# Patient Record
Sex: Female | Born: 1980 | Race: White | Hispanic: No | Marital: Single | Smoking: Never smoker
Health system: Southern US, Community
[De-identification: ages and names within clinical notes are randomized; demographics above are authoritative.]

## PROBLEM LIST (undated history)

## (undated) ENCOUNTER — Ambulatory Visit: Source: Home / Self Care

## (undated) DIAGNOSIS — E559 Vitamin D deficiency, unspecified: Secondary | ICD-10-CM

## (undated) DIAGNOSIS — R569 Unspecified convulsions: Secondary | ICD-10-CM

## (undated) DIAGNOSIS — E785 Hyperlipidemia, unspecified: Secondary | ICD-10-CM

## (undated) DIAGNOSIS — R29898 Other symptoms and signs involving the musculoskeletal system: Secondary | ICD-10-CM

## (undated) DIAGNOSIS — I10 Essential (primary) hypertension: Secondary | ICD-10-CM

## (undated) DIAGNOSIS — R443 Hallucinations, unspecified: Secondary | ICD-10-CM

## (undated) DIAGNOSIS — F209 Schizophrenia, unspecified: Secondary | ICD-10-CM

## (undated) DIAGNOSIS — K219 Gastro-esophageal reflux disease without esophagitis: Secondary | ICD-10-CM

## (undated) DIAGNOSIS — R454 Irritability and anger: Secondary | ICD-10-CM

## (undated) DIAGNOSIS — F70 Mild intellectual disabilities: Secondary | ICD-10-CM

## (undated) DIAGNOSIS — E119 Type 2 diabetes mellitus without complications: Secondary | ICD-10-CM

## (undated) HISTORY — DX: Gastro-esophageal reflux disease without esophagitis: K21.9

## (undated) HISTORY — PX: NO PAST SURGERIES: SHX2092

## (undated) HISTORY — DX: Hyperlipidemia, unspecified: E78.5

## (undated) HISTORY — DX: Vitamin D deficiency, unspecified: E55.9

---
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
## 2001-05-22 ENCOUNTER — Emergency Department (HOSPITAL_COMMUNITY): Admission: EM | Admit: 2001-05-22 | Discharge: 2001-05-22 | Payer: Self-pay | Admitting: Emergency Medicine

## 2001-08-22 ENCOUNTER — Emergency Department (HOSPITAL_COMMUNITY): Admission: EM | Admit: 2001-08-22 | Discharge: 2001-08-22 | Payer: Self-pay | Admitting: Emergency Medicine

## 2001-08-22 ENCOUNTER — Encounter: Payer: Self-pay | Admitting: Emergency Medicine

## 2001-11-30 ENCOUNTER — Emergency Department (HOSPITAL_COMMUNITY): Admission: EM | Admit: 2001-11-30 | Discharge: 2001-11-30 | Payer: Self-pay

## 2001-12-19 ENCOUNTER — Emergency Department (HOSPITAL_COMMUNITY): Admission: EM | Admit: 2001-12-19 | Discharge: 2001-12-20 | Payer: Self-pay | Admitting: Emergency Medicine

## 2001-12-23 ENCOUNTER — Emergency Department (HOSPITAL_COMMUNITY): Admission: EM | Admit: 2001-12-23 | Discharge: 2001-12-23 | Payer: Self-pay | Admitting: Emergency Medicine

## 2002-08-07 ENCOUNTER — Emergency Department (HOSPITAL_COMMUNITY): Admission: EM | Admit: 2002-08-07 | Discharge: 2002-08-07 | Payer: Self-pay | Admitting: Emergency Medicine

## 2002-09-05 ENCOUNTER — Emergency Department (HOSPITAL_COMMUNITY): Admission: EM | Admit: 2002-09-05 | Discharge: 2002-09-05 | Payer: Self-pay | Admitting: Emergency Medicine

## 2003-10-09 ENCOUNTER — Other Ambulatory Visit: Admission: RE | Admit: 2003-10-09 | Discharge: 2003-10-09 | Payer: Self-pay | Admitting: Family Medicine

## 2003-12-13 ENCOUNTER — Encounter: Admission: RE | Admit: 2003-12-13 | Discharge: 2004-03-12 | Payer: Self-pay | Admitting: Family Medicine

## 2004-05-15 ENCOUNTER — Ambulatory Visit: Payer: Self-pay | Admitting: Family Medicine

## 2004-08-12 ENCOUNTER — Ambulatory Visit: Payer: Self-pay | Admitting: Family Medicine

## 2004-08-13 ENCOUNTER — Ambulatory Visit: Payer: Self-pay | Admitting: Family Medicine

## 2004-08-25 ENCOUNTER — Ambulatory Visit: Payer: Self-pay | Admitting: Family Medicine

## 2004-08-29 ENCOUNTER — Ambulatory Visit: Payer: Self-pay | Admitting: Family Medicine

## 2004-09-10 ENCOUNTER — Ambulatory Visit: Payer: Self-pay | Admitting: Family Medicine

## 2004-11-04 ENCOUNTER — Ambulatory Visit: Payer: Self-pay | Admitting: Family Medicine

## 2004-11-17 ENCOUNTER — Ambulatory Visit: Payer: Self-pay | Admitting: Family Medicine

## 2005-01-19 ENCOUNTER — Ambulatory Visit: Payer: Self-pay | Admitting: Family Medicine

## 2005-03-19 ENCOUNTER — Ambulatory Visit: Payer: Self-pay | Admitting: Family Medicine

## 2005-04-23 ENCOUNTER — Ambulatory Visit: Payer: Self-pay | Admitting: Family Medicine

## 2005-04-24 ENCOUNTER — Ambulatory Visit: Payer: Self-pay | Admitting: Family Medicine

## 2005-07-20 ENCOUNTER — Ambulatory Visit: Payer: Self-pay | Admitting: Family Medicine

## 2005-07-23 ENCOUNTER — Ambulatory Visit: Payer: Self-pay | Admitting: Family Medicine

## 2005-10-12 ENCOUNTER — Ambulatory Visit: Payer: Self-pay | Admitting: Family Medicine

## 2005-11-04 ENCOUNTER — Ambulatory Visit: Payer: Self-pay | Admitting: Family Medicine

## 2005-11-19 ENCOUNTER — Ambulatory Visit: Payer: Self-pay | Admitting: Family Medicine

## 2005-12-28 ENCOUNTER — Emergency Department (HOSPITAL_COMMUNITY): Admission: EM | Admit: 2005-12-28 | Discharge: 2005-12-28 | Payer: Self-pay | Admitting: Emergency Medicine

## 2006-01-04 ENCOUNTER — Ambulatory Visit: Payer: Self-pay | Admitting: Family Medicine

## 2006-02-23 ENCOUNTER — Ambulatory Visit: Payer: Self-pay | Admitting: Family Medicine

## 2006-03-29 ENCOUNTER — Ambulatory Visit: Payer: Self-pay | Admitting: Family Medicine

## 2006-06-21 ENCOUNTER — Ambulatory Visit: Payer: Self-pay | Admitting: Family Medicine

## 2006-08-12 ENCOUNTER — Ambulatory Visit: Payer: Self-pay | Admitting: Family Medicine

## 2006-09-13 ENCOUNTER — Ambulatory Visit: Payer: Self-pay | Admitting: Family Medicine

## 2006-11-08 ENCOUNTER — Ambulatory Visit: Payer: Self-pay | Admitting: Family Medicine

## 2006-11-16 ENCOUNTER — Ambulatory Visit: Payer: Self-pay | Admitting: Family Medicine

## 2006-12-06 ENCOUNTER — Ambulatory Visit: Payer: Self-pay | Admitting: Family Medicine

## 2006-12-27 ENCOUNTER — Other Ambulatory Visit: Admission: RE | Admit: 2006-12-27 | Discharge: 2006-12-27 | Payer: Self-pay | Admitting: Family Medicine

## 2006-12-27 ENCOUNTER — Ambulatory Visit: Payer: Self-pay | Admitting: Family Medicine

## 2006-12-27 ENCOUNTER — Encounter (INDEPENDENT_AMBULATORY_CARE_PROVIDER_SITE_OTHER): Payer: Self-pay | Admitting: Family Medicine

## 2007-01-08 DIAGNOSIS — I10 Essential (primary) hypertension: Secondary | ICD-10-CM | POA: Insufficient documentation

## 2007-01-08 DIAGNOSIS — E785 Hyperlipidemia, unspecified: Secondary | ICD-10-CM

## 2007-01-08 DIAGNOSIS — G40909 Epilepsy, unspecified, not intractable, without status epilepticus: Secondary | ICD-10-CM | POA: Insufficient documentation

## 2007-01-08 DIAGNOSIS — R569 Unspecified convulsions: Secondary | ICD-10-CM | POA: Insufficient documentation

## 2007-02-28 ENCOUNTER — Ambulatory Visit: Payer: Self-pay | Admitting: Family Medicine

## 2007-05-23 ENCOUNTER — Ambulatory Visit: Payer: Self-pay | Admitting: Family Medicine

## 2007-07-07 ENCOUNTER — Encounter (INDEPENDENT_AMBULATORY_CARE_PROVIDER_SITE_OTHER): Payer: Self-pay | Admitting: Family Medicine

## 2007-08-16 ENCOUNTER — Ambulatory Visit: Payer: Self-pay | Admitting: Family Medicine

## 2007-08-23 ENCOUNTER — Encounter (INDEPENDENT_AMBULATORY_CARE_PROVIDER_SITE_OTHER): Payer: Self-pay | Admitting: Family Medicine

## 2007-09-22 ENCOUNTER — Encounter (INDEPENDENT_AMBULATORY_CARE_PROVIDER_SITE_OTHER): Payer: Self-pay | Admitting: Family Medicine

## 2007-11-04 ENCOUNTER — Emergency Department (HOSPITAL_COMMUNITY): Admission: EM | Admit: 2007-11-04 | Discharge: 2007-11-04 | Payer: Self-pay | Admitting: Family Medicine

## 2007-11-09 ENCOUNTER — Ambulatory Visit: Payer: Self-pay | Admitting: Family Medicine

## 2007-11-24 ENCOUNTER — Encounter (INDEPENDENT_AMBULATORY_CARE_PROVIDER_SITE_OTHER): Payer: Self-pay | Admitting: Nurse Practitioner

## 2007-12-12 ENCOUNTER — Ambulatory Visit: Payer: Self-pay | Admitting: Family Medicine

## 2007-12-12 LAB — CONVERTED CEMR LAB
AST: 17 units/L (ref 0–37)
Alkaline Phosphatase: 60 units/L (ref 39–117)
Basophils Absolute: 0 10*3/uL (ref 0.0–0.1)
Basophils Relative: 0 % (ref 0–1)
CO2: 19 meq/L (ref 19–32)
Cholesterol: 185 mg/dL (ref 0–200)
Eosinophils Absolute: 0.1 10*3/uL (ref 0.0–0.7)
Glucose, Urine, Semiquant: 250
HCT: 38.1 % (ref 36.0–46.0)
Hemoglobin: 12.2 g/dL (ref 12.0–15.0)
MCV: 83.9 fL (ref 78.0–100.0)
Microalb, Ur: 0.32 mg/dL (ref 0.00–1.89)
Monocytes Absolute: 0.5 10*3/uL (ref 0.1–1.0)
Neutro Abs: 6 10*3/uL (ref 1.7–7.7)
Neutrophils Relative %: 62 % (ref 43–77)
Platelets: 285 10*3/uL (ref 150–400)
Protein, U semiquant: NEGATIVE
RDW: 14.2 % (ref 11.5–15.5)
TSH: 1.906 microintl units/mL (ref 0.350–5.50)
Total Bilirubin: 0.4 mg/dL (ref 0.3–1.2)
Triglycerides: 144 mg/dL (ref <150)
WBC: 9.6 10*3/uL (ref 4.0–10.5)
pH: 6.5

## 2008-01-05 ENCOUNTER — Telehealth (INDEPENDENT_AMBULATORY_CARE_PROVIDER_SITE_OTHER): Payer: Self-pay | Admitting: Family Medicine

## 2008-02-02 ENCOUNTER — Ambulatory Visit: Payer: Self-pay | Admitting: Family Medicine

## 2008-04-24 ENCOUNTER — Telehealth (INDEPENDENT_AMBULATORY_CARE_PROVIDER_SITE_OTHER): Payer: Self-pay | Admitting: Family Medicine

## 2008-04-27 ENCOUNTER — Ambulatory Visit: Payer: Self-pay | Admitting: Family Medicine

## 2008-05-07 ENCOUNTER — Telehealth (INDEPENDENT_AMBULATORY_CARE_PROVIDER_SITE_OTHER): Payer: Self-pay | Admitting: Family Medicine

## 2008-05-08 ENCOUNTER — Encounter (INDEPENDENT_AMBULATORY_CARE_PROVIDER_SITE_OTHER): Payer: Self-pay | Admitting: Family Medicine

## 2008-06-04 ENCOUNTER — Emergency Department (HOSPITAL_COMMUNITY): Admission: EM | Admit: 2008-06-04 | Discharge: 2008-06-04 | Payer: Self-pay | Admitting: Family Medicine

## 2008-07-19 ENCOUNTER — Ambulatory Visit: Payer: Self-pay | Admitting: Family Medicine

## 2008-07-26 ENCOUNTER — Telehealth (INDEPENDENT_AMBULATORY_CARE_PROVIDER_SITE_OTHER): Payer: Self-pay | Admitting: *Deleted

## 2008-08-09 ENCOUNTER — Encounter (INDEPENDENT_AMBULATORY_CARE_PROVIDER_SITE_OTHER): Payer: Self-pay | Admitting: *Deleted

## 2008-08-28 ENCOUNTER — Telehealth (INDEPENDENT_AMBULATORY_CARE_PROVIDER_SITE_OTHER): Payer: Self-pay | Admitting: Family Medicine

## 2008-10-10 ENCOUNTER — Ambulatory Visit: Payer: Self-pay | Admitting: Family Medicine

## 2008-10-25 ENCOUNTER — Emergency Department (HOSPITAL_COMMUNITY): Admission: EM | Admit: 2008-10-25 | Discharge: 2008-10-25 | Payer: Self-pay | Admitting: Emergency Medicine

## 2008-11-11 ENCOUNTER — Emergency Department (HOSPITAL_BASED_OUTPATIENT_CLINIC_OR_DEPARTMENT_OTHER): Admission: EM | Admit: 2008-11-11 | Discharge: 2008-11-11 | Payer: Self-pay | Admitting: Emergency Medicine

## 2008-11-11 ENCOUNTER — Encounter (INDEPENDENT_AMBULATORY_CARE_PROVIDER_SITE_OTHER): Payer: Self-pay | Admitting: Internal Medicine

## 2008-11-15 ENCOUNTER — Encounter (INDEPENDENT_AMBULATORY_CARE_PROVIDER_SITE_OTHER): Payer: Self-pay | Admitting: Family Medicine

## 2008-12-17 ENCOUNTER — Ambulatory Visit: Payer: Self-pay | Admitting: Family Medicine

## 2008-12-17 DIAGNOSIS — B353 Tinea pedis: Secondary | ICD-10-CM

## 2008-12-17 LAB — CONVERTED CEMR LAB: Blood Glucose, Fingerstick: 207

## 2009-01-01 ENCOUNTER — Ambulatory Visit: Payer: Self-pay | Admitting: Family Medicine

## 2009-01-01 LAB — CONVERTED CEMR LAB
Bilirubin Urine: NEGATIVE
Blood in Urine, dipstick: NEGATIVE
Nitrite: NEGATIVE
Protein, U semiquant: NEGATIVE
Specific Gravity, Urine: 1.015
Urobilinogen, UA: 0.2
pH: 5

## 2009-01-07 ENCOUNTER — Telehealth (INDEPENDENT_AMBULATORY_CARE_PROVIDER_SITE_OTHER): Payer: Self-pay | Admitting: Family Medicine

## 2009-01-21 ENCOUNTER — Ambulatory Visit: Payer: Self-pay | Admitting: Family Medicine

## 2009-01-22 ENCOUNTER — Encounter (INDEPENDENT_AMBULATORY_CARE_PROVIDER_SITE_OTHER): Payer: Self-pay | Admitting: Family Medicine

## 2009-01-25 ENCOUNTER — Telehealth (INDEPENDENT_AMBULATORY_CARE_PROVIDER_SITE_OTHER): Payer: Self-pay | Admitting: Family Medicine

## 2009-01-29 ENCOUNTER — Encounter (INDEPENDENT_AMBULATORY_CARE_PROVIDER_SITE_OTHER): Payer: Self-pay | Admitting: Family Medicine

## 2009-02-04 ENCOUNTER — Encounter (INDEPENDENT_AMBULATORY_CARE_PROVIDER_SITE_OTHER): Payer: Self-pay | Admitting: Family Medicine

## 2009-02-20 ENCOUNTER — Telehealth (INDEPENDENT_AMBULATORY_CARE_PROVIDER_SITE_OTHER): Payer: Self-pay | Admitting: Internal Medicine

## 2009-03-25 ENCOUNTER — Ambulatory Visit: Payer: Self-pay | Admitting: Internal Medicine

## 2009-05-18 ENCOUNTER — Emergency Department (HOSPITAL_COMMUNITY): Admission: EM | Admit: 2009-05-18 | Discharge: 2009-05-19 | Payer: Self-pay | Admitting: Emergency Medicine

## 2009-05-19 ENCOUNTER — Emergency Department (HOSPITAL_COMMUNITY): Admission: EM | Admit: 2009-05-19 | Discharge: 2009-05-19 | Payer: Self-pay | Admitting: Emergency Medicine

## 2009-05-30 ENCOUNTER — Emergency Department (HOSPITAL_COMMUNITY): Admission: EM | Admit: 2009-05-30 | Discharge: 2009-05-30 | Payer: Self-pay | Admitting: Family Medicine

## 2009-06-03 ENCOUNTER — Other Ambulatory Visit: Payer: Self-pay

## 2009-06-04 ENCOUNTER — Other Ambulatory Visit: Payer: Self-pay | Admitting: Emergency Medicine

## 2009-06-05 ENCOUNTER — Inpatient Hospital Stay (HOSPITAL_COMMUNITY): Admission: AD | Admit: 2009-06-05 | Discharge: 2009-06-10 | Payer: Self-pay | Admitting: Psychiatry

## 2009-06-05 ENCOUNTER — Ambulatory Visit: Payer: Self-pay | Admitting: Psychiatry

## 2009-06-05 ENCOUNTER — Other Ambulatory Visit: Payer: Self-pay | Admitting: Emergency Medicine

## 2009-06-07 ENCOUNTER — Encounter (INDEPENDENT_AMBULATORY_CARE_PROVIDER_SITE_OTHER): Payer: Self-pay | Admitting: Internal Medicine

## 2009-06-17 ENCOUNTER — Ambulatory Visit: Payer: Self-pay | Admitting: Nurse Practitioner

## 2009-06-19 ENCOUNTER — Emergency Department (HOSPITAL_COMMUNITY): Admission: EM | Admit: 2009-06-19 | Discharge: 2009-06-20 | Payer: Self-pay | Admitting: Emergency Medicine

## 2009-06-27 ENCOUNTER — Telehealth: Payer: Self-pay | Admitting: Physician Assistant

## 2009-06-27 ENCOUNTER — Emergency Department (HOSPITAL_COMMUNITY): Admission: EM | Admit: 2009-06-27 | Discharge: 2009-06-27 | Payer: Self-pay | Admitting: Emergency Medicine

## 2009-06-29 ENCOUNTER — Inpatient Hospital Stay (HOSPITAL_COMMUNITY): Admission: EM | Admit: 2009-06-29 | Discharge: 2009-07-28 | Payer: Self-pay | Admitting: Emergency Medicine

## 2009-07-26 ENCOUNTER — Encounter: Payer: Self-pay | Admitting: Physician Assistant

## 2009-07-27 ENCOUNTER — Encounter: Payer: Self-pay | Admitting: Physician Assistant

## 2009-07-30 ENCOUNTER — Encounter: Payer: Self-pay | Admitting: Physician Assistant

## 2009-08-03 ENCOUNTER — Encounter: Payer: Self-pay | Admitting: Physician Assistant

## 2009-08-29 ENCOUNTER — Emergency Department (HOSPITAL_COMMUNITY): Admission: EM | Admit: 2009-08-29 | Discharge: 2009-08-29 | Payer: Self-pay | Admitting: Family Medicine

## 2009-08-30 ENCOUNTER — Emergency Department (HOSPITAL_COMMUNITY): Admission: EM | Admit: 2009-08-30 | Discharge: 2009-08-31 | Payer: Self-pay | Admitting: Emergency Medicine

## 2009-09-01 ENCOUNTER — Inpatient Hospital Stay (HOSPITAL_COMMUNITY): Admission: EM | Admit: 2009-09-01 | Discharge: 2009-09-04 | Payer: Self-pay | Admitting: Emergency Medicine

## 2009-09-04 ENCOUNTER — Other Ambulatory Visit: Payer: Self-pay

## 2009-09-04 ENCOUNTER — Encounter: Payer: Self-pay | Admitting: Physician Assistant

## 2009-09-05 ENCOUNTER — Other Ambulatory Visit: Payer: Self-pay | Admitting: Emergency Medicine

## 2009-09-05 ENCOUNTER — Other Ambulatory Visit: Payer: Self-pay

## 2009-09-05 ENCOUNTER — Encounter: Payer: Self-pay | Admitting: Physician Assistant

## 2009-09-05 ENCOUNTER — Other Ambulatory Visit (HOSPITAL_COMMUNITY): Payer: Self-pay | Admitting: Emergency Medicine

## 2009-09-06 ENCOUNTER — Inpatient Hospital Stay (HOSPITAL_COMMUNITY): Admission: EM | Admit: 2009-09-06 | Discharge: 2009-09-12 | Payer: Self-pay | Admitting: Internal Medicine

## 2009-09-06 ENCOUNTER — Other Ambulatory Visit (HOSPITAL_COMMUNITY): Payer: Self-pay | Admitting: Emergency Medicine

## 2009-09-12 ENCOUNTER — Encounter: Payer: Self-pay | Admitting: Physician Assistant

## 2009-09-13 ENCOUNTER — Telehealth: Payer: Self-pay | Admitting: Physician Assistant

## 2009-09-15 ENCOUNTER — Emergency Department (HOSPITAL_COMMUNITY): Admission: EM | Admit: 2009-09-15 | Discharge: 2009-09-15 | Payer: Self-pay | Admitting: Emergency Medicine

## 2009-09-16 ENCOUNTER — Emergency Department (HOSPITAL_COMMUNITY): Admission: EM | Admit: 2009-09-16 | Discharge: 2009-09-17 | Payer: Self-pay | Admitting: Emergency Medicine

## 2009-09-22 ENCOUNTER — Encounter: Payer: Self-pay | Admitting: Physician Assistant

## 2009-09-26 ENCOUNTER — Ambulatory Visit: Payer: Self-pay | Admitting: Physician Assistant

## 2009-09-26 DIAGNOSIS — K3189 Other diseases of stomach and duodenum: Secondary | ICD-10-CM

## 2009-09-26 DIAGNOSIS — K5900 Constipation, unspecified: Secondary | ICD-10-CM | POA: Insufficient documentation

## 2009-09-26 DIAGNOSIS — R1013 Epigastric pain: Secondary | ICD-10-CM

## 2009-09-26 LAB — CONVERTED CEMR LAB: Blood Glucose, Fingerstick: 196

## 2009-09-27 ENCOUNTER — Telehealth: Payer: Self-pay | Admitting: Physician Assistant

## 2009-09-27 ENCOUNTER — Encounter: Payer: Self-pay | Admitting: Physician Assistant

## 2009-09-27 LAB — CONVERTED CEMR LAB
ALT: 14 units/L (ref 0–35)
Alkaline Phosphatase: 52 units/L (ref 39–117)
CO2: 22 meq/L (ref 19–32)
Chloride: 105 meq/L (ref 96–112)
Creatinine, Ser: 0.62 mg/dL (ref 0.40–1.20)
Eosinophils Relative: 1 % (ref 0–5)
Glucose, Bld: 176 mg/dL — ABNORMAL HIGH (ref 70–99)
HCT: 39.4 % (ref 36.0–46.0)
Hemoglobin: 12.4 g/dL (ref 12.0–15.0)
Lymphocytes Relative: 37 % (ref 12–46)
MCHC: 31.5 g/dL (ref 30.0–36.0)
Platelets: 190 10*3/uL (ref 150–400)
Potassium: 4.3 meq/L (ref 3.5–5.3)
RBC: 4.31 M/uL (ref 3.87–5.11)
Valproic Acid Lvl: 57.9 ug/mL (ref 50.0–100.0)
WBC: 11.1 10*3/uL — ABNORMAL HIGH (ref 4.0–10.5)

## 2009-10-02 ENCOUNTER — Telehealth: Payer: Self-pay | Admitting: Physician Assistant

## 2009-10-28 ENCOUNTER — Ambulatory Visit: Payer: Self-pay | Admitting: Physician Assistant

## 2009-10-28 DIAGNOSIS — L293 Anogenital pruritus, unspecified: Secondary | ICD-10-CM

## 2009-10-28 DIAGNOSIS — B373 Candidiasis of vulva and vagina: Secondary | ICD-10-CM

## 2009-10-28 LAB — CONVERTED CEMR LAB
KOH Prep: NEGATIVE
Whiff Test: NEGATIVE

## 2009-10-29 ENCOUNTER — Encounter: Payer: Self-pay | Admitting: Physician Assistant

## 2009-10-29 ENCOUNTER — Telehealth: Payer: Self-pay | Admitting: Physician Assistant

## 2009-11-01 ENCOUNTER — Encounter: Payer: Self-pay | Admitting: Physician Assistant

## 2009-11-13 ENCOUNTER — Ambulatory Visit: Payer: Self-pay | Admitting: Physician Assistant

## 2009-11-14 ENCOUNTER — Encounter: Payer: Self-pay | Admitting: Physician Assistant

## 2009-11-14 LAB — CONVERTED CEMR LAB
Cholesterol: 222 mg/dL — ABNORMAL HIGH (ref 0–200)
Triglycerides: 169 mg/dL — ABNORMAL HIGH (ref <150)

## 2009-11-15 ENCOUNTER — Encounter: Payer: Self-pay | Admitting: Physician Assistant

## 2009-11-18 ENCOUNTER — Telehealth: Payer: Self-pay | Admitting: Physician Assistant

## 2009-11-20 ENCOUNTER — Telehealth: Payer: Self-pay | Admitting: Physician Assistant

## 2009-11-22 ENCOUNTER — Encounter (INDEPENDENT_AMBULATORY_CARE_PROVIDER_SITE_OTHER): Payer: Self-pay | Admitting: *Deleted

## 2009-12-05 ENCOUNTER — Ambulatory Visit: Payer: Self-pay | Admitting: Physician Assistant

## 2009-12-05 LAB — CONVERTED CEMR LAB: Beta hcg, urine, semiquantitative: NEGATIVE

## 2009-12-19 ENCOUNTER — Telehealth: Payer: Self-pay | Admitting: Physician Assistant

## 2009-12-19 ENCOUNTER — Ambulatory Visit: Payer: Self-pay | Admitting: Physician Assistant

## 2009-12-19 DIAGNOSIS — J309 Allergic rhinitis, unspecified: Secondary | ICD-10-CM | POA: Insufficient documentation

## 2009-12-19 LAB — CONVERTED CEMR LAB: Whiff Test: NEGATIVE

## 2009-12-20 ENCOUNTER — Encounter: Payer: Self-pay | Admitting: Physician Assistant

## 2010-01-20 ENCOUNTER — Other Ambulatory Visit: Admission: RE | Admit: 2010-01-20 | Discharge: 2010-01-20 | Payer: Self-pay | Admitting: Internal Medicine

## 2010-01-20 ENCOUNTER — Ambulatory Visit: Payer: Self-pay | Admitting: Physician Assistant

## 2010-01-20 DIAGNOSIS — N760 Acute vaginitis: Secondary | ICD-10-CM | POA: Insufficient documentation

## 2010-01-20 LAB — CONVERTED CEMR LAB
Blood Glucose, Fingerstick: 125
Whiff Test: NEGATIVE

## 2010-01-21 LAB — CONVERTED CEMR LAB
Chlamydia, DNA Probe: NEGATIVE
GC Probe Amp, Genital: NEGATIVE
Valproic Acid Lvl: 96.2 ug/mL (ref 50.0–100.0)

## 2010-01-24 ENCOUNTER — Encounter: Payer: Self-pay | Admitting: Physician Assistant

## 2010-02-26 ENCOUNTER — Ambulatory Visit: Payer: Self-pay | Admitting: Physician Assistant

## 2010-02-28 ENCOUNTER — Encounter: Payer: Self-pay | Admitting: Physician Assistant

## 2010-03-02 ENCOUNTER — Telehealth: Payer: Self-pay | Admitting: Physician Assistant

## 2010-03-09 IMAGING — CR DG ABDOMEN 1V
1 series · 1 of 1 positions shown · non-contrast
Comparison: Abdominal radiograph performed 06/30/2009

CLINICAL DATA: Abdominal pain and vomiting; low grade fever.

ABDOMEN - 1 VIEW

[view not recorded]
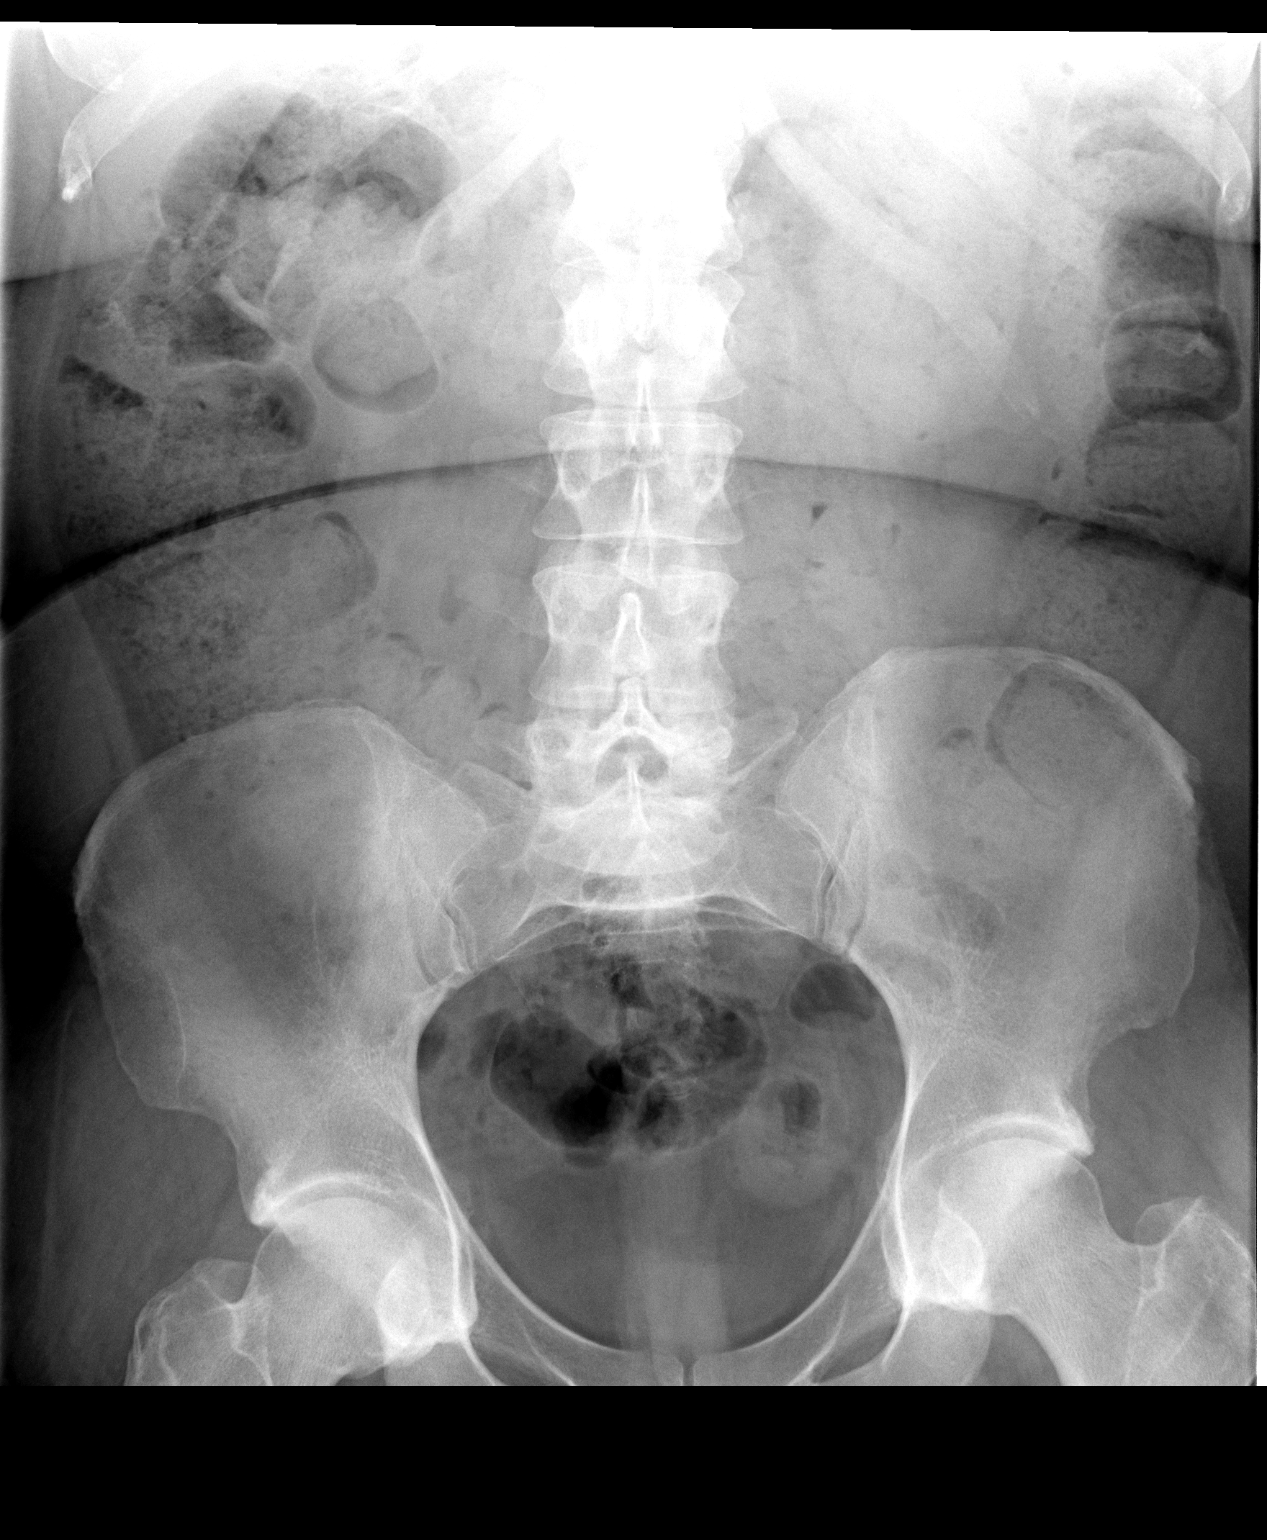

[1 of 1 positions shown; findings below may reference images not displayed]

FINDINGS: A large amount of stool is noted throughout the colon,
compatible with significant constipation.  No significant small
bowel dilatation or fecalization is identified to suggest small
bowel obstruction.  No free intra-abdominal air is seen, although
evaluation is limited on a single supine view.

The visualized osseous structures are within normal limits; the
sacroiliac joints are unremarkable in appearance.
IMPRESSION: Large amount of stool noted throughout the colon, compatible with
significant constipation.  No definite evidence for small bowel
obstruction.

## 2010-03-10 ENCOUNTER — Other Ambulatory Visit: Payer: Self-pay | Admitting: Emergency Medicine

## 2010-03-10 ENCOUNTER — Other Ambulatory Visit: Payer: Self-pay

## 2010-03-11 ENCOUNTER — Emergency Department (HOSPITAL_COMMUNITY): Admission: EM | Admit: 2010-03-11 | Discharge: 2010-03-11 | Payer: Self-pay | Admitting: Emergency Medicine

## 2010-03-11 IMAGING — CT CT PELVIS W/ CM
2 of 4 series · 14 of 32 positions shown, 19 images · IV contrast (100 ML OMNI 300)
Comparison: None.

CT ABDOMEN

CLINICAL DATA: Abdominal pain, constipation and vomiting.

CT ABDOMEN AND PELVIS WITH CONTRAST
TECHNIQUE: Multidetector CT imaging of the abdomen and pelvis was
performed using the standard protocol following bolus
administration of intravenous contrast.
Contrast: 100 ml Hmnipaque-8DD IV.  The patient refused to drink
oral contrast.

[Series 2: routine abdomen · axial · 0.76mm/px · z∈[-450,-85]mm · 7 of 99 slices shown, 12 images]
[im 13/99  soft-tissue]
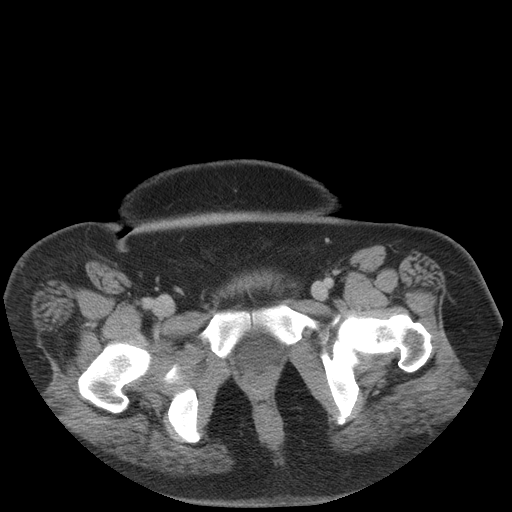
[im 13/99  bone]
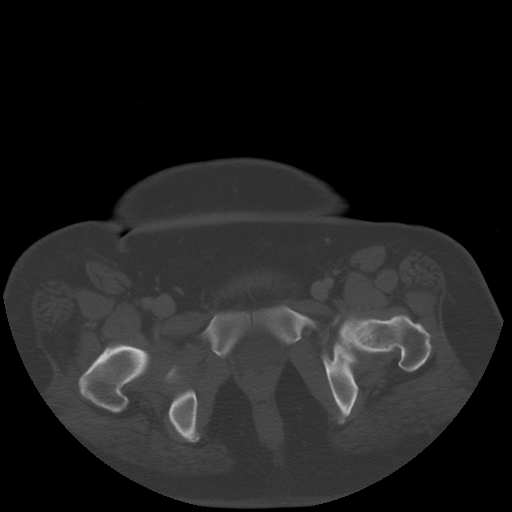
[im 25/99  soft-tissue]
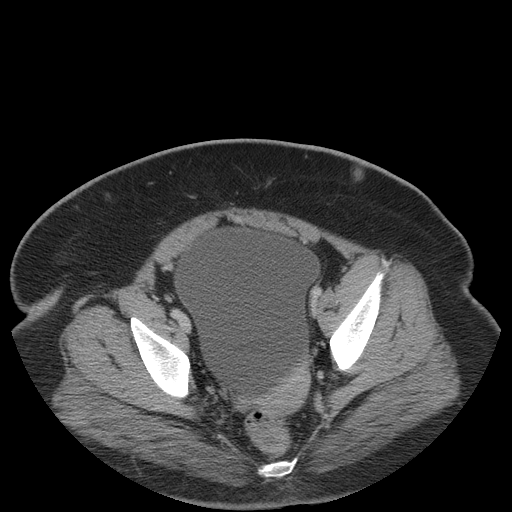
[im 37/99  soft-tissue]
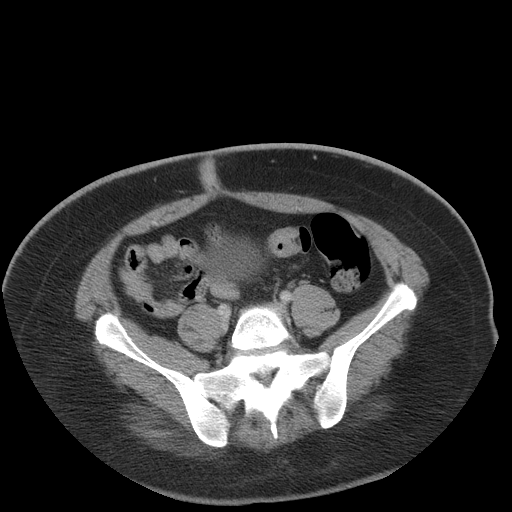
[im 50/99  soft-tissue]
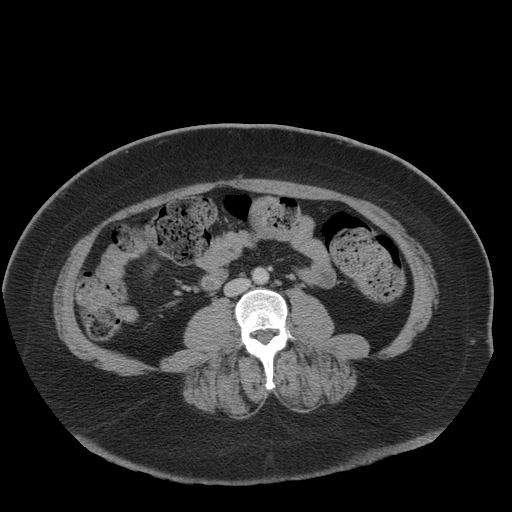
[im 50/99  lung]
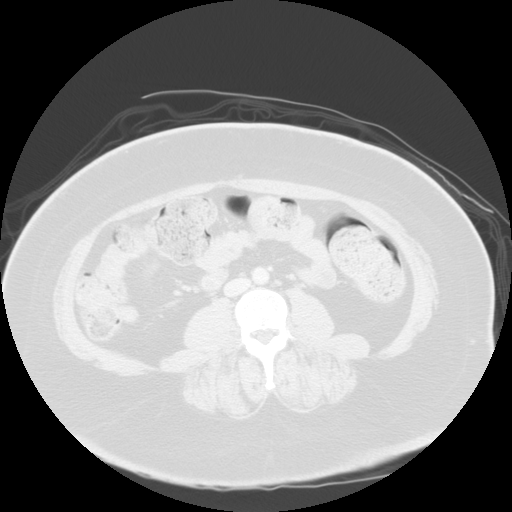
[im 62/99  soft-tissue]
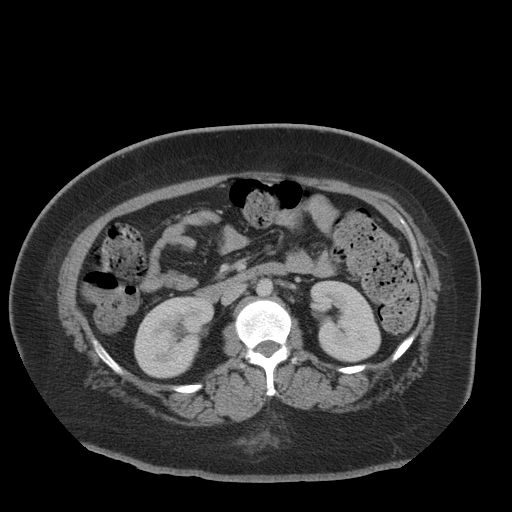
[im 62/99  lung]
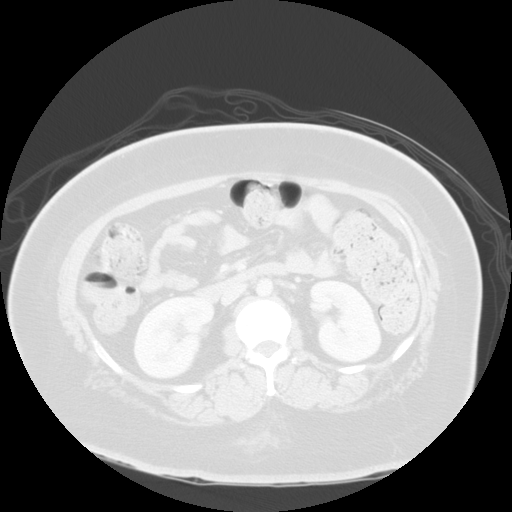
[im 74/99  soft-tissue]
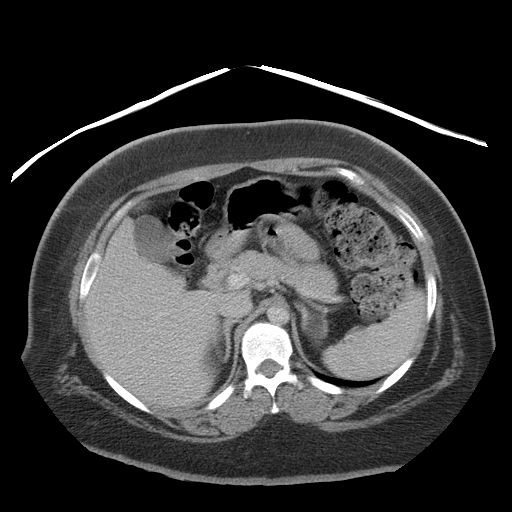
[im 74/99  lung]
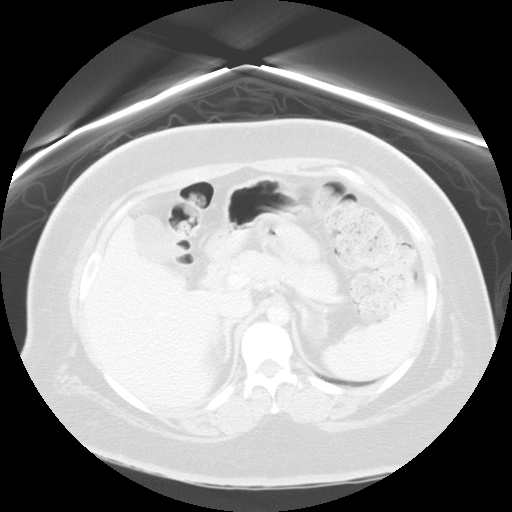
[im 86/99  soft-tissue]
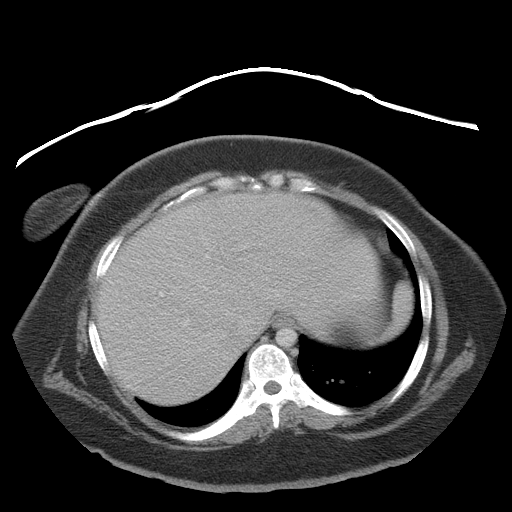
[im 86/99  lung]
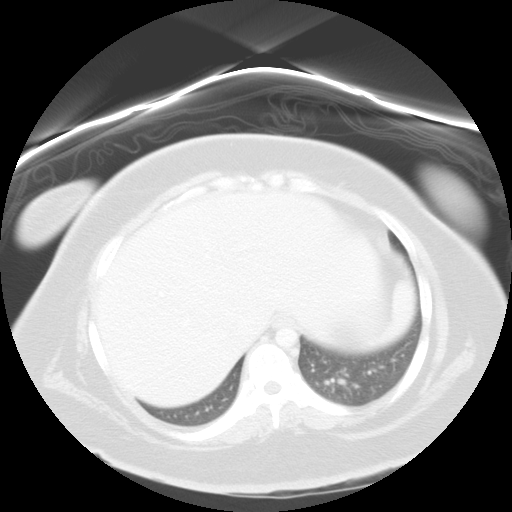

[Series 400: sag · sagittal · 0.98mm/px · 7 of 124 slices shown]
[im 13/124  soft-tissue]
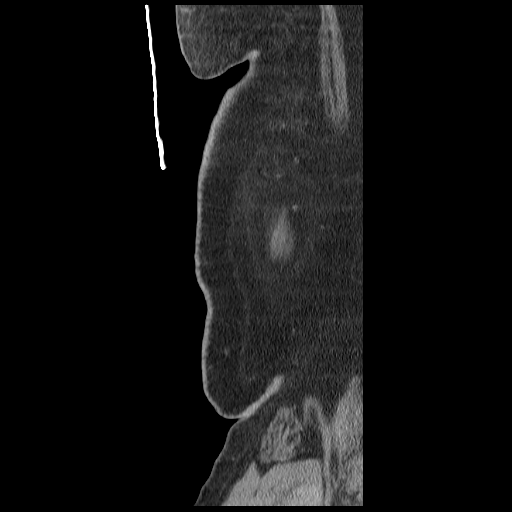
[im 25/124  soft-tissue]
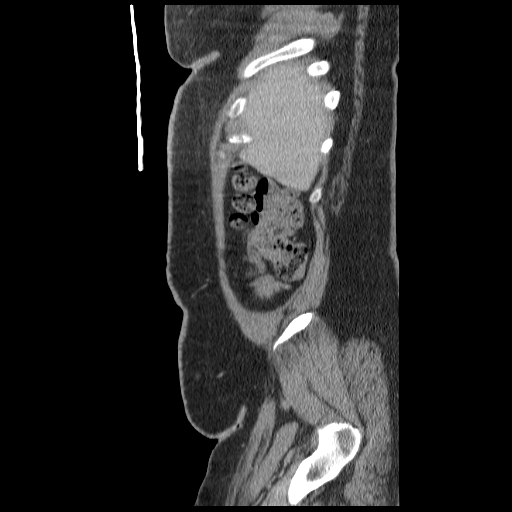
[im 37/124  soft-tissue]
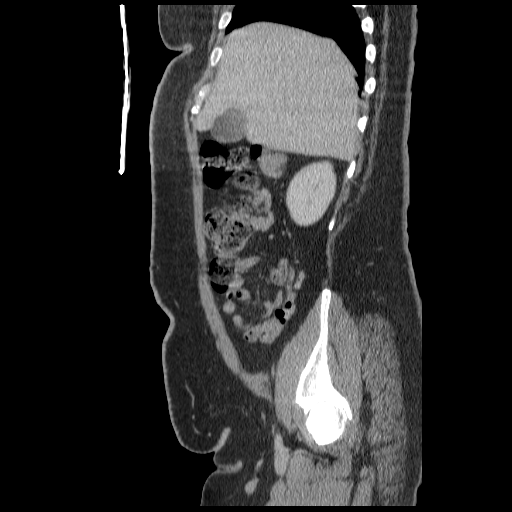
[im 50/124  soft-tissue]
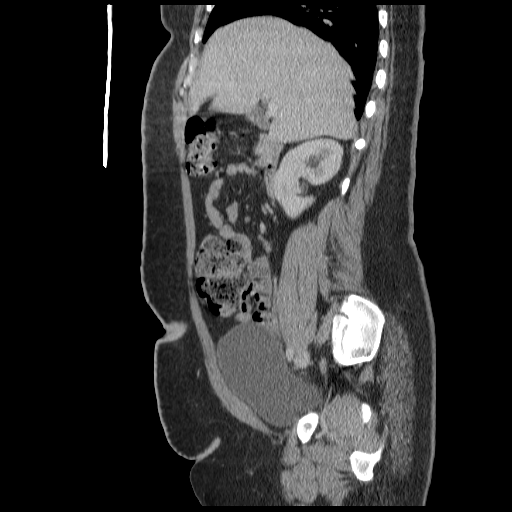
[im 74/124  soft-tissue]
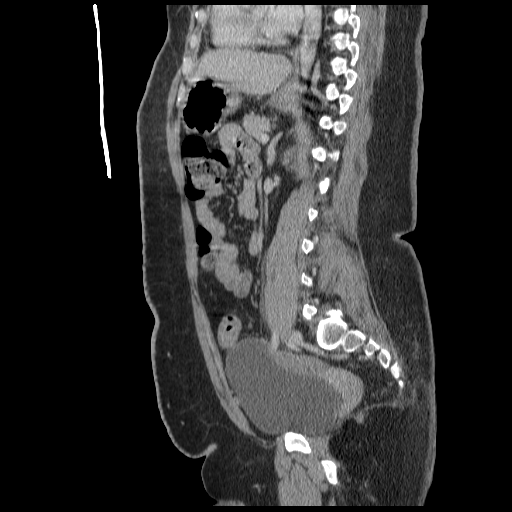
[im 87/124  soft-tissue]
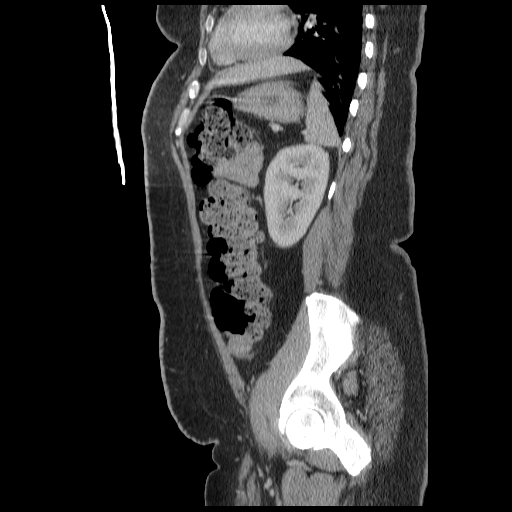
[im 99/124  soft-tissue]
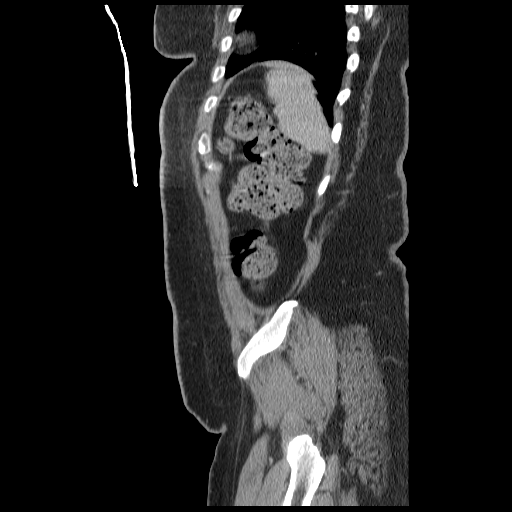

[14 of 32 positions shown; findings below may reference images not displayed]

FINDINGS: The liver, gallbladder, pancreas, spleen, adrenal glands
and kidneys are unremarkable.

Moderate fecal material is present throughout the colon without
evidence of bowel obstruction.  Small bowel is of normal caliber.
No inflammatory process, free fluid or ascites is present.  No
evidence of enlarged lymph nodes, hernia or abnormal
calcifications.  Bony structures are unremarkable.
IMPRESSION: No acute findings.  Moderate fecal material throughout the colon.

CT PELVIS
FINDINGS: The bladder is very distended with urine.  No evidence
of free fluid or inflammation.  No hernias are identified.  No
abnormal calcifications.  Pelvic bowel loops are unremarkable.
IMPRESSION: Significant distention of urinary bladder.

## 2010-03-15 ENCOUNTER — Other Ambulatory Visit: Payer: Self-pay | Admitting: Emergency Medicine

## 2010-03-16 ENCOUNTER — Other Ambulatory Visit: Payer: Self-pay | Admitting: Emergency Medicine

## 2010-03-16 ENCOUNTER — Inpatient Hospital Stay (HOSPITAL_COMMUNITY): Admission: RE | Admit: 2010-03-16 | Discharge: 2010-03-19 | Payer: Self-pay | Admitting: Psychiatry

## 2010-03-16 ENCOUNTER — Ambulatory Visit: Payer: Self-pay | Admitting: Psychiatry

## 2010-03-31 ENCOUNTER — Emergency Department (HOSPITAL_COMMUNITY): Admission: EM | Admit: 2010-03-31 | Discharge: 2010-03-31 | Payer: Self-pay | Admitting: Emergency Medicine

## 2010-04-01 ENCOUNTER — Inpatient Hospital Stay (HOSPITAL_COMMUNITY): Admission: RE | Admit: 2010-04-01 | Discharge: 2010-04-02 | Payer: Self-pay | Admitting: Psychiatry

## 2010-04-15 ENCOUNTER — Telehealth: Payer: Self-pay | Admitting: Physician Assistant

## 2010-04-23 ENCOUNTER — Encounter (INDEPENDENT_AMBULATORY_CARE_PROVIDER_SITE_OTHER): Payer: Self-pay | Admitting: Nurse Practitioner

## 2010-04-28 ENCOUNTER — Ambulatory Visit: Payer: Self-pay | Admitting: Physician Assistant

## 2010-04-28 DIAGNOSIS — L84 Corns and callosities: Secondary | ICD-10-CM

## 2010-04-28 LAB — CONVERTED CEMR LAB
ALT: 12 units/L (ref 0–35)
AST: 16 units/L (ref 0–37)
Albumin: 4 g/dL (ref 3.5–5.2)
Alkaline Phosphatase: 42 units/L (ref 39–117)
BUN: 10 mg/dL (ref 6–23)
Chloride: 102 meq/L (ref 96–112)
Creatinine, Ser: 0.54 mg/dL (ref 0.40–1.20)
Glucose, Bld: 112 mg/dL — ABNORMAL HIGH (ref 70–99)
Potassium: 4.7 meq/L (ref 3.5–5.3)
Sodium: 137 meq/L (ref 135–145)
Total Bilirubin: 0.4 mg/dL (ref 0.3–1.2)

## 2010-05-01 ENCOUNTER — Encounter: Payer: Self-pay | Admitting: Physician Assistant

## 2010-05-09 ENCOUNTER — Emergency Department (HOSPITAL_COMMUNITY): Admission: EM | Admit: 2010-05-09 | Discharge: 2010-05-11 | Payer: Self-pay | Admitting: Emergency Medicine

## 2010-05-22 ENCOUNTER — Telehealth: Payer: Self-pay | Admitting: Physician Assistant

## 2010-05-22 ENCOUNTER — Encounter: Payer: Self-pay | Admitting: Physician Assistant

## 2010-05-22 ENCOUNTER — Ambulatory Visit: Payer: Self-pay | Admitting: Nurse Practitioner

## 2010-05-27 ENCOUNTER — Encounter: Payer: Self-pay | Admitting: Physician Assistant

## 2010-05-27 ENCOUNTER — Ambulatory Visit: Payer: Self-pay | Admitting: Internal Medicine

## 2010-05-31 ENCOUNTER — Encounter: Payer: Self-pay | Admitting: Physician Assistant

## 2010-07-29 ENCOUNTER — Ambulatory Visit: Payer: Self-pay | Admitting: Internal Medicine

## 2010-07-29 ENCOUNTER — Encounter: Payer: Self-pay | Admitting: Physician Assistant

## 2010-07-29 DIAGNOSIS — R300 Dysuria: Secondary | ICD-10-CM

## 2010-07-29 LAB — CONVERTED CEMR LAB
Blood Glucose, Fingerstick: 210
Hgb A1c MFr Bld: 8.5 %

## 2010-08-05 ENCOUNTER — Ambulatory Visit: Payer: Self-pay | Admitting: Internal Medicine

## 2010-08-07 ENCOUNTER — Encounter (INDEPENDENT_AMBULATORY_CARE_PROVIDER_SITE_OTHER): Payer: Self-pay | Admitting: Internal Medicine

## 2010-08-12 ENCOUNTER — Ambulatory Visit: Payer: Self-pay | Admitting: Internal Medicine

## 2010-08-13 ENCOUNTER — Ambulatory Visit: Payer: Self-pay | Admitting: Nurse Practitioner

## 2010-08-14 ENCOUNTER — Encounter (INDEPENDENT_AMBULATORY_CARE_PROVIDER_SITE_OTHER): Payer: Self-pay | Admitting: Internal Medicine

## 2010-08-19 ENCOUNTER — Telehealth (INDEPENDENT_AMBULATORY_CARE_PROVIDER_SITE_OTHER): Payer: Self-pay | Admitting: Internal Medicine

## 2010-09-30 ENCOUNTER — Telehealth (INDEPENDENT_AMBULATORY_CARE_PROVIDER_SITE_OTHER): Payer: Self-pay | Admitting: Internal Medicine

## 2010-10-02 ENCOUNTER — Encounter: Payer: Self-pay | Admitting: Physician Assistant

## 2010-10-07 NOTE — Progress Notes (Signed)
Summary: Diabetic Clinic Referral  Phone Note Outgoing Call   Summary of Call: Please refer to diabetic clinic at [facility] for re-education on diet. Initial call taken by: Tereso Newcomer PA-C,  December 19, 2009 2:59 PM

## 2010-10-07 NOTE — Letter (Signed)
Summary: NUTRITIONIST/SUSIE  NUTRITIONIST/SUSIE   Imported By: Arta Bruce 06/25/2010 14:31:55  _____________________________________________________________________  External Attachment:    Type:   Image     Comment:   External Document

## 2010-10-07 NOTE — Progress Notes (Signed)
Summary: FAX LABS TO [facility]  Phone Note Other Incoming   Reason for Call: Discuss lab or test results Summary of Call: Alexandria Harvey pt [name] CARE GIVER STOPPED BY TO SEE IF WE WILL FAX OVER HER LABS TO [facility] AT [phone] AT [facility] Initial call taken by: Leodis Rains,  September 27, 2009 12:53 PM  Follow-up for Phone Call        done Follow-up by: Armenia Shannon,  September 27, 2009 3:21 PM

## 2010-10-07 NOTE — Assessment & Plan Note (Signed)
Summary: 1 MONTH FU FOR CPP///KT   Vital Signs:  Patient profile:   30 year old female Menstrual status:  depo Height:      62 inches Weight:      188 pounds BMI:     34.51 Temp:     98.0 degrees F oral Pulse rate:   90 / minute Pulse rhythm:   regular Resp:     18 per minute BP sitting:   103 / 67  (left arm) Cuff size:   regular  Vitals Entered By: Armenia Shannon ([name] 20, 2010 9:18 AM) CC: cpp.. pt says the allergies meds don't work..., Hypertension Management Is Patient Diabetic? Yes Pain Assessment Patient in pain? no      CBG Result 125  Does patient need assistance? Functional Status Self care Ambulation Normal   Primary Care Provider:  Tereso Newcomer PA-C  CC:  cpp.. pt says the allergies meds don't work... and Hypertension Management.  History of Present Illness: Here with caretaker:  Meliton Rattan (this is her usual caretaker; prior caretaker was temporary)  Here for CPP. Never had an abnormal pap. She states she is not supposed to get paps. . . did find a note from Dr. Barbaraann Barthel in 2009 that states she is to only get paps every 5 years.  Next was supposed to be due in 2013.  She is not sexually active.  She gets Depo for other reasons. . . not birth control. Adopted . . . no FHx known. Does not take calcium. No cycles with Depo but does have cramps at times. No abnormal bleeding. Does note vaginal odor . . . no itching or burning.  Concerned about allergy meds not working.  States she continues to have a lot of rhinorrhea.  DM: Sugars ranging 110-180.    Hypertension History:      Positive major cardiovascular risk factors include diabetes, hyperlipidemia, and hypertension.  Negative major cardiovascular risk factors include female age less than 9 years old and non-tobacco-user status.    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 Take 1 Tab By Mouth At Bedtime For Cholesterol (Pharmacy Note That Crestor Changed To Simvastatin) 2)  Lisinopril 10 Mg Tabs (Lisinopril) .Marland Kitchen.. 1 Tablet By Mouth Daily 3)  Depo-Provera 150 Mg/ml Susp (Medroxyprogesterone Acetate) .... Im Q 3 Months 4)  One Touch Ultra Glucometer .... Check Sugars Twice A Day and Record Icd9 250.0 5)  One Touch Ultra Test Strips .... Check and Record Blood Sugar Two Times A Day and As Needed Low Sugar Symptoms 6)  Levemir 100 Unit/ml Soln (Insulin Detemir) .... Inject 22 Units of This Long-Acting Insulin Each Evening. 7)  Carvedilol 3.125 Mg Tabs (Carvedilol) .... Take 1 Tablet By Mouth Every 12 Hours(Per Psych Hosp) 8)  Depakote 500 Mg Tbec (  Divalproex Sodium) .... Take 1 Tablet By Mouth Two Times A Day 9)  Zolpidem Tartrate 5 Mg Tabs (Zolpidem Tartrate) .... Take 1 Tab By Mouth At Bedtime As Needed For Sleep 10)  Miralax  Powd (Polyethylene Glycol 3350) .Marland Kitchen.. 1 Capful Daily As Needed For Constipation 11)  Pepcid 20 Mg Tabs (Famotidine) .... Take 1 Tablet By Mouth Two Times A Day As Needed For Indigestion 12)  Prenatal Vitamins 0.8 Mg Tabs (Prenatal Multivit-Min-Fe-Fa) .... Take 1 Tablet By Mouth Once A Day 13)  Miconazole 3 200 Mg Supp (Miconazole Nitrate) .Marland Kitchen.. 1 Suppository Per Vagina For 3 Days 14)  Cetirizine Hcl 10 Mg  Tabs (Cetirizine Hcl) .... Take 1 Tablet By Mouth Once A Day For Allergies (Pharmacy: D/c Loratadine) 15)  Flonase 50 Mcg/act Susp (Fluticasone Propionate) .... 2 Sprays Each Nostril Once Daily For Allergies  Allergies (verified): 1)  ! Haldol  Past History:  Past Medical History: Last updated: 09/22/2009 Diabetes mellitus, type II Hyperlipidemia Hypertension Seizure disorder Extensive Psychiatric diagnosis    a.  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  Pt. transferred to [facility]    c.  Readmx to [facility] for psychosis with psych consult obtained (09/06/09-09/12/09) Mental Retardation Schizophrenia  Family History: adopted  Social History: Lives in supervised home with guardian - Meliton Rattan [phone]). Never Smoked Alcohol use-no Drug use-no  Review of Systems  The patient denies fever, chest pain, syncope, dyspnea on exertion, melena, hematochezia, hematuria, and breast masses.    Physical Exam  General:  alert, well-developed, and well-nourished.   Head:  normocephalic and atraumatic.   Eyes:  pupils equal, pupils round, and pupils reactive to light.   Ears:  R ear normal and L ear normal.   Nose:  mucosal erythema.   Mouth:  pharynx pink and moist.   Neck:  supple.   Breasts:  skin/areolae normal, no masses, no abnormal thickening, no nipple discharge, no tenderness, and no adenopathy.   Lungs:  normal breath sounds.   Heart:  normal rate and regular rhythm.   Abdomen:  soft, non-tender, normal bowel sounds, and no hepatomegaly.   Rectal:  no external abnormalities.   Genitalia:  normal introitus, no external lesions, no vaginal discharge, mucosa pink and moist, and no vaginal or cervical lesions.   exam difficult most narrow speculum used and provided most comfort fundus and adnexae difficult to palpate with body  habitus Msk:  normal ROM.   Extremities:  no edema no lesions on feet bilat Neurologic:  alert & oriented X3, cranial nerves II-XII intact, and strength normal in all extremities.   Skin:  turgor normal.   Psych:  normally interactive and good eye contact.    Diabetes Management Exam:    Foot Exam (with socks and/or shoes not present):       Sensory-Monofilament:          Left foot: normal          Right foot: normal   Impression & Recommendations:  Problem # 1:  EXAMINATION, ROUTINE MEDICAL (ICD-V70.0) discussed with patient . . . she is ok to get Pap today next pap due in 2016 wants to get HIV test not sexually active  Orders: [facility] [phone]) T-Pap Smear, Thin Prep [phone]) T- GC Chlamydia (82956) T-Urinalysis (21308-65784) T-HIV Antibody  (Reflex) (69629-52841)  Problem # 2:  ALLERGIC  RHINITIS (ICD-477.9) not using flonase daily will get her to use daily and take a "holiday" every 4 weeks and use saline nose spray  Her updated medication list for this problem includes:    Cetirizine Hcl 10 Mg Tabs (Cetirizine hcl) .Marland Kitchen... Take 1 tablet by mouth once a day for allergies (pharmacy: d/c loratadine)    Flonase 50 Mcg/act Susp (Fluticasone propionate) .Marland Kitchen... 2 sprays each nostril once daily for allergies  Problem # 3:  SEIZURE DISORDER (ICD-780.39) ck depakote levels  Her updated medication list for this problem includes:    Depakote 500 Mg Tbec (Divalproex sodium) .Marland Kitchen... Take 1 tablet by mouth two times a day  Orders: T-Valproic Acid (Depakene) ([address])  Problem # 4:  HYPERTENSION (ICD-401.9) controlled  Her updated medication list for this problem includes:    Lisinopril 10 Mg Tabs (Lisinopril) .Marland Kitchen... 1 tablet by mouth daily    Carvedilol 3.125 Mg Tabs (Carvedilol) .Marland Kitchen... Take 1 tablet by mouth every 12 hours(per psych hosp)  Orders: T-Urinalysis (09811-91478)  Problem # 5:  DIABETES MELLITUS, TYPE II (ICD-250.00) readings at home look good check A1C at  next visit sees Dr. Mitzi Davenport for eyes foot check done today  Her updated medication list for this problem includes:    Lisinopril 10 Mg Tabs (Lisinopril) .Marland Kitchen... 1 tablet by mouth daily    Levemir 100 Unit/ml Soln (Insulin detemir) ..... Inject 22 units of this long-acting insulin each evening.  Problem # 6:  BACTERIAL VAGINITIS (ICD-616.10) notes some odor will treat with flagyl  Her updated medication list for this problem includes:    Miconazole 3 200 Mg Supp (Miconazole nitrate) .Marland Kitchen... 1 suppository per vagina for 3 days    Flagyl 500 Mg Tabs (Metronidazole) .Marland Kitchen... Take 1 tablet by mouth two times a day  Complete Medication List: 1)  Simvastatin 80 Mg Tabs (Simvastatin) .... Take 1 tab by mouth at bedtime for cholesterol (pharmacy note that crestor changed to simvastatin) 2)  Lisinopril 10 Mg Tabs (Lisinopril) .Marland Kitchen.. 1 tablet by mouth daily 3)  Depo-provera 150 Mg/ml Susp (Medroxyprogesterone acetate) .... Im q 3 months 4)  One Touch Ultra Glucometer  .... Check sugars twice a day and record icd9 250.0 5)  One Touch Ultra Test Strips  .... Check and record blood sugar two times a day and as needed low sugar symptoms 6)  Levemir 100 Unit/ml Soln (Insulin detemir) .... Inject 22 units of this long-acting insulin each evening. 7)  Carvedilol 3.125 Mg Tabs (Carvedilol) .... Take 1 tablet by mouth every 12 hours(per psych hosp) 8)  Depakote 500 Mg Tbec (Divalproex sodium) .... Take 1 tablet by mouth two times a day 9)  Zolpidem Tartrate 5 Mg Tabs (Zolpidem tartrate) .... Take 1 tab by mouth at bedtime as needed for sleep 10)  Miralax Powd (Polyethylene glycol 3350) .Marland Kitchen.. 1 capful daily as needed for constipation 11)  Pepcid 20 Mg Tabs (Famotidine) .... Take 1 tablet by mouth two times a day as needed for indigestion 12)  Prenatal Vitamins 0.8 Mg Tabs (Prenatal multivit-min-fe-fa) .... Take 1 tablet by mouth once a day 13)  Miconazole 3 200 Mg Supp (Miconazole nitrate) .Marland Kitchen.. 1 suppository per  vagina for 3 days 14)  Cetirizine Hcl 10 Mg Tabs (Cetirizine hcl) .... Take 1 tablet by mouth once a day for allergies (pharmacy: d/c loratadine) 15)  Flonase 50 Mcg/act Susp (Fluticasone propionate) .... 2 sprays each nostril once daily for allergies 16)  Flagyl 500 Mg Tabs (Metronidazole) .... Take 1 tablet by  mouth two times a day  Hypertension Assessment/Plan:      The patient's hypertensive risk group is category C: Target organ damage and/or diabetes.  Her calculated 10 year risk of coronary heart disease is 2 %.  Today's blood pressure is 103/67.  Her blood pressure goal is < 130/80.   Patient Instructions: 1)  Please schedule a follow-up appointment in 3 months with [facility]iere[facility] for Diabetes. 2)  Return in one month fasting for labs:  Lipids and LFTs (Dx 272.4) . . . Do not eat or drink anything after midnight except water. 3)  Use flonase nasal spray every day.  Take a week off from using flonase after you use if for 3 weeks straight.  Then, start using again for 3 weeks, etc. 4)  You can also get saline nose spray and use it as needed.  Do not use is at the same time as the flonase. 5)  Use the Flagyl until it is gone.  It is to treat the odor you smell at times. Prescriptions: FLAGYL 500 MG TABS (METRONIDAZOLE) Take 1 tablet by mouth two times a day  #14 x 0   Entered and Authorized by:   Tereso Newcomer PA-C   Signed by:   Tereso Newcomer PA-C on 01/20/2010   Method used:   Print then Give to Patient   RxID:   [id number]   Last LDL:                                                 161 (11/13/2009 10:10:00 PM)        Diabetic Foot Exam Last Podiatry Exam Date: 01/20/2010  Foot Inspection Is there a history of a foot ulcer?              No Is there a foot ulcer now?              No Can the patient see the bottom of their feet?          Yes Are the shoes appropriate in style and fit?          Yes Is there swelling or an abnormal foot shape?          No Are the toenails long?                 No Are the toenails thick?                No Are the toenails ingrown?              No Is there heavy callous build-up?              No Is there a claw toe deformity?                          No Is there elevated skin temperature?            No Is there limited ankle dorsiflexion?            No Is there foot or ankle muscle weakness?            No Do you have pain in calf while walking?           No         10-g (5.07) Semmes-Weinstein  Monofilament Test Performed by: Armenia Shannon          Right Foot          Left Foot Visual Inspection               Test Control      normal         normal Site 1         normal         normal Site 2         normal         normal Site 3         normal         normal Site 4         normal         normal Site 5         normal         normal Site 6         normal         normal Site 7         normal         normal Site 8         normal         normal Site 9         normal         normal Site 10         normal         normal  Impression      normal         normal  Laboratory Results   Blood Tests     CBG Random:: 125mg /dL    [employer] [place] Source: vaginal WBC/hpf: 1-5 Bacteria/hpf: rare Clue cells/hpf: few  Negative whiff Yeast/hpf: none Wet Mount KOH: Negative Trichomonas/hpf: none

## 2010-10-07 NOTE — Letter (Signed)
Summary: Discharge Summary  Discharge Summary   Imported By: Arta Bruce 11/21/2009 15:43:28  _____________________________________________________________________  External Attachment:    Type:   Image     Comment:   External Document

## 2010-10-07 NOTE — Letter (Signed)
Summary: *HSN Results Follow up  [facility]  [address] [place], [place] [address]   Phone: [phone]  Fax: [phone]      05/01/2010   Vlasta Coppa [address] [place], [place]  [address]   Dear  [name],                            ____S.Drinkard,FNP   ____D. Gore,FNP       ____B. McPherson,MD   ____V. Rankins,MD    ____E. Mulberry,MD    ____N. Daphine Deutscher, FNP  ____D. Reche Dixon, MD    ____K. Philipp Deputy, MD    _x___S. Alben Spittle, PA-C     This letter is to inform you that your recent test(s):  _______Pap Smear    ___x____Lab Test     _______X-ray    ___x____ is within acceptable limits  _______ requires a medication change  _______ requires a follow-up lab visit  _______ requires a follow-up visit with your Josuha Fontanez   Comments:       _________________________________________________________ If you have any questions, please contact our office                     Sincerely,  Tereso Newcomer PA-C [facility]

## 2010-10-07 NOTE — Miscellaneous (Signed)
Summary: Insulin syringes  Clinical Lists Changes  Medications: Changed medication from SURE COMFORT PEN NEEDLES 31G X 8 MM MISC (INSULIN PEN NEEDLE) use with Levemir flexpen to INSULIN SYRINGE/NEEDLE 28G X 1/2" 1 ML MISC (INSULIN SYRINGE-NEEDLE U-100) Use with levemir insulin - Signed Rx of INSULIN SYRINGE/NEEDLE 28G X 1/2" 1 ML MISC (INSULIN SYRINGE-NEEDLE U-100) Use with levemir insulin;  #100 x 5;  Signed;  Entered by: Lehman Prom FNP;  Authorized by: Lehman Prom FNP;  Method used: Electronically to [facility] [address], [place], [place], [place]  [address], Ph: [phone], Fax: [phone]    Prescriptions: INSULIN SYRINGE/NEEDLE 28G X 1/2" 1 ML MISC (INSULIN SYRINGE-NEEDLE U-100) Use with levemir insulin  #100 x 5   Entered and Authorized by:   Lehman Prom FNP   Signed by:   Lehman Prom FNP on 04/23/2010   Method used:   Electronically to        [facility]* (retail)       [address]       [place], [place]  [address]       Ph: [phone]       Fax: [phone]   RxID:   [id number]

## 2010-10-07 NOTE — Letter (Signed)
Summary: HEALTHCARE APPT SUMMARY  HEALTHCARE APPT SUMMARY   Imported By: Arta Bruce 02/18/2010 15:15:44  _____________________________________________________________________  External Attachment:    Type:   Image     Comment:   External Document

## 2010-10-07 NOTE — Letter (Signed)
Summary: *HSN Results Follow up  [facility]  [address] [place], [place] [address]   Phone: [phone]  Fax: [phone]      11/22/2009   Iasha Stangler [address] [place], [place]  [address]   Dear  [name],                            ____S.Drinkard,FNP   ____D. Gore,FNP       ____B. McPherson,MD   ____V. Rankins,MD    ____E. Mulberry,MD    ____N. Daphine Deutscher, FNP  ____D. Reche Dixon, MD    ____K. Philipp Deputy, MD    ____Other     This letter is to inform you that your recent test(s):  _______Pap Smear    _______Lab Test     _______X-ray    _______ is within acceptable limits  _______ requires a medication change  _______ requires a follow-up lab visit  _______ requires a follow-up visit with your provider   Comments:  We have been trying to reach you regarding your sugar level.   Please give [place] a call at your earliest convenience.       _________________________________________________________ If you have any questions, please contact our office                     Sincerely,  Armenia Shannon [facility]

## 2010-10-07 NOTE — Letter (Signed)
Summary: [facility]ARE APPOINT SUMMARY  [facility]ALTH[facility] APPOINT SUMMARY   Imported By: Arta Bruce 11/14/2009 10:16:57  _____________________________________________________________________  External Attachment:    Type:   Image     Comment:   External Document

## 2010-10-07 NOTE — Letter (Signed)
Summary: [facility]/////Appointment Summary  [facility]/////Appointment Summary   Imported By: Arta Bruce 08/14/2010 16:10:49  _____________________________________________________________________  External Attachment:    Type:   Image     Comment:   External Document

## 2010-10-07 NOTE — Letter (Signed)
Summary: Discharge Summary  Discharge Summary   Imported By: Arta Bruce 11/21/2009 15:45:52  _____________________________________________________________________  External Attachment:    Type:   Image     Comment:   External Document

## 2010-10-07 NOTE — Assessment & Plan Note (Signed)
Summary: FU IN 3 MONTHS WIT [facility] FOR DM, A1C//GK   Vital Signs:  Patient profile:   30 year old female Menstrual status:  depo Height:      62 inches Weight:      194 pounds BMI:     35.61 Temp:     97.8 degrees F oral Pulse rate:   84 / minute Pulse rhythm:   regular Resp:     18 per minute BP sitting:   124 / 86  (right arm) Cuff size:   regular  Vitals Entered By: CMA Student CC: 3 month follow-up with for diabetes, patient has had meter for over four years inquiring about a new one., Hypertension Management Is Patient Diabetic? Yes Pain Assessment Patient in pain? no      CBG Result 112  Does patient need assistance? Functional Status Self care   Primary Care Provider:  Tereso Newcomer PA-C  CC:  3 month follow-up with for diabetes, patient has had meter for over four years inquiring about a new one., and Hypertension Management.  History of Present Illness: Here with guardian Hotel manager).  New guardian since last visit. Eye Dr:  Dr. Mitzi Davenport. Sugars at home run better in the morning (120-150s) and higher in the evening (190-240s - highest 302). + polydipsia + polyuria no dysuria Admits to poor diet.  Eats hamburgers, fries, etc a lot. Never saw dietician.  Hypertension History:      She denies headache, chest pain, dyspnea with exertion, and syncope.  She notes no problems with any antihypertensive medication side effects.        Positive major cardiovascular risk factors include diabetes, hyperlipidemia, and hypertension.  Negative major cardiovascular risk factors include female age less than 61 years old and non-tobacco-user status.     Current Medications (verified): 1)  Simvastatin 80 Mg Tabs (Simvastatin) .... Take 1 Tab By Mouth At Bedtime For Cholesterol (Pharmacy Note That Crestor Changed To Simvastatin) 2)  Lisinopril 10 Mg Tabs (Lisinopril) .Marland Kitchen.. 1 Tablet By Mouth Daily 3)  Depo-Provera 150 Mg/ml Susp (Medroxyprogesterone Acetate) .... Im Q 3  Months 4)  One Touch Ultra Glucometer .... Check Sugars Twice A Day and Record Icd9 250.0 5)  One Touch Ultra Test Strips .... Check and Record Blood Sugar Two Times A Day and As Needed Low Sugar Symptoms 6)  Levemir 100 Unit/ml Soln (Insulin Detemir) .... Inject 22 Units of This Long-Acting Insulin Each Evening. 7)  Carvedilol 3.125 Mg Tabs (Carvedilol) .... Take 1 Tablet By Mouth Every 12 Hours(Per Psych Hosp) 8)  Depakote 500 Mg Tbec (Divalproex Sodium) .... Take 1 Tablet By Mouth Two Times A Day 9)  Zolpidem Tartrate 5 Mg Tabs (Zolpidem Tartrate) .... Take 1 Tab By Mouth At Bedtime As Needed For Sleep 10)  Miralax  Powd (Polyethylene Glycol 3350) .Marland Kitchen.. 1 Capful Daily As Needed For Constipation 11)  Pepcid 20 Mg Tabs (Famotidine) .... Take 1 Tablet By Mouth Two Times A Day 12)  Prenatal Vitamins 0.8 Mg Tabs (Prenatal Multivit-Min-Fe-Fa) .... Take 1 Tablet By Mouth Once A Day 13)  Miconazole 3 200 Mg Supp (Miconazole Nitrate) .Marland Kitchen.. 1 Suppository Per Vagina For 3 Days 14)  Cetirizine Hcl 10 Mg Tabs (Cetirizine Hcl) .... Take 1 Tablet By Mouth Once A Day For Allergies (Pharmacy: D/c Loratadine) 15)  Flonase 50 Mcg/act Susp (Fluticasone Propionate) .... 2 Sprays Each Nostril Once Daily For Allergies 16)  Insulin Syringe/needle 28g X 1/2" 1 Ml Misc (Insulin Syringe-Needle U-100) .... Use With Levemir  Insulin 17)  Geodon 20 Mg Caps (Ziprasidone Hcl) .... Take 2 Capsule By Mouth At Bedtime Daily. 18)  Benztropine Mesylate 1 Mg Tabs (Benztropine Mesylate) .Marland Kitchen.. 1 Tablet By Mouth Twice Daily  Social History: Lives in supervised home with guardian - Dorrene German Never Smoked Alcohol use-no Drug use-no  Physical Exam  General:  alert, well-developed, and well-nourished.   Head:  normocephalic and atraumatic.   Neck:  supple.   Lungs:  normal breath sounds.   Heart:  normal rate and regular rhythm.   Extremities:  no edema  Neurologic:  alert & oriented X3 and cranial nerves II-XII intact.    Psych:  normally interactive.    Diabetes Management Exam:    Foot Exam (with socks and/or shoes not present):       Sensory-Monofilament:          Left foot: normal          Right foot: normal       Inspection:          Left foot: normal          Right foot: abnormal             Comments: small callus at tip of great toe       Nails:          Left foot: normal          Right foot: normal    Eye Exam:       Eye Exam done elsewhere          Done by: Dr. Mitzi Davenport   Impression & Recommendations:  Problem # 1:  CALLUS, FOOT (ICD-700) advised Georgetta Haber and guardian to watch this proper foot wear discussed if changes noted, she should f/u sooner  Problem # 2:  DIABETES MELLITUS, TYPE II (ICD-250.00) A1C 7.7 now increase Levemir to 30 units at bedtime refer to Susie Piper   Her updated medication list for this problem includes:    Lisinopril 10 Mg Tabs (Lisinopril) .Marland Kitchen... 1 tablet by mouth daily    Levemir 100 Unit/ml Soln (Insulin detemir) ..... Inject 30 units at bedtime (note dose increase)  Orders: Capillary Blood Glucose/CBG ([address]) Hemoglobin A1C ([address]) T-Comprehensive Metabolic Panel ([address]11-91478)  Problem # 3:  HYPERLIPIDEMIA (ICD-272.4)  on high dose simvastatin < 1 year with new info from FDA, will d/c simvastatin and change to pravastatin check FLP and LFTs in 3 mos  Her updated medication list for this problem includes:    Pravastatin Sodium 80 Mg Tabs (Pravastatin sodium) .Marland Kitchen... Take 1 tab by mouth at bedtime for cholesterol (d/c simvastatin)  Orders: T-Comprehensive Metabolic Panel ([address]562-13086)  Problem # 4:  HYPERTENSION (ICD-401.9)  controlled  Her updated medication list for this problem includes:    Lisinopril 10 Mg Tabs (Lisinopril) .Marland Kitchen... 1 tablet by mouth daily    Carvedilol 3.125 Mg Tabs (Carvedilol) .Marland Kitchen... Take 1 tablet by mouth every 12 hours(per psych hosp)  Orders: T-Comprehensive Metabolic Panel ([address]846-96295)  Complete  Medication List: 1)  Pravastatin Sodium 80 Mg Tabs (Pravastatin sodium) .... Take 1 tab by mouth at bedtime for cholesterol (d/c simvastatin) 2)  Lisinopril 10 Mg Tabs (Lisinopril) .Marland Kitchen.. 1 tablet by mouth daily 3)  Depo-provera 150 Mg/ml Susp (Medroxyprogesterone acetate) .... Im q 3 months 4)  One Touch Ultra Glucometer  .... Check sugars twice a day and record icd9 250.0 5)  One Touch Ultra Test Strips  .... Check and record blood sugar two times a day and  as needed low sugar symptoms 6)  Levemir 100 Unit/ml Soln (Insulin detemir) .... Inject 30 units at bedtime (note dose increase) 7)  Carvedilol 3.125 Mg Tabs (Carvedilol) .... Take 1 tablet by mouth every 12 hours(per psych hosp) 8)  Depakote 500 Mg Tbec (Divalproex sodium) .... Take 1 tablet by mouth two times a day 9)  Zolpidem Tartrate 5 Mg Tabs (Zolpidem tartrate) .... Take 1 tab by mouth at bedtime as needed for sleep 10)  Miralax Powd (Polyethylene glycol 3350) .Marland Kitchen.. 1 capful daily as needed for constipation 11)  Pepcid 20 Mg Tabs (Famotidine) .... Take 1 tablet by mouth two times a day 12)  Prenatal Vitamins 0.8 Mg Tabs (Prenatal multivit-min-fe-fa) .... Take 1 tablet by mouth once a day 13)  Miconazole 3 200 Mg Supp (Miconazole nitrate) .Marland Kitchen.. 1 suppository per vagina for 3 days 14)  Cetirizine Hcl 10 Mg Tabs (Cetirizine hcl) .... Take 1 tablet by mouth once a day for allergies (pharmacy: d/c loratadine) 15)  Flonase 50 Mcg/act Susp (Fluticasone propionate) .... 2 sprays each nostril once daily for allergies 16)  Insulin Syringe/needle 28g X 1/2" 1 Ml Misc (Insulin syringe-needle u-100) .... Use with levemir insulin 17)  Geodon 20 Mg Caps (Ziprasidone hcl) .... Take 2 capsule by mouth at bedtime daily. 18)  Benztropine Mesylate 1 Mg Tabs (Benztropine mesylate) .Marland Kitchen.. 1 tablet by mouth twice daily  Hypertension Assessment/Plan:      The patient's hypertensive risk group is category C: Target organ damage and/or diabetes.  Her calculated 10  year risk of coronary heart disease is 3 %.  Today's blood pressure is 124/86.  Her blood pressure goal is < 130/80.  Patient Instructions: 1)  Schedule appt with Susie Piper for help with diet. 2)  Finish current bottle of Simvastatin.  Then, stop this medication. 3)  After you finish the Simvastatin, start Pravastatin 80 mg at bedtime for cholesterol. 4)  Schedule FLP and LFTs 3 months after changing to Pravastatin (Dx 272.4). 5)  Stop taking Levemir 22 units.  Increase Levemir to 30 units at bedtime. 6)  Keep checking sugars three times a day before each meal. 7)  Call if you have any sugars that are low (less than 60) and you feel bad or sugars over 300. 8)  Keep diary of your diet for 1-2 weeks before seeing the dietician.  Bring that list in with you to your appt. 9)  Please schedule a follow-up appointment in 3 months with Scott for diabetes.  10)  Keep an eye on your callus on your right toe.  Wear closed shoes with soft white socks.  Call if you think the callus is getting larger or changing. Prescriptions: LEVEMIR 100 UNIT/ML SOLN (INSULIN DETEMIR) Inject 30 units at bedtime (note dose increase)  #1 mo. supply x 11   Entered and Authorized by:   Tereso Newcomer PA-C   Signed by:   Tereso Newcomer PA-C on 04/28/2010   Method used:   Printed then faxed to ...       [facility]* (retail)       [address]       [place], [place]  [address]       Ph: [phone]       Fax: [phone]   RxID:   [id number] PRAVASTATIN SODIUM 80 MG TABS (PRAVASTATIN SODIUM) Take 1 tab by mouth at bedtime for cholesterol (d/c simvastatin)  #30 x 5  Entered and Authorized by:   Tereso Newcomer PA-C   Signed by:   Tereso Newcomer PA-C on 04/28/2010   Method used:   Printed then faxed to ...       [facility]* (retail)       [address]       [place], [place]  [address]       Ph: [phone]       Fax: [phone]   RxID:    [id number]   Laboratory Results   Blood Tests     HGBA1C: 7.7%   (Normal Range: Non-Diabetic - 3-6%   Control Diabetic - 6-8%) CBG Random:: 112mg /dL      Last LDL:                                                 161 (11/13/2009 10:10:00 PM)        Diabetic Foot Exam Last Podiatry Exam Date: 01/20/2010    10-g (5.07) Semmes-Weinstein Monofilament Test Performed by: CMA Student          Right Foot          Left Foot Visual Inspection               Test Control      normal         normal Site 1         normal         normal Site 2         normal         normal Site 3         normal         normal Site 4         normal         normal Site 5         normal         normal Site 6         normal         normal Site 7         normal         normal Site 8         normal         normal Site 9         normal         normal Site 10         normal         normal  Impression      normal         normal

## 2010-10-07 NOTE — Progress Notes (Signed)
Summary: Office Visit//HEALTHCARE APPT SUMMARY  Office Visit//HEALTHCARE APPT SUMMARY   Imported By: Arta Bruce 02/07/2010 12:17:42  _____________________________________________________________________  External Attachment:    Type:   Image     Comment:   External Document

## 2010-10-07 NOTE — Progress Notes (Signed)
Summary: Needs to schedule appt  Phone Note From Other Clinic   Reason for Call: Schedule Patient Appt Summary of Call: [facility] contacted [place] because they could not reach Alexandria Harvey to schedule her appt.  Please call and have her contact them to schedule an appt.  Initial call taken by: Brynda Rim,  March 02, 2010 3:44 PM  Follow-up for Phone Call        Spoke with pt. and her caregiver and advised of need to contact center -- states will contact them to make appt.  Dutch Quint RN  March 05, 2010 3:10 PM

## 2010-10-07 NOTE — Miscellaneous (Signed)
  Clinical Lists Changes  Observations: Added new observation of PAST MED HX: Diabetes mellitus, type II Hyperlipidemia Hypertension Seizure disorder Extensive Psychiatric diagnosis    a.  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  Pt. transferred to [facility]    c.  Readmx to [facility] for psychosis with psych consult obtained (09/06/09-09/12/09) Mental Retardation Schizophrenia  (09/22/2009 11:10)       Past History:  Past Medical History: Diabetes mellitus, type II Hyperlipidemia Hypertension Seizure disorder Extensive Psychiatric diagnosis    a.  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  Pt. transferred to [facility]    c.  Readmx to [facility] for psychosis with psych consult obtained (09/06/09-09/12/09) Mental Retardation Schizophrenia

## 2010-10-07 NOTE — Progress Notes (Signed)
Summary: [name] SUGAR READINGS  Phone Note Other Incoming   Caller: [name]- [name] CAREGIVER Summary of Call: [name] PT. MS [name] SAYS THAT [name] SUGAR READDINGS ARE 03/30 @ NIGHT 250, 03/31 @NIGHT  114, 04/01 @MORN . IT WAS 143, @NIGHT  145, 04/02 NIGHT 232, 04/03 MORN. 138, @ NIGHT IT 156, 04/04 MORN. IT WAS 128, NIGHT 160, 04/05 @MORN  IT WAS 129, NIGHT 128, 04/06 MORN IT WAS 125, @NIGHT  308, 04/07 MORN. 143, NIGHT 151, 04/08 MORN, 125, NIGHT 185, 04/09 MORN. 137, NIGHT 116, 04/10 MORN. 132, NIGHT 199, 04/11 MORN. 128, NIGHT 216, 04/12 MORN. 154, NIGHT 119, 04/13 MORN 122, NIGHT 203 Initial call taken by: Leodis Rains,  December 19, 2009 9:46 AM  Follow-up for Phone Call        She can come in today if they would like to be seen. I can check her A1C (she is 3 mos post last check) and adjust her insulin if needed. Follow-up by: Tereso Newcomer PA-C,  December 19, 2009 10:25 AM

## 2010-10-07 NOTE — Letter (Signed)
Summary: HEALTHCARE APPT SUMMARY  HEALTHCARE APPT SUMMARY   Imported By: Arta Bruce 05/22/2010 10:19:56  _____________________________________________________________________  External Attachment:    Type:   Image     Comment:   External Document

## 2010-10-07 NOTE — Progress Notes (Signed)
  Phone Note Call from Patient   Summary of Call: spoke with care taker and she let me know that ever time pt takes prenatal vitamins her blood sugar goes up Initial call taken by: [place] Shannon,  November 18, 2009 4:40 PM  Follow-up for Phone Call        how high does it go? does it have sugar in it?  should say on label. is she taking it with meals?  if so, sugar likely going up due to meal, not vitamin. Follow-up by: Tereso Newcomer PA-C,  November 18, 2009 5:41 PM  Additional Follow-up for Phone Call Additional follow up Details #1::        Left message on answering machine for pt to call back.Marland KitchenMarland KitchenArmenia Shannon  November 20, 2009 11:59 AM   Left message on answering machine for pt to call back.Marland KitchenMarland KitchenMarland KitchenArmenia Shannon  November 20, 2009 4:30 PM   Left message on answering machine for pt to call back.Marland KitchenMarland KitchenMarland KitchenMarland Kitchen will mail letter... [place] Shannon  November 22, 2009 12:32 PM

## 2010-10-07 NOTE — Letter (Signed)
Summary: CALL A NURSE  CALL A NURSE   Imported By: Arta Bruce 07/23/2010 15:01:09  _____________________________________________________________________  External Attachment:    Type:   Image     Comment:   External Document

## 2010-10-07 NOTE — Letter (Signed)
Summary: Discharge Summary  Discharge Summary   Imported By: Arta Bruce 09/10/2009 14:13:43  _____________________________________________________________________  External Attachment:    Type:   Image     Comment:   External Document

## 2010-10-07 NOTE — Letter (Signed)
Summary: *HSN Results Follow up  [facility]  [address] [place], [place] [address]   Phone: [phone]  Fax: [phone]      01/24/2010   Alexandria Harvey [address] [place], [place]  [address]   Dear  [name],                            ____S.Drinkard,FNP   ____D. Gore,FNP       ____B. McPherson,MD   ____V. Rankins,MD    ____E. Mulberry,MD    ____N. Daphine Deutscher, FNP  ____D. Reche Dixon, MD    ____K. Philipp Deputy, MD    __x__S. Alben Spittle, PA-C     This letter is to inform you that your recent test(s):  ___x____Pap Smear    ___x____Lab Test     _______X-ray    ___x____ is within acceptable limits  _______ requires a medication change  _______ requires a follow-up lab visit  _______ requires a follow-up visit with your provider   Comments:       _________________________________________________________ If you have any questions, please contact our office                     Sincerely,  Tereso Newcomer PA-C [facility]

## 2010-10-07 NOTE — Assessment & Plan Note (Signed)
Summary: HOSPITAL D/C & MEDICATION RECONCILIATION//RJP   Vital Signs:  Patient profile:   30 year old female Menstrual status:  depo Height:      62 inches Weight:      188 pounds BMI:     34.51 Temp:     97.1 degrees F oral Pulse rate:   89 / minute Pulse rhythm:   regular Resp:     18 per minute BP sitting:   126 / 79  (left arm) Cuff size:   regular  Vitals Entered By: Armenia Shannon (September 26, 2009 2:09 PM) CC: hospital f/u...., Hypertension Management Is Patient Diabetic? Yes Pain Assessment Patient in pain? no      CBG Result 196  Does patient need assistance? Functional Status Self care Ambulation Normal   Primary Care Provider:  Tereso Newcomer PA-C  CC:  hospital f/u.... and Hypertension Management.  History of Present Illness: 30 year old female with hypertension, hyperlipidemia and diabetes who has been in and out of the hospital since September for psychosis.  She carries a history of schizophrenia.  In November, she was supposed to be discharged to [facility] in [place].  She's had her medications changed for her schizophrenia multiple times.  She apparently had significant side effects to Zyprexa.  She is now seeing a psychiatrist in [facility] named Dr. Yancey Flemings.  She has had some problems with constipation.  She apparently had some rectal bleeding associated with a hard bowel movement recently.  She was seen in the emergency room and told that she had a rectal tear.  Her stools are softer now.  She's feeling better.  She has been discharged to a group home and her caretaker is now Andi Hence who is here with her today.  I spent a great amount of time going through her care book making sure her medications are correct and the system.  She is no longer on metformin and glipizide for her diabetes.  She is now only taking Levemir insulin.  Her sugars at home are averaging somewhere around 150. She had been on omeprazole.  She has not had this medication for a  few weeks and is doing well with just occasional dyspepsia.  She also had a prescription for MiraLax.  That prescription has run out.   Hypertension History:      She denies chest pain, dyspnea with exertion, and syncope.        Positive major cardiovascular risk factors include diabetes, hyperlipidemia, and hypertension.  Negative major cardiovascular risk factors include female age less than 67 years old and non-tobacco-user status.     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 1 Tablet By Mouth Daily **needs Office Visit** 2)  Lisinopril 10 Mg Tabs (Lisinopril) .Marland Kitchen.. 1 Tablet By Mouth Daily **pt Needs Office Visit** 3)  Glucophage 1000 Mg Tabs (Metformin Hcl) .... Take One Tablet By Mouth With Breakfast and Supper and 1/2 Tablet At Lunch. 4)  Loratadine 10 Mg Tabs (Loratadine) .... One Every Day 5)  Depo-Provera 150 Mg/ml Susp (Medroxyprogesterone Acetate) .... Im Q 3 Months 6)  One Touch Ultra Glucometer .... Check Sugars Twice A Day and Record Icd9 250.0 7)  One Touch Ultra Test Strips .... Check and Record Blood Sugar Two Times A Day and As Needed Low Sugar Symptoms 8)  Terbinafine Hcl 1 % Crea (Terbinafine Hcl) .... Apply To Feet and Toes Two Times A Day Until Athletes Foot Rash Is Gone 9)  Levemir 100 Unit/ml Soln (Insulin Detemir) .... Inject 25 Units of This Long-Acting Insulin  Each Evening. 10)  Glipizide 5 Mg Xr24h-Tab (Glipizide) .... Take 1 Tablet By Mouth Every Morning(Per Psych Hosp) 11)  Carvedilol 3.125 Mg Tabs (Carvedilol) .... Take 1 Tablet By Mouth Every 12 Hours(Per Psych Hosp) 12)  Zyprexa 15 Mg Tabs (Olanzapine) .... Take 2 Tablets By Mouth At Bedtime(Per Psychiatrist) 13)  Hydroxyzine Hcl 25 Mg Tabs (Hydroxyzine Hcl) .... Take 1 Tablet By Mouth Every 8 Hours (Psychiatrist) 14)  Depakote 250 Mg Tbec (Divalproex Sodium) .... Take 3 Tablets By Mouth Two Times A Day(Psychiatrist) 15)  Benztropine Mesylate 1 Mg Tabs (Benztropine Mesylate) .... Take One Tablet By Mouth Two Times A Day(Psychiatrist)  Allergies (verified): 1)  ! Haldol  Past History:  Past Medical History: Last updated: 09/22/2009 Diabetes mellitus, type II Hyperlipidemia Hypertension Seizure disorder Extensive Psychiatric diagnosis    a.  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  Pt. transferred to [facility]    c.  Readmx to [facility] for psychosis with psych consult obtained (09/06/09-09/12/09) Mental Retardation Schizophrenia  Social History: Lives in supervised home with guardian - Andi Hence Christian(#[phone]). Never Smoked Alcohol use-no Drug use-no  Physical Exam  General:  alert, well-developed, and well-nourished.   Head:  normocephalic and atraumatic.   Neck:  supple.   Lungs:  normal breath sounds, no crackles, and no wheezes.   Heart:  normal rate and regular rhythm.   Abdomen:  soft and non-tender.   Neurologic:  alert & oriented X3 and cranial nerves II-XII intact.   Psych:  flat affect.     Impression & Recommendations:  Problem # 1:  UNSPECIFIED SCHIZOPHRENIA CHRONIC CONDITION (ICD-295.92)  f/u with psych  Orders: T-Comprehensive Metabolic Panel ([id number]) T-CBC w/Diff ([id number]) T-Valproic Acid (Depakene)  (30865-78469) T-TSH (62952-84132)  Problem # 2:  HYPERTENSION (ICD-401.9)  controlled  Her updated medication list for this problem includes:    Lisinopril 10 Mg Tabs (Lisinopril) .Marland Kitchen... 1 tablet by mouth daily **pt needs office visit**    Carvedilol 3.125 Mg Tabs (Carvedilol) .Marland Kitchen... Take 1 tablet by mouth every 12 hours(per psych hosp)  Orders: T-Comprehensive Metabolic Panel ([id number]) T-CBC w/Diff (66440-34742)  Problem # 3:  DIABETES MELLITUS, TYPE II (ICD-250.00)  now on Levemir only check A1C  The following medications were removed from the medication list:    Glucophage 1000 Mg Tabs (Metformin hcl) .Marland Kitchen... Take one tablet by mouth with breakfast and supper and 1/2 tablet at lunch.    Glipizide 5 Mg Xr24h-tab (Glipizide) .Marland Kitchen... Take 1 tablet by mouth every morning(per psych hosp) Her updated medication list for this problem includes:    Lisinopril 10 Mg Tabs (Lisinopril) .Marland Kitchen... 1 tablet by mouth daily **pt needs office visit**    Levemir 100 Unit/ml Soln (Insulin detemir) ..... Inject 22 units of this long-acting insulin  each evening.  Orders: T-Comprehensive Metabolic Panel [id number]) T-CBC w/Diff [id number]) T- Hemoglobin A1C (29562-13086) T-TSH (57846-96295)  Problem # 4:  HYPERLIPIDEMIA (ICD-272.4)  not fasting get labs  Her updated medication list for this problem includes:    Zocor 40 Mg Tabs (Simvastatin) .Marland Kitchen... 1 tablet by mouth daily **needs office visit**  Orders: T-Comprehensive Metabolic Panel ([id number]) T-CBC w/Diff ([id number])  Problem # 5:  CONSTIPATION (ICD-564.00) refill miralax  Her updated medication list for this problem includes:    Miralax Powd (Polyethylene glycol 3350) .Marland Kitchen... 1 capful daily as needed for constipation  Problem # 6:  DYSPEPSIA (ICD-536.8) was on omeprazole only having occ symptoms now on no med will give pepcid as needed  Complete Medication List: 1)  Zocor 40 Mg Tabs (Simvastatin) .Marland Kitchen.. 1 tablet by mouth daily  **needs office visit** 2)  Lisinopril 10 Mg Tabs (Lisinopril) .Marland Kitchen.. 1 tablet by mouth daily **pt needs office visit** 3)  Loratadine 10 Mg Tabs (Loratadine) .... One every day 4)  Depo-provera 150 Mg/ml Susp (Medroxyprogesterone acetate) .... Im q 3 months 5)  One Touch Ultra Glucometer  .... Check sugars twice a day and record icd9 250.0 6)  One Touch Ultra Test Strips  .... Check and record blood sugar two times a day and as needed low sugar symptoms 7)  Levemir 100 Unit/ml Soln (Insulin detemir) .... Inject 22 units of this long-acting insulin each evening. 8)  Carvedilol 3.125 Mg Tabs (Carvedilol) .... Take 1 tablet by mouth every 12 hours(per psych hosp) 9)  Depakote 500 Mg Tbec (Divalproex sodium) .... Take 1 tablet by mouth two times a day 10)  Benztropine Mesylate 2 Mg Tabs (Benztropine mesylate) .... Take 1 tablet by mouth two times a day 11)  Zolpidem Tartrate 5 Mg Tabs (Zolpidem tartrate) .... Take 1 tab by mouth at bedtime as needed for sleep 12)  Miralax Powd (Polyethylene glycol 3350) .Marland Kitchen.. 1 capful daily as needed for constipation 13)  Pepcid 20 Mg Tabs (Famotidine) .... Take 1 tablet by mouth two times a day as needed for indigestion  Hypertension Assessment/Plan:      The patient's hypertensive risk group is category C: Target organ damage and/or diabetes.  Her calculated 10 year risk of coronary heart disease is 2 %.  Today's blood pressure is 126/79.  Her blood pressure goal is < 130/80.  Patient Instructions: 1)  Please schedule a follow-up appointment in 1 month with Erwin Nishiyama for diabetes.  Prescriptions: PEPCID 20 MG TABS (FAMOTIDINE) Take 1 tablet by mouth two times a day as needed for indigestion  #30 x 3   Entered and Authorized by:   Tereso Newcomer PA-C   Signed by:   Tereso Newcomer PA-C on 09/26/2009   Method used:   Electronically to        [facility]* (retail)       [address]       [place], [place]  [address]       Ph: [phone]        Fax: [phone]   RxID:   [id number] MIRALAX  POWD (POLYETHYLENE GLYCOL 3350) 1 capful daily as needed for constipation  #1 bottle x 3   Entered and Authorized by:   Tereso Newcomer PA-C   Signed by:   Tereso Newcomer PA-C on 09/26/2009   Method used:   Electronically to        [facility]* (retail)  [address]       [place], [place]  [address]       Ph: [phone]       Fax: [phone]   RxID:   [id number]   Laboratory Results   Blood Tests   Date/Time Received: September 26, 2009 3:03 PM   HGBA1C: 6.0%   (Normal Range: Non-Diabetic - 3-6%   Control Diabetic - 6-8%) CBG Random:: 196mg /dL

## 2010-10-07 NOTE — Progress Notes (Signed)
Summary: APPT SCHEDULED  Phone Note From Other Clinic   Summary of Call:  MICHELLE, [facility] , RESIIDENT PROVIDER, CALLED IN BECAUSE tHE PT WAS DISCHARGE FROM  [facility] ON 12/20 AND GOT  (LENTUS 30 UNITS AND HUMALAG INSULIN) BASIC ON HER SCALE) PRESCRIPTION AND THEN FROM [facility] THE PT WAS DISCHARGE YESTERDAY (09/12/2009) THE PT WAS USING LEVEMIR 22 UNITS AND THEN THEY ALSO PRESCRIBE HER 8 UNITS THREE TIMES AT DAY WITH MEALS.  THE PT DO NOT KNOW WHAT MEDICATIONS TO FOLLOW THE ORDER FROM [facility] OR THE ORDER FROM THE [facility].  YOU CAN REACH MS. Marcelino Duster [phone] IN CASE YOU HAVE ANY QUESTIONS. 2ND MESSAGE .  THE PT WAS DROPPED OUT FROM THE PSYCHIATRIC BECAUSE SHE NEEDS TO DO A BLOOD TEST (AMMONIA AND DEPAKOTE TEST) ASAP.   Initial call taken by: Manon Hilding,  September 13, 2009 3:28 PM  Follow-up for Phone Call        SPOKE WITH CARETAKER MICHELLE AND SCHEDULED [place] AN APPT. DID ADVISE CARETAKER TO FOLLOW THE LASTEST DISCHARGED PAPERS. Follow-up by: Mikey College CMA,  September 17, 2009 2:21 PM

## 2010-10-07 NOTE — Letter (Signed)
Summary: CARE MANAGEMENT DEPT.  CARE MANAGEMENT DEPT.   Imported By: Arta Bruce 11/07/2009 15:59:11  _____________________________________________________________________  External Attachment:    Type:   Image     Comment:   External Document

## 2010-10-07 NOTE — Medication Information (Signed)
Summary: RX Folder/[facility]  RX Folder/[facility]   Imported By: Arta Bruce 01/23/2010 14:48:32  _____________________________________________________________________  External Attachment:    Type:   Image     Comment:   External Document

## 2010-10-07 NOTE — Letter (Signed)
Summary: [facility]  [facility]   Imported By: Arta Bruce 03/04/2010 14:41:44  _____________________________________________________________________  External Attachment:    Type:   Image     Comment:   External Document

## 2010-10-07 NOTE — Letter (Signed)
Summary: [facility] APPOINTMENT SUMMARY  [facility] APPOINTMENT SUMMARY   Imported ByArta Bruce 07/29/2010 14:58:32  _____________________________________________________________________  External Attachment:    Type:   Image     Comment:   External Document

## 2010-10-07 NOTE — Assessment & Plan Note (Signed)
Summary: FOLLOW UP IN 1 MONTH WITH SCOTT FOR DM//GK   Vital Signs:  Patient profile:   30 year old female Menstrual status:  depo Height:      62 inches Weight:      188 pounds BMI:     34.51 Temp:     97.8 degrees F oral Pulse rate:   89 / minute Pulse rhythm:   regular Resp:     18 per minute BP sitting:   101 / 68  (left arm) Cuff size:   regular  Vitals Entered By: Armenia Shannon (October 28, 2009 2:49 PM) CC: f/u.... pt needs refills on meds and rx for ear wax remaoval... pt says she has been miralax and ... caregiver would like to know a good vitamin for pt especially good for her hair...., Hypertension Management Is Patient Diabetic? Yes Pain Assessment Patient in pain? no      CBG Result 158  Does patient need assistance? Functional Status Self care Ambulation Normal   Primary Care Provider:  Tereso Newcomer PA-C  CC:  f/u.... pt needs refills on meds and rx for ear wax remaoval... pt says she has been miralax and ... caregiver would like to know a good vitamin for pt especially good for her hair.... and Hypertension Management.  History of Present Illness: Here for f/u. Labs last time looked ok. No check on chol yet. A1C looked good as well. Sugars look good at home.  Most below 200.   Sees eye dr. next week. Taking Levemir as prescribed.  Seeing psych.  Worried about hair thinning with depakote.  Used to take prenatal vitamins and wants to restart.  Thinks she has a lot of wax in ears.  No pain.  Says she just started having vaginal itching last night.  Not sexually active.  No discharge noted.   Hypertension History:      She denies chest pain, dyspnea with exertion, and syncope.        Positive major cardiovascular risk factors include diabetes, hyperlipidemia, and hypertension.  Negative major cardiovascular risk factors include female age less than 109 years old and non-tobacco-user status.    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 1 Tablet By Mouth Daily At Bedtime 2)  Lisinopril 10 Mg Tabs (Lisinopril) .Marland Kitchen.. 1 Tablet By Mouth Daily 3)  Loratadine 10 Mg Tabs (Loratadine) .... One Every Day 4)  Depo-Provera 150 Mg/ml Susp (Medroxyprogesterone Acetate) .... Im Q 3 Months 5)  One Touch Ultra Glucometer .... Check Sugars Twice A Day and Record Icd9 250.0 6)  One Touch Ultra Test Strips .... Check and Record Blood Sugar Two Times A Day and As Needed Low Sugar Symptoms 7)  Levemir 100 Unit/ml Soln (Insulin Detemir) .... Inject 22 Units of This Long-Acting Insulin Each Evening. 8)  Carvedilol 3.125 Mg Tabs (Carvedilol) .... Take 1 Tablet  By Mouth Every 12 Hours(Per Psych Hosp) 9)  Depakote 500 Mg Tbec (Divalproex Sodium) .... Take 1 Tablet By Mouth Two Times A Day 10)  Benztropine Mesylate 2 Mg Tabs (Benztropine Mesylate) .... Take 1 Tablet By Mouth Two Times A Day 11)  Zolpidem Tartrate 5 Mg Tabs (Zolpidem Tartrate) .... Take 1 Tab By Mouth At Bedtime As Needed For Sleep 12)  Miralax  Powd (Polyethylene Glycol 3350) .Marland Kitchen.. 1 Capful Daily As Needed For Constipation 13)  Pepcid 20 Mg Tabs (Famotidine) .... Take 1 Tablet By Mouth Two Times A Day As Needed For Indigestion  Allergies (verified): 1)  ! Haldol  Past History:  Past Medical History: Last updated:  09/22/2009 Diabetes mellitus, type II Hyperlipidemia Hypertension Seizure disorder Extensive Psychiatric diagnosis    a.  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  Pt. transferred to [facility]    c.  Readmx to [facility] for psychosis with psych consult obtained (09/06/09-09/12/09) Mental Retardation Schizophrenia  Physical Exam  General:  alert, well-developed, and well-nourished.   Head:  normocephalic and atraumatic.   Ears:  R ear normal and L ear normal.   Neck:  supple.   Lungs:  normal breath sounds, no crackles, and no wheezes.   Heart:  normal rate and regular rhythm.   Neurologic:  alert & oriented X3 and cranial nerves II-XII intact.   Psych:  normally interactive.     Impression & Recommendations:  Problem # 1:  DIABETES MELLITUS, TYPE II (ICD-250.00) sees eye dr next mo had A1C of 6 last time needs microalbumin  Her updated medication list for this problem includes:    Lisinopril 10 Mg Tabs (Lisinopril) .Marland Kitchen... 1 tablet by mouth daily    Levemir 100 Unit/ml Soln (Insulin detemir) ..... Inject 22 units of this long-acting insulin each evening.  Problem # 2:  HYPERLIPIDEMIA (ICD-272.4) needs lipids checked return fasting  Her updated medication list for this problem includes:    Zocor 40 Mg Tabs (Simvastatin) .Marland Kitchen... 1 tablet by mouth daily at bedtime  Problem # 3:  HYPERTENSION (ICD-401.9) controlled  Her updated medication list for this problem includes:    Lisinopril 10 Mg Tabs (Lisinopril) .Marland Kitchen... 1 tablet by mouth daily    Carvedilol 3.125 Mg Tabs (Carvedilol) .Marland Kitchen... Take 1 tablet by mouth every 12 hours(per psych hosp)  Problem # 4:  CONTRACEPTIVE MANAGEMENT (ICD-V25.09) needs to reschedule depo last had in December. will set up for last week of March will need pregnancy test   Problem # 5:  UNSPECIFIED SCHIZOPHRENIA CHRONIC CONDITION  (ICD-295.92) concerned about hair loss from the depakote  wants to take vitamins used to take prenatal vitamins  Problem # 6:  VAGINITIS, CANDIDAL (ICD-112.1) give miconazole PV  Complete Medication List: 1)  Zocor 40 Mg Tabs (Simvastatin) .Marland Kitchen.. 1 tablet by mouth daily at bedtime 2)  Lisinopril 10 Mg Tabs (Lisinopril) .Marland Kitchen.. 1 tablet by mouth daily 3)  Loratadine 10 Mg Tabs (Loratadine) .... One every day 4)  Depo-provera 150 Mg/ml Susp (Medroxyprogesterone acetate) .... Im q 3 months 5)  One Touch Ultra Glucometer  .... Check sugars twice a day and record icd9 250.0 6)  One Touch Ultra Test Strips  .... Check and record blood sugar two times a day and as needed low sugar symptoms 7)  Levemir 100 Unit/ml Soln (Insulin detemir) .... Inject 22 units of  this long-acting insulin each evening. 8)  Carvedilol 3.125 Mg Tabs (Carvedilol) .... Take 1 tablet by mouth every 12 hours(per psych hosp) 9)  Depakote 500 Mg Tbec (Divalproex sodium) .... Take 1 tablet by mouth two times a day 10)  Benztropine Mesylate 2 Mg Tabs (Benztropine mesylate) .... Take 1 tablet by mouth two times a day 11)  Zolpidem Tartrate 5 Mg Tabs (Zolpidem tartrate) .... Take 1 tab by mouth at bedtime as needed for sleep 12)  Miralax Powd (Polyethylene glycol 3350) .Marland Kitchen.. 1 capful daily as needed for constipation 13)  Pepcid 20 Mg Tabs (Famotidine) .... Take 1 tablet by mouth two times a day as needed for indigestion 14)  Prenatal Vitamins 0.8 Mg Tabs (Prenatal multivit-min-fe-fa) .... Take 1 tablet by mouth once a day 15)  Miconazole 3 200 Mg Supp (Miconazole nitrate) .Marland Kitchen.. 1 suppository per vagina for 3 days  Other Orders: T-Urine Microalbumin w/creat. ratio [id number]) KOH/ WET Mount [id number])  Hypertension Assessment/Plan:      The patient's hypertensive risk group is category C: Target organ damage and/or diabetes.  Her calculated 10 year risk of coronary heart disease is 1 %.  Today's blood pressure is 101/68.  Her  blood pressure goal is < 130/80.   Patient Instructions: 1)  Return fasting in the next 2 weeks for Lipids (272.4). 2)  Schedule Depo shot for last week in March.  She will need a pregnancy test that day. I have refilled her medicine with [facility]. 3)  Please schedule a follow-up appointment in 3 months with Scott for diabetes and blood pressure.  Prescriptions: MICONAZOLE 3 200 MG SUPP (MICONAZOLE NITRATE) 1 suppository per vagina for 3 days  #3 day supply x 0   Entered and Authorized by:   Tereso Newcomer PA-C   Signed by:   Tereso Newcomer PA-C on 10/28/2009   Method used:   Print then Give to Patient   RxID:   [id number] ZOLPIDEM TARTRATE 5 MG TABS (ZOLPIDEM TARTRATE) Take 1 tab by mouth at bedtime as needed for sleep  #30 x 0   Entered and Authorized by:   Tereso Newcomer PA-C   Signed by:   Tereso Newcomer PA-C on 10/28/2009   Method used:   Printed then faxed to ...       [facility]* (retail)       [address]       [place], [place]  [address]       Ph: [phone]       Fax: [phone]   RxID:   [id number] DEPO-PROVERA 150 MG/ML SUSP (MEDROXYPROGESTERONE ACETATE) im q 3 months  #1 x 3   Entered and Authorized by:   Tereso Newcomer PA-C   Signed by:   Tereso Newcomer PA-C on 10/28/2009   Method used:   Electronically to        [facility]* (retail)       [address]       [place], [place]  [address]       Ph: [phone]       Fax: [phone]   RxID:   [id number] PRENATAL VITAMINS 0.8 MG TABS (PRENATAL MULTIVIT-MIN-FE-FA) Take 1 tablet by mouth once a day  #30 x 11   Entered and Authorized by:   Tereso Newcomer PA-C   Signed by:   Tereso Newcomer PA-C on 10/28/2009   Method  used:   Electronically to        [facility]* (retail)       [address]       [place], [place]  [address]       Ph: [phone]       Fax: [phone]   RxID:   [id number]   Laboratory  Results   Blood Tests     CBG Random:: 158mg /dL    Wet Mount Source: vaginal WBC/hpf: 1-5 Bacteria/hpf: rare Clue cells/hpf: none  Negative whiff Yeast/hpf: few Wet Mount KOH: Negative Trichomonas/hpf: none

## 2010-10-07 NOTE — Progress Notes (Signed)
  Phone Note From Pharmacy   Summary of Call: [facility] to inform us that the psych has d/c her bentropine med Initial call taken by: Armenia Shannon,  October 29, 2009 2:36 PM

## 2010-10-07 NOTE — Letter (Signed)
Summary: HEALTHCARE  APPOINTMENT SUMMARY  HEALTHCARE  APPOINTMENT SUMMARY   Imported By: Arta Bruce 11/04/2009 15:09:51  _____________________________________________________________________  External Attachment:    Type:   Image     Comment:   External Document

## 2010-10-07 NOTE — Letter (Signed)
Summary: CARE MANAGEMENT DEPART.  CARE MANAGEMENT DEPART.   Imported By: Arta Bruce 10/24/2009 12:07:53  _____________________________________________________________________  External Attachment:    Type:   Image     Comment:   External Document

## 2010-10-07 NOTE — Letter (Signed)
Summary: *HSN Results Follow up  [facility]  [address] [place], [place] [address]   Phone: [phone]  Fax: [phone]      09/27/2009   Alexandria Harvey [address] [place], [place]  [address]   Dear  [name],                            ____S.Drinkard,FNP   ____D. Gore,FNP       ____B. McPherson,MD   ____V. Rankins,MD    ____E. Mulberry,MD    ____N. Daphine Deutscher, FNP  ____D. Reche Dixon, MD    ____K. Philipp Deputy, MD    _x___S. Alben Spittle, PA-C     This letter is to inform you that your recent test(s):  _______Pap Smear    ___x____Lab Test     _______X-ray    ___x____ is within acceptable limits  _______ requires a medication change  _______ requires a follow-up lab visit  _______ requires a follow-up visit with your provider   Comments:       _________________________________________________________ If you have any questions, please contact our office                     Sincerely,  Tereso Newcomer PA-C [facility]

## 2010-10-07 NOTE — Assessment & Plan Note (Signed)
Summary: [address] PT.3 MONTH FU ON DIABETES//KT   Vital Signs:  Patient profile:   30 year old female Menstrual status:  depo Weight:      188.19 pounds Temp:     97.2 degrees F oral Pulse rate:   88 / minute Pulse rhythm:   regular Resp:     16 per minute BP sitting:   122 / 70  (left arm) Cuff size:   regular  Vitals Entered By: Hale Drone CMA (July 29, 2010 10:28 AM) CC: Pt. is here for a 3 month f/u on DM. Complaining of a burning sensation when she urinates. This has been going on for about 3 weeks. It goes and comes. Pt. states her urine had a dark color.  Is Patient Diabetic? Yes Did you bring your meter with you today? No Pain Assessment Patient in pain? no      CBG Result 210 CBG Device ID B Non Fasting  Does patient need assistance? Ambulation Normal   Primary Care Provider:  Tereso Newcomer PA-C  CC:  Pt. is here for a 3 month f/u on DM. Complaining of a burning sensation when she urinates. This has been going on for about 3 weeks. It goes and comes. Pt. states her urine had a dark color. .  History of Present Illness: 1.  DM:  A1C has been gradually elevating.  Pt. with a new caregiver in past 2 months, [name] Staley.  Ms. Maryclare Bean feels she needs to work on eating less and making better choices.  Pt. feels she has made changes.  Has been physically active and has lost a bit of weight.  Does not have a short actine insulin--only Levemir.  Previously controlled on Levemir alone.  2.  Burning on urination intermittently for about 3 weeks.  Sounds like much of this is external.  No itching or discharge.  Pt. unable to obtain urine --missed the cup.  May have some urinary frequency.  Urine at times quite dark.  Pt. and caretaker state she is good at drinking water.  Current Medications (verified): 1)  Pravastatin Sodium 80 Mg Tabs (Pravastatin Sodium) .... Take 1 Tab By Mouth At Bedtime For Cholesterol (D/c Simvastatin) 2)  Lisinopril 10 Mg Tabs (Lisinopril) .Marland Kitchen.. 1  Tablet By Mouth Daily 3)  Depo-Provera 150 Mg/ml Susp (Medroxyprogesterone Acetate) .... Im Q 3 Months 4)  One Touch Ultra Glucometer .... Check Sugars Twice A Day and Record Icd9 250.0 5)  One Touch Ultra Test Strips .... Check and Record Blood Sugar Two Times A Day and As Needed Low Sugar Symptoms 6)  Levemir 100 Unit/ml Soln (Insulin Detemir) .... Inject 30 Units At Bedtime (Note Dose Increase) 7)  Carvedilol 3.125 Mg Tabs (Carvedilol) .... Take 1 Tablet By Mouth Every 12 Hours(Per Psych Hosp) 8)  Depakote 500 Mg Tbec (Divalproex Sodium) .... Take 1 Tablet By Mouth Two Times A Day 9)  Zolpidem Tartrate 5 Mg Tabs (Zolpidem Tartrate) .... Take 1 Tab By Mouth At Bedtime As Needed For Sleep 10)  Miralax  Powd (Polyethylene Glycol 3350) .Marland Kitchen.. 1 Capful Daily As Needed For Constipation 11)  Pepcid 20 Mg Tabs (Famotidine) .... Take 1 Tablet By Mouth Two Times A Day 12)  Prenatal Vitamins 0.8 Mg Tabs (Prenatal Multivit-Min-Fe-Fa) .... Take 1 Tablet By Mouth Once A Day 13)  Miconazole 3 200 Mg Supp (Miconazole Nitrate) .Marland Kitchen.. 1 Suppository Per Vagina For 3 Days 14)  Cetirizine Hcl 10 Mg Tabs (Cetirizine Hcl) .... Take 1 Tablet By Mouth  Once A Day For Allergies (Pharmacy: D/c Loratadine) 15)  Flonase 50 Mcg/act Susp (Fluticasone Propionate) .... 2 Sprays Each Nostril Once Daily For Allergies 16)  Insulin Syringe/needle 28g X 1/2" 1 Ml Misc (Insulin Syringe-Needle U-100) .... Use With Levemir Insulin 17)  Geodon 20 Mg Caps (Ziprasidone Hcl) .... Take 2 Capsule By Mouth At Bedtime Daily. 18)  Benztropine Mesylate 1 Mg Tabs (Benztropine Mesylate) .Marland Kitchen.. 1 Tablet By Mouth Twice Daily 19)  Onetouch Ultra System W/device Kit (Blood Glucose Monitoring Suppl) .... Check Sugars As Directed  Allergies (verified): 1)  ! Haldol  Physical Exam  General:  NAD Genitalia:  Moderate inflammation of vulva with white discharge, no odor.     Impression & Recommendations:  Problem # 1:  DIABETES MELLITUS, TYPE II  (ICD-250.00) Not controlled. Add short acting Novolog before meals Needs work on diet--send to [employer] with caretaker. Flu vaccine today  Her updated medication list for this problem includes:    Lisinopril 10 Mg Tabs (Lisinopril) .Marland Kitchen... 1 tablet by mouth daily    Levemir 100 Unit/ml Soln (Insulin detemir) ..... Inject 30 units at bedtime (note dose increase)    Novolog Flexpen 100 Unit/ml Soln (Insulin aspart) .Marland KitchenMarland KitchenMarland KitchenMarland Kitchen 5 units subcutaneously 15 minutes before meals three times a day  Orders: Capillary Blood Glucose/CBG (82948) Hgb A1C ([address]UE)  Problem # 2:  DYSURIA (ICD-788.1) Suspect this is just vaginal yeast infection, but caretaker to bring back urine sample for UA (and culture if abnormal) next week. Pt. unable to get a good urine sample here today. Needs to get sugars better controlled as well.  Problem # 3:  VAGINITIS, CANDIDAL (ICD-112.1)  Her updated medication list for this problem includes:    Fluconazole 150 Mg Tabs (Fluconazole) .Marland Kitchen... 1 tab by mouth daily for 3 days  Complete Medication List: 1)  Pravastatin Sodium 80 Mg Tabs (Pravastatin sodium) .... Take 1 tab by mouth at bedtime for cholesterol (d/c simvastatin) 2)  Lisinopril 10 Mg Tabs (Lisinopril) .Marland Kitchen.. 1 tablet by mouth daily 3)  Depo-provera 150 Mg/ml Susp (Medroxyprogesterone acetate) .... Im q 3 months 4)  One Touch Ultra Glucometer  .... Check sugars twice a day and record icd9 250.0 5)  One Touch Ultra Test Strips  .... Check and record blood sugar two times a day and as needed low sugar symptoms 6)  Levemir 100 Unit/ml Soln (Insulin detemir) .... Inject 30 units at bedtime (note dose increase) 7)  Carvedilol 3.125 Mg Tabs (Carvedilol) .... Take 1 tablet by mouth every 12 hours(per psych hosp) 8)  Depakote 500 Mg Tbec (Divalproex sodium) .... Take 1 tablet by mouth two times a day 9)  Zolpidem Tartrate 5 Mg Tabs (Zolpidem tartrate) .... Take 1 tab by mouth at bedtime as needed for sleep 10)  Miralax Powd  (Polyethylene glycol 3350) .Marland Kitchen.. 1 capful daily as needed for constipation 11)  Pepcid 20 Mg Tabs (Famotidine) .... Take 1 tablet by mouth two times a day 12)  Prenatal Vitamins 0.8 Mg Tabs (Prenatal multivit-min-fe-fa) .... Take 1 tablet by mouth once a day 13)  Miconazole 3 200 Mg Supp (Miconazole nitrate) .Marland Kitchen.. 1 suppository per vagina for 3 days 14)  Cetirizine Hcl 10 Mg Tabs (Cetirizine hcl) .... Take 1 tablet by mouth once a day for allergies (pharmacy: d/c loratadine) 15)  Flonase 50 Mcg/act Susp (Fluticasone propionate) .... 2 sprays each nostril once daily for allergies 16)  Insulin Syringe/needle 28g X 1/2" 1 Ml Misc (Insulin syringe-needle u-100) .... Use with levemir insulin  17)  Geodon 20 Mg Caps (Ziprasidone hcl) .... Take 2 capsule by mouth at bedtime daily. 18)  Benztropine Mesylate 1 Mg Tabs (Benztropine mesylate) .Marland Kitchen.. 1 tablet by mouth twice daily 19)  Onetouch Ultra System W/device Kit (Blood glucose monitoring suppl) .... Check sugars as directed 20)  Novolog Flexpen 100 Unit/ml Soln (Insulin aspart) .... 5 units subcutaneously 15 minutes before meals three times a day 21)  Fluconazole 150 Mg Tabs (Fluconazole) .Marland Kitchen.. 1 tab by mouth daily for 3 days  Other Orders: Influenza Vaccine MCR (21308) UA Dipstick w/o Micro (automated)  (81003)  Patient Instructions: 1)  Follow up with Dr. Delrae Alfred or other provider in 4 months  2)  Referral to [facility] for help with diet 3)  Call if sugars dropping too low or continuing to rise Prescriptions: FLUCONAZOLE 150 MG TABS (FLUCONAZOLE) 1 tab by mouth daily for 3 days  #3 x 0   Entered and Authorized by:   Julieanne Manson MD   Signed by:   Julieanne Manson MD on 07/29/2010   Method used:   Electronically to        [facility]* (retail)       [address]       [place], [place]  [address]       Ph: [phone]       Fax: [phone]   RxID:   [id number] NOVOLOG FLEXPEN 100 UNIT/ML SOLN  (INSULIN ASPART) 5 units subcutaneously 15 minutes before meals three times a day  #1 month x 11   Entered and Authorized by:   Julieanne Manson MD   Signed by:   Julieanne Manson MD on 07/29/2010   Method used:   Electronically to        [facility]* (retail)       [address]       [place], [place]  [address]       Ph: [phone]       Fax: [phone]   RxID:   [id number]    Orders Added: 1)  Capillary Blood Glucose/CBG [82948] 2)  Hgb A1C [83036QW] 3)  Influenza Vaccine MCR [00025] 4)  UA Dipstick w/o Micro (automated)  [81003]   Immunizations Administered:  Influenza Vaccine # 1:    Vaccine Type: Fluvax MCR    Site: left deltoid    Mfr: GlaxoSmithKline    Dose: 0.5 ml    Route: IM    Given by: Hale Drone CMA    Exp. Date: 03/07/2011    Lot #: [id number]    VIS given: 04/01/10 version given July 29, 2010.  Flu Vaccine Consent Questions:    Do you have a history of severe allergic reactions to this vaccine? no    Any prior history of allergic reactions to egg and/or gelatin? no    Do you have a sensitivity to the preservative Thimersol? no    Do you have a past history of Guillan-Barre Syndrome? no    Do you currently have an acute febrile illness? no    Have you ever had a severe reaction to latex? no    Vaccine information given and explained to patient? yes    Are you currently pregnant? no   Immunizations Administered:  Influenza Vaccine # 1:    Vaccine Type: Fluvax MCR    Site: left deltoid    Mfr: GlaxoSmithKline  Dose: 0.5 ml    Route: IM    Given by: Hale Drone CMA    Exp. Date: 03/07/2011    Lot #: [id number]    VIS given: 04/01/10 version given July 29, 2010.   Laboratory Results   Blood Tests   Date/Time Received: July 29, 2010 10:57 AM   HGBA1C: 8.5%   (Normal Range: Non-Diabetic - 3-6%   Control Diabetic - 6-8%) CBG Random:: 210mg /dL     Appended Document: Needs  U/A    Clinical Lists Changes  Orders: Added new Service order of UA Dipstick w/o Micro (automated)  (81003) - Signed Observations: Added new observation of PH URINE: 6.5  (08/05/2010 10:31) Added new observation of SPEC GR URIN: <1.005  (08/05/2010 10:31) Added new observation of UA COLOR: lt. yellow  (08/05/2010 10:31) Added new observation of WBC DIPSTK U: negative  (08/05/2010 10:31) Added new observation of NITRITE URN: negative  (08/05/2010 10:31) Added new observation of UROBILINOGEN: 0.2  (08/05/2010 10:31) Added new observation of PROTEIN, URN: negative  (08/05/2010 10:31) Added new observation of BLOOD UR DIP: negative  (08/05/2010 10:31) Added new observation of KETONES URN: negative  (08/05/2010 10:31) Added new observation of BILIRUBIN UR: negative  (08/05/2010 10:31) Added new observation of GLUCOSE, URN: negative  (08/05/2010 10:31) Added new observation of COMMENTS: Blood in toilet and when she wipes. Unable to get U/A. Currently on Depo could be starting her period. Gave care giver a urine hat to collect and advised to bring in ASAP.  (08/05/2010 10:31)      Laboratory Results   Urine Tests  Date/Time Received: August 06, 2010 9:25 AM    Routine Urinalysis   Color: lt. yellow Glucose: negative   (Normal Range: Negative) Bilirubin: negative   (Normal Range: Negative) Ketone: negative   (Normal Range: Negative) Spec. Gravity: <1.005   (Normal Range: 1.003-1.035) Blood: negative   (Normal Range: Negative) pH: 6.5   (Normal Range: 5.0-8.0) Protein: negative   (Normal Range: Negative) Urobilinogen: 0.2   (Normal Range: 0-1) Nitrite: negative   (Normal Range: Negative) Leukocyte Esterace: negative   (Normal Range: Negative)    Comments: Blood in toilet and when she wipes. Unable to get U/A. Currently on Depo could be starting her period. Gave care giver a urine hat to collect and advised to bring in ASAP.

## 2010-10-07 NOTE — Progress Notes (Signed)
Phone Note Outgoing Call   Summary of Call: Received correspondence from [facility] in [place] [phone]) about a medicine named Saphris.  They want to know if it should be d/c or changed to an alternate drug that her insurance will pay for.  This is a medicine prescribed by her psychiatrist and I would suggest that Alexandria Harvey call the patient's psychiatrist to get this question answered.  Also, see if her psychiatrist can take over refilling her psych meds ( benztropine, zolpidem and the Saprhis . . . if that is to be continued) Initial call taken by: Brynda Rim,  October 02, 2009 8:21 AM  Follow-up for Phone Call        spoke with pharmacy and they are aware Follow-up by: Armenia Shannon,  October 07, 2009 9:58 AM    New/Updated Medications: ZOCOR 40 MG TABS (SIMVASTATIN) 1 tablet by mouth daily at bedtime LISINOPRIL 10 MG TABS (LISINOPRIL) 1 tablet by mouth daily Prescriptions: ZOLPIDEM TARTRATE 5 MG TABS (ZOLPIDEM TARTRATE) Take 1 tab by mouth at bedtime as needed for sleep  #30 x 0   Entered and Authorized by:   Tereso Newcomer PA-C   Signed by:   Tereso Newcomer PA-C on 10/02/2009   Method used:   Printed then faxed to ...       [facility]* (retail)       [address]       [place], [place]  [address]       Ph: [phone]       Fax: [phone]   RxID:   [id number] LISINOPRIL 10 MG TABS (LISINOPRIL) 1 tablet by mouth daily  #30 x 5   Entered and Authorized by:   Tereso Newcomer PA-C   Signed by:   Tereso Newcomer PA-C on 10/02/2009   Method used:   Electronically to        [facility]* (retail)       [address]       [place], [place]  [address]       Ph: [phone]       Fax: [phone]   RxID:   [id number] ZOCOR 40 MG TABS (SIMVASTATIN) 1 tablet by mouth daily at bedtime  #30 x 5   Entered and Authorized by:   Tereso Newcomer PA-C   Signed by:   Tereso Newcomer PA-C on 10/02/2009   Method used:    Electronically to        [facility]* (retail)       [address]       [place], [place]  [address]       Ph: [phone]       Fax: [phone]   RxID:   [id number] BENZTROPINE MESYLATE 2 MG TABS (BENZTROPINE MESYLATE) Take 1 tablet by mouth two times a day  #60 x 5   Entered and Authorized by:   Tereso Newcomer PA-C   Signed by:   Tereso Newcomer PA-C on 10/02/2009   Method used:   Electronically to        [facility]* (retail)       [address]       [place], [place]  [address]       Ph: [phone]  Fax: [phone]   RxID:   [id number] DEPAKOTE 500 MG TBEC (DIVALPROEX SODIUM) Take 1 tablet by mouth two times a day  #60 x 5   Entered and Authorized by:   Tereso Newcomer PA-C   Signed by:   Tereso Newcomer PA-C on 10/02/2009   Method used:   Electronically to        [facility]* (retail)       [address]       [place], [place]  [address]       Ph: [phone]       Fax: [phone]   RxID:   [id number] CARVEDILOL 3.125 MG TABS (CARVEDILOL) Take 1 tablet by mouth every 12 hours(per Psych hosp)  #60 x 5   Entered and Authorized by:   Tereso Newcomer PA-C   Signed by:   Tereso Newcomer PA-C on 10/02/2009   Method used:   Electronically to        [facility]* (retail)       [address]       [place], [place]  [address]       Ph: [phone]       Fax: [phone]   RxID:   [id number] LEVEMIR 100 UNIT/ML SOLN (INSULIN DETEMIR) Inject 22 units of this long-acting insulin each evening.  #1 mo. supply x 11   Entered and Authorized by:   Tereso Newcomer PA-C   Signed by:   Tereso Newcomer PA-C on 10/02/2009   Method used:   Electronically to        [facility]* (retail)       [address]       [place], [place]  [address]       Ph: [phone]       Fax: [phone]   RxID:   [id number] LORATADINE 10 MG  TABS (LORATADINE) one every day  #30 x 5   Entered and Authorized by:   Tereso Newcomer PA-C   Signed by:   Tereso Newcomer PA-C on 10/02/2009   Method used:   Electronically to        [facility]* (retail)       [address]       [place], [place]  [address]       Ph: [phone]       Fax: [phone]   RxID:   [id number]

## 2010-10-07 NOTE — Letter (Signed)
Summary: *HSN Results Follow up  [facility]  [address] [place], [place] [address]   Phone: [phone]  Fax: [phone]      10/29/2009   Alexandria Harvey [address] [place], [place]  [address]   Dear  [name],                            ____S.Drinkard,FNP   ____D. Gore,FNP       ____B. McPherson,MD   ____V. Rankins,MD    ____E. Mulberry,MD    ____N. Daphine Deutscher, FNP  ____D. Reche Dixon, MD    ____K. Philipp Deputy, MD    __x__S. Alben Spittle, PA-C     This letter is to inform you that your recent test(s):  _______Pap Smear    _______Lab Test     _______X-ray    _______ is within acceptable limits  _______ requires a medication change  _______ requires a follow-up lab visit  _______ requires a follow-up visit with your provider   Comments: Urine test was negative for protein in your urine.       _________________________________________________________ If you have any questions, please contact our office                     Sincerely,  Tereso Newcomer PA-C [facility]

## 2010-10-07 NOTE — Progress Notes (Signed)
  Phone Note Call from Patient      New/Updated Medications: ONETOUCH ULTRA SYSTEM W/DEVICE KIT (BLOOD GLUCOSE MONITORING SUPPL) check sugars as directed Prescriptions: ONETOUCH ULTRA SYSTEM W/DEVICE KIT (BLOOD GLUCOSE MONITORING SUPPL) check sugars as directed  #1 x 0   Entered by:   Armenia Shannon   Authorized by:   Tereso Newcomer PA-C   Signed by:   Armenia Shannon on 05/22/2010   Method used:   Print then Give to Patient   RxID:   [id number]

## 2010-10-07 NOTE — Progress Notes (Signed)
Summary: REFILLS AND MEDICAL MESSAGE  Phone Note Call from Patient Call back at [phone]   Summary of Call: CONCESSA tURNER, WHO IS THE CARE PROVIDER HELP, CALLED ON BEHALF THE PT ABOVE BECAUSE THE PT NEEDS MORE REFILL FROM INSULIN.  THE PROVIDER CAN FAX THE REQUEST THE INSULIN REFILLS TO [facility] BECAUSE THE MEDICAID AND MEDICARE CAN PAY FOR IT.  ALSO SHE WANTED TO SHARE WITH THE PROVIDER THAT THE BLOOD SUGAR LEVEL DURING THE MORNING RANGE IN BETWEEN 120 OR 130 BUT AT NIGHT RANGE ABOVE 200 LEVEL. [name] PA-C  Initial call taken by: Manon Hilding,  April 15, 2010 11:42 AM  Follow-up for Phone Call        Sent to S. [name].  Dutch Quint RN  April 15, 2010 12:07 PM   Additional Follow-up for Phone Call Additional follow up Details #1::        wil check A1C at f/u in 2 weeks if necessary, will adjust dose  Additional Follow-up by: Brynda Rim,  April 15, 2010 8:40 PM    Additional Follow-up for Phone Call Additional follow up Details #2::    Caregiver has an order for a sliding scale, but she's getting 22 units of Levemir at night.  Last night, her sugar went up to 254 and this morning it was 127.    Is she just needing the sliding scale at night?  Wants to know if her insulin needs adjusting.  Advised of provider's response re f/u A1C at next visit.  She wanted to know if anything needed to be changed until then.  Also, needs refills/Rx for NEEDLES, not solution.   Dutch Quint RN  April 16, 2010 9:01 AM   Let me f/u on it at appt in 2 weeks. Keep checking. Call if any reading 400 or higher I don't have SSI listed in her meds. We can look at her A1C then and decide what to do. Tereso Newcomer PA-C  April 16, 2010 2:00 PM   Additional Follow-up for Phone Call Additional follow up Details #3:: Details for Additional Follow-up Action Taken: Advised of provider's response and recommendations, new Rx for needles.  Verbalized agreement - no further questions.   Dutch Quint RN  April 18, 2010 3:53 PM   New/Updated Medications: SURE COMFORT PEN NEEDLES 31G X 8 MM MISC (INSULIN PEN NEEDLE) use with Levemir flexpen Prescriptions: SURE COMFORT PEN NEEDLES 31G X 8 MM MISC (INSULIN PEN NEEDLE) use with Levemir flexpen  #1 mo supply x 11   Entered and Authorized by:   Tereso Newcomer PA-C   Signed by:   Tereso Newcomer PA-C on 04/18/2010   Method used:   Electronically to        [facility]* (retail)       [address]       [place], [place]  [address]       Ph: [phone]       Fax: [phone]   RxID:   [id number] LEVEMIR 100 UNIT/ML SOLN (INSULIN DETEMIR) Inject 22 units of this long-acting insulin each evening.  #1 mo. supply x 11   Entered and Authorized by:   Julieanne Manson MD   Signed by:   Julieanne Manson MD on 04/15/2010   Method used:   Electronically to        [facility]* (retail)       [address]  [place], [place]  [address]       Ph: [phone]       Fax: [phone]   RxID:   [id number] LEVEMIR 100 UNIT/ML SOLN (INSULIN DETEMIR) Inject 22 units of this long-acting insulin each evening.  #1 mo. supply x 11   Entered by:   Julieanne Manson MD   Authorized by:   Tereso Newcomer PA-C   Signed by:   Julieanne Manson MD on 04/15/2010   Method used:   Electronically to        [facility]* (retail)       [address]       [place], [place]  [address]       Ph: [phone]       Fax: [phone]   RxID:   [id number]  Did not have authorization correct first attempt

## 2010-10-07 NOTE — Progress Notes (Signed)
  Phone Note Outgoing Call   Summary of Call: Insurance will not cover Crestor. Change simvastatin from 40 to 80 mg at bedtime. Check FLP and LFTs in 3 mos. Initial call taken by: Brynda Rim,  November 20, 2009 4:31 PM  Follow-up for Phone Call        done Follow-up by: Armenia Shannon,  November 20, 2009 4:43 PM    New/Updated Medications: SIMVASTATIN 80 MG TABS (SIMVASTATIN) Take 1 tab by mouth at bedtime for cholesterol (pharmacy note that Crestor changed to simvastatin) Prescriptions: SIMVASTATIN 80 MG TABS (SIMVASTATIN) Take 1 tab by mouth at bedtime for cholesterol (pharmacy note that Crestor changed to simvastatin)  #30 x 5   Entered and Authorized by:   Tereso Newcomer PA-C   Signed by:   Tereso Newcomer PA-C on 11/20/2009   Method used:   Printed then faxed to ...       [facility]* (retail)       [address]       [place], [place]  [address]       Ph: [phone]       Fax: [phone]   RxID:   [id number]

## 2010-10-07 NOTE — Letter (Signed)
Summary: [facility] PERMISSION FORM  [facility] PERMISSION FORM   Imported By: Arta Bruce 12/23/2009 11:15:27  _____________________________________________________________________  External Attachment:    Type:   Image     Comment:   External Document

## 2010-10-07 NOTE — Letter (Signed)
Summary: APPT SUMMARY  APPT SUMMARY   Imported By: Arta Bruce 01/20/2010 11:57:27  _____________________________________________________________________  External Attachment:    Type:   Image     Comment:   External Document

## 2010-10-07 NOTE — Letter (Signed)
Summary: HEALTHCARE APPT SUMMARY  HEALTHCARE APPT SUMMARY   Imported By: Arta Bruce 04/29/2010 09:46:05  _____________________________________________________________________  External Attachment:    Type:   Image     Comment:   External Document

## 2010-10-07 NOTE — Letter (Signed)
Summary: HOSPITAL PROGRESS NOTE  HOSPITAL PROGRESS NOTE   Imported By: Arta Bruce 10/01/2009 14:40:58  _____________________________________________________________________  External Attachment:    Type:   Image     Comment:   External Document

## 2010-10-07 NOTE — Letter (Signed)
Summary: BLOOD GLUCOSE RECORDS  BLOOD GLUCOSE RECORDS   Imported By: Arta Bruce 02/07/2010 12:36:27  _____________________________________________________________________  External Attachment:    Type:   Image     Comment:   External Document

## 2010-10-07 NOTE — Letter (Signed)
Summary: CASE MANAGEMENT DEPT  CASE MANAGEMENT DEPT   Imported ByArta Bruce 09/25/2009 14:54:57  _____________________________________________________________________  External Attachment:    Type:   Image     Comment:   External Document

## 2010-10-07 NOTE — Letter (Signed)
Summary: HEALTHCARE APPOINTMENT [facility]MY  HEALTHCARE APPOINTMENT [facility]   Imported By: Arta Bruce 02/26/2010 14:42:20  _____________________________________________________________________  External Attachment:    Type:   Image     Comment:   External Document

## 2010-10-07 NOTE — Medication Information (Signed)
Summary: physicians order sheet//[facility]  physicians order sheet//[facility]   Imported By: Arta Bruce 11/01/2009 10:37:20  _____________________________________________________________________  External Attachment:    Type:   Image     Comment:   External Document

## 2010-10-07 NOTE — Assessment & Plan Note (Signed)
Summary: OV FOR DM CHECK/////KT   Vital Signs:  Patient profile:   30 year old female Menstrual status:  depo Weight:      190.50 pounds Temp:     97.8 degrees F Pulse rate:   82 / minute Pulse rhythm:   regular Resp:     16 per minute BP sitting:   112 / 75  (left arm) Cuff size:   regular  Vitals Entered By: Chauncy Passy, [place].Oto  CC: Pt. is here for a DM f/u . Has some allergy concners. Pt. has been itching around her vagina recently. Last Friday, she woke up w/a sinus headache and earache. , Hypertension Management Is Patient Diabetic? Yes Did you bring your meter with you today? No Pain Assessment Patient in pain? yes     Location: left ear Intensity: 6 Type: sharp Onset of pain  Intermittent CBG Result 161  Does patient need assistance? Functional Status Self care Ambulation Normal   Primary Care Provider:  Tereso Newcomer PA-C  CC:  Pt. is here for a DM f/u . Has some allergy concners. Pt. has been itching around her vagina recently. Last Friday, she woke up w/a sinus headache and earache. , and Hypertension Management.  History of Present Illness: Here for DM f/u.  Caregiver has some concerns over her sugars going up. Her A1C is 7 today.  It was 6 in [name].  She admits to dietary noncompliance.  She snacks more than she should and she gets samples when she is at the store.  She say she cannot control herself.  Notes increasing problems with allergies.  Has rhinorrhea and nasal congestion.  Notes some otalgia and sinus pressure.  No cough.  No fevers.  No sore throat.  No wheezing.  Also notes vaginal itching.  Treated for yeast infection last visit.  Notes some improvement but itching continues.  No discharge noted.  No burning or redness noted.  She denies being sexually active.    Hypertension History:      Positive major cardiovascular risk factors include diabetes, hyperlipidemia, and hypertension.  Negative major cardiovascular risk factors include female age  less than 81 years old and non-tobacco-user status.    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 Take 1 Tab By Mouth At Bedtime For Cholesterol (Pharmacy Note That Crestor Changed To Simvastatin) 2)  Lisinopril 10 Mg Tabs (Lisinopril) .Marland Kitchen.. 1 Tablet By Mouth Daily 3)  Loratadine 10 Mg Tabs (Loratadine) .... One Every Day 4)  Depo-Provera 150 Mg/ml Susp (Medroxyprogesterone Acetate) .... Im Q 3 Months 5)  One Touch Ultra Glucometer .... Check Sugars Twice A Day and Record Icd9 250.0 6)  One Touch Ultra Test Strips .... Check and Record Blood Sugar Two Times A Day and As Needed Low Sugar Symptoms 7)  Levemir 100 Unit/ml Soln (Insulin Detemir) .... Inject 22 Units  of This Long-Acting Insulin Each Evening. 8)  Carvedilol 3.125 Mg Tabs (Carvedilol) .... Take 1 Tablet By Mouth Every 12 Hours(Per Psych Hosp) 9)  Depakote 500 Mg Tbec (Divalproex Sodium) .... Take 1 Tablet By Mouth Two Times A Day 10)  Zolpidem Tartrate 5 Mg Tabs (Zolpidem Tartrate) .... Take 1 Tab By Mouth At Bedtime As Needed For Sleep 11)  Miralax  Powd (Polyethylene Glycol 3350) .Marland Kitchen.. 1 Capful Daily As Needed For Constipation 12)  Pepcid 20 Mg Tabs (Famotidine) .... Take 1 Tablet By Mouth Two Times A Day As Needed For Indigestion 13)  Prenatal  Vitamins 0.8 Mg Tabs (Prenatal Multivit-Min-Fe-Fa) .... Take 1 Tablet By Mouth Once A Day 14)  Miconazole 3 200 Mg Supp (Miconazole Nitrate) .Marland Kitchen.. 1 Suppository Per Vagina For 3 Days  Allergies (verified): 1)  ! Haldol  Past History:  Past Medical History: Last updated: 09/22/2009 Diabetes mellitus, type II Hyperlipidemia Hypertension Seizure disorder Extensive Psychiatric diagnosis    a.  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  Pt. transferred to [facility]    c.  Readmx to [facility] for psychosis with psych consult obtained (09/06/09-09/12/09) Mental Retardation Schizophrenia  Physical Exam  General:  alert, well-developed, and well-nourished.   Head:  normocephalic and atraumatic.   Eyes:  pupils equal, pupils round, and pupils reactive to light.   Ears:  R ear normal and L ear normal.   Nose:  no external deformity and mucosal erythema.   Mouth:  pharynx pink and moist.   Neck:  supple.   Lungs:  normal breath sounds, no crackles, and no wheezes.   Heart:  normal rate and regular rhythm.   Abdomen:  soft and non-tender.   Neurologic:  alert & oriented X3 and cranial nerves II-XII intact.   Psych:  normally interactive.     Impression & Recommendations:  Problem # 1:  DIABETES MELLITUS, TYPE II (ICD-250.00) A1C now 7 she has had several readings in the 300 range will refer to dietician increase Levemir to 25 units  Her updated medication list for this problem includes:    Lisinopril 10 Mg Tabs (Lisinopril) .Marland Kitchen... 1 tablet by mouth daily    Levemir 100 Unit/ml Soln (Insulin detemir) ..... Inject 22 units of this long-acting insulin each evening.  Orders: Capillary Blood Glucose/CBG (82948) Hgb A1C (04540JW) Diabetic Clinic Referral (Diabetic)  Problem # 2:  UNSPECIFIED SCHIZOPHRENIA CHRONIC CONDITION (ICD-295.92) caregiver notes mood swings    asked patient to f/u with psychiatry  Problem # 3:  ALLERGIC RHINITIS (ICD-477.9) d/c loratadine start zyrtec and flonase  The following medications were removed from the medication list:    Loratadine 10 Mg Tabs (Loratadine) ..... One every day Her updated medication list for this problem includes:    Cetirizine Hcl 10 Mg Tabs (Cetirizine hcl) .Marland Kitchen... Take 1 tablet by mouth once a day for allergies (pharmacy: d/c loratadine)    Flonase 50 Mcg/act Susp (Fluticasone propionate) .Marland Kitchen... 1-2 sprays each nostril once daily for allergies  Problem # 4:  PRURITUS, VAGINAL (ICD-698.1) wet prep neg discussed hygiene  Complete Medication List: 1)  Simvastatin 80 Mg Tabs (Simvastatin) .... Take 1 tab by mouth at bedtime for cholesterol (pharmacy note that crestor changed to simvastatin) 2)  Lisinopril 10 Mg Tabs (Lisinopril) .Marland Kitchen.. 1 tablet by mouth daily 3)  Depo-provera 150 Mg/ml Susp (Medroxyprogesterone acetate) .... Im  q 3 months 4)  One Touch Ultra Glucometer  .... Check sugars twice a day and record icd9 250.0 5)  One Touch Ultra Test Strips  .... Check and record blood sugar two times a day and as needed low sugar symptoms 6)  Levemir 100 Unit/ml Soln (Insulin detemir) .... Inject 22 units of this long-acting insulin each evening. 7)  Carvedilol 3.125 Mg Tabs (Carvedilol) .... Take 1 tablet by mouth every 12 hours(per psych hosp) 8)  Depakote 500 Mg Tbec (Divalproex sodium) .... Take 1 tablet by mouth two times a day 9)  Zolpidem Tartrate 5 Mg Tabs (Zolpidem tartrate) .... Take 1 tab by mouth at bedtime as needed for sleep 10)  Miralax Powd (Polyethylene glycol 3350) .Marland Kitchen.. 1 capful daily as needed for constipation 11)  Pepcid 20 Mg Tabs (Famotidine) .... Take 1 tablet by mouth two times a day as needed for indigestion 12)  Prenatal Vitamins 0.8 Mg Tabs (Prenatal multivit-min-fe-fa) .... Take 1 tablet by mouth once a day 13)  Miconazole 3 200 Mg Supp (Miconazole nitrate) .Marland Kitchen.. 1 suppository per  vagina for 3 days 14)  Cetirizine Hcl 10 Mg Tabs (Cetirizine hcl) .... Take 1 tablet by mouth once a day for allergies (pharmacy: d/c loratadine) 15)  Flonase 50 Mcg/act Susp (Fluticasone propionate) .Marland Kitchen.. 1-2 sprays each nostril once daily for allergies  Hypertension Assessment/Plan:      The patient's hypertensive risk group is category C: Target organ damage and/or diabetes.  Her calculated 10 year risk of coronary heart disease is 2 %.  Today's blood pressure is 112/75.  Her blood pressure goal is < 130/80.   Patient Instructions: 1)  Please schedule a follow-up appointment in 1 month with Lurene Robley for CPP. 2)  Increase Levemir to 25 units at bedtime. 3)  Stop the Loratadine (Claritin) 4)  Start Zyrtec (Cetirizine) and take once daily for allergies. 5)  Use the flonase as follows: 6)  Use foe 3 weeks, then take a week off, then for 3 weeks again, etc.  Use saline nose spray (can get over the counter) as needed.  Do not use the saline nose spray at the same time as the Flonase. Prescriptions: FLONASE 50 MCG/ACT SUSP (FLUTICASONE PROPIONATE) 1-2 sprays each nostril once daily for allergies  #1 x 2   Entered and Authorized by:   Tereso Newcomer PA-C   Signed by:   Tereso Newcomer PA-C on 12/19/2009   Method used:   Electronically to        [facility]* (retail)       [address]       [place], [place]  [address]       Ph: [phone]       Fax: [phone]   RxID:   [id number] CETIRIZINE HCL 10 MG TABS (CETIRIZINE HCL) Take 1 tablet by mouth once a day for allergies (pharmacy: d/c loratadine)  #30 x 5   Entered and Authorized by:   Tereso Newcomer PA-C   Signed by:   Tereso Newcomer PA-C on 12/19/2009   Method used:   Electronically to        [facility]* (retail)       [address]       [place], [place]  [address]       Ph: [phone]       Fax: [phone]   RxID:  [id number]   Laboratory Results   Blood  Tests     HGBA1C: 7.0%   (Normal Range: Non-Diabetic - 3-6%   Control Diabetic - 6-8%) CBG Random:: 161mg /dL    Wet Mount Source: vaginal WBC/hpf: 1-5 Bacteria/hpf: rare Clue cells/hpf: none  Negative whiff Yeast/hpf: none Wet Mount KOH: Negative Trichomonas/hpf: none

## 2010-10-07 NOTE — Miscellaneous (Signed)
  Clinical Lists Changes  Medications: Changed medication from NOVOLOG FLEXPEN 100 UNIT/ML SOLN (INSULIN ASPART) 5 units subcutaneously 15 minutes before meals three times a day to HUMALOG KWIKPEN 100 UNIT/ML SOLN (INSULIN LISPRO (HUMAN)) 5 units subcutaneously 15 minutes before each meal three times a day - Signed Rx of HUMALOG KWIKPEN 100 UNIT/ML SOLN (INSULIN LISPRO (HUMAN)) 5 units subcutaneously 15 minutes before each meal three times a day;  #1 month x 11;  Signed;  Entered by: Julieanne Manson MD;  Authorized by: Julieanne Manson MD;  Method used: Electronically to [facility]*, [address] [place], [place], [place]  [address], Ph: [phone], Fax: [phone]  Pt. unable to get Novolog--not covered by her Novolog  Prescriptions: HUMALOG KWIKPEN 100 UNIT/ML SOLN (INSULIN LISPRO (HUMAN)) 5 units subcutaneously 15 minutes before each meal three times a day  #1 month x 11   Entered and Authorized by:   Julieanne Manson MD   Signed by:   Julieanne Manson MD on 08/12/2010   Method used:   Electronically to        [facility]* (retail)       [address]       [place], [place]  [address]       Ph: [phone]       Fax: [phone]   RxID:   [id number]

## 2010-10-09 ENCOUNTER — Telehealth (INDEPENDENT_AMBULATORY_CARE_PROVIDER_SITE_OTHER): Payer: Self-pay | Admitting: Internal Medicine

## 2010-10-09 NOTE — Progress Notes (Signed)
Summary: REFILL ON BP MEDS-Carvedilol and Lisinopril and Depo-Provera  Phone Note Call from Patient Call back at [phone]   Caller: [name]-CARE GIVER Reason for Call: Refill Medication Summary of Call: [name] CALLED AND SAYS THAT SHE HAS BEEN TRYING TO GET Alexandria Harvey 2 BP MEDS FILLED FOR 2 WEEKS AT [facility] AND SHE IS COMPLETELY OUT. Initial call taken by: Leodis Rains,  September 30, 2010 9:10 AM  Follow-up for Phone Call        Unable to get through on caregiver's phone.  Left message on pt.'s listed voicemail for pt./caregiver to return call.  Refills completed.  Dutch Quint RN  September 30, 2010 9:59 AM   Additional Follow-up for Phone Call Additional follow up Details #1::        DAPHN CALLED BACK WASN'T SURE IF YOU HAD THE RIGHT NUMBER [phone]//PLEASE GIVE HER A CALL BACK Additional Follow-up by: Arta Bruce,  September 30, 2010 2:41 PM    Additional Follow-up for Phone Call Additional follow up Details #2::    Unable to contact [facility] - number she provided only has fast busy signal.  Dutch Quint RN  September 30, 2010 5:22 PM  Advised Bard Herbert of refills.  Also needs Depo refilled.  Dutch Quint RN  October 01, 2010 9:40 AM  Okay to refill Depo as well--she needs to make an appt. for CPP May/June  Julieanne Manson MD  October 01, 2010 1:45 PM   Depo refilled - Left message on voicemail for pt. to return call.  Dutch Quint RN  October 02, 2010 11:11 AM  [name] advised of refills and need to call for May or June CPP.  Will make appt. at pt.'s next appt. in February.  Made aware that pt. will not be able to get refills for Depo past May/June.  Verbalized understanding and agreement.  Dutch Quint RN  October 02, 2010 11:25 AM   Prescriptions: DEPO-PROVERA 150 MG/ML SUSP (MEDROXYPROGESTERONE ACETATE) im q 3 months  #1 x 1   Entered by:   Dutch Quint RN   Authorized by:   Julieanne Manson MD   Signed by:   Dutch Quint RN on 10/02/2010   Method used:    Electronically to        [facility]* (retail)       [address]       [place], [place]  [address]       Ph: [phone]       Fax: [phone]   RxID:   [id number] CARVEDILOL 3.125 MG TABS (CARVEDILOL) Take 1 tablet by mouth every 12 hours(per Psych hosp)  #60 x 3   Entered by:   Dutch Quint RN   Authorized by:   Julieanne Manson MD   Signed by:   Dutch Quint RN on 09/30/2010   Method used:   Electronically to        [facility]* (retail)       [address]       [place], [place]  [address]       Ph: [phone]       Fax: [phone]   RxID:   [id number] LISINOPRIL 10 MG TABS (LISINOPRIL) 1 tablet by mouth daily  #30 x 3   Entered by:   Dutch Quint RN   Authorized by:   Julieanne Manson MD   Signed by:   Dutch Quint  RN on 09/30/2010   Method used:   Electronically to        [facility]* (retail)       [address]       [place], [place]  [address]       Ph: [phone]       Fax: [phone]   RxID:   [id number]

## 2010-10-09 NOTE — Letter (Signed)
Summary: NUTRITION SUMMARY//[facility]andria Harvey  NUTRITION SUMMARY//Alexandria Harvey   Imported By: Arta Bruce 09/09/2010 16:23:57  _____________________________________________________________________  External Attachment:    Type:   Image     Comment:   External Document

## 2010-10-09 NOTE — Letter (Signed)
Summary: NUTRITION SUMMARY//[facility]  NUTRITION SUMMARY//[facility]   Imported By: Arta Bruce 09/09/2010 16:22:38  _____________________________________________________________________  External Attachment:    Type:   Image     Comment:   External Document

## 2010-10-09 NOTE — Progress Notes (Signed)
Summary: Needs Rx for needles/pentips  Phone Note From Pharmacy   Summary of Call: Received request from [facility] that pt. needs Rx for needles or pentips to go with Humalog.   Initial call taken by: Dutch Quint RN,  August 19, 2010 9:26 AM    New/Updated Medications: * HUMALOG [facility] TIPS/NEEDLES Use with Stephanie Coup Humalog injections three times a day Prescriptions: HUMALOG KWIKPEN TIPS/NEEDLES Use with Kwikpen Humalog injections three times a day  #100 x 11   Entered and Authorized by:   Julieanne Manson MD   Signed by:   Julieanne Manson MD on 08/22/2010   Method used:   Printed then faxed to ...       [facility]* (retail)       [address]       [place], [place]  [address]       Ph: [phone]       Fax: [phone]   RxID:   [id number]

## 2010-10-09 NOTE — Letter (Signed)
Summary: MAILED REQUESTED RECORDS TO [facility]  MAILED REQUESTED RECORDS TO [facility]   Imported By: Arta Bruce 10/02/2010 10:42:12  _____________________________________________________________________  External Attachment:    Type:   Image     Comment:   External Document

## 2010-10-09 NOTE — Medication Information (Signed)
Summary: [facility]/WILL NOT PAY FOR NOVALOG  [facility]/WILL NOT PAY FOR NOVALOG   Imported By: Arta Bruce 08/19/2010 14:20:49  _____________________________________________________________________  External Attachment:    Type:   Image     Comment:   External Document

## 2010-10-15 NOTE — Progress Notes (Signed)
Summary: Change of insulin  Phone Note From Pharmacy   Caller: [facility]* Summary of Call: Theresa--can you check with [facility] pharmacy--they are asking [place] to switch her to Humalog instead of Novalog--which was done in [facility] to see if they did not receive please.  Julieanne Manson MD  October 09, 2010 4:41 PM   Follow-up for Phone Call        Called [facility] - they're not sure why it was requested, they do have record of change to Humalog.  Someone else sent the request, so they will check tomorrow and get back to [place].  But they did receive the 08/12/10 change of insulin. Follow-up by: Dutch Quint RN,  October 09, 2010 5:15 PM

## 2010-10-23 ENCOUNTER — Telehealth (INDEPENDENT_AMBULATORY_CARE_PROVIDER_SITE_OTHER): Payer: Self-pay | Admitting: Internal Medicine

## 2010-10-23 ENCOUNTER — Encounter (INDEPENDENT_AMBULATORY_CARE_PROVIDER_SITE_OTHER): Payer: Self-pay | Admitting: Internal Medicine

## 2010-10-23 LAB — CONVERTED CEMR LAB: Whiff Test: NEGATIVE

## 2010-11-04 ENCOUNTER — Encounter (INDEPENDENT_AMBULATORY_CARE_PROVIDER_SITE_OTHER): Payer: Self-pay | Admitting: Internal Medicine

## 2010-11-07 ENCOUNTER — Encounter: Payer: Self-pay | Admitting: Physician Assistant

## 2010-11-07 ENCOUNTER — Telehealth (INDEPENDENT_AMBULATORY_CARE_PROVIDER_SITE_OTHER): Payer: Self-pay | Admitting: Internal Medicine

## 2010-11-12 ENCOUNTER — Encounter (INDEPENDENT_AMBULATORY_CARE_PROVIDER_SITE_OTHER): Payer: Self-pay | Admitting: Internal Medicine

## 2010-11-13 NOTE — Progress Notes (Signed)
Summary: Needs Rx for Accucheck Test strips/Changed to OV I for vaginitis  Phone Note Outgoing Call   Summary of Call: Received request from [facility] for Accucheck glucose test strips -- currently has Rx for One Touch, which pharmacy states Medicaid no longer covers.  Please write new Rx for Accucheck strips. Initial call taken by: Dutch Quint RN,  October 23, 2010 3:53 PM  Follow-up for Phone Call        Which type of Accu chek monitor does she have? Follow-up by: Julieanne Manson MD,  October 29, 2010 12:21 PM  Additional Follow-up for Phone Call Additional follow up Details #1::        Left message on answering machine for pt. to return call.  Dutch Quint RN  October 30, 2010 5:33 PM      Additional Follow-up for Phone Call Additional follow up Details #2::    Spoke with pt.'s caregiver.  She states that they do not have the Accu-check meter yet, still the Prodigy.  She needs a new Rx for meter, strips and lancets printed for her, since she's changing pharmacies.  Please have available when she comes in for lab visit on Tuesday.  Dutch Quint RN  October 31, 2010 12:20 PM  Printed--can pick up Tuesday Julieanne Manson MD  October 31, 2010 3:25 PM    Additional Follow-up for Phone Call Additional follow up Details #3:: Details for Additional Follow-up Action Taken: Pt. here today to pick up scripts--apparently complaining of vaginal itching and wanting a prescription for her shampoo.  I do not know what shampoo she is speaking of--none in her med list.  Please clarify.  Julieanne Manson MD  November 04, 2010 10:28 AM   Multiple requests for forms to be filled out--to leave and I will take care of them  Julieanne Manson MD  November 04, 2010 11:19 AM   New/Updated Medications: FLUCONAZOLE 150 MG TABS (FLUCONAZOLE) 1 tab by mouth today ACCU-CHEK AVIVA  KIT (BLOOD GLUCOSE MONITORING SUPPL) 4 times daily blood sugar testing ACCU-CHEK AVIVA  STRP (GLUCOSE BLOOD) 4  times daily blood sugar testing. LANCETS  MISC (LANCETS) 4 times daily blood sugar testing. Prescriptions: FLUCONAZOLE 150 MG TABS (FLUCONAZOLE) 1 tab by mouth today  #1 x 0   Entered and Authorized by:   Julieanne Manson MD   Signed by:   Julieanne Manson MD on 11/04/2010   Method used:   Electronically to        [facility]* (retail)       [address]       [place], [place]  [address]       Ph: [phone]       Fax: [phone]   RxID:   [id number] LANCETS  MISC (LANCETS) 4 times daily blood sugar testing.  #130 x 11   Entered and Authorized by:   Julieanne Manson MD   Signed by:   Julieanne Manson MD on 10/31/2010   Method used:   Print then Give to Patient   RxID:   [id number] ACCU-CHEK AVIVA  STRP (GLUCOSE BLOOD) 4 times daily blood sugar testing.  #130 x 11   Entered and Authorized by:   Julieanne Manson MD   Signed by:   Julieanne Manson MD on 10/31/2010   Method used:   Print then Give to Patient   RxID:   [id number] ACCU-CHEK AVIVA  KIT (BLOOD GLUCOSE MONITORING SUPPL) 4 times daily blood sugar testing  #1 x 0   Entered  and Authorized by:   Julieanne Manson MD   Signed by:   Julieanne Manson MD on 10/31/2010   Method used:   Print then Give to Patient   RxID:   [id number]      Primary Care Provider:  Tereso Newcomer PA-C   History of Present Illness: Pt. here today as extension of phone note. Having vaginal itching without discharge for about 2-3 weeks.  No odor. No recent abx.   Impression & Recommendations:  Problem # 1:  VAGINITIS, CANDIDAL (ICD-112.1)  Her updated medication list for this problem includes:    Fluconazole 150 Mg Tabs (Fluconazole) .Marland Kitchen... 1 tab by mouth today  Orders: KOH/ WET Mount [phone])  Complete Medication List: 1)  Pravastatin Sodium 80 Mg Tabs (Pravastatin sodium) .... Take 1 tab by mouth at bedtime for cholesterol (d/c simvastatin) 2)  Lisinopril 10 Mg Tabs (Lisinopril) .Marland Kitchen.. 1  tablet by mouth daily 3)  Depo-provera 150 Mg/ml Susp (Medroxyprogesterone acetate) .... Im q 3 months 4)  Levemir 100 Unit/ml Soln (Insulin detemir) .... Inject 30 units at bedtime (note dose increase) 5)  Carvedilol 3.125 Mg Tabs (Carvedilol) .... Take 1 tablet by mouth every 12 hours(per psych hosp) 6)  Depakote 500 Mg Tbec (Divalproex sodium) .... Take 1 tablet by mouth two times a day 7)  Zolpidem Tartrate 5 Mg Tabs (Zolpidem tartrate) .... Take 1 tab by mouth at bedtime as needed for sleep 8)  Miralax Powd (Polyethylene glycol 3350) .Marland Kitchen.. 1 capful daily as needed for constipation 9)  Pepcid 20 Mg Tabs (Famotidine) .... Take 1 tablet by mouth two times a day 10)  Prenatal Vitamins 0.8 Mg Tabs (Prenatal multivit-min-fe-fa) .... Take 1 tablet by mouth once a day 11)  Cetirizine Hcl 10 Mg Tabs (Cetirizine hcl) .... Take 1 tablet by mouth once a day for allergies (pharmacy: d/c loratadine) 12)  Flonase 50 Mcg/act Susp (Fluticasone propionate) .... 2 sprays each nostril once daily for allergies 13)  Insulin Syringe/needle 28g X 1/2" 1 Ml Misc (Insulin syringe-needle u-100) .... Use with levemir insulin 14)  Geodon 20 Mg Caps (Ziprasidone hcl) .... Take 2 capsule by mouth at bedtime daily. 15)  Benztropine Mesylate 1 Mg Tabs (Benztropine mesylate) .Marland Kitchen.. 1 tablet by mouth twice daily 16)  Humalog Kwikpen 100 Unit/ml Soln (Insulin lispro (human)) .... 5 units subcutaneously 15 minutes before each meal three times a day 17)  Fluconazole 150 Mg Tabs (Fluconazole) .Marland Kitchen.. 1 tab by mouth today 18)  Humalog Kwikpen Tips/needles  .... Use with kwikpen humalog injections three times a day 19)  Accu-chek Aviva Kit (Blood glucose monitoring suppl) .... 4 times daily blood sugar testing 20)  Accu-chek Aviva Strp (Glucose blood) .... 4 times daily blood sugar testing. 21)  Lancets Misc (Lancets) .... 4 times daily blood sugar testing.   Laboratory Results    Wet Mount/KOH Source: self swab WBC/hpf:  1-5 Bacteria/hpf: 1+ Clue cells/hpf: few  Negative whiff Yeast/hpf: few Trichomonas/hpf: none

## 2010-11-13 NOTE — Assessment & Plan Note (Signed)
  Nurse Visit  next depo is [name] 21, 2011  Vitals Entered By: [place] Shannon (November 04, 2010 12:21 PM)  Allergies: 1)  ! Haldol  Medication Administration  Injection # 1:    Medication: Depo-Provera 150mg     Diagnosis: CONTRACEPTIVE MANAGEMENT (ICD-V25.09)    Route: IM    Site: L deltoid    Exp Date: 07/2013    Lot #: [id number]    Mfr: [email or web address]    Comments: next depo is [name] 21, 2011    Patient tolerated injection without complications    Given by: Armenia Shannon (November 04, 2010 12:23 PM)  Orders Added: 1)  Est. Patient Level I [95621] 2)  Admin of Therapeutic Inj  intramuscular or subcutaneous [96372]   Medication Administration  Injection # 1:    Medication: Depo-Provera 150mg     Diagnosis: CONTRACEPTIVE MANAGEMENT (ICD-V25.09)    Route: IM    Site: L deltoid    Exp Date: 07/2013    Lot #: [id number]    Mfr: greenstone    Comments: next depo is [name] 21, 2011    Patient tolerated injection without complications    Given by: Armenia Shannon (November 04, 2010 12:23 PM)  Orders Added: 1)  Est. Patient Level I [78469] 2)  Admin of Therapeutic Inj  intramuscular or subcutaneous [62952]

## 2010-11-13 NOTE — Medication Information (Signed)
Summary: DOCTOR ORDER TESTING SUPPLIES  DOCTOR ORDER TESTING SUPPLIES   Imported By: Arta Bruce 11/07/2010 15:03:44  _____________________________________________________________________  External Attachment:    Type:   Image     Comment:   External Document

## 2010-11-18 NOTE — Letter (Signed)
Summary: DOCTOR'S ORDER TESTING SUPPLIES  DOCTOR'S ORDER TESTING SUPPLIES   Imported By: Arta Bruce 11/12/2010 11:21:40  _____________________________________________________________________  External Attachment:    Type:   Image     Comment:   External Document

## 2010-11-20 ENCOUNTER — Telehealth (INDEPENDENT_AMBULATORY_CARE_PROVIDER_SITE_OTHER): Payer: Self-pay | Admitting: Internal Medicine

## 2010-11-20 LAB — BASIC METABOLIC PANEL WITH GFR
BUN: 9 mg/dL (ref 6–23)
CO2: 25 meq/L (ref 19–32)
Calcium: 9.5 mg/dL (ref 8.4–10.5)
Chloride: 105 meq/L (ref 96–112)
Creatinine, Ser: 0.68 mg/dL (ref 0.4–1.2)
GFR calc non Af Amer: >60 mL/min
Glucose, Bld: 340 mg/dL — ABNORMAL HIGH (ref 70–99)
Potassium: 4.3 meq/L (ref 3.5–5.1)
Sodium: 139 meq/L (ref 135–145)

## 2010-11-20 LAB — DIFFERENTIAL
Basophils Absolute: 0.1 K/uL (ref 0.0–0.1)
Basophils Relative: 1 % (ref 0–1)
Eosinophils Absolute: 0.1 K/uL (ref 0.0–0.7)
Eosinophils Relative: 1 % (ref 0–5)
Lymphocytes Relative: 29 % (ref 12–46)
Lymphs Abs: 3 K/uL (ref 0.7–4.0)
Monocytes Absolute: 0.6 K/uL (ref 0.1–1.0)
Monocytes Relative: 6 % (ref 3–12)
Neutro Abs: 6.6 K/uL (ref 1.7–7.7)
Neutrophils Relative %: 64 % (ref 43–77)

## 2010-11-20 LAB — TRICYCLICS SCREEN, URINE: TCA Scrn: NOT DETECTED

## 2010-11-20 LAB — GLUCOSE, CAPILLARY
Glucose-Capillary: 220 mg/dL — ABNORMAL HIGH (ref 70–99)
Glucose-Capillary: 311 mg/dL — ABNORMAL HIGH (ref 70–99)
Glucose-Capillary: 313 mg/dL — ABNORMAL HIGH (ref 70–99)
Glucose-Capillary: 327 mg/dL — ABNORMAL HIGH (ref 70–99)
Glucose-Capillary: 357 mg/dL — ABNORMAL HIGH (ref 70–99)
Glucose-Capillary: 367 mg/dL — ABNORMAL HIGH (ref 70–99)
Glucose-Capillary: 445 mg/dL — ABNORMAL HIGH (ref 70–99)

## 2010-11-20 LAB — URINALYSIS, ROUTINE W REFLEX MICROSCOPIC
Glucose, UA: NEGATIVE mg/dL
Ketones, ur: NEGATIVE mg/dL
Protein, ur: NEGATIVE mg/dL

## 2010-11-20 LAB — CBC
HCT: 39.3 % (ref 36.0–46.0)
Hemoglobin: 13.4 g/dL (ref 12.0–15.0)
WBC: 10.4 10*3/uL (ref 4.0–10.5)

## 2010-11-20 LAB — RAPID URINE DRUG SCREEN, HOSP PERFORMED
Cocaine: NOT DETECTED
Tetrahydrocannabinol: NOT DETECTED

## 2010-11-20 LAB — URINE MICROSCOPIC-ADD ON

## 2010-11-22 LAB — GLUCOSE, CAPILLARY
Glucose-Capillary: 167 mg/dL — ABNORMAL HIGH (ref 70–99)
Glucose-Capillary: 220 mg/dL — ABNORMAL HIGH (ref 70–99)
Glucose-Capillary: 265 mg/dL — ABNORMAL HIGH (ref 70–99)

## 2010-11-22 LAB — CBC
HCT: 36.8 % (ref 36.0–46.0)
Hemoglobin: 12.5 g/dL (ref 12.0–15.0)
Hemoglobin: 14 g/dL (ref 12.0–15.0)
MCH: 29.3 pg (ref 26.0–34.0)
MCHC: 33.4 g/dL (ref 30.0–36.0)
MCV: 87.2 fL (ref 78.0–100.0)
Platelets: 193 10*3/uL (ref 150–400)
RBC: 4.78 MIL/uL (ref 3.87–5.11)
RDW: 14.4 % (ref 11.5–15.5)
WBC: 14.9 10*3/uL — ABNORMAL HIGH (ref 4.0–10.5)

## 2010-11-22 LAB — DIFFERENTIAL
Basophils Absolute: 0.1 10*3/uL (ref 0.0–0.1)
Basophils Absolute: 0.1 10*3/uL (ref 0.0–0.1)
Eosinophils Relative: 1 % (ref 0–5)
Eosinophils Relative: 0 % (ref 0–5)
Lymphocytes Relative: 35 % (ref 12–46)
Lymphocytes Relative: 20 % (ref 12–46)
Lymphs Abs: 2.9 10*3/uL (ref 0.7–4.0)
Monocytes Absolute: 0.8 10*3/uL (ref 0.1–1.0)
Monocytes Absolute: 0.6 10*3/uL (ref 0.1–1.0)
Monocytes Relative: 5 % (ref 3–12)
Monocytes Relative: 4 % (ref 3–12)
Neutro Abs: 10.5 10*3/uL — ABNORMAL HIGH (ref 1.7–7.7)

## 2010-11-22 LAB — COMPREHENSIVE METABOLIC PANEL
ALT: 14 U/L (ref 0–35)
AST: 18 U/L (ref 0–37)
Albumin: 3.5 g/dL (ref 3.5–5.2)
CO2: 25 mEq/L (ref 19–32)
Calcium: 9.9 mg/dL (ref 8.4–10.5)
Creatinine, Ser: 0.64 mg/dL (ref 0.4–1.2)
GFR calc Af Amer: >60 mL/min (ref >60)
GFR calc non Af Amer: >60 mL/min (ref >60)
Sodium: 142 mEq/L (ref 135–145)
Total Protein: 7.7 g/dL (ref 6.0–8.3)

## 2010-11-22 LAB — BASIC METABOLIC PANEL
BUN: 13 mg/dL (ref 6–23)
Chloride: 107 mEq/L (ref 96–112)
Glucose, Bld: 148 mg/dL — ABNORMAL HIGH (ref 70–99)
Potassium: 3.9 mEq/L (ref 3.5–5.1)
Sodium: 140 mEq/L (ref 135–145)

## 2010-11-22 LAB — URINE MICROSCOPIC-ADD ON

## 2010-11-22 LAB — URINALYSIS, ROUTINE W REFLEX MICROSCOPIC
Glucose, UA: NEGATIVE mg/dL
Hgb urine dipstick: NEGATIVE
Protein, ur: NEGATIVE mg/dL
Specific Gravity, Urine: 1.006 (ref 1.005–1.030)
pH: 6.5 (ref 5.0–8.0)

## 2010-11-22 LAB — RAPID URINE DRUG SCREEN, HOSP PERFORMED
Cocaine: NOT DETECTED
Opiates: NOT DETECTED

## 2010-11-22 LAB — ETHANOL: Alcohol, Ethyl (B): <5 mg/dL (ref 0–10)

## 2010-11-23 LAB — DIFFERENTIAL
Basophils Absolute: 0.1 10*3/uL (ref 0.0–0.1)
Basophils Absolute: 0.1 10*3/uL (ref 0.0–0.1)
Basophils Relative: 1 % (ref 0–1)
Basophils Relative: 1 % (ref 0–1)
Eosinophils Absolute: 0.1 10*3/uL (ref 0.0–0.7)
Eosinophils Absolute: 0 10*3/uL (ref 0.0–0.7)
Eosinophils Absolute: 0 10*3/uL (ref 0.0–0.7)
Eosinophils Relative: 0 % (ref 0–5)
Eosinophils Relative: 0 % (ref 0–5)
Lymphocytes Relative: 22 % (ref 12–46)
Lymphs Abs: 4.5 10*3/uL — ABNORMAL HIGH (ref 0.7–4.0)
Lymphs Abs: 2.6 10*3/uL (ref 0.7–4.0)
Lymphs Abs: 2.6 10*3/uL (ref 0.7–4.0)
Monocytes Absolute: 0.6 10*3/uL (ref 0.1–1.0)
Monocytes Absolute: 0.6 10*3/uL (ref 0.1–1.0)
Monocytes Absolute: 0.6 10*3/uL (ref 0.1–1.0)
Monocytes Relative: 5 % (ref 3–12)
Neutro Abs: 8.8 10*3/uL — ABNORMAL HIGH (ref 1.7–7.7)
Neutro Abs: 6.7 10*3/uL (ref 1.7–7.7)
Neutrophils Relative %: 62 % (ref 43–77)

## 2010-11-23 LAB — URINE MICROSCOPIC-ADD ON

## 2010-11-23 LAB — URINALYSIS, ROUTINE W REFLEX MICROSCOPIC
Bilirubin Urine: NEGATIVE
Glucose, UA: NEGATIVE mg/dL
Hgb urine dipstick: NEGATIVE
Ketones, ur: NEGATIVE mg/dL
Ketones, ur: NEGATIVE mg/dL
Leukocytes, UA: NEGATIVE
Nitrite: NEGATIVE
Nitrite: NEGATIVE
Protein, ur: NEGATIVE mg/dL
Protein, ur: NEGATIVE mg/dL
Specific Gravity, Urine: 1.005 (ref 1.005–1.030)
Specific Gravity, Urine: 1.017 (ref 1.005–1.030)
Urobilinogen, UA: 0.2 mg/dL (ref 0.0–1.0)
Urobilinogen, UA: 2 mg/dL — ABNORMAL HIGH (ref 0.0–1.0)
pH: 7.5 (ref 5.0–8.0)
pH: 6.5 (ref 5.0–8.0)

## 2010-11-23 LAB — GLUCOSE, CAPILLARY
Glucose-Capillary: 173 mg/dL — ABNORMAL HIGH (ref 70–99)
Glucose-Capillary: 101 mg/dL — ABNORMAL HIGH (ref 70–99)
Glucose-Capillary: 128 mg/dL — ABNORMAL HIGH (ref 70–99)
Glucose-Capillary: 144 mg/dL — ABNORMAL HIGH (ref 70–99)
Glucose-Capillary: 155 mg/dL — ABNORMAL HIGH (ref 70–99)
Glucose-Capillary: 52 mg/dL — ABNORMAL LOW (ref 70–99)
Glucose-Capillary: 59 mg/dL — ABNORMAL LOW (ref 70–99)
Glucose-Capillary: 70 mg/dL (ref 70–99)
Glucose-Capillary: 74 mg/dL (ref 70–99)
Glucose-Capillary: 87 mg/dL (ref 70–99)
Glucose-Capillary: 88 mg/dL (ref 70–99)
Glucose-Capillary: 88 mg/dL (ref 70–99)
Glucose-Capillary: 88 mg/dL (ref 70–99)
Glucose-Capillary: 91 mg/dL (ref 70–99)
Glucose-Capillary: 146 mg/dL — ABNORMAL HIGH (ref 70–99)
Glucose-Capillary: 155 mg/dL — ABNORMAL HIGH (ref 70–99)
Glucose-Capillary: 226 mg/dL — ABNORMAL HIGH (ref 70–99)
Glucose-Capillary: 258 mg/dL — ABNORMAL HIGH (ref 70–99)
Glucose-Capillary: 295 mg/dL — ABNORMAL HIGH (ref 70–99)
Glucose-Capillary: 317 mg/dL — ABNORMAL HIGH (ref 70–99)
Glucose-Capillary: 335 mg/dL — ABNORMAL HIGH (ref 70–99)

## 2010-11-23 LAB — CBC
HCT: 38.3 % (ref 36.0–46.0)
HCT: 39 % (ref 36.0–46.0)
Hemoglobin: 13.2 g/dL (ref 12.0–15.0)
Hemoglobin: 12.8 g/dL (ref 12.0–15.0)
MCH: 29.7 pg (ref 26.0–34.0)
MCHC: 33.8 g/dL (ref 30.0–36.0)
MCV: 87.3 fL (ref 78.0–100.0)
MCV: 89.8 fL (ref 78.0–100.0)
MCV: 90.4 fL (ref 78.0–100.0)
Platelets: 161 10*3/uL (ref 150–400)
Platelets: 167 10*3/uL (ref 150–400)
Platelets: 180 10*3/uL (ref 150–400)
Platelets: 189 10*3/uL (ref 150–400)
RBC: 4.39 MIL/uL (ref 3.87–5.11)
RBC: 4.38 MIL/uL (ref 3.87–5.11)
WBC: 9.6 10*3/uL (ref 4.0–10.5)
WBC: 11.6 10*3/uL — ABNORMAL HIGH (ref 4.0–10.5)
WBC: 11.6 10*3/uL — ABNORMAL HIGH (ref 4.0–10.5)

## 2010-11-23 LAB — BASIC METABOLIC PANEL
BUN: 8 mg/dL (ref 6–23)
Chloride: 107 mEq/L (ref 96–112)
Potassium: 4 mEq/L (ref 3.5–5.1)

## 2010-11-23 LAB — POCT I-STAT, CHEM 8
BUN: 8 mg/dL (ref 6–23)
Calcium, Ion: 1.13 mmol/L (ref 1.12–1.32)
Chloride: 102 mEq/L (ref 96–112)
Creatinine, Ser: 0.5 mg/dL (ref 0.4–1.2)
Glucose, Bld: 123 mg/dL — ABNORMAL HIGH (ref 70–99)
HCT: 42 % (ref 36.0–46.0)
Hemoglobin: 14.3 g/dL (ref 12.0–15.0)
Potassium: 3.8 mEq/L (ref 3.5–5.1)
Sodium: 135 mEq/L (ref 135–145)
TCO2: 22 mmol/L (ref 0–100)

## 2010-11-23 LAB — COMPREHENSIVE METABOLIC PANEL
ALT: 16 U/L (ref 0–35)
AST: 15 U/L (ref 0–37)
Albumin: 3.4 g/dL — ABNORMAL LOW (ref 3.5–5.2)
Albumin: 3.4 g/dL — ABNORMAL LOW (ref 3.5–5.2)
Alkaline Phosphatase: 46 U/L (ref 39–117)
BUN: 6 mg/dL (ref 6–23)
CO2: 24 mEq/L (ref 19–32)
Chloride: 105 mEq/L (ref 96–112)
Chloride: 108 mEq/L (ref 96–112)
Creatinine, Ser: 0.54 mg/dL (ref 0.4–1.2)
GFR calc Af Amer: >60 mL/min (ref >60)
GFR calc non Af Amer: >60 mL/min (ref >60)
Glucose, Bld: 243 mg/dL — ABNORMAL HIGH (ref 70–99)
Potassium: 4.4 mEq/L (ref 3.5–5.1)
Potassium: 4.1 mEq/L (ref 3.5–5.1)
Sodium: 140 mEq/L (ref 135–145)
Total Bilirubin: 0.4 mg/dL (ref 0.3–1.2)
Total Bilirubin: 0.4 mg/dL (ref 0.3–1.2)

## 2010-11-23 LAB — RAPID URINE DRUG SCREEN, HOSP PERFORMED
Amphetamines: NOT DETECTED
Barbiturates: NOT DETECTED
Cocaine: NOT DETECTED
Opiates: NOT DETECTED
Tetrahydrocannabinol: NOT DETECTED
Tetrahydrocannabinol: NOT DETECTED

## 2010-11-23 LAB — ETHANOL
Alcohol, Ethyl (B): <5 mg/dL (ref 0–10)
Alcohol, Ethyl (B): <5 mg/dL (ref 0–10)

## 2010-11-23 LAB — POCT PREGNANCY, URINE: Preg Test, Ur: NEGATIVE

## 2010-11-23 LAB — VALPROIC ACID LEVEL
Valproic Acid Lvl: 65.4 ug/mL (ref 50.0–100.0)
Valproic Acid Lvl: 31.3 ug/mL — ABNORMAL LOW (ref 50.0–100.0)
Valproic Acid Lvl: 88.2 ug/mL (ref 50.0–100.0)

## 2010-11-26 ENCOUNTER — Encounter (INDEPENDENT_AMBULATORY_CARE_PROVIDER_SITE_OTHER): Payer: Self-pay | Admitting: Internal Medicine

## 2010-11-27 ENCOUNTER — Encounter (INDEPENDENT_AMBULATORY_CARE_PROVIDER_SITE_OTHER): Payer: Self-pay | Admitting: Internal Medicine

## 2010-11-27 ENCOUNTER — Encounter: Payer: Self-pay | Admitting: Internal Medicine

## 2010-11-27 DIAGNOSIS — H9209 Otalgia, unspecified ear: Secondary | ICD-10-CM | POA: Insufficient documentation

## 2010-11-27 LAB — CONVERTED CEMR LAB: Blood Glucose, Fingerstick: 158

## 2010-12-04 NOTE — Letter (Signed)
Summary: HEALTHCARE APPOINTMENT SUMMARY  HEALTHCARE APPOINTMENT SUMMARY   Imported By: Arta Bruce 11/27/2010 14:08:17  _____________________________________________________________________  External Attachment:    Type:   Image     Comment:   External Document

## 2010-12-04 NOTE — Assessment & Plan Note (Signed)
Summary: 4 month f/u on DM   Vital Signs:  Patient profile:   30 year old female Menstrual status:  depo Weight:      185.13 pounds Temp:     98.5 degrees F oral Pulse rate:   77 / minute Pulse rhythm:   regular Resp:     18 per minute BP sitting:   125 / 77  (left arm) Cuff size:   regular  Vitals Entered By: Hale Drone CMA (November 27, 2010 10:31 AM) CC: 4 month f/u on DM Is Patient Diabetic? Yes Pain Assessment Patient in pain? no      CBG Result 158 CBG Device ID M non fasting  Does patient need assistance? Functional Status Self care Ambulation Normal   Primary Care Provider:  Tereso Newcomer PA-C  CC:  4 month f/u on DM.  History of Present Illness: 1.  DM:  Has been doing Zumba and walking.   Has been switched to 1800 cal diet. Has lost 3 + lbs.   A1C is down to 6.7% today.  No problems with Humalog.  Pt doing great.  2.  Dysuria:  Resolved with treatment of yeast vaginitis.  3.  Hypercholesterolemia:  has not had checked since 11/2009.  Is taking Pravastatin since August.  Has not been rechecked since.  Nonfasting today.  4.  Headaches and right ear pain:  is taking allergy meds.  Discussed avoidance outside if heavy pollen count.    Current Medications (verified): 1)  Pravastatin Sodium 80 Mg Tabs (Pravastatin Sodium) .... Take 1 Tab By Mouth At Bedtime For Cholesterol (D/c Simvastatin) 2)  Lisinopril 10 Mg Tabs (Lisinopril) .Marland Kitchen.. 1 Tablet By Mouth Daily 3)  Depo-Provera 150 Mg/ml Susp (Medroxyprogesterone Acetate) .... Im Q 3 Months 4)  Levemir 100 Unit/ml Soln (Insulin Detemir) .... Inject 30 Units At Bedtime (Note Dose Increase) 5)  Carvedilol 3.125 Mg Tabs (Carvedilol) .... Take 1 Tablet By Mouth Every 12 Hours(Per Psych Hosp) 6)  Depakote 500 Mg Tbec (Divalproex Sodium) .... Take 1 Tablet By Mouth Two Times A Day 7)  Zolpidem Tartrate 5 Mg Tabs (Zolpidem Tartrate) .... Take 1 Tab By Mouth At Bedtime As Needed For Sleep 8)  Miralax  Powd (Polyethylene Glycol  3350) .Marland Kitchen.. 1 Capful Daily As Needed For Constipation 9)  Pepcid 20 Mg Tabs (Famotidine) .... Take 1 Tablet By Mouth Two Times A Day 10)  Prenatal Vitamins 0.8 Mg Tabs (Prenatal Multivit-Min-Fe-Fa) .... Take 1 Tablet By Mouth Once A Day 11)  Cetirizine Hcl 10 Mg Tabs (Cetirizine Hcl) .... Take 1 Tablet By Mouth Once A Day For Allergies (Pharmacy: D/c Loratadine) 12)  Flonase 50 Mcg/act Susp (Fluticasone Propionate) .... 2 Sprays Each Nostril Once Daily For Allergies 13)  Insulin Syringe/needle 28g X 1/2" 1 Ml Misc (Insulin Syringe-Needle U-100) .... Use With Levemir Insulin 14)  Geodon 20 Mg Caps (Ziprasidone Hcl) .... Take 2 Capsule By Mouth At Bedtime Daily. 15)  Benztropine Mesylate 1 Mg Tabs (Benztropine Mesylate) .Marland Kitchen.. 1 Tablet By Mouth Twice Daily 16)  Humalog Kwikpen 100 Unit/ml Soln (Insulin Lispro (Human)) .... 5 Units Subcutaneously 15 Minutes Before Each Meal Three Times A Day 17)  Fluconazole 150 Mg Tabs (Fluconazole) .Marland Kitchen.. 1 Tab By Mouth Today 18)  Humalog Kwikpen Tips/needles .... Use With Kwikpen Humalog Injections Three Times A Day 19)  Accu-Chek Aviva  Kit (Blood Glucose Monitoring Suppl) .... 4 Times Daily Blood Sugar Testing 20)  Accu-Chek Aviva  Strp (Glucose Blood) .... 4 Times Daily Blood  Sugar Testing. 21)  Lancets  Misc (Lancets) .... 4 Times Daily Blood Sugar Testing.  Allergies (verified): 1)  ! Haldol  Physical Exam  General:  overweight. NAD Head:  Tender over frontal sinuses Ears:  TMs pearly gray Nose:  mucosal congestion Mouth:  pharynx pink and moist.   Lungs:  Normal respiratory effort, chest expands symmetrically. Lungs are clear to auscultation, no crackles or wheezes. Heart:  Normal rate and regular rhythm. S1 and S2 normal without gallop, murmur, click, rub or other extra sounds.   Impression & Recommendations:  Problem # 1:  VAGINITIS, CANDIDAL (ICD-112.1) Resolved Her updated medication list for this problem includes:    Fluconazole 150 Mg Tabs  (Fluconazole) .Marland Kitchen... 1 tab by mouth today  Problem # 2:  HYPERLIPIDEMIA (ICD-272.4) to return for FLP Her updated medication list for this problem includes:    Pravastatin Sodium 80 Mg Tabs (Pravastatin sodium) .Marland Kitchen... Take 1 tab by mouth at bedtime for cholesterol (d/c simvastatin)  Problem # 3:  DIABETES MELLITUS, TYPE II (ICD-250.00) Great control--to call if sugars dropping too low and will back off on insulin. Her updated medication list for this problem includes:    Lisinopril 10 Mg Tabs (Lisinopril) .Marland Kitchen... 1 tablet by mouth daily    Levemir 100 Unit/ml Soln (Insulin detemir) ..... Inject 30 units at bedtime (note dose increase)    Humalog Kwikpen 100 Unit/ml Soln (Insulin lispro (human)) .Marland KitchenMarland KitchenMarland KitchenMarland Kitchen 5 units subcutaneously 15 minutes before each meal three times a day  Orders: Capillary Blood Glucose/CBG ([address])  Problem # 4:  EAR PAIN, RIGHT (ICD-388.70) Suspect this and headache related to allergies with significant pollen count currently Avoidance during peak pollen days and to continue allergy meds.  Problem # 5:  HYPERTENSION (ICD-401.9) Controlled Her updated medication list for this problem includes:    Lisinopril 10 Mg Tabs (Lisinopril) .Marland Kitchen... 1 tablet by mouth daily    Carvedilol 3.125 Mg Tabs (Carvedilol) .Marland Kitchen... Take 1 tablet by mouth every 12 hours(per psych hosp)  Complete Medication List: 1)  Pravastatin Sodium 80 Mg Tabs (Pravastatin sodium) .... Take 1 tab by mouth at bedtime for cholesterol (d/c simvastatin) 2)  Lisinopril 10 Mg Tabs (Lisinopril) .Marland Kitchen.. 1 tablet by mouth daily 3)  Depo-provera 150 Mg/ml Susp (Medroxyprogesterone acetate) .... Im q 3 months 4)  Levemir 100 Unit/ml Soln (Insulin detemir) .... Inject 30 units at bedtime (note dose increase) 5)  Carvedilol 3.125 Mg Tabs (Carvedilol) .... Take 1 tablet by mouth every 12 hours(per psych hosp) 6)  Depakote 500 Mg Tbec (Divalproex sodium) .... Take 1 tablet by mouth two times a day 7)  Zolpidem Tartrate 5 Mg Tabs  (Zolpidem tartrate) .... Take 1 tab by mouth at bedtime as needed for sleep 8)  Miralax Powd (Polyethylene glycol 3350) .Marland Kitchen.. 1 capful daily as needed for constipation 9)  Pepcid 20 Mg Tabs (Famotidine) .... Take 1 tablet by mouth two times a day 10)  Prenatal Vitamins 0.8 Mg Tabs (Prenatal multivit-min-fe-fa) .... Take 1 tablet by mouth once a day 11)  Cetirizine Hcl 10 Mg Tabs (Cetirizine hcl) .... Take 1 tablet by mouth once a day for allergies (pharmacy: d/c loratadine) 12)  Flonase 50 Mcg/act Susp (Fluticasone propionate) .... 2 sprays each nostril once daily for allergies 13)  Insulin Syringe/needle 28g X 1/2" 1 Ml Misc (Insulin syringe-needle u-100) .... Use with levemir insulin 14)  Geodon 20 Mg Caps (Ziprasidone hcl) .... Take 2 capsule by mouth at bedtime daily. 15)  Benztropine Mesylate 1 Mg Tabs (  Benztropine mesylate) .Marland Kitchen.. 1 tablet by mouth twice daily 16)  Humalog Kwikpen 100 Unit/ml Soln (Insulin lispro (human)) .... 5 units subcutaneously 15 minutes before each meal three times a day 17)  Fluconazole 150 Mg Tabs (Fluconazole) .Marland Kitchen.. 1 tab by mouth today 18)  Humalog Kwikpen Tips/needles  .... Use with kwikpen humalog injections three times a day 19)  Accu-chek Aviva Kit (Blood glucose monitoring suppl) .... 4 times daily blood sugar testing 20)  Accu-chek Aviva Strp (Glucose blood) .... 4 times daily blood sugar testing. 21)  Lancets Misc (Lancets) .... 4 times daily blood sugar testing.  Patient Instructions: 1)  CPP with Dr. Delrae Alfred in July 2)  Fasting lab visit for FLP and CMET in next month please. Prescriptions: LANCETS  MISC (LANCETS) 4 times daily blood sugar testing.  #130 x 11   Entered and Authorized by:   Julieanne Manson MD   Signed by:   Julieanne Manson MD on 11/27/2010   Method used:   Electronically to        [facility]* (retail)       [address]       [place], [place]  [address]       Ph: [phone]       Fax: [phone]   RxID:    [id number] ACCU-CHEK AVIVA  STRP (GLUCOSE BLOOD) 4 times daily blood sugar testing.  #130 x 11   Entered and Authorized by:   Julieanne Manson MD   Signed by:   Julieanne Manson MD on 11/27/2010   Method used:   Electronically to        [facility]* (retail)       [address]       [place], [place]  [address]       Ph: [phone]       Fax: [phone]   RxID:   [id number] Catalina Lunger TIPS/NEEDLES Use with Stephanie Coup Humalog injections three times a day  #100 x 11   Entered and Authorized by:   Julieanne Manson MD   Signed by:   Julieanne Manson MD on 11/27/2010   Method used:   Print then Give to Patient   RxID:   [id number] HUMALOG KWIKPEN 100 UNIT/ML SOLN (INSULIN LISPRO (HUMAN)) 5 units subcutaneously 15 minutes before each meal three times a day  #1 month x 11   Entered and Authorized by:   Julieanne Manson MD   Signed by:   Julieanne Manson MD on 11/27/2010   Method used:   Electronically to        [facility]* (retail)       [address]       [place], [place]  [address]       Ph: [phone]       Fax: [phone]   RxID:   [id number] INSULIN SYRINGE/NEEDLE 28G X 1/2" 1 ML MISC (INSULIN SYRINGE-NEEDLE U-100) Use with levemir insulin  #100 x 11   Entered and Authorized by:   Julieanne Manson MD   Signed by:   Julieanne Manson MD on 11/27/2010   Method used:   Electronically to        [facility]* (retail)       [address]       [place], [place]  [address]       Ph: [phone]       Fax: [phone]   RxID:   [id number] PRENATAL VITAMINS 0.8 MG TABS (PRENATAL MULTIVIT-MIN-FE-FA) Take 1 tablet  by mouth once a day  #30 x 11   Entered and Authorized by:   Julieanne Manson MD   Signed by:   Julieanne Manson MD on 11/27/2010   Method used:   Electronically to        [facility]* (retail)       [address]       [place], [place]  [address]       Ph: [phone]       Fax:  [phone]   RxID:   [id number] FLONASE 50 MCG/ACT SUSP (FLUTICASONE PROPIONATE) 2 sprays each nostril once daily for allergies  #1 x 11   Entered and Authorized by:   Julieanne Manson MD   Signed by:   Julieanne Manson MD on 11/27/2010   Method used:   Electronically to        [facility]* (retail)       [address]       [place], [place]  [address]       Ph: [phone]       Fax: [phone]   RxID:   [id number] CETIRIZINE HCL 10 MG TABS (CETIRIZINE HCL) Take 1 tablet by mouth once a day for allergies (pharmacy: d/c loratadine)  #30 x 11   Entered and Authorized by:   Julieanne Manson MD   Signed by:   Julieanne Manson MD on 11/27/2010   Method used:   Electronically to        [facility]* (retail)       [address]       [place], [place]  [address]       Ph: [phone]       Fax: [phone]   RxID:   [id number] PEPCID 20 MG TABS (FAMOTIDINE) Take 1 tablet by mouth two times a day  #60 x 11   Entered and Authorized by:   Julieanne Manson MD   Signed by:   Julieanne Manson MD on 11/27/2010   Method used:   Electronically to        [facility]* (retail)       [address]       [place], [place]  [address]       Ph: [phone]       Fax: [phone]   RxID:   [id number] CARVEDILOL 3.125 MG TABS (CARVEDILOL) Take 1 tablet by mouth every 12 hours(per Psych hosp)  #60 x 11   Entered and Authorized by:   Julieanne Manson MD   Signed by:   Julieanne Manson MD on 11/27/2010   Method used:   Electronically to        [facility]* (retail)       [address]       [place], [place]  [address]       Ph: [phone]       Fax: [phone]   RxID:   [id number] LEVEMIR 100 UNIT/ML SOLN (INSULIN DETEMIR) Inject 30 units at bedtime (note dose increase)  #1 mo. supply x 11   Entered and Authorized by:   Julieanne Manson MD   Signed by:   Julieanne Manson MD on 11/27/2010   Method used:    Electronically to        [facility]* (retail)       [address]       [place], [place]  [address]       Ph: [phone]  Fax: [phone]   RxID:   [id number] DEPO-PROVERA 150 MG/ML SUSP (MEDROXYPROGESTERONE ACETATE) im q 3 months  #1 x 3   Entered and Authorized by:   Julieanne Manson MD   Signed by:   Julieanne Manson MD on 11/27/2010   Method used:   Electronically to        [facility]* (retail)       [address]       [place], [place]  [address]       Ph: [phone]       Fax: [phone]   RxID:   [id number] LISINOPRIL 10 MG TABS (LISINOPRIL) 1 tablet by mouth daily  #30 x 11   Entered and Authorized by:   Julieanne Manson MD   Signed by:   Julieanne Manson MD on 11/27/2010   Method used:   Electronically to        [facility]* (retail)       [address]       [place], [place]  [address]       Ph: [phone]       Fax: [phone]   RxID:   [id number] PRAVASTATIN SODIUM 80 MG TABS (PRAVASTATIN SODIUM) Take 1 tab by mouth at bedtime for cholesterol (d/c simvastatin)  #30 x 11   Entered and Authorized by:   Julieanne Manson MD   Signed by:   Julieanne Manson MD on 11/27/2010   Method used:   Electronically to        [facility]* (retail)       [address]       [place], [place]  [address]       Ph: [phone]       Fax: [phone]   RxID:   [id number]    Orders Added: 1)  Capillary Blood Glucose/CBG [82948] 2)  Est. Patient Level III [[address]]

## 2010-12-04 NOTE — Letter (Signed)
Summary: FL2//FILLED OUT GAVE TO [facility] AFTER SCANNING  FL2//FILLED OUT GAVE TO [facility] AFTER SCANNING   Imported By: Arta Bruce 11/26/2010 10:17:50  _____________________________________________________________________  External Attachment:    Type:   Image     Comment:   External Document

## 2010-12-04 NOTE — Progress Notes (Signed)
Summary: RX new Pharmacy, old [facility] sent  Phone Note Call from Patient Call back at Home Phone [phone]   Summary of Call: Asked on 28 to send new orders/prescriptions  due to changing pharmacies- [facility] phone [phone]  [phone].  Pat had sent old copy of FL2 form for dr. comparison/completion. Initial call taken by: Ernestine Mcmurray,  November 20, 2010 9:15 AM  Follow-up for Phone Call        Gerilyn Nestle -- looks like you've already been working on this.  Dutch Quint RN  November 20, 2010 2:10 PM  FL-2 that was faxed to [facility] was to be picked up and given to caregiver for her records. Has appt. 11/27/10.  I have the form on my desk -- Bard Herbert will ask for me at appt. tomorrow.  [facility] notified to call for transfer of meds from [facility] to them, will notify us what meds need refills approved.  [name] states pt. out of cetirizine -- refill sent to [facility].     Follow-up by: Dutch Quint RN,  November 26, 2010 10:31 AM    Prescriptions: CETIRIZINE HCL 10 MG TABS (CETIRIZINE HCL) Take 1 tablet by mouth once a day for allergies (pharmacy: d/c loratadine)  #30 x 3   Entered by:   Dutch Quint RN   Authorized by:   Julieanne Manson MD   Signed by:   Dutch Quint RN on 11/26/2010   Method used:   Electronically to        [facility]* (retail)       [address]       [place], [place]  [address]       Ph: [phone]       Fax: [phone]   RxID:   [id number]

## 2010-12-04 NOTE — Progress Notes (Signed)
Summary: FL2 Form--questions  Phone Note [employer] of Call: Ramon--can you call [facility] and ask if they have a previously filled out FL2 form for Ms. Leone as I cannot find one scanned in her chart and do not know her pt. information, special care factors--if they do, can they fax to me today so I can finish the FL2 form that was brought in earlier this week? If they do not, then I need the mother, Karie Schwalbe to answer her Patient information questions. Initial call taken by: Julieanne Manson MD,  November 07, 2010 8:58 AM  Follow-up for Phone Call        LM to "[facility]" @ [phone] ... Hale Drone CMA  November 13, 2010 5:04 PM   Please keep until we get a response--if nothing in next couple of days, please call her mother and inquire as above.  Julieanne Manson MD  November 17, 2010 8:37 AM   Sorry... I don't mean to put them back on you... But I was able to talk to Cindi Carbon (Clinical Directory @ [facility]) and he said he would be able to fax over an FL2 that the pt. had filled out before... Placed forms back on your shelf... Hale Drone CMA  November 17, 2010 12:05 PM   Additional Follow-up for Phone Call Additional follow up Details #1::        Done--to be faxed.

## 2010-12-08 LAB — URINALYSIS, ROUTINE W REFLEX MICROSCOPIC
Bilirubin Urine: NEGATIVE
Bilirubin Urine: NEGATIVE
Glucose, UA: NEGATIVE mg/dL
Glucose, UA: NEGATIVE mg/dL
Hgb urine dipstick: NEGATIVE
Ketones, ur: NEGATIVE mg/dL
Ketones, ur: NEGATIVE mg/dL
Nitrite: NEGATIVE
Protein, ur: NEGATIVE mg/dL
Protein, ur: NEGATIVE mg/dL
Specific Gravity, Urine: 1.022 (ref 1.005–1.030)
Urobilinogen, UA: 0.2 mg/dL (ref 0.0–1.0)
pH: 7 (ref 5.0–8.0)
pH: 6.5 (ref 5.0–8.0)

## 2010-12-08 LAB — GLUCOSE, CAPILLARY
Glucose-Capillary: 140 mg/dL — ABNORMAL HIGH (ref 70–99)
Glucose-Capillary: 95 mg/dL (ref 70–99)
Glucose-Capillary: 102 mg/dL — ABNORMAL HIGH (ref 70–99)
Glucose-Capillary: 104 mg/dL — ABNORMAL HIGH (ref 70–99)
Glucose-Capillary: 104 mg/dL — ABNORMAL HIGH (ref 70–99)
Glucose-Capillary: 110 mg/dL — ABNORMAL HIGH (ref 70–99)
Glucose-Capillary: 114 mg/dL — ABNORMAL HIGH (ref 70–99)
Glucose-Capillary: 123 mg/dL — ABNORMAL HIGH (ref 70–99)
Glucose-Capillary: 127 mg/dL — ABNORMAL HIGH (ref 70–99)
Glucose-Capillary: 134 mg/dL — ABNORMAL HIGH (ref 70–99)
Glucose-Capillary: 142 mg/dL — ABNORMAL HIGH (ref 70–99)
Glucose-Capillary: 82 mg/dL (ref 70–99)
Glucose-Capillary: 95 mg/dL (ref 70–99)

## 2010-12-08 LAB — CBC
HCT: 35.5 % — ABNORMAL LOW (ref 36.0–46.0)
HCT: 36.7 % (ref 36.0–46.0)
Hemoglobin: 13.2 g/dL (ref 12.0–15.0)
Hemoglobin: 10.4 g/dL — ABNORMAL LOW (ref 12.0–15.0)
Hemoglobin: 11.6 g/dL — ABNORMAL LOW (ref 12.0–15.0)
Hemoglobin: 11.6 g/dL — ABNORMAL LOW (ref 12.0–15.0)
Hemoglobin: 11.8 g/dL — ABNORMAL LOW (ref 12.0–15.0)
Hemoglobin: 13 g/dL (ref 12.0–15.0)
MCHC: 33.2 g/dL (ref 30.0–36.0)
MCHC: 34 g/dL (ref 30.0–36.0)
MCHC: 32.5 g/dL (ref 30.0–36.0)
MCHC: 32.8 g/dL (ref 30.0–36.0)
MCHC: 32.8 g/dL (ref 30.0–36.0)
MCV: 89.3 fL (ref 78.0–100.0)
MCV: 89.2 fL (ref 78.0–100.0)
MCV: 90.7 fL (ref 78.0–100.0)
Platelets: 184 10*3/uL (ref 150–400)
Platelets: 225 10*3/uL (ref 150–400)
RBC: 4.35 MIL/uL (ref 3.87–5.11)
RBC: 4.42 MIL/uL (ref 3.87–5.11)
RBC: 3.54 MIL/uL — ABNORMAL LOW (ref 3.87–5.11)
RBC: 3.97 MIL/uL (ref 3.87–5.11)
RDW: 14.4 % (ref 11.5–15.5)
RDW: 14.8 % (ref 11.5–15.5)
RDW: 15.1 % (ref 11.5–15.5)
WBC: 14.6 10*3/uL — ABNORMAL HIGH (ref 4.0–10.5)
WBC: 12.6 10*3/uL — ABNORMAL HIGH (ref 4.0–10.5)
WBC: 13.8 10*3/uL — ABNORMAL HIGH (ref 4.0–10.5)

## 2010-12-08 LAB — COMPREHENSIVE METABOLIC PANEL
ALT: 23 U/L (ref 0–35)
ALT: 27 U/L (ref 0–35)
AST: 28 U/L (ref 0–37)
AST: 30 U/L (ref 0–37)
Albumin: 3.5 g/dL (ref 3.5–5.2)
Alkaline Phosphatase: 55 U/L (ref 39–117)
Alkaline Phosphatase: 55 U/L (ref 39–117)
CO2: 22 mEq/L (ref 19–32)
Calcium: 9.2 mg/dL (ref 8.4–10.5)
GFR calc Af Amer: >60 mL/min (ref >60)
GFR calc Af Amer: >60 mL/min (ref >60)
GFR calc non Af Amer: >60 mL/min (ref >60)
Potassium: 4.3 mEq/L (ref 3.5–5.1)
Potassium: 4.2 mEq/L (ref 3.5–5.1)
Sodium: 132 mEq/L — ABNORMAL LOW (ref 135–145)
Sodium: 137 mEq/L (ref 135–145)
Total Protein: 8 g/dL (ref 6.0–8.3)

## 2010-12-08 LAB — DIFFERENTIAL
Basophils Absolute: 0.1 10*3/uL (ref 0.0–0.1)
Basophils Relative: 1 % (ref 0–1)
Basophils Relative: 0 % (ref 0–1)
Basophils Relative: 1 % (ref 0–1)
Eosinophils Absolute: 0 10*3/uL (ref 0.0–0.7)
Eosinophils Absolute: 0.1 10*3/uL (ref 0.0–0.7)
Eosinophils Absolute: 0 10*3/uL (ref 0.0–0.7)
Eosinophils Relative: 0 % (ref 0–5)
Eosinophils Relative: 0 % (ref 0–5)
Lymphs Abs: 2.9 10*3/uL (ref 0.7–4.0)
Monocytes Absolute: 0.5 10*3/uL (ref 0.1–1.0)
Monocytes Absolute: 0.7 10*3/uL (ref 0.1–1.0)
Monocytes Absolute: 0.8 10*3/uL (ref 0.1–1.0)
Monocytes Relative: 4 % (ref 3–12)
Monocytes Relative: 5 % (ref 3–12)
Neutro Abs: 9.1 10*3/uL — ABNORMAL HIGH (ref 1.7–7.7)
Neutro Abs: 6.5 10*3/uL (ref 1.7–7.7)
Neutro Abs: 9.4 10*3/uL — ABNORMAL HIGH (ref 1.7–7.7)
Neutrophils Relative %: 52 % (ref 43–77)

## 2010-12-08 LAB — RAPID URINE DRUG SCREEN, HOSP PERFORMED
Amphetamines: NOT DETECTED
Barbiturates: NOT DETECTED
Benzodiazepines: POSITIVE — AB
Opiates: NOT DETECTED
Tetrahydrocannabinol: NOT DETECTED

## 2010-12-08 LAB — BASIC METABOLIC PANEL
BUN: 7 mg/dL (ref 6–23)
CO2: 21 mEq/L (ref 19–32)
CO2: 24 mEq/L (ref 19–32)
CO2: 24 mEq/L (ref 19–32)
Calcium: 8.9 mg/dL (ref 8.4–10.5)
Chloride: 106 mEq/L (ref 96–112)
Chloride: 110 mEq/L (ref 96–112)
Creatinine, Ser: 0.68 mg/dL (ref 0.4–1.2)
Creatinine, Ser: 0.68 mg/dL (ref 0.4–1.2)
GFR calc Af Amer: >60 mL/min (ref >60)
GFR calc Af Amer: >60 mL/min (ref >60)
GFR calc non Af Amer: >60 mL/min (ref >60)
Glucose, Bld: 100 mg/dL — ABNORMAL HIGH (ref 70–99)
Potassium: 4.4 mEq/L (ref 3.5–5.1)
Sodium: 137 mEq/L (ref 135–145)

## 2010-12-08 LAB — URINALYSIS, MICROSCOPIC ONLY
Glucose, UA: NEGATIVE mg/dL
Leukocytes, UA: NEGATIVE
Protein, ur: NEGATIVE mg/dL
Specific Gravity, Urine: 1.012 (ref 1.005–1.030)
Urobilinogen, UA: 0.2 mg/dL (ref 0.0–1.0)

## 2010-12-08 LAB — URINE MICROSCOPIC-ADD ON

## 2010-12-08 LAB — HEMOGLOBIN A1C
Hgb A1c MFr Bld: 6.3 % — ABNORMAL HIGH (ref 4.6–6.1)
Mean Plasma Glucose: 134 mg/dL

## 2010-12-08 LAB — ETHANOL: Alcohol, Ethyl (B): <5 mg/dL (ref 0–10)

## 2010-12-08 LAB — LIPASE, BLOOD: Lipase: 18 U/L (ref 11–59)

## 2010-12-08 LAB — TSH: TSH: 1.086 u[IU]/mL (ref 0.350–4.500)

## 2010-12-08 LAB — URINE CULTURE

## 2010-12-08 LAB — TRICYCLICS SCREEN, URINE: TCA Scrn: NOT DETECTED

## 2010-12-10 LAB — BASIC METABOLIC PANEL
BUN: 7 mg/dL (ref 6–23)
CO2: 21 mEq/L (ref 19–32)
CO2: 23 mEq/L (ref 19–32)
CO2: 23 mEq/L (ref 19–32)
Calcium: 8.8 mg/dL (ref 8.4–10.5)
Calcium: 8.9 mg/dL (ref 8.4–10.5)
Chloride: 102 mEq/L (ref 96–112)
Creatinine, Ser: 0.53 mg/dL (ref 0.4–1.2)
Creatinine, Ser: 0.64 mg/dL (ref 0.4–1.2)
GFR calc Af Amer: >60 mL/min (ref >60)
GFR calc non Af Amer: >60 mL/min (ref >60)
GFR calc non Af Amer: >60 mL/min (ref >60)
Glucose, Bld: 131 mg/dL — ABNORMAL HIGH (ref 70–99)
Glucose, Bld: 169 mg/dL — ABNORMAL HIGH (ref 70–99)
Potassium: 4 mEq/L (ref 3.5–5.1)
Sodium: 135 mEq/L (ref 135–145)

## 2010-12-10 LAB — GLUCOSE, CAPILLARY
Glucose-Capillary: 139 mg/dL — ABNORMAL HIGH (ref 70–99)
Glucose-Capillary: 142 mg/dL — ABNORMAL HIGH (ref 70–99)
Glucose-Capillary: 143 mg/dL — ABNORMAL HIGH (ref 70–99)
Glucose-Capillary: 151 mg/dL — ABNORMAL HIGH (ref 70–99)
Glucose-Capillary: 185 mg/dL — ABNORMAL HIGH (ref 70–99)
Glucose-Capillary: 199 mg/dL — ABNORMAL HIGH (ref 70–99)
Glucose-Capillary: 208 mg/dL — ABNORMAL HIGH (ref 70–99)
Glucose-Capillary: 229 mg/dL — ABNORMAL HIGH (ref 70–99)
Glucose-Capillary: 108 mg/dL — ABNORMAL HIGH (ref 70–99)
Glucose-Capillary: 109 mg/dL — ABNORMAL HIGH (ref 70–99)
Glucose-Capillary: 119 mg/dL — ABNORMAL HIGH (ref 70–99)
Glucose-Capillary: 121 mg/dL — ABNORMAL HIGH (ref 70–99)
Glucose-Capillary: 132 mg/dL — ABNORMAL HIGH (ref 70–99)
Glucose-Capillary: 136 mg/dL — ABNORMAL HIGH (ref 70–99)
Glucose-Capillary: 139 mg/dL — ABNORMAL HIGH (ref 70–99)
Glucose-Capillary: 146 mg/dL — ABNORMAL HIGH (ref 70–99)
Glucose-Capillary: 146 mg/dL — ABNORMAL HIGH (ref 70–99)
Glucose-Capillary: 147 mg/dL — ABNORMAL HIGH (ref 70–99)
Glucose-Capillary: 149 mg/dL — ABNORMAL HIGH (ref 70–99)
Glucose-Capillary: 152 mg/dL — ABNORMAL HIGH (ref 70–99)
Glucose-Capillary: 159 mg/dL — ABNORMAL HIGH (ref 70–99)
Glucose-Capillary: 159 mg/dL — ABNORMAL HIGH (ref 70–99)
Glucose-Capillary: 164 mg/dL — ABNORMAL HIGH (ref 70–99)
Glucose-Capillary: 169 mg/dL — ABNORMAL HIGH (ref 70–99)
Glucose-Capillary: 171 mg/dL — ABNORMAL HIGH (ref 70–99)
Glucose-Capillary: 177 mg/dL — ABNORMAL HIGH (ref 70–99)
Glucose-Capillary: 178 mg/dL — ABNORMAL HIGH (ref 70–99)
Glucose-Capillary: 190 mg/dL — ABNORMAL HIGH (ref 70–99)
Glucose-Capillary: 198 mg/dL — ABNORMAL HIGH (ref 70–99)
Glucose-Capillary: 211 mg/dL — ABNORMAL HIGH (ref 70–99)
Glucose-Capillary: 218 mg/dL — ABNORMAL HIGH (ref 70–99)
Glucose-Capillary: 218 mg/dL — ABNORMAL HIGH (ref 70–99)
Glucose-Capillary: 236 mg/dL — ABNORMAL HIGH (ref 70–99)
Glucose-Capillary: 242 mg/dL — ABNORMAL HIGH (ref 70–99)
Glucose-Capillary: 257 mg/dL — ABNORMAL HIGH (ref 70–99)
Glucose-Capillary: 292 mg/dL — ABNORMAL HIGH (ref 70–99)

## 2010-12-11 LAB — BASIC METABOLIC PANEL
BUN: 7 mg/dL (ref 6–23)
BUN: 9 mg/dL (ref 6–23)
CO2: 20 mEq/L (ref 19–32)
CO2: 25 mEq/L (ref 19–32)
Calcium: 9 mg/dL (ref 8.4–10.5)
Calcium: 9 mg/dL (ref 8.4–10.5)
Calcium: 9.1 mg/dL (ref 8.4–10.5)
Chloride: 100 mEq/L (ref 96–112)
Chloride: 104 mEq/L (ref 96–112)
Creatinine, Ser: 0.54 mg/dL (ref 0.4–1.2)
Creatinine, Ser: 0.6 mg/dL (ref 0.4–1.2)
Creatinine, Ser: 0.66 mg/dL (ref 0.4–1.2)
Creatinine, Ser: 0.71 mg/dL (ref 0.4–1.2)
GFR calc Af Amer: >60 mL/min (ref >60)
GFR calc Af Amer: >60 mL/min (ref >60)
GFR calc Af Amer: >60 mL/min (ref >60)
GFR calc non Af Amer: >60 mL/min (ref >60)
GFR calc non Af Amer: >60 mL/min (ref >60)
GFR calc non Af Amer: >60 mL/min (ref >60)
Glucose, Bld: 163 mg/dL — ABNORMAL HIGH (ref 70–99)
Glucose, Bld: 212 mg/dL — ABNORMAL HIGH (ref 70–99)
Potassium: 4.1 mEq/L (ref 3.5–5.1)
Potassium: 4.2 mEq/L (ref 3.5–5.1)
Sodium: 137 mEq/L (ref 135–145)

## 2010-12-11 LAB — CBC
HCT: 37 % (ref 36.0–46.0)
HCT: 37.3 % (ref 36.0–46.0)
HCT: 38.8 % (ref 36.0–46.0)
HCT: 41.1 % (ref 36.0–46.0)
Hemoglobin: 12.6 g/dL (ref 12.0–15.0)
Hemoglobin: 12.4 g/dL (ref 12.0–15.0)
Hemoglobin: 13.2 g/dL (ref 12.0–15.0)
Hemoglobin: 13.9 g/dL (ref 12.0–15.0)
MCHC: 33.1 g/dL (ref 30.0–36.0)
MCHC: 33.2 g/dL (ref 30.0–36.0)
MCHC: 33.3 g/dL (ref 30.0–36.0)
MCHC: 33.7 g/dL (ref 30.0–36.0)
MCHC: 34 g/dL (ref 30.0–36.0)
MCHC: 34 g/dL (ref 30.0–36.0)
MCV: 89.1 fL (ref 78.0–100.0)
MCV: 89.2 fL (ref 78.0–100.0)
MCV: 88.5 fL (ref 78.0–100.0)
Platelets: 183 10*3/uL (ref 150–400)
Platelets: 181 10*3/uL (ref 150–400)
Platelets: 182 10*3/uL (ref 150–400)
Platelets: 184 10*3/uL (ref 150–400)
Platelets: 225 10*3/uL (ref 150–400)
RBC: 4.15 MIL/uL (ref 3.87–5.11)
RBC: 4.18 MIL/uL (ref 3.87–5.11)
RBC: 4.13 MIL/uL (ref 3.87–5.11)
RDW: 13.1 % (ref 11.5–15.5)
RDW: 13.4 % (ref 11.5–15.5)
RDW: 13.4 % (ref 11.5–15.5)
RDW: 13.6 % (ref 11.5–15.5)
RDW: 13.6 % (ref 11.5–15.5)
WBC: 13.3 10*3/uL — ABNORMAL HIGH (ref 4.0–10.5)
WBC: 13 10*3/uL — ABNORMAL HIGH (ref 4.0–10.5)

## 2010-12-11 LAB — GLUCOSE, CAPILLARY
Glucose-Capillary: 127 mg/dL — ABNORMAL HIGH (ref 70–99)
Glucose-Capillary: 131 mg/dL — ABNORMAL HIGH (ref 70–99)
Glucose-Capillary: 134 mg/dL — ABNORMAL HIGH (ref 70–99)
Glucose-Capillary: 154 mg/dL — ABNORMAL HIGH (ref 70–99)
Glucose-Capillary: 155 mg/dL — ABNORMAL HIGH (ref 70–99)
Glucose-Capillary: 158 mg/dL — ABNORMAL HIGH (ref 70–99)
Glucose-Capillary: 164 mg/dL — ABNORMAL HIGH (ref 70–99)
Glucose-Capillary: 165 mg/dL — ABNORMAL HIGH (ref 70–99)
Glucose-Capillary: 170 mg/dL — ABNORMAL HIGH (ref 70–99)
Glucose-Capillary: 176 mg/dL — ABNORMAL HIGH (ref 70–99)
Glucose-Capillary: 180 mg/dL — ABNORMAL HIGH (ref 70–99)
Glucose-Capillary: 187 mg/dL — ABNORMAL HIGH (ref 70–99)
Glucose-Capillary: 192 mg/dL — ABNORMAL HIGH (ref 70–99)
Glucose-Capillary: 216 mg/dL — ABNORMAL HIGH (ref 70–99)
Glucose-Capillary: 217 mg/dL — ABNORMAL HIGH (ref 70–99)
Glucose-Capillary: 98 mg/dL (ref 70–99)

## 2010-12-11 LAB — URINE MICROSCOPIC-ADD ON

## 2010-12-11 LAB — COMPREHENSIVE METABOLIC PANEL
ALT: 18 U/L (ref 0–35)
AST: 20 U/L (ref 0–37)
Albumin: 3.3 g/dL — ABNORMAL LOW (ref 3.5–5.2)
Albumin: 3.2 g/dL — ABNORMAL LOW (ref 3.5–5.2)
Alkaline Phosphatase: 45 U/L (ref 39–117)
Alkaline Phosphatase: 41 U/L (ref 39–117)
BUN: 7 mg/dL (ref 6–23)
BUN: 8 mg/dL (ref 6–23)
CO2: 22 mEq/L (ref 19–32)
Calcium: 9.2 mg/dL (ref 8.4–10.5)
Calcium: 9.5 mg/dL (ref 8.4–10.5)
Chloride: 104 mEq/L (ref 96–112)
Creatinine, Ser: 0.66 mg/dL (ref 0.4–1.2)
GFR calc non Af Amer: >60 mL/min (ref >60)
Glucose, Bld: 151 mg/dL — ABNORMAL HIGH (ref 70–99)
Potassium: 4.7 mEq/L (ref 3.5–5.1)
Potassium: 4.1 mEq/L (ref 3.5–5.1)
Sodium: 138 mEq/L (ref 135–145)
Total Bilirubin: 0.4 mg/dL (ref 0.3–1.2)
Total Protein: 7.1 g/dL (ref 6.0–8.3)
Total Protein: 8.2 g/dL (ref 6.0–8.3)

## 2010-12-11 LAB — PHOSPHORUS
Phosphorus: 4.2 mg/dL (ref 2.3–4.6)
Phosphorus: 5.8 mg/dL — ABNORMAL HIGH (ref 2.3–4.6)

## 2010-12-11 LAB — DIFFERENTIAL
Basophils Absolute: 0.1 10*3/uL (ref 0.0–0.1)
Basophils Relative: 0 % (ref 0–1)
Basophils Relative: 1 % (ref 0–1)
Basophils Relative: 1 % (ref 0–1)
Eosinophils Absolute: 0.2 10*3/uL (ref 0.0–0.7)
Eosinophils Absolute: 0.1 10*3/uL (ref 0.0–0.7)
Eosinophils Relative: 1 % (ref 0–5)
Lymphocytes Relative: 19 % (ref 12–46)
Lymphocytes Relative: 36 % (ref 12–46)
Lymphs Abs: 3 10*3/uL (ref 0.7–4.0)
Lymphs Abs: 5.2 10*3/uL — ABNORMAL HIGH (ref 0.7–4.0)
Lymphs Abs: 4 10*3/uL (ref 0.7–4.0)
Monocytes Absolute: 0.8 10*3/uL (ref 0.1–1.0)
Monocytes Relative: 3 % (ref 3–12)
Monocytes Relative: 6 % (ref 3–12)
Monocytes Relative: 6 % (ref 3–12)
Monocytes Relative: 6 % (ref 3–12)
Neutro Abs: 12.4 10*3/uL — ABNORMAL HIGH (ref 1.7–7.7)
Neutro Abs: 7.8 10*3/uL — ABNORMAL HIGH (ref 1.7–7.7)
Neutro Abs: 8 10*3/uL — ABNORMAL HIGH (ref 1.7–7.7)
Neutrophils Relative %: 57 % (ref 43–77)
Neutrophils Relative %: 78 % — ABNORMAL HIGH (ref 43–77)
Neutrophils Relative %: 62 % (ref 43–77)

## 2010-12-11 LAB — URINALYSIS, ROUTINE W REFLEX MICROSCOPIC
Bilirubin Urine: NEGATIVE
Glucose, UA: 250 mg/dL — AB
Hgb urine dipstick: NEGATIVE
Hgb urine dipstick: NEGATIVE
Ketones, ur: NEGATIVE mg/dL
Nitrite: NEGATIVE
Protein, ur: NEGATIVE mg/dL
Protein, ur: NEGATIVE mg/dL
Specific Gravity, Urine: 1.021 (ref 1.005–1.030)
Urobilinogen, UA: 0.2 mg/dL (ref 0.0–1.0)
pH: 7 (ref 5.0–8.0)

## 2010-12-11 LAB — RAPID URINE DRUG SCREEN, HOSP PERFORMED
Amphetamines: NOT DETECTED
Barbiturates: NOT DETECTED
Benzodiazepines: NOT DETECTED
Cocaine: NOT DETECTED
Cocaine: NOT DETECTED
Opiates: NOT DETECTED
Tetrahydrocannabinol: NOT DETECTED

## 2010-12-11 LAB — PREGNANCY, URINE
Preg Test, Ur: NEGATIVE
Preg Test, Ur: NEGATIVE

## 2010-12-11 LAB — CREATININE, URINE, RANDOM: Creatinine, Urine: 33 mg/dL

## 2010-12-11 LAB — OSMOLALITY, URINE: Osmolality, Ur: 236 mOsm/kg — ABNORMAL LOW (ref 390–1090)

## 2010-12-11 LAB — LACTIC ACID, PLASMA
Lactic Acid, Venous: 3.3 mmol/L — ABNORMAL HIGH (ref 0.5–2.2)
Lactic Acid, Venous: 3.7 mmol/L — ABNORMAL HIGH (ref 0.5–2.2)

## 2010-12-11 LAB — MAGNESIUM
Magnesium: 1.8 mg/dL (ref 1.5–2.5)
Magnesium: 2.1 mg/dL (ref 1.5–2.5)

## 2010-12-11 LAB — ETHANOL: Alcohol, Ethyl (B): <5 mg/dL (ref 0–10)

## 2010-12-11 LAB — OSMOLALITY: Osmolality: 275 mOsm/kg (ref 275–300)

## 2010-12-12 LAB — GLUCOSE, CAPILLARY
Glucose-Capillary: 112 mg/dL — ABNORMAL HIGH (ref 70–99)
Glucose-Capillary: 122 mg/dL — ABNORMAL HIGH (ref 70–99)
Glucose-Capillary: 139 mg/dL — ABNORMAL HIGH (ref 70–99)
Glucose-Capillary: 163 mg/dL — ABNORMAL HIGH (ref 70–99)
Glucose-Capillary: 184 mg/dL — ABNORMAL HIGH (ref 70–99)
Glucose-Capillary: 233 mg/dL — ABNORMAL HIGH (ref 70–99)
Glucose-Capillary: 277 mg/dL — ABNORMAL HIGH (ref 70–99)
Glucose-Capillary: 283 mg/dL — ABNORMAL HIGH (ref 70–99)
Glucose-Capillary: 323 mg/dL — ABNORMAL HIGH (ref 70–99)
Glucose-Capillary: 332 mg/dL — ABNORMAL HIGH (ref 70–99)
Glucose-Capillary: 364 mg/dL — ABNORMAL HIGH (ref 70–99)

## 2010-12-12 LAB — DIFFERENTIAL
Basophils Absolute: 0.1 10*3/uL (ref 0.0–0.1)
Basophils Relative: 1 % (ref 0–1)
Eosinophils Absolute: 0.1 10*3/uL (ref 0.0–0.7)
Eosinophils Relative: 1 % (ref 0–5)
Monocytes Absolute: 0.5 10*3/uL (ref 0.1–1.0)
Monocytes Relative: 5 % (ref 3–12)

## 2010-12-12 LAB — RAPID URINE DRUG SCREEN, HOSP PERFORMED
Amphetamines: NOT DETECTED
Barbiturates: NOT DETECTED
Opiates: NOT DETECTED

## 2010-12-12 LAB — D-DIMER, QUANTITATIVE: D-Dimer, Quant: <0.22 ug/mL-FEU (ref 0.00–0.48)

## 2010-12-12 LAB — CBC
HCT: 38.9 % (ref 36.0–46.0)
Hemoglobin: 13.1 g/dL (ref 12.0–15.0)
MCHC: 33.6 g/dL (ref 30.0–36.0)
MCV: 89.2 fL (ref 78.0–100.0)
RBC: 4.36 MIL/uL (ref 3.87–5.11)
RDW: 13.5 % (ref 11.5–15.5)

## 2010-12-12 LAB — BASIC METABOLIC PANEL
CO2: 22 mEq/L (ref 19–32)
Calcium: 9.2 mg/dL (ref 8.4–10.5)
Chloride: 103 mEq/L (ref 96–112)
Glucose, Bld: 147 mg/dL — ABNORMAL HIGH (ref 70–99)
Potassium: 4.4 mEq/L (ref 3.5–5.1)
Sodium: 134 mEq/L — ABNORMAL LOW (ref 135–145)

## 2010-12-12 LAB — POCT URINALYSIS DIP (DEVICE)
Glucose, UA: NEGATIVE mg/dL
Specific Gravity, Urine: 1.01 (ref 1.005–1.030)
Urobilinogen, UA: 0.2 mg/dL (ref 0.0–1.0)

## 2010-12-18 LAB — CBC
HCT: 38.6 % (ref 36.0–46.0)
MCV: 84.4 fL (ref 78.0–100.0)
Platelets: 224 10*3/uL (ref 150–400)
RBC: 4.57 MIL/uL (ref 3.87–5.11)
WBC: 10 10*3/uL (ref 4.0–10.5)

## 2010-12-18 LAB — DIFFERENTIAL
Lymphocytes Relative: 22 % (ref 12–46)
Lymphs Abs: 2.2 10*3/uL (ref 0.7–4.0)
Monocytes Relative: 5 % (ref 3–12)
Neutrophils Relative %: 72 % (ref 43–77)

## 2010-12-18 LAB — BASIC METABOLIC PANEL
BUN: 6 mg/dL (ref 6–23)
Chloride: 102 mEq/L (ref 96–112)
Creatinine, Ser: 0.5 mg/dL (ref 0.4–1.2)
GFR calc Af Amer: >60 mL/min (ref >60)
GFR calc non Af Amer: >60 mL/min (ref >60)
Potassium: 4.1 mEq/L (ref 3.5–5.1)

## 2010-12-18 LAB — POCT TOXICOLOGY PANEL
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
## 2011-02-07 ENCOUNTER — Emergency Department (HOSPITAL_COMMUNITY)
Admission: EM | Admit: 2011-02-07 | Discharge: 2011-02-07 | Disposition: A | Discharge status: Home / Self Care | Payer: Medicare Other | Attending: Emergency Medicine | Admitting: Emergency Medicine

## 2011-02-07 ENCOUNTER — Emergency Department (HOSPITAL_COMMUNITY): Payer: Medicare Other

## 2011-02-07 DIAGNOSIS — E785 Hyperlipidemia, unspecified: Secondary | ICD-10-CM | POA: Insufficient documentation

## 2011-02-07 DIAGNOSIS — E119 Type 2 diabetes mellitus without complications: Secondary | ICD-10-CM | POA: Insufficient documentation

## 2011-02-07 DIAGNOSIS — E669 Obesity, unspecified: Secondary | ICD-10-CM | POA: Insufficient documentation

## 2011-02-07 DIAGNOSIS — M25519 Pain in unspecified shoulder: Secondary | ICD-10-CM | POA: Insufficient documentation

## 2011-02-07 DIAGNOSIS — R569 Unspecified convulsions: Secondary | ICD-10-CM | POA: Insufficient documentation

## 2011-02-07 DIAGNOSIS — I10 Essential (primary) hypertension: Secondary | ICD-10-CM | POA: Insufficient documentation

## 2011-02-07 DIAGNOSIS — F209 Schizophrenia, unspecified: Secondary | ICD-10-CM | POA: Insufficient documentation

## 2011-02-07 DIAGNOSIS — Y92009 Unspecified place in unspecified non-institutional (private) residence as the place of occurrence of the external cause: Secondary | ICD-10-CM | POA: Insufficient documentation

## 2011-02-07 DIAGNOSIS — F79 Unspecified intellectual disabilities: Secondary | ICD-10-CM | POA: Insufficient documentation

## 2011-02-07 DIAGNOSIS — W2209XA Striking against other stationary object, initial encounter: Secondary | ICD-10-CM | POA: Insufficient documentation

## 2011-02-07 DIAGNOSIS — S40019A Contusion of unspecified shoulder, initial encounter: Secondary | ICD-10-CM | POA: Insufficient documentation

## 2011-02-07 DIAGNOSIS — K219 Gastro-esophageal reflux disease without esophagitis: Secondary | ICD-10-CM | POA: Insufficient documentation

## 2011-03-10 ENCOUNTER — Other Ambulatory Visit: Payer: Self-pay | Admitting: Family Medicine

## 2011-03-10 ENCOUNTER — Other Ambulatory Visit (HOSPITAL_COMMUNITY)
Admission: RE | Admit: 2011-03-10 | Discharge: 2011-03-10 | Disposition: A | Discharge status: Home / Self Care | Payer: Medicare Other | Source: Ambulatory Visit | Attending: Internal Medicine | Admitting: Internal Medicine

## 2011-03-10 DIAGNOSIS — Z01419 Encounter for gynecological examination (general) (routine) without abnormal findings: Secondary | ICD-10-CM | POA: Insufficient documentation

## 2011-03-12 ENCOUNTER — Emergency Department (HOSPITAL_COMMUNITY)
Admission: EM | Admit: 2011-03-12 | Discharge: 2011-03-17 | Disposition: A | Discharge status: Other Acute Inpatient Hospital | Payer: Medicare Other | Attending: Emergency Medicine | Admitting: Emergency Medicine

## 2011-03-12 DIAGNOSIS — R443 Hallucinations, unspecified: Secondary | ICD-10-CM | POA: Insufficient documentation

## 2011-03-12 DIAGNOSIS — E119 Type 2 diabetes mellitus without complications: Secondary | ICD-10-CM | POA: Insufficient documentation

## 2011-03-12 DIAGNOSIS — F79 Unspecified intellectual disabilities: Secondary | ICD-10-CM | POA: Insufficient documentation

## 2011-03-12 DIAGNOSIS — Z79899 Other long term (current) drug therapy: Secondary | ICD-10-CM | POA: Insufficient documentation

## 2011-03-12 DIAGNOSIS — IMO0002 Reserved for concepts with insufficient information to code with codable children: Secondary | ICD-10-CM | POA: Insufficient documentation

## 2011-03-12 LAB — GLUCOSE, CAPILLARY

## 2011-03-13 LAB — CBC
HCT: 37.6 % (ref 36.0–46.0)
Hemoglobin: 12.9 g/dL (ref 12.0–15.0)
MCV: 88.5 fL (ref 78.0–100.0)
Platelets: 168 10*3/uL (ref 150–400)
RBC: 4.25 MIL/uL (ref 3.87–5.11)
WBC: 14.4 10*3/uL — ABNORMAL HIGH (ref 4.0–10.5)

## 2011-03-13 LAB — VALPROIC ACID LEVEL: Valproic Acid Lvl: <10 ug/mL — ABNORMAL LOW (ref 50.0–100.0)

## 2011-03-13 LAB — DIFFERENTIAL
Eosinophils Absolute: 0.1 10*3/uL (ref 0.0–0.7)
Lymphocytes Relative: 37 % (ref 12–46)
Lymphs Abs: 5.3 10*3/uL — ABNORMAL HIGH (ref 0.7–4.0)
Monocytes Relative: 9 % (ref 3–12)
Neutro Abs: 7.8 10*3/uL — ABNORMAL HIGH (ref 1.7–7.7)
Neutrophils Relative %: 54 % (ref 43–77)

## 2011-03-13 LAB — RAPID URINE DRUG SCREEN, HOSP PERFORMED
Amphetamines: NOT DETECTED
Benzodiazepines: NOT DETECTED
Tetrahydrocannabinol: NOT DETECTED

## 2011-03-13 LAB — COMPREHENSIVE METABOLIC PANEL
ALT: 21 U/L (ref 0–35)
Albumin: 3.3 g/dL — ABNORMAL LOW (ref 3.5–5.2)
Calcium: 9.3 mg/dL (ref 8.4–10.5)
GFR calc Af Amer: >60 mL/min (ref >60)
Glucose, Bld: 209 mg/dL — ABNORMAL HIGH (ref 70–99)
Sodium: 133 mEq/L — ABNORMAL LOW (ref 135–145)
Total Protein: 7.3 g/dL (ref 6.0–8.3)

## 2011-03-13 LAB — URINE MICROSCOPIC-ADD ON

## 2011-03-13 LAB — URINALYSIS, ROUTINE W REFLEX MICROSCOPIC
Nitrite: NEGATIVE
Specific Gravity, Urine: 1.007 (ref 1.005–1.030)
Urobilinogen, UA: 0.2 mg/dL (ref 0.0–1.0)

## 2011-03-13 LAB — POCT PREGNANCY, URINE: Preg Test, Ur: NEGATIVE

## 2011-03-13 LAB — ETHANOL: Alcohol, Ethyl (B): <11 mg/dL (ref 0–11)

## 2011-03-14 LAB — GLUCOSE, CAPILLARY
Glucose-Capillary: 176 mg/dL — ABNORMAL HIGH (ref 70–99)
Glucose-Capillary: 164 mg/dL — ABNORMAL HIGH (ref 70–99)

## 2011-03-15 LAB — GLUCOSE, CAPILLARY
Glucose-Capillary: 106 mg/dL — ABNORMAL HIGH (ref 70–99)
Glucose-Capillary: 233 mg/dL — ABNORMAL HIGH (ref 70–99)

## 2011-03-16 LAB — GLUCOSE, CAPILLARY
Glucose-Capillary: 146 mg/dL — ABNORMAL HIGH (ref 70–99)
Glucose-Capillary: 169 mg/dL — ABNORMAL HIGH (ref 70–99)
Glucose-Capillary: 215 mg/dL — ABNORMAL HIGH (ref 70–99)
Glucose-Capillary: 194 mg/dL — ABNORMAL HIGH (ref 70–99)

## 2011-03-17 LAB — GLUCOSE, CAPILLARY

## 2011-04-02 ENCOUNTER — Emergency Department (HOSPITAL_COMMUNITY)
Admission: EM | Admit: 2011-04-02 | Discharge: 2011-04-03 | Disposition: A | Discharge status: Home / Self Care | Payer: Medicare Other | Attending: Emergency Medicine | Admitting: Emergency Medicine

## 2011-04-02 DIAGNOSIS — I10 Essential (primary) hypertension: Secondary | ICD-10-CM | POA: Insufficient documentation

## 2011-04-02 DIAGNOSIS — F79 Unspecified intellectual disabilities: Secondary | ICD-10-CM | POA: Insufficient documentation

## 2011-04-02 DIAGNOSIS — E119 Type 2 diabetes mellitus without complications: Secondary | ICD-10-CM | POA: Insufficient documentation

## 2011-04-02 DIAGNOSIS — Z794 Long term (current) use of insulin: Secondary | ICD-10-CM | POA: Insufficient documentation

## 2011-04-02 DIAGNOSIS — F209 Schizophrenia, unspecified: Secondary | ICD-10-CM | POA: Insufficient documentation

## 2011-04-02 DIAGNOSIS — F411 Generalized anxiety disorder: Secondary | ICD-10-CM | POA: Insufficient documentation

## 2011-04-02 LAB — COMPREHENSIVE METABOLIC PANEL
Albumin: 3.3 g/dL — ABNORMAL LOW (ref 3.5–5.2)
BUN: 12 mg/dL (ref 6–23)
Calcium: 9.8 mg/dL (ref 8.4–10.5)
Creatinine, Ser: 0.61 mg/dL (ref 0.50–1.10)
GFR calc Af Amer: >60 mL/min (ref >60)
Glucose, Bld: 149 mg/dL — ABNORMAL HIGH (ref 70–99)
Total Protein: 7.7 g/dL (ref 6.0–8.3)

## 2011-04-02 LAB — RAPID URINE DRUG SCREEN, HOSP PERFORMED
Amphetamines: NOT DETECTED
Barbiturates: NOT DETECTED
Benzodiazepines: NOT DETECTED
Cocaine: NOT DETECTED
Tetrahydrocannabinol: NOT DETECTED

## 2011-04-02 LAB — CBC
HCT: 38.5 % (ref 36.0–46.0)
Hemoglobin: 12.8 g/dL (ref 12.0–15.0)
MCHC: 33.2 g/dL (ref 30.0–36.0)
RBC: 4.38 MIL/uL (ref 3.87–5.11)

## 2011-04-02 LAB — DIFFERENTIAL
Basophils Absolute: 0 10*3/uL (ref 0.0–0.1)
Lymphocytes Relative: 36 % (ref 12–46)
Monocytes Absolute: 1 10*3/uL (ref 0.1–1.0)
Neutro Abs: 7.3 10*3/uL (ref 1.7–7.7)
Neutrophils Relative %: 56 % (ref 43–77)

## 2011-04-02 LAB — ETHANOL: Alcohol, Ethyl (B): <11 mg/dL (ref 0–11)

## 2011-04-03 LAB — GLUCOSE, CAPILLARY
Glucose-Capillary: 144 mg/dL — ABNORMAL HIGH (ref 70–99)
Glucose-Capillary: 263 mg/dL — ABNORMAL HIGH (ref 70–99)

## 2011-05-21 ENCOUNTER — Inpatient Hospital Stay (INDEPENDENT_AMBULATORY_CARE_PROVIDER_SITE_OTHER)
Admission: RE | Admit: 2011-05-21 | Discharge: 2011-05-21 | Disposition: A | Discharge status: Home / Self Care | Payer: Medicare Other | Source: Ambulatory Visit | Attending: Family Medicine | Admitting: Family Medicine

## 2011-05-21 DIAGNOSIS — N390 Urinary tract infection, site not specified: Secondary | ICD-10-CM

## 2011-05-21 LAB — POCT URINALYSIS DIP (DEVICE)
Bilirubin Urine: NEGATIVE
Glucose, UA: NEGATIVE mg/dL
Hgb urine dipstick: NEGATIVE
Nitrite: NEGATIVE
Urobilinogen, UA: 0.2 mg/dL (ref 0.0–1.0)
pH: 7 (ref 5.0–8.0)

## 2011-06-02 ENCOUNTER — Inpatient Hospital Stay (INDEPENDENT_AMBULATORY_CARE_PROVIDER_SITE_OTHER)
Admission: RE | Admit: 2011-06-02 | Discharge: 2011-06-02 | Disposition: A | Discharge status: Home / Self Care | Payer: Medicare Other | Source: Ambulatory Visit | Attending: Family Medicine | Admitting: Family Medicine

## 2011-06-02 DIAGNOSIS — R350 Frequency of micturition: Secondary | ICD-10-CM

## 2011-06-02 DIAGNOSIS — R81 Glycosuria: Secondary | ICD-10-CM

## 2011-06-02 LAB — POCT URINALYSIS DIP (DEVICE)
Bilirubin Urine: NEGATIVE
Nitrite: NEGATIVE
Protein, ur: NEGATIVE mg/dL
pH: 7 (ref 5.0–8.0)

## 2011-06-02 LAB — POCT I-STAT, CHEM 8
Calcium, Ion: 1.26 mmol/L (ref 1.12–1.32)
HCT: 41 % (ref 36.0–46.0)
Sodium: 141 mEq/L (ref 135–145)
TCO2: 22 mmol/L (ref 0–100)

## 2011-06-03 LAB — URINE CULTURE

## 2011-06-04 ENCOUNTER — Emergency Department (HOSPITAL_COMMUNITY)
Admission: EM | Admit: 2011-06-04 | Discharge: 2011-06-05 | Disposition: A | Discharge status: Home / Self Care | Payer: Medicare Other | Attending: Emergency Medicine | Admitting: Emergency Medicine

## 2011-06-04 DIAGNOSIS — Z046 Encounter for general psychiatric examination, requested by authority: Secondary | ICD-10-CM | POA: Insufficient documentation

## 2011-06-04 DIAGNOSIS — E119 Type 2 diabetes mellitus without complications: Secondary | ICD-10-CM | POA: Insufficient documentation

## 2011-06-04 DIAGNOSIS — F79 Unspecified intellectual disabilities: Secondary | ICD-10-CM | POA: Insufficient documentation

## 2011-06-04 DIAGNOSIS — R4182 Altered mental status, unspecified: Secondary | ICD-10-CM | POA: Insufficient documentation

## 2011-06-04 DIAGNOSIS — I10 Essential (primary) hypertension: Secondary | ICD-10-CM | POA: Insufficient documentation

## 2011-06-04 LAB — RAPID URINE DRUG SCREEN, HOSP PERFORMED
Amphetamines: NOT DETECTED
Barbiturates: NOT DETECTED
Benzodiazepines: NOT DETECTED
Cocaine: NOT DETECTED
Tetrahydrocannabinol: NOT DETECTED

## 2011-06-04 LAB — COMPREHENSIVE METABOLIC PANEL
ALT: 18 U/L (ref 0–35)
AST: 21 U/L (ref 0–37)
Albumin: 3.1 g/dL — ABNORMAL LOW (ref 3.5–5.2)
Chloride: 101 mEq/L (ref 96–112)
Creatinine, Ser: 0.65 mg/dL (ref 0.50–1.10)
Potassium: 4.4 mEq/L (ref 3.5–5.1)
Sodium: 134 mEq/L — ABNORMAL LOW (ref 135–145)
Total Bilirubin: 0.1 mg/dL — ABNORMAL LOW (ref 0.3–1.2)

## 2011-06-04 LAB — CBC
MCV: 88.2 fL (ref 78.0–100.0)
Platelets: 200 10*3/uL (ref 150–400)
RDW: 14.1 % (ref 11.5–15.5)
WBC: 13.7 10*3/uL — ABNORMAL HIGH (ref 4.0–10.5)

## 2011-06-04 LAB — DIFFERENTIAL
Basophils Absolute: 0 10*3/uL (ref 0.0–0.1)
Basophils Relative: 0 % (ref 0–1)
Eosinophils Absolute: 0.1 10*3/uL (ref 0.0–0.7)
Eosinophils Relative: 0 % (ref 0–5)
Neutrophils Relative %: 56 % (ref 43–77)

## 2011-06-05 LAB — GLUCOSE, CAPILLARY: Glucose-Capillary: 175 mg/dL — ABNORMAL HIGH (ref 70–99)

## 2011-06-08 LAB — CBC
HCT: 36.7
Hemoglobin: 12.2
MCHC: 33.4
MCV: 83.7
Platelets: 264
RBC: 4.38
RDW: 13.8
WBC: 14.7 — ABNORMAL HIGH

## 2011-06-08 LAB — DIFFERENTIAL
Basophils Absolute: 0.1
Basophils Relative: 0
Eosinophils Absolute: 0.2
Eosinophils Relative: 1
Lymphocytes Relative: 25
Lymphs Abs: 3.7
Monocytes Absolute: 0.6
Monocytes Relative: 4
Neutro Abs: 10.2 — ABNORMAL HIGH
Neutrophils Relative %: 70

## 2011-06-08 LAB — POCT I-STAT, CHEM 8
BUN: 10
Creatinine, Ser: 0.9
Potassium: 3.8
Sodium: 133 — ABNORMAL LOW

## 2011-06-08 LAB — POCT URINALYSIS DIP (DEVICE)
Glucose, UA: NEGATIVE
Ketones, ur: NEGATIVE
Operator id: 239701
Specific Gravity, Urine: 1.01

## 2014-09-29 ENCOUNTER — Emergency Department (HOSPITAL_COMMUNITY): Payer: Medicare Other

## 2014-09-29 ENCOUNTER — Encounter (HOSPITAL_COMMUNITY): Payer: Self-pay | Admitting: *Deleted

## 2014-09-29 ENCOUNTER — Emergency Department (HOSPITAL_COMMUNITY)
Admission: EM | Admit: 2014-09-29 | Discharge: 2014-10-01 | Disposition: A | Discharge status: Home / Self Care | Payer: Medicare Other | Attending: Emergency Medicine | Admitting: Emergency Medicine

## 2014-09-29 DIAGNOSIS — Z79899 Other long term (current) drug therapy: Secondary | ICD-10-CM | POA: Insufficient documentation

## 2014-09-29 DIAGNOSIS — S92912A Unspecified fracture of left toe(s), initial encounter for closed fracture: Secondary | ICD-10-CM

## 2014-09-29 DIAGNOSIS — M79676 Pain in unspecified toe(s): Secondary | ICD-10-CM

## 2014-09-29 DIAGNOSIS — S92422A Displaced fracture of distal phalanx of left great toe, initial encounter for closed fracture: Secondary | ICD-10-CM | POA: Insufficient documentation

## 2014-09-29 DIAGNOSIS — W500XXA Accidental hit or strike by another person, initial encounter: Secondary | ICD-10-CM | POA: Insufficient documentation

## 2014-09-29 DIAGNOSIS — Y9389 Activity, other specified: Secondary | ICD-10-CM | POA: Insufficient documentation

## 2014-09-29 DIAGNOSIS — Y998 Other external cause status: Secondary | ICD-10-CM | POA: Insufficient documentation

## 2014-09-29 DIAGNOSIS — E119 Type 2 diabetes mellitus without complications: Secondary | ICD-10-CM | POA: Insufficient documentation

## 2014-09-29 DIAGNOSIS — Y9289 Other specified places as the place of occurrence of the external cause: Secondary | ICD-10-CM | POA: Insufficient documentation

## 2014-09-29 DIAGNOSIS — R454 Irritability and anger: Secondary | ICD-10-CM

## 2014-09-29 HISTORY — DX: Hallucinations, unspecified: R44.3

## 2014-09-29 HISTORY — DX: Irritability and anger: R45.4

## 2014-09-29 HISTORY — DX: Type 2 diabetes mellitus without complications: E11.9

## 2014-09-29 HISTORY — DX: Mild intellectual disabilities: F70

## 2014-09-29 HISTORY — DX: Schizophrenia, unspecified: F20.9

## 2014-09-29 LAB — COMPREHENSIVE METABOLIC PANEL
ALK PHOS: 59 U/L (ref 39–117)
ALT: 18 U/L (ref 0–35)
AST: 19 U/L (ref 0–37)
Albumin: 3.2 g/dL — ABNORMAL LOW (ref 3.5–5.2)
Anion gap: 7 (ref 5–15)
BUN: 12 mg/dL (ref 6–23)
CO2: 25 mmol/L (ref 19–32)
Calcium: 9.2 mg/dL (ref 8.4–10.5)
Chloride: 108 mmol/L (ref 96–112)
Creatinine, Ser: 0.7 mg/dL (ref 0.50–1.10)
GFR calc non Af Amer: >90 mL/min (ref >90)
Glucose, Bld: 112 mg/dL — ABNORMAL HIGH (ref 70–99)
Potassium: 4.2 mmol/L (ref 3.5–5.1)
SODIUM: 140 mmol/L (ref 135–145)
TOTAL PROTEIN: 7.3 g/dL (ref 6.0–8.3)
Total Bilirubin: 0.4 mg/dL (ref 0.3–1.2)

## 2014-09-29 LAB — CBC WITH DIFFERENTIAL/PLATELET
BASOS PCT: 0 % (ref 0–1)
Basophils Absolute: 0 10*3/uL (ref 0.0–0.1)
EOS PCT: 2 % (ref 0–5)
Eosinophils Absolute: 0.2 10*3/uL (ref 0.0–0.7)
HCT: 39.9 % (ref 36.0–46.0)
Hemoglobin: 12.6 g/dL (ref 12.0–15.0)
LYMPHS ABS: 3.3 10*3/uL (ref 0.7–4.0)
LYMPHS PCT: 22 % (ref 12–46)
MCH: 26.4 pg (ref 26.0–34.0)
MCHC: 31.6 g/dL (ref 30.0–36.0)
MCV: 83.5 fL (ref 78.0–100.0)
MONO ABS: 1 10*3/uL (ref 0.1–1.0)
Monocytes Relative: 7 % (ref 3–12)
NEUTROS PCT: 69 % (ref 43–77)
Neutro Abs: 10.2 10*3/uL — ABNORMAL HIGH (ref 1.7–7.7)
PLATELETS: 240 10*3/uL (ref 150–400)
RBC: 4.78 MIL/uL (ref 3.87–5.11)
RDW: 18.1 % — ABNORMAL HIGH (ref 11.5–15.5)
WBC: 14.8 10*3/uL — ABNORMAL HIGH (ref 4.0–10.5)

## 2014-09-29 LAB — CBG MONITORING, ED: Glucose-Capillary: 103 mg/dL — ABNORMAL HIGH (ref 70–99)

## 2014-09-29 LAB — RAPID URINE DRUG SCREEN, HOSP PERFORMED
Amphetamines: NOT DETECTED
BENZODIAZEPINES: POSITIVE — AB
Barbiturates: NOT DETECTED
Cocaine: NOT DETECTED
OPIATES: NOT DETECTED
TETRAHYDROCANNABINOL: NOT DETECTED

## 2014-09-29 LAB — ETHANOL: Alcohol, Ethyl (B): <5 mg/dL (ref 0–9)

## 2014-09-29 MED ORDER — ZIPRASIDONE MESYLATE 20 MG IM SOLR: 10.0000 mg | INTRAMUSCULAR | Status: DC | PRN

## 2014-09-29 MED ORDER — LISINOPRIL 5 MG PO TABS
2.5000 mg | ORAL_TABLET | Freq: Every day | ORAL | Status: DC
  Administered 2014-09-30 – 2014-10-01 (×2): 2.5 mg via ORAL
  Filled 2014-09-29 (×2): qty 1

## 2014-09-29 MED ORDER — METHYLPHENIDATE HCL ER (OSM) 36 MG PO TBCR: 36.0000 mg | EXTENDED_RELEASE_TABLET | Freq: Every morning | ORAL | Status: DC

## 2014-09-29 MED ORDER — PANTOPRAZOLE SODIUM 40 MG PO TBEC
40.0000 mg | DELAYED_RELEASE_TABLET | Freq: Every day | ORAL | Status: DC
Start: 2014-09-30 — End: 2014-10-01
  Administered 2014-09-30 – 2014-10-01 (×2): 40 mg via ORAL
  Filled 2014-09-29 (×2): qty 1

## 2014-09-29 MED ORDER — DIVALPROEX SODIUM 250 MG PO DR TAB
500.0000 mg | DELAYED_RELEASE_TABLET | Freq: Two times a day (BID) | ORAL | Status: DC
  Administered 2014-09-30: 1000 mg via ORAL
  Filled 2014-09-29: qty 4

## 2014-09-29 MED ORDER — INSULIN GLARGINE 100 UNIT/ML ~~LOC~~ SOLN
75.0000 [IU] | Freq: Every day | SUBCUTANEOUS | Status: DC
  Administered 2014-09-30 (×2): 75 [IU] via SUBCUTANEOUS
  Filled 2014-09-29 (×3): qty 0.75

## 2014-09-29 MED ORDER — METFORMIN HCL 500 MG PO TABS
1000.0000 mg | ORAL_TABLET | Freq: Two times a day (BID) | ORAL | Status: DC
  Administered 2014-09-30 – 2014-10-01 (×3): 1000 mg via ORAL
  Filled 2014-09-29 (×3): qty 2

## 2014-09-29 MED ORDER — ATENOLOL 25 MG PO TABS
50.0000 mg | ORAL_TABLET | Freq: Every day | ORAL | Status: DC
  Administered 2014-09-30 – 2014-10-01 (×2): 50 mg via ORAL
  Filled 2014-09-29 (×2): qty 2

## 2014-09-29 MED ORDER — ATORVASTATIN CALCIUM 40 MG PO TABS
80.0000 mg | ORAL_TABLET | Freq: Every day | ORAL | Status: DC
  Administered 2014-09-30: 80 mg via ORAL
  Filled 2014-09-29: qty 1
  Filled 2014-09-29: qty 2
  Filled 2014-09-29: qty 1

## 2014-09-29 MED ORDER — LORAZEPAM 1 MG PO TABS
1.0000 mg | ORAL_TABLET | Freq: Two times a day (BID) | ORAL | Status: DC
  Administered 2014-09-30 (×2): 1 mg via ORAL
  Administered 2014-09-30 – 2014-10-01 (×2): 2 mg via ORAL
  Filled 2014-09-29: qty 1
  Filled 2014-09-29: qty 2
  Filled 2014-09-29: qty 1
  Filled 2014-09-29: qty 2

## 2014-09-29 MED ORDER — IMIPRAMINE HCL 10 MG PO TABS
40.0000 mg | ORAL_TABLET | Freq: Every day | ORAL | Status: DC
  Administered 2014-09-30 (×2): 40 mg via ORAL
  Filled 2014-09-29 (×3): qty 4
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
## 2014-09-29 NOTE — ED Notes (Signed)
TTS consult being performed at this time.  

## 2014-09-29 NOTE — ED Notes (Signed)
Meal tray given 
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
## 2014-09-30 LAB — CBG MONITORING, ED
GLUCOSE-CAPILLARY: 95 mg/dL (ref 70–99)
Glucose-Capillary: 113 mg/dL — ABNORMAL HIGH (ref 70–99)
Glucose-Capillary: 146 mg/dL — ABNORMAL HIGH (ref 70–99)

## 2014-09-30 MED ORDER — DIVALPROEX SODIUM 250 MG PO DR TAB
1000.0000 mg | DELAYED_RELEASE_TABLET | Freq: Every day | ORAL | Status: DC
  Administered 2014-09-30: 1000 mg via ORAL
  Filled 2014-09-30: qty 4

## 2014-09-30 MED ORDER — HYDROCODONE-ACETAMINOPHEN 5-325 MG PO TABS
1.0000 | ORAL_TABLET | Freq: Once | ORAL | Status: AC
  Administered 2014-09-30: 1 via ORAL
  Filled 2014-09-30: qty 1

## 2014-09-30 MED ORDER — DIVALPROEX SODIUM 250 MG PO DR TAB
500.0000 mg | DELAYED_RELEASE_TABLET | Freq: Every day | ORAL | Status: DC
  Administered 2014-09-30 – 2014-10-01 (×2): 500 mg via ORAL
  Filled 2014-09-30 (×2): qty 2

## 2014-09-30 MED ORDER — IMIPRAMINE HCL 10 MG PO TABS
ORAL_TABLET | ORAL | Status: AC
  Filled 2014-09-30: qty 4

## 2014-09-30 NOTE — ED Notes (Signed)
Spoke with [facility] at [facility]. Informed her that the Hospital does not carry Concerta and that if the group home could bring medication to ED so we can continue her medication regimen. States she will contact her caregiver at the facility.

## 2014-09-30 NOTE — ED Notes (Signed)
[facility] left contact number [phone]. Stated to call with questions and to update him on patient's care.

## 2014-09-30 NOTE — BHH Counselor (Addendum)
Contacted the following facilities for placement:  Alexandria Harvey, [facility], confirms adult unit is currently at capacity. Contacted the following facilities for placement:  BED AVAILABLE, FAXED CLINICAL INFORMATION: Alexandria Harvey, per [facility], per [employer]  AT CAPACITY: [name], per Alexandria Harvey, per [name], per [facility], per [name], per [name], per [name], per [name], per [facility] Harvey, per [name], per [name]  [name], WisconsinLPC, [facility] Triage Specialist [phone]

## 2014-09-30 NOTE — ED Notes (Signed)
[facility] called. They do not have Concerta with patient and are awaiting a refill before they can get the medication to us.
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
## 2014-09-30 NOTE — ED Notes (Signed)
Patient informed that Lipitor is not available at the moment due to main pharmacy not being open. Patient verbalized understanding.

## 2014-09-30 NOTE — ED Notes (Signed)
Patient calm and pleasant this morning. Took 0800 medication without difficulty. Eating breakfast at present time.

## 2014-09-30 NOTE — ED Notes (Signed)
Patient resting with eyes closed. No distress. Respirations even and unlabored.

## 2014-09-30 NOTE — ED Notes (Signed)
Notified Dr. Elesa MassedWard of patient's blood glucose and that patient is ordered lantus. Dr. Elesa MassedWard stated to continue to administer lantus.

## 2014-09-30 NOTE — ED Notes (Signed)
Attempting to call [facility] to attempt to get Concerta delivered to ED for administration while patient is in department.

## 2014-09-30 NOTE — BHH Counselor (Signed)
Pt remains under consideration at [facility]. Due to Pt's intellectual disability she cannot be referred to [facility] without meeting diversion criteria and this criteria cannot be met unless [facility] and [facility] decline Pt.  Orpah Greek Rosana Hoes, [facility] Triage Specialist [phone]

## 2014-09-30 NOTE — Progress Notes (Signed)
CSW [facility].  CSW made referrals to:  Pending: [facility]  Patient needs continued inpatient placement.  Adelene AmasEdith Derryck Shahan, LCSW Disposition Social Worker [phone]
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
## 2014-09-30 NOTE — Progress Notes (Signed)
CSW spoke with patient RN.  RN reported they had patient IQ paperwork on file.  CSW requested IQ paperwork.  CSW will await receipt of paperwork and pursue inpatient [facility].     Adelene AmasEdith Kedra Mcglade, LCSW Disposition Social Worker [phone]

## 2014-10-01 DIAGNOSIS — F79 Unspecified intellectual disabilities: Secondary | ICD-10-CM

## 2014-10-01 DIAGNOSIS — R4689 Other symptoms and signs involving appearance and behavior: Secondary | ICD-10-CM

## 2014-10-01 LAB — CBG MONITORING, ED: GLUCOSE-CAPILLARY: 98 mg/dL (ref 70–99)

## 2014-10-01 NOTE — ED Notes (Addendum)
TTS machine in pts room.  

## 2014-10-01 NOTE — BH Assessment (Signed)
Notified Ellen Henrionrad Withrow,NP of Psych consult request.  Glorious PeachNajah Aldina Porta, MS, LCASA Assessment Counselor

## 2014-10-01 NOTE — ED Notes (Signed)
Nurse re-called facility, Per [name] they are on the way.

## 2014-10-01 NOTE — ED Notes (Signed)
Patient ambulated to restroom with assist, tolerated well 

## 2014-10-01 NOTE — ED Provider Notes (Signed)
08:50- daily note Patient interviewed.  She is comfortable.  She states she has not been able to sleep during the night, because of the "noise".  She is under involuntary commitment.  There does not appear to be any acute unstable psychiatric conditions, at this time.  I will ask psychiatry to evaluate her for probable discharge back to her group home  Flint MelterElliott L Aran Menning, MD 10/01/14 [phone]

## 2014-10-01 NOTE — ED Notes (Signed)
IVC resended and signed by Dr. Effie ShyWentz.
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
## 2014-10-01 NOTE — Progress Notes (Signed)
CSW received call back from [facility] who requested that the entire referral packet be resent as the IDD packet was not received. CSW refaxed information.   Chad CordialLauren Carter, LCSWA 10/01/2014 10:30 AM

## 2014-10-01 NOTE — ED Notes (Signed)
Per Fay RecordsHarold Caroline from [facility] states ride is on the way for the patient.

## 2014-10-01 NOTE — [facility] Counselor (Signed)
NP Renata Capriceonrad evaluated the Pt this am. TTS asked [facility] ED to cancel the consult issued today.  Wolfgang PhoenixBrandi Latressa Harries, [facility] Triage Specialist

## 2014-10-01 NOTE — ED Notes (Signed)
Nurse called [facility] to ensure facility would have a ride for patient at discharge.

## 2014-10-01 NOTE — Discharge Instructions (Signed)
Aggression °Physically aggressive behavior is common among small children. When frustrated or angry, toddlers may act out. Often, they will push, bite, or hit. Most children show less physical aggression as they grow up. Their language and interpersonal skills improve, too. But continued aggressive behavior is a sign of a problem. This behavior can lead to aggression and delinquency in adolescence and adulthood. °Aggressive behavior can be psychological or physical. Forms of psychological aggression include threatening or bullying others. Forms of physical aggression include:  °· Pushing. °· Hitting. °· Slapping. °· Kicking. °· Stabbing. °· Shooting. °· Raping.  °PREVENTION  °Encouraging the following behaviors can help manage aggression: °· Respecting others and valuing differences. °· Participating in school and community functions, including sports, music, after-school programs, community groups, and volunteer work. °· Talking with an adult when they are sad, depressed, fearful, anxious, or angry. Discussions with a parent or other family member, counselor, teacher, or coach can help. °· Avoiding alcohol and drug use. °· Dealing with disagreements without aggression, such as conflict resolution. To learn this, children need parents and caregivers to model respectful communication and problem solving. °· Limiting exposure to aggression and violence, such as video games that are not age appropriate, violence in the media, or domestic violence. °Document Released: 06/21/2007 Document Revised: 11/16/2011 Document Reviewed: 10/30/2010 °Exit[employer]® Patient Information ©2015 [employer]. This information is not intended to replace advice given to you by your health care provider. Make sure you discuss any questions you have with your health care provider. ° °

## 2014-10-01 NOTE — Progress Notes (Signed)
CSW left message [facility] behavioral health triage line [facility] attempting to follow-up on the receipt of Pt's IDD documentation.  Awaiting a return call.  Chad CordialLauren Carter, LCSWA 10/01/2014 9:32 AM

## 2014-10-05 ENCOUNTER — Encounter (HOSPITAL_COMMUNITY): Payer: Self-pay | Admitting: Emergency Medicine

## 2014-10-05 ENCOUNTER — Emergency Department (HOSPITAL_COMMUNITY)
Admission: EM | Admit: 2014-10-05 | Discharge: 2014-10-06 | Disposition: A | Discharge status: Home / Self Care | Payer: Medicare Other | Attending: Emergency Medicine | Admitting: Emergency Medicine

## 2014-10-05 DIAGNOSIS — R45851 Suicidal ideations: Secondary | ICD-10-CM

## 2014-10-05 DIAGNOSIS — R441 Visual hallucinations: Secondary | ICD-10-CM

## 2014-10-05 DIAGNOSIS — Z794 Long term (current) use of insulin: Secondary | ICD-10-CM | POA: Insufficient documentation

## 2014-10-05 DIAGNOSIS — F1310 Sedative, hypnotic or anxiolytic abuse, uncomplicated: Secondary | ICD-10-CM | POA: Insufficient documentation

## 2014-10-05 DIAGNOSIS — F70 Mild intellectual disabilities: Secondary | ICD-10-CM | POA: Insufficient documentation

## 2014-10-05 DIAGNOSIS — Z3202 Encounter for pregnancy test, result negative: Secondary | ICD-10-CM | POA: Insufficient documentation

## 2014-10-05 DIAGNOSIS — E119 Type 2 diabetes mellitus without complications: Secondary | ICD-10-CM | POA: Insufficient documentation

## 2014-10-05 DIAGNOSIS — Z79899 Other long term (current) drug therapy: Secondary | ICD-10-CM | POA: Insufficient documentation

## 2014-10-05 DIAGNOSIS — F259 Schizoaffective disorder, unspecified: Secondary | ICD-10-CM

## 2014-10-05 LAB — BASIC METABOLIC PANEL
Anion gap: 5 (ref 5–15)
BUN: 11 mg/dL (ref 6–23)
CO2: 24 mmol/L (ref 19–32)
Calcium: 8.5 mg/dL (ref 8.4–10.5)
Chloride: 109 mmol/L (ref 96–112)
Creatinine, Ser: 0.5 mg/dL (ref 0.50–1.10)
GFR calc Af Amer: >90 mL/min (ref >90)
Glucose, Bld: 70 mg/dL (ref 70–99)
Potassium: 3.9 mmol/L (ref 3.5–5.1)
SODIUM: 138 mmol/L (ref 135–145)

## 2014-10-05 LAB — CBC WITH DIFFERENTIAL/PLATELET
Basophils Absolute: 0 10*3/uL (ref 0.0–0.1)
Basophils Relative: 0 % (ref 0–1)
EOS ABS: 0.2 10*3/uL (ref 0.0–0.7)
Eosinophils Relative: 1 % (ref 0–5)
HCT: 38.6 % (ref 36.0–46.0)
Hemoglobin: 12.1 g/dL (ref 12.0–15.0)
LYMPHS ABS: 5 10*3/uL — AB (ref 0.7–4.0)
Lymphocytes Relative: 34 % (ref 12–46)
MCH: 26.9 pg (ref 26.0–34.0)
MCHC: 31.3 g/dL (ref 30.0–36.0)
MCV: 86 fL (ref 78.0–100.0)
Monocytes Absolute: 0.8 10*3/uL (ref 0.1–1.0)
Monocytes Relative: 6 % (ref 3–12)
Neutro Abs: 8.6 10*3/uL — ABNORMAL HIGH (ref 1.7–7.7)
Neutrophils Relative %: 59 % (ref 43–77)
Platelets: 244 10*3/uL (ref 150–400)
RBC: 4.49 MIL/uL (ref 3.87–5.11)
RDW: 17.6 % — ABNORMAL HIGH (ref 11.5–15.5)
WBC: 14.7 10*3/uL — ABNORMAL HIGH (ref 4.0–10.5)

## 2014-10-05 LAB — RAPID URINE DRUG SCREEN, HOSP PERFORMED
AMPHETAMINES: NOT DETECTED
BARBITURATES: NOT DETECTED
Benzodiazepines: POSITIVE — AB
Cocaine: NOT DETECTED
Opiates: NOT DETECTED
Tetrahydrocannabinol: NOT DETECTED

## 2014-10-05 LAB — CBG MONITORING, ED: GLUCOSE-CAPILLARY: 56 mg/dL — AB (ref 70–99)

## 2014-10-05 LAB — PREGNANCY, URINE: PREG TEST UR: NEGATIVE

## 2014-10-05 LAB — VALPROIC ACID LEVEL: VALPROIC ACID LVL: 36.5 ug/mL — AB (ref 50.0–100.0)

## 2014-10-05 LAB — ETHANOL: Alcohol, Ethyl (B): <5 mg/dL (ref 0–9)

## 2014-10-05 MED ORDER — DEXTROSE 50 % IV SOLN
INTRAVENOUS | Status: AC
  Filled 2014-10-05: qty 50

## 2014-10-05 MED ORDER — DIVALPROEX SODIUM 250 MG PO DR TAB
500.0000 mg | DELAYED_RELEASE_TABLET | Freq: Two times a day (BID) | ORAL | Status: DC
  Administered 2014-10-06: 500 mg via ORAL
  Filled 2014-10-05 (×2): qty 2

## 2014-10-05 MED ORDER — INSULIN LISPRO 100 UNIT/ML ~~LOC~~ SOLN: 20.0000 [IU] | Freq: Three times a day (TID) | SUBCUTANEOUS | Status: DC

## 2014-10-05 MED ORDER — IPRATROPIUM BROMIDE 0.03 % NA SOLN
1.0000 | Freq: Two times a day (BID) | NASAL | Status: DC
  Administered 2014-10-06: 1 via NASAL
  Filled 2014-10-05: qty 30

## 2014-10-05 MED ORDER — INSULIN ASPART 100 UNIT/ML ~~LOC~~ SOLN: 40.0000 [IU] | Freq: Two times a day (BID) | SUBCUTANEOUS | Status: DC

## 2014-10-05 MED ORDER — METHYLPHENIDATE HCL ER (OSM) 36 MG PO TBCR: 36.0000 mg | EXTENDED_RELEASE_TABLET | Freq: Every morning | ORAL | Status: DC

## 2014-10-05 MED ORDER — LORAZEPAM 1 MG PO TABS
1.0000 mg | ORAL_TABLET | Freq: Two times a day (BID) | ORAL | Status: DC
  Administered 2014-10-06: 1 mg via ORAL
  Filled 2014-10-05 (×2): qty 1

## 2014-10-05 MED ORDER — INSULIN ASPART 100 UNIT/ML ~~LOC~~ SOLN
20.0000 [IU] | Freq: Every day | SUBCUTANEOUS | Status: DC
  Administered 2014-10-06: 20 [IU] via SUBCUTANEOUS
  Filled 2014-10-05: qty 1

## 2014-10-05 MED ORDER — INSULIN GLARGINE 100 UNIT/ML ~~LOC~~ SOLN
75.0000 [IU] | Freq: Every day | SUBCUTANEOUS | Status: DC
  Filled 2014-10-05 (×3): qty 0.75

## 2014-10-05 MED ORDER — IMIPRAMINE HCL 10 MG PO TABS
40.0000 mg | ORAL_TABLET | Freq: Every day | ORAL | Status: DC
  Filled 2014-10-05 (×3): qty 4

## 2014-10-05 MED ORDER — LISINOPRIL 5 MG PO TABS
2.5000 mg | ORAL_TABLET | Freq: Every day | ORAL | Status: DC
  Administered 2014-10-06: 2.5 mg via ORAL
  Filled 2014-10-05: qty 1

## 2014-10-05 MED ORDER — DEXTROSE 50 % IV SOLN: 50.0000 mL | Freq: Once | INTRAVENOUS | Status: DC

## 2014-10-05 MED ORDER — IMIPRAMINE HCL 10 MG PO TABS
ORAL_TABLET | ORAL | Status: AC
  Filled 2014-10-05: qty 4

## 2014-10-05 MED ORDER — DOCUSATE SODIUM 100 MG PO CAPS
100.0000 mg | ORAL_CAPSULE | Freq: Two times a day (BID) | ORAL | Status: DC
  Administered 2014-10-06: 100 mg via ORAL
  Filled 2014-10-05 (×6): qty 1

## 2014-10-05 MED ORDER — ATORVASTATIN CALCIUM 40 MG PO TABS
80.0000 mg | ORAL_TABLET | Freq: Every day | ORAL | Status: DC
  Filled 2014-10-05 (×3): qty 2

## 2014-10-05 MED ORDER — PANTOPRAZOLE SODIUM 40 MG PO TBEC
80.0000 mg | DELAYED_RELEASE_TABLET | Freq: Every day | ORAL | Status: DC
  Administered 2014-10-06: 80 mg via ORAL
  Filled 2014-10-05 (×2): qty 2

## 2014-10-05 MED ORDER — METFORMIN HCL 500 MG PO TABS
1000.0000 mg | ORAL_TABLET | Freq: Two times a day (BID) | ORAL | Status: DC
  Administered 2014-10-06: 1000 mg via ORAL
  Filled 2014-10-05: qty 2

## 2014-10-05 MED ORDER — ATENOLOL 25 MG PO TABS
50.0000 mg | ORAL_TABLET | Freq: Every day | ORAL | Status: DC
  Administered 2014-10-06: 50 mg via ORAL
  Filled 2014-10-05 (×2): qty 2

## 2014-10-05 MED ORDER — GLUCAGON HCL RDNA (DIAGNOSTIC) 1 MG IJ SOLR
1.0000 mg | Freq: Once | INTRAMUSCULAR | Status: AC | PRN
  Administered 2014-10-05: 1 mg via INTRAMUSCULAR
  Filled 2014-10-05: qty 1

## 2014-10-05 NOTE — ED Notes (Signed)
Attempted IV. Pt violent swinging arms and legs at staff. IVC papers in progress.

## 2014-10-05 NOTE — ED Notes (Signed)
Pt remains fully alert and resistant to care.
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
## 2014-10-05 NOTE — ED Notes (Signed)
Pt refusing to eat/drink anything states"Its my right to die if I want to die" informed pt that she had the choice of eating/drinking and offered her a variety of choices. Pt refused all. The alternative of IV was approached and pt is refusing that. Dr Clarene DukeMcmanus notified.

## 2014-10-06 ENCOUNTER — Encounter (HOSPITAL_COMMUNITY): Payer: Self-pay

## 2014-10-06 DIAGNOSIS — F259 Schizoaffective disorder, unspecified: Secondary | ICD-10-CM

## 2014-10-06 DIAGNOSIS — R45851 Suicidal ideations: Secondary | ICD-10-CM

## 2014-10-06 LAB — CBG MONITORING, ED
GLUCOSE-CAPILLARY: 65 mg/dL — AB (ref 70–99)
GLUCOSE-CAPILLARY: 126 mg/dL — AB (ref 70–99)
Glucose-Capillary: 110 mg/dL — ABNORMAL HIGH (ref 70–99)
Glucose-Capillary: 126 mg/dL — ABNORMAL HIGH (ref 70–99)
Glucose-Capillary: 145 mg/dL — ABNORMAL HIGH (ref 70–99)

## 2014-10-06 MED ORDER — IPRATROPIUM BROMIDE 0.06 % NA SOLN
1.0000 | Freq: Two times a day (BID) | NASAL | Status: DC
  Filled 2014-10-06: qty 15

## 2014-10-06 NOTE — BH Assessment (Signed)
[facility] Assessment Progress Note Spoke with Dr. Manus Gunningancour to inform him about Jamison's disposition, and he will rescind IVC and d/c to group home. Also spoke with Mr. Laurian BrimCarelock, gp home administrator who understands situation and agrees to take pt back at this time.

## 2014-10-06 NOTE — ED Notes (Signed)
After making multiple phone calls, i was able to reach Mr. Alexandria Harvey with [facility]. I updated him on pt discharge status. He stated he would arange transport and call back with details.  Dr. Manus Gunningancour completed the forms to d/c IVC order and it was faxed. CBG and vitals being completed now.

## 2014-10-06 NOTE — ED Notes (Signed)
Pt ate eggs and drank orange juice and a sprite for breakfast. Pt asked me not to take her tray as of yet.
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
## 2014-10-06 NOTE — ED Notes (Signed)
Pt escorted to the shower by security after eating all of his dinner.

## 2014-10-06 NOTE — ED Notes (Signed)
Per [facility] psychotherapistsocial worker at [facility], [facility] at [facility] is not to be in patient's presence when blood glucose is obtained.

## 2014-10-06 NOTE — ED Provider Notes (Signed)
Psychiatry has cleared patient to return to group home. She is eating meals and her blood sugar is stable. She denies any suicidal or homicidal thoughts. IVC rescinded per psychiatry recommendations  BP 126/76 mmHg  Pulse 100  Temp(Src) 98.4 F (36.9 C) (Oral)  Resp 16  Ht 5\' 2"  (1.575 m)  Wt 185 lb (83.915 kg)  BMI 33.83 kg/m2  SpO2 99%   Glynn OctaveStephen Haley Roza, MD 10/06/14 2027

## 2014-10-06 NOTE — ED Notes (Signed)
Pt ate 90% of meal. Will resume diabetic medications before next meal. Pt has been cooperative during this shift.

## 2014-10-06 NOTE — Discharge Instructions (Signed)
Schizoaffective Disorder Take your medications as prescribed and follow up with your doctor. Return to the ED if you develop new or worsening symptoms. Schizoaffective disorder (ScAD) is a mental illness. It causes symptoms that are a mixture of schizophrenia (a psychotic disorder) and an affective (mood) disorder. The schizophrenic symptoms may include delusions, hallucinations, or odd behavior. The mood symptoms may be similar to major depression or bipolar disorder. ScAD may interfere with personal relationships or normal daily activities. People with ScAD are at increased risk for job loss, social isolation,physical health problems, anxiety and substance use disorders, and suicide. ScAD usually occurs in cycles. Periods of severe symptoms are followed by periods of less severe symptoms or improvement. The illness affects men and women equally but usually appears at an earlier age (teenage or early adult years) in men. People who have family members with schizophrenia, bipolar disorder, or ScAD are at higher risk of developing ScAD. SYMPTOMS  At any one time, people with ScAD may have psychotic symptoms only or both psychotic and mood symptoms. The psychotic symptoms include one or more of the following:  Hearing, seeing, or feeling things that are not there (hallucinations).   Having fixed, false beliefs (delusions). The delusions usually are of being attacked, harassed, cheated, persecuted, or conspired against (paranoid delusions).  Speaking in a way that makes no sense to others (disorganized speech). The psychotic symptoms of ScAD may also include confusing or odd behavior or any of the negative symptoms of schizophrenia. These include loss of motivation for normal daily activities, such as bathing or grooming, withdrawal from other people, and lack of emotions.  The mood symptoms of ScAD occur more often than not. They resemble major depressive disorder or bipolar mania. Symptoms of major  depression include depressed mood and four or more of the following:  Loss of interest in usually pleasurable activities (anhedonia).  Sleeping more or less than normal.  Feeling worthless or excessively guilty.  Lack of energy or motivation.  Trouble concentrating.  Eating more or less than usual.  Thinking a lot about death or suicide. Symptoms of bipolar mania include abnormally elevated or irritable mood and increased energy or activity, plus three or more of the following:   More confidence than normal or feeling that you are able to do anything (grandiosity).  Feeling rested with less sleep than normal.   Being easily distracted.   Talking more than usual or feeling pressured to keep talking.   Feeling that your thoughts are racing.  Engaging in high-risk activities such as buying sprees or foolish business decisions. DIAGNOSIS  ScAD is diagnosed through an assessment by your health care provider. Your health care provider will observe and ask questions about your thoughts, behavior, mood, and ability to function in daily life. Your health care provider may also ask questions about your medical history and use of drugs, including prescription medicines. Your health care provider may also order blood tests and imaging exams. Certain medical conditions and substances can cause symptoms that resemble ScAD. Your health care provider may refer you to a mental health specialist for evaluation.  ScAD is divided into two types. The depressive type is diagnosed if your mood symptoms are limited to major depression. The bipolar type is diagnosed if your mood symptoms are manic or a mixture of manic and depressive symptoms TREATMENT  ScAD is usually a lifelong illness. Long-term treatment is necessary. The following treatments are available:  Medicine. Different types of medicine are used to treat ScAD. The  exact combination depends on the type and severity of your symptoms.  Antipsychotic medicine is used to control psychotic symptomssuch as delusions, paranoia, and hallucinations. Mood stabilizers can even the highs and lows of bipolar manic mood swings. Antidepressant medicines are used to treat major depressive symptoms.  Counseling or talk therapy. Individual, group, or family counseling may be helpful in providing education, support, and guidance. Many people with ScAD also benefit from social skills and job skills (vocational) training. A combination of medicine and counseling is usually best for managing the disorder over time. A procedure in which electricity is applied to the brain through the scalp (electroconvulsive therapy) may be used to treat people with severe manic symptoms that do not respond to medicine and counseling. HOME CARE INSTRUCTIONS   Take all your medicine as prescribed.  Check with your health care provider before starting new prescription or over-the-counter medicines.  Keep all follow up appointments with your health care provider. SEEK MEDICAL CARE IF:   If you are not able to take your medicines as prescribed.  If your symptoms get worse. SEEK IMMEDIATE MEDICAL CARE IF:   You have serious thoughts about hurting yourself or others. Document Released: 01/04/2007 Document Revised: 01/08/2014 Document Reviewed: 04/07/2013 [facility] Patient Information 2015 [place], [place]. This information is not intended to replace advice given to you by your health care provider. Make sure you discuss any questions you have with your health care provider.

## 2014-10-06 NOTE — BH Assessment (Signed)
Received notification of TTS consult. Spoke with Dr. Samuel JesterKathleen McManus who said Pt states she doesn't want to live anymore and doesn't want to stay at the group home. Pt has been uncooperative in ED and has been placed under IVC. Tele-assessment will be attempted.  Harlin RainFord Ellis Ria CommentWarrick Jr, LPC, [facility] Triage Specialist [phone]

## 2014-10-06 NOTE — ED Notes (Signed)
Calm when staff not trying to interact with her. Allowed CBG w/o incident

## 2014-10-06 NOTE — ED Notes (Signed)
Metformin held this morning due to pt refusing to eat. EDP aware.

## 2014-10-06 NOTE — ED Notes (Signed)
Pt ate ~50% of meal. BH just called and stated that pt may be d/c back to group home. BH stated group home is aware that she will be coming back.

## 2014-10-06 NOTE — ED Notes (Signed)
Discharge instructions given, pt caregiver demonstrated teach back and verbal understanding. No concerns voiced.  

## 2014-10-06 NOTE — ED Notes (Signed)
Spoke with nurse at [facility] and explained that [facility] does not carry Concerta and we will need her supply from there in order for her to receive it here. Medication to be stored in pharmacy when it arrives. Will make an additional note if it arrives during this shift.
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
## 2014-10-06 NOTE — ED Notes (Signed)
Per Emily at [facility] pt is on list to be re-evaluated by extender.  

## 2014-10-06 NOTE — BH Assessment (Signed)
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  Spoke with Mr. Laurian BrimCarelock, gp home administrator, who is concerned that pr has been in the ED 2x in the past 2 weeks, and has only been in the gp home since November.  He is concerned that this level of care may not be appropriate for pt since she has been hospitalized for 3 years in the past.  Writer discussed the fact that pt has stabilized in the ED and would not meet criteria for a diversion she would need to be hospitalized.  NP will see pt to determine if she meets criteria.

## 2014-10-06 NOTE — [facility] Suicide Risk Assessment (Signed)
Suicide Risk Assessment  Discharge Assessment   [facility] Dis[facility]   Demographic Factors:  Adolescent or young adult  Total Time spent with patient: 45 minutes  Musculoskeletal: Strength & Muscle Tone: within normal limits Gait & Station: normal Patient leans: N/A  Psychiatric Specialty Exam:     Blood pressure 127/76, pulse 92, temperature 98.1 F (36.7 C), temperature source Oral, resp. rate 14, height 5\' 2"  (1.575 m), weight 185 lb (83.915 kg), SpO2 100 %.Body mass index is 33.83 kg/(m^2).  General Appearance: Casual  Eye Contact::  Good  Speech:  Normal Rate  Volume:  Normal  Mood:  Euthymic  Affect:  Flat  Thought Process:  Coherent  Orientation:  Full (Time, Place, and Person)  Thought Content:  WDL  Suicidal Thoughts:  No  Homicidal Thoughts:  No  Memory:  Immediate;   Good Recent;   Good Remote;   Good  Judgement:  Fair  Insight:  Fair  Psychomotor Activity:  Normal  Concentration:  Good  Recall:  Good  Fund of Knowledge: Fair  Language: Good  Akathisia:  No  Handed:  Right  AIMS (if indicated):     Assets:  Health and safety inspectorinancial Resources/Insurance Housing Leisure Time Physical Health Resilience Social Support Transportation  ADL's:  Intact  Cognition: WNL  Sleep:         Has this patient used any form of tobacco in the last 30 days? (Cigarettes, Smokeless Tobacco, Cigars, and/or Pipes) No  Mental Status Per Nursing Assessment::   On Admission:   suicidal ideations  Current Mental Status by Physician: NA  Loss Factors: NA  Historical Factors: Impulsivity  Risk Reduction Factors:   Sense of responsibility to family, Living with another person, especially a relative, Positive social support and Positive therapeutic relationship  Continued Clinical Symptoms:  None  Cognitive Features That Contribute To Risk:  None    Suicide Risk:  Minimal: No identifiable suicidal ideation.  Patients presenting with no risk factors but with  morbid ruminations; may be classified as minimal risk based on the severity of the depressive symptoms  Principal Problem: <principal problem not specified> Discharge Diagnoses:  Patient Active Problem List   Diagnosis Date Noted  . Schizoaffective disorder [F25.9] 10/06/2014    Priority: High  . Suicidal ideations [R45.851] 10/06/2014    Priority: High  . Mental retardation [F79]   . EAR PAIN, RIGHT [H92.09] 11/27/2010  . DYSURIA [R30.0] 07/29/2010  . CALLUS, FOOT [L84] 04/28/2010  . BACTERIAL VAGINITIS [N76.0] 01/20/2010  . ALLERGIC RHINITIS [J30.9] 12/19/2009  . VAGINITIS, CANDIDAL [B37.3] 10/28/2009  . PRURITUS, VAGINAL [L29.3] 10/28/2009  . DYSPEPSIA [K30] 09/26/2009  . CONSTIPATION [K59.00] 09/26/2009  . TINEA PEDIS [B35.3] 12/17/2008  . 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 DIABETES MELLITUS, TYPE II [E11.9] 01/08/2007  . HYPERLIPIDEMIA [E78.5] 01/08/2007  . 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 MENTAL RETARDATION [F79] 01/08/2007  . VISUAL HALLUCINATION [H53.16] 01/08/2007  . HYPERTENSION [I10] 01/08/2007  . SEIZURE DISORDER [R56.9] 01/08/2007  . Aggressive behavior of adult [F60.89] 01/08/2007      Plan Of Care/Follow-up recommendations:  Activity:  as tolerated Diet:  heart healthy diet  Is patient on multiple antipsychotic therapies at discharge:  No   Has Patient had three or more failed trials of antipsychotic monotherapy by history:  No  Recommended Plan for Multiple Antipsychotic Therapies: NA    LORD, JAMISON, PMH-NP 10/06/2014, 4:28 PM
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
## 2014-10-08 LAB — CBG MONITORING, ED: Glucose-Capillary: 119 mg/dL — ABNORMAL HIGH (ref 70–99)

## 2014-10-13 ENCOUNTER — Emergency Department (HOSPITAL_COMMUNITY): Payer: Medicare Other

## 2014-10-13 ENCOUNTER — Emergency Department (HOSPITAL_COMMUNITY)
Admission: EM | Admit: 2014-10-13 | Discharge: 2014-10-13 | Disposition: A | Discharge status: Home / Self Care | Payer: Medicare Other | Attending: Emergency Medicine | Admitting: Emergency Medicine

## 2014-10-13 ENCOUNTER — Encounter (HOSPITAL_COMMUNITY): Payer: Self-pay | Admitting: Emergency Medicine

## 2014-10-13 DIAGNOSIS — Z79899 Other long term (current) drug therapy: Secondary | ICD-10-CM | POA: Insufficient documentation

## 2014-10-13 DIAGNOSIS — Y92009 Unspecified place in unspecified non-institutional (private) residence as the place of occurrence of the external cause: Secondary | ICD-10-CM | POA: Insufficient documentation

## 2014-10-13 DIAGNOSIS — W19XXXA Unspecified fall, initial encounter: Secondary | ICD-10-CM

## 2014-10-13 DIAGNOSIS — Z794 Long term (current) use of insulin: Secondary | ICD-10-CM | POA: Insufficient documentation

## 2014-10-13 DIAGNOSIS — I951 Orthostatic hypotension: Secondary | ICD-10-CM | POA: Insufficient documentation

## 2014-10-13 DIAGNOSIS — Z8659 Personal history of other mental and behavioral disorders: Secondary | ICD-10-CM | POA: Insufficient documentation

## 2014-10-13 DIAGNOSIS — Z043 Encounter for examination and observation following other accident: Secondary | ICD-10-CM | POA: Insufficient documentation

## 2014-10-13 DIAGNOSIS — E119 Type 2 diabetes mellitus without complications: Secondary | ICD-10-CM | POA: Insufficient documentation

## 2014-10-13 DIAGNOSIS — Y9389 Activity, other specified: Secondary | ICD-10-CM | POA: Insufficient documentation

## 2014-10-13 DIAGNOSIS — W1830XA Fall on same level, unspecified, initial encounter: Secondary | ICD-10-CM | POA: Insufficient documentation

## 2014-10-13 DIAGNOSIS — Y998 Other external cause status: Secondary | ICD-10-CM | POA: Insufficient documentation

## 2014-10-13 LAB — URINALYSIS, ROUTINE W REFLEX MICROSCOPIC
Bilirubin Urine: NEGATIVE
Glucose, UA: NEGATIVE mg/dL
Hgb urine dipstick: NEGATIVE
Ketones, ur: 15 mg/dL — AB
LEUKOCYTES UA: NEGATIVE
NITRITE: NEGATIVE
Protein, ur: NEGATIVE mg/dL
SPECIFIC GRAVITY, URINE: 1.015 (ref 1.005–1.030)
Urobilinogen, UA: 0.2 mg/dL (ref 0.0–1.0)
pH: 6 (ref 5.0–8.0)

## 2014-10-13 LAB — CBC WITH DIFFERENTIAL/PLATELET
BASOS ABS: 0 10*3/uL (ref 0.0–0.1)
Basophils Relative: 0 % (ref 0–1)
EOS ABS: 0.2 10*3/uL (ref 0.0–0.7)
EOS PCT: 2 % (ref 0–5)
HCT: 44.3 % (ref 36.0–46.0)
HEMOGLOBIN: 13.8 g/dL (ref 12.0–15.0)
Lymphocytes Relative: 32 % (ref 12–46)
Lymphs Abs: 4.5 10*3/uL — ABNORMAL HIGH (ref 0.7–4.0)
MCH: 27 pg (ref 26.0–34.0)
MCHC: 31.2 g/dL (ref 30.0–36.0)
MCV: 86.7 fL (ref 78.0–100.0)
MONOS PCT: 7 % (ref 3–12)
Monocytes Absolute: 1 10*3/uL (ref 0.1–1.0)
Neutro Abs: 8.1 10*3/uL — ABNORMAL HIGH (ref 1.7–7.7)
Neutrophils Relative %: 59 % (ref 43–77)
Platelets: 215 10*3/uL (ref 150–400)
RBC: 5.11 MIL/uL (ref 3.87–5.11)
RDW: 17.8 % — ABNORMAL HIGH (ref 11.5–15.5)
WBC: 13.8 10*3/uL — AB (ref 4.0–10.5)

## 2014-10-13 LAB — COMPREHENSIVE METABOLIC PANEL
ALT: 19 U/L (ref 0–35)
ANION GAP: 8 (ref 5–15)
AST: 22 U/L (ref 0–37)
Albumin: 3.3 g/dL — ABNORMAL LOW (ref 3.5–5.2)
Alkaline Phosphatase: 58 U/L (ref 39–117)
BUN: 10 mg/dL (ref 6–23)
CALCIUM: 9.2 mg/dL (ref 8.4–10.5)
CHLORIDE: 108 mmol/L (ref 96–112)
CO2: 24 mmol/L (ref 19–32)
Creatinine, Ser: 0.69 mg/dL (ref 0.50–1.10)
GFR calc non Af Amer: >90 mL/min (ref >90)
Glucose, Bld: 113 mg/dL — ABNORMAL HIGH (ref 70–99)
Potassium: 4.5 mmol/L (ref 3.5–5.1)
Sodium: 140 mmol/L (ref 135–145)
TOTAL PROTEIN: 7.9 g/dL (ref 6.0–8.3)
Total Bilirubin: 0.4 mg/dL (ref 0.3–1.2)

## 2014-10-13 LAB — LIPASE, BLOOD: Lipase: 28 U/L (ref 11–59)

## 2014-10-13 LAB — PREGNANCY, URINE: Preg Test, Ur: NEGATIVE

## 2014-10-13 MED ORDER — SODIUM CHLORIDE 0.9 % IV BOLUS (SEPSIS)
1000.0000 mL | Freq: Once | INTRAVENOUS | Status: AC
  Administered 2014-10-13: 1000 mL via INTRAVENOUS

## 2014-10-13 NOTE — Discharge Instructions (Signed)
°Emergency Department Resource Guide °1) Find a Doctor and Pay Out of Pocket °Although you won't have to find out who is covered by your insurance plan, it is a good idea to ask around and get recommendations. You will then need to call the office and see if the doctor you have chosen will accept you as a new patient and what types of options they offer for patients who are self-pay. Some doctors offer discounts or will set up payment plans for their patients who do not have insurance, but you will need to ask so you aren't surprised when you get to your appointment. ° °2) Contact Your Local Health Department °Not all health departments have doctors that can see patients for sick visits, but many do, so it is worth a call to see if yours does. If you don't know where your local health department is, you can check in your phone book. The CDC also has a tool to help you locate your state's health department, and many state websites also have listings of all of their local health departments. ° °3) Find a Walk-in Clinic °If your illness is not likely to be very severe or complicated, you may want to try a walk in clinic. These are popping up all over the country in pharmacies, drugstores, and shopping centers. They're usually staffed by nurse practitioners or physician assistants that have been trained to treat common illnesses and complaints. They're usually fairly quick and inexpensive. However, if you have serious medical issues or chronic medical problems, these are probably not your best option. ° °No Primary Care Doctor: °- Call [facility] at  832-8000 - they can help you locate a primary care doctor that  accepts your insurance, provides certain services, etc. °- Physician Referral Service- 1-800-533-3463 ° °Chronic Pain Problems: °Organization         Address  Phone   Notes  °[facility]  (336) 297-2271 Patients need to be referred by their primary care doctor.  ° °Medication  Assistance: °Organization         Address  Phone   Notes  °[place] [facility] [address] °[place], [place] [address] (336) 641-8030 --Must be a resident of [place] °-- Must have NO insurance coverage whatsoever (no Medicaid/ Medicare, etc.) °-- The pt. MUST have a primary care doctor that directs their care regularly and follows them in the community °  °[facility]  (866) 331-1348   °[employer]  (888) 892-1162   ° °Agencies that provide inexpensive medical care: °Organization         Address  Phone   Notes  °[facility]  (336) 832-8035   °[facility]    (336) 832-7272   °[facility] [address] °[place], [place] [address] (336) 832-4777   °[facility] [address], °[place] (336) 271-4999   °[facility]    (336) 373-0678   °[facility]    (336) 272-1050   °[facility] ° [address], [place] Phone:  (336) 832-4444, Fax:  (336) 832-4440 Hours of Operation:  9 am - 6 pm, M-F.  Also accepts Medicaid/Medicare and self-pay.  °[facility] ° [address],  Phone: (336) 832-3150, Fax: (336) 832-3151. Hours of Operation:  8:30 am - 5:30 pm, M-F.  Also accepts Medicaid and self-pay.  °[facility] [place] [address], [place] Phone: (336) 878-6027   °[facility] [address], [place], [place] (336)723-1848, Ext. 123 Mondays & Thursdays: 7-9 AM.  First 15 patients are seen on a first come, first serve basis. °  ° °Medicaid-accepting [place] Providers: ° °Organization         Address  Phone   Notes  °[facility] [address], [place] (336) 641-2100 Also accepts self-pay patients.  °[facility] [address], [place] ° (336) 856-9996   °[facility] [address], [place]  (336) 288-8857   °[facility] [address] [place] [address], [place] (336) 299-7000   °Veita Bland [address], [place]  ° (336) 373-1557 Only accepts [place] Access Medicaid patients after they have their name applied to their card.  ° °Self-Pay (no insurance) in [place]: ° °Organization         Address  Phone   Notes  °Sickle Cell Patients, [facility] [address], [place] (336) 832-1970   °[facility] [address], [place] (336) 832-4400   °[facility] ° [address], [place] (336) 992-4800   °[facility]/Dr. Osei-Bonsu ° [address] [place] [address], [place] or [address], [place] (336) [phone] Phone number for both [place] and [place] locations is the same.  °[facility] [address], [place] (336) 299-0000   °[facility] [address] [place] [address], [place] or [address] (336) 852-7530 °(336) 878-2260   °[facility] [address], [place] (336) 350-1642, phone; (336) 294-5005, fax Sees patients 1st and 3rd Saturday of every month.  Must not qualify for public or private insurance (i.e. Medicaid, Medicare, [place] Health Choice, Veterans' Benefits) • Household income should be no more than 200% of the poverty level •The clinic cannot treat you if you are pregnant or think you are pregnant • Sexually transmitted diseases are not treated at the clinic.  ° ° °Dental Care: °Organization         Address  Phone  Notes  °[place] [facility] [address], [place] (336) 641-6152 Accepts children up to age 21 who are enrolled in Medicaid or [place] Health Choice; pregnant women with a Medicaid card; and children who have applied for Medicaid or [place] Health Choice, but were declined, whose parents can pay a reduced fee at time of service.  °[place]  [facility] [place]  [address], [place] (336) 641-7733 Accepts children up to age 21 who are enrolled in Medicaid or [place] Health Choice; pregnant women with a Medicaid card; and children who have applied for Medicaid or [place] Health Choice, but were declined, whose parents can pay a reduced fee at time of service.  °[facility] ° [address], [place] (336) 641-4533 Patients are seen by appointment only. Walk-ins are not accepted. [facility] will see patients 18 years of age and older. °Monday - Tuesday (8am-5pm) °Most Wednesdays (8:30-5pm) °$30 per visit, cash only  °[facility] ° [address], [place] (336) 641-4533 Patients are seen by appointment only. Walk-ins are not accepted. [facility] will see patients 18 years of age and older. °One   Wednesday Evening (Monthly: Volunteer Based).  $30 per visit, cash only  °[facility]  (919) 537-3737 for adults; Children under age 4, call Graduate Pediatric Dentistry at ([phone]) [phone]. Children aged 4-14, please call (919) [phone] to request a pediatric application. ° Dental services are provided in all areas of dental care including fillings, crowns and bridges, complete and partial dentures, implants, gum treatment, root canals, and extractions. Preventive care is also provided. Treatment is provided to both adults and children. °Patients are selected via a lottery and there is often a waiting list. °  °[facility] [address], °[place] ° (336) 763-8833 [email or web address] °  °[facility] [address], [place], [place] (336)723-1848, Ext. 123 Second and Fourth Thursday of each month, opens at 6:30 AM; Clinic ends at 9 AM.  Patients are seen on a first-come first-served basis, and a limited number are seen during each clinic.  ° °[facility] ° [address], [place], [place] (336) 723-7904    Eligibility Requirements °You must have lived in [place], [place], or [place] counties for at least the last three months. °  You cannot be eligible for state or federal sponsored healthcare insurance, including Veterans Administration, Medicaid, or Medicare. °  You generally cannot be eligible for healthcare insurance through your employer.  °  How to apply: °Eligibility screenings are held every Tuesday and Wednesday afternoon from 1:00 pm until 4:00 pm. You do not need an appointment for the interview!  °[facility] [address], [place], [place] 336-631-2330   °[facility] [facility]  336-342-8273   °[facility]  336-703-3100   °[facility]  336-570-6415   ° °Behavioral Health Resources in the Community: °Intensive Outpatient Programs °Organization         Address  Phone  Notes  °[facility] [address], [place], [place] 336-878-6098   °[facility] [address], [place], [place] 336-832-9800   °[facility] [address], [place], [place] ° 336-882-2125   °[facility] [address],  °[place], [place] 1-800-853-5163 or 336-641-4981   °Substance Abuse Resources °Organization         Address  Phone  Notes  °[facility]  336-882-2125   °[facility]  336-784-9470   °[facility]  336-285-9073   °[facility]  336-845-3988   °Residential & Outpatient Substance Abuse Program  1-800-659-3381   °Psychological Services °Organization         Address  Phone  Notes  °[facility]  336- 832-9600   °[facility]  336- 378-7881   °[facility] [address], [place] 1-800-853-5163 or 336-641-4981   ° °Mobile Crisis Teams °Organization         Address  Phone  Notes  °[facility], Mobile Crisis Care Unit  1-877-626-1772   °Assertive °[facility] ° [address]  [place], [place] 336-834-9664   °Sharon DeEsch [address] °[place] 336-554-5454   ° °Self-Help/Support Groups °Organization         Address  Phone             Notes  °[facility]  - variety of support groups  336- 373-1402 Call for more information  °Narcotics Anonymous (NA), [facility] [address], °[place]   2 meetings at this location  ° °  Residential Treatment Programs °Organization         Address  Phone  Notes  °[facility] [address],    °[place]  1-866-801-8205   °[facility] ° [address], [place], [place] 704-293-8524   °[facility] [address], [place] 336-845-3988 Admissions: 8am-3pm M-F  °[facility] [address]    °[place], [place] 336-841-1104   °[facility] [address], [place],  [place] 336-379-7146   °[facility] [address]  °[place], [place] 336-285-9073   °[facility] Intensive Outpatient [address], [place], [place] 336-852-3033   °[facility] [address]  °[place], [place] 1-877-615-2722 or 336-784-9470   °[facility] [address] [place], [place] 336-227-7417 Accepts Medicaid  °[facility] [address]  °[place] 1-800-659-3381 Substance Abuse/Addiction Treatment  ° °[place] Behavioral Health Resources °Organization         Address  Phone  Notes  °[facility]  (888) 581-9988   °Julie Brannon, PhD [address] [place], [place]   (336) 349-5553 or (336) 951-0000   °[facility]   [address] °[place], [place] (336) 349-4454   °[facility] [address], [place], [place] (336) 342-8316 Insurance/Medicaid/sponsorship through [facility]  °[facility] [address]                                    [place], [place] (336) 342-8316 Therapy/tele-psych/case    °[facility] [address]  ° [place], [place] (336) 349-2233    °Dr. Arfeen  (336) 349-4544   °[facility] [place]  [employer] [place] [facility] 1) [address], [place] °2) [address], [place] °3)  [address], [place] (336) 349-3220 °(336) 342-7768 ° °(336) 342-8140   °[place] [facility] (336) 342-1394 or (336) 342-3537 (After Hours)    ° ° °Take your usual prescriptions as previously directed.  Call your regular medical doctor on Monday to schedule a follow up appointment within the next 2 days.  Return to the Emergency Department immediately sooner if worsening.  ° °

## 2014-10-13 NOTE — ED Notes (Signed)
Discharge instructions given, pt demonstrated teach back and verbal understanding. No concerns voiced.  

## 2014-10-13 NOTE — ED Notes (Signed)
Pt not able to obtain urine after voiding.

## 2014-10-13 NOTE — ED Notes (Signed)
Pt said she felt pretty good while ambulating. Said her head still hurt and felt a little sleepy. She had just woke up when I as going to walk her, so she might have felt groggy.

## 2014-10-13 NOTE — ED Notes (Signed)
[facility] during lunch trying to get out of chair.  Pt says  She only remember having blurred vision.  EMS says staff reported pt heading back of her head.

## 2014-10-13 NOTE — ED Provider Notes (Signed)
CSN: [id number]     Arrival date & time 10/13/14  1315 History   First MD Initiated Contact with Patient 10/13/14 1355     Chief Complaint  Patient presents with  . Fall      Patient is a 34 y.o. female presenting with fall. The history is provided by the patient and a caregiver. The history is limited by a developmental delay (Hx MR).  Fall  Pt was seen at 1355. Per pt's caregiver and pt: States pt stood up from eating lunch and then "fell backwards." Unclear if there was LOC. Pt states she "didn't get a lot of sleep last night" and "is sleepy." Pt has significant hx of MR and schizophrenia. Pt is acting per her baseline per caregiver at bedside. No reported N/V/D, no seizures, no incont/retention of bowel or bladder.     Past Medical History  Diagnosis Date  . Mild mental retardation   . Excessive anger   . Schizophrenia   . Hallucinations   . Diabetes mellitus without complication    History reviewed. No pertinent past surgical history.  History  Substance Use Topics  . Smoking status: Never Smoker   . Smokeless tobacco: Not on file  . Alcohol Use: No    Review of Systems  Unable to perform ROS: Psychiatric disorder      Allergies  Haloperidol lactate and Shrimp  Home Medications   Prior to Admission medications   Medication Sig Start Date End Date Taking? Authorizing Provider  atorvastatin (LIPITOR) 80 MG tablet Take 80 mg by mouth at bedtime.   Yes Historical Provider, MD  divalproex (DEPAKOTE) 500 MG DR tablet Take 500-1,000 mg by mouth 2 (two) times daily. Takes one tablet in the morning and two tablets at bedtime   Yes Historical Provider, MD  docusate sodium (COLACE) 100 MG capsule Take 100 mg by mouth 2 (two) times daily.   Yes Historical Provider, MD  insulin glargine (LANTUS) 100 UNIT/ML injection Inject 75 Units into the skin at bedtime.   Yes Historical Provider, MD  insulin lispro (HUMALOG) 100 UNIT/ML injection Inject 20-37 Units into the skin 3  (three) times daily before meals. 37 units given before breakfast and lunch, then 20 units before supper. **To be given within 15 minutes of completion of meals and NOT to be given if patient <50% of meal or pre-prandial glucose is 75mg /dL**   Yes Historical Provider, MD  ipratropium (ATROVENT) 0.03 % nasal spray Place 1 spray under the tongue 2 (two) times daily.    Yes Historical Provider, MD  medroxyPROGESTERone (DEPO-PROVERA) 150 MG/ML injection Inject 150 mg into the muscle every 3 (three) months.   Yes Historical Provider, MD  metFORMIN (GLUCOPHAGE) 500 MG tablet Take 1,000 mg by mouth 2 (two) times daily with a meal.   Yes Historical Provider, MD  methylphenidate 36 MG PO CR tablet Take 36 mg by mouth every morning.   Yes Historical Provider, MD  omeprazole (PRILOSEC) 20 MG capsule Take 40 mg by mouth daily.   Yes Historical Provider, MD  selenium sulfide (SELSUN) 2.5 % shampoo Apply 1 application topically every Wednesday.   Yes Historical Provider, MD   BP 127/74 mmHg  Pulse 97  Temp(Src) 97.4 F (36.3 C)  Resp 20  Ht  (1.575 m)  Wt 185 lb (83.915 kg)  BMI 33.83 kg/m2  SpO2 100%  Filed Vitals:   10/13/14 1330 10/13/14 1400 10/13/14 1500 10/13/14 1638  BP: 103/76 112/76 123/69 104/63  Pulse: 109  102 95 99  Temp:      Resp:   18 15  Height:      Weight:      SpO2: 100% 100% 100% 100%   13:28:12 Orthostatic Vital Signs DA  Orthostatic Lying  - BP- Lying: 99/67 mmHg ; Pulse- Lying: 103  Orthostatic Sitting - BP- Sitting: 104/82 mmHg (pt says she feels a little dizzy when sitting up.) ; Pulse- Sitting: 110  Orthostatic Standing at 0 minutes - BP- Standing at 0 minutes: 103/76 mmHg ; Pulse- Standing at 0 minutes: 109    Physical Exam 1400: Physical examination:  Nursing notes reviewed; Vital signs and O2 SAT reviewed;  Constitutional: Well developed, Well nourished, Well hydrated, In no acute distress; Head:  Normocephalic, atraumatic; Eyes: EOMI, PERRL, No scleral  icterus; ENMT: Mouth and pharynx normal, Mucous membranes moist; Neck: Supple, Full range of motion, No lymphadenopathy; Cardiovascular: Regular rate and rhythm, No murmur, rub, or gallop; Respiratory: Breath sounds clear & equal bilaterally, No rales, rhonchi, wheezes.  Speaking full sentences with ease, Normal respiratory effort/excursion; Chest: Nontender, Movement normal; Abdomen: Soft, Nontender, Nondistended, Normal bowel sounds; Genitourinary: No CVA tenderness; Extremities: Pulses normal, No tenderness, No edema, No calf edema or asymmetry.; Neuro: Awake, alert, poor historian per hx. Acting per baseline per caregiver at bedside. Major CN grossly intact.  Speech clear. No gross focal motor or sensory deficits in extremities.; Skin: Color normal, Warm, Dry.; Psych:  Affect flat, poor eye contact.     ED Course  Procedures     EKG Interpretation   Date/Time:  Saturday October 13 2014 14:11:55 EST Ventricular Rate:  100 PR Interval:  118 QRS Duration: 87 QT Interval:  349 QTC Calculation: 450 R Axis:   43 Text Interpretation:  Sinus tachycardia When compared with ECG of 03/13/2011  No significant change was found Confirmed by [facility]  MD, Nicholos Johns  [phone]) on 10/13/2014 3:08:24 PM      MDM  MDM Reviewed: previous chart, nursing note and vitals Reviewed previous: labs and ECG Interpretation: labs, CT scan and ECG      Results for orders placed or performed during the hospital encounter of 10/13/14  Urinalysis, Routine w reflex microscopic  Result Value Ref Range   Color, Urine YELLOW YELLOW   APPearance CLEAR CLEAR   Specific Gravity, Urine 1.015 1.005 - 1.030   pH 6.0 5.0 - 8.0   Glucose, UA NEGATIVE NEGATIVE mg/dL   Hgb urine dipstick NEGATIVE NEGATIVE   Bilirubin Urine NEGATIVE NEGATIVE   Ketones, ur 15 (A) NEGATIVE mg/dL   Protein, ur NEGATIVE NEGATIVE mg/dL   Urobilinogen, UA 0.2 0.0 - 1.0 mg/dL   Nitrite NEGATIVE NEGATIVE   Leukocytes, UA NEGATIVE NEGATIVE   Pregnancy, urine  Result Value Ref Range   Preg Test, Ur NEGATIVE NEGATIVE  CBC with Differential/Platelet  Result Value Ref Range   WBC 13.8 (H) 4.0 - 10.5 K/uL   RBC 5.11 3.87 - 5.11 MIL/uL   Hemoglobin 13.8 12.0 - 15.0 g/dL   HCT 40.9 81.1 - 91.4 %   MCV 86.7 78.0 - 100.0 fL   MCH 27.0 26.0 - 34.0 pg   MCHC 31.2 30.0 - 36.0 g/dL   RDW 78.2 (H) 95.6 - 21.3 %   Platelets 215 150 - 400 K/uL   Neutrophils Relative % 59 43 - 77 %   Neutro Abs 8.1 (H) 1.7 - 7.7 K/uL   Lymphocytes Relative 32 12 - 46 %   Lymphs Abs 4.5 (H) 0.7 -  4.0 K/uL   Monocytes Relative 7 3 - 12 %   Monocytes Absolute 1.0 0.1 - 1.0 K/uL   Eosinophils Relative 2 0 - 5 %   Eosinophils Absolute 0.2 0.0 - 0.7 K/uL   Basophils Relative 0 0 - 1 %   Basophils Absolute 0.0 0.0 - 0.1 K/uL  Comprehensive metabolic panel  Result Value Ref Range   Sodium 140 135 - 145 mmol/L   Potassium 4.5 3.5 - 5.1 mmol/L   Chloride 108 96 - 112 mmol/L   CO2 24 19 - 32 mmol/L   Glucose, Bld 113 (H) 70 - 99 mg/dL   BUN 10 6 - 23 mg/dL   Creatinine, Ser 1.61 0.50 - 1.10 mg/dL   Calcium 9.2 8.4 - 09.6 mg/dL   Total Protein 7.9 6.0 - 8.3 g/dL   Albumin 3.3 (L) 3.5 - 5.2 g/dL   AST 22 0 - 37 U/L   ALT 19 0 - 35 U/L   Alkaline Phosphatase 58 39 - 117 U/L   Total Bilirubin 0.4 0.3 - 1.2 mg/dL   GFR calc non Af Amer >90 >90 mL/min   GFR calc Af Amer >90 >90 mL/min   Anion gap 8 5 - 15  Lipase, blood  Result Value Ref Range   Lipase 28 11 - 59 U/L   Ct Head Wo Contrast 10/13/2014   CLINICAL DATA:  Fall, occipital head trauma, blurred vision  EXAM: CT HEAD WITHOUT CONTRAST  CT CERVICAL SPINE WITHOUT CONTRAST  TECHNIQUE: Multidetector CT imaging of the head and cervical spine was performed following the standard protocol without intravenous contrast. Multiplanar CT image reconstructions of the cervical spine were also generated.  COMPARISON:  Brain MRI 06/30/2009.  No prior head CT for comparison.  FINDINGS: CT HEAD FINDINGS  No acute  hemorrhage, infarct, or mass lesion is identified. No midline shift. Ventricles are normal in size. Orbits and paranasal sinuses are unremarkable. No skull fracture.  CT CERVICAL SPINE FINDINGS  C1 through the cervicothoracic junction is visualized in its entirety. No precervical soft tissue widening. Normal alignment. No fracture or dislocation.  IMPRESSION: No acute intracranial finding. Normal head CT and cervical spine CT.   Electronically Signed   By: Christiana Pellant M.D.   On: 10/13/2014 16:41   Ct Cervical Spine Wo Contrast 10/13/2014   CLINICAL DATA:  Fall, occipital head trauma, blurred vision  EXAM: CT HEAD WITHOUT CONTRAST  CT CERVICAL SPINE WITHOUT CONTRAST  TECHNIQUE: Multidetector CT imaging of the head and cervical spine was performed following the standard protocol without intravenous contrast. Multiplanar CT image reconstructions of the cervical spine were also generated.  COMPARISON:  Brain MRI 06/30/2009.  No prior head CT for comparison.  FINDINGS: CT HEAD FINDINGS  No acute hemorrhage, infarct, or mass lesion is identified. No midline shift. Ventricles are normal in size. Orbits and paranasal sinuses are unremarkable. No skull fracture.  CT CERVICAL SPINE FINDINGS  C1 through the cervicothoracic junction is visualized in its entirety. No precervical soft tissue widening. Normal alignment. No fracture or dislocation.  IMPRESSION: No acute intracranial finding. Normal head CT and cervical spine CT.   Electronically Signed   By: Christiana Pellant M.D.   On: 10/13/2014 16:41     1900:  Pt has slept most of her ED visit. VS improved after IVF. Now that she is awake: pt has tol PO well while in the ED without N/V and has ambulated with steady gait. Pt's caregiver states pt continues to act  per her baseline and they would like to take her back to the facility now. Dx and testing d/w pt and caregiver.  Questions answered.  Verb understanding, agreeable to d/c home with outpt f/u.    Samuel JesterKathleen  Torrey Horseman, DO 10/17/14 [phone]

## 2014-11-12 ENCOUNTER — Emergency Department (HOSPITAL_COMMUNITY)
Admission: EM | Admit: 2014-11-12 | Discharge: 2014-11-13 | Disposition: A | Discharge status: Home / Self Care | Payer: Medicare Other | Attending: Emergency Medicine | Admitting: Emergency Medicine

## 2014-11-12 ENCOUNTER — Encounter (HOSPITAL_COMMUNITY): Payer: Self-pay | Admitting: *Deleted

## 2014-11-12 DIAGNOSIS — E119 Type 2 diabetes mellitus without complications: Secondary | ICD-10-CM | POA: Insufficient documentation

## 2014-11-12 DIAGNOSIS — T148 Other injury of unspecified body region: Secondary | ICD-10-CM | POA: Insufficient documentation

## 2014-11-12 DIAGNOSIS — Z794 Long term (current) use of insulin: Secondary | ICD-10-CM | POA: Insufficient documentation

## 2014-11-12 DIAGNOSIS — Y9389 Activity, other specified: Secondary | ICD-10-CM | POA: Insufficient documentation

## 2014-11-12 DIAGNOSIS — Z8659 Personal history of other mental and behavioral disorders: Secondary | ICD-10-CM | POA: Insufficient documentation

## 2014-11-12 DIAGNOSIS — Z79899 Other long term (current) drug therapy: Secondary | ICD-10-CM | POA: Insufficient documentation

## 2014-11-12 DIAGNOSIS — I10 Essential (primary) hypertension: Secondary | ICD-10-CM | POA: Insufficient documentation

## 2014-11-12 DIAGNOSIS — R454 Irritability and anger: Secondary | ICD-10-CM

## 2014-11-12 DIAGNOSIS — R45851 Suicidal ideations: Secondary | ICD-10-CM | POA: Insufficient documentation

## 2014-11-12 DIAGNOSIS — Y9289 Other specified places as the place of occurrence of the external cause: Secondary | ICD-10-CM | POA: Insufficient documentation

## 2014-11-12 DIAGNOSIS — Z3202 Encounter for pregnancy test, result negative: Secondary | ICD-10-CM | POA: Insufficient documentation

## 2014-11-12 DIAGNOSIS — Y998 Other external cause status: Secondary | ICD-10-CM | POA: Insufficient documentation

## 2014-11-12 HISTORY — DX: Essential (primary) hypertension: I10
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
## 2014-11-13 DIAGNOSIS — R45851 Suicidal ideations: Secondary | ICD-10-CM

## 2014-11-13 DIAGNOSIS — F911 Conduct disorder, childhood-onset type: Secondary | ICD-10-CM

## 2014-11-13 LAB — COMPREHENSIVE METABOLIC PANEL
ALK PHOS: 49 U/L (ref 39–117)
ALT: 14 U/L (ref 0–35)
AST: 18 U/L (ref 0–37)
Albumin: 2.9 g/dL — ABNORMAL LOW (ref 3.5–5.2)
Anion gap: 9 (ref 5–15)
BUN: 14 mg/dL (ref 6–23)
CHLORIDE: 109 mmol/L (ref 96–112)
CO2: 21 mmol/L (ref 19–32)
Calcium: 8.7 mg/dL (ref 8.4–10.5)
Creatinine, Ser: 0.57 mg/dL (ref 0.50–1.10)
GLUCOSE: 106 mg/dL — AB (ref 70–99)
POTASSIUM: 3.6 mmol/L (ref 3.5–5.1)
Sodium: 139 mmol/L (ref 135–145)
Total Bilirubin: 0.1 mg/dL — ABNORMAL LOW (ref 0.3–1.2)
Total Protein: 6.4 g/dL (ref 6.0–8.3)

## 2014-11-13 LAB — VALPROIC ACID LEVEL: Valproic Acid Lvl: 41.5 ug/mL — ABNORMAL LOW (ref 50.0–100.0)

## 2014-11-13 LAB — CBC WITH DIFFERENTIAL/PLATELET
BASOS ABS: 0 10*3/uL (ref 0.0–0.1)
Basophils Relative: 0 % (ref 0–1)
Eosinophils Absolute: 0.5 10*3/uL (ref 0.0–0.7)
Eosinophils Relative: 3 % (ref 0–5)
HCT: 34.8 % — ABNORMAL LOW (ref 36.0–46.0)
HEMOGLOBIN: 11 g/dL — AB (ref 12.0–15.0)
Lymphocytes Relative: 32 % (ref 12–46)
Lymphs Abs: 4.7 10*3/uL — ABNORMAL HIGH (ref 0.7–4.0)
MCH: 27.2 pg (ref 26.0–34.0)
MCHC: 31.6 g/dL (ref 30.0–36.0)
MCV: 86.1 fL (ref 78.0–100.0)
MONOS PCT: 6 % (ref 3–12)
Monocytes Absolute: 0.8 10*3/uL (ref 0.1–1.0)
NEUTROS PCT: 59 % (ref 43–77)
Neutro Abs: 8.5 10*3/uL — ABNORMAL HIGH (ref 1.7–7.7)
PLATELETS: 190 10*3/uL (ref 150–400)
RBC: 4.04 MIL/uL (ref 3.87–5.11)
RDW: 17.8 % — ABNORMAL HIGH (ref 11.5–15.5)
WBC: 14.6 10*3/uL — AB (ref 4.0–10.5)

## 2014-11-13 LAB — RAPID URINE DRUG SCREEN, HOSP PERFORMED
AMPHETAMINES: NOT DETECTED
BARBITURATES: NOT DETECTED
Benzodiazepines: POSITIVE — AB
COCAINE: NOT DETECTED
Opiates: NOT DETECTED
TETRAHYDROCANNABINOL: NOT DETECTED

## 2014-11-13 LAB — CBG MONITORING, ED
GLUCOSE-CAPILLARY: 157 mg/dL — AB (ref 70–99)
Glucose-Capillary: 128 mg/dL — ABNORMAL HIGH (ref 70–99)
Glucose-Capillary: 157 mg/dL — ABNORMAL HIGH (ref 70–99)
Glucose-Capillary: 127 mg/dL — ABNORMAL HIGH (ref 70–99)

## 2014-11-13 LAB — URINALYSIS, ROUTINE W REFLEX MICROSCOPIC
BILIRUBIN URINE: NEGATIVE
GLUCOSE, UA: NEGATIVE mg/dL
Hgb urine dipstick: NEGATIVE
Ketones, ur: 15 mg/dL — AB
LEUKOCYTES UA: NEGATIVE
Nitrite: NEGATIVE
Protein, ur: NEGATIVE mg/dL
SPECIFIC GRAVITY, URINE: 1.02 (ref 1.005–1.030)
Urobilinogen, UA: 0.2 mg/dL (ref 0.0–1.0)
pH: 6 (ref 5.0–8.0)

## 2014-11-13 LAB — PREGNANCY, URINE: Preg Test, Ur: NEGATIVE

## 2014-11-13 LAB — ETHANOL: Alcohol, Ethyl (B): <5 mg/dL (ref 0–9)

## 2014-11-13 MED ORDER — INSULIN ASPART PROT & ASPART (70-30 MIX) 100 UNIT/ML ~~LOC~~ SUSP
37.0000 [IU] | Freq: Every day | SUBCUTANEOUS | Status: DC
  Filled 2014-11-13: qty 10

## 2014-11-13 MED ORDER — ATORVASTATIN CALCIUM 40 MG PO TABS
80.0000 mg | ORAL_TABLET | Freq: Every day | ORAL | Status: DC
  Filled 2014-11-13: qty 2

## 2014-11-13 MED ORDER — DIVALPROEX SODIUM 250 MG PO DR TAB: 1000.0000 mg | DELAYED_RELEASE_TABLET | Freq: Every day | ORAL | Status: DC

## 2014-11-13 MED ORDER — METFORMIN HCL 500 MG PO TABS
1000.0000 mg | ORAL_TABLET | Freq: Two times a day (BID) | ORAL | Status: DC
  Administered 2014-11-13 (×2): 1000 mg via ORAL
  Filled 2014-11-13 (×2): qty 2

## 2014-11-13 MED ORDER — INSULIN ASPART 100 UNIT/ML ~~LOC~~ SOLN
37.0000 [IU] | Freq: Every day | SUBCUTANEOUS | Status: DC
  Administered 2014-11-13: 37 [IU] via SUBCUTANEOUS
  Filled 2014-11-13: qty 1

## 2014-11-13 MED ORDER — INSULIN ASPART PROT & ASPART (70-30 MIX) 100 UNIT/ML ~~LOC~~ SUSP
20.0000 [IU] | Freq: Every day | SUBCUTANEOUS | Status: DC
  Filled 2014-11-13: qty 10

## 2014-11-13 MED ORDER — SELENIUM SULFIDE 1 % EX LOTN
TOPICAL_LOTION | CUTANEOUS | Status: DC
  Filled 2014-11-13: qty 207

## 2014-11-13 MED ORDER — METHYLPHENIDATE HCL ER (OSM) 36 MG PO TBCR: 36.0000 mg | EXTENDED_RELEASE_TABLET | Freq: Every morning | ORAL | Status: DC

## 2014-11-13 MED ORDER — ALUM & MAG HYDROXIDE-SIMETH 200-200-20 MG/5ML PO SUSP: 30.0000 mL | ORAL | Status: DC | PRN

## 2014-11-13 MED ORDER — INSULIN GLARGINE 100 UNIT/ML ~~LOC~~ SOLN
75.0000 [IU] | Freq: Every day | SUBCUTANEOUS | Status: DC
  Filled 2014-11-13: qty 0.75

## 2014-11-13 MED ORDER — PANTOPRAZOLE SODIUM 40 MG PO TBEC
40.0000 mg | DELAYED_RELEASE_TABLET | Freq: Every day | ORAL | Status: DC
  Administered 2014-11-13: 40 mg via ORAL
  Filled 2014-11-13: qty 1

## 2014-11-13 MED ORDER — ONDANSETRON HCL 4 MG PO TABS: 4.0000 mg | ORAL_TABLET | Freq: Three times a day (TID) | ORAL | Status: DC | PRN

## 2014-11-13 MED ORDER — IBUPROFEN 400 MG PO TABS: 600.0000 mg | ORAL_TABLET | Freq: Three times a day (TID) | ORAL | Status: DC | PRN

## 2014-11-13 MED ORDER — SELENIUM SULFIDE 2.5 % EX LOTN: 1.0000 "application " | TOPICAL_LOTION | CUTANEOUS | Status: DC

## 2014-11-13 MED ORDER — ACETAMINOPHEN 325 MG PO TABS: 650.0000 mg | ORAL_TABLET | ORAL | Status: DC | PRN

## 2014-11-13 MED ORDER — DIVALPROEX SODIUM 250 MG PO DR TAB
500.0000 mg | DELAYED_RELEASE_TABLET | Freq: Every day | ORAL | Status: DC
  Administered 2014-11-13: 500 mg via ORAL
  Filled 2014-11-13: qty 2

## 2014-11-13 NOTE — ED Notes (Signed)
TTs at this time
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
## 2014-11-13 NOTE — ED Notes (Signed)
Patient ambulatory to restroom  ?

## 2014-11-13 NOTE — Progress Notes (Signed)
Per nurse Marchelle FolksAmanda, pt will not be assessed by Group Home but pt will be taken home tonight at 8p by [name] from [facility].
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
## 2014-11-13 NOTE — ED Notes (Signed)
Drink and crackers provided at patient request

## 2014-11-13 NOTE — ED Notes (Addendum)
Pt resting at this time.
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
## 2014-11-13 NOTE — ED Notes (Signed)
Pt sleeping at this time.

## 2014-11-13 NOTE — ED Notes (Signed)
Patient lying in bed in a position of observed comfort. NAD noted at this time.

## 2014-11-13 NOTE — Progress Notes (Signed)
CSW spoke with [name] from [facility] [phone]) to discuss Pt's discharge. CSW informed [name] that Pt has been cleared psychiatrically and is ready for discharge; CSW requested that [name] have group home staff come to assess Pt at the hospital. [name] reported that he would attempt to schedule someone to come see her and then call CSW back to confirm.   Chad CordialLauren Carter, LCSWA 11/13/2014 12:44 PM

## 2014-11-13 NOTE — ED Notes (Signed)
[name] from [facility] will be here around 2000 to bring pt home

## 2014-11-13 NOTE — Discharge Instructions (Signed)
Anger Management Anger is a normal human emotion. However, anger can range from mild irritation to rage. When your anger becomes harmful to yourself or others, it is unhealthy anger.  CAUSES  There are many reasons for unhealthy anger. Many people learn how to express anger from observing how their family expressed anger. In troubled, chaotic, or abusive families, anger can be expressed as rage or even violence. Children can grow up never learning how healthy anger can be expressed. Factors that contribute to unhealthy anger include:  1. Drug or alcohol abuse. 2. Post-traumatic stress disorder. 3. Traumatic brain injury. COMPLICATIONS  People with unhealthy anger tend to overreact and retaliate against a real or imagined threat. The need to retaliate can turn into violence or verbal abuse against another person. Chronic anger can lead to health problems, such as hypertension, high blood pressure, and depression. TREATMENT  Exercising, relaxing, meditating, or writing out your feelings all can be beneficial in managing moderate anger. For unhealthy anger, the following methods may be used:  Cognitive-behavioral counseling (learning skills to change the thoughts that influence your mood).  Relaxation training.  Interpersonal counseling.  Assertive communication skills.  Medication. Document Released: 06/21/2007 Document Revised: 11/16/2011 Document Reviewed: 10/30/2010 [facility] Patient Information 2015 [place], [place]. This information is not intended to replace advice given to you by your health care provider. Make sure you discuss any questions you have with your health care provider.  Depression Depression refers to feeling sad, low, down in the dumps, blue, gloomy, or empty. In general, there are two kinds of depression: 4. Normal sadness or normal grief. This kind of depression is one that we all feel from time to time after upsetting life experiences, such as the loss of a job or the  ending of a relationship. This kind of depression is considered normal, is short lived, and resolves within a few days to 2 weeks. Depression experienced after the loss of a loved one (bereavement) often lasts longer than 2 weeks but normally gets better with time. 5. Clinical depression. This kind of depression lasts longer than normal sadness or normal grief or interferes with your ability to function at home, at work, and in school. It also interferes with your personal relationships. It affects almost every aspect of your life. Clinical depression is an illness. Symptoms of depression can also be caused by conditions other than those mentioned above, such as:  Physical illness. Some physical illnesses, including underactive thyroid gland (hypothyroidism), severe anemia, specific types of cancer, diabetes, uncontrolled seizures, heart and lung problems, strokes, and chronic pain are commonly associated with symptoms of depression.  Side effects of some prescription medicine. In some people, certain types of medicine can cause symptoms of depression.  Substance abuse. Abuse of alcohol and illicit drugs can cause symptoms of depression. SYMPTOMS Symptoms of normal sadness and normal grief include the following:  Feeling sad or crying for short periods of time.  Not caring about anything (apathy).  Difficulty sleeping or sleeping too much.  No longer able to enjoy the things you used to enjoy.  Desire to be by oneself all the time (social isolation).  Lack of energy or motivation.  Difficulty concentrating or remembering.  Change in appetite or weight.  Restlessness or agitation. Symptoms of clinical depression include the same symptoms of normal sadness or normal grief and also the following symptoms:  Feeling sad or crying all the time.  Feelings of guilt or worthlessness.  Feelings of hopelessness or helplessness.  Thoughts of  suicide or the desire to harm yourself (suicidal  ideation).  Loss of touch with reality (psychotic symptoms). Seeing or hearing things that are not real (hallucinations) or having false beliefs about your life or the people around you (delusions and paranoia). DIAGNOSIS  The diagnosis of clinical depression is usually based on how bad the symptoms are and how long they have lasted. Your health care provider will also ask you questions about your medical history and substance use to find out if physical illness, use of prescription medicine, or substance abuse is causing your depression. Your health care provider may also order blood tests. TREATMENT  Often, normal sadness and normal grief do not require treatment. However, sometimes antidepressant medicine is given for bereavement to ease the depressive symptoms until they resolve. The treatment for clinical depression depends on how bad the symptoms are but often includes antidepressant medicine, counseling with a mental health professional, or both. Your health care provider will help to determine what treatment is best for you. Depression caused by physical illness usually goes away with appropriate medical treatment of the illness. If prescription medicine is causing depression, talk with your health care provider about stopping the medicine, decreasing the dose, or changing to another medicine. Depression caused by the abuse of alcohol or illicit drugs goes away when you stop using these substances. Some adults need professional help in order to stop drinking or using drugs. SEEK IMMEDIATE MEDICAL CARE IF:  You have thoughts about hurting yourself or others.  You lose touch with reality (have psychotic symptoms).  You are taking medicine for depression and have a serious side effect. FOR MORE INFORMATION  National Alliance on Mental Illness: www.nami.[employer]  National Institute of Mental Health: http://www.maynard.net/www.nimh.nih.gov Document Released: 08/21/2000 Document Revised: 01/08/2014 Document Reviewed:  11/23/2011 [facility] Patient Information 2015 [place]. This information is not intended to replace advice given to you by your health care provider. Make sure you discuss any questions you have with your health care provider.

## 2014-11-13 NOTE — ED Notes (Signed)
Staff from [facility] requesting pt's CBG to be tested

## 2014-11-13 NOTE — ED Notes (Signed)
Pt informed of transportation states they are 10 mins away

## 2014-11-19 ENCOUNTER — Emergency Department (HOSPITAL_COMMUNITY)
Admission: EM | Admit: 2014-11-19 | Discharge: 2014-11-27 | Disposition: A | Discharge status: Other Acute Inpatient Hospital | Payer: Medicare Other | Attending: Emergency Medicine | Admitting: Emergency Medicine

## 2014-11-19 ENCOUNTER — Encounter (HOSPITAL_COMMUNITY): Payer: Self-pay

## 2014-11-19 DIAGNOSIS — I10 Essential (primary) hypertension: Secondary | ICD-10-CM | POA: Insufficient documentation

## 2014-11-19 DIAGNOSIS — R45851 Suicidal ideations: Secondary | ICD-10-CM | POA: Insufficient documentation

## 2014-11-19 DIAGNOSIS — R4689 Other symptoms and signs involving appearance and behavior: Secondary | ICD-10-CM

## 2014-11-19 DIAGNOSIS — E119 Type 2 diabetes mellitus without complications: Secondary | ICD-10-CM | POA: Insufficient documentation

## 2014-11-19 LAB — CBC WITH DIFFERENTIAL/PLATELET
BASOS ABS: 0 10*3/uL (ref 0.0–0.1)
BASOS PCT: 0 % (ref 0–1)
EOS PCT: 7 % — AB (ref 0–5)
Eosinophils Absolute: 0.8 10*3/uL — ABNORMAL HIGH (ref 0.0–0.7)
HCT: 36.9 % (ref 36.0–46.0)
Hemoglobin: 11.7 g/dL — ABNORMAL LOW (ref 12.0–15.0)
Lymphocytes Relative: 31 % (ref 12–46)
Lymphs Abs: 3.6 10*3/uL (ref 0.7–4.0)
MCH: 27.4 pg (ref 26.0–34.0)
MCHC: 31.7 g/dL (ref 30.0–36.0)
MCV: 86.4 fL (ref 78.0–100.0)
Monocytes Absolute: 0.7 10*3/uL (ref 0.1–1.0)
Monocytes Relative: 6 % (ref 3–12)
Neutro Abs: 6.6 10*3/uL (ref 1.7–7.7)
Neutrophils Relative %: 56 % (ref 43–77)
PLATELETS: 197 10*3/uL (ref 150–400)
RBC: 4.27 MIL/uL (ref 3.87–5.11)
RDW: 17.8 % — AB (ref 11.5–15.5)
WBC: 11.8 10*3/uL — ABNORMAL HIGH (ref 4.0–10.5)

## 2014-11-19 LAB — BASIC METABOLIC PANEL
Anion gap: 7 (ref 5–15)
BUN: 11 mg/dL (ref 6–23)
CO2: 23 mmol/L (ref 19–32)
Calcium: 8.6 mg/dL (ref 8.4–10.5)
Chloride: 109 mmol/L (ref 96–112)
Creatinine, Ser: 0.53 mg/dL (ref 0.50–1.10)
GLUCOSE: 117 mg/dL — AB (ref 70–99)
POTASSIUM: 3.8 mmol/L (ref 3.5–5.1)
Sodium: 139 mmol/L (ref 135–145)

## 2014-11-19 LAB — RAPID URINE DRUG SCREEN, HOSP PERFORMED
Amphetamines: NOT DETECTED
BARBITURATES: NOT DETECTED
BENZODIAZEPINES: POSITIVE — AB
COCAINE: NOT DETECTED
Opiates: NOT DETECTED
Tetrahydrocannabinol: NOT DETECTED

## 2014-11-19 LAB — ETHANOL: ALCOHOL ETHYL (B): 5 mg/dL (ref 0–9)

## 2014-11-19 LAB — URINALYSIS, ROUTINE W REFLEX MICROSCOPIC
BILIRUBIN URINE: NEGATIVE
Glucose, UA: NEGATIVE mg/dL
HGB URINE DIPSTICK: NEGATIVE
Ketones, ur: NEGATIVE mg/dL
Leukocytes, UA: NEGATIVE
NITRITE: NEGATIVE
Protein, ur: NEGATIVE mg/dL
SPECIFIC GRAVITY, URINE: 1.01 (ref 1.005–1.030)
Urobilinogen, UA: 0.2 mg/dL (ref 0.0–1.0)
pH: 6 (ref 5.0–8.0)

## 2014-11-19 LAB — CBG MONITORING, ED: GLUCOSE-CAPILLARY: 126 mg/dL — AB (ref 70–99)

## 2014-11-19 LAB — PREGNANCY, URINE: Preg Test, Ur: NEGATIVE
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
## 2014-11-19 NOTE — ED Notes (Signed)
Pt receiving TSS at this time

## 2014-11-20 DIAGNOSIS — R45850 Homicidal ideations: Secondary | ICD-10-CM

## 2014-11-20 DIAGNOSIS — R45851 Suicidal ideations: Secondary | ICD-10-CM

## 2014-11-20 DIAGNOSIS — F259 Schizoaffective disorder, unspecified: Secondary | ICD-10-CM

## 2014-11-20 LAB — CBG MONITORING, ED
GLUCOSE-CAPILLARY: 201 mg/dL — AB (ref 70–99)
GLUCOSE-CAPILLARY: 197 mg/dL — AB (ref 70–99)
Glucose-Capillary: 274 mg/dL — ABNORMAL HIGH (ref 70–99)

## 2014-11-20 MED ORDER — STERILE WATER FOR INJECTION IJ SOLN
INTRAMUSCULAR | Status: AC
  Administered 2014-11-20: 10 mL
  Filled 2014-11-20: qty 10

## 2014-11-20 MED ORDER — LORAZEPAM 2 MG/ML IJ SOLN
INTRAMUSCULAR | Status: AC
  Filled 2014-11-20: qty 1

## 2014-11-20 MED ORDER — LORAZEPAM 2 MG/ML IJ SOLN
2.0000 mg | Freq: Once | INTRAMUSCULAR | Status: AC
  Administered 2014-11-20: 2 mg via INTRAMUSCULAR

## 2014-11-20 MED ORDER — ZIPRASIDONE MESYLATE 20 MG IM SOLR
10.0000 mg | Freq: Once | INTRAMUSCULAR | Status: AC
  Administered 2014-11-20: 10 mg via INTRAMUSCULAR
  Filled 2014-11-20: qty 20

## 2014-11-20 MED ORDER — ZIPRASIDONE MESYLATE 20 MG IM SOLR
INTRAMUSCULAR | Status: AC
  Filled 2014-11-20: qty 20

## 2014-11-20 MED ORDER — ZIPRASIDONE MESYLATE 20 MG IM SOLR
10.0000 mg | Freq: Once | INTRAMUSCULAR | Status: AC
  Administered 2014-11-20: 10 mg via INTRAMUSCULAR
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
## 2014-11-20 NOTE — ED Provider Notes (Signed)
Patient sleeping on AM rounds.  Per nursing staff, no events overnight.  BP 116/63 mmHg  Pulse 100  Temp(Src) 98.7 F (37.1 C) (Oral)  Resp 16  Ht 5' 2.5" (1.588 m)  Wt 194 lb 1.6 oz (88.043 kg)  BMI 34.91 kg/m2  SpO2 100%   Gerhard Munchobert Toniyah Dilmore, MD 11/20/14 [phone]

## 2014-11-20 NOTE — ED Notes (Signed)
Harold, from [facility], made aware that re-assessment would be made at 8:30 or in a short time after.

## 2014-11-20 NOTE — ED Notes (Addendum)
Facility was called to transport pt back to facility. Alexandria Harvey requests that we have him there while TTS consult is being done. Behavioral Health made aware of this request and suggests we put in a Telepsych re-assessment.

## 2014-11-20 NOTE — ED Notes (Signed)
Pt crying loudly and saying that she is seeing monsters and that she's "scared".

## 2014-11-20 NOTE — BH Assessment (Signed)
TTS (Tynisa Vohs) informed the RN Zella Ball(Robin) that the consult will need to be taken out because the patient has already received an assessment with TTS Cyndie Mull(Anna Andrews).  RN Zella Ball(Robin) place a consult to psychiatry to have the patient assessed by an extender for a possible discharge.
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
## 2014-11-20 NOTE — ED Notes (Signed)
Pt gave verbal consent for [name], from caregiver facility, to be at beside during re-telepsych assessment.

## 2014-11-20 NOTE — ED Notes (Signed)
Called [name] Carelock to inform him pt is to be discharged, left message to call back

## 2014-11-20 NOTE — [facility] Counselor (Signed)
Per Donell SievertSpencer Simon, PA, pt does not meet inpatient tx criteria. Discharge back to group home in the morning with appropriate outpatient resources.  TTS Counselor spoke with pt's attending RN Inetta Fermo(Tina) and informed her of disposition.  Appropriate resources and no-harm contract were faxed to [facility] at 00:20 on 3/15. Resources included lists of psychiatrists, therapists, and agencies accommodating to individuals with co-occurring MH and DD diagnoses (e.g. [facility], [place] [facility]), as well as suicide prevention information and help-line numbers.    Cyndie MullAnna Dominie Benedick, [facility] Triage Specialist

## 2014-11-20 NOTE — Progress Notes (Signed)
CSW faxed patient referral to [facility], per [facility],  they will have more d/c tomorrow in am.  Will continue to follow up.  Melbourne Abtsatia Lenor Provencher, LCSWA Disposition staff 11/20/2014 9:20 PM

## 2014-11-20 NOTE — Progress Notes (Addendum)
Writer called Ma RingsHarold Carelock [phone] and requested patient's guardianship papers and IQ/psychological assessment to be faxed to [phone] [facility]). Mr Laurian BrimCarelock stated that he will fax the documents to Clinical research associatewriter.  CSW will continue to follow up.  Melbourne Abtsatia Mckenna Gamm, LCSWA Disposition staff 11/20/2014 4:15 PM

## 2014-11-21 LAB — CBG MONITORING, ED
Glucose-Capillary: 208 mg/dL — ABNORMAL HIGH (ref 70–99)
Glucose-Capillary: 157 mg/dL — ABNORMAL HIGH (ref 70–99)
Glucose-Capillary: 203 mg/dL — ABNORMAL HIGH (ref 70–99)
Glucose-Capillary: 172 mg/dL — ABNORMAL HIGH (ref 70–99)

## 2014-11-21 MED ORDER — INSULIN ASPART 100 UNIT/ML ~~LOC~~ SOLN
0.0000 [IU] | Freq: Three times a day (TID) | SUBCUTANEOUS | Status: DC
  Administered 2014-11-21: 3 [IU] via SUBCUTANEOUS
  Administered 2014-11-21: 5 [IU] via SUBCUTANEOUS
  Administered 2014-11-22: 2 [IU] via SUBCUTANEOUS
  Administered 2014-11-24: 3 [IU] via SUBCUTANEOUS
  Administered 2014-11-25 – 2014-11-27 (×4): 2 [IU] via SUBCUTANEOUS
  Filled 2014-11-21 (×7): qty 1

## 2014-11-21 MED ORDER — INSULIN ASPART 100 UNIT/ML ~~LOC~~ SOLN
0.0000 [IU] | Freq: Every day | SUBCUTANEOUS | Status: DC
  Administered 2014-11-21: 0 [IU] via SUBCUTANEOUS

## 2014-11-21 MED ORDER — METFORMIN HCL 500 MG PO TABS
1000.0000 mg | ORAL_TABLET | Freq: Two times a day (BID) | ORAL | Status: DC
  Administered 2014-11-21 – 2014-11-27 (×13): 1000 mg via ORAL
  Filled 2014-11-21 (×13): qty 2

## 2014-11-21 MED ORDER — DOCUSATE SODIUM 100 MG PO CAPS
100.0000 mg | ORAL_CAPSULE | Freq: Two times a day (BID) | ORAL | Status: DC
  Administered 2014-11-21 – 2014-11-27 (×12): 100 mg via ORAL
  Filled 2014-11-21 (×18): qty 1

## 2014-11-21 MED ORDER — LORAZEPAM 1 MG PO TABS
1.0000 mg | ORAL_TABLET | Freq: Two times a day (BID) | ORAL | Status: DC
  Administered 2014-11-21 – 2014-11-22 (×3): 2 mg via ORAL
  Administered 2014-11-22: 1 mg via ORAL
  Administered 2014-11-23: 2 mg via ORAL
  Administered 2014-11-23 – 2014-11-27 (×7): 1 mg via ORAL
  Filled 2014-11-21 (×7): qty 1
  Filled 2014-11-21 (×2): qty 2
  Filled 2014-11-21 (×2): qty 1
  Filled 2014-11-21 (×2): qty 2

## 2014-11-21 MED ORDER — DIVALPROEX SODIUM 250 MG PO DR TAB
500.0000 mg | DELAYED_RELEASE_TABLET | Freq: Two times a day (BID) | ORAL | Status: DC
  Administered 2014-11-21: 500 mg via ORAL
  Administered 2014-11-21: 1000 mg via ORAL
  Administered 2014-11-22: 500 mg via ORAL
  Administered 2014-11-22 – 2014-11-23 (×2): 1000 mg via ORAL
  Administered 2014-11-23: 500 mg via ORAL
  Administered 2014-11-24: 1000 mg via ORAL
  Administered 2014-11-24 – 2014-11-25 (×2): 500 mg via ORAL
  Administered 2014-11-25: 1000 mg via ORAL
  Administered 2014-11-26 – 2014-11-27 (×2): 500 mg via ORAL
  Filled 2014-11-21 (×5): qty 2
  Filled 2014-11-21: qty 4
  Filled 2014-11-21 (×5): qty 2
  Filled 2014-11-21: qty 4

## 2014-11-21 MED ORDER — METHYLPHENIDATE HCL 5 MG PO TABS
10.0000 mg | ORAL_TABLET | Freq: Two times a day (BID) | ORAL | Status: DC
  Administered 2014-11-22 – 2014-11-27 (×11): 10 mg via ORAL
  Filled 2014-11-21 (×11): qty 2

## 2014-11-21 MED ORDER — ATORVASTATIN CALCIUM 40 MG PO TABS
80.0000 mg | ORAL_TABLET | Freq: Every day | ORAL | Status: DC
  Administered 2014-11-21 – 2014-11-25 (×5): 80 mg via ORAL
  Filled 2014-11-21 (×8): qty 2

## 2014-11-21 MED ORDER — PANTOPRAZOLE SODIUM 40 MG PO TBEC
40.0000 mg | DELAYED_RELEASE_TABLET | Freq: Every day | ORAL | Status: DC
  Administered 2014-11-21 – 2014-11-27 (×7): 40 mg via ORAL
  Filled 2014-11-21 (×7): qty 1

## 2014-11-21 MED ORDER — LISINOPRIL 5 MG PO TABS
2.5000 mg | ORAL_TABLET | Freq: Every day | ORAL | Status: DC
  Administered 2014-11-21 – 2014-11-27 (×7): 2.5 mg via ORAL
  Filled 2014-11-21 (×7): qty 1

## 2014-11-21 MED ORDER — METHYLPHENIDATE HCL ER (OSM) 36 MG PO TBCR: 36.0000 mg | EXTENDED_RELEASE_TABLET | Freq: Every morning | ORAL | Status: DC

## 2014-11-21 MED ORDER — INSULIN GLARGINE 100 UNIT/ML ~~LOC~~ SOLN
75.0000 [IU] | Freq: Every day | SUBCUTANEOUS | Status: DC
  Administered 2014-11-21 – 2014-11-22 (×2): 75 [IU] via SUBCUTANEOUS
  Filled 2014-11-21 (×8): qty 0.75

## 2014-11-21 MED ORDER — ATENOLOL 25 MG PO TABS
50.0000 mg | ORAL_TABLET | Freq: Every day | ORAL | Status: DC
Start: 2014-11-21 — End: 2014-11-27
  Administered 2014-11-21 – 2014-11-27 (×7): 50 mg via ORAL
  Filled 2014-11-21 (×7): qty 2

## 2014-11-21 MED ORDER — IMIPRAMINE HCL 10 MG PO TABS
40.0000 mg | ORAL_TABLET | Freq: Every day | ORAL | Status: DC
  Administered 2014-11-21: 30 mg via ORAL
  Administered 2014-11-22 – 2014-11-25 (×4): 40 mg via ORAL
  Filled 2014-11-21 (×8): qty 4

## 2014-11-21 NOTE — ED Notes (Signed)
Pt requesting to take a shower. Food trays have not arrived. NT to escort pt to shower. nad noted.

## 2014-11-21 NOTE — Progress Notes (Signed)
Inpatient Diabetes Program Recommendations  AACE/[name]: New Consensus Statement on Inpatient Glycemic Control (2013)  Target Ranges:  Prepandial:   less than 140 mg/dL      Peak postprandial:   less than 180 mg/dL (1-2 hours)      Critically ill patients:  140 - 180 mg/dL   Results for Alexandria HaberBRACKEN, Shoshanah (MRN [id number]) as of 11/21/2014 12:02  Ref. Range 11/20/2014 09:33 11/20/2014 12:58 11/20/2014 18:14 11/21/2014 07:37  Glucose-Capillary Latest Range: 70-99 mg/dL 409274 (H) 811201 (H) 914197 (H) 208 (H)    Diabetes history: DM2 Outpatient Diabetes medications: Lantus 75 units QHS, Humalog 37 units with breakfast and lunch, Humalog 20 units with supper, Metformin 1000 mg BID Current orders for Inpatient glycemic control: Lantus 75 units QHS, Metformin 1000 mg BID  Inpatient Diabetes Program Recommendations Correction (SSI): Please ordering CBGs with Novolog moderate correction scale TID with meals and at bedtime.  Thanks, Orlando PennerMarie Day Greb, RN, MSN, CCRN, CDE Diabetes Coordinator Inpatient Diabetes Program [phone] (Team Pager) [phone] ([facility] office) [phone] [facility] office)

## 2014-11-21 NOTE — Progress Notes (Signed)
CSW attempted to contact Ma RingsHarold Carelock to see if he could re send IQ and guardianship paperwork as they were not received last night. [name] did not answer and voicemail is full so Clinical research associatewriter was unable to leave a message.  Chad CordialLauren Carter, LCSWA 11/21/2014 11:20 AM

## 2014-11-21 NOTE — ED Notes (Signed)
Diabetes Coordinator called and requested pt be started on correction scale instead of Humalog. EDP aware and reported would place order.

## 2014-11-21 NOTE — ED Notes (Signed)
Consulted pharmacy concerning pt dose of Concerta. Medication not stocked in [facility]. Pharmacy reports have other brands of methylphenidate. No medications found in pt belongings.

## 2014-11-21 NOTE — ED Notes (Addendum)
EDP aware that Concerta not stocked at [facility]. EDP reported okay for pt to be dosed an equivalent medication that is stocked. Pharmacy aware, reported would change to equivalent.

## 2014-11-21 NOTE — ED Notes (Signed)
Pt ambulated to bathroom with steady gait with sitter.

## 2014-11-21 NOTE — ED Notes (Signed)
Pt is sleeping

## 2014-11-21 NOTE — ED Notes (Addendum)
Sarah for [facility] called to give following information:  Care Coordinator for Miss Esperanza SheetsBracken is Page SpiroMartha Davidson [phone]

## 2014-11-21 NOTE — Progress Notes (Addendum)
CSW contacted Alexandria Harvey again today to inquire if patient's guardianship and IQ paperwork have been faxed.   Alexandria Harvey stated that he will inform his co-worker and make sure that these documents will be [facility].  Writer gave Alexandria Harvey the [facility] fax#s: [phone] and [phone].  Writer still awaiting guardianship and IQ papers from the group home [name]).  As of 11:26pm these documents were not received by CSW.  Will continue to follow up.  Alexandria Harvey, LCSWA Disposition staff 11/21/2014 5:02 PM

## 2014-11-22 DIAGNOSIS — F911 Conduct disorder, childhood-onset type: Secondary | ICD-10-CM

## 2014-11-22 DIAGNOSIS — R4689 Other symptoms and signs involving appearance and behavior: Secondary | ICD-10-CM | POA: Insufficient documentation

## 2014-11-22 LAB — CBG MONITORING, ED
GLUCOSE-CAPILLARY: 114 mg/dL — AB (ref 70–99)
Glucose-Capillary: 120 mg/dL — ABNORMAL HIGH (ref 70–99)
Glucose-Capillary: 143 mg/dL — ABNORMAL HIGH (ref 70–99)
Glucose-Capillary: 143 mg/dL — ABNORMAL HIGH (ref 70–99)

## 2014-11-22 MED ORDER — ACETAMINOPHEN 325 MG PO TABS
650.0000 mg | ORAL_TABLET | Freq: Once | ORAL | Status: AC
  Administered 2014-11-22: 650 mg via ORAL
  Filled 2014-11-22: qty 2
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
## 2014-11-22 NOTE — BH Assessment (Addendum)
CSW attempted to contact pt's group home "[facility]" staff, [name], at the number provided, in order to communicate that patient has been cleared for d/c back to group home per Lloyd HugerNeil, NP, and to inquire as to whether they are planning for patient to return to group home. No answer, and voicemail is full.   Chad CordialLauren Carter MSW, LCSWA 11/22/14 14:59

## 2014-11-22 NOTE — ED Notes (Signed)
Pt ambulated to bathroom with sitter
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
## 2014-11-22 NOTE — Progress Notes (Signed)
Writer attempted to reach patient's IDD care coordinator, B. Mayford KnifeWilliams [phone]), and left a voicemail.   Will continue to follow up.  Alexandria Harvey, LCSWA Disposition staff 11/22/2014 5:30 PM

## 2014-11-22 NOTE — ED Notes (Signed)
TSS re-eval done.  

## 2014-11-23 LAB — CBG MONITORING, ED
GLUCOSE-CAPILLARY: 93 mg/dL (ref 70–99)
GLUCOSE-CAPILLARY: 103 mg/dL — AB (ref 70–99)
Glucose-Capillary: 108 mg/dL — ABNORMAL HIGH (ref 70–99)
Glucose-Capillary: 111 mg/dL — ABNORMAL HIGH (ref 70–99)

## 2014-11-23 MED ORDER — ACETAMINOPHEN 325 MG PO TABS: 650.0000 mg | ORAL_TABLET | Freq: Three times a day (TID) | ORAL | Status: DC | PRN

## 2014-11-23 MED ORDER — ACETAMINOPHEN 325 MG PO TABS
650.0000 mg | ORAL_TABLET | Freq: Once | ORAL | Status: AC
  Administered 2014-11-23: 650 mg via ORAL
  Filled 2014-11-23: qty 2

## 2014-11-23 NOTE — ED Provider Notes (Signed)
Pt is awaiting group home placement at this time   Alexandria Rhineonald Kamoria Lucien, MD 11/23/14 [phone]

## 2014-11-23 NOTE — Clinical Social Work Note (Signed)
CSW faxed [facility]. FL2 also sent to IDD care coordinator to begin new search.   Derenda FennelKara Alessia Gonsalez, [place] [phone]

## 2014-11-23 NOTE — ED Notes (Signed)
Pt ambulated to restroom at this time.

## 2014-11-23 NOTE — Clinical Social Work Note (Signed)
CSW has left another voicemail for both IDD care coordinator and guardian.  Derenda FennelKara Marselino Slayton, [place] [phone]

## 2014-11-23 NOTE — ED Notes (Signed)
Pt to restroom at this time. Tech outside door monitoring

## 2014-11-23 NOTE — ED Notes (Signed)
Pt ambulated to bathroom with sitter

## 2014-11-23 NOTE — Clinical Social Work Note (Signed)
Received voicemail from pt's guardian Roxanne who reports she has been updated by IDD care coordinator and [facility]. Attempted to reach Roxanne again, but left voicemail. Discussed case with supervisor.   Derenda FennelKara Caiden Monsivais, [place] [phone]

## 2014-11-23 NOTE — Clinical Social Work Note (Signed)
CSW received call from Alexandria Harvey, IDD care coordinator with [facility]. She was not aware that pt had been psychiatrically cleared. CSW notified her that [facility] feels pt requires additional services and that she is not appropriate to return there. Thedosia plans to discuss with her supervisor regarding new placement and will call CSW to update.  Derenda FennelKara Deneshia Zucker, [place] [phone]

## 2014-11-23 NOTE — ED Provider Notes (Signed)
Pt resting comfortably Awaiting placement Labs reviewed BP 116/78 mmHg  Pulse 98  Temp(Src) 98.8 F (37.1 C) (Oral)  Resp 18  Ht 5' 2.5" (1.588 m)  Wt 194 lb 1.6 oz (88.043 kg)  BMI 34.91 kg/m2  SpO2 100%   Zadie Rhineonald Seba Madole, MD 11/23/14 [phone]

## 2014-11-24 LAB — CBG MONITORING, ED
Glucose-Capillary: 95 mg/dL (ref 70–99)
Glucose-Capillary: 100 mg/dL — ABNORMAL HIGH (ref 70–99)
Glucose-Capillary: 177 mg/dL — ABNORMAL HIGH (ref 70–99)
Glucose-Capillary: 128 mg/dL — ABNORMAL HIGH (ref 70–99)
Glucose-Capillary: 93 mg/dL (ref 70–99)

## 2014-11-24 NOTE — ED Provider Notes (Signed)
Awaiting group home placement. Pt stable.   Alexandria JesterKathleen Ronda Rajkumar, DO 11/24/14 [phone]

## 2014-11-25 LAB — CBG MONITORING, ED
Glucose-Capillary: 108 mg/dL — ABNORMAL HIGH (ref 70–99)
Glucose-Capillary: 141 mg/dL — ABNORMAL HIGH (ref 70–99)
Glucose-Capillary: 98 mg/dL (ref 70–99)
Glucose-Capillary: 92 mg/dL (ref 70–99)

## 2014-11-25 NOTE — ED Provider Notes (Signed)
Patient sleeping during AM rounds.  Patient awaiting placement.  Alexandria Munchobert Dennie Moltz, MD 11/25/14 1034

## 2014-11-25 NOTE — Progress Notes (Signed)
CSW called and left voicemails for call back with patient guardian as well as IDD Coordinator in order to obtain an update on patient placement and to see how CSW could assist with placement.    Adelene AmasEdith Jody Silas, LCSW Disposition Social Worker [phone]

## 2014-11-26 LAB — CBG MONITORING, ED
GLUCOSE-CAPILLARY: 146 mg/dL — AB (ref 70–99)
GLUCOSE-CAPILLARY: 101 mg/dL — AB (ref 70–99)
Glucose-Capillary: 99 mg/dL (ref 70–99)
Glucose-Capillary: 110 mg/dL — ABNORMAL HIGH (ref 70–99)

## 2014-11-26 NOTE — Clinical Social Work Note (Signed)
CSW has left voicemail for Camino Tassajarahedosia, IDD care coordinator requesting return call about disposition.   Derenda FennelKara Kimarie Coor, [place] [phone]

## 2014-11-26 NOTE — ED Notes (Signed)
Pt lying in bed watching TV. 

## 2014-11-26 NOTE — Clinical Social Work Note (Signed)
Spoke with Heide Sparkhedosia and she plans to call again after [facility] discusses plan if pt can return with additional services in place.  Derenda FennelKara Chara Marquard, [place] [phone]

## 2014-11-27 LAB — CBG MONITORING, ED
Glucose-Capillary: 115 mg/dL — ABNORMAL HIGH (ref 70–99)
Glucose-Capillary: 123 mg/dL — ABNORMAL HIGH (ref 70–99)
Glucose-Capillary: 145 mg/dL — ABNORMAL HIGH (ref 70–99)
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
## 2014-11-27 NOTE — ED Notes (Signed)
Nurse called DSS regarding no response to patient placement from [facility]. Supervisor from adult protective service to call nurse back
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
## 2014-11-27 NOTE — Clinical Social Work Note (Signed)
CSW has left 2 voicemails for Heide Sparkhedosia, IDD care coordinator and a voicemail for [facility] to receive information on outcome of meeting yesterday.  Derenda FennelKara Maryhelen Lindler, [place] [phone]

## 2014-11-27 NOTE — Discharge Instructions (Signed)
Aggression °Physically aggressive behavior is common among small children. When frustrated or angry, toddlers may act out. Often, they will push, bite, or hit. Most children show less physical aggression as they grow up. Their language and interpersonal skills improve, too. But continued aggressive behavior is a sign of a problem. This behavior can lead to aggression and delinquency in adolescence and adulthood. °Aggressive behavior can be psychological or physical. Forms of psychological aggression include threatening or bullying others. Forms of physical aggression include:  °· Pushing. °· Hitting. °· Slapping. °· Kicking. °· Stabbing. °· Shooting. °· Raping.  °PREVENTION  °Encouraging the following behaviors can help manage aggression: °· Respecting others and valuing differences. °· Participating in school and community functions, including sports, music, after-school programs, community groups, and volunteer work. °· Talking with an adult when they are sad, depressed, fearful, anxious, or angry. Discussions with a parent or other family member, counselor, teacher, or coach can help. °· Avoiding alcohol and drug use. °· Dealing with disagreements without aggression, such as conflict resolution. To learn this, children need parents and caregivers to model respectful communication and problem solving. °· Limiting exposure to aggression and violence, such as video games that are not age appropriate, violence in the media, or domestic violence. °Document Released: 06/21/2007 Document Revised: 11/16/2011 Document Reviewed: 10/30/2010 °ExitCare® Patient Information ©2015 [employer]. This information is not intended to replace advice given to you by your health care provider. Make sure you discuss any questions you have with your health care provider. ° ° °Emergency Department Resource Guide °1) Find a Doctor and Pay Out of Pocket °Although you won't have to find out who is covered by your insurance plan, it is a  good idea to ask around and get recommendations. You will then need to call the office and see if the doctor you have chosen will accept you as a new patient and what types of options they offer for patients who are self-pay. Some doctors offer discounts or will set up payment plans for their patients who do not have insurance, but you will need to ask so you aren't surprised when you get to your appointment. ° °2) Contact Your Local Health Department °Not all health departments have doctors that can see patients for sick visits, but many do, so it is worth a call to see if yours does. If you don't know where your local health department is, you can check in your phone book. The CDC also has a tool to help you locate your state's health department, and many state websites also have listings of all of their local health departments. ° °3) Find a Walk-in Clinic °If your illness is not likely to be very severe or complicated, you may want to try a walk in clinic. These are popping up all over the country in pharmacies, drugstores, and shopping centers. They're usually staffed by nurse practitioners or physician assistants that have been trained to treat common illnesses and complaints. They're usually fairly quick and inexpensive. However, if you have serious medical issues or chronic medical problems, these are probably not your best option. ° °No Primary Care Doctor: °- Call Health Connect at  832-8000 - they can help you locate a primary care doctor that  accepts your insurance, provides certain services, etc. °- Physician Referral Service- 1-800-533-3463 ° °Chronic Pain Problems: °Organization         Address  Phone   Notes  °[facility]  ([phone]) [phone] Patients need to be referred by   by their primary care doctor.   Medication Assistance: Organization         Address  Phone   Notes  [facility] [address] [place], [place] [address] [phone])  [phone] --Must be a resident of [facility] [place] -- Must have NO insurance coverage whatsoever (no Medicaid/ Medicare, etc.) -- The pt. MUST have a primary care doctor that directs their care regularly and follows them in the community   [facility]  [phone]   [employer]  [phone]    Agencies that provide inexpensive medical care: Organization         Address  Phone   Notes  [facility]  [phone]   [facility]    [phone]   [facility] [address] [place], [place] [address] [phone]) [phone]   [facility] [address], [place] [phone]   [facility]    [phone](336) [phone]   [facility]    [phone]   [facility]  [address], [place] Phone:  [phone]) [phone], Fax:  [phone] Hours of Operation:  9 am - 6 pm, M-F.  Also accepts Medicaid/Medicare and self-pay.  [facility]  [address], [place] Phone: [phone]) [phone], Fax: [phone]. Hours of Operation:  8:30 am - 5:30 pm, M-F.  Also accepts Medicaid and self-pay.  [facility] [place] [address], [place] Phone: [phone]) [phone]   [facility] [address], [place], [place] [phone], Ext. [phone] Mondays & Thursdays: 7-9 AM.  First 15 patients are seen on a first come, first serve basis.    Medicaid-accepting [facility] Providers:  Organization         Address  Phone   Notes  [facility] [address], [place] [phone] Also accepts self-pay patients.  [facility] [address], [place]  [phone]   [facility] [address], [place] [phone]   [facility] [address] [place] [address], [place] [phone]   Renaye RakersVeita Bland [address], [place]   [phone])  [phone] Only accepts [place] Access [place] patients after they have their name applied to their card.   Self-Pay (no insurance) in [facility]:  Organization         Address  Phone   Notes  Sickle Cell Patients, [facility] [address], [place] [phone]) [phone]   [facility] [address], [place] [phone]) [phone]   [facility]  [address], [place] [phone]   [facility]/Dr. Osei-Bonsu  [address] [place] [address], [place] or [phone], [place] [phone] Phone number for both [place] and [place] locations is the same.  [facility] [address], [place] [phone]) [phone]   [facility] [place] [address] [place] [address], [place] or [address] [phone]) [phone]   [facility] [address], [place] [phone]) [phone], phone; [phone](336) [phone], fax Sees patients 1st and 3rd Saturday of every month.  Must not qualify for public or private insurance (i.e. Medicaid, Medicare, [place] Health Choice, Veterans' Benefits)  Household income should be no more  200% of the poverty level •The clinic cannot treat you if you are pregnant or think you are pregnant • Sexually transmitted diseases are not treated at the clinic.  ° ° °Dental Care: °Organization         Address  Phone  Notes  °[facility] [address], [place] [phone] Accepts children up to age 21 who are enrolled in Medicaid or [place] Health Choice; pregnant women with a Medicaid card; and children who have applied for Medicaid or [place] Health Choice, but were declined, whose parents can pay a reduced fee at time of service.  °[facility] [place]  [address], [place] [phone]) [phone] Accepts children up to age 21 who are enrolled in Medicaid or [place] Health  Choice; pregnant women with a Medicaid card; and children who have applied for Medicaid or [place] Health Choice, but were declined, whose parents can pay a reduced fee at time of service.  °[facility] ° [address], [place] [phone]) [phone] Patients are seen by appointment only. Walk-ins are not accepted. [facility] will see patients 18 years of age and older. °Monday - Tuesday (8am-5pm) °Most Wednesdays (8:30-5pm) °$30 per visit, cash only  °[facility] ° [address], [place] [phone]) [phone] Patients are seen by appointment only. Walk-ins are not accepted. [facility] will see patients 18 years of age and older. °One Wednesday Evening (Monthly: Volunteer Based).  $30 per visit, cash only  °[facility]  ([phone] for adults; Children under age 4, call Graduate Pediatric Dentistry at [phone]. Children aged 4-14, please call [phone] to request a pediatric application. ° Dental services are provided in all areas of dental care including fillings, crowns and bridges, complete and partial dentures, implants, gum treatment, root canals, and extractions. Preventive care is also provided. Treatment is provided to both adults and children. °Patients are selected via a lottery and there is often a waiting list. °  °[facility] [address], °[place] ° (336) 763-8833 [email or web address] °  °[facility] [address], [place], [place] ([phone], Ext. [phone] Second and Fourth Thursday of each month, opens at 6:30 AM; Clinic ends at 9 AM.  Patients are seen on a first-come first-served basis, and a limited number are seen during each clinic.  ° °[facility] ° [address], [place], [place] [phone]) [phone]   Eligibility Requirements °You must have lived in [place], [place], or [place] counties for at least the last three months. °  You cannot be eligible for state or  federal sponsored healthcare insurance, including Veterans Administration, Medicaid, or Medicare. °  You generally cannot be eligible for healthcare insurance through your employer.  °  How to apply: °Eligibility screenings are held every Tuesday and Wednesday afternoon from 1:00 pm until 4:00 pm. You do not need an appointment for the interview!  °[facility] [address], [place], [place] [phone]   °[facility]  336-342-8273   °[facility]  336-703-3100   °[facility]  336-570-6415   ° °Behavioral Health Resources in the Community: °Intensive Outpatient Programs °Organization         Address  Phone  Notes  °[facility] [address], [place], [place] 336-878-6098   °[facility]   Outpatient [address], [place], [place] 336-832-9800   °[facility] [address], [place], [place] ° 336-882-2125   °[facility] [address],  °[place], [place] 1-800-853-5163 or 336-641-4981   °Substance Abuse Resources °Organization         Address  Phone  Notes  °[facility]  336-882-2125   °[facility]  336-784-9470   °[facility]  336-285-9073   °[facility]  336-845-3988   °Residential & Outpatient Substance Abuse Program  1-800-659-3381   °Psychological Services °Organization         Address  Phone  Notes  °[facility]  336- 832-9600   °[facility]  336- 378-7881   °[facility] [address], [place] 1-800-853-5163 or 336-641-4981   ° °Mobile Crisis Teams °Organization         Address  Phone  Notes  °[facility], Mobile Crisis Care Unit  1-877-626-1772   °Assertive °[facility] Services ° [address] [place], [place] 336-834-9664   °Sharon DeEsch [address] °[place] 336-554-5454   ° °Self-Help/Support Groups °Organization         Address  Phone              Notes  °[facility] [place] - variety of support groups  336- 373-1402 Call for more information  °Narcotics Anonymous (NA), [facility] [address], °[place]  2 meetings at this location  ° °Residential Treatment Programs °Organization         Address  Phone  Notes  °[facility] [address],    °[place]  1-866-801-8205   °[facility] ° [address], [place], [place] 704-293-8524   °[facility] [address], [place] 336-845-3988 Admissions: 8am-3pm M-F  °[facility] [address]    °[place], [place] 336-841-1104   °[facility] [address], [place], [place] 336-379-7146   °[facility] [address]  °[place], [place] 336-285-9073   °[facility] - Intensive Outpatient [address], [place], [place] 336-852-3033   °[facility] [address]  °[place], [place] 1-877-615-2722 or 336-784-9470   °[facility] ([facility]) [address] [place], [place] 336-227-7417 Accepts Medicaid  °[facility] [address]  ° [place] 1-800-659-3381 Substance Abuse/Addiction Treatment  ° °[place] [facility] Health Resources °Organization         Address  Phone  Notes  °[facility]  (888) 581-9988   °Julie Brannon, PhD [address] [place], [place]   (336) 349-5553 or (336) 951-0000   °[facility]   [address] °[place], [place] (336) 349-4454   °[facility] [address], [place], [place] (336) 342-8316 Insurance/Medicaid/sponsorship through [facility]  °[facility] [address]                                    [place], [place] (336) 342-8316 Therapy/tele-psych/case  °[facility] [address]  ° [place],  (336) 349-2233    °Dr. Arfeen  (336) 349-4544   °[facility] [place]  [employer] [facility] [facility]  Dept. 1) [address], [place] °2) [address], [place] °3)  [address], [place] [phone]  646-493-6149   [facility] Child Abuse Hotline 848-800-1241 or 225-269-7861 (After Hours)

## 2014-11-30 ENCOUNTER — Emergency Department (HOSPITAL_COMMUNITY): Payer: Medicare Other

## 2014-11-30 ENCOUNTER — Encounter (HOSPITAL_COMMUNITY): Payer: Self-pay

## 2014-11-30 ENCOUNTER — Observation Stay (HOSPITAL_COMMUNITY)
Admission: EM | Admit: 2014-11-30 | Discharge: 2014-12-04 | Payer: Medicare Other | Attending: Family Medicine | Admitting: Family Medicine

## 2014-11-30 DIAGNOSIS — E160 Drug-induced hypoglycemia without coma: Secondary | ICD-10-CM | POA: Diagnosis present

## 2014-11-30 DIAGNOSIS — F259 Schizoaffective disorder, unspecified: Secondary | ICD-10-CM | POA: Insufficient documentation

## 2014-11-30 DIAGNOSIS — R4182 Altered mental status, unspecified: Secondary | ICD-10-CM

## 2014-11-30 DIAGNOSIS — R569 Unspecified convulsions: Secondary | ICD-10-CM

## 2014-11-30 DIAGNOSIS — G40909 Epilepsy, unspecified, not intractable, without status epilepticus: Secondary | ICD-10-CM | POA: Insufficient documentation

## 2014-11-30 DIAGNOSIS — E875 Hyperkalemia: Secondary | ICD-10-CM | POA: Insufficient documentation

## 2014-11-30 DIAGNOSIS — E669 Obesity, unspecified: Secondary | ICD-10-CM | POA: Insufficient documentation

## 2014-11-30 DIAGNOSIS — I10 Essential (primary) hypertension: Secondary | ICD-10-CM | POA: Diagnosis present

## 2014-11-30 DIAGNOSIS — E11649 Type 2 diabetes mellitus with hypoglycemia without coma: Secondary | ICD-10-CM | POA: Insufficient documentation

## 2014-11-30 DIAGNOSIS — G9340 Encephalopathy, unspecified: Principal | ICD-10-CM | POA: Diagnosis present

## 2014-11-30 DIAGNOSIS — T383X5A Adverse effect of insulin and oral hypoglycemic [antidiabetic] drugs, initial encounter: Secondary | ICD-10-CM

## 2014-11-30 DIAGNOSIS — R000 Tachycardia, unspecified: Secondary | ICD-10-CM

## 2014-11-30 DIAGNOSIS — E162 Hypoglycemia, unspecified: Secondary | ICD-10-CM | POA: Diagnosis present

## 2014-11-30 DIAGNOSIS — Z6832 Body mass index (BMI) 32.0-32.9, adult: Secondary | ICD-10-CM | POA: Insufficient documentation

## 2014-11-30 DIAGNOSIS — E785 Hyperlipidemia, unspecified: Secondary | ICD-10-CM | POA: Insufficient documentation

## 2014-11-30 DIAGNOSIS — Z794 Long term (current) use of insulin: Secondary | ICD-10-CM | POA: Insufficient documentation

## 2014-11-30 DIAGNOSIS — T50901A Poisoning by unspecified drugs, medicaments and biological substances, accidental (unintentional), initial encounter: Secondary | ICD-10-CM | POA: Diagnosis present

## 2014-11-30 HISTORY — DX: Unspecified convulsions: R56.9

## 2014-11-30 LAB — URINALYSIS, ROUTINE W REFLEX MICROSCOPIC
BILIRUBIN URINE: NEGATIVE
Glucose, UA: 500 mg/dL — AB
HGB URINE DIPSTICK: NEGATIVE
Ketones, ur: 15 mg/dL — AB
Leukocytes, UA: NEGATIVE
Nitrite: NEGATIVE
PH: 6 (ref 5.0–8.0)
Protein, ur: NEGATIVE mg/dL
SPECIFIC GRAVITY, URINE: 1.034 — AB (ref 1.005–1.030)
Urobilinogen, UA: 0.2 mg/dL (ref 0.0–1.0)

## 2014-11-30 LAB — COMPREHENSIVE METABOLIC PANEL
ALT: 18 U/L (ref 0–35)
AST: 23 U/L (ref 0–37)
Albumin: 2.8 g/dL — ABNORMAL LOW (ref 3.5–5.2)
Alkaline Phosphatase: 58 U/L (ref 39–117)
Anion gap: 7 (ref 5–15)
BUN: 9 mg/dL (ref 6–23)
CO2: 28 mmol/L (ref 19–32)
Calcium: 8.9 mg/dL (ref 8.4–10.5)
Chloride: 106 mmol/L (ref 96–112)
Creatinine, Ser: 0.58 mg/dL (ref 0.50–1.10)
GFR calc Af Amer: >90 mL/min (ref >90)
GFR calc non Af Amer: >90 mL/min (ref >90)
Glucose, Bld: 130 mg/dL — ABNORMAL HIGH (ref 70–99)
POTASSIUM: 3.7 mmol/L (ref 3.5–5.1)
SODIUM: 141 mmol/L (ref 135–145)
TOTAL PROTEIN: 6.7 g/dL (ref 6.0–8.3)
Total Bilirubin: 0.4 mg/dL (ref 0.3–1.2)

## 2014-11-30 LAB — BASIC METABOLIC PANEL
Anion gap: 12 (ref 5–15)
BUN: 15 mg/dL (ref 6–23)
CO2: 23 mmol/L (ref 19–32)
Calcium: 9.5 mg/dL (ref 8.4–10.5)
Chloride: 103 mmol/L (ref 96–112)
Creatinine, Ser: 0.71 mg/dL (ref 0.50–1.10)
GFR calc Af Amer: >90 mL/min (ref >90)
GLUCOSE: 55 mg/dL — AB (ref 70–99)
POTASSIUM: 5.1 mmol/L (ref 3.5–5.1)
Sodium: 138 mmol/L (ref 135–145)

## 2014-11-30 LAB — CBC WITH DIFFERENTIAL/PLATELET
Basophils Absolute: 0.1 10*3/uL (ref 0.0–0.1)
Basophils Relative: 1 % (ref 0–1)
EOS PCT: 1 % (ref 0–5)
Eosinophils Absolute: 0.1 10*3/uL (ref 0.0–0.7)
HEMATOCRIT: 39.7 % (ref 36.0–46.0)
Hemoglobin: 12.7 g/dL (ref 12.0–15.0)
LYMPHS ABS: 3.5 10*3/uL (ref 0.7–4.0)
Lymphocytes Relative: 34 % (ref 12–46)
MCH: 27.3 pg (ref 26.0–34.0)
MCHC: 32 g/dL (ref 30.0–36.0)
MCV: 85.2 fL (ref 78.0–100.0)
MONO ABS: 1.1 10*3/uL — AB (ref 0.1–1.0)
Monocytes Relative: 11 % (ref 3–12)
NEUTROS ABS: 5.5 10*3/uL (ref 1.7–7.7)
Neutrophils Relative %: 53 % (ref 43–77)
Platelets: 238 10*3/uL (ref 150–400)
RBC: 4.66 MIL/uL (ref 3.87–5.11)
RDW: 17.1 % — ABNORMAL HIGH (ref 11.5–15.5)
WBC: 10.3 10*3/uL (ref 4.0–10.5)

## 2014-11-30 LAB — ACETAMINOPHEN LEVEL

## 2014-11-30 LAB — CBG MONITORING, ED
GLUCOSE-CAPILLARY: 90 mg/dL (ref 70–99)
GLUCOSE-CAPILLARY: 63 mg/dL — AB (ref 70–99)
Glucose-Capillary: 50 mg/dL — ABNORMAL LOW (ref 70–99)
Glucose-Capillary: 140 mg/dL — ABNORMAL HIGH (ref 70–99)
Glucose-Capillary: 85 mg/dL (ref 70–99)
Glucose-Capillary: 33 mg/dL — CL (ref 70–99)
Glucose-Capillary: 90 mg/dL (ref 70–99)
Glucose-Capillary: 70 mg/dL (ref 70–99)
Glucose-Capillary: 111 mg/dL — ABNORMAL HIGH (ref 70–99)

## 2014-11-30 LAB — I-STAT CHEM 8, ED
BUN: 19 mg/dL (ref 6–23)
CALCIUM ION: 1.18 mmol/L (ref 1.12–1.23)
CREATININE: 0.7 mg/dL (ref 0.50–1.10)
Chloride: 106 mmol/L (ref 96–112)
GLUCOSE: 57 mg/dL — AB (ref 70–99)
HCT: 43 % (ref 36.0–46.0)
HEMOGLOBIN: 14.6 g/dL (ref 12.0–15.0)
Potassium: 5.3 mmol/L — ABNORMAL HIGH (ref 3.5–5.1)
Sodium: 139 mmol/L (ref 135–145)
TCO2: 22 mmol/L (ref 0–100)

## 2014-11-30 LAB — CBC
HCT: 39.8 % (ref 36.0–46.0)
Hemoglobin: 13.1 g/dL (ref 12.0–15.0)
MCH: 27.6 pg (ref 26.0–34.0)
MCHC: 32.9 g/dL (ref 30.0–36.0)
MCV: 83.8 fL (ref 78.0–100.0)
PLATELETS: 257 10*3/uL (ref 150–400)
RBC: 4.75 MIL/uL (ref 3.87–5.11)
RDW: 17 % — AB (ref 11.5–15.5)
WBC: 9.1 10*3/uL (ref 4.0–10.5)

## 2014-11-30 LAB — GLUCOSE, CAPILLARY
GLUCOSE-CAPILLARY: 113 mg/dL — AB (ref 70–99)
Glucose-Capillary: 97 mg/dL (ref 70–99)
Glucose-Capillary: 101 mg/dL — ABNORMAL HIGH (ref 70–99)

## 2014-11-30 LAB — AMMONIA: Ammonia: 34 umol/L — ABNORMAL HIGH (ref 11–32)

## 2014-11-30 LAB — VALPROIC ACID LEVEL: VALPROIC ACID LVL: 78.5 ug/mL (ref 50.0–100.0)

## 2014-11-30 LAB — SALICYLATE LEVEL: Salicylate Lvl: <4 mg/dL (ref 2.8–20.0)

## 2014-11-30 LAB — POC URINE PREG, ED: Preg Test, Ur: NEGATIVE

## 2014-11-30 MED ORDER — DEXTROSE 50 % IV SOLN
INTRAVENOUS | Status: AC
  Filled 2014-11-30: qty 50

## 2014-11-30 MED ORDER — DEXTROSE 50 % IV SOLN
1.0000 | Freq: Once | INTRAVENOUS | Status: AC
  Administered 2014-11-30: 50 mL via INTRAVENOUS

## 2014-11-30 MED ORDER — HEPARIN SODIUM (PORCINE) 5000 UNIT/ML IJ SOLN
5000.0000 [IU] | Freq: Three times a day (TID) | INTRAMUSCULAR | Status: DC
Start: 2014-11-30 — End: 2014-12-04
  Administered 2014-11-30 – 2014-12-04 (×13): 5000 [IU] via SUBCUTANEOUS
  Filled 2014-11-30 (×15): qty 1

## 2014-11-30 MED ORDER — DEXTROSE 10 % IV SOLN
INTRAVENOUS | Status: DC
  Administered 2014-11-30 – 2014-12-01 (×2): via INTRAVENOUS

## 2014-11-30 MED ORDER — SODIUM CHLORIDE 0.9 % IJ SOLN
3.0000 mL | Freq: Two times a day (BID) | INTRAMUSCULAR | Status: DC
  Administered 2014-11-30 – 2014-12-03 (×7): 3 mL via INTRAVENOUS

## 2014-11-30 MED ORDER — DEXTROSE 50 % IV SOLN
50.0000 mL | Freq: Once | INTRAVENOUS | Status: AC
  Administered 2014-11-30: 50 mL via INTRAVENOUS

## 2014-11-30 MED ORDER — DIVALPROEX SODIUM 250 MG PO DR TAB
500.0000 mg | DELAYED_RELEASE_TABLET | Freq: Two times a day (BID) | ORAL | Status: DC
  Filled 2014-11-30: qty 4

## 2014-11-30 MED ORDER — DIVALPROEX SODIUM 500 MG PO DR TAB
1000.0000 mg | DELAYED_RELEASE_TABLET | Freq: Every day | ORAL | Status: DC
  Administered 2014-11-30 – 2014-12-03 (×4): 1000 mg via ORAL
  Filled 2014-11-30 (×5): qty 2

## 2014-11-30 MED ORDER — DEXTROSE 50 % IV SOLN
INTRAVENOUS | Status: AC
  Administered 2014-11-30: 50 mL via INTRAVENOUS
  Filled 2014-11-30: qty 50

## 2014-11-30 MED ORDER — DIVALPROEX SODIUM 500 MG PO DR TAB
500.0000 mg | DELAYED_RELEASE_TABLET | Freq: Every day | ORAL | Status: DC
  Administered 2014-12-01 – 2014-12-04 (×4): 500 mg via ORAL
  Filled 2014-11-30 (×4): qty 1

## 2014-11-30 NOTE — ED Notes (Signed)
Dr. Plunkett at the bedside.  

## 2014-11-30 NOTE — ED Notes (Addendum)
Pt pulled out of car from front and brought to trauma A. Per caregiver she was brought to her group home yesterday at 1600 and dropped her off from another group home. Pt has been drowsy and sluggish and is diabetic. Pt was able to help staff get her out of car as far as standing and pivoting then slumped in the wheelchair.

## 2014-11-30 NOTE — Progress Notes (Signed)
FPTS Interim Progress Note  S: Patient presented to floor alert and responsive. Went to go reexamine patient.  Patient still mildly lethargic but alert and responding to questions. Patient states that she did take her medications today. However, she is unable to tell me what kind of medications she took and who gave her her medications. She states that she did take insulin as well. Patient says that [facility] . According to patient she believes that she had a small seizure earlier today which resulted in some memory loss. She denies any SI/HI. Also denies hallucinations or hearing voices. She wants to know if she can eat.  O: BP 122/62 mmHg  Pulse 86  Temp(Src) 98.1 F (36.7 C) (Oral)  Resp 15  Ht 5' 2.5" (1.588 m)  Wt 178 lb 5.6 oz (80.9 kg)  BMI 32.08 kg/m2  SpO2 100%   General: alert, responsive, NAD, no eye contact Neuro: A&Ox3, no focal deficits Pscyh: mood appropriate, tangential speech   A/P: Patient still with lethargy. However, improved mental status from ED. Will advance diet at this time. Patient should continue to be monitored on step down unit. CBGs normalizing. Last CBG 111. Will continue D10 solution until patient eating adequately. Vital signs are remaining stable.  Pincus LargeJazma Y Peggyann Zwiefelhofer, DO 11/30/2014, 6:00 PM

## 2014-11-30 NOTE — ED Notes (Signed)
Pt became more alert after dextrose administration.  Pt still speaking incomprehensible words and will follow simple commands.

## 2014-11-30 NOTE — ED Notes (Signed)
CBG 63  

## 2014-11-30 NOTE — ED Provider Notes (Addendum)
CSN: [id number]     Arrival date & time 11/30/14  1152 History   First MD Initiated Contact with Patient 11/30/14 1158     Chief Complaint  Patient presents with  . Altered Mental Status     (Consider location/radiation/quality/duration/timing/severity/associated sxs/prior Treatment) HPI Comments: Only history we are given is provided by a caregiver who states that her family dropped her off at the house this morning and she was unresponsive. She was brought to the emergency room by private vehicle and patient was able to stand when getting out of the car and then slumped into the wheelchair.  Patient is a 34 y.o. female presenting with altered mental status. The history is provided by a friend. The history is limited by the condition of the patient.  Altered Mental Status Presenting symptoms: partial responsiveness   Severity:  Severe Most recent episode:  Today Episode history:  Continuous Timing:  Unable to specify Context comment:  Unknown   Past Medical History  Diagnosis Date  . Mild mental retardation   . Excessive anger   . Schizophrenia   . Hallucinations   . Diabetes mellitus without complication   . Hypertension    History reviewed. No pertinent past surgical history. No family history on file. History  Substance Use Topics  . Smoking status: Never Smoker   . Smokeless tobacco: Not on file  . Alcohol Use: No   OB History    No data available     Review of Systems  Unable to perform ROS     Allergies  Haloperidol lactate and Shrimp  Home Medications   Prior to Admission medications   Medication Sig Start Date End Date Taking? Authorizing Provider  atenolol (TENORMIN) 50 MG tablet Take 50 mg by mouth daily.    Historical Provider, MD  atorvastatin (LIPITOR) 80 MG tablet Take 80 mg by mouth at bedtime.    Historical Provider, MD  divalproex (DEPAKOTE) 500 MG DR tablet Take 500-1,000 mg by mouth 2 (two) times daily. Takes one tablet in the morning and  two tablets at bedtime    Historical Provider, MD  docusate sodium (COLACE) 100 MG capsule Take 100 mg by mouth 2 (two) times daily.    Historical Provider, MD  imipramine (TOFRANIL) 10 MG tablet Take 40 mg by mouth at bedtime.    Historical Provider, MD  insulin glargine (LANTUS) 100 UNIT/ML injection Inject 75 Units into the skin at bedtime.    Historical Provider, MD  insulin lispro (HUMALOG) 100 UNIT/ML injection Inject 20-37 Units into the skin 3 (three) times daily before meals. 37 units given before breakfast and lunch, then 20 units before supper. **To be given within 15 minutes of completion of meals and NOT to be given if patient <50% of meal or pre-prandial glucose is 75mg /dL**    Historical Provider, MD  ipratropium (ATROVENT) 0.03 % nasal spray Place 1 spray under the tongue 2 (two) times daily.     Historical Provider, MD  lisinopril (PRINIVIL,ZESTRIL) 2.5 MG tablet Take 2.5 mg by mouth daily.    Historical Provider, MD  LORazepam (ATIVAN) 1 MG tablet Take 1-2 mg by mouth 2 (two) times daily. Take one tablet twice daily in the morning and in the afternoon and take 2 tablets at bedtime    Historical Provider, MD  medroxyPROGESTERone (DEPO-PROVERA) 150 MG/ML injection Inject 150 mg into the muscle every 3 (three) months.    Historical Provider, MD  metFORMIN (GLUCOPHAGE) 500 MG tablet Take 1,000 mg by  mouth 2 (two) times daily with a meal.    Historical Provider, MD  methylphenidate 36 MG PO CR tablet Take 36 mg by mouth every morning.    Historical Provider, MD  omeprazole (PRILOSEC) 20 MG capsule Take 40 mg by mouth daily.    Historical Provider, MD  selenium sulfide (SELSUN) 2.5 % shampoo Apply 1 application topically every Wednesday.    Historical Provider, MD   BP 81/63 mmHg  Pulse 93  Resp 14  SpO2 100% Physical Exam  Constitutional: She appears well-developed and well-nourished. She appears lethargic. No distress.  Obese  HENT:  Head: Normocephalic and atraumatic.   Mouth/Throat: Oropharynx is clear and moist.  Moist mucous membranes  Eyes: Conjunctivae and EOM are normal. Pupils are equal, round, and reactive to light.  Neck: Normal range of motion. Neck supple.  Cardiovascular: Normal rate, regular rhythm and intact distal pulses.   No murmur heard. Pulmonary/Chest: Effort normal and breath sounds normal. No respiratory distress. She has no wheezes. She has no rales.  Abdominal: Soft. She exhibits no distension. There is no tenderness. There is no rebound and no guarding.  Musculoskeletal: Normal range of motion. She exhibits no edema or tenderness.  Neurological: She appears lethargic.  Pt only mumbles yes and no briefly will open eyes when asked  Skin: Skin is warm and dry. No rash noted. No erythema.  No erythema or evidence of cellulitis  Psychiatric: She has a normal mood and affect. Her behavior is normal.  Nursing note and vitals reviewed.   ED Course  Procedures (including critical care time) Labs Review Labs Reviewed  BASIC METABOLIC PANEL - Abnormal; Notable for the following:    Glucose, Bld 55 (*)    All other components within normal limits  URINALYSIS, ROUTINE W REFLEX MICROSCOPIC - Abnormal; Notable for the following:    Color, Urine AMBER (*)    Specific Gravity, Urine 1.034 (*)    Glucose, UA 500 (*)    Ketones, ur 15 (*)    All other components within normal limits  AMMONIA - Abnormal; Notable for the following:    Ammonia 34 (*)    All other components within normal limits  CBC WITH DIFFERENTIAL/PLATELET - Abnormal; Notable for the following:    RDW 17.1 (*)    All other components within normal limits  CBG MONITORING, ED - Abnormal; Notable for the following:    Glucose-Capillary 50 (*)    All other components within normal limits  I-STAT CHEM 8, ED - Abnormal; Notable for the following:    Potassium 5.3 (*)    Glucose, Bld 57 (*)    All other components within normal limits  CBG MONITORING, ED - Abnormal;  Notable for the following:    Glucose-Capillary 140 (*)    All other components within normal limits  CBG MONITORING, ED - Abnormal; Notable for the following:    Glucose-Capillary 33 (*)    All other components within normal limits  VALPROIC ACID LEVEL  CBC WITH DIFFERENTIAL/PLATELET  POC URINE PREG, ED  CBG MONITORING, ED  CBG MONITORING, ED  CBG MONITORING, ED    Imaging Review Ct Head Wo Contrast  11/30/2014   CLINICAL DATA:  Altered mental status. Patient is drowsy and diabetic.  EXAM: CT HEAD WITHOUT CONTRAST  TECHNIQUE: Contiguous axial images were obtained from the base of the skull through the vertex without intravenous contrast.  COMPARISON:  10/13/2014  FINDINGS: There is no evidence of mass effect, midline shift, or extra-axial  fluid collections. There is no evidence of a space-occupying lesion or intracranial hemorrhage. There is no evidence of a cortical-based area of acute infarction. There is generalized cerebral atrophy. There is periventricular white matter low attenuation likely secondary to microangiopathy.  The ventricles and sulci are appropriate for the patient's age. The basal cisterns are patent.  Visualized portions of the orbits are unremarkable. The visualized portions of the paranasal sinuses and mastoid air cells are unremarkable. Cerebrovascular atherosclerotic calcifications are noted.  The osseous structures are unremarkable.  IMPRESSION: 1. No acute intracranial pathology. 2. 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   Electronically Signed   By: Elige Ko   On: 11/30/2014 13:49     EKG Interpretation   Date/Time:  Friday November 30 2014 11:55:25 EDT Ventricular Rate:  94 PR Interval:  129 QRS Duration: 86 QT Interval:  354 QTC Calculation: 443 R Axis:   59 Text Interpretation:  Sinus rhythm Borderline repolarization abnormality  No significant change since last tracing Confirmed  by Anitra Lauth  MD,  Alphonzo Lemmings ([id number]) on 11/30/2014 12:05:25 PM      MDM   Final diagnoses:  Altered mental state  Hypoglycemia   patient arrived for altered mental status. She has a history of diabetes, schizophrenia, hypertension. Patient is lethargic but will open her eyes briefly to voice and will mumble occasionally. Patient found to have a plan sugar of 50. Will replace with 1 amp of D50 to see if patient's mental status clears.  She has no abdominal pain, signs of trauma, evidence of cellulitis or fever at this time.  CBC, Chem-8, UA, UPT, Depakote level, ammonia pending.  EKG without acute findings.  Caregiver from the group home arrived and states patient came to her at 4 PM yesterday and when she arrived there she seemed very drowsy and out of it but she came to breakfast this morning and 8 at fair amount of breakfast and then became more more unresponsive as the morning progressed. Caregiver states that her insulin bottles also a different things and she is unclear how much insulin she has received. She otherwise has taken her other medicines as prescribed.  1:50 PM After 1 amp of D50 when patient checks approximately 1 hour later patient's blood sugar was 33. She was given a separate second amp of D50 and started on a D10 drip.  Labs are otherwise without acute finding.  Head CT neg.  Pt is still somnolent and feel may be due to some of her psych meds.  Will admit for further care.   CRITICAL CARE Performed by: Gwyneth Sprout Total critical care time: 30 Critical care time was exclusive of separately billable procedures and treating other patients. Critical care was necessary to treat or prevent imminent or life-threatening deterioration. Critical care was time spent personally by me on the following activities: development of treatment plan with patient and/or surrogate as well as nursing, discussions with consultants, evaluation of patient's response to treatment, examination of  patient, obtaining history from patient or surrogate, ordering and performing treatments and interventions, ordering and review of laboratory studies, ordering and review of radiographic studies, pulse oximetry and re-evaluation of patient's condition.   Gwyneth Sprout, MD 11/30/14 1450  Gwyneth Sprout, MD 11/30/14 [phone]

## 2014-11-30 NOTE — ED Notes (Signed)
CBG- 140.  Minimal change in LOC.  MD made aware.

## 2014-11-30 NOTE — ED Notes (Signed)
CBG meter reading 90. 

## 2014-11-30 NOTE — ED Notes (Signed)
CBG: 111 mg/dl 

## 2014-11-30 NOTE — ED Notes (Signed)
Worker from group home is at bedside.  Worker reports that pt has been lethargic and sluggish since she was dropped off.  Pt was unable to dress self this morning and "hasn't been really with it since we got her last night."

## 2014-11-30 NOTE — ED Notes (Signed)
CBG meter reading 90.

## 2014-11-30 NOTE — ED Notes (Signed)
Pt is now at baseline and able to verbalize.  Pt A&O.

## 2014-11-30 NOTE — H&P (Signed)
Family Medicine Teaching [facility] Admission History and Physical Service Pager: [phone]  Patient name: Alexandria Harvey Medical record number: [id number] Date of birth: 17-[name]-1981 Age: 34 y.o. Gender: female  Primary Care Provider: No primary care provider on file. Consultants: None Code Status: Full  Chief Complaint: Altered mental status  Assessment and Plan: Alexandria Harvey is a 34 y.o. female presenting with altered mental status. PMH is significant for type 2 diabetes, hypertension, hyperlipidemia, seizure disorder, MR, and schizoaffective disorder with aggressive behavior.  Acute encephalopathy: With limited history, presumptive etiology is hypoglycemic coma due to insulin overdose, and possibly psychotropic polypharmacy (both intentional vs. accidental). Differential diagnosis is broad but includes vascular etiology (CT head in ED negative for any acute intracranial process)  vs seizure( No seizure-like activity reported by caregiver, but h/o seizure disorder) vs infectious (afebrile, no leukocytosis, UA unremarkable) vs cardiac (EKG with NSR with borderline repolarization) vs endocrine (hypoglycemic in ED CBG 50 to 33 and started on D10 drip, not in DKA) vs medications (reports a different medication regimens; unsure of exact medications patient has taken today) vs toxins(valporic acid wnl) vs psychiatric(patient with extensive psych history and prior suicidal attempts). Most likely etiology is medications and hypoglycemia. GCS 9 responds to painful stimuli.   - Admit to SDU under Dr. Lum Babe - Continuous telemetry - Continue D10 drip at 75 mL/hr - UDS, ethanol level, acetaminophen level, salicylate level ordered - Spurious ammonia elevation, will check LFTs - repeat EKG, CBC, BMP in AM - Consider getting ABG if mental status decompensates - low threshold for CCM consult if patient decompensates - Hold all sedating medications - CBG monitoring every hour. Unfortunately, if this  is an insulin overdose, it appears to have been lantus so this may have prolonged duration and certainly requires heightened nursing care and warrants increased acuity  - Neuro checks every 4 hours - Consult to social work  Schizoaffective disorder with h/o suicidal ideation and aggressive behaviors: Just in ED prior to admission for >190 hours for aggressive behavior. Has been seen by behavorial health on multiple occasions. - Witholding home psych medications at this time due to somnolence  - Consider adding when necessary Ativan for agitation and aggression  - Consider psychiatric consult if patient becomes aggressive or if suicidal ideation present. We suspect this to be likely.  T2DM: Last A1c in 2011 8.5. Unsure of home insulin regimen. Per EMR patient takes Lantus 75U at bedtime with Humalog 3 times a day 20s through 37 units before meals. Patient also taking metformin  BID. -Repeat A1c -In the setting of hypoglycemia holding insulin regimen -Add back sliding-scale insulin and Lantus when appropriate.  Seizure disorder: Unknown when last seizure. No neurology notes available. -Continue home Depakote -Seizure precautions  HTN: Normotensive. Had one soft BP and ED though no SIRS. -Hold home blood pressure medications. -Add back as needed  Hyperkalemia: Mild, on POC chem 8. No intervention, just monitoring. Will need to be normalized prior to adding ACE back.   HLD: Last lipid panel in 2011. Total cholesterol 222, LDL 161.  - Hold home Lipitor. -Consider repeating lipid panel -Resume when patient more alert  FEN/GI: NPO, diet when more alert. D10 80mL/hr Prophylaxis: Hep Subq  Disposition: Admit to FPTS.  History of Present Illness: Alexandria Harvey is a 34 y.o. female presenting with altered mental status. PMH is significant for insulin-dependent type 2 diabetes, hypertension, hyperlipidemia, seizure disorder, MR, and schizoaffective disorder with aggressive behavior.    Unable to obtain history from patient.  She is somnolent and hard to arouse. History provided by ED provider. Per provider patient was just placed in a new group home yesterday. Family dropped her off at the group home and she was unresponsive. This morning caregiver gave patient medications as listed on her med list. Caregiver unsure of amount of insulin patient has taken due to patient having 3 bottles with different regimens on them. Caregiver states that patient was barely arousable in the ED.  In the ED, patient was found to have CBG of 50 with repeat of 33. She is given an amp of D50 2. She was started on D10 drip with stabilization of glucose levels. Full workup for altered mental status was performed.  Of note, she was held in the ED from 3/14 - 3/22 for aggressive behavior without meeting inpatient status requirements.   Review Of Systems: Unable to perform ROS.  Patient Active Problem List   Diagnosis Date Noted  . Hypoglycemia 11/30/2014  . MR (mental retardation), moderate   . Aggressive behavior   . Schizoaffective disorder, unspecified type   . Outbursts of anger   . Suicidal ideation   . Schizoaffective disorder 10/06/2014  . Suicidal ideations 10/06/2014  . Mental retardation   . EAR PAIN, RIGHT 11/27/2010  . DYSURIA 07/29/2010  . CALLUS, FOOT 04/28/2010  . BACTERIAL VAGINITIS 01/20/2010  . ALLERGIC RHINITIS 12/19/2009  . VAGINITIS, CANDIDAL 10/28/2009  . PRURITUS, VAGINAL 10/28/2009  . DYSPEPSIA 09/26/2009  . CONSTIPATION 09/26/2009  . TINEA PEDIS 12/17/2008  . UNSPECIFIED SCHIZOPHRENIA CHRONIC CONDITION 11/15/2008  . DIABETES MELLITUS, TYPE II 01/08/2007  . HYPERLIPIDEMIA 01/08/2007  . DEPRESSIVE DSORD, RCR SEVR W/PSYCT BHVR 01/08/2007  . MENTAL RETARDATION 01/08/2007  . VISUAL HALLUCINATION 01/08/2007  . HYPERTENSION 01/08/2007  . SEIZURE DISORDER 01/08/2007  . Aggressive behavior of adult 01/08/2007   Past Medical History: Past Medical History   Diagnosis Date  . Mild mental retardation   . Excessive anger   . Schizophrenia   . Hallucinations   . Diabetes mellitus without complication   . Hypertension    Past Surgical History: History reviewed. No pertinent past surgical history. Social History: History  Substance Use Topics  . Smoking status: Never Smoker   . Smokeless tobacco: Not on file  . Alcohol Use: No   Additional social history: Stays in group home Please also refer to relevant sections of EMR.  Family History: No family history on file. Allergies and Medications: Allergies  Allergen Reactions  . Haloperidol Lactate   . Shrimp [Shellfish Allergy] Hives and Rash   No current facility-administered medications on file prior to encounter.   Current Outpatient Prescriptions on File Prior to Encounter  Medication Sig Dispense Refill  . atenolol (TENORMIN) 50 MG tablet Take 50 mg by mouth daily.    Marland Kitchen atorvastatin (LIPITOR) 80 MG tablet Take 80 mg by mouth at bedtime.    . divalproex (DEPAKOTE) 500 MG DR tablet Take 500-1,000 mg by mouth 2 (two) times daily. Takes one tablet in the morning and two tablets at bedtime    . docusate sodium (COLACE) 100 MG capsule Take 100 mg by mouth 2 (two) times daily.    Marland Kitchen imipramine (TOFRANIL) 10 MG tablet Take 40 mg by mouth at bedtime.    . insulin glargine (LANTUS) 100 UNIT/ML injection Inject 75 Units into the skin at bedtime.    . insulin lispro (HUMALOG) 100 UNIT/ML injection Inject 20-37 Units into the skin 3 (three) times daily before meals. 37 units given before  breakfast and lunch, then 20 units before supper. **To be given within 15 minutes of completion of meals and NOT to be given if patient <50% of meal or pre-prandial glucose is 75mg /dL**    . ipratropium (ATROVENT) 0.03 % nasal spray Place 1 spray under the tongue 2 (two) times daily.     Marland Kitchen lisinopril (PRINIVIL,ZESTRIL) 2.5 MG tablet Take 2.5 mg by mouth daily.    Marland Kitchen LORazepam (ATIVAN) 1 MG tablet Take 1-2 mg by  mouth 2 (two) times daily. Take one tablet twice daily in the morning and in the afternoon and take 2 tablets at bedtime    . medroxyPROGESTERone (DEPO-PROVERA) 150 MG/ML injection Inject 150 mg into the muscle every 3 (three) months.    . metFORMIN (GLUCOPHAGE) 500 MG tablet Take 1,000 mg by mouth 2 (two) times daily with a meal.    . methylphenidate 36 MG PO CR tablet Take 36 mg by mouth every morning.    Marland Kitchen omeprazole (PRILOSEC) 20 MG capsule Take 40 mg by mouth daily.    Marland Kitchen selenium sulfide (SELSUN) 2.5 % shampoo Apply 1 application topically every Wednesday.      Objective: BP 110/61 mmHg  Pulse 81  Resp 17  SpO2 100% Exam: General: Obese female, NAD, lethargic, appears well-developed and well-nourished  Head: normocephalic and atraumatic. Hirsutum on face. Mouth: MMM, oropharynx clear Lungs: CTAB, comfortable work of breathing on room air Heart: RRR, good capillary refill, no LE edema Abdomen: abdomen soft and nontender. No masses, organomegaly or hernias noted.  Pulses: DP/PT are full and equal bilaterally.  Extremities: No cyanosis, clubbing, deformities Neurologic: Lethargic, GCS 9. Muscle tone and bulk appropriate Skin: Intact without suspicious lesions or rashes. Warm and dry.  Labs and Imaging: Results for orders placed or performed during the hospital encounter of 11/30/14 (from the past 24 hour(s))  CBG monitoring, ED     Status: Abnormal   Collection Time: 11/30/14 11:57 AM  Result Value Ref Range   Glucose-Capillary 50 (L) 70 - 99 mg/dL  Basic metabolic panel     Status: Abnormal   Collection Time: 11/30/14 12:00 PM  Result Value Ref Range   Sodium 138 135 - 145 mmol/L   Potassium 5.1 3.5 - 5.1 mmol/L   Chloride 103 96 - 112 mmol/L   CO2 23 19 - 32 mmol/L   Glucose, Bld 55 (L) 70 - 99 mg/dL   BUN 15 6 - 23 mg/dL   Creatinine, Ser 1.91 0.50 - 1.10 mg/dL   Calcium 9.5 8.4 - 47.8 mg/dL   GFR calc non Af Amer >90 >90 mL/min   GFR calc Af Amer >90 >90 mL/min    Anion gap 12 5 - 15  Valproic acid level     Status: None   Collection Time: 11/30/14 12:00 PM  Result Value Ref Range   Valproic Acid Lvl 78.5 50.0 - 100.0 ug/mL  I-stat chem 8, ed     Status: Abnormal   Collection Time: 11/30/14 12:16 PM  Result Value Ref Range   Sodium 139 135 - 145 mmol/L   Potassium 5.3 (H) 3.5 - 5.1 mmol/L   Chloride 106 96 - 112 mmol/L   BUN 19 6 - 23 mg/dL   Creatinine, Ser 2.95 0.50 - 1.10 mg/dL   Glucose, Bld 57 (L) 70 - 99 mg/dL   Calcium, Ion 6.21 3.08 - 1.23 mmol/L   TCO2 22 0 - 100 mmol/L   Hemoglobin 14.6 12.0 - 15.0 g/dL   HCT 43.0  36.0 - 46.0 %  CBG monitoring, ED     Status: Abnormal   Collection Time: 11/30/14 12:30 PM  Result Value Ref Range   Glucose-Capillary 140 (H) 70 - 99 mg/dL  Urinalysis, Routine w reflex microscopic     Status: Abnormal   Collection Time: 11/30/14 12:53 PM  Result Value Ref Range   Color, Urine AMBER (A) YELLOW   APPearance CLEAR CLEAR   Specific Gravity, Urine 1.034 (H) 1.005 - 1.030   pH 6.0 5.0 - 8.0   Glucose, UA 500 (A) NEGATIVE mg/dL   Hgb urine dipstick NEGATIVE NEGATIVE   Bilirubin Urine NEGATIVE NEGATIVE   Ketones, ur 15 (A) NEGATIVE mg/dL   Protein, ur NEGATIVE NEGATIVE mg/dL   Urobilinogen, UA 0.2 0.0 - 1.0 mg/dL   Nitrite NEGATIVE NEGATIVE   Leukocytes, UA NEGATIVE NEGATIVE  POC urine preg, ED (not at [facility])     Status: None   Collection Time: 11/30/14  1:02 PM  Result Value Ref Range   Preg Test, Ur NEGATIVE NEGATIVE  CBG monitoring, ED     Status: None   Collection Time: 11/30/14  1:04 PM  Result Value Ref Range   Glucose-Capillary 85 70 - 99 mg/dL  CBC with Differential     Status: Abnormal   Collection Time: 11/30/14  1:24 PM  Result Value Ref Range   WBC 10.3 4.0 - 10.5 K/uL   RBC 4.66 3.87 - 5.11 MIL/uL   Hemoglobin 12.7 12.0 - 15.0 g/dL   HCT 16.1 09.6 - 04.5 %   MCV 85.2 78.0 - 100.0 fL   MCH 27.3 26.0 - 34.0 pg   MCHC 32.0 30.0 - 36.0 g/dL   RDW 40.9 (H) 81.1 - 91.4 %   Platelets  238 150 - 400 K/uL   Neutrophils Relative % 53 43 - 77 %   Lymphocytes Relative 34 12 - 46 %   Monocytes Relative 11 3 - 12 %   Eosinophils Relative 1 0 - 5 %   Basophils Relative 1 0 - 1 %   Neutro Abs 5.5 1.7 - 7.7 K/uL   Lymphs Abs 3.5 0.7 - 4.0 K/uL   Monocytes Absolute 1.1 (H) 0.1 - 1.0 K/uL   Eosinophils Absolute 0.1 0.0 - 0.7 K/uL   Basophils Absolute 0.1 0.0 - 0.1 K/uL   Smear Review MORPHOLOGY UNREMARKABLE   CBG monitoring, ED     Status: Abnormal   Collection Time: 11/30/14  1:47 PM  Result Value Ref Range   Glucose-Capillary 33 (LL) 70 - 99 mg/dL  Ammonia     Status: Abnormal   Collection Time: 11/30/14  1:59 PM  Result Value Ref Range   Ammonia 34 (H) 11 - 32 umol/L  CBG monitoring, ED     Status: None   Collection Time: 11/30/14  2:22 PM  Result Value Ref Range   Glucose-Capillary 90 70 - 99 mg/dL  CBG monitoring, ED     Status: None   Collection Time: 11/30/14  2:41 PM  Result Value Ref Range   Glucose-Capillary 90 70 - 99 mg/dL   Ct Head Wo Contrast  11/30/2014   CLINICAL DATA:  Altered mental status. Patient is drowsy and diabetic.  EXAM: CT HEAD WITHOUT CONTRAST  TECHNIQUE: Contiguous axial images were obtained from the base of the skull through the vertex without intravenous contrast.  COMPARISON:  10/13/2014  FINDINGS: There is no evidence of mass effect, midline shift, or extra-axial fluid collections. There is no evidence of a  space-occupying lesion or intracranial hemorrhage. There is no evidence of a cortical-based area of acute infarction. There is generalized cerebral atrophy. There is periventricular white matter low attenuation likely secondary to microangiopathy.  The ventricles and sulci are appropriate for the patient's age. The basal cisterns are patent.  Visualized portions of the orbits are unremarkable. The visualized portions of the paranasal sinuses and mastoid air cells are unremarkable. Cerebrovascular atherosclerotic calcifications are noted.  The  osseous structures are unremarkable.  IMPRESSION: 1. No acute intracranial pathology. 2. 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   Electronically Signed   By: Elige KoHetal  Patel   On: 11/30/2014 13:49    Pincus LargeJazma Y Phelps, DO 11/30/2014, 3:04 PM PGY-1, [facility] FPTS Intern pager: [phone], text pages welcome   I have seen and evaluated the patient with Dr. Doroteo GlassmanPhelps. I am in agreement with the note above in its revised form. My additions are in red.  Ryan B. Jarvis NewcomerGrunz, MD, PGY-2 11/30/2014 4:18 PM

## 2014-12-01 DIAGNOSIS — R4182 Altered mental status, unspecified: Secondary | ICD-10-CM | POA: Insufficient documentation

## 2014-12-01 DIAGNOSIS — E160 Drug-induced hypoglycemia without coma: Secondary | ICD-10-CM | POA: Diagnosis not present

## 2014-12-01 DIAGNOSIS — E162 Hypoglycemia, unspecified: Secondary | ICD-10-CM | POA: Diagnosis not present

## 2014-12-01 DIAGNOSIS — G9340 Encephalopathy, unspecified: Secondary | ICD-10-CM | POA: Diagnosis not present

## 2014-12-01 DIAGNOSIS — E119 Type 2 diabetes mellitus without complications: Secondary | ICD-10-CM | POA: Diagnosis not present

## 2014-12-01 LAB — CBC WITH DIFFERENTIAL/PLATELET
Basophils Absolute: 0.1 10*3/uL (ref 0.0–0.1)
Basophils Relative: 1 % (ref 0–1)
Eosinophils Absolute: 0.2 10*3/uL (ref 0.0–0.7)
Eosinophils Relative: 2 % (ref 0–5)
HCT: 42 % (ref 36.0–46.0)
Hemoglobin: 13.8 g/dL (ref 12.0–15.0)
Lymphocytes Relative: 38 % (ref 12–46)
Lymphs Abs: 3.7 10*3/uL (ref 0.7–4.0)
MCH: 27.4 pg (ref 26.0–34.0)
MCHC: 32.9 g/dL (ref 30.0–36.0)
MCV: 83.5 fL (ref 78.0–100.0)
Monocytes Absolute: 0.7 10*3/uL (ref 0.1–1.0)
Monocytes Relative: 7 % (ref 3–12)
NEUTROS ABS: 5.1 10*3/uL (ref 1.7–7.7)
Neutrophils Relative %: 52 % (ref 43–77)
Platelets: 287 10*3/uL (ref 150–400)
RBC: 5.03 MIL/uL (ref 3.87–5.11)
RDW: 17 % — AB (ref 11.5–15.5)
WBC: 9.8 10*3/uL (ref 4.0–10.5)

## 2014-12-01 LAB — BASIC METABOLIC PANEL
ANION GAP: 5 (ref 5–15)
BUN: 5 mg/dL — AB (ref 6–23)
CO2: 27 mmol/L (ref 19–32)
Calcium: 8.8 mg/dL (ref 8.4–10.5)
Chloride: 110 mmol/L (ref 96–112)
Creatinine, Ser: 0.53 mg/dL (ref 0.50–1.10)
GFR calc Af Amer: >90 mL/min (ref >90)
GFR calc non Af Amer: >90 mL/min (ref >90)
Glucose, Bld: 122 mg/dL — ABNORMAL HIGH (ref 70–99)
Potassium: 3.9 mmol/L (ref 3.5–5.1)
Sodium: 142 mmol/L (ref 135–145)

## 2014-12-01 LAB — CBC
HCT: 41.3 % (ref 36.0–46.0)
HEMOGLOBIN: 12.9 g/dL (ref 12.0–15.0)
MCH: 27.4 pg (ref 26.0–34.0)
MCHC: 31.2 g/dL (ref 30.0–36.0)
MCV: 87.7 fL (ref 78.0–100.0)
Platelets: 245 10*3/uL (ref 150–400)
RBC: 4.71 MIL/uL (ref 3.87–5.11)
RDW: 17.5 % — AB (ref 11.5–15.5)
WBC: 11.3 10*3/uL — ABNORMAL HIGH (ref 4.0–10.5)

## 2014-12-01 LAB — GLUCOSE, CAPILLARY
GLUCOSE-CAPILLARY: 121 mg/dL — AB (ref 70–99)
GLUCOSE-CAPILLARY: 126 mg/dL — AB (ref 70–99)
GLUCOSE-CAPILLARY: 140 mg/dL — AB (ref 70–99)
GLUCOSE-CAPILLARY: 152 mg/dL — AB (ref 70–99)
GLUCOSE-CAPILLARY: 194 mg/dL — AB (ref 70–99)
Glucose-Capillary: 182 mg/dL — ABNORMAL HIGH (ref 70–99)
Glucose-Capillary: 104 mg/dL — ABNORMAL HIGH (ref 70–99)
Glucose-Capillary: 195 mg/dL — ABNORMAL HIGH (ref 70–99)
Glucose-Capillary: 174 mg/dL — ABNORMAL HIGH (ref 70–99)
Glucose-Capillary: 158 mg/dL — ABNORMAL HIGH (ref 70–99)

## 2014-12-01 LAB — MRSA PCR SCREENING: MRSA BY PCR: NEGATIVE

## 2014-12-01 LAB — RAPID URINE DRUG SCREEN, HOSP PERFORMED
Amphetamines: NOT DETECTED
Barbiturates: NOT DETECTED
Benzodiazepines: POSITIVE — AB
Cocaine: NOT DETECTED
OPIATES: NOT DETECTED
Tetrahydrocannabinol: NOT DETECTED

## 2014-12-01 MED ORDER — INSULIN ASPART 100 UNIT/ML ~~LOC~~ SOLN
0.0000 [IU] | Freq: Three times a day (TID) | SUBCUTANEOUS | Status: DC
  Administered 2014-12-01 (×2): 3 [IU] via SUBCUTANEOUS
  Administered 2014-12-02: 5 [IU] via SUBCUTANEOUS
  Administered 2014-12-02 – 2014-12-03 (×3): 2 [IU] via SUBCUTANEOUS
  Administered 2014-12-03 (×2): 3 [IU] via SUBCUTANEOUS

## 2014-12-01 MED ORDER — CLOZAPINE 25 MG PO TABS: 175.0000 mg | ORAL_TABLET | Freq: Every day | ORAL | Status: DC

## 2014-12-01 MED ORDER — CLOZAPINE 25 MG PO TABS
175.0000 mg | ORAL_TABLET | Freq: Every day | ORAL | Status: DC
  Administered 2014-12-03: 175 mg via ORAL
  Filled 2014-12-01 (×2): qty 3

## 2014-12-01 MED ORDER — CLOZAPINE 25 MG PO TABS
175.0000 mg | ORAL_TABLET | Freq: Every day | ORAL | Status: AC
  Administered 2014-12-01 – 2014-12-02 (×2): 175 mg via ORAL
  Filled 2014-12-01 (×2): qty 3

## 2014-12-01 NOTE — Progress Notes (Signed)
Family Medicine Teaching Service Daily Progress Note Intern Pager: [phone]  Patient name: Alexandria Harvey Medical record number: [id number] Date of birth: Aug 04, 1981 Age: 34 y.o. Gender: female  Primary Care Provider: No primary care provider on file. Consultants: none Code Status: full  Pt Overview and Major Events to Date:  3/25 - admitted with AMS and hypoglycemia  Assessment and Plan: Alexandria Harvey is a 34 y.o. female presenting with altered mental status. PMH is significant for type 2 diabetes, hypertension, hyperlipidemia, seizure disorder, MR, and schizoaffective disorder with aggressive behavior.  Acute encephalopathy: Back to baseline this am. With limited history, presumptive etiology is hypoglycemic coma due to insulin overdose, and possibly psychotropic polypharmacy (both intentional vs. accidental). Hypoglycemic in ED CBG 50 to 33 and started on D10 drip - Continuous telemetry - Continue D10 drip at 75 mL/hr until CBGs rise and eating normally (likely later today) - UDS, ethanol level ordered - acetaminophen level, salicylate level normal - CBG monitoring every hour. Unfortunately, if this is an insulin overdose, it appears to have been lantus so this may have prolonged duration and certainly requires heightened nursing care and warrants increased acuity  - Neuro checks every 4 hours - Consult to social work  Schizoaffective disorder with h/o suicidal ideation and aggressive behaviors: Just in ED prior to admission for >190 hours for aggressive behavior. Has been seen by behavorial health on multiple occasions. - Restart home clozaril now that patient at baseline mental status - Consider adding when necessary Ativan for agitation and aggression  - Consider psychiatric consult if patient becomes aggressive or if suicidal ideation present.  T2DM: Last A1c in 2011 8.5. Unsure of home insulin regimen. Per EMR patient takes Lantus 75U at bedtime with Humalog 3 times a day 20s  through 37 units before meals. Patient also taking metformin  BID. -Repeat A1c -In the setting of hypoglycemia holding insulin regimen -Add back sliding-scale insulin and Lantus when appropriate.  Seizure disorder: Unknown when last seizure. No neurology notes available. -Continue home Depakote -Seizure precautions  HTN: Normotensive. Had one soft BP in ED though no SIRS. -Hold home blood pressure medications. -Add back as needed  Hyperkalemia: Mild, on POC chem 8. No intervention, just monitoring. Will need to be normalized prior to adding ACE back.   HLD: Last lipid panel in 2011. Total cholesterol 222, LDL 161.  - Hold home Lipitor. - Consider repeating lipid panel - Resume when patient more alert  FEN/GI: NPO, diet when more alert. D10 79mL/hr Prophylaxis: Hep Subq  Disposition: Back to group home when stable on home meds  Subjective:  Feeling fine this am. Ate breakfast. Excited about Easter, says it's her favorite holiday.  Objective: Temp:  [97.5 F (36.4 C)-98.3 F (36.8 C)] 98 F (36.7 C) (03/26 0327) Pulse Rate:  [79-116] 79 (03/26 0400) Resp:  [13-25] 13 (03/26 0400) BP: (81-140)/(52-101) 114/73 mmHg (03/26 0327) SpO2:  [96 %-100 %] 100 % (03/26 0400) Weight:  [178 lb 5.6 oz (80.9 kg)] 178 lb 5.6 oz (80.9 kg) (03/25 1700) Physical Exam: General: Obese female, NAD, appears well-developed and well-nourished  Head: normocephalic and atraumatic. Hirsutum on face. Mouth: MMM, NCAT Lungs: CTAB, comfortable work of breathing on room air Heart: RRR, good capillary refill, no LE edema Abdomen: abdomen soft and nontender. No masses, organomegaly or hernias noted.  Pulses: DP/PT are full and equal bilaterally.  Extremities: No cyanosis, clubbing, deformities Neurologic: Alert and oriented. No focal deficits noted Skin: Intact without suspicious lesions or rashes. Warm and  dry.  Laboratory:  Recent Labs Lab 11/30/14 1324 11/30/14 1943 12/01/14 0254  WBC  10.3 9.1 11.3*  HGB 12.7 13.1 12.9  HCT 39.7 39.8 41.3  PLT 238 257 245    Recent Labs Lab 11/30/14 1200 11/30/14 1216 11/30/14 1943 12/01/14 0254  NA 138 139 141 142  K 5.1 5.3* 3.7 3.9  CL 103 106 106 110  CO2 23  --  28 27  BUN 15 19 9  5*  CREATININE 0.71 0.70 0.58 0.53  CALCIUM 9.5  --  8.9 8.8  PROT  --   --  6.7  --   BILITOT  --   --  0.4  --   ALKPHOS  --   --  58  --   ALT  --   --  18  --   AST  --   --  23  --   GLUCOSE 55* 57* 130* 122*  Acetaminophen negative Salicylate negative  Imaging/Diagnostic Tests: CT head: 1. No acute intracranial pathology. 2. 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  Abram SanderElena M Adamo, MD 12/01/2014, 8:14 AM PGY-2, [place] [facility] FPTS Intern pager: [phone], text pages welcome

## 2014-12-02 DIAGNOSIS — E119 Type 2 diabetes mellitus without complications: Secondary | ICD-10-CM

## 2014-12-02 DIAGNOSIS — G40909 Epilepsy, unspecified, not intractable, without status epilepticus: Secondary | ICD-10-CM

## 2014-12-02 DIAGNOSIS — E160 Drug-induced hypoglycemia without coma: Secondary | ICD-10-CM

## 2014-12-02 DIAGNOSIS — I10 Essential (primary) hypertension: Secondary | ICD-10-CM | POA: Diagnosis not present

## 2014-12-02 DIAGNOSIS — E162 Hypoglycemia, unspecified: Secondary | ICD-10-CM | POA: Diagnosis not present

## 2014-12-02 DIAGNOSIS — T383X5A Adverse effect of insulin and oral hypoglycemic [antidiabetic] drugs, initial encounter: Secondary | ICD-10-CM

## 2014-12-02 DIAGNOSIS — R4182 Altered mental status, unspecified: Secondary | ICD-10-CM | POA: Diagnosis not present

## 2014-12-02 LAB — GLUCOSE, CAPILLARY
GLUCOSE-CAPILLARY: 359 mg/dL — AB (ref 70–99)
GLUCOSE-CAPILLARY: 209 mg/dL — AB (ref 70–99)
GLUCOSE-CAPILLARY: 152 mg/dL — AB (ref 70–99)
Glucose-Capillary: 120 mg/dL — ABNORMAL HIGH (ref 70–99)
Glucose-Capillary: 105 mg/dL — ABNORMAL HIGH (ref 70–99)
Glucose-Capillary: 123 mg/dL — ABNORMAL HIGH (ref 70–99)
Glucose-Capillary: 198 mg/dL — ABNORMAL HIGH (ref 70–99)
Glucose-Capillary: 143 mg/dL — ABNORMAL HIGH (ref 70–99)

## 2014-12-02 MED ORDER — INFLUENZA VAC SPLIT QUAD 0.5 ML IM SUSY
0.5000 mL | PREFILLED_SYRINGE | INTRAMUSCULAR | Status: DC
  Filled 2014-12-02: qty 0.5

## 2014-12-02 MED ORDER — LORAZEPAM 1 MG PO TABS
1.0000 mg | ORAL_TABLET | ORAL | Status: DC
  Administered 2014-12-02: 1 mg via ORAL
  Filled 2014-12-02: qty 1

## 2014-12-02 MED ORDER — PNEUMOCOCCAL VAC POLYVALENT 25 MCG/0.5ML IJ INJ
0.5000 mL | INJECTION | INTRAMUSCULAR | Status: DC
  Filled 2014-12-02: qty 0.5

## 2014-12-02 MED ORDER — INSULIN GLARGINE 100 UNIT/ML ~~LOC~~ SOLN
10.0000 [IU] | Freq: Every day | SUBCUTANEOUS | Status: DC
  Administered 2014-12-02 – 2014-12-04 (×3): 10 [IU] via SUBCUTANEOUS
  Filled 2014-12-02 (×3): qty 0.1

## 2014-12-02 MED ORDER — LORAZEPAM 1 MG PO TABS
2.0000 mg | ORAL_TABLET | Freq: Every day | ORAL | Status: DC
  Administered 2014-12-02: 2 mg via ORAL
  Filled 2014-12-02: qty 2

## 2014-12-02 NOTE — Progress Notes (Signed)
UR completed 

## 2014-12-02 NOTE — Progress Notes (Signed)
Family Medicine Teaching Service Daily Progress Note Intern Pager: [phone]  Patient name: Alexandria Harvey Medical record number: [id number] Date of birth: [birth year] Age: 34 y.o. Gender: female  Primary Care Provider: No primary care provider on file. Consultants: none Code Status: full  Pt Overview and Major Events to Date:  3/25 - admitted with AMS and hypoglycemia  Assessment and Plan: Alexandria Harvey is a 34 y.o. female presenting with altered mental status. PMH is significant for type 2 diabetes, hypertension, hyperlipidemia, seizure disorder, MR, and schizoaffective disorder with aggressive behavior.  #Acute encephalopathy: Resolved. With limited history, presumptive etiology is hypoglycemic coma due to insulin overdose, and possibly psychotropic polypharmacy (both intentional vs. accidental). Hypoglycemic in ED CBG 50 to 33 and started on D10 drip - Neuro checks every 4 hours - Consult to social work  #T2DM: Per EMR patient takes Lantus 75U at bedtime with Humalog 3 times a day 20s through 37 units before meals. Patient also taking metformin  BID. -Repeat A1c -In the setting of hypoglycemia holding insulin regimen - moderate SSI  - only needing 6 U of short acting, will hold adding lantus for now   #Seizure disorder: Unknown when last seizure.  -Continue home Depakote -Seizure precautions  #Schizoaffective disorder with h/o suicidal ideation and aggressive behaviors: Just in ED prior to admission for >190 hours for aggressive behavior. Has been seen by behavorial health on multiple occasions. - Restart home clozaril now that patient at baseline mental status - restarting home ativan  - Consider psychiatric consult if patient becomes aggressive or if suicidal ideation present.  #HTN: Normotensive.  -Hold home blood pressure medications. -Add back as needed  #Hyperkalemia: Resolved.  Will need to be normalized prior to adding ACE back.   #HLD: Last lipid panel in  2011. Total cholesterol 222, LDL 161.  - Hold home Lipitor.  FEN/GI: NPO, diet when more alert. D10 52mL/hr Prophylaxis: Hep Subq  Disposition: Back to group home when stable on home meds  Subjective:  Feeling much better since admission. She is alert and no questions at this time.   Objective: Temp:  [97.7 F (36.5 C)-99.3 F (37.4 C)] 99.3 F (37.4 C) (03/27 0400) Pulse Rate:  [91-124] 113 (03/27 0400) Resp:  [0-17] 15 (03/27 0400) BP: (112-146)/(64-98) 121/64 mmHg (03/27 0400) SpO2:  [96 %-99 %] 96 % (03/27 0400) Physical Exam: General: Obese female, NAD, appears well-developed and well-nourished  Head: normocephalic and atraumatic. Hirsutum on face. Mouth: MMM, NCAT Lungs: CTAB, comfortable work of breathing on room air Heart: regular rhythm, tachycardic, good capillary refill, no LE edema Abdomen: abdomen soft and nontender. No masses, organomegaly or hernias noted.  Pulses: DP/PT are full and equal bilaterally.  Extremities: No cyanosis, clubbing, deformities Neurologic: Alert and oriented. No focal deficits noted Skin: Intact without suspicious lesions or rashes. Warm and dry.  Laboratory:  Recent Labs Lab 11/30/14 1943 12/01/14 0254 12/01/14 0943  WBC 9.1 11.3* 9.8  HGB 13.1 12.9 13.8  HCT 39.8 41.3 42.0  PLT 257 245 287    Recent Labs Lab 11/30/14 1200 11/30/14 1216 11/30/14 1943 12/01/14 0254  NA 138 139 141 142  K 5.1 5.3* 3.7 3.9  CL 103 106 106 110  CO2 23  --  28 27  BUN 5*  CREATININE 0.71 0.70 0.58 0.53  CALCIUM 9.5  --  8.9 8.8  PROT  --   --  6.7  --   BILITOT  --   --  0.4  --  ALKPHOS  --   --  58  --   ALT  --   --  18  --   AST  --   --  23  --   GLUCOSE 55* 57* 130* 122*  Acetaminophen negative Salicylate negative  Imaging/Diagnostic Tests: CT head: 1. No acute intracranial pathology. 2. 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  Myra RudeJeremy E  Schmitz, MD 12/02/2014, 8:09 AM PGY-2, [place] [facility] FPTS Intern pager: [phone], text pages welcome

## 2014-12-02 NOTE — Progress Notes (Signed)
Notified MD of 2pm finger stick glucose of 359. Orders received.

## 2014-12-03 ENCOUNTER — Observation Stay (HOSPITAL_COMMUNITY): Payer: Medicare Other

## 2014-12-03 ENCOUNTER — Encounter (HOSPITAL_COMMUNITY): Payer: Self-pay | Admitting: *Deleted

## 2014-12-03 DIAGNOSIS — R400 Somnolence: Secondary | ICD-10-CM

## 2014-12-03 DIAGNOSIS — G9340 Encephalopathy, unspecified: Secondary | ICD-10-CM | POA: Diagnosis not present

## 2014-12-03 LAB — BLOOD GAS, ARTERIAL
Acid-base deficit: 4.5 mmol/L — ABNORMAL HIGH (ref 0.0–2.0)
BICARBONATE: 19.5 meq/L — AB (ref 20.0–24.0)
Drawn by: 249101
FIO2: 0.21 %
O2 Saturation: 94.2 %
PH ART: 7.392 (ref 7.350–7.450)
Patient temperature: 98.6
TCO2: 20.5 mmol/L (ref 0–100)
pCO2 arterial: 32.8 mmHg — ABNORMAL LOW (ref 35.0–45.0)
pO2, Arterial: 74.8 mmHg — ABNORMAL LOW (ref 80.0–100.0)

## 2014-12-03 LAB — GLUCOSE, CAPILLARY
GLUCOSE-CAPILLARY: 154 mg/dL — AB (ref 70–99)
GLUCOSE-CAPILLARY: 122 mg/dL — AB (ref 70–99)
GLUCOSE-CAPILLARY: 185 mg/dL — AB (ref 70–99)
Glucose-Capillary: 114 mg/dL — ABNORMAL HIGH (ref 70–99)
Glucose-Capillary: 172 mg/dL — ABNORMAL HIGH (ref 70–99)
Glucose-Capillary: 178 mg/dL — ABNORMAL HIGH (ref 70–99)

## 2014-12-03 LAB — BASIC METABOLIC PANEL
Anion gap: 10 (ref 5–15)
BUN: 11 mg/dL (ref 6–23)
CO2: 21 mmol/L (ref 19–32)
CREATININE: 0.58 mg/dL (ref 0.50–1.10)
Calcium: 9.4 mg/dL (ref 8.4–10.5)
Chloride: 109 mmol/L (ref 96–112)
GFR calc Af Amer: >90 mL/min (ref >90)
GLUCOSE: 132 mg/dL — AB (ref 70–99)
POTASSIUM: 4.4 mmol/L (ref 3.5–5.1)
Sodium: 140 mmol/L (ref 135–145)

## 2014-12-03 LAB — HEMOGLOBIN A1C
Hgb A1c MFr Bld: 6.3 % — ABNORMAL HIGH (ref 4.8–5.6)
Mean Plasma Glucose: 134 mg/dL

## 2014-12-03 MED ORDER — INSULIN ASPART 100 UNIT/ML ~~LOC~~ SOLN
0.0000 [IU] | SUBCUTANEOUS | Status: DC
  Administered 2014-12-03 – 2014-12-04 (×2): 3 [IU] via SUBCUTANEOUS
  Administered 2014-12-04 (×2): 2 [IU] via SUBCUTANEOUS

## 2014-12-03 MED ORDER — ATORVASTATIN CALCIUM 80 MG PO TABS
80.0000 mg | ORAL_TABLET | Freq: Every day | ORAL | Status: DC
  Administered 2014-12-03: 80 mg via ORAL
  Filled 2014-12-03 (×2): qty 1

## 2014-12-03 MED ORDER — LORAZEPAM 2 MG/ML IJ SOLN: 0.5000 mg | Freq: Four times a day (QID) | INTRAMUSCULAR | Status: DC | PRN

## 2014-12-03 MED ORDER — IOHEXOL 350 MG/ML SOLN
80.0000 mL | Freq: Once | INTRAVENOUS | Status: AC | PRN
  Administered 2014-12-03: 80 mL via INTRAVENOUS

## 2014-12-03 MED ORDER — LORAZEPAM 0.5 MG PO TABS: 0.5000 mg | ORAL_TABLET | Freq: Four times a day (QID) | ORAL | Status: DC | PRN

## 2014-12-03 NOTE — Progress Notes (Signed)
Nutrition Brief Note  Patient identified on the Malnutrition Screening Tool (MST) Report  Wt Readings from Last 15 Encounters:  11/30/14 178 lb 5.6 oz (80.9 kg)  11/19/14 194 lb 1.6 oz (88.043 kg)  11/12/14 194 lb (87.998 kg)  10/13/14 185 lb (83.915 kg)  10/05/14 185 lb (83.915 kg)  09/29/14 188 lb (85.276 kg)  11/27/10 185 lb 2.1 oz (83.975 kg)  07/29/10 188 lb 3 oz (85.361 kg)  04/28/10 194 lb (87.998 kg)  01/20/10 188 lb (85.276 kg)  12/19/09 190 lb 8 oz (86.41 kg)  10/28/09 188 lb (85.276 kg)  09/26/09 188 lb (85.276 kg)  01/01/09 214 lb (97.07 kg)  12/17/08 213 lb (96.616 kg)   Alexandria Harvey is a 34 y.o. female presenting with altered mental status. PMH is significant for type 2 diabetes, hypertension, hyperlipidemia, seizure disorder, MR, and schizoaffective disorder with aggressive behavior.  Pt somnolent at time of visit. Noted pt consumed 75% of her lunch tray. Per MD notes, plan to simplify DM regimen to prevent further hypoglycemic episodes. No signs of fat or muscle depletion noted.   Body mass index is 32.08 kg/(m^2). Patient meets criteria for obesity, class I based on current BMI.   Current diet order is Heart Healthy/ Carb Modified, patient is consuming approximately 75% of meals at this time. Labs and medications reviewed.   No nutrition interventions warranted at this time. If nutrition issues arise, please consult RD.   Alexandria Harvey, RD, LDN, CDE Pager: [phone] After hours Pager: [phone]

## 2014-12-03 NOTE — Significant Event (Signed)
Rapid Response Event Note  Overview: Time Called: 0837 Arrival Time: 0840 Event Type: Neurologic  Initial Focused Assessment:  Called by primary RN for patient unresponsive.  Upon my arrival to patients room, RN and MD at bedside.  Patient is in bed, responds briefly to deep sternal rub then drifts quickly back to sleep.  VSS, Patient with audible upper airway noise, pupils 1 sluggish.  Skin warm and dry.  As per RN, was receiving scheduled ativan, which has been dc'd due to patient becoming lethargic, last received around 2300.     Interventions:  EKG done, results reviewed, ABG done results reviewed.  Primary RN received orders.   Event Summary:  Rn to call if assistance needed   at      at          [facility], Alexandria Harvey

## 2014-12-03 NOTE — Progress Notes (Signed)
Patient in deep sleep and slightly difficult to arouse this morning. Once patient awake, she is alert but then drifts back off to sleep. Dr. Jordan LikesSchmitz notified as patient has scheduled Ativan 1mg  PO at 0600. MD stated not to give 0600 dose. Will continue to monitor patient.

## 2014-12-03 NOTE — Progress Notes (Signed)
Family Medicine Teaching Service Daily Progress Note Intern Pager: [phone]  Patient name: Alexandria Harvey Medical record number: [id number] Date of birth: 04/16/1981 Age: 34 y.o. Gender: female  Primary Care Provider: No primary care provider on file. Consultants: none Code Status: full  Pt Overview and Major Events to Date:  3/25: admitted with AMS and hypoglycemia 3/28: Another episode of AMS in setting of Ativan  Assessment and Plan: Alexandria Harvey is a 34 y.o. female presenting with altered mental status. PMH is significant for type 2 diabetes, hypertension, hyperlipidemia, seizure disorder, MR, and schizoaffective disorder with aggressive behavior.  #Acute encephalopathy: Patient had another episode of AMS this AM. With limited history, presumptive etiology is hypoglycemic coma due to insulin overdose, and possibly psychotropic polypharmacy (both intentional vs. accidental). Hypoglycemic in ED CBG 50 to 33 and started on D10 drip.  GCS 7 this morning. Episode this AM likely due to Ativan.  - Stat ABG ordered showing nml pH with some low pO2. Patient given some oxygen. - Stat EKG with sinus tachycardia - BMP unremarkable - Neuro checks every 4 hours - Consult to social work - continue to monitor holding ativan at this time  - will have discussions today with caregiver on patient medication regimen - patient needs work-up for OSA as an outpatient due to concerns of apnea with sleep and daytime solmnelance  #T2DM: Per EMR patient takes Lantus 75U at bedtime with Humalog 3 times a day 20s through 37 units before meals. Patient also taking metformin 1000mg  BID. No longer hypoglycemia. Patient with  CBGs ~100s. - A1c 6.3 - moderate SSI  - Lantus 10U qhs - will likely discharge home on just metformin and Lantus.   #Tachycardia: Has been tachycardiac for the last 2 days. Afebrile. Patient also has been diaphoretic. Wells' score 3 which puts patient in moderate risk group. No fever, does  not appear hypovolemic, no hypoxia. Believe this could be a component of benzo withdrawal.  - CTA ordered to r/o PE -Atenolol restarted to help with HR -CIWA protocol for benzo withdrawal - 0.5mg  prn  #Schizoaffective disorder with h/o suicidal ideation and aggressive behaviors: Just in ED prior to admission for >190 hours for aggressive behavior. Has been seen by behavorial health on multiple occasions. - Continue clozaril now that patient at baseline mental status - Holding home ativan dose - Consider psychiatric consult if patient becomes aggressive or if suicidal ideation present.  #Seizure disorder: Unknown when last seizure.  -Continue home Depakote -Seizure precautions  #HTN: Normotensive.  -Restart Lisinopril and Atenolol.  #Hyperkalemia: Resolved.  Will need to be normalized prior to adding ACE back.   #HLD: Last lipid panel in 2011. Total cholesterol 222, LDL 161.  - Restart Lipitor.  FEN/GI: Diet HH/carb modified. SLIV. Prophylaxis: Hep Subq  Disposition: Back to group home when stable. Pending medication adjustments and stable mental status.   Subjective:  Unable to obtain information this Am. Patient with AMS and lethargic. Not responding to my nail bed pressure or sternal rub. RR RN called to room and applied eye pressure which patient aroused to. GCS 7. Patient awakes but falls back to sleep. Stat labs ordered. Per RN overnight nurse signed out that patient was somnolent but arousable. She received total of 3 mg of Ativan yesterday pm.  Objective: Temp:  [97.5 F (36.4 C)-99.4 F (37.4 C)] 99.4 F (37.4 C) (03/28 0839) Pulse Rate:  [110-116] 116 (03/28 0854) Resp:  [13-20] 20 (03/28 0854) BP: (118-134)/(63-104) 131/84 mmHg (03/28 0854) SpO2:  [  95 %-100 %] 95 % (03/28 0854) Physical Exam: General: Obese female, NAD, appears well-developed and well-nourished, lethargic Head: normocephalic and atraumatic. Hirsutum on face. Eyes: PERRLA Mouth: MMM, NCAT Lungs:  CTAB, comfortable work of breathing on room air Heart: regular rhythm, tachycardic, good capillary refill, no LE edema Abdomen: abdomen soft, +BS. No masses, organomegaly or hernias noted.  Pulses: DP/PT are full and equal bilaterally.  Extremities: No cyanosis, clubbing, deformities Neurologic: lethargic. GCS 7.   Skin: Intact without suspicious lesions or rashes. Warm and dry.  Laboratory:  Recent Labs Lab 11/30/14 1943 12/01/14 0254 12/01/14 0943  WBC 9.1 11.3* 9.8  HGB 13.1 12.9 13.8  HCT 39.8 41.3 42.0  PLT 257 245 287    Recent Labs Lab 11/30/14 1200 11/30/14 1216 11/30/14 1943 12/01/14 0254  NA 138 139 141 142  K 5.1 5.3* 3.7 3.9  CL 103 106 106 110  CO2 23  --  28 27  BUN 5*  CREATININE 0.71 0.70 0.58 0.53  CALCIUM 9.5  --  8.9 8.8  PROT  --   --  6.7  --   BILITOT  --   --  0.4  --   ALKPHOS  --   --  58  --   ALT  --   --  18  --   AST  --   --  23  --   GLUCOSE 55* 57* 130* 122*  Acetaminophen negative Salicylate negative  Imaging/Diagnostic Tests: CT head: 1. No acute intracranial pathology. 2. 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  Pincus Large, DO 12/03/2014, 9:57 AM PGY-1, [facility] FPTS Intern pager: [phone], text pages welcome

## 2014-12-03 NOTE — Progress Notes (Signed)
FPTS Interim Progress Note  S: Went to revaluate patient this PM. She responds to name and is able to answer questions. However still appears very fatigued. She was able to eat lunch.   Spoke to group home director who told me that staff at home give patient her medications and they go off an MAR. Patient does have a PCP in [place] (Dr. Virgina OrganQureshi?)  and a Psychiatrist (Dr. Lynnell GrainMark Chandler). Asked if he would send us copy of what patient is receiving there. He informed me that a release will probably need to be signed. Patient has a DSS assigned Guardian named Roxanne.  O: BP 138/92 mmHg  Pulse 109  Temp(Src) 98.1 F (36.7 C) (Oral)  Resp 16  Ht 5' 2.5" (1.588 m)  Wt 178 lb 5.6 oz (80.9 kg)  BMI 32.08 kg/m2  SpO2 100%   General: somnolent but arousable  A/P: Patient started on CIWA protocol for possible Benzo withdrawal. If ativan needed for agitation needs to be a at low dose in order to avoid AMS and lethargy. Awaiting fax from group home of medications patient receives. Patient still needs close monitoring as she is not fully awake and alert. Continue to also monitor vitals signs as she has been persistently tachycardic. PE r/o and EKG with normal sinus tachycardia. Restarted BB. Tachycardia maybe withdrawal induced.   Pincus LargeJazma Y Phelps, DO 12/03/2014, 3:41 PM

## 2014-12-03 NOTE — Progress Notes (Signed)
UR completed 

## 2014-12-04 DIAGNOSIS — I10 Essential (primary) hypertension: Secondary | ICD-10-CM | POA: Diagnosis not present

## 2014-12-04 DIAGNOSIS — G9340 Encephalopathy, unspecified: Secondary | ICD-10-CM | POA: Diagnosis not present

## 2014-12-04 LAB — GLUCOSE, CAPILLARY
GLUCOSE-CAPILLARY: 113 mg/dL — AB (ref 70–99)
GLUCOSE-CAPILLARY: 132 mg/dL — AB (ref 70–99)
GLUCOSE-CAPILLARY: 210 mg/dL — AB (ref 70–99)
Glucose-Capillary: 129 mg/dL — ABNORMAL HIGH (ref 70–99)
Glucose-Capillary: 174 mg/dL — ABNORMAL HIGH (ref 70–99)

## 2014-12-04 LAB — TSH: TSH: 0.912 u[IU]/mL (ref 0.350–4.500)

## 2014-12-04 MED ORDER — ATENOLOL 50 MG PO TABS
50.0000 mg | ORAL_TABLET | Freq: Every day | ORAL | Status: DC
  Administered 2014-12-04: 50 mg via ORAL
  Filled 2014-12-04: qty 1

## 2014-12-04 MED ORDER — INSULIN GLARGINE 100 UNIT/ML ~~LOC~~ SOLN: 10.0000 [IU] | Freq: Every day | SUBCUTANEOUS | Status: DC

## 2014-12-04 MED ORDER — GLUCOSE BLOOD VI STRP: ORAL_STRIP | Status: DC

## 2014-12-04 MED ORDER — ONETOUCH ULTRA 2 W/DEVICE KIT: PACK | Status: DC

## 2014-12-04 MED ORDER — LISINOPRIL 2.5 MG PO TABS
2.5000 mg | ORAL_TABLET | Freq: Every day | ORAL | Status: DC
  Administered 2014-12-04: 2.5 mg via ORAL
  Filled 2014-12-04: qty 1

## 2014-12-04 MED ORDER — ONETOUCH ULTRASOFT LANCETS MISC: Status: DC

## 2014-12-04 NOTE — Progress Notes (Signed)
Spoke with Alexandria JacksMarianne Wallace about the plan of care while the pt is in the group home.  Requested a glucose meter and test strips.  Prescription placed in packet that will be transported w/ pt.  Will continue to monitor.

## 2014-12-04 NOTE — Progress Notes (Signed)
UR completed 

## 2014-12-04 NOTE — Progress Notes (Signed)
Family Medicine Teaching Service Daily Progress Note Intern Pager: [phone]  Patient name: Georgetta Haberashia Beal Medical record number: [id number] Date of birth: [birth year] Age: 34 y.o. Gender: female  Primary Care Provider: No primary care provider on file. Consultants: none Code Status: full  Pt Overview and Major Events to Date:  3/25: admitted with AMS and hypoglycemia 3/28: Another episode of AMS in setting of Ativan  Assessment and Plan: Georgetta Haberashia Lean is a 34 y.o. female presenting with altered mental status. PMH is significant for type 2 diabetes, hypertension, hyperlipidemia, seizure disorder, MR, and schizoaffective disorder with aggressive behavior.  #Acute encephalopathy: Resolved.With limited history, presumptive etiology for first episdoe is hypoglycemic coma due to insulin overdose, and possibly psychotropic polypharmacy (both intentional vs. accidental). Hypoglycemic in ED CBG 50 to 33 and started on D10 drip.  Second epsiode of AMS was likely due to Ativan. Patient is now stable.  - Labs unremarkable  - Consult to social work - continue to monitor holding ativan at this time  - patient needs work-up for OSA as an outpatient due to concerns of apnea with sleep and daytime somnolance - please see note from CSW; fax of medications patient was receiving at group home to be sent  #T2DM: Per EMR patient takes Lantus 75U at bedtime with Humalog 3 times a day 20s through 37 units before meals. Patient also taking metformin 1000mg  BID. No longer hypoglycemia. Patient with  CBGs ~100s. - A1c 6.3 - moderate SSI  - Lantus 10U qhs - will likely discharge home on just metformin and Lantus.   #Tachycardia: Has been tachycardiac for the last 2 days. Afebrile. Patient also has been diaphoretic. Wells' score 3 which puts patient in moderate risk group. No fever, does not appear hypovolemic, no hypoxia. Believe this could be a component of benzo withdrawal.  - CTA ordered to r/o PE -Atenolol  restarted to help with HR -CIWA protocol for benzo withdrawal - 0.5mg  prn  #Schizoaffective disorder with h/o suicidal ideation and aggressive behaviors: Just in ED prior to admission for >190 hours for aggressive behavior. Has been seen by behavorial health on multiple occasions. - Continue clozaril now that patient at baseline mental status - Holding home ativan dose - Consider psychiatric consult if patient becomes aggressive or if suicidal ideation present.  #Seizure disorder: Unknown when last seizure.  -Continue home Depakote -Seizure precautions  #HTN: Elevated.  -Restart Lisinopril and Atenolol.  #Hyperkalemia: Resolved.     #HLD: Last lipid panel in 2011. Total cholesterol 222, LDL 161.  - Continue Lipitor.  FEN/GI: Diet HH/carb modified. SLIV. Prophylaxis: Hep Subq  Disposition: Back to group home.   Subjective:  Patient sleeping upon entering the room but is able to arouse. She is minimally conversant. No acute overnight events. Patient has no complaints or concerns.  Objective: Temp:  [98 F (36.7 C)-99.4 F (37.4 C)] 98 F (36.7 C) (03/29 0611) Pulse Rate:  [108-116] 114 (03/29 0611) Resp:  [12-20] 13 (03/29 0611) BP: (124-142)/(75-104) 142/88 mmHg (03/29 0611) SpO2:  [95 %-100 %] 99 % (03/29 62130611) Physical Exam: General: Obese female, NAD, appears well-developed and well-nourished, somnolent Head: normocephalic and atraumatic. Hirsutum on face. Mouth: MMM, NCAT Lungs: CTAB, comfortable work of breathing on room air Heart: regular rhythm, tachycardic, good capillary refill, no LE edema Abdomen: abdomen soft, +BS. No masses, organomegaly or hernias noted.  Neurologic: Alert. No focal deficits appreciated.  Skin: Intact without suspicious lesions or rashes. Warm and dry. Psych: flat affect, minimally engaged, no eye contact  Laboratory:  Recent Labs Lab 11/30/14 1943 12/01/14 0254 12/01/14 0943  WBC 9.1 11.3* 9.8  HGB 13.1 12.9 13.8  HCT 39.8 41.3  42.0  PLT 257 245 287    Recent Labs Lab 11/30/14 1943 12/01/14 0254 12/03/14 0911  NA 141 142 140  K 3.7 3.9 4.4  CL 106 110 109  CO2 BUN 9 5* 11  CREATININE 0.58 0.53 0.58  CALCIUM 8.9 8.8 9.4  PROT 6.7  --   --   BILITOT 0.4  --   --   ALKPHOS 58  --   --   ALT 18  --   --   AST 23  --   --   GLUCOSE 130* 122* 132*  Acetaminophen negative Salicylate negative  Imaging/Diagnostic Tests: CT head: 1. No acute intracranial pathology. 2. 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  Pincus Large, DO 12/04/2014, 8:01 AM PGY-1, [facility] FPTS Intern pager: [phone], text pages welcome

## 2014-12-04 NOTE — Discharge Instructions (Signed)
Discharge Date: 12/04/2014  Reason for Hospitalization: Altered mental status  We believe that there may have been a component of polypharmacy that contributed to patient's altered mental status. Several of her medications have been held. She will need close outpatient follow-up with her primary care doctor so that they can add back medications as needed. I suggest she get seen within the next week. Please only use this med rec going forward for medications for this patient.  Dispo: Back to group home   Thank you for letting us participate in your care!

## 2014-12-04 NOTE — Clinical Social Work Note (Signed)
H&P, FL2, and progress note sent to Rosario JacksMarianne Wallace (group home operator) for review. CSW will assist with discharging patient once MD determines patient is ready.   Roddie McBryant Emaan Gary MSW, [place] Village[place]CSWA, [place], [phone]
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
## 2014-12-04 NOTE — Clinical Social Work Note (Signed)
[facility] MD [facility] RN, patient/family (Guardian Roxanna notified of DC - messaged had to be left), and facility notified of patient's DC. RN given number for report. DC packet on patient's chart. Ambulance transport requested for patient. CSW signing off at this time.    Roddie McBryant Taiten Brawn MSW, [place], [place], [phone]

## 2014-12-04 NOTE — Discharge Summary (Signed)
Family Medicine Teaching [facility] Discharge Summary  Patient name: Alexandria Harvey Medical record number: [id number] Date of birth: February 07, 1981 Age: 34 y.o. Gender: female Date of Admission: 11/30/2014  Date of Discharge: 12/04/2014 Admitting Physician: Doreene Eland, MD  Primary Care Provider: No primary care provider on file. Consultants: None  Indication for Hospitalization: Altered Mental Status  Discharge Diagnoses/Problem List:  Patient Active Problem List   Diagnosis Date Noted  . Altered mental state   . Hypoglycemia 11/30/2014  . Acute encephalopathy 11/30/2014  . Overdose 11/30/2014  . Hypoglycemia due to insulin 11/30/2014  . MR (mental retardation), moderate   . Aggressive behavior   . Schizoaffective disorder, unspecified type   . Outbursts of anger   . Suicidal ideation   . Schizoaffective disorder 10/06/2014  . Suicidal ideations 10/06/2014  . Mental retardation   . EAR PAIN, RIGHT 11/27/2010  . DYSURIA 07/29/2010  . CALLUS, FOOT 04/28/2010  . BACTERIAL VAGINITIS 01/20/2010  . ALLERGIC RHINITIS 12/19/2009  . VAGINITIS, CANDIDAL 10/28/2009  . PRURITUS, VAGINAL 10/28/2009  . DYSPEPSIA 09/26/2009  . CONSTIPATION 09/26/2009  . TINEA PEDIS 12/17/2008  . UNSPECIFIED SCHIZOPHRENIA CHRONIC CONDITION 11/15/2008  . T2DM (type 2 diabetes mellitus) 01/08/2007  . HYPERLIPIDEMIA 01/08/2007  . DEPRESSIVE DSORD, RCR SEVR W/PSYCT BHVR 01/08/2007  . MENTAL RETARDATION 01/08/2007  . VISUAL HALLUCINATION 01/08/2007  . Essential hypertension 01/08/2007  . Seizure disorder 01/08/2007  . Aggressive behavior of adult 01/08/2007    Disposition: Back to group Home  Discharge Condition: Improved  Discharge Exam:  Filed Vitals:   12/04/14 1115  BP: 119/84  Pulse: 106  Temp: 98.1 F (36.7 C)  Resp: 16   General: Obese female, NAD, appears well-developed and well-nourished, somnolent Head: normocephalic and atraumatic. Hirsutum on face. Mouth: MMM, NCAT Lungs:  CTAB, comfortable work of breathing on room air Heart: regular rhythm, tachycardic, good capillary refill, no LE edema Abdomen: abdomen soft, +BS. No masses, organomegaly or hernias noted.  Neurologic: Alert. No focal deficits appreciated.  Skin: Intact without suspicious lesions or rashes. Warm and dry. Psych: flat affect, minimally engaged, no eye contact  Brief Hospital Course:  Alexandria Harvey is a 34 y.o. Female who presented with altered mental status. PMH is significant for type 2 diabetes, hypertension, hyperlipidemia, seizure disorder, MR, and schizoaffective disorder with aggressive behavior.  Acute encephalopathy: Patient originally admitted for altered mental status. GCS of 9 and responded to painful stimuli. Presumptive etiology at that time was hypoglycemic coma due to insulin overdose. History was limited but med rec said the patient received 76 units of Lantus daily with 21-37 units of SSI. Patient was started on D10 drip showed no signs of DKA. CBG on admission was 33. D10 drip was maintained for 24 hours and patient's blood sugars corrected. Her mental status improved once sugars were corrected. However 2 days later patient had another episode of altered mental status after receiving Ativan. At that time Ativan was held patient no longer had any more episodes of altered mental status. Labs unremarkable and CT head with no acute process. We held all sedating medications for patient during this admission including Ativan and imipramine.  T2DM: Per EMR patient takes Lantus 75U at bedtime with Humalog 3 times a day 20s through 37 units before meals. However patient just moved to new group home. Med rec from group home shows patient only taking metformin and no insulin. A1c this admission 6.3. Patient was discharged with just metformin.  Tachycardia: Patient persistently tachycardic this admission. CTA was  ordered to rule out PE. Patient does not have PE. TSH was unremarkable. Vital signs  remained stable and patient was afebrile. EKG was normal sinus tachycardia. CIWA protocol was initiated due to concerns for possible benzo withdrawal. Patient had normal scores. Patient was started back on her beta blocker with improvement in tachycardia.  Schizoaffective disorder with h/o suicidal ideation and aggressive behaviors: Patient had no episodes of aggression or suicidal ideation this hospitalization. We continued Clozaril but held home Ativan and imipramine. She'll be followed up by psych for further titration of medications.  The patient's other chronic conditions were stable during this hospitalization and the patient was continued on home medications.   Issues for Follow Up:  1. Holding sedating meds; add back as needed for agitation 2. Follow-up with PCP and Psych 3. No indication for insulin treatment. 4. Work-up for OSA as an outpatient due to concerns of apnea with sleep and daytime somnolance  Significant Procedures: None  Significant Labs and Imaging:   Recent Labs Lab 11/30/14 1943 12/01/14 0254 12/01/14 0943  WBC 9.1 11.3* 9.8  HGB 13.1 12.9 13.8  HCT 39.8 41.3 42.0  PLT 257 245 287    Recent Labs Lab 11/30/14 1200 11/30/14 1216 11/30/14 1943 12/01/14 0254 12/03/14 0911  NA 138 139 141 142 140  K 5.1 5.3* 3.7 3.9 4.4  CL 103 106 106 110 109  CO2 23  --  28 27 21   GLUCOSE 55* 57* 130* 122* 132*  BUN 15 19 9  5* 11  CREATININE 0.71 0.70 0.58 0.53 0.58  CALCIUM 9.5  --  8.9 8.8 9.4  ALKPHOS  --   --  58  --   --   AST  --   --  23  --   --   ALT  --   --  18  --   --   ALBUMIN  --   --  2.8*  --   --    TSH 0.912  CT head: 1. No acute intracranial pathology. 2. 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   Results/Tests Pending at Time of Discharge: None  Discharge Medications:    Medication List    STOP taking these medications        amoxicillin 500 MG capsule   Commonly known as:  AMOXIL     imipramine 10 MG tablet  Commonly known as:  TOFRANIL     insulin glargine 100 UNIT/ML injection  Commonly known as:  LANTUS     insulin lispro 100 UNIT/ML injection  Commonly known as:  HUMALOG     LORazepam 1 MG tablet  Commonly known as:  ATIVAN     methylphenidate 36 MG CR tablet  Commonly known as:  CONCERTA      TAKE these medications        atenolol 50 MG tablet  Commonly known as:  TENORMIN  Take 50 mg by mouth daily.     atorvastatin 80 MG tablet  Commonly known as:  LIPITOR  Take 80 mg by mouth at bedtime.     cloZAPine 100 MG tablet  Commonly known as:  CLOZARIL  Take 175 mg by mouth at bedtime.     divalproex 500 MG DR tablet  Commonly known as:  DEPAKOTE  Take 500-1,000 mg by mouth 2 (two) times daily. Takes one tablet in the morning and two tablets at bedtime     docusate sodium 100 MG capsule  Commonly known as:  COLACE  Take 100 mg by mouth 2 (two) times daily.     ipratropium 0.03 % nasal spray  Commonly known as:  ATROVENT  Place 1 spray under the tongue 2 (two) times daily.     lisinopril 2.5 MG tablet  Commonly known as:  PRINIVIL,ZESTRIL  Take 2.5 mg by mouth daily.     medroxyPROGESTERone 150 MG/ML injection  Commonly known as:  DEPO-PROVERA  Inject 150 mg into the muscle every 3 (three) months.     metFORMIN 500 MG tablet  Commonly known as:  GLUCOPHAGE  Take 1,000 mg by mouth 2 (two) times daily with a meal.     omeprazole 20 MG capsule  Commonly known as:  PRILOSEC  Take 40 mg by mouth daily.     selenium sulfide 2.5 % shampoo  Commonly known as:  SELSUN  Apply 1 application topically every Wednesday.        Discharge Instructions: Please refer to Patient Instructions section of EMR for full details.  Patient was counseled important signs and symptoms that should prompt return to medical care, changes in medications, dietary instructions, activity restrictions, and follow up appointments.    Follow-Up Appointments: Follow-up Information    Schedule an appointment as soon as possible for a visit in 1 week to follow up.   Why:  Hospital follow-up      Pincus Large, DO 12/04/2014, 3:24 PM PGY-1, [facility]

## 2015-01-13 DIAGNOSIS — F79 Unspecified intellectual disabilities: Secondary | ICD-10-CM | POA: Insufficient documentation

## 2015-01-13 DIAGNOSIS — I10 Essential (primary) hypertension: Secondary | ICD-10-CM | POA: Insufficient documentation

## 2015-01-13 DIAGNOSIS — F2089 Other schizophrenia: Secondary | ICD-10-CM | POA: Insufficient documentation

## 2015-01-13 DIAGNOSIS — Z79899 Other long term (current) drug therapy: Secondary | ICD-10-CM | POA: Insufficient documentation

## 2015-01-13 DIAGNOSIS — E11649 Type 2 diabetes mellitus with hypoglycemia without coma: Secondary | ICD-10-CM | POA: Insufficient documentation

## 2015-01-13 LAB — POC URINE PREG, ED: PREG TEST UR: NEGATIVE

## 2015-01-13 NOTE — ED Notes (Signed)
Pt presents to ER with group home member, [facility], and reports pt has been having AH and VH. Pt calm at this time, states she is suicidal "every now and then."

## 2015-01-14 ENCOUNTER — Emergency Department
Admission: EM | Admit: 2015-01-14 | Discharge: 2015-01-14 | Disposition: A | Discharge status: Home / Self Care | Payer: Medicare Other | Attending: Emergency Medicine | Admitting: Emergency Medicine

## 2015-01-14 DIAGNOSIS — E119 Type 2 diabetes mellitus without complications: Secondary | ICD-10-CM

## 2015-01-14 DIAGNOSIS — Z8659 Personal history of other mental and behavioral disorders: Secondary | ICD-10-CM

## 2015-01-14 DIAGNOSIS — F79 Unspecified intellectual disabilities: Secondary | ICD-10-CM

## 2015-01-14 DIAGNOSIS — R441 Visual hallucinations: Secondary | ICD-10-CM

## 2015-01-14 DIAGNOSIS — R440 Auditory hallucinations: Secondary | ICD-10-CM

## 2015-01-14 DIAGNOSIS — F2089 Other schizophrenia: Secondary | ICD-10-CM | POA: Insufficient documentation

## 2015-01-14 LAB — CBC WITH DIFFERENTIAL/PLATELET
Basophils Absolute: 0.1 10*3/uL (ref 0–0.1)
Basophils Relative: 1 %
Eosinophils Absolute: 0.5 10*3/uL (ref 0–0.7)
Eosinophils Relative: 4 %
HEMATOCRIT: 38.1 % (ref 35.0–47.0)
Hemoglobin: 12 g/dL (ref 12.0–16.0)
LYMPHS ABS: 6.4 10*3/uL — AB (ref 1.0–3.6)
LYMPHS PCT: 45 %
MCH: 26.7 pg (ref 26.0–34.0)
MCHC: 31.6 g/dL — ABNORMAL LOW (ref 32.0–36.0)
MCV: 84.5 fL (ref 80.0–100.0)
MONO ABS: 0.6 10*3/uL (ref 0.2–0.9)
Monocytes Relative: 4 %
Neutro Abs: 6.7 10*3/uL — ABNORMAL HIGH (ref 1.4–6.5)
Neutrophils Relative %: 46 %
Platelets: 209 10*3/uL (ref 150–440)
RBC: 4.5 MIL/uL (ref 3.80–5.20)
RDW: 17.2 % — AB (ref 11.5–14.5)
WBC: 14.3 10*3/uL — ABNORMAL HIGH (ref 3.6–11.0)

## 2015-01-14 LAB — COMPREHENSIVE METABOLIC PANEL
ALT: 19 U/L (ref 14–54)
AST: 23 U/L (ref 15–41)
Albumin: 3.7 g/dL (ref 3.5–5.0)
Alkaline Phosphatase: 66 U/L (ref 38–126)
Anion gap: 11 (ref 5–15)
BUN: 20 mg/dL (ref 6–20)
CALCIUM: 8.8 mg/dL — AB (ref 8.9–10.3)
CO2: 19 mmol/L — AB (ref 22–32)
CREATININE: 0.64 mg/dL (ref 0.44–1.00)
Chloride: 110 mmol/L (ref 101–111)
GFR calc Af Amer: >60 mL/min (ref >60)
Glucose, Bld: 121 mg/dL — ABNORMAL HIGH (ref 65–99)
Potassium: 4.3 mmol/L (ref 3.5–5.1)
Sodium: 140 mmol/L (ref 135–145)
TOTAL PROTEIN: 7.6 g/dL (ref 6.5–8.1)
Total Bilirubin: 0.5 mg/dL (ref 0.3–1.2)

## 2015-01-14 LAB — URINALYSIS COMPLETE WITH MICROSCOPIC (ARMC ONLY)
Bacteria, UA: NONE SEEN
Bilirubin Urine: NEGATIVE
Glucose, UA: NEGATIVE mg/dL
HGB URINE DIPSTICK: NEGATIVE
Leukocytes, UA: NEGATIVE
Nitrite: NEGATIVE
PROTEIN: NEGATIVE mg/dL
SPECIFIC GRAVITY, URINE: 1.028 (ref 1.005–1.030)
pH: 5 (ref 5.0–8.0)

## 2015-01-14 LAB — ETHANOL: Alcohol, Ethyl (B): <5 mg/dL (ref <5)

## 2015-01-14 LAB — URINE DRUG SCREEN, QUALITATIVE (ARMC ONLY)
Amphetamines, Ur Screen: NOT DETECTED
BARBITURATES, UR SCREEN: NOT DETECTED
BENZODIAZEPINE, UR SCRN: NOT DETECTED
COCAINE METABOLITE, UR ~~LOC~~: NOT DETECTED
Cannabinoid 50 Ng, Ur ~~LOC~~: NOT DETECTED
MDMA (Ecstasy)Ur Screen: NOT DETECTED
METHADONE SCREEN, URINE: NOT DETECTED
Opiate, Ur Screen: NOT DETECTED
Phencyclidine (PCP) Ur S: NOT DETECTED
Tricyclic, Ur Screen: NOT DETECTED

## 2015-01-14 LAB — VALPROIC ACID LEVEL: Valproic Acid Lvl: 54 ug/mL (ref 50.0–100.0)

## 2015-01-14 NOTE — ED Notes (Signed)
I talked to Tei Booze @ group home about pt discharge. She is on way to pick up pt.

## 2015-01-14 NOTE — ED Notes (Signed)
BEHAVIORAL HEALTH ROUNDING Patient sleeping: No. Patient alert and oriented: yes Behavior appropriate: Yes.  ; If no, describe:  Nutrition and fluids offered: Yes  Toileting and hygiene offered: Yes  Sitter present: no Law enforcement present: Yes  

## 2015-01-14 NOTE — ED Notes (Signed)
Pt alert, laying on stretcher. No complaints

## 2015-01-14 NOTE — ED Notes (Signed)
Pt up to the bathroom with steady gait to void; back to bed; calm and cooperative

## 2015-01-14 NOTE — ED Notes (Signed)
BEHAVIORAL HEALTH ROUNDING Patient sleeping: Yes.   Patient alert and oriented: yes Behavior appropriate: Yes.  ; If no, describe:  Nutrition and fluids offered: Yes  Toileting and hygiene offered: Yes  Sitter present: no Law enforcement present: Yes  

## 2015-01-14 NOTE — ED Notes (Signed)
ED BHU PLACEMENT JUSTIFICATION Is the patient under IVC or is there intent for IVC: Yes.   Is the patient medically cleared: Yes.   Is there vacancy in the ED BHU: No. Is the population mix appropriate for patient: Yes.   Is the patient awaiting placement in inpatient or outpatient setting: Yes.   Has the patient had a psychiatric consult: No. Survey of unit performed for contraband, proper placement and condition of furniture, tampering with fixtures in bathroom, shower, and each patient room: Yes.  ; Findings:  APPEARANCE/BEHAVIOR calm and cooperative NEURO ASSESSMENT Orientation: time, place and person Hallucinations: Yes.  Auditory Hallucinations Speech: Normal Gait: normal RESPIRATORY ASSESSMENT Normal expansion.  Clear to auscultation.  No rales, rhonchi, or wheezing. CARDIOVASCULAR ASSESSMENT regular rate and rhythm, S1, S2 normal, no murmur, click, rub or gallop GASTROINTESTINAL ASSESSMENT soft, nontender, BS WNL, no r/g EXTREMITIES normal strength, tone, and muscle mass PLAN OF CARE Provide calm/safe environment. Vital signs assessed twice daily. ED BHU Assessment once each 12-hour shift. Collaborate with intake RN daily or as condition indicates. Assure the ED provider has rounded once each shift. Provide and encourage hygiene. Provide redirection as needed. Assess for escalating behavior; address immediately and inform ED provider.  Assess family dynamic and appropriateness for visitation as needed: Yes.  ; If necessary, describe findings:  Educate the patient/family about BHU procedures/visitation: Yes.  ; If necessary, describe findings:

## 2015-01-14 NOTE — ED Provider Notes (Signed)
[facility] Emergency Department Provider Note  ____________________________________________  Time seen: Approximately 3:48 AM  I have reviewed the triage vital signs and the nursing notes.   HISTORY  Chief Complaint Behavior Problem  Mental retardation limits history  HPI Alexandria Harvey is a 34 y.o. female with a history that includes mental retardationand schizophrenia as well as diabetes and seizures presents with several days of auditory or visual hallucinations.  She reports that the auditory hallucinations are telling her to kill herself, though she does not want to.  She is unsure of her regular medications and the group home is not reachable at this time.   Past Medical History  Diagnosis Date  . Mild mental retardation   . Excessive anger   . Schizophrenia   . Hallucinations   . Diabetes mellitus without complication   . Hypertension   . Seizures     Patient Active Problem List   Diagnosis Date Noted  . Altered mental state   . Hypoglycemia 11/30/2014  . Acute encephalopathy 11/30/2014  . Overdose 11/30/2014  . Hypoglycemia due to insulin 11/30/2014  . MR (mental retardation), moderate   . Aggressive behavior   . Schizoaffective disorder, unspecified type   . Outbursts of anger   . Suicidal ideation   . Schizoaffective disorder 10/06/2014  . Suicidal ideations 10/06/2014  . Mental retardation   . EAR PAIN, RIGHT 11/27/2010  . DYSURIA 07/29/2010  . CALLUS, FOOT 04/28/2010  . BACTERIAL VAGINITIS 01/20/2010  . ALLERGIC RHINITIS 12/19/2009  . VAGINITIS, CANDIDAL 10/28/2009  . PRURITUS, VAGINAL 10/28/2009  . DYSPEPSIA 09/26/2009  . CONSTIPATION 09/26/2009  . TINEA PEDIS 12/17/2008  . UNSPECIFIED SCHIZOPHRENIA CHRONIC CONDITION 11/15/2008  . T2DM (type 2 diabetes mellitus) 01/08/2007  . HYPERLIPIDEMIA 01/08/2007  . DEPRESSIVE DSORD, RCR SEVR W/PSYCT BHVR 01/08/2007  . MENTAL RETARDATION 01/08/2007  . VISUAL HALLUCINATION  01/08/2007  . Essential hypertension 01/08/2007  . Seizure disorder 01/08/2007  . Aggressive behavior of adult 01/08/2007    No past surgical history on file.  Current Outpatient Rx  Name  Route  Sig  Dispense  Refill  . atenolol (TENORMIN) 50 MG tablet   Oral   Take 50 mg by mouth daily.         Marland Kitchen atorvastatin (LIPITOR) 80 MG tablet   Oral   Take 80 mg by mouth at bedtime.         . Blood Glucose Monitoring Suppl (ONE TOUCH ULTRA 2) W/DEVICE KIT      Monitor blood sugars at least 2 times daily.   1 each   0   . cloZAPine (CLOZARIL) 100 MG tablet   Oral   Take 175 mg by mouth at bedtime.         . divalproex (DEPAKOTE) 500 MG DR tablet   Oral   Take 500-1,000 mg by mouth 2 (two) times daily. Takes one tablet in the morning and two tablets at bedtime         . docusate sodium (COLACE) 100 MG capsule   Oral   Take 100 mg by mouth 2 (two) times daily.         Marland Kitchen glucose blood (ONE TOUCH ULTRA TEST) test strip      Use as instructed   100 each   12   . ipratropium (ATROVENT) 0.03 % nasal spray   Sublingual   Place 1 spray under the tongue 2 (two) times daily.          Marland Kitchen  Lancets (ONETOUCH ULTRASOFT) lancets      Use as instructed   100 each   12   . lisinopril (PRINIVIL,ZESTRIL) 2.5 MG tablet   Oral   Take 2.5 mg by mouth daily.         . medroxyPROGESTERone (DEPO-PROVERA) 150 MG/ML injection   Intramuscular   Inject 150 mg into the muscle every 3 (three) months.         . metFORMIN (GLUCOPHAGE) 500 MG tablet   Oral   Take 1,000 mg by mouth 2 (two) times daily with a meal.         . omeprazole (PRILOSEC) 20 MG capsule   Oral   Take 40 mg by mouth daily.         Marland Kitchen selenium sulfide (SELSUN) 2.5 % shampoo   Topical   Apply 1 application topically every Wednesday.           Allergies Haloperidol lactate and Shrimp  No family history on file.  Social History History  Substance Use Topics  . Smoking status: Never Smoker   .  Smokeless tobacco: Not on file  . Alcohol Use: No    Review of Systems Constitutional: No fever/chills Eyes: No visual changes. ENT: No sore throat. Cardiovascular: Denies chest pain. Respiratory: Denies shortness of breath. Gastrointestinal: No abdominal pain.  No nausea, no vomiting.  No diarrhea.  No constipation. Genitourinary: Negative for dysuria. Musculoskeletal: Negative for back pain. Skin: Negative for rash. Neurological: Negative for headaches, focal weakness or numbness. Psychiatric:Hallucinations as described above.  Says she sometimes has suicidal ideation because of the voices  10-point ROS otherwise negative.  ____________________________________________   PHYSICAL EXAM:   Constitutional: No acute distress Eyes: Conjunctivae are normal. PERRL. EOMI. Head: Atraumatic. Nose: No congestion/rhinnorhea. Mouth/Throat: Mucous membranes are moist.  Oropharynx non-erythematous. Neck: No stridor.   Cardiovascular: Normal rate, regular rhythm. Grossly normal heart sounds.  Good peripheral circulation. Respiratory: Normal respiratory effort.  No retractions. Lungs CTAB. Gastrointestinal: Obese, Soft and nontender. No distention. No abdominal bruits. No CVA tenderness. Musculoskeletal: No lower extremity tenderness nor edema.  No joint effusions. Neurologic:  Normal speech and language are at baseline. No gross focal neurologic deficits are appreciated. Speech is at baseline. No gait instability. Skin:  Skin is warm, dry and intact. No rash noted. Psychiatric: Continues to endorse auditory and visual hallucinations with command hallucinations that she should kill herself.  She states she does not want to do so.  ____________________________________________   LABS (all labs ordered are listed, but only abnormal results are displayed)  Labs Reviewed  CBC WITH DIFFERENTIAL/PLATELET - Abnormal; Notable for the following:    WBC 14.3 (*)    MCHC 31.6 (*)    RDW 17.2 (*)     Neutro Abs 6.7 (*)    Lymphs Abs 6.4 (*)    All other components within normal limits  COMPREHENSIVE METABOLIC PANEL - Abnormal; Notable for the following:    CO2 19 (*)    Glucose, Bld 121 (*)    Calcium 8.8 (*)    All other components within normal limits  URINALYSIS COMPLETEWITH MICROSCOPIC ([facility])  - Abnormal; Notable for the following:    Color, Urine YELLOW (*)    APPearance HAZY (*)    Ketones, ur 1+ (*)    Squamous Epithelial / LPF 6-30 (*)    All other components within normal limits  ETHANOL  URINE DRUG SCREEN, QUALITATIVE ([facility])  URINE RAPID DRUG SCREEN (HOSP PERFORMED)  VALPROIC ACID LEVEL  POC URINE PREG, ED   ____________________________________________    INITIAL IMPRESSION / ASSESSMENT AND PLAN / ED COURSE  Pertinent labs & imaging results that were available during my care of the patient were reviewed by me and considered in my medical decision making (see chart for details).  The patient has a history of mental retardation and mental illness.  She has a history of hallucinations but her command hallucinations are of some concern.  I will obtain psych and TTS consults.   The [place] for this patient is to have her home medications reconciled as soon as possible and reordered as she has a history of taking clozapine.  This needs to be verified as soon as possible in the morning. ____________________________________________   FINAL CLINICAL IMPRESSION(S) / ED DIAGNOSES  Final diagnoses:  Continuous auditory hallucinations  Visual hallucinations  History of command hallucinations  Mental retardation  Other schizophrenia  Type 2 diabetes mellitus without complication     Hinda Kehr, MD 01/14/15 [id number]

## 2015-01-14 NOTE — ED Notes (Signed)
BEHAVIORAL HEALTH ROUNDING Patient sleeping: Yes.   Patient alert and oriented: yes Behavior appropriate: Yes.  ;  Nutrition and fluids offered: Yes  Toileting and hygiene offered: Yes  Sitter present: yes Law enforcement present: Yes  

## 2015-01-14 NOTE — ED Provider Notes (Signed)
-----------------------------------------   7:42 AM on 01/14/2015 -----------------------------------------   BP 112/87 mmHg  Pulse 94  Temp(Src) 98.1 F (36.7 C) (Oral)  Resp 16  Ht 5\' 2"  (1.575 m)  Wt 220 lb (99.791 kg)  BMI 40.23 kg/m2  SpO2 97%  The patient had no acute events since last update.  Calm and cooperative at this time.  Disposition is pending per Psychiatry/Behavioral Medicine team recommendations. Also awaiting verification of psych medications.     Arelia Longestavid M Schaevitz, MD 01/14/15 [phone]

## 2015-01-14 NOTE — ED Notes (Signed)
BEHAVIORAL HEALTH ROUNDING Patient sleeping: No. Patient alert and oriented: no hx MR - at baseline Behavior appropriate: Yes.  ; If no, describe:  Nutrition and fluids offered: Yes  Toileting and hygiene offered: Yes  Sitter present: no Law enforcement present: Yes

## 2015-01-14 NOTE — ED Notes (Signed)
Consult completed

## 2015-01-14 NOTE — ED Notes (Signed)
MD at bedside. 

## 2015-01-14 NOTE — Consult Note (Signed)
[facility] Consult   Reason for Consult:  34 year old woman with mental retardation and schizophrenia brought in voluntarily because of concerns about suicidal ideation Referring Physician:  Quale Patient Identification: Esmeralda Malay MRN:  [id number] Principal Diagnosis: <principal problem not specified> Diagnosis:   Patient Active Problem List   Diagnosis Date Noted  . Altered mental state [R41.82]   . Hypoglycemia [E16.2] 11/30/2014  . Acute encephalopathy [G93.40] 11/30/2014  . Overdose [T50.901A] 11/30/2014  . Hypoglycemia due to insulin [E16.0, T38.3X5A] 11/30/2014  . MR (mental retardation), moderate [F71]   . Aggressive behavior [F60.89]   . Schizoaffective disorder, unspecified type [F25.9]   . Outbursts of anger [F91.1]   . Suicidal ideation [R45.851]   . Schizoaffective disorder [F25.9] 10/06/2014  . Suicidal ideations [R45.851] 10/06/2014  . Mental retardation [F79]   . EAR PAIN, RIGHT [H92.09] 11/27/2010  . DYSURIA [R30.0] 07/29/2010  . CALLUS, FOOT [L84] 04/28/2010  . BACTERIAL VAGINITIS [N76.0] 01/20/2010  . ALLERGIC RHINITIS [J30.9] 12/19/2009  . VAGINITIS, CANDIDAL [B37.3] 10/28/2009  . PRURITUS, VAGINAL [L29.3] 10/28/2009  . DYSPEPSIA [K30] 09/26/2009  . CONSTIPATION [K59.00] 09/26/2009  . TINEA PEDIS [B35.3] 12/17/2008  . 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 T2DM (type 2 diabetes mellitus) [E11.9] 01/08/2007  . HYPERLIPIDEMIA [E78.5] 01/08/2007  . 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 MENTAL RETARDATION [F79] 01/08/2007  . VISUAL HALLUCINATION [H53.16] 01/08/2007  . Essential hypertension [I10] 01/08/2007  . Seizure disorder [G40.909] 01/08/2007  . Aggressive behavior of adult [F60.89] 01/08/2007    Total Time spent with patient: 1 hour  Subjective:   Lasean Rahming is a 34 y.o. female patient admitted with patient reports that she was having auditory and visual hallucinations. She  still says that she is seeing things but her description of the is not very realistic. Says that she had had thoughts about killing herself earlier but did not act on it. Now denies any thoughts about wanting to die or wanting to kill herself. Says that she's been frustrated at her group home where she's been living recently. Can't be any more detail than that. She does not know anything about her medicine.Marland Kitchen  HPI:  Patient came here voluntarily. Information from the patient and the chart. Little collateral. She says that she was having voices telling her to kill herself and was having visual hallucinations but that those voices have gone away. Says she never wanted to die and didn't act on it. Hadn't been acting violent. Mood is been frustrated and tired. Sleep sounds like it's erratic. She is not a very good historian overall. Can't describe the frequency of the hallucinations. Says that she just recently moved into the current group home and finds it stressful. Just got out of [facility]. She says she's been compliant with her medicine although she doesn't know what it is. Denies alcohol or drug abuse. Says she has a history of seizures and also diabetes. HPI Elements:   Quality:  Hallucinations with suicidal ideation now resolved. Severity:  Mild to moderate. Timing:  Happening just the last day or 2. Duration:  Transient. Context:  Recent move into a new group home with apparently some other move planned. Not in close contact with family sounds like she is moving from a different part of the state. Recent hospitalization.  Past Medical History:  Past Medical History  Diagnosis Date  . Mild mental retardation   . Excessive anger   . Schizophrenia   . Hallucinations   .  Diabetes mellitus without complication   . Hypertension   . Seizures    No past surgical history on file. Family History: No family history on file. Social History:  History  Alcohol Use No     History  Drug Use  No    History   Social History  . Marital Status: Single    Spouse Name: N/A  . Number of Children: N/A  . Years of Education: N/A   Social History Main Topics  . Smoking status: Never Smoker   . Smokeless tobacco: Not on file  . Alcohol Use: No  . Drug Use: No  . Sexual Activity: Not on file   Other Topics Concern  . Not on file   Social History Narrative   Additional Social History:                          Allergies:   Allergies  Allergen Reactions  . Haloperidol Lactate Other (See Comments)    unk  . Shrimp [Shellfish Allergy] Hives and Rash    Labs:  Results for orders placed or performed during the hospital encounter of 01/14/15 (from the past 48 hour(s))  CBC WITH DIFFERENTIAL     Status: Abnormal   Collection Time: 01/13/15 11:32 PM  Result Value Ref Range   WBC 14.3 (H) 3.6 - 11.0 K/uL   RBC 4.50 3.80 - 5.20 MIL/uL   Hemoglobin 12.0 12.0 - 16.0 g/dL   HCT 38.1 35.0 - 47.0 %   MCV 84.5 80.0 - 100.0 fL   MCH 26.7 26.0 - 34.0 pg   MCHC 31.6 (L) 32.0 - 36.0 g/dL   RDW 17.2 (H) 11.5 - 14.5 %   Platelets 209 150 - 440 K/uL   Neutrophils Relative % 46 %   Neutro Abs 6.7 (H) 1.4 - 6.5 K/uL   Lymphocytes Relative 45 %   Lymphs Abs 6.4 (H) 1.0 - 3.6 K/uL   Monocytes Relative 4 %   Monocytes Absolute 0.6 0.2 - 0.9 K/uL   Eosinophils Relative 4 %   Eosinophils Absolute 0.5 0 - 0.7 K/uL   Basophils Relative 1 %   Basophils Absolute 0.1 0 - 0.1 K/uL  Comprehensive metabolic panel     Status: Abnormal   Collection Time: 01/13/15 11:32 PM  Result Value Ref Range   Sodium 140 135 - 145 mmol/L   Potassium 4.3 3.5 - 5.1 mmol/L   Chloride 110 101 - 111 mmol/L   CO2 19 (L) 22 - 32 mmol/L   Glucose, Bld 121 (H) 65 - 99 mg/dL   BUN 20 6 - 20 mg/dL   Creatinine, Ser 0.64 0.44 - 1.00 mg/dL   Calcium 8.8 (L) 8.9 - 10.3 mg/dL   Total Protein 7.6 6.5 - 8.1 g/dL   Albumin 3.7 3.5 - 5.0 g/dL   AST 23 15 - 41 U/L   ALT 19 14 - 54 U/L   Alkaline Phosphatase  66 38 - 126 U/L   Total Bilirubin 0.5 0.3 - 1.2 mg/dL   GFR calc non Af Amer >60 >60 mL/min   GFR calc Af Amer >60 >60 mL/min    Comment: (NOTE) The eGFR has been calculated using the CKD EPI equation. This calculation has not been validated in all clinical situations. eGFR's persistently <60 mL/min signify possible Chronic Kidney Disease.    Anion gap 11 5 - 15  Ethanol     Status: None   Collection Time: 01/13/15  11:32 PM  Result Value Ref Range   Alcohol, Ethyl (B) <5 <5 mg/dL    Comment:        LOWEST DETECTABLE LIMIT FOR SERUM ALCOHOL IS 11 mg/dL FOR MEDICAL PURPOSES ONLY   Urine Drug Screen, Qualitative [facility])     Status: None   Collection Time: 01/13/15 11:32 PM  Result Value Ref Range   Tricyclic, Ur Screen NONE DETECTED NONE DETECTED   Amphetamines, Ur Screen NONE DETECTED NONE DETECTED   MDMA (Ecstasy)Ur Screen NONE DETECTED NONE DETECTED   Cocaine Metabolite,Ur [place] NONE DETECTED NONE DETECTED   Opiate, Ur Screen NONE DETECTED NONE DETECTED   Phencyclidine (PCP) Ur S NONE DETECTED NONE DETECTED   Cannabinoid 50 Ng, Ur [place] NONE DETECTED NONE DETECTED   Barbiturates, Ur Screen NONE DETECTED NONE DETECTED   Benzodiazepine, Ur Scrn NONE DETECTED NONE DETECTED   Methadone Scn, Ur NONE DETECTED NONE DETECTED    Comment: (NOTE) 468  Tricyclics, urine               Cutoff 1000 ng/mL 200  Amphetamines, urine             Cutoff 1000 ng/mL 300  MDMA (Ecstasy), urine           Cutoff 500 ng/mL 400  Cocaine Metabolite, urine       Cutoff 300 ng/mL 500  Opiate, urine                   Cutoff 300 ng/mL 600  Phencyclidine (PCP), urine      Cutoff 25 ng/mL 700  Cannabinoid, urine              Cutoff 50 ng/mL 800  Barbiturates, urine             Cutoff 200 ng/mL 900  Benzodiazepine, urine           Cutoff 200 ng/mL 1000 Methadone, urine                Cutoff 300 ng/mL 1100 1200 The urine drug screen provides only a preliminary, unconfirmed 1300 analytical test result and should  not be used for non-medical 1400 purposes. Clinical consideration and professional judgment should 1500 be applied to any positive drug screen result due to possible 1600 interfering substances. A more specific alternate chemical method 1700 must be used in order to obtain a confirmed analytical result.  1800 Gas chromato graphy / mass spectrometry (GC/MS) is the preferred 1900 confirmatory method.   Urinalysis complete, with microscopic [facility])     Status: Abnormal   Collection Time: 01/13/15 11:32 PM  Result Value Ref Range   Color, Urine YELLOW (A) YELLOW   APPearance HAZY (A) CLEAR   Glucose, UA NEGATIVE NEGATIVE mg/dL   Bilirubin Urine NEGATIVE NEGATIVE   Ketones, ur 1+ (A) NEGATIVE mg/dL   Specific Gravity, Urine 1.028 1.005 - 1.030   Hgb urine dipstick NEGATIVE NEGATIVE   pH 5.0 5.0 - 8.0   Protein, ur NEGATIVE NEGATIVE mg/dL   Nitrite NEGATIVE NEGATIVE   Leukocytes, UA NEGATIVE NEGATIVE   RBC / HPF 0-5 0 - 5 RBC/hpf   WBC, UA 0-5 0 - 5 WBC/hpf   Bacteria, UA NONE SEEN NONE SEEN   Squamous Epithelial / LPF 6-30 (A) NONE SEEN   Mucous PRESENT   Valproic acid level     Status: None   Collection Time: 01/13/15 11:32 PM  Result Value Ref Range   Valproic Acid Lvl 54 50.0 -  100.0 ug/mL  POC Urine Pregnancy, ED  (not at [facility])     Status: None   Collection Time: 01/13/15 11:58 PM  Result Value Ref Range   Preg Test, Ur Negative     Vitals: Blood pressure 97/54, pulse 86, temperature 98.5 F (36.9 C), temperature source Oral, resp. rate 18, height _0  (1.575 m), weight 99.791 kg (220 lb), SpO2 98 %.  Risk to Self: Suicidal Ideation: Yes-Currently Present Suicidal Intent: Yes-Currently Present (hearing voices telling her to kill herself) Is patient at risk for suicide?: Yes Suicidal Plan?: No Specify Current Suicidal Plan: to tie a shoestring around her neck Access to Means: Yes Specify Access to Suicidal Means: shoestrings What has been your use of drugs/alcohol within  the last 12 months?: none How many times?: 2 Other Self Harm Risks: scratching herself Triggers for Past Attempts: Other personal contacts Intentional Self Injurious Behavior: Damaging Comment - Self Injurious Behavior: scratching Risk to Others: Homicidal Ideation: No Thoughts of Harm to Others: No Comment - Thoughts of Harm to Others: currently denies Current Homicidal Intent: No Current Homicidal Plan: No Access to Homicidal Means: No Describe Access to Homicidal Means: client denies access Identified Victim: none History of harm to others?: Yes Assessment of Violence: On admission Violent Behavior Description: hitting peers and staff Does patient have access to weapons?: No Criminal Charges Pending?: No Describe Pending Criminal Charges: none currently Does patient have a court date: No Prior Inpatient Therapy: Prior Inpatient Therapy: Yes Prior Therapy Dates: 1999-present Prior Therapy Facilty/Provider(s): [facility], [place], etc. Reason for Treatment: SI, Depression Prior Outpatient Therapy: Prior Outpatient Therapy: Yes Prior Therapy Dates: Ongoing Prior Therapy Facilty/Provider(s): Dr. Agapito Games Reason for Treatment: Anger, Mood dysregulation Does patient have an ACCT team?: No Does patient have Intensive In-House Services?  : No Does patient have [facility] services? : No Does patient have P4CC services?: No  No current facility-administered medications for this encounter.   Current Outpatient Prescriptions  Medication Sig Dispense Refill  . atenolol (TENORMIN) 50 MG tablet Take 50 mg by mouth daily.    Marland Kitchen atorvastatin (LIPITOR) 80 MG tablet Take 80 mg by mouth at bedtime.    . bisacodyl (DULCOLAX) 5 MG EC tablet Take 10 mg by mouth daily as needed for moderate constipation.    . Blood Glucose Monitoring Suppl (ONE TOUCH ULTRA 2) W/DEVICE KIT Monitor blood sugars at least 2 times daily. 1 each 0  . cloZAPine (CLOZARIL) 100 MG tablet Take 100-200 mg by mouth 2 (two)  times daily. Take 118m in the morning and 2029mat bedtime    . divalproex (DEPAKOTE) 500 MG DR tablet Take 500 mg by mouth 3 (three) times daily.     . Marland Kitchenocusate sodium (COLACE) 100 MG capsule Take 100 mg by mouth 2 (two) times daily.    . Marland Kitchenlucose blood (ONE TOUCH ULTRA TEST) test strip Use as instructed 100 each 12  . insulin aspart (NOVOLOG) 100 UNIT/ML FlexPen Inject into the skin 4 (four) times daily -  before meals and at bedtime. Sliding scale    . Lancets (ONETOUCH ULTRASOFT) lancets Use as instructed 100 each 12  . lisinopril (PRINIVIL,ZESTRIL) 2.5 MG tablet Take 2.5 mg by mouth daily.    . metFORMIN (GLUCOPHAGE) 500 MG tablet Take 1,000 mg by mouth 2 (two) times daily with a meal.    . omeprazole (PRILOSEC) 20 MG capsule Take 40 mg by mouth daily.    . Marland Kitchenpratropium (ATROVENT) 0.03 % nasal spray Place 1 spray  under the tongue 2 (two) times daily.     . medroxyPROGESTERone (DEPO-PROVERA) 150 MG/ML injection Inject 150 mg into the muscle every 3 (three) months.    . selenium sulfide (SELSUN) 2.5 % shampoo Apply 1 application topically every Wednesday.      Musculoskeletal: Strength & Muscle Tone: within normal limits Gait & Station: normal Patient leans: N/A  Psychiatric Specialty Exam: Physical Exam  Constitutional: She appears well-developed and well-nourished.  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 Pupils are equal, round, and reactive to light.  Neck: Normal range of motion.  Respiratory: Effort normal and breath sounds normal.  Musculoskeletal: Normal range of motion.  Neurological: She is alert. No cranial nerve deficit.  Psychiatric: Her mood appears not anxious. Her affect is blunt. Her affect is not angry. Her speech is delayed and tangential. She is slowed. Thought content is not paranoid and not delusional. Cognition and memory are impaired. She expresses impulsivity. She does not exhibit a  depressed mood. She expresses no homicidal and no suicidal ideation. She exhibits abnormal recent memory and abnormal remote memory.    Review of Systems  Constitutional: Negative.   HENT: Negative.   Eyes: Negative.   Respiratory: Negative.   Cardiovascular: Negative.   Gastrointestinal: Negative.   Musculoskeletal: Negative.   Skin: Negative.   Neurological: Negative.   Psychiatric/Behavioral: Positive for hallucinations. Negative for depression, suicidal ideas, memory loss and substance abuse. The patient is not nervous/anxious and does not have insomnia.     Blood pressure 97/54, pulse 86, temperature 98.5 F (36.9 C), temperature source Oral, resp. rate 18, height _0  (1.575 m), weight 99.791 kg (220 lb), SpO2 98 %.Body mass index is 40.23 kg/(m^2).  General Appearance: Disheveled  Eye Sport and exercise psychologist::  Fair  Speech:  Slow and Slurred  Volume:  Decreased  Mood:  Anxious  Affect:  Blunt  Thought Process:  Circumstantial, Disorganized and Tangential  Orientation:  Negative  Thought Content:  Negative and Hallucinations: Visual  Suicidal Thoughts:  No  Homicidal Thoughts:  No  Memory:  Immediate;   Fair Recent;   Poor Remote;   Poor  Judgement:  Poor  Insight:  Lacking  Psychomotor Activity:  Decreased  Concentration:  Poor  Recall:  Poor  Fund of Knowledge:Poor  Language: Fair  Akathisia:  No  Handed:  Right  AIMS (if indicated):     Assets:  Financial Resources/Insurance Housing  ADL's:  Impaired  Cognition: Impaired,  Moderate and Severe  Sleep:      Medical Decision Making: Established Problem, Stable/Improving (1), New problem, with additional work up planned, Review of Psycho-Social Stressors (1), Review or order clinical lab tests (1), Discuss test with performing physician (1) and Review of Medication Regimen & Side Effects (2)  Treatment Plan Summary: Plan Patient currently denies suicidal or homicidal ideation. She is not acting out or behaving in a dangerous  or aggressive manner. She continues to report visual hallucinations but patient's she describes the monster as being "blue and its name is Grayland Ormond" it doesn't seem like she is very frightened of it. She is agreeable to going back to the group home. Patient was brought here without commitment paperwork for much collateral information. I don't know what medicine she is taking currently although it is reported that she has diabetes and seizures as well. She can continue current medicine and will follow-up in the community with appropriate outpatient providers. Patient agrees to  the plan. Case discussed with emergency room physician.  Plan:  Patient does not meet criteria for psychiatric inpatient admission. Supportive therapy provided about ongoing stressors. Discussed crisis plan, support from social network, calling 911, coming to the Emergency Department, and calling Suicide Hotline. Disposition: Recommend discharge from the emergency room back to group home follow-up with outpatient provider no change to medication. Labs reviewed. Nothing remarkable requiring any new action  Alethia Berthold 01/14/2015 5:37 PM

## 2015-01-14 NOTE — BH Assessment (Signed)
Assessment Note  Alexandria Harvey is an 34 y.o. female who presents to the ED via group home staff person for c/o, "I'm hearing voices telling her to kill herself."   Axis I: See current hospital problem list 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 Mild mental retardation   . Excessive anger   . Schizophrenia   . Hallucinations   . Diabetes mellitus without complication   . Hypertension   . Seizures    Axis IV: other psychosocial or environmental problems Axis V: 21-30 behavior considerably influenced by delusions or hallucinations OR serious impairment in judgment, communication OR inability to function in almost all areas  Past Medical History:  Past Medical History  Diagnosis Date  . Mild mental retardation   . Excessive anger   . Schizophrenia   . Hallucinations   . Diabetes mellitus without complication   . Hypertension   . Seizures     No past surgical history on file.  Family History: No family history on file.  Social History:  reports that she has never smoked. She does not have any smokeless tobacco history on file. She reports that she does not drink alcohol or use illicit drugs.  Additional Social History:     CIWA: CIWA-Ar BP: (!) 97/54 mmHg (pt sleeping) Pulse Rate: 86 COWS:    Allergies:  Allergies  Allergen Reactions  . Haloperidol Lactate Other (See Comments)    unk  . Shrimp [Shellfish Allergy] Hives and Rash    Home Medications:  (Not in a hospital admission)  OB/GYN Status:  No LMP recorded. Patient has had an injection.  General Assessment Data [facility] Assessment: In system Is this a Tele or Face-to-Face Assessment?: Face-to-Face Is this an Initial Assessment or a Re-assessment for this encounter?: Initial Assessment Marital status: Single Living Arrangements: Other (Comment) Can pt return to current living arrangement?: Yes Admission Status: Voluntary Is  patient capable of signing voluntary admission?: Yes Referral Source:  (group home) Insurance type: Medicare  Medical Screening Exam [facility] Walk-in ONLY) Medical Exam completed: Yes  Crisis Care Plan Living Arrangements: Other (Comment) Name of Psychiatrist: Unknown Name of Therapist: Unknown  Education Status Is patient currently in school?: No Current Grade: n/a Highest grade of school patient has completed: 7211 Name of school: na Contact person: guardian--Roxanna Brand  Risk to self with the past 6 months Suicidal Ideation: Yes-Currently Present Has patient been a risk to self within the past 6 months prior to admission? : Yes Suicidal Intent: Yes-Currently Present (hearing voices telling her to kill herself) Has patient had any suicidal intent within the past 6 months prior to admission? : No Is patient at risk for suicide?: Yes Suicidal Plan?: No Has patient had any suicidal plan within the past 6 months prior to admission? : No Specify Current Suicidal Plan: to tie a shoestring around her neck Access to Means: Yes Specify Access to Suicidal Means: shoestrings What has been your use of drugs/alcohol within the last 12 months?: none Previous Attempts/Gestures: Yes How many times?: 2 Other Self Harm Risks: scratching herself Triggers for Past Attempts: Other personal contacts Intentional Self Injurious Behavior: Damaging Comment - Self Injurious Behavior: scratching Family Suicide History: Unknown Recent stressful life event(s): Conflict (Comment) Persecutory voices/beliefs?: Yes Depression: Yes Depression Symptoms: Despondent Substance abuse history and/or treatment for substance abuse?: No Suicide prevention information given to non-admitted patients: Yes  Risk to Others within the  past 6 months Homicidal Ideation: No Does patient have any lifetime risk of violence toward others beyond the six months prior to admission? : Yes (comment) (aggression toward  others) Thoughts of Harm to Others: No Comment - Thoughts of Harm to Others: currently denies Current Homicidal Intent: No Current Homicidal Plan: No Access to Homicidal Means: No Describe Access to Homicidal Means: client denies access Identified Victim: none History of harm to others?: Yes Assessment of Violence: On admission Violent Behavior Description: hitting peers and staff Does patient have access to weapons?: No Criminal Charges Pending?: No Describe Pending Criminal Charges: none currently Does patient have a court date: No Is patient on probation?: No  Psychosis Hallucinations: Auditory (hearing voices) Delusions: None noted  Mental Status Report Appearance/Hygiene: In scrubs Eye Contact: Fair Motor Activity: Unremarkable Speech: Soft, Slow Level of Consciousness: Quiet/awake Mood: Helpless Affect: Flat Anxiety Level: Minimal Thought Processes: Circumstantial, Relevant Judgement: Impaired Orientation: Person, Place, Situation Obsessive Compulsive Thoughts/Behaviors: None  Cognitive Functioning Concentration: Decreased Memory: Recent Intact IQ: Below Average Level of Function: Mild MR Insight: Poor Impulse Control: Fair Appetite: Good Weight Loss: 0 Weight Gain: 0 Sleep: No Change Total Hours of Sleep: 7 Vegetative Symptoms: None  ADLScreening [facility] Assessment Services) Patient's cognitive ability adequate to safely complete daily activities?: Yes Patient able to express need for assistance with ADLs?: Yes Independently performs ADLs?: Yes (appropriate for developmental age)  Prior Inpatient Therapy Prior Inpatient Therapy: Yes Prior Therapy Dates: 1999-present Prior Therapy Facilty/Provider(s): [facility], [facility], [place], etc. Reason for Treatment: SI, Depression  Prior Outpatient Therapy Prior Outpatient Therapy: Yes Prior Therapy Dates: Ongoing Prior Therapy Facilty/Provider(s): Dr. Lynnell GrainMark Chandler Reason for Treatment: Anger, Mood  dysregulation Does patient have an ACCT team?: No Does patient have Intensive In-House Services?  : No Does patient have [facility] services? : No Does patient have P4CC services?: No  ADL Screening (condition at time of admission) Patient's cognitive ability adequate to safely complete daily activities?: Yes Patient able to express need for assistance with ADLs?: Yes Independently performs ADLs?: Yes (appropriate for developmental age)             Merchant navy officerAdvance Directives (For Healthcare) Does patient have an advance directive?: No Would patient like information on creating an advanced directive?: Yes English as a second language teacher- Educational materials given    Additional Information 1:1 In Past 12 Months?: No CIRT Risk: No Elopement Risk: No Does patient have medical clearance?: Yes  Child/Adolescent Assessment Running Away Risk: Denies Bed-Wetting: Denies Destruction of Property: Denies Cruelty to Animals: Denies Stealing: Denies Rebellious/Defies Authority: Denies Satanic Involvement: Denies Archivistire Setting: Denies Problems at Progress EnergySchool: Denies Gang Involvement: Denies  Disposition:  Disposition Initial Assessment Completed for this Encounter: Yes Disposition of Patient: Outpatient treatment Type of inpatient treatment program: Adult Type of outpatient treatment: Adult Other disposition(s): Other (Comment) (follow up with [facility] with Dr.Chandler)  On Site Evaluation by:   Reviewed with Physician:    Dwan BoltMargaret Atilano Covelli 01/14/2015 11:45 AM

## 2015-01-14 NOTE — ED Notes (Signed)
Pt up to bathroom with steady gait; calm and cooperative

## 2015-01-14 NOTE — ED Notes (Signed)
BEHAVIORAL HEALTH ROUNDING Patient sleeping: No. Patient alert and oriented: yes Behavior appropriate: Yes.  ; If no, describe: Nutrition and fluids offered: Yes  Toileting and hygiene offered: Yes  Sitter present: no Law enforcement present: Yes ods 

## 2015-01-14 NOTE — ED Notes (Signed)

## 2015-01-14 NOTE — ED Notes (Addendum)
BEHAVIORAL HEALTH ROUNDING Patient sleeping: Yes.   Patient alert and oriented: yes Behavior appropriate: Yes.  ; If no, describe: pt is sleeping Nutrition and fluids offered: pt is sleeping Toileting and hygiene offered: pt is sleeping Sitter present: no Law enforcement present: Yes

## 2015-01-14 NOTE — ED Notes (Signed)
Pt ambulatory to bathroom at this time with no concerns.  

## 2015-01-14 NOTE — ED Provider Notes (Signed)
Patient seen by Dr. Toni Amendlapacs who recommends outpatient follow-up with the patient's primary physician. Diagnosis schizophrenia. Patient remained stable in the ER. Patient is resting comfortably, no distress.  Sharyn CreamerMark Ellamay Fors, MD 01/14/15 [phone]

## 2015-01-14 NOTE — Discharge Instructions (Signed)
Schizophrenia  Please follow-up with your primary care doctor and Rh A within the next 1-2 days for reevaluation.  Return to the ER right away should he develop new or concerning symptoms, desire to hurt herself, begin hearing voices that tell you to injure herself or others, or other new concerns or symptoms arise.  Schizophrenia is a mental illness. It may cause disturbed or disorganized thinking, speech, or behavior. People with schizophrenia have problems functioning in one or more areas of life: work, school, home, or relationships. People with schizophrenia are at increased risk for suicide, certain chronic physical illnesses, and unhealthy behaviors, such as smoking and drug use. People who have family members with schizophrenia are at higher risk of developing the illness. Schizophrenia affects men and women equally but usually appears at an earlier age (teenage or early adult years) in men.  SYMPTOMS The earliest symptoms are often subtle (prodrome) and may go unnoticed until the illness becomes more severe (first-break psychosis). Symptoms of schizophrenia may be continuous or may come and go in severity. Episodes often are triggered by major life events, such as family stress, college, [employer] service, marriage, pregnancy or child birth, divorce, or loss of a loved one. People with schizophrenia may see, hear, or feel things that do not exist (hallucinations). They may have false beliefs in spite of obvious proof to the contrary (delusions). Sometimes speech is incoherent or behavior is odd or withdrawn.  DIAGNOSIS Schizophrenia is diagnosed through an assessment by your caregiver. Your caregiver will ask questions about your thoughts, behavior, mood, and ability to function in daily life. Your caregiver may ask questions about your medical history and use of alcohol or drugs, including prescription medication. Your caregiver may also order blood tests and imaging exams. Certain medical  conditions and substances can cause symptoms that resemble schizophrenia. Your caregiver may refer you to a mental health specialist for evaluation. There are three major criterion for a diagnosis of schizophrenia:  Two or more of the following five symptoms are present for a month or longer:  Delusions. Often the delusions are that you are being attacked, harassed, cheated, persecuted or conspired against (persecutory delusions).  Hallucinations.   Disorganized speech that does not make sense to others.  Grossly disorganized (confused or unfocused) behavior or extremely overactive or underactive motor activity (catatonia).  Negative symptoms such as bland or blunted emotions (flat affect), loss of will power (avolition), and withdrawal from social contacts (social isolation).  Level of functioning in one or more major areas of life (work, school, relationships, or self-care) is markedly below the level of functioning before the onset of illness.   There are continuous signs of illness (either mild symptoms or decreased level of functioning) for at least 6 months or longer. TREATMENT  Schizophrenia is a long-term illness. It is best controlled with continuous treatment rather than treatment only when symptoms occur. The following treatments are used to manage schizophrenia:  Medication--Medication is the most effective and important form of treatment for schizophrenia. Antipsychotic medications are usually prescribed to help manage schizophrenia. Other types of medication may be added to relieve any symptoms that may occur despite the use of antipsychotic medications.  Counseling or talk therapy--Individual, group, or family counseling may be helpful in providing education, support, and guidance. Many people with schizophrenia also benefit from social skills and job skills (vocational) training. A combination of medication and counseling is best for managing the disorder over time. A  procedure in which electricity is applied to the  brain through the scalp (electroconvulsive therapy) may be used to treat catatonic schizophrenia or schizophrenia in people who cannot take or do not respond to medication and counseling. Document Released: 08/21/2000 Document Revised: 04/26/2013 Document Reviewed: 11/16/2012 [facility] Patient Information 2015 [employer] [place]. This information is not intended to replace advice given to you by your health care provider. Make sure you discuss any questions you have with your health care provider.

## 2015-01-16 ENCOUNTER — Encounter: Payer: Self-pay | Admitting: *Deleted

## 2015-01-16 ENCOUNTER — Emergency Department
Admission: EM | Admit: 2015-01-16 | Discharge: 2015-01-18 | Disposition: A | Discharge status: Home / Self Care | Payer: Medicaid Other | Attending: Student | Admitting: Student

## 2015-01-16 DIAGNOSIS — F329 Major depressive disorder, single episode, unspecified: Secondary | ICD-10-CM | POA: Insufficient documentation

## 2015-01-16 DIAGNOSIS — E119 Type 2 diabetes mellitus without complications: Secondary | ICD-10-CM | POA: Insufficient documentation

## 2015-01-16 DIAGNOSIS — I10 Essential (primary) hypertension: Secondary | ICD-10-CM | POA: Insufficient documentation

## 2015-01-16 DIAGNOSIS — R440 Auditory hallucinations: Secondary | ICD-10-CM

## 2015-01-16 DIAGNOSIS — Z794 Long term (current) use of insulin: Secondary | ICD-10-CM | POA: Insufficient documentation

## 2015-01-16 DIAGNOSIS — F32A Depression, unspecified: Secondary | ICD-10-CM

## 2015-01-16 DIAGNOSIS — Z3202 Encounter for pregnancy test, result negative: Secondary | ICD-10-CM | POA: Insufficient documentation

## 2015-01-16 DIAGNOSIS — F209 Schizophrenia, unspecified: Secondary | ICD-10-CM | POA: Insufficient documentation

## 2015-01-16 DIAGNOSIS — R441 Visual hallucinations: Secondary | ICD-10-CM

## 2015-01-16 DIAGNOSIS — Z008 Encounter for other general examination: Secondary | ICD-10-CM | POA: Diagnosis present

## 2015-01-16 DIAGNOSIS — Z79899 Other long term (current) drug therapy: Secondary | ICD-10-CM | POA: Diagnosis not present

## 2015-01-16 LAB — COMPREHENSIVE METABOLIC PANEL
ALBUMIN: 3.9 g/dL (ref 3.5–5.0)
ALK PHOS: 67 U/L (ref 38–126)
ALT: 16 U/L (ref 14–54)
AST: 23 U/L (ref 15–41)
Anion gap: 15 (ref 5–15)
BUN: 17 mg/dL (ref 6–20)
CO2: 22 mmol/L (ref 22–32)
Calcium: 9.6 mg/dL (ref 8.9–10.3)
Chloride: 104 mmol/L (ref 101–111)
Creatinine, Ser: 0.73 mg/dL (ref 0.44–1.00)
GFR calc non Af Amer: >60 mL/min (ref >60)
GLUCOSE: 128 mg/dL — AB (ref 65–99)
Potassium: 4 mmol/L (ref 3.5–5.1)
SODIUM: 141 mmol/L (ref 135–145)
TOTAL PROTEIN: 8 g/dL (ref 6.5–8.1)
Total Bilirubin: 0.6 mg/dL (ref 0.3–1.2)

## 2015-01-16 LAB — CBC
HCT: 42 % (ref 35.0–47.0)
HEMOGLOBIN: 13.1 g/dL (ref 12.0–16.0)
MCH: 27 pg (ref 26.0–34.0)
MCHC: 31.2 g/dL — ABNORMAL LOW (ref 32.0–36.0)
MCV: 86.4 fL (ref 80.0–100.0)
PLATELETS: 181 10*3/uL (ref 150–440)
RBC: 4.86 MIL/uL (ref 3.80–5.20)
RDW: 17.9 % — ABNORMAL HIGH (ref 11.5–14.5)
WBC: 13.6 10*3/uL — AB (ref 3.6–11.0)

## 2015-01-16 LAB — ACETAMINOPHEN LEVEL: Acetaminophen (Tylenol), Serum: <10 ug/mL — ABNORMAL LOW (ref 10–30)

## 2015-01-16 LAB — ETHANOL: Alcohol, Ethyl (B): <5 mg/dL (ref <5)

## 2015-01-16 LAB — SALICYLATE LEVEL

## 2015-01-16 NOTE — ED Notes (Signed)
Patient told of need for more urine. Attempted but could not produce urine. Was given diet sprite and told of need for urine.

## 2015-01-16 NOTE — ED Notes (Signed)
Pt not able to urinate at this time, pt given urine cup

## 2015-01-16 NOTE — ED Notes (Signed)

## 2015-01-16 NOTE — ED Notes (Signed)
Lab called and needs additional urine to complete tests.

## 2015-01-16 NOTE — ED Notes (Signed)
States she has history of diabetes, schizophrenia, down syndrome, depression and anxiety.  Hearing voices that tell her to hurt herself, seeing monsters and alligators.

## 2015-01-16 NOTE — ED Notes (Signed)
Pt brought here from the group home manager. Pt was downstairs crying in the basement by the laundry and told the manager that she was hearing voices, voices were telling her to kill herself and she wanted the voices to stop.

## 2015-01-16 NOTE — ED Notes (Signed)
Alexandria SquiresRoxanne Brand is the pt guardian [phone]. [facility], group home manager is Chandra Batcheia Boone [phone]

## 2015-01-16 NOTE — ED Notes (Signed)
BEHAVIORAL HEALTH ROUNDING Patient sleeping: No. Patient alert and oriented: yes Behavior appropriate: Yes.  ; If no, describe:  Nutrition and fluids offered: Yes  Toileting and hygiene offered: Yes  Sitter present: yes Law enforcement present: Yes  

## 2015-01-17 LAB — URINE DRUG SCREEN, QUALITATIVE (ARMC ONLY)
Amphetamines, Ur Screen: NOT DETECTED
Barbiturates, Ur Screen: NOT DETECTED
Benzodiazepine, Ur Scrn: NOT DETECTED
CANNABINOID 50 NG, UR ~~LOC~~: NOT DETECTED
COCAINE METABOLITE, UR ~~LOC~~: NOT DETECTED
MDMA (ECSTASY) UR SCREEN: NOT DETECTED
Methadone Scn, Ur: NOT DETECTED
Opiate, Ur Screen: NOT DETECTED
Phencyclidine (PCP) Ur S: NOT DETECTED
Tricyclic, Ur Screen: NOT DETECTED

## 2015-01-17 LAB — URINALYSIS COMPLETE WITH MICROSCOPIC (ARMC ONLY)
Bacteria, UA: NONE SEEN
Bilirubin Urine: NEGATIVE
GLUCOSE, UA: NEGATIVE mg/dL
Hgb urine dipstick: NEGATIVE
LEUKOCYTES UA: NEGATIVE
NITRITE: NEGATIVE
Protein, ur: NEGATIVE mg/dL
Specific Gravity, Urine: 1.019 (ref 1.005–1.030)
pH: 5 (ref 5.0–8.0)

## 2015-01-17 LAB — GLUCOSE, CAPILLARY: GLUCOSE-CAPILLARY: 154 mg/dL — AB (ref 65–99)

## 2015-01-17 LAB — PREGNANCY, URINE: PREG TEST UR: NEGATIVE

## 2015-01-17 NOTE — ED Notes (Signed)
Zane HeraldAmerica Allen, Higher education careers adviserClinical Coordinator [facility] is at bedside.

## 2015-01-17 NOTE — ED Notes (Signed)
BEHAVIORAL HEALTH ROUNDING Patient sleeping: Yes.   Patient alert and oriented: sleeping Behavior appropriate: Yes.  ; If no, describe:  Nutrition and fluids offered: No Toileting and hygiene offered: No Sitter present: yes Law enforcement present: Yes

## 2015-01-17 NOTE — ED Notes (Signed)
BEHAVIORAL HEALTH ROUNDING Patient sleeping: Yes.   Patient alert and oriented: not applicable Behavior appropriate: Yes.  ; If no, describe:  Nutrition and fluids offered: No Toileting and hygiene offered: Yes  Sitter present: yes Law enforcement present: Yes  

## 2015-01-17 NOTE — ED Notes (Signed)
BEHAVIORAL HEALTH ROUNDING Patient sleeping: No. Patient alert and oriented: yes Behavior appropriate: Yes.  ; If no, describe:  Nutrition and fluids offered: Yes  Toileting and hygiene offered: Yes  Sitter present: yes Law enforcement present: Yes  

## 2015-01-17 NOTE — Progress Notes (Signed)
CSW spoke with Dawn with [facility] [phone] to inform of the patient's disposition.  She reports a Theatre stage managerC[employer] worker will be her on the next business to complete an assessment.    Maryelizabeth Rowanressa Byrant Valent, MSW, LCSW, LCAS Clinical Social Worker [phone]

## 2015-01-17 NOTE — ED Notes (Signed)
BEHAVIORAL HEALTH ROUNDING Patient sleeping: Yes.   Patient alert and oriented: not applicable Behavior appropriate: Yes.   Nutrition and fluids offered: pt sleeping  Toileting and hygiene offered: pt sleeping  Sitter present: yes; 15 min checks  Law enforcement present: Yes   

## 2015-01-17 NOTE — ED Notes (Signed)
BEHAVIORAL HEALTH ROUNDING Patient sleeping: Yes.   Patient alert and oriented: not applicable Behavior appropriate: Yes.   Nutrition and fluids offered: Yes  and breakfast tray at bedside.  Toileting and hygiene offered: Yes  Sitter present: yes 15 min checks  Law enforcement present: Yes

## 2015-01-17 NOTE — ED Notes (Signed)
BEHAVIORAL HEALTH ROUNDING  Patient sleeping: Yes.  Patient alert and oriented: Yes Behavior appropriate: Yes. ; If no, describe:  Nutrition and fluids offered: Yes Toileting and hygiene offered: Yes Sitter present: yes  Law enforcement present: Yes   

## 2015-01-17 NOTE — ED Provider Notes (Signed)
-----------------------------------------   6:13 PM on 01/17/2015 -----------------------------------------   BP 97/60 mmHg  Pulse 106  Temp(Src) 98.4 F (36.9 C) (Oral)  Resp 18  Ht 5\' 2"  (1.575 m)  Wt 200 lb (90.719 kg)  BMI 36.57 kg/m2  SpO2 96%  The patient had no acute events since last update.  Calm and cooperative at this time.  Disposition is pending per Psychiatry/Behavioral Medicine team recommendations.  BP SL LO I UNDERSTAND THESE VITALS WERE TAKEN DURING THE PATIENTS REST PERIOD. WILL FOLLOW.    Arnaldo NatalPaul F Anabela Crayton, MD 01/17/15 [phone]

## 2015-01-17 NOTE — ED Notes (Signed)
BEHAVIORAL HEALTH ROUNDING Patient sleeping: Yes.   Patient alert and oriented: not applicable Behavior appropriate: Yes.  ; If no, describe:  Nutrition and fluids offered: Yes  Toileting and hygiene offered: Yes  Sitter present: yes Law enforcement present: Yes  

## 2015-01-17 NOTE — ED Notes (Signed)
Breakfast Trays passed out to pt's at this time. No acute distress noted.

## 2015-01-17 NOTE — Consult Note (Signed)
[facility] Consult   Reason for Consult:  Consult because of complaints of hallucinations and thoughts about hurting herself Referring Physician:  gayle Patient Identification: Alexandria Harvey MRN:  [id number] Principal Diagnosis: <principal problem not specified> Diagnosis:   Patient Active Problem List   Diagnosis Date Noted  . Continuous auditory hallucinations [R44.0]   . History of command hallucinations [Z86.59]   . Other schizophrenia [F20.89]   . Type 2 diabetes mellitus without complication [Z36.6]   . Visual hallucinations [R44.1]   . Altered mental state [R41.82]   . Hypoglycemia [E16.2] 11/30/2014  . Acute encephalopathy [G93.40] 11/30/2014  . Overdose [T50.901A] 11/30/2014  . Hypoglycemia due to insulin [E16.0, T38.3X5A] 11/30/2014  . MR (mental retardation), moderate [F71]   . Aggressive behavior [F60.89]   . Schizoaffective disorder, unspecified type [F25.9]   . Outbursts of anger [F91.1]   . Suicidal ideation [R45.851]   . Schizoaffective disorder [F25.9] 10/06/2014  . Suicidal ideations [R45.851] 10/06/2014  . Mental retardation [F79]   . EAR PAIN, RIGHT [H92.09] 11/27/2010  . DYSURIA [R30.0] 07/29/2010  . CALLUS, FOOT [L84] 04/28/2010  . BACTERIAL VAGINITIS [N76.0] 01/20/2010  . ALLERGIC RHINITIS [J30.9] 12/19/2009  . VAGINITIS, CANDIDAL [B37.3] 10/28/2009  . PRURITUS, VAGINAL [L29.3] 10/28/2009  . DYSPEPSIA [K30] 09/26/2009  . CONSTIPATION [K59.00] 09/26/2009  . TINEA PEDIS [B35.3] 12/17/2008  . 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 T2DM (type 2 diabetes mellitus) [E11.9] 01/08/2007  . HYPERLIPIDEMIA [E78.5] 01/08/2007  . 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 MENTAL RETARDATION [F79] 01/08/2007  . VISUAL HALLUCINATION [H53.16] 01/08/2007  . Essential hypertension [I10] 01/08/2007  . Seizure disorder [G40.909] 01/08/2007  . Aggressive behavior of adult [F60.89] 01/08/2007    Total  Time spent with patient: 45 minutes  Subjective:   Alexandria Harvey is a 34 y.o. female patient admitted with "I've been hearing things and seeing things".  HPI:  History obtained from the patient and the chart. No commitment papers present. Patient says that she's been hearing things and seeing things. Seeing monsters and hearing voices that tell her to hurt herself. Mood is sad. Energy low. Not sleeping well at night. Patient is not able to articulate any specific stress. Says the group home has not been treating her badly. Says that she's been compliant with her medicine. She is anxious because she was about to move into a new living situation within the next day or 2. Denies substance abuse. Patient was just here in our emergency room 3 days ago with similar complaints but she seems to be worse now. Says the hallucinations are really bothering her and she is afraid of hurting herself.  Past history positive for multiple psychiatric hospitalizations. Recently discharged from [facility] Has been seen: Behavioral health as well. Developmental disability apparently and schizophrenia or schizoaffective disorder. Has been responsive to medication the past.  Social history as the patient just recently was discharged from an inpatient hospital. Currently he had a new group home. She says her guardian was getting ready to place her in another new place soon.  Family history: Patient's unclear about that.  Medical history positive for diabetes hypertension seizures. No acute medical complaints  Substance abuse history is negative by her report HPI Elements:   Quality:  Auditory and visual hallucinations with commands to harm herself. Severity:  Moderate to severe. Timing:  Getting worse over the last couple days. Duration:  Intermittent. Context:  Living situation is about to change .  Past Medical  History:  Past Medical History  Diagnosis Date  . Mild mental retardation   . Excessive anger   .  Schizophrenia   . Hallucinations   . Diabetes mellitus without complication   . Hypertension   . Seizures    History reviewed. No pertinent past surgical history. Family History: No family history on file. Social History:  History  Alcohol Use No     History  Drug Use No    History   Social History  . Marital Status: Single    Spouse Name: N/A  . Number of Children: N/A  . Years of Education: N/A   Social History Main Topics  . Smoking status: Never Smoker   . Smokeless tobacco: Not on file  . Alcohol Use: No  . Drug Use: No  . Sexual Activity: Not on file   Other Topics Concern  . None   Social History Narrative   Additional Social History:                          Allergies:   Allergies  Allergen Reactions  . Haloperidol Lactate Other (See Comments)    unk  . Shrimp [Shellfish Allergy] Hives and Rash    Labs:  Results for orders placed or performed during the hospital encounter of 01/16/15 (from the past 48 hour(s))  Acetaminophen level     Status: Abnormal   Collection Time: 01/16/15  9:20 PM  Result Value Ref Range   Acetaminophen (Tylenol), Serum <10 (L) 10 - 30 ug/mL    Comment:        THERAPEUTIC CONCENTRATIONS VARY SIGNIFICANTLY. A RANGE OF 10-30 ug/mL MAY BE AN EFFECTIVE CONCENTRATION FOR MANY PATIENTS. HOWEVER, SOME ARE BEST TREATED AT CONCENTRATIONS OUTSIDE THIS RANGE. ACETAMINOPHEN CONCENTRATIONS >150 ug/mL AT 4 HOURS AFTER INGESTION AND >50 ug/mL AT 12 HOURS AFTER INGESTION ARE OFTEN ASSOCIATED WITH TOXIC REACTIONS.   CBC     Status: Abnormal   Collection Time: 01/16/15  9:20 PM  Result Value Ref Range   WBC 13.6 (H) 3.6 - 11.0 K/uL   RBC 4.86 3.80 - 5.20 MIL/uL   Hemoglobin 13.1 12.0 - 16.0 g/dL   HCT 42.0 35.0 - 47.0 %   MCV 86.4 80.0 - 100.0 fL   MCH 27.0 26.0 - 34.0 pg   MCHC 31.2 (L) 32.0 - 36.0 g/dL   RDW 17.9 (H) 11.5 - 14.5 %   Platelets 181 150 - 440 K/uL  Comprehensive metabolic panel     Status: Abnormal    Collection Time: 01/16/15  9:20 PM  Result Value Ref Range   Sodium 141 135 - 145 mmol/L   Potassium 4.0 3.5 - 5.1 mmol/L   Chloride 104 101 - 111 mmol/L   CO2 22 22 - 32 mmol/L   Glucose, Bld 128 (H) 65 - 99 mg/dL   BUN 17 6 - 20 mg/dL   Creatinine, Ser 0.73 0.44 - 1.00 mg/dL   Calcium 9.6 8.9 - 10.3 mg/dL   Total Protein 8.0 6.5 - 8.1 g/dL   Albumin 3.9 3.5 - 5.0 g/dL   AST 23 15 - 41 U/L   ALT 16 14 - 54 U/L   Alkaline Phosphatase 67 38 - 126 U/L   Total Bilirubin 0.6 0.3 - 1.2 mg/dL   GFR calc non Af Amer >60 >60 mL/min   GFR calc Af Amer >60 >60 mL/min    Comment: (NOTE) The eGFR has been calculated using the CKD  EPI equation. This calculation has not been validated in all clinical situations. eGFR's persistently <60 mL/min signify possible Chronic Kidney Disease.    Anion gap 15 5 - 15  Ethanol (ETOH)     Status: None   Collection Time: 01/16/15  9:20 PM  Result Value Ref Range   Alcohol, Ethyl (B) <5 <5 mg/dL    Comment:        LOWEST DETECTABLE LIMIT FOR SERUM ALCOHOL IS 11 mg/dL FOR MEDICAL PURPOSES ONLY   Salicylate level     Status: None   Collection Time: 01/16/15  9:20 PM  Result Value Ref Range   Salicylate Lvl <6.6 2.8 - 30.0 mg/dL  Urine Drug Screen, Qualitative [facility])     Status: None   Collection Time: 01/17/15  9:15 AM  Result Value Ref Range   Tricyclic, Ur Screen NONE DETECTED NONE DETECTED   Amphetamines, Ur Screen NONE DETECTED NONE DETECTED   MDMA (Ecstasy)Ur Screen NONE DETECTED NONE DETECTED   Cocaine Metabolite,Ur [place] NONE DETECTED NONE DETECTED   Opiate, Ur Screen NONE DETECTED NONE DETECTED   Phencyclidine (PCP) Ur S NONE DETECTED NONE DETECTED   Cannabinoid 50 Ng, Ur  NONE DETECTED NONE DETECTED   Barbiturates, Ur Screen NONE DETECTED NONE DETECTED   Benzodiazepine, Ur Scrn NONE DETECTED NONE DETECTED   Methadone Scn, Ur NONE DETECTED NONE DETECTED    Comment: (NOTE) 060  Tricyclics, urine               Cutoff 1000 ng/mL 200   Amphetamines, urine             Cutoff 1000 ng/mL 300  MDMA (Ecstasy), urine           Cutoff 500 ng/mL 400  Cocaine Metabolite, urine       Cutoff 300 ng/mL 500  Opiate, urine                   Cutoff 300 ng/mL 600  Phencyclidine (PCP), urine      Cutoff 25 ng/mL 700  Cannabinoid, urine              Cutoff 50 ng/mL 800  Barbiturates, urine             Cutoff 200 ng/mL 900  Benzodiazepine, urine           Cutoff 200 ng/mL 1000 Methadone, urine                Cutoff 300 ng/mL 1100 1200 The urine drug screen provides only a preliminary, unconfirmed 1300 analytical test result and should not be used for non-medical 1400 purposes. Clinical consideration and professional judgment should 1500 be applied to any positive drug screen result due to possible 1600 interfering substances. A more specific alternate chemical method 1700 must be used in order to obtain a confirmed analytical result.  1800 Gas chromato graphy / mass spectrometry (GC/MS) is the preferred 1900 confirmatory method.   Urinalysis complete, with microscopic [facility])     Status: Abnormal   Collection Time: 01/17/15  9:15 AM  Result Value Ref Range   Color, Urine YELLOW (A) YELLOW   APPearance CLEAR (A) CLEAR   Glucose, UA NEGATIVE NEGATIVE mg/dL   Bilirubin Urine NEGATIVE NEGATIVE   Ketones, ur 1+ (A) NEGATIVE mg/dL   Specific Gravity, Urine 1.019 1.005 - 1.030   Hgb urine dipstick NEGATIVE NEGATIVE   pH 5.0 5.0 - 8.0   Protein, ur NEGATIVE NEGATIVE mg/dL   Nitrite NEGATIVE NEGATIVE  Leukocytes, UA NEGATIVE NEGATIVE   RBC / HPF 0-5 0 - 5 RBC/hpf   WBC, UA 0-5 0 - 5 WBC/hpf   Bacteria, UA NONE SEEN NONE SEEN   Squamous Epithelial / LPF 0-5 (A) NONE SEEN   Mucous PRESENT   Pregnancy, urine (if pre-menopausal female)     Status: None   Collection Time: 01/17/15  9:15 AM  Result Value Ref Range   Preg Test, Ur NEGATIVE NEGATIVE    Vitals: Blood pressure 97/60, pulse 106, temperature 98.4 F (36.9 C), temperature  source Oral, resp. rate 18, height $RemoveBe'5\' 2"'NuCtYFOJf$  (1.575 m), weight 90.719 kg (200 lb), SpO2 96 %.  Risk to Self: Is patient at risk for suicide?: Yes Risk to Others:   Prior Inpatient Therapy:   Prior Outpatient Therapy:    No current facility-administered medications for this encounter.   Current Outpatient Prescriptions  Medication Sig Dispense Refill  . atenolol (TENORMIN) 50 MG tablet Take 50 mg by mouth daily.    Marland Kitchen atorvastatin (LIPITOR) 80 MG tablet Take 80 mg by mouth at bedtime.    . bisacodyl (DULCOLAX) 5 MG EC tablet Take 10 mg by mouth daily as needed for moderate constipation.    . Blood Glucose Monitoring Suppl (ONE TOUCH ULTRA 2) W/DEVICE KIT Monitor blood sugars at least 2 times daily. 1 each 0  . cloZAPine (CLOZARIL) 100 MG tablet Take 100-200 mg by mouth 2 (two) times daily. Take $RemoveBef'100mg'AQbKNiZsvI$  in the morning and $RemoveBef'200mg'LQiDTHOTpq$  at bedtime    . divalproex (DEPAKOTE) 500 MG DR tablet Take 500 mg by mouth 3 (three) times daily.     Marland Kitchen docusate sodium (COLACE) 100 MG capsule Take 100 mg by mouth 2 (two) times daily.    Marland Kitchen glucose blood (ONE TOUCH ULTRA TEST) test strip Use as instructed 100 each 12  . insulin aspart (NOVOLOG) 100 UNIT/ML FlexPen Inject into the skin 4 (four) times daily -  before meals and at bedtime. Sliding scale    . ipratropium (ATROVENT) 0.03 % nasal spray Place 1 spray under the tongue 2 (two) times daily.     . Lancets (ONETOUCH ULTRASOFT) lancets Use as instructed 100 each 12  . lisinopril (PRINIVIL,ZESTRIL) 2.5 MG tablet Take 2.5 mg by mouth daily.    . medroxyPROGESTERone (DEPO-PROVERA) 150 MG/ML injection Inject 150 mg into the muscle every 3 (three) months.    . metFORMIN (GLUCOPHAGE) 500 MG tablet Take 1,000 mg by mouth 2 (two) times daily with a meal.    . omeprazole (PRILOSEC) 20 MG capsule Take 40 mg by mouth daily.    Marland Kitchen selenium sulfide (SELSUN) 2.5 % shampoo Apply 1 application topically every Wednesday.      Musculoskeletal: Strength & Muscle Tone: within normal  limits Gait & Station: normal Patient leans: N/A  Psychiatric Specialty Exam: Physical Exam  Constitutional: She appears well-developed.  HENT:  Head: Normocephalic.  Eyes: Pupils are equal, round, and reactive to light.  Neck: Normal range of motion.  Respiratory: Effort normal.  Musculoskeletal: Normal range of motion.  Neurological: She is alert.  Skin: Skin is warm and dry.  Psychiatric: Her mood appears anxious. Her speech is delayed and slurred. She is slowed, withdrawn and actively hallucinating. Thought content is paranoid. Cognition and memory are impaired. She expresses inappropriate judgment. She exhibits abnormal recent memory and abnormal remote memory.    Review of Systems  Constitutional: Negative.   HENT: Negative.   Eyes: Negative.   Respiratory: Negative.   Cardiovascular: Negative.  Gastrointestinal: Negative.   Musculoskeletal: Negative.   Skin: Negative.   Neurological: Negative.   Psychiatric/Behavioral: Positive for depression, suicidal ideas, hallucinations and memory loss. Negative for substance abuse. The patient is nervous/anxious.     Blood pressure 97/60, pulse 106, temperature 98.4 F (36.9 C), temperature source Oral, resp. rate 18, height $RemoveBe'5\' 2"'NkNuardSt$  (1.575 m), weight 90.719 kg (200 lb), SpO2 96 %.Body mass index is 36.57 kg/(m^2).  General Appearance: Disheveled  Eye Contact::  Absent  Speech:  Blocked  Volume:  Decreased  Mood:  Anxious  Affect:  Blunt  Thought Process:  Circumstantial and Loose  Orientation:  Full (Time, Place, and Person)  Thought Content:  Hallucinations: Auditory Command:  Says they tell her to kill herself and Paranoid Ideation  Suicidal Thoughts:  Yes.  without intent/plan  Homicidal Thoughts:  No  Memory:  Immediate;   Good Recent;   Fair Remote;   Fair  Judgement:  Impaired  Insight:  Shallow  Psychomotor Activity:  Normal  Concentration:  Negative  Recall:  [employer] of Knowledge:Fair  Language: Fair   Akathisia:  No  Handed:  Right  AIMS (if indicated):     Assets:  Financial Resources/Insurance Intimacy  ADL's:  Intact  Cognition: Impaired,  Moderate  Sleep:      Medical Decision Making: Established Problem, Worsening (2), Review or order medicine tests (1) and Review of Medication Regimen & Side Effects (2)  Treatment Plan Summary: Plan Patient is now reporting hallucinations telling her to kill herself. More withdrawn and dysphoric and on interactive than she was the other day. Patient will be admitted to the psychiatry ward. Continue current medication. Monitor lab studies. Treatment team can work on possible changes and evaluating her social situation. Patient agrees to plan.  Plan:  Recommend psychiatric Inpatient admission when medically cleared. Supportive therapy provided about ongoing stressors. Discussed crisis plan, support from social network, calling 911, coming to the Emergency Department, and calling Suicide Hotline. Disposition: Admitted to psychiatry  Alethia Berthold 01/17/2015 5:20 PM

## 2015-01-17 NOTE — ED Notes (Addendum)
BEHAVIORAL HEALTH ROUNDING Patient sleeping: No. Patient alert and oriented: yes Behavior appropriate: Yes.  ; If no, describe:  Nutrition and fluids offered: Yes  Toileting and hygiene offered: Yes  Sitter present: yes Law enforcement present: Yes  

## 2015-01-17 NOTE — ED Notes (Signed)

## 2015-01-17 NOTE — ED Provider Notes (Signed)
[facility] Emergency Department Provider Note  ____________________________________________  Time seen: Approximately 0015 AM  I have reviewed the triage vital signs and the nursing notes.   HISTORY  Chief Complaint Psychiatric Evaluation  History limited by mental retardation.  HPI Alexandria Harvey is a 34 y.o. female with a history of mental retardation and schizophrenia who returns to the emergency departmentvia group home staff who found patient downstairs crying in the basement. Patient states she is hearing voices who are telling her to kill herself. She is also seeing monsters and alligators who are frightening her.  Patient does have a medical history of diabetes and seizures. Currently voices no complaints at this time.   Past Medical History  Diagnosis Date  . Mild mental retardation   . Excessive anger   . Schizophrenia   . Hallucinations   . Diabetes mellitus without complication   . Hypertension   . Seizures     Patient Active Problem List   Diagnosis Date Noted  . Continuous auditory hallucinations   . History of command hallucinations   . Other schizophrenia   . Type 2 diabetes mellitus without complication   . Visual hallucinations   . Altered mental state   . Hypoglycemia 11/30/2014  . Acute encephalopathy 11/30/2014  . Overdose 11/30/2014  . Hypoglycemia due to insulin 11/30/2014  . MR (mental retardation), moderate   . Aggressive behavior   . Schizoaffective disorder, unspecified type   . Outbursts of anger   . Suicidal ideation   . Schizoaffective disorder 10/06/2014  . Suicidal ideations 10/06/2014  . Mental retardation   . EAR PAIN, RIGHT 11/27/2010  . DYSURIA 07/29/2010  . CALLUS, FOOT 04/28/2010  . BACTERIAL VAGINITIS 01/20/2010  . ALLERGIC RHINITIS 12/19/2009  . VAGINITIS, CANDIDAL 10/28/2009  . PRURITUS, VAGINAL 10/28/2009  . DYSPEPSIA 09/26/2009  . CONSTIPATION 09/26/2009  . TINEA PEDIS 12/17/2008  .  UNSPECIFIED SCHIZOPHRENIA CHRONIC CONDITION 11/15/2008  . T2DM (type 2 diabetes mellitus) 01/08/2007  . HYPERLIPIDEMIA 01/08/2007  . DEPRESSIVE DSORD, RCR SEVR W/PSYCT BHVR 01/08/2007  . MENTAL RETARDATION 01/08/2007  . VISUAL HALLUCINATION 01/08/2007  . Essential hypertension 01/08/2007  . Seizure disorder 01/08/2007  . Aggressive behavior of adult 01/08/2007    History reviewed. No pertinent past surgical history.  Current Outpatient Rx  Name  Route  Sig  Dispense  Refill  . atenolol (TENORMIN) 50 MG tablet   Oral   Take 50 mg by mouth daily.         Marland Kitchen atorvastatin (LIPITOR) 80 MG tablet   Oral   Take 80 mg by mouth at bedtime.         . bisacodyl (DULCOLAX) 5 MG EC tablet   Oral   Take 10 mg by mouth daily as needed for moderate constipation.         . Blood Glucose Monitoring Suppl (ONE TOUCH ULTRA 2) W/DEVICE KIT      Monitor blood sugars at least 2 times daily.   1 each   0   . cloZAPine (CLOZARIL) 100 MG tablet   Oral   Take 100-200 mg by mouth 2 (two) times daily. Take $RemoveBef'100mg'LVjpHcsooO$  in the morning and $RemoveBef'200mg'kbBRykKsix$  at bedtime         . divalproex (DEPAKOTE) 500 MG DR tablet   Oral   Take 500 mg by mouth 3 (three) times daily.          Marland Kitchen docusate sodium (COLACE) 100 MG capsule   Oral   Take 100  mg by mouth 2 (two) times daily.         Marland Kitchen glucose blood (ONE TOUCH ULTRA TEST) test strip      Use as instructed   100 each   12   . insulin aspart (NOVOLOG) 100 UNIT/ML FlexPen   Subcutaneous   Inject into the skin 4 (four) times daily -  before meals and at bedtime. Sliding scale         . ipratropium (ATROVENT) 0.03 % nasal spray   Sublingual   Place 1 spray under the tongue 2 (two) times daily.          . Lancets (ONETOUCH ULTRASOFT) lancets      Use as instructed   100 each   12   . lisinopril (PRINIVIL,ZESTRIL) 2.5 MG tablet   Oral   Take 2.5 mg by mouth daily.         . medroxyPROGESTERone (DEPO-PROVERA) 150 MG/ML injection    Intramuscular   Inject 150 mg into the muscle every 3 (three) months.         . metFORMIN (GLUCOPHAGE) 500 MG tablet   Oral   Take 1,000 mg by mouth 2 (two) times daily with a meal.         . omeprazole (PRILOSEC) 20 MG capsule   Oral   Take 40 mg by mouth daily.         Marland Kitchen selenium sulfide (SELSUN) 2.5 % shampoo   Topical   Apply 1 application topically every Wednesday.           Allergies Haloperidol lactate and Shrimp  No family history on file.  Social History History  Substance Use Topics  . Smoking status: Never Smoker   . Smokeless tobacco: Not on file  . Alcohol Use: No    Review of Systems Constitutional: No fever/chills Eyes: No visual changes. ENT: No sore throat. Cardiovascular: Denies chest pain. Respiratory: Denies shortness of breath. Gastrointestinal: No abdominal pain.  No nausea, no vomiting.  No diarrhea.  No constipation. Genitourinary: Negative for dysuria. Musculoskeletal: Negative for back pain. Skin: Negative for rash. Neurological: Negative for headaches, focal weakness or numbness. Psychiatric:Positive for depression. Positive for auditory and visual hallucinations.  10-point ROS otherwise negative.  ____________________________________________   PHYSICAL EXAM:  VITAL SIGNS: ED Triage Vitals  Enc Vitals Group     BP 01/16/15 2124 120/65 mmHg     Pulse Rate 01/16/15 2124 110     Resp 01/16/15 2124 16     Temp 01/16/15 2124 97.9 F (36.6 C)     Temp Source 01/16/15 2124 Oral     SpO2 01/16/15 2124 100 %     Weight 01/16/15 2124 200 lb (90.719 kg)     Height 01/16/15 2124 $RemoveBefor'5\' 2"'NEAjnyiMnPcR$  (1.575 m)     Head Cir --      Peak Flow --      Pain Score 01/17/15 0047 0     Pain Loc --      Pain Edu? --      Excl. in [place]? --     Constitutional: Alert and oriented. Well appearing and in no acute distress. Eyes: Conjunctivae are normal. PERRL. EOMI. Head: Atraumatic. Nose: No congestion/rhinnorhea. Mouth/Throat: Mucous membranes are  moist.  Oropharynx non-erythematous. Neck: No stridor.   Cardiovascular: Normal rate, regular rhythm. Grossly normal heart sounds.  Good peripheral circulation. Respiratory: Normal respiratory effort.  No retractions. Lungs CTAB. Gastrointestinal: Soft and nontender. No distention. No abdominal bruits. No CVA tenderness.  Musculoskeletal: No lower extremity tenderness nor edema.  No joint effusions. Neurologic:  Normal speech and language. No gross focal neurologic deficits are appreciated. Speech is normal. No gait instability. Skin:  Skin is warm, dry and intact. No rash noted. Psychiatric: Mood and affect are depressed. Mild cognitive impairment.  ____________________________________________   LABS (all labs ordered are listed, but only abnormal results are displayed)  Labs Reviewed  ACETAMINOPHEN LEVEL - Abnormal; Notable for the following:    Acetaminophen (Tylenol), Serum <10 (*)    All other components within normal limits  CBC - Abnormal; Notable for the following:    WBC 13.6 (*)    MCHC 31.2 (*)    RDW 17.9 (*)    All other components within normal limits  COMPREHENSIVE METABOLIC PANEL - Abnormal; Notable for the following:    Glucose, Bld 128 (*)    All other components within normal limits  ETHANOL  SALICYLATE LEVEL  URINE DRUG SCREEN, QUALITATIVE ([facility])  URINALYSIS COMPLETEWITH MICROSCOPIC ([facility])   PREGNANCY, URINE   ____________________________________________  EKG  None ____________________________________________  RADIOLOGY  None ____________________________________________   PROCEDURES  Procedure(s) performed: None  Critical Care performed: No  ____________________________________________   INITIAL IMPRESSION / ASSESSMENT AND PLAN / ED COURSE  Pertinent labs & imaging results that were available during my care of the patient were reviewed by me and considered in my medical decision making (see chart for details).  34 year old female with  history of schizophrenia and Down syndrome who was recently evaluated and released by psychiatry. Patient presents again for auditory and visual hallucinations. Patient contracts for safety while in the emergency department. Patient is medically clear at this time pending behavioral medicine/psychiatry evaluation.  ----------------------------------------- 7:35 AM on 01/17/2015 -----------------------------------------  Patient currently resting in no acute distress. Awaiting psychiatry evaluation this morning. Care transferred to Dr. Edd Fabian. ____________________________________________   FINAL CLINICAL IMPRESSION(S) / ED DIAGNOSES  Final diagnoses:  Schizophrenia, unspecified type  Auditory hallucinations  Continuous visual hallucinations  Depression      Paulette Blanch, MD 01/17/15 [phone]

## 2015-01-18 ENCOUNTER — Inpatient Hospital Stay
Admit: 2015-01-18 | Discharge: 2015-01-25 | DRG: 885 | Payer: Medicare Other | Source: Ambulatory Visit | Attending: Psychiatry | Admitting: Psychiatry

## 2015-01-18 DIAGNOSIS — G40909 Epilepsy, unspecified, not intractable, without status epilepticus: Secondary | ICD-10-CM | POA: Diagnosis present

## 2015-01-18 DIAGNOSIS — F71 Moderate intellectual disabilities: Secondary | ICD-10-CM | POA: Diagnosis present

## 2015-01-18 DIAGNOSIS — E119 Type 2 diabetes mellitus without complications: Secondary | ICD-10-CM | POA: Diagnosis present

## 2015-01-18 DIAGNOSIS — K5900 Constipation, unspecified: Secondary | ICD-10-CM | POA: Diagnosis present

## 2015-01-18 DIAGNOSIS — E785 Hyperlipidemia, unspecified: Secondary | ICD-10-CM | POA: Diagnosis present

## 2015-01-18 DIAGNOSIS — Z79899 Other long term (current) drug therapy: Secondary | ICD-10-CM | POA: Diagnosis not present

## 2015-01-18 DIAGNOSIS — I10 Essential (primary) hypertension: Secondary | ICD-10-CM | POA: Diagnosis present

## 2015-01-18 DIAGNOSIS — F251 Schizoaffective disorder, depressive type: Secondary | ICD-10-CM | POA: Diagnosis present

## 2015-01-18 DIAGNOSIS — Z833 Family history of diabetes mellitus: Secondary | ICD-10-CM | POA: Diagnosis not present

## 2015-01-18 DIAGNOSIS — K219 Gastro-esophageal reflux disease without esophagitis: Secondary | ICD-10-CM | POA: Diagnosis present

## 2015-01-18 LAB — GLUCOSE, CAPILLARY
Glucose-Capillary: 197 mg/dL — ABNORMAL HIGH (ref 65–99)
Glucose-Capillary: 127 mg/dL — ABNORMAL HIGH (ref 65–99)

## 2015-01-18 LAB — LIPID PANEL
CHOL/HDL RATIO: 8 ratio
Cholesterol: 200 mg/dL (ref 0–200)
HDL: 25 mg/dL — ABNORMAL LOW (ref >40)
LDL CALC: 148 mg/dL — AB (ref 0–99)
Triglycerides: 135 mg/dL (ref <150)
VLDL: 27 mg/dL (ref 0–40)

## 2015-01-18 LAB — VALPROIC ACID LEVEL: Valproic Acid Lvl: 16 ug/mL — ABNORMAL LOW (ref 50.0–100.0)

## 2015-01-18 MED ORDER — DOCUSATE SODIUM 100 MG PO CAPS
100.0000 mg | ORAL_CAPSULE | Freq: Two times a day (BID) | ORAL | Status: DC
  Administered 2015-01-18 – 2015-01-21 (×7): 100 mg via ORAL
  Filled 2015-01-18 (×7): qty 1

## 2015-01-18 MED ORDER — CLOZAPINE 100 MG PO TABS
125.0000 mg | ORAL_TABLET | Freq: Every day | ORAL | Status: DC
  Administered 2015-01-18 – 2015-01-21 (×4): 125 mg via ORAL
  Filled 2015-01-18 (×4): qty 1

## 2015-01-18 MED ORDER — DIVALPROEX SODIUM ER 500 MG PO TB24
500.0000 mg | ORAL_TABLET | Freq: Two times a day (BID) | ORAL | Status: DC
  Administered 2015-01-18: 500 mg via ORAL
  Filled 2015-01-18: qty 1

## 2015-01-18 MED ORDER — ATORVASTATIN CALCIUM 80 MG PO TABS
80.0000 mg | ORAL_TABLET | Freq: Every day | ORAL | Status: DC
  Filled 2015-01-18: qty 1

## 2015-01-18 MED ORDER — LORAZEPAM 2 MG PO TABS: 2.0000 mg | ORAL_TABLET | Freq: Four times a day (QID) | ORAL | Status: DC | PRN

## 2015-01-18 MED ORDER — LISINOPRIL 5 MG PO TABS
2.5000 mg | ORAL_TABLET | Freq: Every day | ORAL | Status: DC
  Administered 2015-01-18: 2.5 mg via ORAL
  Filled 2015-01-18: qty 1

## 2015-01-18 MED ORDER — MAGNESIUM HYDROXIDE 400 MG/5ML PO SUSP: 30.0000 mL | Freq: Every day | ORAL | Status: DC | PRN

## 2015-01-18 MED ORDER — DIVALPROEX SODIUM 500 MG PO DR TAB
500.0000 mg | DELAYED_RELEASE_TABLET | Freq: Three times a day (TID) | ORAL | Status: DC
  Administered 2015-01-18 – 2015-01-25 (×21): 500 mg via ORAL
  Filled 2015-01-18 (×22): qty 1

## 2015-01-18 MED ORDER — ACETAMINOPHEN 325 MG PO TABS: 650.0000 mg | ORAL_TABLET | Freq: Four times a day (QID) | ORAL | Status: DC | PRN

## 2015-01-18 MED ORDER — SELENIUM SULFIDE 2.5 % EX LOTN
1.0000 "application " | TOPICAL_LOTION | Freq: Every day | CUTANEOUS | Status: DC | PRN
  Filled 2015-01-18: qty 118

## 2015-01-18 MED ORDER — ATENOLOL 50 MG PO TABS
50.0000 mg | ORAL_TABLET | Freq: Every day | ORAL | Status: DC
  Administered 2015-01-19 – 2015-01-25 (×7): 50 mg via ORAL
  Filled 2015-01-18 (×7): qty 1

## 2015-01-18 MED ORDER — DOCUSATE SODIUM 100 MG PO CAPS
100.0000 mg | ORAL_CAPSULE | Freq: Two times a day (BID) | ORAL | Status: DC
Start: 2015-01-18 — End: 2015-01-18

## 2015-01-18 MED ORDER — BISACODYL 5 MG PO TBEC
5.0000 mg | DELAYED_RELEASE_TABLET | Freq: Every day | ORAL | Status: DC | PRN
  Filled 2015-01-18: qty 1

## 2015-01-18 MED ORDER — LISINOPRIL 5 MG PO TABS
2.5000 mg | ORAL_TABLET | Freq: Every day | ORAL | Status: DC
  Administered 2015-01-19 – 2015-01-24 (×6): 2.5 mg via ORAL
  Administered 2015-01-25: 5 mg via ORAL
  Filled 2015-01-18 (×8): qty 1

## 2015-01-18 MED ORDER — CLOZAPINE 100 MG PO TABS: 100.0000 mg | ORAL_TABLET | Freq: Every day | ORAL | Status: DC

## 2015-01-18 MED ORDER — ALUM & MAG HYDROXIDE-SIMETH 200-200-20 MG/5ML PO SUSP: 30.0000 mL | ORAL | Status: DC | PRN

## 2015-01-18 MED ORDER — PANTOPRAZOLE SODIUM 40 MG PO TBEC
40.0000 mg | DELAYED_RELEASE_TABLET | Freq: Every day | ORAL | Status: DC
  Administered 2015-01-19 – 2015-01-25 (×7): 40 mg via ORAL
  Filled 2015-01-18 (×7): qty 1

## 2015-01-18 MED ORDER — IPRATROPIUM BROMIDE 0.03 % NA SOLN
1.0000 | Freq: Two times a day (BID) | NASAL | Status: DC | PRN
  Filled 2015-01-18: qty 30

## 2015-01-18 MED ORDER — CLOZAPINE 25 MG PO TABS
25.0000 mg | ORAL_TABLET | Freq: Every day | ORAL | Status: DC
  Administered 2015-01-19 – 2015-01-20 (×2): 25 mg via ORAL
  Filled 2015-01-18 (×4): qty 1

## 2015-01-18 MED ORDER — METFORMIN HCL 500 MG PO TABS
500.0000 mg | ORAL_TABLET | Freq: Two times a day (BID) | ORAL | Status: DC
  Administered 2015-01-18: 500 mg via ORAL
  Filled 2015-01-18: qty 1

## 2015-01-18 MED ORDER — ATORVASTATIN CALCIUM 20 MG PO TABS
80.0000 mg | ORAL_TABLET | Freq: Every day | ORAL | Status: DC
  Administered 2015-01-18 – 2015-01-24 (×7): 80 mg via ORAL
  Filled 2015-01-18 (×5): qty 4
  Filled 2015-01-18: qty 1
  Filled 2015-01-18 (×2): qty 4

## 2015-01-18 MED ORDER — METFORMIN HCL 500 MG PO TABS
1000.0000 mg | ORAL_TABLET | Freq: Two times a day (BID) | ORAL | Status: DC
  Administered 2015-01-18 – 2015-01-25 (×14): 1000 mg via ORAL
  Filled 2015-01-18 (×14): qty 2

## 2015-01-18 MED ORDER — PANTOPRAZOLE SODIUM 40 MG PO TBEC
40.0000 mg | DELAYED_RELEASE_TABLET | Freq: Every day | ORAL | Status: DC
  Administered 2015-01-18: 40 mg via ORAL
  Filled 2015-01-18: qty 1

## 2015-01-18 MED ORDER — BISACODYL 5 MG PO TBEC: 10.0000 mg | DELAYED_RELEASE_TABLET | Freq: Every day | ORAL | Status: DC | PRN

## 2015-01-18 MED ORDER — ATENOLOL 50 MG PO TABS
50.0000 mg | ORAL_TABLET | Freq: Every day | ORAL | Status: DC
  Administered 2015-01-18: 50 mg via ORAL
  Filled 2015-01-18: qty 1

## 2015-01-18 NOTE — ED Provider Notes (Signed)
-----------------------------------------   2:51 AM on 01/18/2015 -----------------------------------------   BP 126/85 mmHg  Pulse 103  Temp(Src) 97.6 F (36.4 C) (Oral)  Resp 20  Ht 5\' 2"  (1.575 m)  Wt 200 lb (90.719 kg)  BMI 36.57 kg/m2  SpO2 98%  The patient had no acute events since last update.  Calm and cooperative at this time.  Disposition is pending per Psychiatry/Behavioral Medicine team recommendations.     Darci Currentandolph N Sally Menard, MD 01/18/15 [phone]

## 2015-01-18 NOTE — Progress Notes (Signed)
Recreation Therapy Notes  At approximately 4:02 pm, LRT spoke with nursing regarding patient's assessment. Per nursing, patient had been seeing things and was tearful this afternoon. LRT will attempt assessment on Monday, 05.16.16.   Jacquelynn CreeGreene,Lanna Labella M, LRT/CTRS 01/18/2015 4:35 PM

## 2015-01-18 NOTE — ED Notes (Signed)

## 2015-01-18 NOTE — Progress Notes (Signed)
Recreation Therapy Notes  Date: 05.13.16 Time: 3:00 pm Location: Craft Room  Group Topic: Communication, Problem solving, Teamwork  Goal Area(s) Addresses:  Patient will work in teams towards shared goal. Patient will verbalize skills needed to make activity successful. Patient will verbalize benefit of using skills identified to reach post d/c goals.  Behavioral Response: Did not attend  Intervention: Landing Pad  Activity: Patients were given 12 straws and approximately 2.5 feet of tape and instructed to build a lnading pad to catch a golf ball that was dropped from approximately 4 feet.  Education: LRT educated patient on healthy support systems and why they are important.  Education Outcome: Did not attend  Clinical Observations/Feedback: Did not attend   Jacquelynn CreeGreene,Alvetta Hidrogo M, LRT/CTRS 01/18/2015 4:09 PM

## 2015-01-18 NOTE — Plan of Care (Signed)
Problem: Ineffective individual coping Goal: STG: Patient will remain free from self harm Outcome: Progressing Pt is safe on the unit,

## 2015-01-18 NOTE — Progress Notes (Signed)
Her psychomotor activity is slow.She was crying in her room states "I am scared,I am seeing monsters & alligators".Reassured & comfort measures provided.Stated that the voice at times tell her to hurt herself & others.She contracts for safety.Attended groups.Compliant with meds.Appetite good.

## 2015-01-18 NOTE — ED Notes (Addendum)
BEHAVIORAL HEALTH ROUNDING Patient sleeping: Yes.   Patient alert and oriented: not applicable Behavior appropriate: Yes.    Nutrition and fluids offered: No Toileting and hygiene offered: No Sitter present: q15 minute observations Law enforcement present: Yes [employer] 

## 2015-01-18 NOTE — Progress Notes (Signed)
Call from [name] [phone] with Alexandria Harvey Start. Provided with [facility] social worker phone number Alexandria Harvey.  Alexandria Harvey Alexandria Harvey. Alexandria MajorsLCSWA, MSW Clinical Social Work Department Emergency Room [phone] 4:25 PM

## 2015-01-18 NOTE — H&P (Signed)
Psychiatric Admission Assessment Adult  Patient Identification: Alexandria Harvey MRN:  [id number] Date of Evaluation:  01/18/2015 Chief Complaint:  schizophrenia Principal Diagnosis: Schizoaffective disorder, depressive type Diagnosis:   Patient Active Problem List   Diagnosis Date Noted  . Schizoaffective disorder, depressive type [F25.1] 01/18/2015  . Type 2 diabetes mellitus without complication [Q00.8]   . MR (mental retardation), moderate [F71]   . ALLERGIC RHINITIS [J30.9] 12/19/2009  . DYSPEPSIA [K30] 09/26/2009  . CONSTIPATION [K59.00] 09/26/2009  . TINEA PEDIS [B35.3] 12/17/2008  . HYPERLIPIDEMIA [E78.5] 01/08/2007  . Essential hypertension [I10] 01/08/2007  . Seizure disorder [G40.909] 01/08/2007   History of Present Illness: "I've been hearing things and seeing things".  Alexandria Harvey is a 34 y.o. female with a history that includes mental retardationand schizophrenia vs schizoaffective disorder. Patient is under the guardianship of [facility].  Patient presents to our ER with several days of auditory or visual hallucinations. She reported that the auditory hallucinations are telling her to kill herself, though she does not want to.   Per emergency room psychiatry's patient presented without any commitment papers. Patient reported that she's been hearing things and seeing things. Seeing monsters and hearing voices that tell her to hurt herself.  She described her mood as sad, and as stated that her energy was low and she was not sleeping well.  Patient denied any recent stressors other than she was going to be moving into a new living situation within the next couple of days. She said she was getting along well at the group home and said that she was compliant with medication.  Patient was in our emergency department earlier this week with similar complaints. Just today she reported that the hallucinations were worse and that she was thinking about hurting herself.    Today the patient was interviewed. She reports that she has been crying and feels sad because she is seeing alligators, wolfs and snakes which frightens her.  Patient states she is having thoughts about hurting herself and others and that she hears voices that tell her to go ahead and do it. As far as side effects from medications she complains of tremors and feeling like her heart is pounding. Denies any physical complaints today.  Some paranoia was noted during the assessment as patient says that somebody was changing her medications and putting Zyprexa with her current medications. She complains that Zyprexa makes her see things.  Collateral information was obtained from patient's guardian phone number [phone]. She reports that the patient has been on 4 different group homes since November 2015. She was released from [facility] in November 2015. She was just discharged from [facility] 2 weeks ago. Patient is not currently set up to follow up with any specific agency as she has been in and out of hospitals. Patient has been accepted [facility], [facility] in [place] for number [phone]. Guardian states that she has completed an application for [facility] for long-term treatment. This patient has a care coordinator for number [phone]. It is unclear as to what led the patient to be discharged from her most recent group home. These group home's telephone number is [phone]. However per records K she has a history of aggression.   Substance abuse history : Denies substance abuse.   HPI Elements: Quality: Auditory and visual hallucinations with commands to harm herself. Severity: Moderate to severe. Timing: Getting worse over the last couple days. Duration: Intermittent. Context: Living situation  is about to change .  Total Time spent with patient: 1 hour   Past psychiatric history: Past history positive for multiple  psychiatric hospitalizations. Recently discharged from [facility]. Has been seen: Behavioral health as well. Developmental disability apparently and schizophrenia or schizoaffective disorder. Has been responsive to medication the past.  Per guardian she was at [facility] in November 2015 since then she's been placing 4 different group homes. She was just discharged from [facility]  2 weeks ago after a one month stay.  Past Medical History:  Medical history positive for diabetes hypertension seizures. No acute medical complaints Past Medical History  Diagnosis Date  . Mild mental retardation   . Excessive anger   . Schizophrenia   . Hallucinations   . Diabetes mellitus without complication   . Hypertension   . Seizures    History reviewed. No pertinent past surgical history.  Family History: Patient's unclear about that. Family History  Problem Relation Age of Onset  . Diabetes Mellitus II Mother   . Cancer Father    Social History: Social history as the patient just recently was discharged from an inpatient hospital. Currently he had a new group home. She says her guardian was getting ready to place her in another new place soon. Patient is under the guardianship of [facility]. History  Alcohol Use No     History  Drug Use No    History   Social History  . Marital Status: Single    Spouse Name: N/A  . Number of Children: N/A  . Years of Education: N/A   Social History Main Topics  . Smoking status: Never Smoker   . Smokeless tobacco: Not on file  . Alcohol Use: No  . Drug Use: No  . Sexual Activity: No   Other Topics Concern  . None   Social History Narrative    Musculoskeletal: Strength & Muscle Tone: within normal limits Gait & Station: normal Patient leans: N/A  Psychiatric Specialty Exam: Physical Exam  Review of Systems  Constitutional: Negative.   HENT: Negative.   Eyes: Negative.   Respiratory: Negative.   Cardiovascular: Negative.    Gastrointestinal: Negative.   Genitourinary: Negative.   Musculoskeletal: Negative.   Skin: Negative.   Neurological: Positive for tremors.  Psychiatric/Behavioral: Positive for depression, suicidal ideas and hallucinations. Negative for substance abuse. The patient is nervous/anxious and has insomnia.     Blood pressure 131/85, pulse 106, temperature 98.3 F (36.8 C), temperature source Oral, resp. rate 20, last menstrual period 09/19/2014.There is no weight on file to calculate BMI.  General Appearance: Fairly Groomed  Engineer, water::  Good  Speech:  Slow and Low volume  Volume:  Decreased  Mood:  Anxious  Affect:  Blunt  Thought Process:  Concrete  Orientation:  Full (Time, Place, and Person)  Thought Content:  Hallucinations: Auditory Command:  Voices tell her to kill herself and others Visual  Suicidal Thoughts:  Yes.  without intent/plan  Homicidal Thoughts:  No  Memory:  Immediate;   Fair Recent;   Fair Remote;   Fair  Judgement:  Poor  Insight:  Lacking  Psychomotor Activity:  Decreased  Concentration:  Fair  Recall:  NA  Fund of Knowledge:Poor  Language: Fair  Akathisia:  No  Handed:    AIMS (if indicated):     Assets:  Catering manager Housing Physical Health Social Support  ADL's:  Intact  Cognition: WNL  Sleep:  Number of Hours: 0.75   Physical  examination per our emergency department M.D.: Constitutional: Alert and oriented. Well appearing and in no acute distress. Eyes: Conjunctivae are normal. PERRL. EOMI. Head: Atraumatic. Nose: No congestion/rhinnorhea. Mouth/Throat: Mucous membranes are moist. Oropharynx non-erythematous. Neck: No stridor.  Cardiovascular: Normal rate, regular rhythm. Grossly normal heart sounds. Good peripheral circulation. Respiratory: Normal respiratory effort. No retractions. Lungs CTAB. Gastrointestinal: Soft and nontender. No distention. No abdominal bruits. No CVA tenderness. Musculoskeletal: No lower  extremity tenderness nor edema. No joint effusions. Neurologic: Normal speech and language. No gross focal neurologic deficits are appreciated. Speech is normal. No gait instability. Skin: Skin is warm, dry and intact. No rash noted. Psychiatric: Mood and affect are depressed. Mild cognitive impairment.  Risk to Self: Is patient at risk for suicide?: No Risk to Others:   Prior Inpatient Therapy:   Prior Outpatient Therapy:    Alcohol Screening: 1. How often do you have a drink containing alcohol?: Never 9. Have you or someone else been injured as a result of your drinking?: No 10. Has a relative or friend or a doctor or another health worker been concerned about your drinking or suggested you cut down?: No Alcohol Use Disorder Identification Test Final Score (AUDIT): 0 Brief Intervention: AUDIT score less than 7 or less-screening does not suggest unhealthy drinking-brief intervention not indicated  Allergies:   Allergies  Allergen Reactions  . Haloperidol Lactate Other (See Comments)    unk  . Shrimp [Shellfish Allergy] Hives and Rash   Lab Results:  Results for orders placed or performed during the hospital encounter of 01/18/15 (from the past 48 hour(s))  Lipid panel     Status: Abnormal   Collection Time: 01/18/15  6:57 AM  Result Value Ref Range   Cholesterol 200 0 - 200 mg/dL   Triglycerides 135 <150 mg/dL   HDL 25 (L) >40 mg/dL   Total CHOL/HDL Ratio 8.0 RATIO   VLDL 27 0 - 40 mg/dL   LDL Cholesterol 148 (H) 0 - 99 mg/dL    Comment:        Total Cholesterol/HDL:CHD Risk Coronary Heart Disease Risk Table                     Men   Women  1/2 Average Risk   3.4   3.3  Average Risk       5.0   4.4  2 X Average Risk   9.6   7.1  3 X Average Risk  23.4   11.0        Use the calculated Patient Ratio above and the CHD Risk Table to determine the patient's CHD Risk.        ATP III CLASSIFICATION (LDL):  <100     mg/dL   Optimal  100-129  mg/dL   Near or Above                     Optimal  130-159  mg/dL   Borderline  160-189  mg/dL   High  >190     mg/dL   Very High   Valproic acid level     Status: Abnormal   Collection Time: 01/18/15  6:57 AM  Result Value Ref Range   Valproic Acid Lvl 16 (L) 50.0 - 100.0 ug/mL  Glucose, capillary     Status: Abnormal   Collection Time: 01/18/15 12:00 PM  Result Value Ref Range   Glucose-Capillary 197 (H) 65 - 99 mg/dL   Current Medications: Current Facility-Administered Medications  Medication Dose Route Frequency Provider Last Rate Last Dose  . acetaminophen (TYLENOL) tablet 650 mg  650 mg Oral Q6H PRN Gonzella Lex, MD      . alum & mag hydroxide-simeth (MAALOX/MYLANTA) 200-200-20 MG/5ML suspension 30 mL  30 mL Oral Q4H PRN Gonzella Lex, MD      . atenolol (TENORMIN) tablet 50 mg  50 mg Oral Daily Gonzella Lex, MD   50 mg at 01/18/15 1018  . atorvastatin (LIPITOR) tablet 80 mg  80 mg Oral q1800 Gonzella Lex, MD      . bisacodyl (DULCOLAX) EC tablet 5 mg  5 mg Oral Daily PRN Gonzella Lex, MD      . cloZAPine (CLOZARIL) tablet 100 mg  100 mg Oral QHS Gonzella Lex, MD      . divalproex (DEPAKOTE ER) 24 hr tablet 500 mg  500 mg Oral BID Gonzella Lex, MD   500 mg at 01/18/15 2035  . docusate sodium (COLACE) capsule 100 mg  100 mg Oral BID Gonzella Lex, MD   100 mg at 01/18/15 1018  . lisinopril (PRINIVIL,ZESTRIL) tablet 2.5 mg  2.5 mg Oral Daily Gonzella Lex, MD   2.5 mg at 01/18/15 1016  . magnesium hydroxide (MILK OF MAGNESIA) suspension 30 mL  30 mL Oral Daily PRN Gonzella Lex, MD      . metFORMIN (GLUCOPHAGE) tablet 500 mg  500 mg Oral BID WC Gonzella Lex, MD   500 mg at 01/18/15 5974  . pantoprazole (PROTONIX) EC tablet 40 mg  40 mg Oral Daily Gonzella Lex, MD   40 mg at 01/18/15 1016   PTA Medications: Prescriptions prior to admission  Medication Sig Dispense Refill Last Dose  . atenolol (TENORMIN) 50 MG tablet Take 50 mg by mouth daily.   unk  . atorvastatin (LIPITOR) 80 MG tablet  Take 80 mg by mouth at bedtime.   unk  . bisacodyl (DULCOLAX) 5 MG EC tablet Take 10 mg by mouth daily as needed for moderate constipation.   unk  . Blood Glucose Monitoring Suppl (ONE TOUCH ULTRA 2) W/DEVICE KIT Monitor blood sugars at least 2 times daily. 1 each 0 unk  . cloZAPine (CLOZARIL) 100 MG tablet Take 100-200 mg by mouth 2 (two) times daily. Take 142m in the morning and 2060mat bedtime   unk  . divalproex (DEPAKOTE) 500 MG DR tablet Take 500 mg by mouth 3 (three) times daily.    u  . docusate sodium (COLACE) 100 MG capsule Take 100 mg by mouth 2 (two) times daily.   unk  . glucose blood (ONE TOUCH ULTRA TEST) test strip Use as instructed 100 each 12 unk  . insulin aspart (NOVOLOG) 100 UNIT/ML FlexPen Inject into the skin 4 (four) times daily -  before meals and at bedtime. Sliding scale   unk  . ipratropium (ATROVENT) 0.03 % nasal spray Place 1 spray under the tongue 2 (two) times daily.    11/29/2014 at Unknown time  . Lancets (ONETOUCH ULTRASOFT) lancets Use as instructed 100 each 12 unk  . lisinopril (PRINIVIL,ZESTRIL) 2.5 MG tablet Take 2.5 mg by mouth daily.   unk  . medroxyPROGESTERone (DEPO-PROVERA) 150 MG/ML injection Inject 150 mg into the muscle every 3 (three) months.   January 2016  . metFORMIN (GLUCOPHAGE) 500 MG tablet Take 1,000 mg by mouth 2 (two) times daily with a meal.   unk  . omeprazole (PRILOSEC) 20 MG capsule Take  40 mg by mouth daily.   unk  . selenium sulfide (SELSUN) 2.5 % shampoo Apply 1 application topically every Wednesday.   11/28/2014    Previous Psychotropic Medications: Yes   Substance Abuse History in the last 12 months:  No.    Consequences of Substance Abuse: NA  Results for orders placed or performed during the hospital encounter of 01/18/15 (from the past 72 hour(s))  Lipid panel     Status: Abnormal   Collection Time: 01/18/15  6:57 AM  Result Value Ref Range   Cholesterol 200 0 - 200 mg/dL   Triglycerides 135 <150 mg/dL   HDL 25 (L)  >40 mg/dL   Total CHOL/HDL Ratio 8.0 RATIO   VLDL 27 0 - 40 mg/dL   LDL Cholesterol 148 (H) 0 - 99 mg/dL    Comment:        Total Cholesterol/HDL:CHD Risk Coronary Heart Disease Risk Table                     Men   Women  1/2 Average Risk   3.4   3.3  Average Risk       5.0   4.4  2 X Average Risk   9.6   7.1  3 X Average Risk  23.4   11.0        Use the calculated Patient Ratio above and the CHD Risk Table to determine the patient's CHD Risk.        ATP III CLASSIFICATION (LDL):  <100     mg/dL   Optimal  100-129  mg/dL   Near or Above                    Optimal  130-159  mg/dL   Borderline  160-189  mg/dL   High  >190     mg/dL   Very High   Valproic acid level     Status: Abnormal   Collection Time: 01/18/15  6:57 AM  Result Value Ref Range   Valproic Acid Lvl 16 (L) 50.0 - 100.0 ug/mL  Glucose, capillary     Status: Abnormal   Collection Time: 01/18/15 12:00 PM  Result Value Ref Range   Glucose-Capillary 197 (H) 65 - 99 mg/dL    Observation Level/Precautions:  15 minute checks  Laboratory:  HbAIC and lipid panel  Psychotherapy:    Medications:    Consultations:    Discharge Concerns:    Estimated LOS:  Other:     Psychological Evaluations: No   Treatment Plan Summary: Daily contact with patient to assess and evaluate symptoms and progress in treatment and Medication management   Schizoaffective disorder depressed type -Continue Clozaril but I will increase the dose to 125 mg by mouth daily at bedtime .  I will also start her on 25 mg every morning.  Hypertension: Continue atenolol 50 mg by mouth twice a day and lisinopril 2.5 mg by mouth daily  Dyslipidemia: Continue Lipitor 80 mg by mouth daily at bedtime  Constipation: Continue Colace 100 mg by mouth twice a day and Dulcolax 10 mg by mouth daily when necessary  Seizures: Continue Depakote  DR 500 mg tid.  Depakote level drawn on May 12 was 16  Diabetes: Continue metformin 500 mg twice a day and  NovoLog sliding scale insulin  GERD: Continue Protonix 40 mg by mouth daily  Contraception: Continue Depo-Provera  Labs we'll order hemoglobin A1c.  Lipid panel was checked on May her HDL  is 25 and her LDL 138  Precautions we will order fall and seizure precautions  This assessment took longer than 90 minutes. More than 50% of the time was spent in coordination of care. Reviewing records and speaking with guardian.  Medical Decision Making:  Established Problem, Worsening (2)  I certify that inpatient services furnished can reasonably be expected to improve the patient's condition.   Hildred Priest 5/13/20161:43 PM

## 2015-01-18 NOTE — [facility] Group Notes (Signed)
[facility] Group Notes:  (Nursing/MHT/Case Management/Adjunct)  Date:  01/18/2015  Time:  12:23 PM  Type of Therapy:  Psychoeducational Skills  Participation Level:  Active  Participation Quality:  Appropriate and Sharing  Affect:  Appropriate  Cognitive:  Appropriate  Insight:  Appropriate  Engagement in Group:  Engaged  Modes of Intervention:  Discussion and Education  Summary of Progress/Problems:  Lynelle SmokeCara Travis [facility] Center[facility]oni 01/18/2015, 12:23 PM

## 2015-01-18 NOTE — Progress Notes (Signed)
Call from patient's guardian Elizbeth SquiresRoxanne Brand [phone] from [facility] [facility] to inquire about disposition for patient.  ED-CSW forward contact information to [facility]-CSW.  Sammuel Hineseborah Jamont Mellin. Theresia MajorsLCSWA, MSW Clinical Social Work Department Emergency Room [phone] 10:23 AM

## 2015-01-18 NOTE — ED Notes (Signed)
BEHAVIORAL HEALTH ROUNDING Patient sleeping: No. Patient alert and oriented: yes Behavior appropriate: Yes.  ; If no, describe:  Nutrition and fluids offered: Yes  Toileting and hygiene offered: Yes  Sitter present: yes Law enforcement present: Yes  

## 2015-01-18 NOTE — Progress Notes (Signed)
MEDICATION RELATED CONSULT NOTE - INITIAL   Pharmacy Consult for clozapine      Allergies  Allergen Reactions  . Haloperidol Lactate Other (See Comments)    unk  . Shrimp [Shellfish Allergy] Hives and Rash     Vital Signs: Temp: 97.6 F (36.4 C) (05/12 1915) Temp Source: Oral (05/12 1915) BP: 126/85 mmHg (05/12 1915) Pulse Rate: 103 (05/12 1915) I    Labs:  Recent Labs  01/16/15 2120  WBC 13.6*  HGB 13.1  HCT 42.0  PLT 181  CREATININE 0.73  ALBUMIN 3.9  PROT 8.0  AST 23  ALT 16  ALKPHOS 67  BILITOT 0.6   Estimated Creatinine Clearance: 105.7 mL/min (by C-G formula based on Cr of 0.73).  [place] [address]     Medical History: Past Medical History  Diagnosis Date  . Mild mental retardation   . Excessive anger   . Schizophrenia   . Hallucinations   . Diabetes mellitus without complication   . Hypertension   . Seizures     Medications:  Prescriptions prior to admission  Medication Sig Dispense Refill Last Dose  . atenolol (TENORMIN) 50 MG tablet Take 50 mg by mouth daily.   unk  . atorvastatin (LIPITOR) 80 MG tablet Take 80 mg by mouth at bedtime.   unk  . bisacodyl (DULCOLAX) 5 MG EC tablet Take 10 mg by mouth daily as needed for moderate constipation.   unk  . Blood Glucose Monitoring Suppl (ONE TOUCH ULTRA 2) W/DEVICE KIT Monitor blood sugars at least 2 times daily. 1 each 0 unk  . cloZAPine (CLOZARIL) 100 MG tablet Take 100-200 mg by mouth 2 (two) times daily. Take 173m in the morning and 2031mat bedtime   unk  . divalproex (DEPAKOTE) 500 MG DR tablet Take 500 mg by mouth 3 (three) times daily.    u  . docusate sodium (COLACE) 100 MG capsule Take 100 mg by mouth 2 (two) times daily.   unk  . glucose blood (ONE TOUCH ULTRA TEST) test strip Use as instructed 100 each 12 unk  . insulin aspart (NOVOLOG) 100 UNIT/ML FlexPen Inject into the skin 4 (four) times daily -  before meals and at bedtime. Sliding scale   unk  . ipratropium (ATROVENT) 0.03 % nasal spray  Place 1 spray under the tongue 2 (two) times daily.    11/29/2014 at Unknown time  . Lancets (ONETOUCH ULTRASOFT) lancets Use as instructed 100 each 12 unk  . lisinopril (PRINIVIL,ZESTRIL) 2.5 MG tablet Take 2.5 mg by mouth daily.   unk  . medroxyPROGESTERone (DEPO-PROVERA) 150 MG/ML injection Inject 150 mg into the muscle every 3 (three) months.   January 2016  . metFORMIN (GLUCOPHAGE) 500 MG tablet Take 1,000 mg by mouth 2 (two) times daily with a meal.   unk  . omeprazole (PRILOSEC) 20 MG capsule Take 40 mg by mouth daily.   unk  . selenium sulfide (SELSUN) 2.5 % shampoo Apply 1 application topically every Wednesday.   11/28/2014   Scheduled:  . atenolol  50 mg Oral Daily  . atorvastatin  80 mg Oral q1800  . cloZAPine  100 mg Oral QHS  . divalproex  500 mg Oral BID  . docusate sodium  100 mg Oral BID  . lisinopril  2.5 mg Oral Daily  . metFORMIN  500 mg Oral BID WC  . pantoprazole  40 mg Oral Daily    Assessment: ANC is acceptable for dispensing clozapine.   Plan:  Next  labs ordered for 5/15.  Ulice Dash D 01/18/2015,3:41 AM

## 2015-01-18 NOTE — [facility] Suicide Risk Assessment (Signed)
[facility]   Nursing information obtained from:    Demographic factors:    Current Mental Status:    Loss Factors:    Historical Factors:    Risk Reduction Factors:    Total Time spent with patient: 45 minutes Principal Problem: Schizoaffective disorder, depressive type Diagnosis:   Patient Active Problem List   Diagnosis Date Noted  . Schizoaffective disorder, depressive type [F25.1] 01/18/2015  . Type 2 diabetes mellitus without complication [E11.9]   . MR (mental retardation), moderate [F71]   . ALLERGIC RHINITIS [J30.9] 12/19/2009  . DYSPEPSIA [K30] 09/26/2009  . CONSTIPATION [K59.00] 09/26/2009  . TINEA PEDIS [B35.3] 12/17/2008  . HYPERLIPIDEMIA [E78.5] 01/08/2007  . Essential hypertension [I10] 01/08/2007  . Seizure disorder [G40.909] 01/08/2007     Continued Clinical Symptoms:  Alcohol Use Disorder Identification Test Final Score (AUDIT): 0 The "Alcohol Use Disorders Identification Test", Guidelines for Use in Primary Care, Second Edition.  World Science writerHealth Organization [facility](WHO). Score between 0-7:  no or low risk or alcohol related problems. Score between 8-15:  moderate risk of alcohol related problems. Score between 16-19:  high risk of alcohol related problems. Score 20 or above:  warrants further diagnostic evaluation for alcohol dependence and treatment.   CLINICAL FACTORS:   Depression:   Insomnia Schizophrenia:   Command hallucinatons Epilepsy More than one psychiatric diagnosis Currently Psychotic Previous Psychiatric Diagnoses and Treatments   Musculoskeletal: Strength & Muscle Tone: within normal limits Gait & Station: normal Patient leans: N/A  Psychiatric Specialty Exam: Physical Exam  ROS  Blood pressure 131/85, pulse 106, temperature 98.3 F (36.8 C), temperature source Oral, resp. rate 20, last menstrual period 09/19/2014.There is no weight on file to calculate BMI.                                                          COGNITIVE FEATURES THAT CONTRIBUTE TO RISK:  Closed-mindedness    SUICIDE RISK:   Moderate:  Frequent suicidal ideation with limited intensity, and duration, some specificity in terms of plans, no associated intent, good self-control, limited dysphoria/symptomatology, some risk factors present, and identifiable protective factors, including available and accessible social support.  [facility]  Medical Decision Making:  Established Problem, Worsening (2)  I certify that inpatient services furnished can reasonably be expected to improve the patient's condition.   Jimmy FootmanHernandez-Gonzalez,  Alexandria Harvey 01/18/2015, 2:57 PM

## 2015-01-18 NOTE — ED Notes (Signed)
BEHAVIORAL HEALTH ROUNDING Patient sleeping: Yes.   Patient alert and oriented: not applicable Behavior appropriate: Yes.  ; If no, describe:  Nutrition and fluids offered: No. Pt. Is sleeping. Toileting and hygiene offered: No Sitter present: yes Law enforcement present: Yes
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
## 2015-01-18 NOTE — Tx Team (Signed)
Initial Interdisciplinary Treatment Plan   PATIENT STRESSORS: Financial difficulties Health problems Traumatic event   PATIENT STRENGTHS: General fund of knowledge  Motivation for treatment.   PROBLEM LIST: Problem List/Patient Goals Date to be addressed Date deferred Reason deferred Estimated date of resolution  Risk for suicide   01/18/15     Cognitively delayed 01/18/15           Housing; find a new group home.  01/18/15     Depression.  01/18/15     AVH hallucination.  01/18/15                        DISCHARGE CRITERIA:  Improved stabilization in mood, thinking, and/or behavior Safe-care adequate arrangements made Verbal commitment to aftercare and medication compliance  PRELIMINARY DISCHARGE PLAN: Attend aftercare/continuing care group Attend PHP/IOP  PATIENT/FAMIILY INVOLVEMENT: This treatment plan has been presented to and reviewed with the patient, Alexandria Harvey.  The patient and family have been given the opportunity to ask questions and make suggestions.  [name] [name] 01/18/2015, 4:32 AM

## 2015-01-19 LAB — GLUCOSE, CAPILLARY
GLUCOSE-CAPILLARY: 128 mg/dL — AB (ref 65–99)
Glucose-Capillary: 160 mg/dL — ABNORMAL HIGH (ref 65–99)
Glucose-Capillary: 191 mg/dL — ABNORMAL HIGH (ref 65–99)

## 2015-01-19 NOTE — Progress Notes (Signed)
D: Patient denies SI/HI/AVH. Patient affect is flat. Mood is depressed.  Patient did attend evening group. Patient visible on the milieu. No distress noted. A: Support and encouragement offered. Scheduled medications given to pt. Q 15 min checks continued for patient safety. R: Patient receptive. Patient remains safe on the unit.   

## 2015-01-19 NOTE — Progress Notes (Signed)
Pt. Has isolated to her room, in bed.  Continues to appear depressed with flat affect.  Appetite good.  Taking prescribed meds.  Does not interact with peers.  Has not attended groups.

## 2015-01-19 NOTE — Progress Notes (Signed)
[facility] MD Progress Note  01/19/2015 3:09 PM Alexandria Harvey  MRN:  [id number]  Subjective: Alexandria Harvey reports much improvement today. She no longer hears voices. Her visions are gone. She however appears to attend to internal stimuli. She slept well, appetite is fair. She accepts and tolerates medications well. She did participate in groups today. Her energy is good. She has no somatic complaints .   Principal Problem: Schizoaffective disorder, depressive type Diagnosis:   Patient Active Problem List   Diagnosis Date Noted  . Schizoaffective disorder, depressive type [F25.1] 01/18/2015  . Type 2 diabetes mellitus without complication [E11.9]   . MR (mental retardation), moderate [F71]   . ALLERGIC RHINITIS [J30.9] 12/19/2009  . DYSPEPSIA [K30] 09/26/2009  . CONSTIPATION [K59.00] 09/26/2009  . TINEA PEDIS [B35.3] 12/17/2008  . HYPERLIPIDEMIA [E78.5] 01/08/2007  . Essential hypertension [I10] 01/08/2007  . Seizure disorder [G40.909] 01/08/2007   Total Time spent with patient: 20 minutes   Past Medical History:  Past Medical History  Diagnosis Date  . Mild mental retardation   . Excessive anger   . Schizophrenia   . Hallucinations   . Diabetes mellitus without complication   . Hypertension   . Seizures    History reviewed. No pertinent past surgical history. Family History:  Family History  Problem Relation Age of Onset  . Diabetes Mellitus II Mother   . Cancer Father    Social History:  History  Alcohol Use No     History  Drug Use No    History   Social History  . Marital Status: Single    Spouse Name: N/A  . Number of Children: N/A  . Years of Education: N/A   Social History Main Topics  . Smoking status: Never Smoker   . Smokeless tobacco: Not on file  . Alcohol Use: No  . Drug Use: No  . Sexual Activity: No   Other Topics Concern  . None   Social History Narrative   Additional History:    Sleep: Good  Appetite:  Fair   Assessment:    Musculoskeletal: Strength & Muscle Tone: within normal limits Gait & Station: normal Patient leans: N/A   Psychiatric Specialty Exam: Physical Exam  Nursing note and vitals reviewed.   Review of Systems  All other systems reviewed and are negative.   Blood pressure 116/80, pulse 101, temperature 98 F (36.7 C), temperature source Oral, resp. rate 20, last menstrual period 09/19/2014, SpO2 100 %.There is no weight on file to calculate BMI.  General Appearance: Casual  Eye Contact::  Fair  Speech:  Normal Rate  Volume:  Normal  Mood:  Depressed  Affect:  Flat  Thought Process:  slow  Orientation:  Full (Time, Place, and Person)  Thought Content:  Hallucinations: Auditory Visual  Suicidal Thoughts:  No  Homicidal Thoughts:  No  Memory:  Immediate;   Fair Recent;   Fair Remote;   Fair  Judgement:  Fair  Insight:  Fair  Psychomotor Activity:  Normal and Decreased  Concentration:  Fair  Recall:  [employer] of Knowledge:Fair  Language: Fair  Akathisia:  No  Handed:  Right  AIMS (if indicated):     Assets:  Desire for Improvement Financial Resources/Insurance Housing  ADL's:  Intact  Cognition: WNL  Sleep:  Number of Hours: 7.25     Current Medications: Current Facility-Administered Medications  Medication Dose Route Frequency Provider Last Rate Last Dose  . acetaminophen (TYLENOL) tablet 650 mg  650 mg Oral Q6H  PRN Audery AmelJohn T Clapacs, MD      . alum & mag hydroxide-simeth (MAALOX/MYLANTA) 200-200-20 MG/5ML suspension 30 mL  30 mL Oral Q4H PRN Audery AmelJohn T Clapacs, MD      . atenolol (TENORMIN) tablet 50 mg  50 mg Oral Daily Jimmy FootmanAndrea Hernandez-Gonzalez, MD   50 mg at 01/19/15 0915  . atorvastatin (LIPITOR) tablet 80 mg  80 mg Oral QHS Jimmy FootmanAndrea Hernandez-Gonzalez, MD   80 mg at 01/18/15 2103  . bisacodyl (DULCOLAX) EC tablet 10 mg  10 mg Oral Daily PRN Jimmy FootmanAndrea Hernandez-Gonzalez, MD      . cloZAPine (CLOZARIL) tablet 125 mg  125 mg Oral QHS Jimmy FootmanAndrea Hernandez-Gonzalez, MD   125 mg  at 01/18/15 2104  . cloZAPine (CLOZARIL) tablet 25 mg  25 mg Oral Daily Jimmy FootmanAndrea Hernandez-Gonzalez, MD   25 mg at 01/19/15 0919  . divalproex (DEPAKOTE) DR tablet 500 mg  500 mg Oral TID Jimmy FootmanAndrea Hernandez-Gonzalez, MD   500 mg at 01/19/15 0914  . docusate sodium (COLACE) capsule 100 mg  100 mg Oral BID Audery AmelJohn T Clapacs, MD   100 mg at 01/19/15 0915  . ipratropium (ATROVENT) 0.03 % nasal spray 1 spray  1 spray Nasal BID PRN Jimmy FootmanAndrea Hernandez-Gonzalez, MD      . lisinopril (PRINIVIL,ZESTRIL) tablet 2.5 mg  2.5 mg Oral Daily Jimmy FootmanAndrea Hernandez-Gonzalez, MD   2.5 mg at 01/19/15 0914  . LORazepam (ATIVAN) tablet 2 mg  2 mg Oral Q6H PRN Jimmy FootmanAndrea Hernandez-Gonzalez, MD      . magnesium hydroxide (MILK OF MAGNESIA) suspension 30 mL  30 mL Oral Daily PRN Audery AmelJohn T Clapacs, MD      . metFORMIN (GLUCOPHAGE) tablet 1,000 mg  1,000 mg Oral BID WC Jimmy FootmanAndrea Hernandez-Gonzalez, MD   1,000 mg at 01/19/15 0802  . pantoprazole (PROTONIX) EC tablet 40 mg  40 mg Oral Daily Jimmy FootmanAndrea Hernandez-Gonzalez, MD   40 mg at 01/19/15 0915  . selenium sulfide (SELSUN) 2.5 % shampoo 1 application  1 application Topical Daily PRN Jimmy FootmanAndrea Hernandez-Gonzalez, MD        Lab Results:  Results for orders placed or performed during the hospital encounter of 01/18/15 (from the past 48 hour(s))  Lipid panel     Status: Abnormal   Collection Time: 01/18/15  6:57 AM  Result Value Ref Range   Cholesterol 200 0 - 200 mg/dL   Triglycerides 865135 <784<150 mg/dL   HDL 25 (L) >69>40 mg/dL   Total CHOL/HDL Ratio 8.0 RATIO   VLDL 27 0 - 40 mg/dL   LDL Cholesterol 629148 (H) 0 - 99 mg/dL    Comment:        Total Cholesterol/HDL:CHD Risk Coronary Heart Disease Risk Table                     Men   Women  1/2 Average Risk   3.4   3.3  Average Risk       5.0   4.4  2 X Average Risk   9.6   7.1  3 X Average Risk  23.4   11.0        Use the calculated Patient Ratio above and the CHD Risk Table to determine the patient's CHD Risk.        ATP III CLASSIFICATION  (LDL):  <100     mg/dL   Optimal  [id number]  mg/dL   Near or Above  Optimal  130-159  mg/dL   Borderline  [id number]  mg/dL   High  >914>190     mg/dL   Very High   Valproic acid level     Status: Abnormal   Collection Time: 01/18/15  6:57 AM  Result Value Ref Range   Valproic Acid Lvl 16 (L) 50.0 - 100.0 ug/mL  Glucose, capillary     Status: Abnormal   Collection Time: 01/18/15 12:00 PM  Result Value Ref Range   Glucose-Capillary 197 (H) 65 - 99 mg/dL  Glucose, capillary     Status: Abnormal   Collection Time: 01/18/15  4:35 PM  Result Value Ref Range   Glucose-Capillary 127 (H) 65 - 99 mg/dL   Comment 1 Notify RN   Glucose, capillary     Status: Abnormal   Collection Time: 01/19/15  6:52 AM  Result Value Ref Range   Glucose-Capillary 160 (H) 65 - 99 mg/dL   Comment 1 Notify RN   Glucose, capillary     Status: Abnormal   Collection Time: 01/19/15 11:46 AM  Result Value Ref Range   Glucose-Capillary 191 (H) 65 - 99 mg/dL   Comment 1 Notify RN     Physical Findings: AIMS:  , ,  ,  ,    CIWA:    COWS:     Treatment Plan Summary: Daily contact with patient to assess and evaluate symptoms and progress in treatment and Medication management   Medical Decision Making:  Review of Psycho-Social Stressors (1), Established Problem, Worsening (2), Review of Medication Regimen & Side Effects (2) and Review of New Medication or Change in Dosage (2)   [name] history of schizoaffective disorder admitted for worsening psychosis, auditory and visual hallucinations.   Continue Clozaril but I will increase the dose to 125 mg by mouth daily at bedtime . I will also start her on 25 mg every morning.  Hypertension: Continue atenolol 50 mg by mouth twice a day and lisinopril 2.5 mg by mouth daily  Dyslipidemia: Continue Lipitor 80 mg by mouth daily at bedtime  Constipation: Continue Colace 100 mg by mouth twice a day and Dulcolax 10 mg by mouth daily when  necessary  Seizures: Continue Depakote DR 500 mg tid. Depakote level drawn on May 12 was 16  Diabetes: Continue metformin 500 mg twice a day and NovoLog sliding scale insulin  GERD: Continue Protonix 40 mg by mouth daily  Contraception: Continue Depo-Provera  Labs we'll order hemoglobin A1c. Lipid panel was checked on May her HDL is 25 and her LDL 138  Precautions we will order fall and seizure precautions  Disposition. The patient tells me that she has been accepted by a new group home as she was at the respite. Marland Kitchen.      Kristine LineaUCILOWSKA, Swayze Kozuch 01/19/2015, 3:09 PM

## 2015-01-19 NOTE — Plan of Care (Signed)
Problem: Alteration in mood Goal: STG-Patient reports thoughts of self-harm to staff Outcome: Progressing Pt. Able to verbalize that she continues to have self-harm thoughts, but will not act upon them.

## 2015-01-19 NOTE — Plan of Care (Signed)
Problem: Alteration in thought process Goal: STG-Patient is able to follow short directions Outcome: Progressing Pt. Is able to following directions; cooperative.

## 2015-01-19 NOTE — [facility] Group Notes (Signed)
[facility] Group Notes:  (Nursing/MHT/Case Management/Adjunct)  Date:  01/19/2015  Time:  1:06 PM  Type of Therapy:  Group Therapy  Participation Level:  Did Not Attend    Marquette Oldmanda Lea Campbell 01/19/2015, 1:06 PM

## 2015-01-19 NOTE — [facility] Group Notes (Signed)
[facility] LCSW Group Therapy  01/19/2015 2:33 PM  Type of Therapy:  Group Therapy  Participation Level:  Did Not Attend  Participation Quality:  n/a  Affect:  n/a  Cognitive:  n/a  Insight:  n/a  Engagement in Therapy:  n/a  Modes of Intervention:  n/a  Summary of Progress/Problems: Pt did not attend group.  Beryl MeagerIngle, Rayvon Dakin T 01/19/2015, 2:33 PM

## 2015-01-19 NOTE — [facility] Group Notes (Signed)
[facility] Group Notes:  (Nursing/MHT/Case Management/Adjunct)  Date:  01/19/2015  Time:  9:26 PM  Type of Therapy:  Group Therapy  Participation Level:  Active  Participation Quality:  Attentive  Affect:  Appropriate  Cognitive:  Appropriate  Insight:  Appropriate  Engagement in Group:  Engaged  Modes of Intervention:  Discussion  Summary of Progress/Problems:  Lynard Postlewait Joy Braidyn Scorsone 01/19/2015, 9:26 PM

## 2015-01-19 NOTE — Progress Notes (Signed)
Pt. Rates her depression as a 4, anxiety as a 4.   Denies A/V hallucinations. Denies HI, but states she is having suicidal thoughts, no plan.  Pt. Is guarded and isolative.

## 2015-01-20 LAB — GLUCOSE, CAPILLARY
GLUCOSE-CAPILLARY: 148 mg/dL — AB (ref 65–99)
Glucose-Capillary: 166 mg/dL — ABNORMAL HIGH (ref 65–99)
Glucose-Capillary: 143 mg/dL — ABNORMAL HIGH (ref 65–99)
Glucose-Capillary: 173 mg/dL — ABNORMAL HIGH (ref 65–99)
Glucose-Capillary: 123 mg/dL — ABNORMAL HIGH (ref 65–99)

## 2015-01-20 LAB — CBC WITH DIFFERENTIAL/PLATELET
BASOS PCT: 1 %
Basophils Absolute: 0.1 10*3/uL (ref 0–0.1)
EOS ABS: 0.4 10*3/uL (ref 0–0.7)
Eosinophils Relative: 3 %
HCT: 36.6 % (ref 35.0–47.0)
Hemoglobin: 11.6 g/dL — ABNORMAL LOW (ref 12.0–16.0)
LYMPHS ABS: 4.4 10*3/uL — AB (ref 1.0–3.6)
LYMPHS PCT: 39 %
MCH: 26.9 pg (ref 26.0–34.0)
MCHC: 31.6 g/dL — ABNORMAL LOW (ref 32.0–36.0)
MCV: 85 fL (ref 80.0–100.0)
MONO ABS: 0.5 10*3/uL (ref 0.2–0.9)
MONOS PCT: 5 %
NEUTROS ABS: 6.1 10*3/uL (ref 1.4–6.5)
Neutrophils Relative %: 52 %
PLATELETS: 165 10*3/uL (ref 150–440)
RBC: 4.31 MIL/uL (ref 3.80–5.20)
RDW: 17.3 % — ABNORMAL HIGH (ref 11.5–14.5)
WBC: 11.5 10*3/uL — AB (ref 3.6–11.0)

## 2015-01-20 NOTE — [facility] Group Notes (Signed)
[facility] LCSW Group Therapy  01/20/2015 2:34 PM  Type of Therapy:  Group Therapy  Participation Level:  Active  Participation Quality:  Appropriate  Affect:  Lethargic  Cognitive:  Confused  Insight:  Improving  Engagement in Therapy:  Improving  Modes of Intervention:  Discussion,education  Summary of Progress/Problems::LCSW introduced group rules and gave suicide intervention and prevention handouts to patients. LCSW asked each patient to openly review if/ what they would do if the became suicidal. Patient was supported by LCSW and peer discussion ensued. This patient identified she would tell her doctor and also tell her brothers. This patient struggled at first and seemed withdrawn. After a short while patient was listening to others when they were discussing grudges with a lot of laughter on the "crazy things" they did while angry. She herself told her peers that when she got upset at the sergent, she pooped on the floor.She was able to say she learned this was not OK behavior then and has learned to go for a walk instead of being bad or hitting.    [name], Alexandria Harvey M 01/20/2015, 2:34 PM

## 2015-01-20 NOTE — B[facility] Counselor (Signed)
Adult Comprehensive Assessment  Patient ID: Alexandria Harvey, female   DOB: 1980/12/11, 34 y.o.   MRN: [id number]  Information Source: Information source: Patient  Current Stressors:  Physical health (include injuries & life threatening diseases): Hearing voices and seeing things really scared me, it was telling me to harm myself and others  Living/Environment/Situation:  Living Arrangements: Group Home Living conditions (as described by patient or guardian): [facility] How long has patient lived in current situation?: just moved there recently What is atmosphere in current home: Comfortable  Family History:  Marital status: Single Does patient have children?: No  Childhood History:  By whom was/is the patient raised?: Both parents Additional childhood history information: Patient reported she is adopted Description of patient's relationship with caregiver when they were a child: stressful at times we got along pretty good but Im not allowed to stay overnight anymore Patient's description of current relationship with people who raised him/her: OK Does patient have siblings?: Yes Number of Siblings: 5 Description of patient's current relationship with siblings: Good- they are pranksters Did patient suffer any verbal/emotional/physical/sexual abuse as a child?: No Did patient suffer from severe childhood neglect?: No Has patient ever been sexually abused/assaulted/raped as an adolescent or adult?: No Was the patient ever a victim of a crime or a disaster?: No Witnessed domestic violence?: No Has patient been effected by domestic violence as an adult?: No  Education:  Highest grade of school patient has completed: 7th grade Currently a student?: No Name of school: [employer] I want to go back Learning disability?: Yes What learning problems does patient have?: unknown  Employment/Work Situation:   Employment situation: On disability Why is patient on  disability: mental illness How long has patient been on disability: good while Patient's job has been impacted by current illness: No What is the longest time patient has a held a job?: na Where was the patient employed at that time?: na Has patient ever been in the Eli Lilly and Companymilitary?: No Has patient ever served in combat?: No  Financial Resources:   Financial resources: Insurance claims handlereceives SSDI Does patient have a Lawyerrepresentative payee or guardian?: Yes Social worker([name] ) Name of representative payee or guardian: Lobbyistoxanne Brand  Alcohol/Substance Abuse:   What has been your use of drugs/alcohol within the last 12 months?: none If attempted suicide, did drugs/alcohol play a role in this?: No Alcohol/Substance Abuse Treatment Hx: Denies past history Has alcohol/substance abuse ever caused legal problems?: No  Social Support System:   Patient's Community Support System: Good Describe Community Support System: She lives in a group home and has a guardian Type of faith/religion: Baptist How does patient's faith help to cope with current illness?: no  Leisure/Recreation:   Leisure and Hobbies: Marketing executivebasket ball, baseball, softball, play video games and watch TV   Strengths/Needs:   What things does the patient do well?: playing sports, do my chores, nice to people In what areas does patient struggle / problems for patient: not to hit people when I am angry  Discharge Plan:   Does patient have access to transportation?: Yes Will patient be returning to same living situation after discharge?: Yes Currently receiving community mental health services: Yes (From Whom) If no, would patient like referral for services when discharged?: No Does patient have financial barriers related to discharge medications?: No  Summary/Recommendations:   Summary and Recommendations (to be completed by the evaluator): Patient is a 34 year old caucasion female. She reports she came to ER because the voices were  telling her to hurt  herself or others. She stated she is not hearing or seeing things right now. She is agreeable to return to group home and take medications as her doctor orderes. Even though pt has guardian LCSW explained and allowed patient to sign consents for her Mother, guardian and  health care provider.. Patient is agreeable not to act out and attend groups whille she is here. She stated she will not hit anybody either. Alexandria Stivers LCSW HenryBandi, [place] JerseyClaudine M. 01/20/2015

## 2015-01-20 NOTE — Progress Notes (Signed)
MEDICATION RELATED CONSULT NOTE - INITIAL   Pharmacy Consult for clozapine      Allergies  Allergen Reactions  . Haloperidol Lactate Other (See Comments)    unk  . Shrimp [Shellfish Allergy] Hives and Rash     Vital Signs: Temp: 98.9 F (37.2 C) (05/15 0500) Temp Source: Oral (05/15 0500) BP: 109/75 mmHg (05/15 0500) Pulse Rate: 81 (05/15 0500) I Total I/O In: 600 [P.O.:600] Out: -   Labs:  Recent Labs  01/20/15 0635  WBC 11.5*  HGB 11.6*  HCT 36.6  PLT 165   Estimated Creatinine Clearance: 105.7 mL/min (by C-G formula based on Cr of 0.73).  [place] [address]   Medical History: Past Medical History  Diagnosis Date  . Mild mental retardation   . Excessive anger   . Schizophrenia   . Hallucinations   . Diabetes mellitus without complication   . Hypertension   . Seizures     Medications:  Prescriptions prior to admission  Medication Sig Dispense Refill Last Dose  . atenolol (TENORMIN) 50 MG tablet Take 50 mg by mouth daily.   unk  . atorvastatin (LIPITOR) 80 MG tablet Take 80 mg by mouth at bedtime.   unk  . bisacodyl (DULCOLAX) 5 MG EC tablet Take 10 mg by mouth daily as needed for moderate constipation.   unk  . Blood Glucose Monitoring Suppl (ONE TOUCH ULTRA 2) W/DEVICE KIT Monitor blood sugars at least 2 times daily. 1 each 0 unk  . cloZAPine (CLOZARIL) 100 MG tablet Take 100-200 mg by mouth 2 (two) times daily. Take 127m in the morning and 2073mat bedtime   unk  . divalproex (DEPAKOTE) 500 MG DR tablet Take 500 mg by mouth 3 (three) times daily.    u  . docusate sodium (COLACE) 100 MG capsule Take 100 mg by mouth 2 (two) times daily.   unk  . glucose blood (ONE TOUCH ULTRA TEST) test strip Use as instructed 100 each 12 unk  . insulin aspart (NOVOLOG) 100 UNIT/ML FlexPen Inject into the skin 4 (four) times daily -  before meals and at bedtime. Sliding scale   unk  . ipratropium (ATROVENT) 0.03 % nasal spray Place 1 spray under the tongue 2 (two) times daily.     11/29/2014 at Unknown time  . Lancets (ONETOUCH ULTRASOFT) lancets Use as instructed 100 each 12 unk  . lisinopril (PRINIVIL,ZESTRIL) 2.5 MG tablet Take 2.5 mg by mouth daily.   unk  . medroxyPROGESTERone (DEPO-PROVERA) 150 MG/ML injection Inject 150 mg into the muscle every 3 (three) months.   January 2016  . metFORMIN (GLUCOPHAGE) 500 MG tablet Take 1,000 mg by mouth 2 (two) times daily with a meal.   unk  . omeprazole (PRILOSEC) 20 MG capsule Take 40 mg by mouth daily.   unk  . selenium sulfide (SELSUN) 2.5 % shampoo Apply 1 application topically every Wednesday.   11/28/2014   Scheduled:  . atenolol  50 mg Oral Daily  . atorvastatin  80 mg Oral QHS  . cloZAPine  125 mg Oral QHS  . cloZAPine  25 mg Oral Daily  . divalproex  500 mg Oral TID  . docusate sodium  100 mg Oral BID  . lisinopril  2.5 mg Oral Daily  . metFORMIN  1,000 mg Oral BID WC  . pantoprazole  40 mg Oral Daily    Assessment: ANC is acceptable for dispensing clozapine, submitted to registry  Plan:  Next labs ordered for 5/22.  Kendelle Schweers C  01/20/2015,3:32 PM

## 2015-01-20 NOTE — Progress Notes (Signed)
[facility] MD Progress Note  01/20/2015 11:44 AM [name]  MRN:  [id number]  Subjective: Alexandria Harvey reports further improvement. She slept well and feels rested in the morning. She denies feeling sedated from Clozaril. Auditory hallucinations and visions disappeared but the patient appears to attend to internal stimuli during our conversation. She has no somatic complaints. She has been going to groups. Her grooming is marginal. There are no behavioral problems.  Principal Problem: Schizoaffective disorder, depressive type Diagnosis:   Patient Active Problem List   Diagnosis Date Noted  . Schizoaffective disorder, depressive type [F25.1] 01/18/2015  . Type 2 diabetes mellitus without complication [E11.9]   . MR (mental retardation), moderate [F71]   . ALLERGIC RHINITIS [J30.9] 12/19/2009  . DYSPEPSIA [K30] 09/26/2009  . CONSTIPATION [K59.00] 09/26/2009  . TINEA PEDIS [B35.3] 12/17/2008  . HYPERLIPIDEMIA [E78.5] 01/08/2007  . Essential hypertension [I10] 01/08/2007  . Seizure disorder [G40.909] 01/08/2007   Total Time spent with patient: 20 minutes   Past Medical History:  Past Medical History  Diagnosis Date  . Mild mental retardation   . Excessive anger   . Schizophrenia   . Hallucinations   . Diabetes mellitus without complication   . Hypertension   . Seizures    History reviewed. No pertinent past surgical history. Family History:  Family History  Problem Relation Age of Onset  . Diabetes Mellitus II Mother   . Cancer Father    Social History:  History  Alcohol Use No     History  Drug Use No    History   Social History  . Marital Status: Single    Spouse Name: N/A  . Number of Children: N/A  . Years of Education: N/A   Social History Main Topics  . Smoking status: Never Smoker   . Smokeless tobacco: Not on file  . Alcohol Use: No  . Drug Use: No  . Sexual Activity: No   Other Topics Concern  . None   Social History Narrative   Additional  History:    Sleep: Good  Appetite:  Good   Assessment:   Musculoskeletal: Strength & Muscle Tone: within normal limits Gait & Station: normal Patient leans: N/A   Psychiatric Specialty Exam: Physical Exam  Nursing note and vitals reviewed.   Review of Systems  All other systems reviewed and are negative.   Blood pressure 109/75, pulse 81, temperature 98.9 F (37.2 C), temperature source Oral, resp. rate 20, last menstrual period 09/19/2014, SpO2 100 %.There is no weight on file to calculate BMI.  General Appearance: Disheveled  Eye Contact::  Poor  Speech:  Slow  Volume:  Decreased  Mood:  Depressed  Affect:  Flat  Thought Process:  influenced by hallucinations.  Orientation:  Full (Time, Place, and Person)  Thought Content:  Hallucinations: Auditory Visual  Suicidal Thoughts:  No  Homicidal Thoughts:  No  Memory:  Immediate;   Fair Recent;   Fair Remote;   Fair  Judgement:  Impaired  Insight:  Lacking  Psychomotor Activity:  Decreased  Concentration:  Fair  Recall:  [employer] of Knowledge:Fair  Language: Fair  Akathisia:  No  Handed:  Right  AIMS (if indicated):     Assets:  Desire for Improvement Financial Resources/Insurance Housing  ADL's:  Intact  Cognition: WNL  Sleep:  Number of Hours: 7.45     Current Medications: Current Facility-Administered Medications  Medication Dose Route Frequency Provider Last Rate Last Dose  . acetaminophen (TYLENOL) tablet 650 mg  650 mg Oral Q6H PRN Audery AmelJohn T Clapacs, MD      . alum & mag hydroxide-simeth (MAALOX/MYLANTA) 200-200-20 MG/5ML suspension 30 mL  30 mL Oral Q4H PRN Audery AmelJohn T Clapacs, MD      . atenolol (TENORMIN) tablet 50 mg  50 mg Oral Daily Jimmy FootmanAndrea Hernandez-Gonzalez, MD   50 mg at 01/20/15 0958  . atorvastatin (LIPITOR) tablet 80 mg  80 mg Oral QHS Jimmy FootmanAndrea Hernandez-Gonzalez, MD   80 mg at 01/19/15 2122  . bisacodyl (DULCOLAX) EC tablet 10 mg  10 mg Oral Daily PRN Jimmy FootmanAndrea Hernandez-Gonzalez, MD      .  cloZAPine (CLOZARIL) tablet 125 mg  125 mg Oral QHS Jimmy FootmanAndrea Hernandez-Gonzalez, MD   125 mg at 01/19/15 2123  . cloZAPine (CLOZARIL) tablet 25 mg  25 mg Oral Daily Jimmy FootmanAndrea Hernandez-Gonzalez, MD   25 mg at 01/20/15 0957  . divalproex (DEPAKOTE) DR tablet 500 mg  500 mg Oral TID Jimmy FootmanAndrea Hernandez-Gonzalez, MD   500 mg at 01/20/15 0956  . docusate sodium (COLACE) capsule 100 mg  100 mg Oral BID Audery AmelJohn T Clapacs, MD   100 mg at 01/20/15 0957  . ipratropium (ATROVENT) 0.03 % nasal spray 1 spray  1 spray Nasal BID PRN Jimmy FootmanAndrea Hernandez-Gonzalez, MD      . lisinopril (PRINIVIL,ZESTRIL) tablet 2.5 mg  2.5 mg Oral Daily Jimmy FootmanAndrea Hernandez-Gonzalez, MD   2.5 mg at 01/20/15 0957  . LORazepam (ATIVAN) tablet 2 mg  2 mg Oral Q6H PRN Jimmy FootmanAndrea Hernandez-Gonzalez, MD      . magnesium hydroxide (MILK OF MAGNESIA) suspension 30 mL  30 mL Oral Daily PRN Audery AmelJohn T Clapacs, MD      . metFORMIN (GLUCOPHAGE) tablet 1,000 mg  1,000 mg Oral BID WC Jimmy FootmanAndrea Hernandez-Gonzalez, MD   1,000 mg at 01/20/15 0804  . pantoprazole (PROTONIX) EC tablet 40 mg  40 mg Oral Daily Jimmy FootmanAndrea Hernandez-Gonzalez, MD   40 mg at 01/20/15 0958  . selenium sulfide (SELSUN) 2.5 % shampoo 1 application  1 application Topical Daily PRN Jimmy FootmanAndrea Hernandez-Gonzalez, MD        Lab Results:  Results for orders placed or performed during the hospital encounter of 01/18/15 (from the past 48 hour(s))  Glucose, capillary     Status: Abnormal   Collection Time: 01/18/15 12:00 PM  Result Value Ref Range   Glucose-Capillary 197 (H) 65 - 99 mg/dL  Glucose, capillary     Status: Abnormal   Collection Time: 01/18/15  4:35 PM  Result Value Ref Range   Glucose-Capillary 127 (H) 65 - 99 mg/dL   Comment 1 Notify RN   Glucose, capillary     Status: Abnormal   Collection Time: 01/19/15  6:52 AM  Result Value Ref Range   Glucose-Capillary 160 (H) 65 - 99 mg/dL   Comment 1 Notify RN   Glucose, capillary     Status: Abnormal   Collection Time: 01/19/15 11:46 AM  Result Value  Ref Range   Glucose-Capillary 191 (H) 65 - 99 mg/dL   Comment 1 Notify RN   Glucose, capillary     Status: Abnormal   Collection Time: 01/19/15  4:37 PM  Result Value Ref Range   Glucose-Capillary 128 (H) 65 - 99 mg/dL  Glucose, capillary     Status: Abnormal   Collection Time: 01/19/15  8:42 PM  Result Value Ref Range   Glucose-Capillary 166 (H) 65 - 99 mg/dL   Comment 1 Notify RN    Comment 2 Document in Chart   CBC  with Differential/Platelet     Status: Abnormal   Collection Time: 01/20/15  6:35 AM  Result Value Ref Range   WBC 11.5 (H) 3.6 - 11.0 K/uL   RBC 4.31 3.80 - 5.20 MIL/uL   Hemoglobin 11.6 (L) 12.0 - 16.0 g/dL   HCT 16.136.6 09.635.0 - 04.547.0 %   MCV 85.0 80.0 - 100.0 fL   MCH 26.9 26.0 - 34.0 pg   MCHC 31.6 (L) 32.0 - 36.0 g/dL   RDW 40.917.3 (H) 81.111.5 - 91.414.5 %   Platelets 165 150 - 440 K/uL   Neutrophils Relative % 52 %   Neutro Abs 6.1 1.4 - 6.5 K/uL   Lymphocytes Relative 39 %   Lymphs Abs 4.4 (H) 1.0 - 3.6 K/uL   Monocytes Relative 5 %   Monocytes Absolute 0.5 0.2 - 0.9 K/uL   Eosinophils Relative 3 %   Eosinophils Absolute 0.4 0 - 0.7 K/uL   Basophils Relative 1 %   Basophils Absolute 0.1 0 - 0.1 K/uL  Glucose, capillary     Status: Abnormal   Collection Time: 01/20/15  7:02 AM  Result Value Ref Range   Glucose-Capillary 143 (H) 65 - 99 mg/dL   Comment 1 Notify RN     Physical Findings: AIMS:  , ,  ,  ,    CIWA:    COWS:     Treatment Plan Summary: Daily contact with patient to assess and evaluate symptoms and progress in treatment and Medication management   Medical Decision Making:  Established Problem, Stable/Improving (1), Review of Psycho-Social Stressors (1), Review or order clinical lab tests (1) and Review of Medication Regimen & Side Effects (2)   [name] has a history of schizoaffective disorder admitted for worsening psychosis, auditory and visual hallucinations.   1. Continue Clozaril 25 mg in am 125 mg by mouth at bedtime .   2.  Hypertension: Continue atenolol 50 mg twice a day and lisinopril 2.5 mg daily  3. Dyslipidemia: Continue Lipitor 80 mg at bedtime  4. Constipation: Continue Colace 100 mg twice a day and Dulcolax 10 mg daily when necessary  5. Seizures: Continue Depakote DR 500 mg tid. Depakote level drawn on May 12 was 16  6. Diabetes: Continue metformin 500 mg twice a day and NovoLog sliding scale insulin  7. GERD: Continue Protonix 40 mg daily  8. Contraception: Continue Depo-Provera  9. Disposition. The patient tells me that she has been accepted by a new group home as she came [facility].   Labs we'll order hemoglobin A1c. Lipid panel was checked on May her HDL is 25 and her LDL 138  Precautions we will order fall and seizure precautions       Mubashir Mallek 01/20/2015, 11:44 AM

## 2015-01-20 NOTE — Tx Team (Signed)
Interdisciplinary Treatment Plan Update (Adult)  Date:  01/20/2015 Time Reviewed:  10:25 AM  Progress in Treatment: Attending groups: Yes. Participating in groups:  Yes. Taking medication as prescribed:  Yes. Tolerating medication:  Yes. Family/Significant othe contact made:  Guardian: Alexandria Harvey Patient understands diagnosis:  Yes. Discussing patient identified problems/goals with staff:  Yes. Medical problems stabilized or resolved:  Yes. Denies suicidal/homicidal ideation: Yes. Issues/concerns per patient self-inventory:  Yes. Other:  New problem(s) identified: Patient wishes to [facility] the [facility] Discharge Plan or Barriers: To group home and her psychiatrist  Reason for Continuation of Hospitalization: patient stated she is hearing and seeing things. She reports voices are telling her to harm herself or others   Comments: Patient has some cognition issues,but is able to answer simple questions, at times she is a poor historian  Estimated length of stay:7 days  New goal(s): To get better and go back to group home  Review of initial/current patient goals per problem list:   See plan of care goals  Attendees: Patient:  Alexandria Harvey 5/15/201610:25 AM  Family:   5/15/201610:25 AM  Physician:   5/15/201610:25 AM  Nursing:    5/15/201610:25 AM  Case Manager:   5/15/201610:25 AM  Counselor:   5/15/201610:25 AM  Other:  Arrie Senatelaudine Joseangel Nettleton LCSW 5/15/201610:25 AM  Other:   5/15/201610:25 AM  Other:   5/15/201610:25 AM  Other:  5/15/201610:25 AM  Other:  5/15/201610:25 AM  Other:  5/15/201610:25 AM  Other:  5/15/201610:25 AM  Other:  5/15/201610:25 AM  Other:  5/15/201610:25 AM  Other:   5/15/201610:25 AM   Scribe for Treatment Team:   Cheron SchaumannBandi, Yehudit Fulginiti M, 01/20/2015, 10:25 AM

## 2015-01-20 NOTE — [facility] Group Notes (Signed)
[facility] Group Notes:  (Nursing/MHT/Case Management/Adjunct)  Date:  01/20/2015  Time:  10:37 AM  Type of Therapy:  Group Therapy  Participation Level:  Did Not Attend   Leonidas RombergRegina Revonda Zaylynn Rickett 01/20/2015, 10:37 AM

## 2015-01-20 NOTE — Progress Notes (Signed)
Patient rates her depression a "7"; denies any SI and contracts for safety; admits to experiencing mild anxiety at times; continues to have auditory hallucinations; has been seclusive to her room; has been calm, quiet, and cooperative throughout the shift; no further voiced complaints; continue to monitor.

## 2015-01-20 NOTE — Progress Notes (Signed)
D) Patient pleasant and cooperative upon my assessment. Patient did not complete Self Inventory assessment,  However, patient denies SI/HI, denies A/V hallucinations at this time. Reports that her appetite is good. Patient's affect is blunted and mood is sad and depressed.   A) Patient offered support and encouragement, patient encouraged to discuss feelings/concerns with staff. Patient verbalized understanding. Patient monitored Q15 minutes for safety. Patient met with MD  to discuss today's goals and plan of care.  R) Patient encouraged to spend time out of her room.  She has , attended groups and meals in dining room. Patient appropriate with staff and peers.   Patient taking medications as ordered. Will continue to monitor.

## 2015-01-20 NOTE — Plan of Care (Signed)
Problem: Alteration in thought process Goal: LTG-Patient behavior demonstrates decreased signs psychosis (Patient behavior demonstrates decreased signs of psychosis to the point the patient is safe to return home and continue treatment in an outpatient setting.)  Outcome: Progressing Patient denies any hallucinations at present.     

## 2015-01-20 NOTE — Plan of Care (Signed)
Problem: Alteration in thought process Goal: LTG-Patient behavior demonstrates decreased signs psychosis (Patient behavior demonstrates decreased signs of psychosis to the point the patient is safe to return home and continue treatment in an outpatient setting.)  Outcome: Not Progressing Patient continues to have auditory hallucinations.

## 2015-01-21 LAB — GLUCOSE, CAPILLARY
GLUCOSE-CAPILLARY: 94 mg/dL (ref 65–99)
Glucose-Capillary: 125 mg/dL — ABNORMAL HIGH (ref 65–99)
Glucose-Capillary: 162 mg/dL — ABNORMAL HIGH (ref 65–99)

## 2015-01-21 MED ORDER — SENNA 8.6 MG PO TABS
2.0000 | ORAL_TABLET | Freq: Every day | ORAL | Status: DC
  Administered 2015-01-21 – 2015-01-24 (×3): 17.2 mg via ORAL
  Filled 2015-01-21 (×3): qty 2

## 2015-01-21 MED ORDER — CLOZAPINE 25 MG PO TABS
50.0000 mg | ORAL_TABLET | Freq: Every day | ORAL | Status: DC
  Administered 2015-01-21 – 2015-01-25 (×5): 50 mg via ORAL
  Filled 2015-01-21 (×5): qty 2

## 2015-01-21 MED ORDER — DOCUSATE SODIUM 100 MG PO CAPS
200.0000 mg | ORAL_CAPSULE | Freq: Two times a day (BID) | ORAL | Status: DC
  Administered 2015-01-21 – 2015-01-25 (×7): 200 mg via ORAL
  Filled 2015-01-21 (×7): qty 2

## 2015-01-21 NOTE — [facility] Group Notes (Signed)
[facility] LCSW Group Therapy  01/21/2015 5:19 PM  Type of Therapy:  Group Therapy  Participation Level:  Active  Participation Quality:  Attentive  Affect:  Flat  Cognitive:  Appropriate  Insight:  Developing/Improving  Engagement in Therapy:  Developing/Improving  Modes of Intervention:  Discussion, Education, Exploration and Problem-solving  Summary of Progress/ProblemsLCSW  Introduced group rules and all patients were asked to be supportive of their peers. LCSW introduced todays topic " Overcoming obstacles" Patients were asked to identify their obstacles as a collective group and then to problem solve in a team work fashion to reduce the obstacles and barriers they each face. LCSW facilitated peer to peer support and made suggestions to assist patients with their obstacles.  This patient was able to identify that getting angry sometimes gets her in trouble. She was able to problemsolve today by thinking of better choices to make, take a walk, go into her room, speak to staff.   Alexandria SenateBandi, [name] 01/21/2015, 5:19 PM

## 2015-01-21 NOTE — [facility] Group Notes (Signed)
Pt attended and participated in goal/discharge planning group. Pt presented flat, reserved, only speaking when asked to participate. Pt verbalizes that her goal is to talk with her doctor about discharge.

## 2015-01-21 NOTE — Progress Notes (Signed)
Recreation Therapy Notes  INPATIENT RECREATION THERAPY ASSESSMENT  Patient Details Name: Alexandria Harvey MRN: [id number] DOB: 12-21-1980 Today's Date: 01/21/2015  Patient Stressors: Other (Comment) (Eat late. Sugar getting high)  Coping Skills:   Isolate, Arguments, Art/Dance, Talking, Music, Sports  Personal Challenges: Anger, Concentration, Decision-Making, Social Interaction, Trusting Others  Leisure Interests (2+):  Sports - Basketball, Individual - Other (Comment) (Football and Armed forces operational officerplaying [employer])  Awareness of Community Resources:  Yes  Community Resources:  [employer], Other (Comment) Advertising account executive(Movie Theater)  Current Use: Yes  If no, Barriers?:    Patient Strengths:  family, play video games  Patient Identified Areas of Improvement:  Expressing anger better  Current Recreation Participation:  Learning math  Patient Goal for Hospitalization:  Report everything is happening to me and stay on my diet  [place]  [place] Residence[place]   Current [place] (including self-harm):  No  Current HI:  No  Consent to Intern Participation: N/A   Jacquelynn CreeGreene,Sahvanna Mcmanigal M, LRT/CTRS 01/21/2015, 4:40 PM

## 2015-01-21 NOTE — Progress Notes (Signed)
Recreation Therapy Notes  Date: 05.16.16 Time: 3:05 pm Location: Craft Room  Group Topic: Self-expression  Goal Area(s) Addresses:  Patient will effectively use art as a means of self-expression. Patient will recognize positive benefit of self-expression. Patient will be able to identify one emotion experienced during group session. Patient will identify use of art/self-expression as a coping skill.  Behavioral Response: Did not attend  Intervention: Two Faces of Me  Activity: Patients were given a blank face worksheet and instructed to draw a line down the middle. On one side, they were instructed to draw/write how they felt when they were admitted to the hospital. On the other side, they were instructed to draw/write how they want to feel when they are d/c.   Education: LRT educated patients on different forms of self-expression  Education Outcome: Patient did not attend group.  Clinical Observations/Feedback: Patient did not attend group.   Jacquelynn CreeGreene,Gladis Soley M, LRT/CTRS 01/21/2015 4:09 PM

## 2015-01-21 NOTE — Progress Notes (Signed)
Patient rates her depression a "7"; denies any SI and contracts for safety; admits to having mild anxiety at times; denies anymore auditory hallucinations; remains seclusive to her room; has been calm, quiet, and cooperative throughout the shift; no voiced complaints; continue to monitor.

## 2015-01-21 NOTE — Progress Notes (Signed)
[facility] MD Progress Note  01/21/2015 11:03 AM Alexandria Harvey  MRN:  [id number]  Subjective: Patient reports feeling much better. Feels medication is working well for her. Denies having suicidal thoughts, or hearing seeing things since Friday. Denies issues with sleep, appetite, energy, poor concentration. Denies having any side effects from her medications. As far as physical complaints she does report constipation and complains of having some mild abdominal cramping.  Principal Problem: Schizoaffective disorder, depressive type Diagnosis:   Patient Active Problem List   Diagnosis Date Noted  . Schizoaffective disorder, depressive type [F25.1] 01/18/2015  . Type 2 diabetes mellitus without complication [E11.9]   . MR (mental retardation), moderate [F71]   . ALLERGIC RHINITIS [J30.9] 12/19/2009  . DYSPEPSIA [K30] 09/26/2009  . CONSTIPATION [K59.00] 09/26/2009  . TINEA PEDIS [B35.3] 12/17/2008  . HYPERLIPIDEMIA [E78.5] 01/08/2007  . Essential hypertension [I10] 01/08/2007  . Seizure disorder [G40.909] 01/08/2007   Total Time spent with patient: 30 minutes   Past Medical History:  Past Medical History  Diagnosis Date  . Mild mental retardation   . Excessive anger   . Schizophrenia   . Hallucinations   . Diabetes mellitus without complication   . Hypertension   . Seizures    History reviewed. No pertinent past surgical history. Family History:  Family History  Problem Relation Age of Onset  . Diabetes Mellitus II Mother   . Cancer Father    Social History:  History  Alcohol Use No     History  Drug Use No    History   Social History  . Marital Status: Single    Spouse Name: N/A  . Number of Children: N/A  . Years of Education: N/A   Social History Main Topics  . Smoking status: Never Smoker   . Smokeless tobacco: Not on file  . Alcohol Use: No  . Drug Use: No  . Sexual Activity: No   Other Topics Concern  . None   Social History Narrative   Additional  History:    Sleep: Good  Appetite:  Good   Assessment:   Musculoskeletal: Strength & Muscle Tone: within normal limits Gait & Station: normal Patient leans: N/A   Psychiatric Specialty Exam: Physical Exam  Nursing note and vitals reviewed.   Review of Systems  HENT: Negative.   Gastrointestinal: Positive for abdominal pain and constipation. Negative for nausea, vomiting and diarrhea.  Musculoskeletal: Negative.   Psychiatric/Behavioral: Negative for depression, suicidal ideas, hallucinations and substance abuse. The patient is not nervous/anxious and does not have insomnia.     Blood pressure 107/71, pulse 86, temperature 98.4 F (36.9 C), temperature source Oral, resp. rate 20, last menstrual period 09/19/2014, SpO2 100 %.There is no weight on file to calculate BMI.  General Appearance: Fairly Groomed  Patent attorneyye Contact::  Fair  Speech:  Slow  Volume:  Decreased  Mood:  Depressed  Affect:  Flat  Thought Process:  Concrete  Orientation:  Full (Time, Place, and Person)  Thought Content:  Hallucinations: None  Suicidal Thoughts:  No  Homicidal Thoughts:  No  Memory:  Immediate;   Fair Recent;   Fair Remote;   Fair  Judgement:  Impaired  Insight:  Lacking  Psychomotor Activity:  Decreased  Concentration:  Fair  Recall:  FiservFair  Fund of Knowledge:Fair  Language: Fair  Akathisia:  No  Handed:  Right  AIMS (if indicated):     Assets:  Desire for Improvement Financial Resources/Insurance Housing  ADL's:  Intact  Cognition: WNL  Sleep:  Number of Hours: 8.3     Current Medications: Current Facility-Administered Medications  Medication Dose Route Frequency Provider Last Rate Last Dose  . acetaminophen (TYLENOL) tablet 650 mg  650 mg Oral Q6H PRN Audery Amel, MD      . alum & mag hydroxide-simeth (MAALOX/MYLANTA) 200-200-20 MG/5ML suspension 30 mL  30 mL Oral Q4H PRN Audery Amel, MD      . atenolol (TENORMIN) tablet 50 mg  50 mg Oral Daily Jimmy Footman, MD   50 mg at 01/21/15 0912  . atorvastatin (LIPITOR) tablet 80 mg  80 mg Oral QHS Jimmy Footman, MD   80 mg at 01/20/15 2103  . bisacodyl (DULCOLAX) EC tablet 10 mg  10 mg Oral Daily PRN Jimmy Footman, MD      . cloZAPine (CLOZARIL) tablet 125 mg  125 mg Oral QHS Jimmy Footman, MD   125 mg at 01/20/15 2103  . cloZAPine (CLOZARIL) tablet 50 mg  50 mg Oral Daily Jimmy Footman, MD   50 mg at 01/21/15 0914  . divalproex (DEPAKOTE) DR tablet 500 mg  500 mg Oral TID Jimmy Footman, MD   500 mg at 01/21/15 0911  . docusate sodium (COLACE) capsule 100 mg  100 mg Oral BID Audery Amel, MD   100 mg at 01/21/15 0911  . ipratropium (ATROVENT) 0.03 % nasal spray 1 spray  1 spray Nasal BID PRN Jimmy Footman, MD      . lisinopril (PRINIVIL,ZESTRIL) tablet 2.5 mg  2.5 mg Oral Daily Jimmy Footman, MD   2.5 mg at 01/21/15 0911  . LORazepam (ATIVAN) tablet 2 mg  2 mg Oral Q6H PRN Jimmy Footman, MD      . magnesium hydroxide (MILK OF MAGNESIA) suspension 30 mL  30 mL Oral Daily PRN Audery Amel, MD      . metFORMIN (GLUCOPHAGE) tablet 1,000 mg  1,000 mg Oral BID WC Jimmy Footman, MD   1,000 mg at 01/21/15 0809  . pantoprazole (PROTONIX) EC tablet 40 mg  40 mg Oral Daily Jimmy Footman, MD   40 mg at 01/21/15 1610  . selenium sulfide (SELSUN) 2.5 % shampoo 1 application  1 application Topical Daily PRN Jimmy Footman, MD        Lab Results:  Results for orders placed or performed during the hospital encounter of 01/18/15 (from the past 48 hour(s))  Glucose, capillary     Status: Abnormal   Collection Time: 01/19/15 11:46 AM  Result Value Ref Range   Glucose-Capillary 191 (H) 65 - 99 mg/dL   Comment 1 Notify RN   Glucose, capillary     Status: Abnormal   Collection Time: 01/19/15  4:37 PM  Result Value Ref Range   Glucose-Capillary 128 (H) 65 - 99 mg/dL   Glucose, capillary     Status: Abnormal   Collection Time: 01/19/15  8:42 PM  Result Value Ref Range   Glucose-Capillary 166 (H) 65 - 99 mg/dL   Comment 1 Notify RN    Comment 2 Document in Chart   CBC with Differential/Platelet     Status: Abnormal   Collection Time: 01/20/15  6:35 AM  Result Value Ref Range   WBC 11.5 (H) 3.6 - 11.0 K/uL   RBC 4.31 3.80 - 5.20 MIL/uL   Hemoglobin 11.6 (L) 12.0 - 16.0 g/dL   HCT 96.0 45.4 - 09.8 %   MCV 85.0 80.0 - 100.0 fL   MCH 26.9 26.0 - 34.0 pg  MCHC 31.6 (L) 32.0 - 36.0 g/dL   RDW 30.8 (H) 65.7 - 84.6 %   Platelets 165 150 - 440 K/uL   Neutrophils Relative % 52 %   Neutro Abs 6.1 1.4 - 6.5 K/uL   Lymphocytes Relative 39 %   Lymphs Abs 4.4 (H) 1.0 - 3.6 K/uL   Monocytes Relative 5 %   Monocytes Absolute 0.5 0.2 - 0.9 K/uL   Eosinophils Relative 3 %   Eosinophils Absolute 0.4 0 - 0.7 K/uL   Basophils Relative 1 %   Basophils Absolute 0.1 0 - 0.1 K/uL  Glucose, capillary     Status: Abnormal   Collection Time: 01/20/15  7:02 AM  Result Value Ref Range   Glucose-Capillary 143 (H) 65 - 99 mg/dL   Comment 1 Notify RN   Glucose, capillary     Status: Abnormal   Collection Time: 01/20/15 12:02 PM  Result Value Ref Range   Glucose-Capillary 173 (H) 65 - 99 mg/dL  Glucose, capillary     Status: Abnormal   Collection Time: 01/20/15  4:15 PM  Result Value Ref Range   Glucose-Capillary 148 (H) 65 - 99 mg/dL  Glucose, capillary     Status: Abnormal   Collection Time: 01/20/15  8:17 PM  Result Value Ref Range   Glucose-Capillary 123 (H) 65 - 99 mg/dL  Glucose, capillary     Status: Abnormal   Collection Time: 01/21/15  6:59 AM  Result Value Ref Range   Glucose-Capillary 125 (H) 65 - 99 mg/dL    Physical Findings: AIMS:  , ,  ,  ,    CIWA:    COWS:     Treatment Plan Summary: Daily contact with patient to assess and evaluate symptoms and progress in treatment and Medication management   Medical Decision Making:  Established  Problem, Stable/Improving (1), Review of Psycho-Social Stressors (1), Review or order clinical lab tests (1) and Review of Medication Regimen & Side Effects (2)   Alexandria Harvey has a history of schizoaffective disorder admitted for worsening psychosis, auditory and visual hallucinations.   1. Continue Clozaril  today I will increase the dose to 50 mg in the morning and 125 mg by mouth at bedtime .  ANC 6.1 on May 15.  Patient is responding well to the increased dose of Clozaril. Social worker informed me today that she has been discharged from several group homes due to aggression.  I plan to increase their Clozaril between 350 and 400 mg before discharging patient to new group home.  2. Hypertension: Continue atenolol 50 mg twice a day and lisinopril 2.5 mg daily.  Blood pressure and heart rate are within the normal limits  3. Dyslipidemia: Continue Lipitor 80 mg at bedtime  4. Constipation: Continue Colace but I will increase to 200 twice a day and we will add Senokot at bedtime   5. Seizures: Continue Depakote DR 500 mg tid. Depakote level drawn on May 12 was 16  6. Diabetes: Continue metformin 500 mg twice a day and NovoLog sliding scale insulin  7. GERD: Continue Protonix 40 mg daily  8. Contraception: Continue Depo-Provera.  I believe her next dose is due in July  9. Disposition: Patient has been accepted in a new group home as informed by her guardian last Friday  Labs we'll order hemoglobin A1c. Lipid panel was checked on May her HDL is 25 and her LDL 138  Precautions we will order fall and seizure precautions   Hernandez-Gonzalez,  Sue Lush  01/21/2015, 11:03 AM

## 2015-01-21 NOTE — Progress Notes (Signed)
Patient is alert to person and place.  She has a blunted affect with good eye contact.  Medication compliant.  Pt has been out of room for meals. Encouraged to bath. But she stated that she would after she "took a nap".  Will cont to monitor for safety.

## 2015-01-21 NOTE — Progress Notes (Signed)
LCSW called Elizbeth SquiresRoxanne Brand [phone] [facility] -Harvest Darkheodosia Williams- She would like to know when patient will be [facility] ( [facility]D case manager[facility] [phone] She is clear pt can be brought to [facility] in [place]. Called [facility] and left message for Laquitia [phone], Awaiting call back

## 2015-01-22 LAB — GLUCOSE, CAPILLARY
GLUCOSE-CAPILLARY: 119 mg/dL — AB (ref 65–99)
Glucose-Capillary: 123 mg/dL — ABNORMAL HIGH (ref 65–99)
Glucose-Capillary: 130 mg/dL — ABNORMAL HIGH (ref 65–99)
Glucose-Capillary: 129 mg/dL — ABNORMAL HIGH (ref 65–99)
Glucose-Capillary: 110 mg/dL — ABNORMAL HIGH (ref 65–99)

## 2015-01-22 MED ORDER — CLOZAPINE 25 MG PO TABS
150.0000 mg | ORAL_TABLET | Freq: Every day | ORAL | Status: DC
  Administered 2015-01-22: 150 mg via ORAL
  Filled 2015-01-22: qty 2

## 2015-01-22 MED ORDER — IPRATROPIUM BROMIDE 0.03 % NA SOLN
1.0000 | Freq: Two times a day (BID) | NASAL | Status: DC
  Administered 2015-01-22 – 2015-01-25 (×5): 1 via NASAL
  Filled 2015-01-22 (×2): qty 30

## 2015-01-22 NOTE — Plan of Care (Signed)
Problem: [facility] Participation in Recreation Therapeutic Interventions Goal: STG-Patient will identify at least five coping skills for ** STG: Coping Skills - Within 4 treatment sessions, patient will verbalize at least 5 coping skills for anger in each of 2 treatment sessions to increase anger management.  Outcome: Not Progressing Treatment Session 1; Completed 0 out of 2: At approximately 11:25 am, LRT met with patient in patient room. Patient refused treatment session stating she was too tired and requested for LRT to come back tomorrow. LRT will attempt treatment session tomorrow.  Leonette Monarch, LRT/CTRS 05.17.16 11:36 am

## 2015-01-22 NOTE — Plan of Care (Signed)
Problem: Ineffective individual coping Goal: LTG: Patient will report a decrease in negative feelings Outcome: Progressing Able to list coping skills Goal: STG: Patient will remain free from self harm Outcome: Progressing Able to alert staff for bad feelings.

## 2015-01-22 NOTE — Progress Notes (Signed)
D: Patient denies SI/HI/AVH.  Patient affect is flat and depressed. Mood is depressed.  Patient did attend evening group. Patient visible on the milieu. No distress noted. A: Support and encouragement offered. Scheduled medications given to pt. Q 15 min checks continued for patient safety. R: Patient receptive. Patient remains safe on the unit.

## 2015-01-22 NOTE — Progress Notes (Signed)
[facility] MD Progress Note  01/22/2015 9:03 AM Alexandria Harvey  MRN:  [id number]  Subjective: Patient reports feeling much better. Patient describes her mood as "anxious" because she is here in the hospital and she feels bore.  Feels medication is working well for her. Denies having suicidal thoughts, or hearing seeing things since Friday. Denies issues with sleep, appetite, energy, poor concentration. Denies having any side effects from her medications. As far as physical complaints she reported constipation yesterday, today she denies having any abdominal crimes and stay she had a bowel movement last night.   Per nursing: "Patient denies SI/HI/AVH. Patient affect is flat and depressed. Mood is depressed. Patient did attend evening group. Patient visible on the milieu. No distress noted. A: Support and encouragement offered. Scheduled medications given to pt. Q 15 min checks continued for patient safety. R: Patient receptive. Patient remains safe on the unit. "  Principal Problem: Schizoaffective disorder, depressive type Diagnosis:   Patient Active Problem List   Diagnosis Date Noted  . Schizoaffective disorder, depressive type [F25.1] 01/18/2015  . Type 2 diabetes mellitus without complication [E11.9]   . MR (mental retardation), moderate [F71]   . ALLERGIC RHINITIS [J30.9] 12/19/2009  . DYSPEPSIA [K30] 09/26/2009  . CONSTIPATION [K59.00] 09/26/2009  . TINEA PEDIS [B35.3] 12/17/2008  . HYPERLIPIDEMIA [E78.5] 01/08/2007  . Essential hypertension [I10] 01/08/2007  . Seizure disorder [G40.909] 01/08/2007   Total Time spent with patient: 30 minutes   Past Medical History:  Past Medical History  Diagnosis Date  . Mild mental retardation   . Excessive anger   . Schizophrenia   . Hallucinations   . Diabetes mellitus without complication   . Hypertension   . Seizures    History reviewed. No pertinent past surgical history. Family History:  Family History  Problem Relation Age of Onset   . Diabetes Mellitus II Mother   . Cancer Father    Social History:  History  Alcohol Use No     History  Drug Use No    History   Social History  . Marital Status: Single    Spouse Name: N/A  . Number of Children: N/A  . Years of Education: N/A   Social History Main Topics  . Smoking status: Never Smoker   . Smokeless tobacco: Not on file  . Alcohol Use: No  . Drug Use: No  . Sexual Activity: No   Other Topics Concern  . None   Social History Narrative   Additional History:    Sleep: Good  Appetite:  Good   Assessment:   Musculoskeletal: Strength & Muscle Tone: within normal limits Gait & Station: normal Patient leans: N/A   Psychiatric Specialty Exam: Physical Exam  Nursing note and vitals reviewed.   Review of Systems  Constitutional: Negative.   HENT: Negative.   Eyes: Negative.   Respiratory: Negative.   Cardiovascular: Negative.   Gastrointestinal: Negative.   Genitourinary: Negative.   Musculoskeletal: Negative.   Skin: Negative.   Neurological: Negative.   Endo/Heme/Allergies: Negative.   Psychiatric/Behavioral: Negative for depression, suicidal ideas, hallucinations, memory loss and substance abuse. The patient is nervous/anxious. The patient does not have insomnia.     Blood pressure 104/72, pulse 91, temperature 97.5 F (36.4 C), temperature source Oral, resp. rate 20, last menstrual period 09/19/2014, SpO2 100 %.There is no weight on file to calculate BMI.  General Appearance: Fairly Groomed  Patent attorney::  Fair  Speech:  Slow  Volume:  Decreased  Mood:  Depressed  Affect:  Flat  Thought Process:  Concrete  Orientation:  Full (Time, Place, and Person)  Thought Content:  Hallucinations: None  Suicidal Thoughts:  No  Homicidal Thoughts:  No  Memory:  Immediate;   Fair Recent;   Fair Remote;   Fair  Judgement:  Impaired  Insight:  Lacking  Psychomotor Activity:  Decreased  Concentration:  Fair  Recall:  FiservFair  Fund of  Knowledge:Fair  Language: Fair  Akathisia:  No  Handed:  Right  AIMS (if indicated):     Assets:  Desire for Improvement Financial Resources/Insurance Housing  ADL's:  Intact  Cognition: WNL  Sleep:  Number of Hours: 8.25     Current Medications: Current Facility-Administered Medications  Medication Dose Route Frequency Provider Last Rate Last Dose  . acetaminophen (TYLENOL) tablet 650 mg  650 mg Oral Q6H PRN Audery AmelJohn T Clapacs, MD      . alum & mag hydroxide-simeth (MAALOX/MYLANTA) 200-200-20 MG/5ML suspension 30 mL  30 mL Oral Q4H PRN Audery AmelJohn T Clapacs, MD      . atenolol (TENORMIN) tablet 50 mg  50 mg Oral Daily Jimmy FootmanAndrea Hernandez-Gonzalez, MD   50 mg at 01/21/15 0912  . atorvastatin (LIPITOR) tablet 80 mg  80 mg Oral QHS Jimmy FootmanAndrea Hernandez-Gonzalez, MD   80 mg at 01/21/15 2128  . cloZAPine (CLOZARIL) tablet 150 mg  150 mg Oral QHS Jimmy FootmanAndrea Hernandez-Gonzalez, MD      . cloZAPine (CLOZARIL) tablet 50 mg  50 mg Oral Daily Jimmy FootmanAndrea Hernandez-Gonzalez, MD   50 mg at 01/21/15 0914  . divalproex (DEPAKOTE) DR tablet 500 mg  500 mg Oral TID Jimmy FootmanAndrea Hernandez-Gonzalez, MD   500 mg at 01/21/15 2128  . docusate sodium (COLACE) capsule 200 mg  200 mg Oral BID Jimmy FootmanAndrea Hernandez-Gonzalez, MD   200 mg at 01/21/15 2128  . ipratropium (ATROVENT) 0.03 % nasal spray 1 spray  1 spray Nasal BID PRN Jimmy FootmanAndrea Hernandez-Gonzalez, MD      . lisinopril (PRINIVIL,ZESTRIL) tablet 2.5 mg  2.5 mg Oral Daily Jimmy FootmanAndrea Hernandez-Gonzalez, MD   2.5 mg at 01/21/15 0911  . LORazepam (ATIVAN) tablet 2 mg  2 mg Oral Q6H PRN Jimmy FootmanAndrea Hernandez-Gonzalez, MD      . magnesium hydroxide (MILK OF MAGNESIA) suspension 30 mL  30 mL Oral Daily PRN Audery AmelJohn T Clapacs, MD      . metFORMIN (GLUCOPHAGE) tablet 1,000 mg  1,000 mg Oral BID WC Jimmy FootmanAndrea Hernandez-Gonzalez, MD   1,000 mg at 01/22/15 0751  . pantoprazole (PROTONIX) EC tablet 40 mg  40 mg Oral Daily Jimmy FootmanAndrea Hernandez-Gonzalez, MD   40 mg at 01/21/15 [id number]  . selenium sulfide (SELSUN) 2.5 % shampoo 1  application  1 application Topical Daily PRN Jimmy FootmanAndrea Hernandez-Gonzalez, MD      . senna (SENOKOT) tablet 17.2 mg  2 tablet Oral QHS Jimmy FootmanAndrea Hernandez-Gonzalez, MD   17.2 mg at 01/21/15 2128    Lab Results:  Results for orders placed or performed during the hospital encounter of 01/18/15 (from the past 48 hour(s))  Glucose, capillary     Status: Abnormal   Collection Time: 01/20/15 12:02 PM  Result Value Ref Range   Glucose-Capillary 173 (H) 65 - 99 mg/dL  Glucose, capillary     Status: Abnormal   Collection Time: 01/20/15  4:15 PM  Result Value Ref Range   Glucose-Capillary 148 (H) 65 - 99 mg/dL  Glucose, capillary     Status: Abnormal   Collection Time: 01/20/15  8:17 PM  Result Value Ref Range  Glucose-Capillary 123 (H) 65 - 99 mg/dL  Glucose, capillary     Status: Abnormal   Collection Time: 01/21/15  6:59 AM  Result Value Ref Range   Glucose-Capillary 125 (H) 65 - 99 mg/dL  Glucose, capillary     Status: Abnormal   Collection Time: 01/21/15 12:05 PM  Result Value Ref Range   Glucose-Capillary 162 (H) 65 - 99 mg/dL  Glucose, capillary     Status: None   Collection Time: 01/21/15  4:54 PM  Result Value Ref Range   Glucose-Capillary 94 65 - 99 mg/dL  Glucose, capillary     Status: Abnormal   Collection Time: 01/21/15  8:26 PM  Result Value Ref Range   Glucose-Capillary 119 (H) 65 - 99 mg/dL   Comment 1 Notify RN    Comment 2 Document in Chart   Glucose, capillary     Status: Abnormal   Collection Time: 01/22/15  7:30 AM  Result Value Ref Range   Glucose-Capillary 123 (H) 65 - 99 mg/dL   Comment 1 Notify RN     Physical Findings: AIMS:  , ,  ,  ,    CIWA:    COWS:     Treatment Plan Summary: Daily contact with patient to assess and evaluate symptoms and progress in treatment and Medication management   Medical Decision Making:  Established Problem, Stable/Improving (1), Review of Psycho-Social Stressors (1), Review or order clinical lab tests (1) and Review of  Medication Regimen & Side Effects (2)   [name] has a history of schizoaffective disorder admitted for worsening psychosis, auditory and visual hallucinations.   1. Continue Clozaril  today I will increase the dose to 50 mg in the morning and 150 mg by mouth at bedtime .  ANC 6.1 on May 15.  Patient is responding well to the increased dose of Clozaril. Social worker informed me today that she has been discharged from several group homes due to aggression.  I plan to increase their Clozaril between 350 and 400 mg before discharging patient to new group home.  2. Hypertension: Continue atenolol 50 mg twice a day and lisinopril 2.5 mg daily.  Blood pressure and heart rate are within the normal limits  3. Dyslipidemia: Continue Lipitor 80 mg at bedtime  4. Constipation: Continue Colace  200 twice a day and Senokot at bedtime   5. Seizures: Continue Depakote DR 500 mg tid. Depakote level drawn on May 12 was 16.  Will recheck level prior to discharge.  6. Diabetes: Continue metformin 500 mg twice a day and NovoLog sliding scale insulin  7. GERD: Continue Protonix 40 mg daily  8. Contraception: Continue Depo-Provera.  I believe her next dose is due in July  9. Disposition: Patient has been accepted in a new group home as informed by her guardian last Friday  Labs we'll order hemoglobin A1c, Depakote level and CBC with differential and TSH for Friday. Lipid panel was checked on May her HDL is 25 and her LDL 138  Precautions we will order fall and seizure precautions   Jimmy FootmanHernandez-Gonzalez,  Ebony Rickel 01/22/2015, 9:03 AM
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
## 2015-01-22 NOTE — [facility] Group Notes (Signed)
[facility] Group Notes:  (Nursing/MHT/Case Management/Adjunct)  Date:  01/22/2015  Time:  10:58 PM  Type of Therapy:  Group Therapy  Participation Level:  Active  Participation Quality:  Appropriate  Affect:  Appropriate  Cognitive:  Oriented  Insight:  Appropriate  Engagement in Group:  Engaged  Modes of Intervention:  N/A  Summary of Progress/Problems:  Veva Holesshley Imani Tamani Durney 01/22/2015, 10:58 PM

## 2015-01-22 NOTE — [facility] Group Notes (Signed)
[facility] LCSW Group Therapy  01/22/2015 3:16 PM  Type of Therapy:  Group Therapy  Participation Level:  Active  Participation Quality:  Appropriate  Affect:  Appropriate  Cognitive:  Oriented  Insight:  Improving  Engagement in Therapy:  Engaged  Modes of Intervention:  Discussion, Education, Exploration and Problem-solving  Summary of Progress/Problems:LCSW introduced group rules to patients. LCSW introduced the topic " Balance in life" Patients were asked to disclose in a collective group "what their life looks like when its unbalanced. Ideas that were shared, decrease in sleep, increase in sleep, hearing and seeing things not real, anxiety, social isolation,couch potatoes, no motivation,negative attitude.Pt asked what they experience when they are balanced. Ideas shared were, light hearted, organized, medications working, energetic, positive attitude. Patient were asked as a group with plans to assist them in keeping their balance. Ideas shared, eating and sleeping well, keep all medical appointments, take medications, reduce stress, increase physical and recreational activities.  Alexandria Harvey came into group early then left, she stated she was tired.  Alexandria Harvey, Alexandria Harvey M 01/22/2015, 3:16 PM

## 2015-01-22 NOTE — Progress Notes (Signed)
Recreation Therapy Notes  Date: 05.17.16 Time: 3:00 pm Location: Craft Room  Group Topic: Self-esteem   Goal Area(s) Addresses:  Patient will write at least one positive trait about self. Patient will verbalize ways to increase self-esteem  Behavioral Response: Did not attend  Intervention: I Am  Activity: Patients were given a worksheet with the big letter I on it and were instructed to fill it with as many positive traits about themselves as they could.   Education: LRT educated patient on self-esteem and ways to increase self-esteem.  Education Outcome: Patient did not attend group.  Clinical Observations/Feedback: Patient did not attend group.   Jacquelynn CreeGreene,Shemika Robbs M, LRT/CTRS 01/22/2015 3:49 PM

## 2015-01-22 NOTE — [facility] Group Notes (Signed)
[facility] Group Notes:  (Nursing/MHT/Case Management/Adjunct)  Date:  01/22/2015  Time:  11:58 AM  Type of Therapy:  Psychoeducational Skills  Participation Level:  Did Not Attend  Participation Quality:  n/a  Affect:  n/a  Cognitive:  n/a  Insight:  None  Engagement in Group:  n/a  Modes of Intervention:  n/a  Summary of Progress/Problems:  Lynelle SmokeCara Travis Itai Barbian 01/22/2015, 11:58 AM

## 2015-01-23 LAB — GLUCOSE, CAPILLARY
GLUCOSE-CAPILLARY: 120 mg/dL — AB (ref 65–99)
GLUCOSE-CAPILLARY: 89 mg/dL (ref 65–99)
Glucose-Capillary: 180 mg/dL — ABNORMAL HIGH (ref 65–99)
Glucose-Capillary: 84 mg/dL (ref 65–99)

## 2015-01-23 MED ORDER — CLOZAPINE 25 MG PO TABS
175.0000 mg | ORAL_TABLET | Freq: Every day | ORAL | Status: DC
  Administered 2015-01-23: 175 mg via ORAL
  Filled 2015-01-23: qty 1
  Filled 2015-01-23: qty 3

## 2015-01-23 NOTE — Progress Notes (Signed)
[facility]  01/23/2015 8:59 AM Alexandria Harvey  MRN:  [id number]  Subjective: Patient reports feeling ok today but last night she is started to have hallucinations and was seeing shadows that make her feel the scare. So far today she denies having any hallucinations. Patient describes her mood as "anxious".  Denies having suicidal thoughts, or homicidal ideation.  Denies issues with sleep, appetite, energy, poor concentration. Denies having any side effects from her medications. As far as physical complaints she reported having some diarrhea yesterday, but not today, she does have some mild abdominal cramps which she thinks is caused by drinking prune juice.   Per nursing: D: Patient denies SI/HI/. Patient crying endorsing visual hallucinations of monsters. Patient affect is and mood are anxious and depressed Patient did attend evening group. Patient visible on the milieu. No distress noted. A: Support and encouragement offered. Scheduled medications given to pt. Q 15 min checks continued for patient safety. R: Patient receptive. Patient remains safe on the unit.   Principal Problem: Schizoaffective disorder, depressive type Diagnosis:   Patient Active Problem List   Diagnosis Date Noted  . Schizoaffective disorder, depressive type [F25.1] 01/18/2015  . Type 2 diabetes mellitus without complication [E11.9]   . MR (mental retardation), moderate [F71]   . ALLERGIC RHINITIS [J30.9] 12/19/2009  . DYSPEPSIA [K30] 09/26/2009  . CONSTIPATION [K59.00] 09/26/2009  . TINEA PEDIS [B35.3] 12/17/2008  . HYPERLIPIDEMIA [E78.5] 01/08/2007  . Essential hypertension [I10] 01/08/2007  . Seizure disorder [G40.909] 01/08/2007   Total Time spent with patient: 30 minutes   Past Medical History:  Past Medical History  Diagnosis Date  . Mild mental retardation   . Excessive anger   . Schizophrenia   . Hallucinations   . Diabetes mellitus without complication   . Hypertension   . Seizures     History reviewed. No pertinent past surgical history. Family History:  Family History  Problem Relation Age of Onset  . Diabetes Mellitus II Mother   . Cancer Father    Social History:  History  Alcohol Use No     History  Drug Use No    History   Social History  . Marital Status: Single    Spouse Name: N/A  . Number of Children: N/A  . Years of Education: N/A   Social History Main Topics  . Smoking status: Never Smoker   . Smokeless tobacco: Not on file  . Alcohol Use: No  . Drug Use: No  . Sexual Activity: No   Other Topics Concern  . None   Social History Narrative   Additional History:    Sleep: Good  Appetite:  Good   Assessment:   Musculoskeletal: Strength & Muscle Tone: within normal limits Gait & Station: normal Patient leans: N/A   Psychiatric Specialty Exam: Physical Exam  Nursing note and vitals reviewed.   Review of Systems  Constitutional: Negative.   HENT: Negative.   Eyes: Negative.   Respiratory: Negative.   Cardiovascular: Negative.   Gastrointestinal: Positive for abdominal pain.  Genitourinary: Negative.   Musculoskeletal: Negative.   Neurological: Negative.   Psychiatric/Behavioral: Positive for depression. Negative for suicidal ideas, hallucinations and substance abuse. The patient is not nervous/anxious and does not have insomnia.     Blood pressure 130/68, pulse 81, temperature 98.4 F (36.9 C), temperature source Oral, resp. rate 20, last menstrual period 09/19/2014, SpO2 100 %.There is no weight on file to calculate BMI.  General Appearance: Fairly Groomed  Patent attorneyye Contact::  Fair  Speech:  Slow  Volume:  Decreased  Mood:  Depressed  Affect:  Flat  Thought Process:  Concrete  Orientation:  Full (Time, Place, and Person)  Thought Content:  Hallucinations: None  Suicidal Thoughts:  No  Homicidal Thoughts:  No  Memory:  Immediate;   Fair Recent;   Fair Remote;   Fair  Judgement:  Impaired  Insight:  Lacking   Psychomotor Activity:  Decreased  Concentration:  Fair  Recall:  [employer] of Knowledge:Fair  Language: Fair  Akathisia:  No  Handed:  Right  AIMS (if indicated):     Assets:  Desire for Improvement Financial Resources/Insurance Housing  ADL's:  Intact  Cognition: WNL  Sleep:  Number of Hours: 8.5     Current Medications: Current Facility-Administered Medications  Medication Dose Route Frequency Provider Last Rate Last Dose  . acetaminophen (TYLENOL) tablet 650 mg  650 mg Oral Q6H PRN Audery Amel, MD      . alum & mag hydroxide-simeth (MAALOX/MYLANTA) 200-200-20 MG/5ML suspension 30 mL  30 mL Oral Q4H PRN Audery Amel, MD      . atenolol (TENORMIN) tablet 50 mg  50 mg Oral Daily Jimmy Footman, MD   50 mg at 01/22/15 0941  . atorvastatin (LIPITOR) tablet 80 mg  80 mg Oral QHS Jimmy Footman, MD   80 mg at 01/22/15 2124  . cloZAPine (CLOZARIL) tablet 150 mg  150 mg Oral QHS Jimmy Footman, MD   150 mg at 01/22/15 2124  . cloZAPine (CLOZARIL) tablet 50 mg  50 mg Oral Daily Jimmy Footman, MD   50 mg at 01/22/15 0939  . divalproex (DEPAKOTE) DR tablet 500 mg  500 mg Oral TID Jimmy Footman, MD   500 mg at 01/22/15 2124  . docusate sodium (COLACE) capsule 200 mg  200 mg Oral BID Jimmy Footman, MD   200 mg at 01/22/15 0941  . ipratropium (ATROVENT) 0.03 % nasal spray 1 spray  1 spray Nasal BID Jimmy Footman, MD   1 spray at 01/22/15 1450  . lisinopril (PRINIVIL,ZESTRIL) tablet 2.5 mg  2.5 mg Oral Daily Jimmy Footman, MD   2.5 mg at 01/22/15 1610  . LORazepam (ATIVAN) tablet 2 mg  2 mg Oral Q6H PRN Jimmy Footman, MD      . magnesium hydroxide (MILK OF MAGNESIA) suspension 30 mL  30 mL Oral Daily PRN Audery Amel, MD      . metFORMIN (GLUCOPHAGE) tablet 1,000 mg  1,000 mg Oral BID WC Jimmy Footman, MD   1,000 mg at 01/23/15 0752  . pantoprazole (PROTONIX) EC tablet 40  mg  40 mg Oral Daily Jimmy Footman, MD   40 mg at 01/22/15 0939  . selenium sulfide (SELSUN) 2.5 % shampoo 1 application  1 application Topical Daily PRN Jimmy Footman, MD      . senna (SENOKOT) tablet 17.2 mg  2 tablet Oral QHS Jimmy Footman, MD   17.2 mg at 01/21/15 2128    Lab Results:  Results for orders placed or performed during the hospital encounter of 01/18/15 (from the past 48 hour(s))  Glucose, capillary     Status: Abnormal   Collection Time: 01/21/15 12:05 PM  Result Value Ref Range   Glucose-Capillary 162 (H) 65 - 99 mg/dL  Glucose, capillary     Status: None   Collection Time: 01/21/15  4:54 PM  Result Value Ref Range   Glucose-Capillary 94 65 - 99 mg/dL  Glucose, capillary  Status: Abnormal   Collection Time: 01/21/15  8:26 PM  Result Value Ref Range   Glucose-Capillary 119 (H) 65 - 99 mg/dL   Comment 1 Notify RN    Comment 2 Document in Chart   Glucose, capillary     Status: Abnormal   Collection Time: 01/22/15  7:30 AM  Result Value Ref Range   Glucose-Capillary 123 (H) 65 - 99 mg/dL   Comment 1 Notify RN   Glucose, capillary     Status: Abnormal   Collection Time: 01/22/15 11:40 AM  Result Value Ref Range   Glucose-Capillary 130 (H) 65 - 99 mg/dL   Comment 1 Notify RN   Glucose, capillary     Status: Abnormal   Collection Time: 01/22/15  4:14 PM  Result Value Ref Range   Glucose-Capillary 129 (H) 65 - 99 mg/dL  Glucose, capillary     Status: Abnormal   Collection Time: 01/22/15  9:00 PM  Result Value Ref Range   Glucose-Capillary 110 (H) 65 - 99 mg/dL   Comment 1 Notify RN   Glucose, capillary     Status: Abnormal   Collection Time: 01/23/15  7:52 AM  Result Value Ref Range   Glucose-Capillary 120 (H) 65 - 99 mg/dL    Physical Findings: AIMS:  , ,  ,  ,    CIWA:    COWS:     Treatment Plan Summary: Daily contact with patient to assess and evaluate symptoms and progress in treatment and Medication  management   Medical Decision Making:  Established Problem, Stable/Improving (1), Review of Psycho-Social Stressors (1), Review or order clinical lab tests (1) and Review of Medication Regimen & Side Effects (2)   Alexandria Harvey has a history of schizoaffective disorder admitted for worsening psychosis, auditory and visual hallucinations.   1. Continue Clozaril  today I will increase the dose to 50 mg in the morning and 175 mg by mouth at bedtime .  ANC 6.1 on May 15.  Patient is responding well to the increased dose of Clozaril. Social worker informed me today that she has been discharged from several group homes due to aggression.  I plan to increase their Clozaril between 350 and 400 mg before discharging patient to new group home.  2. Hypertension: Continue atenolol 50 mg twice a day and lisinopril 2.5 mg daily.  Blood pressure and heart rate are within the normal limits  3. Dyslipidemia: Continue Lipitor 80 mg at bedtime  4. Constipation: Continue Colace  200 twice a day and Senokot at bedtime   5. Seizures: Continue Depakote DR 500 mg tid. Depakote level drawn on May 12 was 16.  Will recheck level prior to discharge.  6. Diabetes: Continue metformin 500 mg twice a day and NovoLog sliding scale insulin  7. GERD: Continue Protonix 40 mg daily  8. Contraception: Continue Depo-Provera.  I believe her next dose is due in July  9. Disposition: Patient has been accepted in a new group home as informed by her guardian last Friday  Labs we'll order hemoglobin A1c, Depakote level and CBC with differential and TSH for Friday. Lipid panel was checked on May her HDL is 25 and her LDL 138  Precautions we will order fall and seizure precautions   Jimmy FootmanHernandez-Gonzalez,  Cy Bresee 01/23/2015, 8:59 AM

## 2015-01-23 NOTE — Plan of Care (Signed)
Problem: [facility] Participation in Recreation Therapeutic Interventions Goal: STG-Patient will identify at least five coping skills for ** STG: Coping Skills - Within 4 treatment sessions, patient will verbalize at least 5 coping skills for anger in each of 2 treatment sessions to increase anger management.  Outcome: Progressing Treatment Session 2; Completed 1 out of 2: At approximately 11:15 am, LRT met with patient in patient room. Patient verbalized 5 coping skills for anger. Patient verbalized what triggers her to get angry, how her body responds to anger, and how she will remember to use her healthy coping skills. LRT provided suggestions as well.  Leonette Monarch, LRT/CTRS 05.18.16 1:24 pm

## 2015-01-23 NOTE — Plan of Care (Signed)
Problem: [facility] Patient Education Outcome: Progressing Continue to educate on medications , but needs re-instruction

## 2015-01-23 NOTE — [facility] Group Notes (Signed)
[facility] Group Notes:  (Nursing/MHT/Case Management/Adjunct)  Date:  01/23/2015  Time:  12:10 PM  Type of Therapy:  Psychoeducational Skills  Participation Level:  Did Not Attend  Participation Quality:  n/a  Affect:  n/a  Cognitive:  n/a  Insight:  None  Engagement in Group:  n/a  Modes of Intervention:  n/a  Summary of Progress/Problems:  Alexandria Harvey 01/23/2015, 12:10 PM

## 2015-01-23 NOTE — Progress Notes (Signed)
Patient stated she slept well last night. Voice of appetite good and energy low. Depression 4 Hopelessness 0, and anxiety 5. (low 0-10 high). Patient denies suicidal ideations. Patient denies auditory  hallucinations .  Attending unit programming. Limited interaction with his peers. Continue to wear scrubs on unit. Denies pain issues.

## 2015-01-23 NOTE — Progress Notes (Signed)
Recreation Therapy Notes  Date: 05.18.16 Time: 3:00 pm Location: Craft Room  Group Topic: Wellness  Goal Area(s) Addresses:  Patient will identify at least one item per dimension of health. Patient will examine areas they are deficient in.  Behavioral Response: Did not attend  Intervention: 6 Dimensions of Health  Activity: Patients were given a worksheet with the definitions of the 6 dimensions on it and a worksheet with the 6 dimensions on it. Patients were instructed to list 1-2 things per each category that they were currently doing.  Education: LRT educated patients on the 6 dimensions of wellness.   Education Outcome: Patient did not attend group.  Clinical Observations/Feedback: Patient did not attend group.   Jacquelynn CreeGreene,Staley Budzinski M, LRT/CTRS 01/23/2015 4:18 PM

## 2015-01-23 NOTE — Progress Notes (Signed)
D: Patient denies SI/HI/. Patient crying endorsing visual hallucinations of monsters.    Patient affect is and mood are anxious and depressed  Patient did attend evening group. Patient visible on the milieu. No distress noted. A: Support and encouragement offered. Scheduled medications given to pt. Q 15 min checks continued for patient safety. R: Patient receptive. Patient remains safe on the unit.

## 2015-01-24 LAB — GLUCOSE, CAPILLARY
Glucose-Capillary: 112 mg/dL — ABNORMAL HIGH (ref 65–99)
Glucose-Capillary: 154 mg/dL — ABNORMAL HIGH (ref 65–99)
Glucose-Capillary: 111 mg/dL — ABNORMAL HIGH (ref 65–99)
Glucose-Capillary: 108 mg/dL — ABNORMAL HIGH (ref 65–99)

## 2015-01-24 MED ORDER — POLYETHYLENE GLYCOL 3350 17 G PO PACK: 17.0000 g | PACK | Freq: Every day | ORAL | Status: DC | PRN

## 2015-01-24 MED ORDER — CLOZAPINE 100 MG PO TABS
200.0000 mg | ORAL_TABLET | Freq: Every day | ORAL | Status: DC
  Administered 2015-01-24: 200 mg via ORAL
  Filled 2015-01-24: qty 2

## 2015-01-24 NOTE — Progress Notes (Signed)
D: Patient affect is flat and her mood is depressed and anxious.  Patient did attend evening group. Patient visible on the milieu. Patient informed staff that if he did not receive her medications in 15 minutes that she would start to hear voices.  A: Support and encouragement offered. Scheduled medications given to pt. Q 15 min checks continued for patient safety. R: Patient receptive. Patient remains safe on the unit.

## 2015-01-24 NOTE — [facility] Group Notes (Signed)
[facility] Group Notes:  (Nursing/MHT/Case Management/Adjunct)  Date:  01/24/2015  Time:  9:04 AM  Type of Therapy:  Community Meeting   Participation Level:  Did Not Attend  Summary of Progress/Problems:  Alexandria Harvey Alexandria Harvey 01/24/2015, 9:04 AM

## 2015-01-24 NOTE — [facility] Group Notes (Signed)
[facility] Group Notes:  (Nursing/MHT/Case Management/Adjunct)  Date:  01/24/2015  Time:  1:52 PM  Type of Therapy:  Group Therapy  Participation Level:  Did Not Attend  Participation Quality:  none  Affect:  none  Cognitive:  Didn't come  Insight:  None  Engagement in Group:  None  Modes of Intervention:  none  Summary of Progress/Problems:  Alexandria NeerJackie L Grainger Harvey 01/24/2015, 1:52 PM

## 2015-01-24 NOTE — Progress Notes (Addendum)
[facility]  01/24/2015 9:15 AM Alexandria Harvey  MRN:  [id number]  Subjective: Patient reports feeling ok today.  Denies having any hallucinations last night or today. Patient describes her mood as "better".  Denies having suicidal thoughts, or homicidal ideation.  Denies issues with sleep, appetite, energy, poor concentration. Denies having any side effects from her medications other than constipation (however just today patient was complaining about having diarrhea) she says she hasn't had a bowel movement in 2 days.   Per nursing: D: Patient affect is flat and her mood is depressed and anxious. Patient did attend evening group. Patient visible on the milieu. Patient informed staff that if he did not receive her medications in 15 minutes that she would start to hear voices. A: Support and encouragement offered. Scheduled medications given to pt. Q 15 min checks continued for patient safety. R: Patient receptive. Patient remains safe on the unit.   Principal Problem: Schizoaffective disorder, depressive type Diagnosis:   Patient Active Problem List   Diagnosis Date Noted  . Schizoaffective disorder, depressive type [F25.1] 01/18/2015  . Type 2 diabetes mellitus without complication [E11.9]   . MR (mental retardation), moderate [F71]   . ALLERGIC RHINITIS [J30.9] 12/19/2009  . DYSPEPSIA [K30] 09/26/2009  . CONSTIPATION [K59.00] 09/26/2009  . TINEA PEDIS [B35.3] 12/17/2008  . HYPERLIPIDEMIA [E78.5] 01/08/2007  . Essential hypertension [I10] 01/08/2007  . Seizure disorder [G40.909] 01/08/2007   Total Time spent with patient: 30 minutes   Past Medical History:  Past Medical History  Diagnosis Date  . Mild mental retardation   . Excessive anger   . Schizophrenia   . Hallucinations   . Diabetes mellitus without complication   . Hypertension   . Seizures    History reviewed. No pertinent past surgical history. Family History:  Family History  Problem Relation Age of Onset   . Diabetes Mellitus II Mother   . Cancer Father    Social History:  History  Alcohol Use No     History  Drug Use No    History   Social History  . Marital Status: Single    Spouse Name: N/A  . Number of Children: N/A  . Years of Education: N/A   Social History Main Topics  . Smoking status: Never Smoker   . Smokeless tobacco: Not on file  . Alcohol Use: No  . Drug Use: No  . Sexual Activity: No   Other Topics Concern  . None   Social History Narrative   Additional History:    Sleep: Good  Appetite:  Good   Assessment:   Musculoskeletal: Strength & Muscle Tone: within normal limits Gait & Station: normal Patient leans: N/A   Psychiatric Specialty Exam: Physical Exam  Nursing note and vitals reviewed.   Review of Systems  HENT: Negative.   Respiratory: Negative.   Gastrointestinal: Positive for constipation.  Genitourinary: Negative.   Musculoskeletal: Negative.   Psychiatric/Behavioral: Negative.     Blood pressure 125/85, pulse 90, temperature 98 F (36.7 C), temperature source Oral, resp. rate 20, last menstrual period 09/19/2014, SpO2 100 %.There is no weight on file to calculate BMI.  General Appearance: Fairly Groomed  Patent attorneyye Contact::  Fair  Speech:  Slow  Volume:  Decreased  Mood:  Depressed  Affect:  Flat  Thought Process:  Concrete  Orientation:  Full (Time, Place, and Person)  Thought Content:  Hallucinations: None  Suicidal Thoughts:  No  Homicidal Thoughts:  No  Memory:  Immediate;  Fair Recent;   Fair Remote;   Fair  Judgement:  Impaired  Insight:  Lacking  Psychomotor Activity:  Decreased  Concentration:  Fair  Recall:  [employer] of Knowledge:Fair  Language: Fair  Akathisia:  No  Handed:  Right  AIMS (if indicated):     Assets:  Desire for Improvement Financial Resources/Insurance Housing  ADL's:  Intact  Cognition: WNL  Sleep:  Number of Hours: 7.25     Current Medications: Current Facility-Administered  Medications  Medication Dose Route Frequency Provider Last Rate Last Dose  . acetaminophen (TYLENOL) tablet 650 mg  650 mg Oral Q6H PRN Audery Amel, MD      . alum & mag hydroxide-simeth (MAALOX/MYLANTA) 200-200-20 MG/5ML suspension 30 mL  30 mL Oral Q4H PRN Audery Amel, MD      . atenolol (TENORMIN) tablet 50 mg  50 mg Oral Daily Jimmy Footman, MD   50 mg at 01/23/15 0945  . atorvastatin (LIPITOR) tablet 80 mg  80 mg Oral QHS Jimmy Footman, MD   80 mg at 01/23/15 2106  . cloZAPine (CLOZARIL) tablet 175 mg  175 mg Oral QHS Jimmy Footman, MD   175 mg at 01/23/15 2133  . cloZAPine (CLOZARIL) tablet 50 mg  50 mg Oral Daily Jimmy Footman, MD   50 mg at 01/23/15 0943  . divalproex (DEPAKOTE) DR tablet 500 mg  500 mg Oral TID Jimmy Footman, MD   500 mg at 01/23/15 2106  . docusate sodium (COLACE) capsule 200 mg  200 mg Oral BID Jimmy Footman, MD   200 mg at 01/23/15 2106  . ipratropium (ATROVENT) 0.03 % nasal spray 1 spray  1 spray Nasal BID Jimmy Footman, MD   1 spray at 01/23/15 2107  . lisinopril (PRINIVIL,ZESTRIL) tablet 2.5 mg  2.5 mg Oral Daily Jimmy Footman, MD   2.5 mg at 01/23/15 0941  . LORazepam (ATIVAN) tablet 2 mg  2 mg Oral Q6H PRN Jimmy Footman, MD      . magnesium hydroxide (MILK OF MAGNESIA) suspension 30 mL  30 mL Oral Daily PRN Audery Amel, MD      . metFORMIN (GLUCOPHAGE) tablet 1,000 mg  1,000 mg Oral BID WC Jimmy Footman, MD   1,000 mg at 01/24/15 0747  . pantoprazole (PROTONIX) EC tablet 40 mg  40 mg Oral Daily Jimmy Footman, MD   40 mg at 01/23/15 0943  . selenium sulfide (SELSUN) 2.5 % shampoo 1 application  1 application Topical Daily PRN Jimmy Footman, MD      . senna (SENOKOT) tablet 17.2 mg  2 tablet Oral QHS Jimmy Footman, MD   17.2 mg at 01/23/15 2106    Lab Results:  Results for orders placed or performed  during the hospital encounter of 01/18/15 (from the past 48 hour(s))  Glucose, capillary     Status: Abnormal   Collection Time: 01/22/15 11:40 AM  Result Value Ref Range   Glucose-Capillary 130 (H) 65 - 99 mg/dL   Comment 1 Notify RN   Glucose, capillary     Status: Abnormal   Collection Time: 01/22/15  4:14 PM  Result Value Ref Range   Glucose-Capillary 129 (H) 65 - 99 mg/dL  Glucose, capillary     Status: Abnormal   Collection Time: 01/22/15  9:00 PM  Result Value Ref Range   Glucose-Capillary 110 (H) 65 - 99 mg/dL   Comment 1 Notify RN   Glucose, capillary     Status: Abnormal   Collection  Time: 01/23/15  7:52 AM  Result Value Ref Range   Glucose-Capillary 120 (H) 65 - 99 mg/dL  Glucose, capillary     Status: Abnormal   Collection Time: 01/23/15 11:36 AM  Result Value Ref Range   Glucose-Capillary 180 (H) 65 - 99 mg/dL  Glucose, capillary     Status: None   Collection Time: 01/23/15  4:40 PM  Result Value Ref Range   Glucose-Capillary 84 65 - 99 mg/dL   Comment 1 Notify RN   Glucose, capillary     Status: None   Collection Time: 01/23/15  8:56 PM  Result Value Ref Range   Glucose-Capillary 89 65 - 99 mg/dL   Comment 1 Notify RN   Glucose, capillary     Status: Abnormal   Collection Time: 01/24/15  7:45 AM  Result Value Ref Range   Glucose-Capillary 112 (H) 65 - 99 mg/dL    Physical Findings: AIMS:  , ,  ,  ,    CIWA:    COWS:     Treatment Plan Summary: Daily contact with patient to assess and evaluate symptoms and progress in treatment and Medication management   Medical Decision Making:  Established Problem, Stable/Improving (1), Review of Psycho-Social Stressors (1), Review or order clinical lab tests (1) and Review of Medication Regimen & Side Effects (2)   [name] has a history of schizoaffective disorder admitted for worsening psychosis, auditory and visual hallucinations.   1. Continue Clozaril  today I will increase the dose to 50 mg in the morning  and 200 mg by mouth at bedtime .  ANC 6.1 on May 15.  Patient is responding well to the increased dose of Clozaril. Social worker informed me today that she has been discharged from several group homes due to aggression.  I plan to increase their Clozaril between 350 and 400 mg before discharging patient to new group home.  2. Hypertension: Continue atenolol 50 mg twice a day and lisinopril 2.5 mg daily.  Blood pressure and heart rate are within the normal limits  3. Dyslipidemia: Continue Lipitor 80 mg at bedtime  4. Constipation: Continue Colace  200 twice a day and Senokot at bedtime  I will order MiraLAX but only when necessary.  5. Seizures: Continue Depakote DR 500 mg tid. Depakote level drawn on May 12 was 16.  Will recheck level prior to discharge.  6. Diabetes: Continue metformin 500 mg twice a day and NovoLog sliding scale insulin  7. GERD: Continue Protonix 40 mg daily  8. Contraception: Continue Depo-Provera.  I believe her next dose is due in July  9. Disposition: Patient has been accepted in a new group home as informed by her guardian last Friday  Labs we'll order hemoglobin A1c, Depakote level and CBC with differential and TSH for Friday. Lipid panel was checked on May her HDL is 25 and her LDL 138  Precautions we will order fall and seizure precautions   Jimmy FootmanHernandez-Gonzalez,  Jarelly Rinck 01/24/2015, 9:15 AM

## 2015-01-24 NOTE — [facility] Group Notes (Signed)
[facility] LCSW Group Therapy  01/24/2015 3:17 PM  Type of Therapy:  Group Therapy  Participation Level:  Did Not Attend  Participation Quality:  n/a  Affect:  n/a  Cognitive:  n/a  Insight:  n/a  Engagement in Therapy:  n/a  Modes of Intervention:  n/a   Summary of Progress/Problems: Pt did not attend group.  Beryl MeagerIngle, Kamirah Shugrue T 01/24/2015, 3:17 PM

## 2015-01-24 NOTE — Plan of Care (Signed)
Problem: Ineffective individual coping Goal: LTG: Patient will report a decrease in negative feelings Outcome: Progressing Thinking postive

## 2015-01-24 NOTE — Progress Notes (Signed)
Recreation Therapy Notes  Date: 05.19.16  Time: 3:00 pm Location: Craft Room  Group Topic: Leisure Education  Goal Area(s) Addresses:  Increase patient leisure awareness and education  Behavioral Response: Did not attend  Intervention: Leisure Interview  Activity: Patients paired up and were given a list of questions about leisure interests to ask their peer.   Education: LRT educated patients on leisure.  Education Outcome: Patient did not attend group.  Clinical Observations/Feedback: Patient did not attend group.   Jacquelynn CreeGreene,Shannan Garfinkel M, LRT/CTRS 01/24/2015 4:51 PM

## 2015-01-24 NOTE — Plan of Care (Signed)
Problem: [facility] Participation in Recreation Therapeutic Interventions Goal: STG-Patient will identify at least five coping skills for ** STG: Coping Skills - Within 4 treatment sessions, patient will verbalize at least 5 coping skills for anger in each of 2 treatment sessions to increase anger management.  Outcome: Completed/Met Date Met:  01/24/15 Treatment Session 3; Completed 2 out of 2: At approximately 1:05 pm, LRT met with patient in patient room. Patient verbalized 5 coping skills for anger. LRT encouraged patient to use coping skills.  Leon[facility], LRT CTRS 05.19.16 1:19 pm

## 2015-01-24 NOTE — Progress Notes (Signed)
Patient didn't follow through with unit programming this shift . Affect flat and depressed mood. Patient compliant with medications and finger sticks . Encourage patient shower  this shift , odor  noted . Appetite good , noted to sleep most of shift . Voice no concerns around suicidal ideations  auditory and visual hallucinations . Patient shows no interest in outward appearance.

## 2015-01-24 NOTE — Plan of Care (Signed)
Problem: Ineffective individual coping Goal: LTG: Patient will report a decrease in negative feelings Outcome: Progressing Patient states she feels better and is hopeful about discharge Goal: STG: Pt will be able to identify effective and ineffective STG: Pt will be able to identify effective and ineffective coping patterns  Outcome: Progressing Patient states understanding of how to better cope Goal: STG: Patient will remain free from self harm Outcome: Progressing No self-harm Goal: STG:Pt. will utilize relaxation techniques to reduce stress STG: Patient will utilize relaxation techniques to reduce stress levels  Outcome: Progressing Patient relaxing well on the unit, notes satisfaction with her new behavior

## 2015-01-24 NOTE — Tx Team (Signed)
Interdisciplinary Treatment Plan Update (Adult)  Date:  01/24/2015 Time Reviewed:  10:08 AM  Progress in Treatment: Attending groups: Yes. Participating in groups:  Yes. Taking medication as prescribed:  Yes. Tolerating medication:  Yes. Family/Significant other contact made:  Guardian: [name] Patient understands diagnosis:  Yes. Discussing patient identified problems/goals with staff:  Yes. Medical problems stabilized or resolved:  Yes. Denies suicidal/homicidal ideation: Yes. Issues/concerns per patient self-inventory:  Yes. Other:  New problem(s) identified: [facility] Discharge Plan or Barriers: To group home and her psychiatrist  Reason for Continuation of Hospitalization: labs to be ordered tomorrow and assessed, patient stated she is hearing and seeing things. 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 and could discharge tomorrow after lab check, continue closeril titration   Comments: Patient has some cognition issues,but is able to answer simple questions, at times she is a poor historian  Estimated length of stay: up to 4 days  New goal(s): To get better and go back to group home  Review of initial/current patient goals per problem list:   See plan of care goals  Attendees: Patient:   5/19/201610:08 AM  Family:   5/19/201610:08 AM  Physician:  Radene JourneyAndrea Hernandez, MD 5/19/201610:08 AM  Nursing:    5/19/201610:08 AM  Case Manager:   5/19/201610:08 AM  Counselor:   5/19/201610:08 AM  Other: Delorse LekLynn Washington, LCSW 5/19/201610:08 AM  Other:  Beryl MeagerJason Rosalva Neary, LCSWA 5/19/201610:08 AM  Other:  Sheppard PentonLee Ann WillmarWashington, PsyD 5/19/201610:08 AM  Other: Hershal CoriaBeth Greene, LRT 5/19/201610:08 AM  Other:  5/19/201610:08 AM  Other:  5/19/201610:08 AM  Other:  5/19/201610:08 AM  Other:  5/19/201610:08 AM  Other:  5/19/201610:08 AM  Other:   5/19/201610:08 AM   Scribe for Treatment  Team:   Beryl MeagerIngle, Alailah Safley T, 01/24/2015, 10:08 AM

## 2015-01-25 DIAGNOSIS — F71 Moderate intellectual disabilities: Secondary | ICD-10-CM

## 2015-01-25 LAB — CBC WITH DIFFERENTIAL/PLATELET
BASOS ABS: 0.1 10*3/uL (ref 0–0.1)
Basophils Relative: 1 %
EOS PCT: 2 %
Eosinophils Absolute: 0.2 10*3/uL (ref 0–0.7)
HCT: 39.6 % (ref 35.0–47.0)
Hemoglobin: 12.6 g/dL (ref 12.0–16.0)
LYMPHS ABS: 4.2 10*3/uL — AB (ref 1.0–3.6)
Lymphocytes Relative: 45 %
MCH: 27.1 pg (ref 26.0–34.0)
MCHC: 31.7 g/dL — AB (ref 32.0–36.0)
MCV: 85.3 fL (ref 80.0–100.0)
Monocytes Absolute: 0.5 10*3/uL (ref 0.2–0.9)
Monocytes Relative: 6 %
Neutro Abs: 4.2 10*3/uL (ref 1.4–6.5)
Neutrophils Relative %: 46 %
PLATELETS: 180 10*3/uL (ref 150–440)
RBC: 4.64 MIL/uL (ref 3.80–5.20)
RDW: 17.3 % — AB (ref 11.5–14.5)
WBC: 9.4 10*3/uL (ref 3.6–11.0)

## 2015-01-25 LAB — TSH: TSH: 2.897 u[IU]/mL (ref 0.350–4.500)

## 2015-01-25 LAB — VALPROIC ACID LEVEL: Valproic Acid Lvl: 72 ug/mL (ref 50.0–100.0)

## 2015-01-25 MED ORDER — CLOZAPINE 50 MG PO TABS: 50.0000 mg | ORAL_TABLET | Freq: Every day | ORAL | Status: DC

## 2015-01-25 MED ORDER — POLYETHYLENE GLYCOL 3350 17 G PO PACK: 17.0000 g | PACK | Freq: Every day | ORAL | Status: DC | PRN

## 2015-01-25 MED ORDER — CLOZAPINE 200 MG PO TABS: 200.0000 mg | ORAL_TABLET | Freq: Every day | ORAL | Status: DC

## 2015-01-25 MED ORDER — METFORMIN HCL 1000 MG PO TABS
1000.0000 mg | ORAL_TABLET | Freq: Two times a day (BID) | ORAL | Status: DC
Start: 2015-01-25 — End: 2017-06-29

## 2015-01-25 MED ORDER — SENNA 8.6 MG PO TABS: 2.0000 | ORAL_TABLET | Freq: Every day | ORAL | Status: DC

## 2015-01-25 MED ORDER — ATORVASTATIN CALCIUM 80 MG PO TABS: 80.0000 mg | ORAL_TABLET | Freq: Every day | ORAL | Status: DC

## 2015-01-25 MED ORDER — LISINOPRIL 2.5 MG PO TABS: 2.5000 mg | ORAL_TABLET | Freq: Every day | ORAL | Status: DC

## 2015-01-25 MED ORDER — ATENOLOL 50 MG PO TABS: 50.0000 mg | ORAL_TABLET | Freq: Every day | ORAL | Status: DC

## 2015-01-25 MED ORDER — SELENIUM SULFIDE 2.5 % EX LOTN: 1.0000 "application " | TOPICAL_LOTION | Freq: Every day | CUTANEOUS | Status: DC | PRN

## 2015-01-25 MED ORDER — IPRATROPIUM BROMIDE 0.03 % NA SOLN: 1.0000 | Freq: Two times a day (BID) | NASAL | Status: DC

## 2015-01-25 MED ORDER — DOCUSATE SODIUM 100 MG PO CAPS: 200.0000 mg | ORAL_CAPSULE | Freq: Two times a day (BID) | ORAL | Status: DC

## 2015-01-25 MED ORDER — DIVALPROEX SODIUM 500 MG PO DR TAB: 500.0000 mg | DELAYED_RELEASE_TABLET | Freq: Three times a day (TID) | ORAL | Status: DC

## 2015-01-25 MED ORDER — PANTOPRAZOLE SODIUM 40 MG PO TBEC: 40.0000 mg | DELAYED_RELEASE_TABLET | Freq: Every day | ORAL | Status: DC

## 2015-01-25 MED ORDER — METFORMIN HCL 1000 MG PO TABS: 1000.0000 mg | ORAL_TABLET | Freq: Two times a day (BID) | ORAL | Status: DC

## 2015-01-25 NOTE — Progress Notes (Signed)
Patient refusing blood glucose secondary to anticipated discharge. Values have been within normal limits. MD aware.

## 2015-01-25 NOTE — [facility] Group Notes (Signed)
[facility]  01/25/2015 1:28 PM  Participation Quality:  Appropriate and Attentive  Affect:  Appropriate  Cognitive:  Alert, Appropriate and Oriented  Insight:  Engaged  Engagement in Group:  Engaged  Modes of Intervention:  Socialization and Support  Summary of Progress/Problems: Pt attended group and partciapated appropriately in discussion. Pt reports that her SMART goal is to "discharge today and follow directions in the group home". Pt states that she understands that staff at the group home are only trying to help her.  Beryl MeagerIngle, Lynnet Hefley T 01/25/2015, 1:28 PM

## 2015-01-25 NOTE — Progress Notes (Signed)
Patient discharged ambulatory to group home manager. Patient denies SI or HI. Discharge instructions reviewed with patient and manager, they verbalize understanding. Patient received all personal belongings, copy of discharge instructions, prescriptions, and a supply of home medications.

## 2015-01-25 NOTE — Progress Notes (Signed)
[facility] AVS [facility];  Follow-up

## 2015-01-25 NOTE — Progress Notes (Signed)
  [facility] :  Will you be returning to the same living situation after discharge:  Yes [facility] [phone] [place] At discharge, do you have transportation home?: Yes,    Do you have the ability to pay for your medications: Yes,     Release of information consent forms completed and in the chart;  Patient's signature needed at discharge.  Patient to Follow up at: Follow-up Information    Follow up with [facility]. Go in 1 week.   Why:  Hospital Discharge Follow up. [facility] for psychiatric assessment and follow up. This is to be arranged through Group Home coordinator as Per Guardian [name]   Contact information:   [facility] [address] [place]  ph # [phone] fax [phone]      Patient denies SI/HI: Yes,       Safety Planning and Suicide Prevention discussed: Yes,  Reviewed with patient on Sunday May 15th and handout provided  Have you used any form of tobacco in the last 30 days? (Cigarettes, Smokeless Tobacco, Cigars, and/or Pipes): No  Has patient been referred to the Quitline?: Patient refused referral  Cheron SchaumannBandi, Ephram Kornegay M 01/25/2015, 12:37 PM

## 2015-01-25 NOTE — [facility] Group Notes (Signed)
[facility] Group Notes:  (Nursing/MHT/Case Management/Adjunct)  Date:  01/25/2015  Time:  11:41 AM  Type of Therapy:  Group Therapy  Participation Level:  Active  Participation Quality:  Appropriate and Attentive  Affect:  Appropriate  Cognitive:  Alert and Appropriate  Insight:  Appropriate  Engagement in Group:  Engaged  Modes of Intervention:  Activity  Summary of Progress/Problems:  Alexandria Harvey De'Chelle Meril Dray 01/25/2015, 11:41 AM

## 2015-01-25 NOTE — [facility] Suicide Risk Assessment (Signed)
[facility] INPATIENT:  Family/Significant Other Suicide Prevention Education  Suicide Prevention Education: I-1 education provided on Sunday May 15th/16- Handout given to patient Patient Discharged to Other Healthcare Facility:  Suicide Prevention Education Not Provided: {PT. DISCHARGED TO OTHER HEALTHCARE FACILITY:SUICIDE PREVENTION EDUCATION NOT PROVIDED   The patient is discharging to another healthcare facility for continuation of treatment.  The patient's medical information, including suicide ideations and risk factors, are a part of the medical information shared with the receiving healthcare facility.  Alexandria Harvey, Makinzy Cleere M 01/25/2015, 12:36 PM

## 2015-01-25 NOTE — Discharge Summary (Signed)
Physician Discharge Summary Note  Patient:  Alexandria Harvey is an 34 y.o., female MRN:  [id number] DOB:  Jul 14, 1981 Patient phone:  [phone] (home)  Patient address:   [place] [address] [address],  Total Time spent with patient: 30 minutes  Date of Admission:  01/18/2015 Date of Discharge: 01/25/2015  Reason for Admission:  Auditory and visual hallucinations  Principal Problem: Schizoaffective disorder, depressive type Discharge Diagnoses: Patient Active Problem List   Diagnosis Date Noted  . 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 Schizoaffective disorder, depressive type [F25.1] 01/18/2015  . Type 2 diabetes mellitus without complication [R15.4]   . ALLERGIC RHINITIS [J30.9] 12/19/2009  . DYSPEPSIA [K30] 09/26/2009  . CONSTIPATION [K59.00] 09/26/2009  . TINEA PEDIS [B35.3] 12/17/2008  . HYPERLIPIDEMIA [E78.5] 01/08/2007  . Essential hypertension [I10] 01/08/2007  . Seizure disorder [G40.909] 01/08/2007    Musculoskeletal: Strength & Muscle Tone: within normal limits Gait & Station: normal Patient leans: N/A  Psychiatric Specialty Exam: Physical Exam  Review of Systems  Respiratory: Negative for cough.   Cardiovascular: Negative for chest pain.  Gastrointestinal: Negative for nausea, vomiting, abdominal pain, diarrhea and constipation.  Musculoskeletal: Negative for myalgias and back pain.  Skin: Negative for rash.  Neurological: Negative for headaches.  Psychiatric/Behavioral: Negative for depression, suicidal ideas, hallucinations and substance abuse. The patient is not nervous/anxious and does not have insomnia.     Blood pressure 105/59, pulse 88, temperature 98 F (36.7 C), temperature source Oral, resp. rate 20, last menstrual period 09/19/2014, SpO2 100 %.There is no weight on file to calculate BMI.  General Appearance: Fairly Groomed  Engineer, water::  Good  Speech:  Slow  Volume:  Decreased  Mood:  Euthymic  Affect:  Constricted   Thought Process:  Concrete  Orientation:  Full (Time, Place, and Person)  Thought Content:  Hallucinations: None  Suicidal Thoughts:  No  Homicidal Thoughts:  No  Memory:  Immediate;   Fair Recent;   Fair Remote;   Fair  Judgement:  Poor  Insight:  Shallow  Psychomotor Activity:  Decreased  Concentration:  Good  Recall:  NA  Fund of Knowledge:Poor  Language: Fair  Akathisia:  No  Handed:    AIMS (if indicated):     Assets:  Financial Resources/Insurance Housing Social Support  ADL's:  Intact  Cognition: WNL  Sleep:  Number of Hours: 7.75   Have you used any form of tobacco in the last 30 days? (Cigarettes, Smokeless Tobacco, Cigars, and/or Pipes): No  Has this patient used any form of tobacco in the last 30 days? (Cigarettes, Smokeless Tobacco, Cigars, and/or Pipes) No  Level of Care:  OP    History of Present Illness: "I've been hearing things and seeing things".  Alexandria Harvey is a 34 y.o. female with a history that includes mental retardationand schizophrenia vs schizoaffective disorder. Patient is under the guardianship of [facility]. Patient presents to our ER with several days of auditory or visual hallucinations. She reported that the auditory hallucinations are telling her to kill herself, though she does not want to.  Per emergency room psychiatry's patient presented without any commitment papers. Patient reported that she's been hearing things and seeing things. Seeing monsters and hearing voices that tell her to hurt herself. She described her mood as sad, and as stated that her energy was low and she was not sleeping well. Patient denied any recent stressors other than she was going to be moving into a new living situation within the  next couple of days. She said she was getting along well at the group home and said that she was compliant with medication. Patient was in our emergency department earlier this week with similar complaints. Just today  she reported that the hallucinations were worse and that she was thinking about hurting herself.   Today the patient was interviewed. She reports that she has been crying and feels sad because she is seeing alligators, wolfs and snakes which frightens her. Patient states she is having thoughts about hurting herself and others and that she hears voices that tell her to go ahead and do it. As far as side effects from medications she complains of tremors and feeling like her heart is pounding. Denies any physical complaints today. Some paranoia was noted during the assessment as patient says that somebody was changing her medications and putting Zyprexa with her current medications. She complains that Zyprexa makes her see things.  Collateral information was obtained from patient's guardian phone number [phone]. She reports that the patient has been on 4 different group homes since November 2015. She was released from [facility] in November 2015. She was just discharged from [facility] 2 weeks ago. Patient is not currently set up to follow up with any specific agency as she has been in and out of hospitals. Patient has been [facility] [place] for number [phone]. Guardian states that she has completed an application for [facility] for long-term treatment. This patient has a care coordinator for number [phone]. It is unclear as to what led the patient to be discharged from her most recent group home. These group home's telephone number is [phone]. However per records K she has a history of aggression.   Substance abuse history : Denies substance abuse.   HPI Elements: Quality: Auditory and visual hallucinations with commands to harm herself. Severity: Moderate to severe. Timing: Getting worse over the last couple days. Duration: Intermittent. Context: Living situation is about to change .  Total Time  spent with patient: 1 hour   Past psychiatric history: Past history positive for multiple psychiatric hospitalizations. Recently discharged from [facility]. Has been seen: Behavioral health as well. Developmental disability apparently and schizophrenia or schizoaffective disorder. Has been responsive to medication the past. Per guardian she was at [facility] in November 2015 since then she's been placing 4 different group homes. She was just discharged from [facility] 2 weeks ago after a one month stay.  Past Medical History:  Medical history positive for diabetes hypertension seizures. No acute medical complaints Past Medical History  Diagnosis Date  . Mild mental retardation   . Excessive anger   . Schizophrenia   . Hallucinations   . Diabetes mellitus without complication   . Hypertension   . Seizures    History reviewed. No pertinent past surgical history.  Family History: Patient's unclear about that. Family History  Problem Relation Age of Onset  . Diabetes Mellitus II Mother   . Cancer Father    Social History: Social history as the patient just recently was discharged from an inpatient hospital. Currently he had a new group home. She says her guardian was getting ready to place her in another new place soon. Patient is under the guardianship of [facility]. History  Alcohol Use No    History  Drug Use No    History   Social History  . Marital Status: Single  Spouse Name: N/A  . Number of Children: N/A  . Years of Education: N/A   Social History Main Topics  . Smoking status: Never Smoker   . Smokeless tobacco: Not on file  . Alcohol Use: No  . Drug Use: No  . Sexual Activity: No        Hospital Course:  Alexandria Harvey has a history of schizoaffective disorder admitted for worsening psychosis, auditory and visual hallucinations.   1. Continue Clozaril during her stay in the  hospital the dose of Clozaril was titrated up to 50 mg in the morning and 200 mg daily at bedtime.  ANC at discharge was 4.2 . Patient is responding well to the increased dose of Clozaril. Social worker informed me today that she has been discharged from several group homes due to aggression.  2. Hypertension: Continue atenolol 50 mg twice a day and lisinopril 2.5 mg daily. Blood pressure and heart rate are within the normal limits  3. Dyslipidemia: Continue Lipitor 80 mg at bedtime  4. Constipation: Continue Colace 200 twice a day and Senokot at bedtime I will order MiraLAX but only when necessary.  5. Seizures: Continue Depakote DR 500 mg tid. Depakote level on the day of the discharge was 72   6. Diabetes: Continue metformin 500 mg twice a day and NovoLog sliding scale insulin  7. GERD: Continue Protonix 40 mg daily  8. Contraception: Continue Depo-Provera. I believe her next dose is due in July  9. Disposition: Patient has been accepted in a new group home in [facility].  Labs: TSH within the normal limits. Depakote level on discharge 72, ANC 4.2 Lipid panel:  her HDL is 25 and her LDL 138  On the day of the discharge the patient stated that she was no longer having auditory or visual hallucinations. She described her mood as much improved. Denied major problems with appetite, energy, sleep, or concentration. She denied having any side effects from her medications. She denied having any physical complaints. During her stay there were no behavioral problems. The patient did not require seclusion, restraints or forced medications. This hospitalization was uneventful. The patient had limited participation in programming and only attended 1 or 2 groups.  Consults:  None  Significant Diagnostic Studies:  None  Discharge Vitals:   Blood pressure 105/59, pulse 88, temperature 98 F (36.7 C), temperature source Oral, resp. rate 20, last menstrual period 09/19/2014, SpO2  100 %. There is no weight on file to calculate BMI. Lab Results:    Results for RASHELL, SHAMBAUGH (MRN [id number]) as of 01/25/2015 12:55  Ref. Range 01/25/2015 06:44  WBC Latest Ref Range: 3.6-11.0 K/uL 9.4  RBC Latest Ref Range: 3.80-5.20 MIL/uL 4.64  Hemoglobin Latest Ref Range: 12.0-16.0 g/dL 12.6  HCT Latest Ref Range: 35.0-47.0 % 39.6  MCV Latest Ref Range: 80.0-100.0 fL 85.3  MCH Latest Ref Range: 26.0-34.0 pg 27.1  MCHC Latest Ref Range: 32.0-36.0 g/dL 31.7 (L)  RDW Latest Ref Range: 11.5-14.5 % 17.3 (H)  Platelets Latest Ref Range: 150-440 K/uL 180  Neutrophils Latest Units: % 46  Lymphocytes Latest Units: % 45  Monocytes Relative Latest Units: % 6  Eosinophil Latest Units: % 2  Basophil Latest Units: % 1  NEUT# Latest Ref Range: 1.4-6.5 K/uL 4.2  Lymphocyte # Latest Ref Range: 1.0-3.6 K/uL 4.2 (H)  Monocyte # Latest Ref Range: 0.2-0.9 K/uL 0.5  Eosinophils Absolute Latest Ref Range: 0-0.7 K/uL 0.2  Basophils Absolute Latest Ref Range: 0-0.1 K/uL 0.1  Valproic Acid Lvl Latest Ref Range: 50.0-100.0 ug/mL 72  TSH Latest Ref Range: 0.350-4.500 uIU/mL 2.897   Results for ANNAH, JASKO (MRN [id number]) as of 01/25/2015 12:55  Ref. Range 01/16/2015 21:20 01/17/2015 09:15 01/17/2015 17:35 01/18/2015 06:57  Sodium Latest Ref Range: 135-145 mmol/L 141     Potassium Latest Ref Range: 3.5-5.1 mmol/L 4.0     Chloride Latest Ref Range: 101-111 mmol/L 104     CO2 Latest Ref Range: 22-32 mmol/L 22     BUN Latest Ref Range: 6-20 mg/dL 17     Creatinine Latest Ref Range: 0.44-1.00 mg/dL 0.73     Calcium Latest Ref Range: 8.9-10.3 mg/dL 9.6     EGFR (Non-African Amer.) Latest Ref Range: >60 mL/min >60     EGFR (African American) Latest Ref Range: >60 mL/min >60     Glucose Latest Ref Range: 65-99 mg/dL 128 (H)     Anion gap Latest Ref Range: 5-15  15     Alkaline Phosphatase Latest Ref Range: 38-126 U/L 67     Albumin Latest Ref Range: 3.5-5.0 g/dL 3.9     AST Latest Ref Range: 15-41 U/L 23      ALT Latest Ref Range: 14-54 U/L 16     Total Protein Latest Ref Range: 6.5-8.1 g/dL 8.0     Total Bilirubin Latest Ref Range: 0.3-1.2 mg/dL 0.6     Cholesterol Latest Ref Range: 0-200 mg/dL    200  Triglycerides Latest Ref Range: <150 mg/dL    135  HDL Cholesterol Latest Ref Range: >40 mg/dL    25 (L)  LDL (calc) Latest Ref Range: 0-99 mg/dL    148 (H)  VLDL Latest Ref Range: 0-40 mg/dL    27  Total CHOL/HDL Ratio Latest Units: RATIO    8.0  WBC Latest Ref Range: 3.6-11.0 K/uL 13.6 (H)     RBC Latest Ref Range: 3.80-5.20 MIL/uL 4.86     Hemoglobin Latest Ref Range: 12.0-16.0 g/dL 13.1     HCT Latest Ref Range: 35.0-47.0 % 42.0     MCV Latest Ref Range: 80.0-100.0 fL 86.4     MCH Latest Ref Range: 26.0-34.0 pg 27.0     MCHC Latest Ref Range: 32.0-36.0 g/dL 31.2 (L)     RDW Latest Ref Range: 11.5-14.5 % 17.9 (H)     Platelets Latest Ref Range: 833-825 K/uL 053     Salicylate Lvl Latest Ref Range: 2.8-30.0 mg/dL <4.0     Valproic Acid Lvl Latest Ref Range: 50.0-100.0 ug/mL    16 (L)  Acetaminophen (Tylenol), S Latest Ref Range: 10-30 ug/mL <10 (L)     Preg Test, Ur Latest Ref Range: NEGATIVE   NEGATIVE    Appearance Latest Ref Range: CLEAR   CLEAR (A)    Bacteria, UA Latest Ref Range: NONE SEEN   NONE SEEN    Bilirubin Urine Latest Ref Range: NEGATIVE   NEGATIVE    Color, Urine Latest Ref Range: YELLOW   YELLOW (A)    Glucose Latest Ref Range: NEGATIVE mg/dL  NEGATIVE    Hgb urine dipstick Latest Ref Range: NEGATIVE   NEGATIVE    Ketones, ur Latest Ref Range: NEGATIVE mg/dL  1+ (A)    Leukocytes, UA Latest Ref Range: NEGATIVE   NEGATIVE    Mucous Unknown  PRESENT    Nitrite Latest Ref Range: NEGATIVE   NEGATIVE    pH Latest Ref Range: 5.0-8.0   5.0    Protein Latest Ref Range: NEGATIVE mg/dL  NEGATIVE  RBC / HPF Latest Ref Range: 0-5 RBC/hpf  0-5    Specific Gravity, Urine Latest Ref Range: 1.005-1.030   1.019    Squamous Epithelial / LPF Latest Ref Range: NONE SEEN   0-5 (A)     WBC, UA Latest Ref Range: 0-5 WBC/hpf  0-5    Alcohol, Ethyl (B) Latest Ref Range: <5 mg/dL <5     Amphetamines, Ur Screen Latest Ref Range: NONE DETECTED   NONE DETECTED    Barbiturates, Ur Screen Latest Ref Range: NONE DETECTED   NONE DETECTED    Benzodiazepine, Ur Scrn Latest Ref Range: NONE DETECTED   NONE DETECTED    Cocaine Metabolite,Ur [place] Latest Ref Range: NONE DETECTED   NONE DETECTED    Methadone Scn, Ur Latest Ref Range: NONE DETECTED   NONE DETECTED    MDMA (Ecstasy)Ur Screen Latest Ref Range: NONE DETECTED   NONE DETECTED    Cannabinoid 50 Ng, Ur [place] Latest Ref Range: NONE DETECTED   NONE DETECTED    Opiate, Ur Screen Latest Ref Range: NONE DETECTED   NONE DETECTED    Phencyclidine (PCP) Ur S Latest Ref Range: NONE DETECTED   NONE DETECTED    Tricyclic, Ur Screen Latest Ref Range: NONE DETECTED   NONE DETECTED      Discharge destination:  Other:  Group home  Is patient on multiple antipsychotic therapies at discharge:  No   Has Patient had three or more failed trials of antipsychotic monotherapy by history:  No    Recommended Plan for Multiple Antipsychotic Therapies: NA      Discharge Instructions    Diet - low sodium heart healthy    Complete by:  As directed      Diet Carb Modified    Complete by:  As directed             Medication List    STOP taking these medications        bisacodyl 5 MG EC tablet  Commonly known as:  DULCOLAX     glucose blood test strip  Commonly known as:  ONE TOUCH ULTRA TEST     insulin aspart 100 UNIT/ML FlexPen  Commonly known as:  NOVOLOG     omeprazole 20 MG capsule  Commonly known as:  PRILOSEC  Replaced by:  pantoprazole 40 MG tablet     onetouch ultrasoft lancets      TAKE these medications      Indication   atenolol 50 MG tablet  Commonly known as:  TENORMIN  Take 1 tablet (50 mg total) by mouth daily.  Notes to Patient:  Hypertension and tachycardia      atorvastatin 80 MG tablet  Commonly known as:   LIPITOR  Take 1 tablet (80 mg total) by mouth at bedtime.  Notes to Patient:  High cholesterol      clozapine 200 MG tablet  Commonly known as:  CLOZARIL  Take 1 tablet (200 mg total) by mouth at bedtime.  Notes to Patient:  Psychosis      clozapine 50 MG tablet  Commonly known as:  CLOZARIL  Take 1 tablet (50 mg total) by mouth daily.  Notes to Patient:  Psychosis      divalproex 500 MG DR tablet  Commonly known as:  DEPAKOTE  Take 1 tablet (500 mg total) by mouth 3 (three) times daily.  Notes to Patient:  Seizure disorder      docusate sodium 100 MG capsule  Commonly known as:  COLACE  Take 2 capsules (200 mg total) by mouth 2 (two) times daily.  Notes to Patient:  Constipation      ipratropium 0.03 % nasal spray  Commonly known as:  ATROVENT  Place 1 spray into the nose 2 (two) times daily.  Notes to Patient:  Drooling secondary to Clozaril.  Use under the tongue (not through nasal route)      lisinopril 2.5 MG tablet  Commonly known as:  PRINIVIL,ZESTRIL  Take 1 tablet (2.5 mg total) by mouth daily.  Notes to Patient:  Hypertension      medroxyPROGESTERone 150 MG/ML injection  Commonly known as:  DEPO-PROVERA  Inject 150 mg into the muscle every 3 (three) months.  Notes to Patient:  Contraception      metFORMIN 1000 MG tablet  Commonly known as:  GLUCOPHAGE  Take 1 tablet (1,000 mg total) by mouth 2 (two) times daily with a meal.  Notes to Patient:  Diabetes      ONE TOUCH ULTRA 2 W/DEVICE Kit  Monitor blood sugars at least 2 times daily.  Notes to Patient:  Diabetes      pantoprazole 40 MG tablet  Commonly known as:  PROTONIX  Take 1 tablet (40 mg total) by mouth daily.  Notes to Patient:  Acid reflux      polyethylene glycol packet  Commonly known as:  MIRALAX / GLYCOLAX  Take 17 g by mouth daily as needed for moderate constipation.  Notes to Patient:  Constipation      selenium sulfide 2.5 % shampoo  Commonly known as:  SELSUN  Apply 1 application  topically daily as needed for irritation.  Notes to Patient:  Dandruff      senna 8.6 MG Tabs tablet  Commonly known as:  SENOKOT  Take 2 tablets (17.2 mg total) by mouth at bedtime.  Notes to Patient:  Constipation        Follow-up Information    Follow up with [facility]. Go in 1 week.   Why:  Hospital Discharge Follow up. Patient is to be taken to [facility] in clinic within the next week for psychiatric assessment and follow up. This is to be arranged through [place] coordinator as Per Guardian [name]   Contact information:   [facility]  ph # [phone] fax [phone]      Follow-up recommendations:  Other:  Follow-up with [facility]  Comments:    Total Discharge Time: 30 minutes  Signed: Hildred Priest 01/25/2015, 1:04 PM

## 2015-01-25 NOTE — Progress Notes (Signed)
Recreation Therapy Notes  INPATIENT RECREATION TR PLAN  Patient Details Name: Alexandria Harvey MRN: [id number] DOB: [birth year] Today's Date: 01/25/2015  Rec Therapy Plan Is patient appropriate for Therapeutic Recreation?: Yes Treatment times per week: At least 2 times a week TR Treatment/Interventions: 1:1 session, Group participation (Comment) (Appropriate participation in daily recreation therapy tx)  Discharge Criteria Pt will be discharged from therapy if:: Discharged Treatment plan/goals/alternatives discussed and agreed upon by:: Patient/family  Discharge Summary Short term goals set: See Care Plan Short term goals met: Complete Progress toward goals comments: One-to-one attended One-to-one attended: Anger management Reason goals not met: N/A Therapeutic equipment acquired: None Reason patient discharged from therapy: Discharge from hospital Pt/family agrees with progress & goals achieved: Yes Date patient discharged from therapy: 01/25/15   Leonette [facility], LRT/CTRS 01/25/2015, 1:18 PM

## 2015-01-25 NOTE — Outcomes Assessment (Signed)
Patient states she is feeling much better and feels she has gained the new skills she needs to be more appropriate in her home. She states [place] is being [place] and is confident she will do well. She denies SI, HI, and AVH and notes no distress.

## 2015-01-25 NOTE — [facility] Suicide Risk Assessment (Signed)
[facility]   Demographic Factors:  Caucasian  Total Time spent with patient: 30 minutes  Musculoskeletal: Strength & Muscle Tone: within normal limits Gait & Station: normal Patient leans: N/A  Psychiatric Specialty Exam: Physical Exam  ROS  Blood pressure 105/59, pulse 88, temperature 98 F (36.7 C), temperature source Oral, resp. rate 20, last menstrual period 09/19/2014, SpO2 100 %.There is no weight on file to calculate BMI.                                                       Have you used any form of tobacco in the last 30 days? (Cigarettes, Smokeless Tobacco, Cigars, and/or Pipes): No  Has this patient used any form of tobacco in the last 30 days? (Cigarettes, Smokeless Tobacco, Cigars, and/or Pipes) No  Mental Status Per Nursing Assessment::   On Admission:     Current Mental Status by Physician: Denies suicidality, homicidality or having auditory or visual hallucinations. Patient reports feeling much improved.  No evidence of psychosis, impulsivity or aggression  Loss Factors: Multiple changes in group homes over the last couple of months  Historical Factors: Impulsivity and History of violence and psychosis  Risk Reduction Factors:   Living with another person, especially a relative and Positive social support  Continued Clinical Symptoms:  Schizophrenia:   Command hallucinatons Epilepsy  Cognitive Features That Contribute To Risk:  None    Suicide Risk:  Minimal: No identifiable suicidal ideation.  Patients presenting with no risk factors but with morbid ruminations; may be classified as minimal risk based on the severity of the depressive symptoms  Principal Problem: Schizoaffective disorder, depressive type Discharge Diagnoses:  Patient Active Problem List   Diagnosis Date Noted  . Schizoaffective disorder, depressive type [F25.1] 01/18/2015  . Type 2 diabetes mellitus without complication [E11.9]    . MR (mental retardation), moderate [F71]   . ALLERGIC RHINITIS [J30.9] 12/19/2009  . DYSPEPSIA [K30] 09/26/2009  . CONSTIPATION [K59.00] 09/26/2009  . TINEA PEDIS [B35.3] 12/17/2008  . HYPERLIPIDEMIA [E78.5] 01/08/2007  . Essential hypertension [I10] 01/08/2007  . Seizure disorder [G40.909] 01/08/2007      Plan Of Care/Follow-up recommendations:  Other:  Follow-up with outpatient provider  Is patient on multiple antipsychotic therapies at discharge:  No   Has Patient had three or more failed trials of antipsychotic monotherapy by history:  No  Recommended Plan for Multiple Antipsychotic Therapies: NA    Jimmy FootmanHernandez-Gonzalez,  Hartley [place] 01/25/2015, 9:34 AM

## 2015-01-26 LAB — HEMOGLOBIN A1C: HEMOGLOBIN A1C: 7 % — AB (ref 4.0–6.0)

## 2015-02-05 ENCOUNTER — Emergency Department (HOSPITAL_COMMUNITY)
Admission: EM | Admit: 2015-02-05 | Discharge: 2015-02-06 | Disposition: A | Discharge status: Home / Self Care | Payer: Medicare Other | Attending: Emergency Medicine | Admitting: Emergency Medicine

## 2015-02-05 ENCOUNTER — Encounter (HOSPITAL_COMMUNITY): Payer: Self-pay | Admitting: Emergency Medicine

## 2015-02-05 DIAGNOSIS — Z3202 Encounter for pregnancy test, result negative: Secondary | ICD-10-CM | POA: Insufficient documentation

## 2015-02-05 DIAGNOSIS — R451 Restlessness and agitation: Secondary | ICD-10-CM | POA: Insufficient documentation

## 2015-02-05 DIAGNOSIS — I10 Essential (primary) hypertension: Secondary | ICD-10-CM | POA: Insufficient documentation

## 2015-02-05 DIAGNOSIS — Z7951 Long term (current) use of inhaled steroids: Secondary | ICD-10-CM | POA: Insufficient documentation

## 2015-02-05 DIAGNOSIS — G40909 Epilepsy, unspecified, not intractable, without status epilepticus: Secondary | ICD-10-CM | POA: Insufficient documentation

## 2015-02-05 DIAGNOSIS — Z794 Long term (current) use of insulin: Secondary | ICD-10-CM | POA: Insufficient documentation

## 2015-02-05 DIAGNOSIS — F251 Schizoaffective disorder, depressive type: Secondary | ICD-10-CM | POA: Diagnosis present

## 2015-02-05 DIAGNOSIS — Z79899 Other long term (current) drug therapy: Secondary | ICD-10-CM | POA: Insufficient documentation

## 2015-02-05 DIAGNOSIS — E119 Type 2 diabetes mellitus without complications: Secondary | ICD-10-CM | POA: Insufficient documentation

## 2015-02-05 LAB — CBC
HCT: 40.4 % (ref 36.0–46.0)
HEMOGLOBIN: 12.6 g/dL (ref 12.0–15.0)
MCH: 27.2 pg (ref 26.0–34.0)
MCHC: 31.2 g/dL (ref 30.0–36.0)
MCV: 87.1 fL (ref 78.0–100.0)
Platelets: 214 10*3/uL (ref 150–400)
RBC: 4.64 MIL/uL (ref 3.87–5.11)
RDW: 16.8 % — ABNORMAL HIGH (ref 11.5–15.5)
WBC: 12 10*3/uL — AB (ref 4.0–10.5)

## 2015-02-05 LAB — RAPID URINE DRUG SCREEN, HOSP PERFORMED
Amphetamines: NOT DETECTED
BENZODIAZEPINES: NOT DETECTED
Barbiturates: NOT DETECTED
COCAINE: NOT DETECTED
Opiates: NOT DETECTED
Tetrahydrocannabinol: NOT DETECTED

## 2015-02-05 LAB — ETHANOL: Alcohol, Ethyl (B): <5 mg/dL (ref <5)

## 2015-02-05 LAB — COMPREHENSIVE METABOLIC PANEL
ALBUMIN: 3.7 g/dL (ref 3.5–5.0)
ALT: 14 U/L (ref 14–54)
AST: 22 U/L (ref 15–41)
Alkaline Phosphatase: 54 U/L (ref 38–126)
Anion gap: 14 (ref 5–15)
BUN: 17 mg/dL (ref 6–20)
CALCIUM: 9.4 mg/dL (ref 8.9–10.3)
CO2: 19 mmol/L — ABNORMAL LOW (ref 22–32)
CREATININE: 0.66 mg/dL (ref 0.44–1.00)
Chloride: 107 mmol/L (ref 101–111)
GFR calc Af Amer: >60 mL/min (ref >60)
GFR calc non Af Amer: >60 mL/min (ref >60)
Glucose, Bld: 163 mg/dL — ABNORMAL HIGH (ref 65–99)
Potassium: 4.3 mmol/L (ref 3.5–5.1)
Sodium: 140 mmol/L (ref 135–145)
Total Bilirubin: 0.3 mg/dL (ref 0.3–1.2)
Total Protein: 8 g/dL (ref 6.5–8.1)

## 2015-02-05 LAB — ACETAMINOPHEN LEVEL: Acetaminophen (Tylenol), Serum: <10 ug/mL — ABNORMAL LOW (ref 10–30)

## 2015-02-05 LAB — SALICYLATE LEVEL: Salicylate Lvl: <4 mg/dL (ref 2.8–30.0)

## 2015-02-05 LAB — POC URINE PREG, ED: PREG TEST UR: NEGATIVE

## 2015-02-05 MED ORDER — IBUPROFEN 200 MG PO TABS: 600.0000 mg | ORAL_TABLET | Freq: Three times a day (TID) | ORAL | Status: DC | PRN

## 2015-02-05 MED ORDER — ACETAMINOPHEN 325 MG PO TABS: 650.0000 mg | ORAL_TABLET | ORAL | Status: DC | PRN

## 2015-02-05 NOTE — ED Notes (Signed)
[name]- group home contact info [phone]
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
## 2015-02-05 NOTE — ED Notes (Signed)
Pt. and belongings wanded by security 
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
## 2015-02-06 DIAGNOSIS — F251 Schizoaffective disorder, depressive type: Secondary | ICD-10-CM | POA: Diagnosis not present

## 2015-02-06 DIAGNOSIS — R451 Restlessness and agitation: Secondary | ICD-10-CM | POA: Insufficient documentation

## 2015-02-06 LAB — URINALYSIS, ROUTINE W REFLEX MICROSCOPIC
BILIRUBIN URINE: NEGATIVE
Glucose, UA: >1000 mg/dL — AB
Hgb urine dipstick: NEGATIVE
Ketones, ur: 15 mg/dL — AB
Leukocytes, UA: NEGATIVE
NITRITE: NEGATIVE
Protein, ur: NEGATIVE mg/dL
Specific Gravity, Urine: 1.027 (ref 1.005–1.030)
UROBILINOGEN UA: 0.2 mg/dL (ref 0.0–1.0)
pH: 5.5 (ref 5.0–8.0)

## 2015-02-06 LAB — URINE MICROSCOPIC-ADD ON

## 2015-02-06 LAB — VALPROIC ACID LEVEL: VALPROIC ACID LVL: 74 ug/mL (ref 50.0–100.0)

## 2015-02-06 LAB — CBG MONITORING, ED: Glucose-Capillary: 174 mg/dL — ABNORMAL HIGH (ref 65–99)

## 2015-02-06 NOTE — ED Notes (Signed)
TTS at bedside. 

## 2015-02-06 NOTE — ED Notes (Signed)
She has just ambulated to the bathroom at the end of the hall without difficulty.  She is healthy in appearance.
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
## 2015-02-06 NOTE — Progress Notes (Signed)
CSW spoke with Non-emergency operator who states that an order has put out for the police to pick up the pt. However, she states that they cannot verify what time it will be tonight.  According to Group Home Representative, the Address for the Group Home [address] [place], [place]
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
## 2015-02-06 NOTE — Progress Notes (Signed)
Officer Scott reached out to CSW and stated that a [employer] officer will pick the pt up sometime tonight. However, due to their schedules they are not sure of an exact time.  CSW made NP aware. CSW made Nurse aware.  Trish MageBrittney Rikki Smestad, LCSWA [phone] ED CSW 02/06/2015 6:00 PM

## 2015-02-06 NOTE — [facility] Suicide Risk Assessment (Cosign Needed)
Suicide Risk Assessment  Discharge Assessment   [facility]   Demographic Factors:  Adolescent or young adult, Low socioeconomic status and Unemployed  Total Time spent with patient: 20 minutes  Musculoskeletal: Strength & Muscle Tone: within normal limits Gait & Station: normal Patient leans: N/A  Psychiatric Specialty Exam:     Blood pressure 111/67, pulse 85, temperature 97.9 F (36.6 C), temperature source Oral, resp. rate 16, last menstrual period 09/19/2014, SpO2 98 %.There is no weight on file to calculate BMI.  General Appearance: Casual and Fairly Groomed  Eye Contact::  Good  Speech:  Clear and Coherent and Normal Rate409  Volume:  Normal  Mood:  Angry  Affect:  Congruent  Thought Process:  Coherent  Orientation:  Full (Time, Place, and Person)  Thought Content:  Hallucinations: Auditory  Suicidal Thoughts:  No  Homicidal Thoughts:  No  Memory:  Immediate;   Good Recent;   Good Remote;   Good  Judgement:  Fair  Insight:  Shallow  Psychomotor Activity:  Normal  Concentration:  Good  Recall:  NA  Fund of Knowledge:Fair  Language: Good  Akathisia:  NA  Handed:  Right  AIMS (if indicated):     Assets:  Desire for Improvement  Sleep:     Cognition: Impaired,  Moderate  ADL's:  Intact      Has this patient used any form of tobacco in the last 30 days? (Cigarettes, Smokeless Tobacco, Cigars, and/or Pipes) N/A  Mental Status Per Nursing Assessment::   On Admission:     Current Mental Status by Physician: NA  Loss Factors: NA  Historical Factors: NA  Risk Reduction Factors:   Living with another person, especially a relative and Positive social support  Continued Clinical Symptoms:  Schizophrenia:   Paranoid or undifferentiated type Previous Psychiatric Diagnoses and Treatments  Cognitive Features That Contribute To Risk:  Closed-mindedness and Polarized thinking    Suicide Risk:  Minimal: No identifiable suicidal  ideation.  Patients presenting with no risk factors but with morbid ruminations; may be classified as minimal risk based on the severity of the depressive symptoms  Principal Problem: <principal problem not specified> Discharge Diagnoses:  Patient Active Problem List   Diagnosis Date Noted  . 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 Schizoaffective disorder, depressive type [F25.1] 01/18/2015  . Type 2 diabetes mellitus without complication [E11.9]   . ALLERGIC RHINITIS [J30.9] 12/19/2009  . DYSPEPSIA [K30] 09/26/2009  . CONSTIPATION [K59.00] 09/26/2009  . TINEA PEDIS [B35.3] 12/17/2008  . HYPERLIPIDEMIA [E78.5] 01/08/2007  . Essential hypertension [I10] 01/08/2007  . Seizure disorder [G40.909] 01/08/2007      Plan Of Care/Follow-up recommendations:  Activity:  as tolerated Diet:  Regular  Is patient on multiple antipsychotic therapies at discharge:  No   Has Patient had three or more failed trials of antipsychotic monotherapy by history:  No  Recommended Plan for Multiple Antipsychotic Therapies: NA    Rosibel Giacobbe C   PMHNP-BC 02/06/2015, 4:27 PM

## 2015-02-06 NOTE — ED Notes (Signed)
Patient ambulatory to restroom with steady gait.

## 2015-02-06 NOTE — ED Notes (Signed)
Provided patient a ham sandwich and Sprite Zero.

## 2015-02-06 NOTE — ED Notes (Signed)
She is talkative and cheerful and is ambulatory without difficulty.  Our social worker has told me that [facility] transport will be here as soon as they are able to take pt. Back to her group home.
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
## 2015-02-06 NOTE — ED Notes (Signed)
A [employer] officer is here and pt. Signs her d/c papers and is d/c'd with police.  She is in good spirits.

## 2015-02-13 ENCOUNTER — Emergency Department (HOSPITAL_COMMUNITY)
Admission: EM | Admit: 2015-02-13 | Discharge: 2015-02-13 | Disposition: A | Discharge status: Home / Self Care | Payer: Medicare Other | Attending: Emergency Medicine | Admitting: Emergency Medicine

## 2015-02-13 ENCOUNTER — Encounter (HOSPITAL_COMMUNITY): Payer: Self-pay | Admitting: *Deleted

## 2015-02-13 DIAGNOSIS — R451 Restlessness and agitation: Secondary | ICD-10-CM

## 2015-02-13 DIAGNOSIS — I10 Essential (primary) hypertension: Secondary | ICD-10-CM | POA: Insufficient documentation

## 2015-02-13 DIAGNOSIS — R4689 Other symptoms and signs involving appearance and behavior: Secondary | ICD-10-CM

## 2015-02-13 DIAGNOSIS — Z794 Long term (current) use of insulin: Secondary | ICD-10-CM | POA: Insufficient documentation

## 2015-02-13 DIAGNOSIS — Z79899 Other long term (current) drug therapy: Secondary | ICD-10-CM | POA: Insufficient documentation

## 2015-02-13 DIAGNOSIS — E119 Type 2 diabetes mellitus without complications: Secondary | ICD-10-CM | POA: Insufficient documentation

## 2015-02-13 DIAGNOSIS — F209 Schizophrenia, unspecified: Secondary | ICD-10-CM | POA: Insufficient documentation

## 2015-02-13 DIAGNOSIS — G40909 Epilepsy, unspecified, not intractable, without status epilepticus: Secondary | ICD-10-CM | POA: Insufficient documentation

## 2015-02-13 DIAGNOSIS — F911 Conduct disorder, childhood-onset type: Secondary | ICD-10-CM | POA: Insufficient documentation

## 2015-02-13 DIAGNOSIS — Z3202 Encounter for pregnancy test, result negative: Secondary | ICD-10-CM | POA: Insufficient documentation

## 2015-02-13 LAB — COMPREHENSIVE METABOLIC PANEL
ALBUMIN: 3.3 g/dL — AB (ref 3.5–5.0)
ALK PHOS: 55 U/L (ref 38–126)
ALT: 16 U/L (ref 14–54)
AST: 22 U/L (ref 15–41)
Anion gap: 8 (ref 5–15)
BUN: 13 mg/dL (ref 6–20)
CO2: 23 mmol/L (ref 22–32)
CREATININE: 0.69 mg/dL (ref 0.44–1.00)
Calcium: 9.4 mg/dL (ref 8.9–10.3)
Chloride: 107 mmol/L (ref 101–111)
GFR calc Af Amer: >60 mL/min (ref >60)
GLUCOSE: 198 mg/dL — AB (ref 65–99)
POTASSIUM: 4.3 mmol/L (ref 3.5–5.1)
SODIUM: 138 mmol/L (ref 135–145)
TOTAL PROTEIN: 6.8 g/dL (ref 6.5–8.1)
Total Bilirubin: 0.3 mg/dL (ref 0.3–1.2)

## 2015-02-13 LAB — RAPID URINE DRUG SCREEN, HOSP PERFORMED
AMPHETAMINES: NOT DETECTED
BARBITURATES: NOT DETECTED
Benzodiazepines: NOT DETECTED
Cocaine: NOT DETECTED
Opiates: NOT DETECTED
Tetrahydrocannabinol: NOT DETECTED

## 2015-02-13 LAB — CBC
HEMATOCRIT: 38.3 % (ref 36.0–46.0)
Hemoglobin: 12.3 g/dL (ref 12.0–15.0)
MCH: 27.3 pg (ref 26.0–34.0)
MCHC: 32.1 g/dL (ref 30.0–36.0)
MCV: 85.1 fL (ref 78.0–100.0)
Platelets: 208 10*3/uL (ref 150–400)
RBC: 4.5 MIL/uL (ref 3.87–5.11)
RDW: 17.3 % — ABNORMAL HIGH (ref 11.5–15.5)
WBC: 9.9 10*3/uL (ref 4.0–10.5)

## 2015-02-13 LAB — ETHANOL

## 2015-02-13 LAB — POC URINE PREG, ED: PREG TEST UR: NEGATIVE

## 2015-02-13 LAB — SALICYLATE LEVEL: Salicylate Lvl: <4 mg/dL (ref 2.8–30.0)

## 2015-02-13 LAB — ACETAMINOPHEN LEVEL: Acetaminophen (Tylenol), Serum: <10 ug/mL — ABNORMAL LOW (ref 10–30)
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
## 2015-02-13 NOTE — Progress Notes (Addendum)
LCSW made contact with [facility]. They are aware that patient is in hospital and ready for DC/cleared by psych.  Group Home coming to pick patient up.  Group Home requesting for [employer] to come and pick patient up, however not an option.  Patient's group home contacted, no answer. Called the Guardian and she reports they are coming as she called them and it is their responsibility. [employer] will not transport.  Patient will be receiving a new Guardian:  [name]  [phone].  Deretha EmoryHannah Jessie Cowher LCSW, MSW Clinical Social Work: Emergency Room [phone]

## 2015-02-13 NOTE — Discharge Instructions (Signed)
°Emergency Department Resource Guide °1) Find a Doctor and Pay Out of Pocket °Although you won't have to find out who is covered by your insurance plan, it is a good idea to ask around and get recommendations. You will then need to call the office and see if the doctor you have chosen will accept you as a new patient and what types of options they offer for patients who are self-pay. Some doctors offer discounts or will set up payment plans for their patients who do not have insurance, but you will need to ask so you aren't surprised when you get to your appointment. ° °2) Contact Your Local Health Department °Not all health departments have doctors that can see patients for sick visits, but many do, so it is worth a call to see if yours does. If you don't know where your local health department is, you can check in your phone book. The CDC also has a tool to help you locate your state's health department, and many state websites also have listings of all of their local health departments. ° °3) Find a Walk-in Clinic °If your illness is not likely to be very severe or complicated, you may want to try a walk in clinic. These are popping up all over the country in pharmacies, drugstores, and shopping centers. They're usually staffed by nurse practitioners or physician assistants that have been trained to treat common illnesses and complaints. They're usually fairly quick and inexpensive. However, if you have serious medical issues or chronic medical problems, these are probably not your best option. ° °No Primary Care Doctor: °- Call Health Connect at  832-8000 - they can help you locate a primary care doctor that  accepts your insurance, provides certain services, etc. °- Physician Referral Service- 1-800-533-3463 ° °Chronic Pain Problems: °Organization         Address  Phone   Notes  °[facility]  (336) 297-2271 Patients need to be referred by their primary care doctor.  ° °Medication  Assistance: °Organization         Address  Phone   Notes  °[place] [facility] [address] °[place], [place] [address] (336) 641-8030 --Must be a resident of [place] °-- Must have NO insurance coverage whatsoever (no Medicaid/ Medicare, etc.) °-- The pt. MUST have a primary care doctor that directs their care regularly and follows them in the community °  °[facility]  (866) 331-1348   °[employer] Way  (888) 892-1162   ° °Agencies that provide inexpensive medical care: °Organization         Address  Phone   Notes  °[facility]  (336) 832-8035   °[facility]    (336) 832-7272   °[facility] [address] °[place], [place] [address] (336) 832-4777   °[facility] [address], °[place] (336) 271-4999   °[facility]    (336) 373-0678   °[facility]    (336) 272-1050   °[facility] ° [address], [place] Phone:  (336) 832-4444, Fax:  (336) 832-4440 Hours of Operation:  9 am - 6 pm, M-F.  Also accepts Medicaid/Medicare and self-pay.  °[facility] ° [address], [place] Phone: (336) 832-3150, Fax: (336) [phone]. Hours of Operation:  8:30 am - 5:30 pm, M-F.  Also accepts Medicaid and self-pay.  °[facility] [place] [address], [place] Phone: (336) 878-6027   °[facility] [address], [place], [place] (336)723-1848, Ext. 123 Mondays & Thursdays: 7-9 AM.  First 15 patients are seen on a first come, first serve basis. °  ° °Medicaid-accepting [place] Providers: ° °Organization         Address  Phone   Notes  °[facility] [address], [place] (336) 641-2100 Also accepts self-pay patients.  °[facility] [address], [place] ° (336) 856-9996   °[facility] [address], [place]  (336) 288-8857   °[facility] [address] [place] [address], [place] (336) 299-7000   °Veita Bland [address], [place]  ° (336) 373-1557 Only accepts [place] Access Medicaid patients after they have their name applied to their card.  ° °Self-Pay (no insurance) in [place]: ° °Organization         Address  Phone   Notes  °Sickle Cell Patients, [facility] [address], [place] (336) 832-1970   °[facility] [address], [place] (336) 832-4400   °[facility] ° [address], [place] (336) 992-4800   °[facility]/Dr. Osei-Bonsu ° [address] [place] [address], [place] or [address], [place] (336) 841-8500 Phone number for both [place] and [place] locations is the same.  °[facility] [address], [place] (336) 299-0000   °[facility] [address] [place] [address], [place] or [address] (336) 852-7530 °(336) 878-2260   °[facility] [address], [place] (336) 350-1642, phone; (336) 294-5005, fax Sees patients 1st and 3rd Saturday of every month.  Must not qualify for public or private insurance (i.e. Medicaid, Medicare, [place] Health Choice, Veterans' Benefits) • Household income should be no more than 200% of the poverty level •The clinic cannot treat you if you are pregnant or think you are pregnant • Sexually transmitted diseases are not treated at the clinic.  ° ° °Dental Care: °Organization         Address  Phone  Notes  °[place] [facility] [address], [place] (336) 641-6152 Accepts children up to age 21 who are enrolled in Medicaid or [place] Health Choice; pregnant women with a Medicaid card; and children who have applied for Medicaid or [place] Health Choice, but were declined, whose parents can pay a reduced fee at time of service.  °[place]  [facility] [place]  [address], [place] (336) 641-7733 Accepts children up to age 21 who are enrolled in Medicaid or [place] Health Choice; pregnant women with a Medicaid card; and children who have applied for Medicaid or [place] Health Choice, but were declined, whose parents can pay a reduced fee at time of service.  °[facility] ° [address], [place] (336) 641-4533 Patients are seen by appointment only. Walk-ins are not accepted. [facility] will see patients 18 years of age and older. °Monday - Tuesday (8am-5pm) °Most Wednesdays (8:30-5pm) °$30 per visit, cash only  °[facility] ° [address], [place] (336) 641-4533 Patients are seen by appointment only. Walk-ins are not accepted. [facility] will see patients 18 years of age and older. °One   Wednesday Evening (Monthly: Volunteer Based).  $30 per visit, cash only  °[facility]  (919) 537-3737 for adults; Children under age 4, call Graduate Pediatric Dentistry at (919) [phone]. Children aged 4-14, please call (919) [phone] to request a pediatric application. ° Dental services are provided in all areas of dental care including fillings, crowns and bridges, complete and partial dentures, implants, gum treatment, root canals, and extractions. Preventive care is also provided. Treatment is provided to both adults and children. °Patients are selected via a lottery and there is often a waiting list. °  °[facility] [address], °[place] ° (336) 763-8833 [email or web address] °  °[facility] [address], [place], [place] (336)723-1848, Ext. 123 Second and Fourth Thursday of each month, opens at 6:30 AM; Clinic ends at 9 AM.  Patients are seen on a first-come first-served basis, and a limited number are seen during each clinic.  ° °[facility] ° [address], [place], [place] (336) 723-7904    Eligibility Requirements °You must have lived in [place], [place], or [place] counties for at least the last three months. °  You cannot be eligible for state or federal sponsored healthcare insurance, including Veterans Administration, Medicaid, or Medicare. °  You generally cannot be eligible for healthcare insurance through your employer.  °  How to apply: °Eligibility screenings are held every Tuesday and Wednesday afternoon from 1:00 pm until 4:00 pm. You do not need an appointment for the interview!  °[facility] [address], [place], [place] 336-631-2330   °[facility]  336-342-8273   °[place]  336-703-3100   °[facility]  336-570-6415   ° °Behavioral Health Resources in the Community: °Intensive Outpatient Programs °Organization         Address  Phone  Notes  °[facility] [address], [place], [place] 336-878-6098   °[facility] [address], [place], [place] 336-832-9800   °[facility] [address], [place], [place] ° 336-882-2125   °[facility] [address],  °[place], [place] 1-800-853-5163 or 336-641-4981   °Substance Abuse Resources °Organization         Address  Phone  Notes  °[facility]  336-882-2125   °[facility]  336-784-9470   °[facility]  336-285-9073   °[facility]  336-845-3988   °Residential & Outpatient Substance Abuse Program  1-800-659-3381   °Psychological Services °Organization         Address  Phone  Notes  °[facility]  336- 832-9600   °[facility]  336- 378-7881   °[facility] [address], [place] 1-800-853-5163 or 336-641-4981   ° °Mobile Crisis Teams °Organization         Address  Phone  Notes  °[facility], Mobile Crisis Care Unit  1-877-626-1772   °Assertive °[facility] ° [address]  [place], [place] 336-834-9664   °Sharon DeEsch [address] °[place] 336-554-5454   ° °Self-Help/Support Groups °Organization         Address  Phone             Notes  °[facility]oc[facility] of  - variety of support groups  336- 373-1402 Call for more information  °Narcotics Anonymous (NA), [facility] [address], °[place]  2 meetings at this location  ° °  Residential Treatment Programs Organization         Address  Phone  Notes  [facility] [address],    [place]  [phone]   [facility]  [address], [place] [phone], [place], [place] [phone]   [facility] [address], [place] [phone]) [phone] Admissions: 8am-3pm M-F  [facility] [address]    [place], [place] [phone]   [facility] [address], [place], [place] [phone]   [facility] [address]  [place], [place] [phone]   [facility] Intensive Outpatient [address], [place], [place] [phone]   [facility] ([facility] [address]  [place], [place] [phone] or [phone]   [facility] [address] [place], [place] [phone] Accepts Medicaid  [facility] [address]  [place] [phone] Substance Abuse/Addiction Treatment   [facility] Organization         Address  Phone  Notes  [facility]  [phone](888) [phone]   Angie FavaJulie Brannon, PhD [address], [address] [place], [place]   [phone](336) [phone] or [phone](336) [phone]   [facility]   [address] [place], [place] [phone]) [phone]   [facility] [address], [place], [place] [phone](336) [phone] Insurance/Medicaid/sponsorship through [facility] [address]                                    [place], [place] [phone](336) [phone] Therapy/tele-psych/case    [facility] [address]   [place], [place] [phone]) [phone]    Dr. Lolly MustacheArfeen  [phone]) [phone]   [facility] [place]  [employer] [facility] 1) [address], [place] 2) [address], [place] 3)  [address], [place] [phone](336) [phone]) [phone]  [phone](336) [phone]   [facility] Child Abuse Hotline [phone](336) 5346901961 or [phone](336) [phone] (After Hours)      Take your usual prescriptions as previously directed.  Call your regular mental health provider today to schedule a follow up appointment within the next 3 days.  Return to the Emergency Department immediately sooner if worsening.
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
## 2015-02-13 NOTE — ED Notes (Signed)
TTS in process 
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
## 2015-02-13 NOTE — ED Notes (Signed)
Pt discharged to group home.  

## 2015-02-13 NOTE — ED Notes (Signed)
Attempted to call guardian Roxanne [phone] and [facility] at [phone] and at [phone] with no reply

## 2015-02-13 NOTE — Progress Notes (Signed)
Pt has been recommended for discharge to return to [facility] per psychiatry.   CSW attempted to contact group home at [phone] twice, left voicemail requested returned call as soon as possible. Also tried alternate number [phone], no answer and no voicemail option at that number.  TTS staff left voicemail for pt's gaurdian Roxann Brand at [phone].  Ilean SkillMeghan Abimbola Aki, MSW, LCSWA Clinical Social Work, Disposition  02/13/2015 [phone]

## 2015-02-13 NOTE — ED Notes (Signed)
Pt in paper scrubs at triage, called security to wand pt and requested a sitter.

## 2015-02-13 NOTE — ED Notes (Signed)
[facility]ahlia ClientHannah SW contacted for assistance for patient transportation.

## 2015-04-15 ENCOUNTER — Encounter (HOSPITAL_COMMUNITY): Payer: Self-pay

## 2015-04-15 ENCOUNTER — Emergency Department (HOSPITAL_COMMUNITY)
Admission: EM | Admit: 2015-04-15 | Discharge: 2015-04-15 | Disposition: A | Discharge status: Home / Self Care | Payer: Medicare Other | Attending: Emergency Medicine | Admitting: Emergency Medicine

## 2015-04-15 DIAGNOSIS — R7989 Other specified abnormal findings of blood chemistry: Secondary | ICD-10-CM | POA: Insufficient documentation

## 2015-04-15 DIAGNOSIS — F259 Schizoaffective disorder, unspecified: Secondary | ICD-10-CM | POA: Diagnosis not present

## 2015-04-15 DIAGNOSIS — Z794 Long term (current) use of insulin: Secondary | ICD-10-CM | POA: Insufficient documentation

## 2015-04-15 DIAGNOSIS — E119 Type 2 diabetes mellitus without complications: Secondary | ICD-10-CM | POA: Insufficient documentation

## 2015-04-15 DIAGNOSIS — Z0189 Encounter for other specified special examinations: Secondary | ICD-10-CM

## 2015-04-15 DIAGNOSIS — Z79899 Other long term (current) drug therapy: Secondary | ICD-10-CM | POA: Diagnosis not present

## 2015-04-15 DIAGNOSIS — I10 Essential (primary) hypertension: Secondary | ICD-10-CM | POA: Insufficient documentation

## 2015-04-15 DIAGNOSIS — Z008 Encounter for other general examination: Secondary | ICD-10-CM | POA: Diagnosis present

## 2015-04-15 LAB — BASIC METABOLIC PANEL
Anion gap: 12 (ref 5–15)
BUN: 11 mg/dL (ref 6–20)
CHLORIDE: 105 mmol/L (ref 101–111)
CO2: 23 mmol/L (ref 22–32)
Calcium: 9.3 mg/dL (ref 8.9–10.3)
Creatinine, Ser: 0.61 mg/dL (ref 0.44–1.00)
GFR calc Af Amer: >60 mL/min (ref >60)
Glucose, Bld: 236 mg/dL — ABNORMAL HIGH (ref 65–99)
Potassium: 4.9 mmol/L (ref 3.5–5.1)
SODIUM: 140 mmol/L (ref 135–145)

## 2015-04-15 LAB — CBC WITH DIFFERENTIAL/PLATELET
BASOS ABS: 0 10*3/uL (ref 0.0–0.1)
BASOS PCT: 0 % (ref 0–1)
Eosinophils Absolute: 0.3 10*3/uL (ref 0.0–0.7)
Eosinophils Relative: 2 % (ref 0–5)
HEMATOCRIT: 44.2 % (ref 36.0–46.0)
Hemoglobin: 13.9 g/dL (ref 12.0–15.0)
LYMPHS PCT: 36 % (ref 12–46)
Lymphs Abs: 4.8 10*3/uL — ABNORMAL HIGH (ref 0.7–4.0)
MCH: 27.7 pg (ref 26.0–34.0)
MCHC: 31.4 g/dL (ref 30.0–36.0)
MCV: 88.2 fL (ref 78.0–100.0)
MONOS PCT: 6 % (ref 3–12)
Monocytes Absolute: 0.7 10*3/uL (ref 0.1–1.0)
NEUTROS ABS: 7.4 10*3/uL (ref 1.7–7.7)
Neutrophils Relative %: 56 % (ref 43–77)
Platelets: 208 10*3/uL (ref 150–400)
RBC: 5.01 MIL/uL (ref 3.87–5.11)
RDW: 17.8 % — AB (ref 11.5–15.5)
WBC: 13.2 10*3/uL — ABNORMAL HIGH (ref 4.0–10.5)

## 2015-04-15 LAB — VALPROIC ACID LEVEL: Valproic Acid Lvl: 69 ug/mL (ref 50.0–100.0)

## 2015-04-15 NOTE — ED Notes (Signed)
Pt presents with c/o medical clearance and lab work. Pt has a bed at [facility] and will be returning once the results are back. Pt is calm and cooperative at this time. Pt is under IVC papers, Marketing executive at bedside.

## 2015-04-15 NOTE — ED Notes (Signed)
Paperwork from [place] reported that pt only needs a CBC with differential for clearance back to [facility].

## 2015-04-15 NOTE — ED Provider Notes (Signed)
CSN: [id number]     Arrival date & time 04/15/15  1836 History   First MD Initiated Contact with Patient 04/15/15 1930     Chief Complaint  Patient presents with  . Medical Clearance     (Consider location/radiation/quality/duration/timing/severity/associated sxs/prior Treatment) HPI Reportedly the patient is sent over for blood work. Apparently she is sent for monitoring of the CBC due to being on Clozaril. After the nurse called the facility for inquiry as to the concerns and reason for sending the patient to the emergency department, they also requested a Depakote level. From the nursing staff there is no report of other concerns or reasons for signs of patient's emergency department. The patient denies she's having any problems right now. She does speak somewhat about the "Wolves making her do things". She is pleasant and cooperative at this time. On review of systems she did endorse some burning with urination but reports that they are rechecked her urine and she did not have any infection. She did not endorse any other positives on review of systems Past Medical History  Diagnosis Date  . Mild mental retardation   . Excessive anger   . Schizophrenia   . Hallucinations   . Diabetes mellitus without complication   . Hypertension   . Seizures    History reviewed. No pertinent past surgical history. Family History  Problem Relation Age of Onset  . Diabetes Mellitus II Mother   . Cancer Father    History  Substance Use Topics  . Smoking status: Never Smoker   . Smokeless tobacco: Not on file  . Alcohol Use: No   OB History    No data available     Review of Systems  10 Systems reviewed and are negative for acute change except as noted in the HPI.   Allergies  Haloperidol lactate and Shrimp  Home Medications   Prior to Admission medications   Medication Sig Start Date End Date Taking? Authorizing Provider  atenolol (TENORMIN) 50 MG tablet Take 1 tablet (50 mg total) by  mouth daily. 01/25/15   Hildred Priest, MD  atorvastatin (LIPITOR) 80 MG tablet Take 1 tablet (80 mg total) by mouth at bedtime. 01/25/15   Hildred Priest, MD  Blood Glucose Monitoring Suppl (ONE TOUCH ULTRA 2) W/DEVICE KIT Monitor blood sugars at least 2 times daily. 12/04/14   Katheren Shams, DO  canagliflozin (INVOKANA) 100 MG TABS tablet Take 100 mg by mouth daily.    Historical Provider, MD  clozapine (CLOZARIL) 200 MG tablet Take 1 tablet (200 mg total) by mouth at bedtime. 01/25/15   Hildred Priest, MD  clozapine (CLOZARIL) 50 MG tablet Take 1 tablet (50 mg total) by mouth daily. 01/25/15   Hildred Priest, MD  divalproex (DEPAKOTE) 500 MG DR tablet Take 1 tablet (500 mg total) by mouth 3 (three) times daily. 01/25/15   Hildred Priest, MD  docusate sodium (COLACE) 100 MG capsule Take 2 capsules (200 mg total) by mouth 2 (two) times daily. 01/25/15   Hildred Priest, MD  insulin aspart (NOVOLOG) 100 UNIT/ML FlexPen Inject 2-10 Units into the skin 3 (three) times daily as needed for high blood sugar (high blood sugar).    Historical Provider, MD  ipratropium (ATROVENT) 0.03 % nasal spray Place 1 spray into the nose 2 (two) times daily. 01/25/15   Hildred Priest, MD  lisinopril (PRINIVIL,ZESTRIL) 2.5 MG tablet Take 1 tablet (2.5 mg total) by mouth daily. 01/25/15   Hildred Priest, MD  medroxyPROGESTERone (DEPO-PROVERA) 150  MG/ML injection Inject 150 mg into the muscle every 3 (three) months.    Historical Provider, MD  metFORMIN (GLUCOPHAGE) 1000 MG tablet Take 1 tablet (1,000 mg total) by mouth 2 (two) times daily with a meal. 01/25/15   Hildred Priest, MD  pantoprazole (PROTONIX) 40 MG tablet Take 1 tablet (40 mg total) by mouth daily. 01/25/15   Hildred Priest, MD  polyethylene glycol (MIRALAX / Floria Raveling) packet Take 17 g by mouth daily as needed for moderate constipation. 01/25/15   Hildred Priest, MD  selenium sulfide (SELSUN) 2.5 % shampoo Apply 1 application topically daily as needed for irritation. 01/25/15   Hildred Priest, MD  senna (SENOKOT) 8.6 MG TABS tablet Take 2 tablets (17.2 mg total) by mouth at bedtime. 01/25/15   Hildred Priest, MD   BP 126/82 mmHg  Pulse 87  Temp(Src) 98.3 F (36.8 C) (Oral)  Resp 16  SpO2 99%  LMP  (LMP Unknown) Physical Exam  Constitutional: She appears well-developed and well-nourished.  Moderately obese. Alert and nontoxic. No restaurant stress. Color is good  HENT:  Head: Normocephalic and atraumatic.  Eyes: EOM are normal. Pupils are equal, round, and reactive to light.  Neck: Neck supple.  Cardiovascular: Normal rate, regular rhythm, normal heart sounds and intact distal pulses.   Pulmonary/Chest: Effort normal and breath sounds normal.  Abdominal: Soft. Bowel sounds are normal. She exhibits no distension. There is no tenderness.  Musculoskeletal: Normal range of motion. She exhibits no edema.  Neurological: She is alert. She has normal strength. She exhibits normal muscle tone. Coordination normal. GCS eye subscore is 4. GCS verbal subscore is 5. GCS motor subscore is 6.  Skin: Skin is warm, dry and intact.  No skin rashes or areas of cellulitis.  Psychiatric: She has a normal mood and affect.    ED Course  Procedures (including critical care time) Labs Review Labs Reviewed  CBC WITH DIFFERENTIAL/PLATELET - Abnormal; Notable for the following:    WBC 13.2 (*)    RDW 17.8 (*)    Lymphs Abs 4.8 (*)    All other components within normal limits  BASIC METABOLIC PANEL - Abnormal; Notable for the following:    Glucose, Bld 236 (*)    All other components within normal limits  VALPROIC ACID LEVEL    Imaging Review No results found.   EKG Interpretation None      MDM   Final diagnoses:  Laboratory test  Schizoaffective disorder, unspecified type   Patient's diagnostic results do  not indicate immediate abnormality. Patient is return to [facility] in stable condition.    Charlesetta Shanks, MD 04/15/15 [phone]

## 2017-04-08 ENCOUNTER — Encounter: Payer: Self-pay | Admitting: Gastroenterology

## 2017-04-08 ENCOUNTER — Telehealth: Payer: Self-pay | Admitting: Gastroenterology

## 2017-04-08 NOTE — Telephone Encounter (Signed)
A user error has taken place.

## 2017-04-16 ENCOUNTER — Ambulatory Visit: Payer: Self-pay | Admitting: Gastroenterology

## 2017-04-23 ENCOUNTER — Ambulatory Visit: Payer: Self-pay | Admitting: Gastroenterology

## 2017-05-04 ENCOUNTER — Telehealth: Payer: Self-pay | Admitting: Gastroenterology

## 2017-05-04 NOTE — Telephone Encounter (Signed)
Per Cheri she will get with the pt, and whoever took her to her appointment to find out what type of procedure she had and if she had one.

## 2017-05-04 NOTE — Telephone Encounter (Signed)
Called [employer] (caseworker at [employer]) to ask about gi hx with pt. Dottie noticed on referral it says referral for polyps. If pt had colon when and where was it? Pt ov sch for 10.23.18

## 2017-06-29 ENCOUNTER — Ambulatory Visit (INDEPENDENT_AMBULATORY_CARE_PROVIDER_SITE_OTHER): Payer: Medicare Other | Admitting: Gastroenterology

## 2017-06-29 ENCOUNTER — Encounter (INDEPENDENT_AMBULATORY_CARE_PROVIDER_SITE_OTHER): Payer: Self-pay

## 2017-06-29 VITALS — BP 130/70 | HR 82 | Ht 62.5 in | Wt 162.0 lb

## 2017-06-29 DIAGNOSIS — R194 Change in bowel habit: Secondary | ICD-10-CM

## 2017-06-29 MED ORDER — DICYCLOMINE HCL 10 MG PO CAPS: 10.0000 mg | ORAL_CAPSULE | Freq: Three times a day (TID) | ORAL | 1 refills | Status: DC | PRN

## 2017-06-29 MED ORDER — POLYETHYLENE GLYCOL 3350 17 G PO PACK: 17.0000 g | PACK | Freq: Two times a day (BID) | ORAL | 1 refills | Status: AC

## 2017-06-29 NOTE — Progress Notes (Signed)
HPI :  36 y/o female with a history of schizophrenia, seizure disorder, DM, presenting with change in bowel habits referred by Dr. Lorelei Pont.   She is accompanied by her guardians today with whom she has lived since May. They report some change in her bowel habits since this time. She has explosive diarrhea which occurs about every 2 weeks. They think prior to this occurring she will not have a bowel movement for 2-3 days and has baseline constipation. Prior xray done on 11/09/2016 showed "impacted stool" and gas. She had been on miralax multiple times per day but had reduced it to one daily in recent months. She endorses one episode of blood in the stool in May, since that time has not had any recurrence. She thinks this bleeding may have occurred in the setting of constipation. She endorses periodic lower abdominal cramps, with rare nausea. No weight loss. She is eating okay. Taking peptobismol PRN. She denies any heartburn or regurgitation. No vomiting. She has never had a prior colonoscopy. No known FH of colon cancer.   She was reported to have a "polyp" or abnormality palpated during DRE on 04/06/17 by another provider.     Past Medical History:  Diagnosis Date  . Diabetes mellitus without complication ([place])   . Excessive anger   . GERD (gastroesophageal reflux disease)   . Hallucinations   . Hyperlipidemia   . Hypertension   . Mild mental retardation   . Schizophrenia ([place])   . Seizures ([place])   . Vitamin D deficiency      No past surgical history on file. Family History  Problem Relation Age of Onset  . Diabetes Mellitus II Mother   . Cancer Father    Social History  Substance Use Topics  . Smoking status: Never Smoker  . Smokeless tobacco: Not on file  . Alcohol use No   Current Outpatient Prescriptions  Medication Sig Dispense Refill  . atenolol (TENORMIN) 50 MG tablet Take 1 tablet (50 mg total) by mouth daily. 2 tablet 0  . atorvastatin (LIPITOR) 80 MG  tablet Take 1 tablet (80 mg total) by mouth at bedtime. 2 tablet 0  . Blood Glucose Monitoring Suppl (ONE TOUCH ULTRA 2) W/DEVICE KIT Monitor blood sugars at least 2 times daily. 1 each 0  . CALCIUM PO Take 1 tablet by mouth daily. 445m tablets    . cetirizine (ZYRTEC) 10 MG tablet Take 10 mg by mouth at bedtime.    . clotrimazole-betamethasone (LOTRISONE) cream Apply 1 application topically 2 (two) times daily.    . clozapine (CLOZARIL) 200 MG tablet Take 200 mg by mouth 2 (two) times daily.    . divalproex (DEPAKOTE) 500 MG DR tablet Take 1 tablet (500 mg total) by mouth 3 (three) times daily. 6 tablet 0  . docusate sodium (COLACE) 100 MG capsule Take 2 capsules (200 mg total) by mouth 2 (two) times daily. 6 capsule 0  . EPINEPHrine 0.3 mg/0.3 mL IJ SOAJ injection Inject 0.3 mg into the muscle as directed.    .Marland Kitchenlisinopril (PRINIVIL,ZESTRIL) 2.5 MG tablet Take 1 tablet (2.5 mg total) by mouth daily. 2 tablet 0  . medroxyPROGESTERone (DEPO-PROVERA) 150 MG/ML injection Inject 150 mg into the muscle every 3 (three) months.    . metFORMIN (GLUCOPHAGE) 500 MG tablet Take 500 mg by mouth 2 (two) times daily with a meal.    . omeprazole (PRILOSEC) 20 MG capsule Take 20 mg by mouth daily.    . polyethylene  glycol (MIRALAX / GLYCOLAX) packet Take 17 g by mouth daily as needed for moderate constipation. 14 each 0  . sodium fluoride (DENTA 5000 PLUS) 1.1 % CREA dental cream Place 1 application onto teeth every evening.    . Vitamin D, Ergocalciferol, (DRISDOL) 50000 units CAPS capsule Take 50,000 Units by mouth 2 (two) times a week.     No current facility-administered medications for this visit.    Allergies  Allergen Reactions  . Haloperidol Lactate Other (See Comments)    unk  . Other Rash    Strawberries  . Shrimp [Shellfish Allergy] Hives and Rash     Review of Systems: All systems reviewed and negative except where noted in HPI.    Labs from primary care: Hgb 11.8, MCV 87   Physical  Exam: BP 130/70   Pulse 82   Ht 5' 2.5" (1.588 m)   Wt 162 lb (73.5 kg)   LMP 06/23/2017 (Exact Date)   BMI 29.16 kg/m  Constitutional: Pleasant, female in no acute distress. HEENT: Normocephalic and atraumatic. Conjunctivae are normal. No scleral icterus. Neck supple.  Cardiovascular: Normal rate, regular rhythm.  Pulmonary/chest: Effort normal and breath sounds normal. No wheezing, rales or rhonchi. Abdominal: Soft, nondistended, nontender. There are no masses palpable.  DRE - normal external exam, no nodule / fissure, normal DRE, no mass lesions or palpable abnormalities on DRE Extremities: no edema Lymphadenopathy: No cervical adenopathy noted. Neurological: Alert and oriented to person place and time. Responds to most questions although assistance with prompting responses from guardians Skin: Skin is warm and dry. No rashes noted.    ASSESSMENT AND PLAN: 36 y/o female with history of shizophrenia and developmental delay, presenting with baseline constipation and intermittent episodes of explosive diarrhea a few times per month as above. Prior xray showed concern for impaction / severe constipation. She also has intermittent lower abdominal cramps. Difficult to obtain history from patient, her guardians helped in this regard.   I think she may be having baseline constipation with overflow diarrhea / incontinence, and explained to them what this was. Recommend she increase her miralax to twice daily and titrate as needed to goal of a few BMs per day. If this causes worsening diarrhea they can titrate down and call me back for advice. I will also provide bentyl 65m q 8 hrs to use as needed for cramps.   In regards to her one episode of rectal bleeding, suspect this could have been due to fissure or hemorrhoid in the setting of constipation. I obtained labs from primary care showing no anemia. Her DRE in clinic today was normal, I did not appreciate any polyps or mass lesion. We  discussed that it would be very challenging for this patient to drink a bowel preparation and perform a colonoscopy, we discussed what this would entail and they agreed. The patient wanted to hold off on this if possible. We will await her course, if she has recurrent or persistent rectal bleeding I think we would need to discuss colonoscopy again with her, or if her symptoms fail to improve. They can call with an update in a few weeks if no improvement.   SCarolina Cellar MD [facility] Pager [phone] CC: [place], RRalene Bathe MD

## 2017-06-29 NOTE — Patient Instructions (Signed)
If you are age 35 or older, your body mass index should be between 23-30. Your Body mass index is 29.16 kg/m. If this is out of the aforementioned range listed, please consider follow up with your Primary Care Provider.  If you are age 35 or younger, your body mass index should be between 19-25. Your Body mass index is 29.16 kg/m. If this is out of the aformentioned range listed, please consider follow up with your Primary Care Provider.    We have sent the following medications to your pharmacy for you to pick up at your convenience:  Bentyl, 10 mg: take every 8 hours as needed Miralax: Increase to twice a day   Thank you.

## 2017-06-30 ENCOUNTER — Telehealth: Payer: Self-pay

## 2017-06-30 NOTE — Telephone Encounter (Signed)
[facility].  Gave her findings and recommendations. Reviewed recommendations for increased Miralax and Bentyl.  Instructed her to call us back if pt does not improve in the next couple of weeks.

## 2017-06-30 NOTE — Telephone Encounter (Signed)
-----   Message from Benancio DeedsSteven P Armbruster, MD sent at 06/29/2017  5:44 PM EDT ----- Regarding: follow up Alexandria Harvey can you call this patient's guardians and let them know I have reviewed Alexandria Harvey's labs. All looks okay to me.  We will continue the plan of higher dosing of Miralax and bentyl as needed. If she doesn't get better over the next few weeks they can call back for further advice. Thanks

## 2017-09-11 ENCOUNTER — Other Ambulatory Visit: Payer: Self-pay | Admitting: Gastroenterology

## 2017-09-21 ENCOUNTER — Encounter (HOSPITAL_COMMUNITY): Payer: Self-pay | Admitting: Emergency Medicine

## 2017-09-21 ENCOUNTER — Ambulatory Visit (HOSPITAL_COMMUNITY)
Admission: EM | Admit: 2017-09-21 | Discharge: 2017-09-21 | Disposition: A | Discharge status: Home / Self Care | Payer: Medicare Other

## 2017-09-21 ENCOUNTER — Other Ambulatory Visit: Payer: Self-pay

## 2017-09-21 NOTE — ED Notes (Signed)
Unable to urinate, provided water.

## 2017-09-21 NOTE — ED Triage Notes (Signed)
Vaginal discharge on Tuesday last week.  Pain with urination

## 2017-09-21 NOTE — ED Notes (Signed)
Sent to bathroom with caregiver

## 2017-09-21 NOTE — ED Notes (Signed)
Caregiver called patient to the room  Patient is having a behavioral outburst.  Caregiver is restraining patient with a "bear-hug" and speaking calmly to patient.  Patient is scratching at caregivers hand.  Room is disheveled: blankets and hat in floor, belongings strewn about room.  Notified provider of patient behavior and delay in obtaining urine.  Caregiver denied needing any assistance.  Caregiver called associate to help with patient  Caregiver walked with patient out of treatment room.  York SpanielSaid they would return tomorrow.  Caregiver said " she doesn't trust herself right now (patient)."  Notified providers, patient and caregiver had left treatment area.  Caregiver said maybe tomorrow would be a better day for patient and could be seen.

## 2019-02-07 ENCOUNTER — Emergency Department (HOSPITAL_COMMUNITY): Payer: Medicare Other

## 2019-02-07 ENCOUNTER — Other Ambulatory Visit: Payer: Self-pay

## 2019-02-07 ENCOUNTER — Emergency Department (HOSPITAL_COMMUNITY)
Admission: EM | Admit: 2019-02-07 | Discharge: 2019-02-07 | Disposition: A | Discharge status: Home / Self Care | Payer: Medicare Other | Attending: Emergency Medicine | Admitting: Emergency Medicine

## 2019-02-07 DIAGNOSIS — Z79899 Other long term (current) drug therapy: Secondary | ICD-10-CM | POA: Diagnosis not present

## 2019-02-07 DIAGNOSIS — Y9389 Activity, other specified: Secondary | ICD-10-CM | POA: Insufficient documentation

## 2019-02-07 DIAGNOSIS — Z7984 Long term (current) use of oral hypoglycemic drugs: Secondary | ICD-10-CM | POA: Diagnosis not present

## 2019-02-07 DIAGNOSIS — T07XXXA Unspecified multiple injuries, initial encounter: Secondary | ICD-10-CM

## 2019-02-07 DIAGNOSIS — Y999 Unspecified external cause status: Secondary | ICD-10-CM | POA: Diagnosis not present

## 2019-02-07 DIAGNOSIS — F70 Mild intellectual disabilities: Secondary | ICD-10-CM | POA: Insufficient documentation

## 2019-02-07 DIAGNOSIS — S0083XA Contusion of other part of head, initial encounter: Secondary | ICD-10-CM | POA: Diagnosis not present

## 2019-02-07 DIAGNOSIS — W108XXA Fall (on) (from) other stairs and steps, initial encounter: Secondary | ICD-10-CM | POA: Diagnosis not present

## 2019-02-07 DIAGNOSIS — I10 Essential (primary) hypertension: Secondary | ICD-10-CM | POA: Diagnosis not present

## 2019-02-07 DIAGNOSIS — S2232XA Fracture of one rib, left side, initial encounter for closed fracture: Secondary | ICD-10-CM | POA: Insufficient documentation

## 2019-02-07 DIAGNOSIS — E119 Type 2 diabetes mellitus without complications: Secondary | ICD-10-CM | POA: Insufficient documentation

## 2019-02-07 DIAGNOSIS — S299XXA Unspecified injury of thorax, initial encounter: Secondary | ICD-10-CM | POA: Diagnosis present

## 2019-02-07 DIAGNOSIS — Y929 Unspecified place or not applicable: Secondary | ICD-10-CM | POA: Insufficient documentation

## 2019-02-07 DIAGNOSIS — W19XXXA Unspecified fall, initial encounter: Secondary | ICD-10-CM

## 2019-02-07 DIAGNOSIS — R51 Headache: Secondary | ICD-10-CM | POA: Diagnosis not present

## 2019-02-07 LAB — CBG MONITORING, ED: Glucose-Capillary: 134 mg/dL — ABNORMAL HIGH (ref 70–99)

## 2019-02-07 MED ORDER — HYDROCODONE-ACETAMINOPHEN 5-325 MG PO TABS: 1.0000 | ORAL_TABLET | Freq: Four times a day (QID) | ORAL | 0 refills | Status: DC | PRN

## 2019-02-07 NOTE — ED Provider Notes (Signed)
[facility] EMERGENCY DEPARTMENT Provider Note   CSN: [id number] Arrival date & time: 02/07/19  1151   History   Chief Complaint Chief Complaint  Patient presents with   Fall    HPI Alexandria Harvey is a 38 y.o. female.     HPI   38 year old female presents status post fall.  She is accompanied by her caretaker.  Patient notes she was walking down the stairs with a washcloth when she stumbled and fell.  The caretaker notes she fell down several stairs landing upside down and twisted.  They note bruising to the back left forearm, left forehead.  The patient notes pain to her back, left ribs, left forehead.  She denies any neurological deficits, denies any abdominal or pelvic pain.  No medications prior to arrival.  Caretaker notes that the patient has a history of MR and often walks with her eyes closed, she has been acting appropriate since the fall.   Past Medical History:  Diagnosis Date   Diabetes mellitus without complication (HCC)    Excessive anger    GERD (gastroesophageal reflux disease)    Hallucinations    Hyperlipidemia    Hypertension    Mild mental retardation    Schizophrenia ([place])    Seizures ([place])    Vitamin D deficiency     Patient Active Problem List   Diagnosis Date Noted   Agitation    Moderate intellectual disabilities 01/25/2015   Schizoaffective disorder, depressive type ([place]) 01/18/2015   Type 2 diabetes mellitus without complication ([place])    ALLERGIC RHINITIS 12/19/2009   DYSPEPSIA 09/26/2009   CONSTIPATION 09/26/2009   TINEA PEDIS 12/17/2008   HYPERLIPIDEMIA 01/08/2007   Essential hypertension 01/08/2007   Seizure disorder ([place]) 01/08/2007    No past surgical history on file.   OB History   No obstetric history on file.     Home Medications    Prior to Admission medications   Medication Sig Start Date End Date Taking? Authorizing Provider  atenolol (TENORMIN) 50 MG tablet Take 1 tablet (50  mg total) by mouth daily. 01/25/15   Hildred Priest, MD  atorvastatin (LIPITOR) 80 MG tablet Take 1 tablet (80 mg total) by mouth at bedtime. 01/25/15   Hildred Priest, MD  benztropine (COGENTIN) 0.5 MG tablet Take 0.5 mg by mouth 2 (two) times daily.    [provider]  Blood Glucose Monitoring Suppl (ONE TOUCH ULTRA 2) W/DEVICE KIT Monitor blood sugars at least 2 times daily. 12/04/14   Katheren Shams, DO  CALCIUM PO Take 1 tablet by mouth daily. '400mg'$  tablets    [provider]  cetirizine (ZYRTEC) 10 MG tablet Take 10 mg by mouth at bedtime.    [provider]  clotrimazole-betamethasone (LOTRISONE) cream Apply 1 application topically 2 (two) times daily.    [provider]  clozapine (CLOZARIL) 200 MG tablet Take 200 mg by mouth 2 (two) times daily.    [provider]  dicyclomine (BENTYL) 10 MG capsule Take 1 capsule (10 mg total) by mouth every 8 (eight) hours as needed for spasms. 06/29/17   Armbruster, Carlota Raspberry, MD  divalproex (DEPAKOTE) 500 MG DR tablet Take 1 tablet (500 mg total) by mouth 3 (three) times daily. 01/25/15   Hildred Priest, MD  docusate sodium (COLACE) 100 MG capsule Take 2 capsules (200 mg total) by mouth 2 (two) times daily. 01/25/15   Hildred Priest, MD  EPINEPHrine 0.3 mg/0.3 mL IJ SOAJ injection Inject 0.3 mg into  the muscle as directed.    [provider]  HYDROcodone-acetaminophen (NORCO/VICODIN) 5-325 MG tablet Take 1 tablet by mouth every 6 (six) hours as needed. 02/07/19   Maricela Schreur, Dellis Filbert, PA-C  lisinopril (PRINIVIL,ZESTRIL) 2.5 MG tablet Take 1 tablet (2.5 mg total) by mouth daily. 01/25/15   Hildred Priest, MD  medroxyPROGESTERone (DEPO-PROVERA) 150 MG/ML injection Inject 150 mg into the muscle every 3 (three) months.    [provider]  metFORMIN (GLUCOPHAGE) 500 MG tablet Take 500 mg by mouth 2 (two) times daily with a meal.    [provider]  omeprazole (PRILOSEC) 20 MG capsule Take 20 mg by mouth daily.    [provider]  polyethylene glycol (MIRALAX / GLYCOLAX) packet Take 17 g by mouth 2 (two) times daily. 06/29/17   Armbruster, Carlota Raspberry, MD  polyethylene glycol (MIRALAX / GLYCOLAX) packet DISSOLVE 1 PACKET (17 GRAMS) IN 8OZ LIQUID OF CHOICE AND TAKE BY MOUTH TWICE A DAY 09/13/17   Armbruster, Carlota Raspberry, MD  sodium fluoride (DENTA 5000 PLUS) 1.1 % CREA dental cream Place 1 application onto teeth every evening.    [provider]  Vitamin D, Ergocalciferol, (DRISDOL) 50000 units CAPS capsule Take 50,000 Units by mouth 2 (two) times a week.    [provider]    Family History Family History  Problem Relation Age of Onset   Diabetes Mellitus II Mother    Cancer Father     Social History Social History   Tobacco Use   Smoking status: Never Smoker  Substance Use Topics   Alcohol use: No   Drug use: No    Allergies   Haloperidol lactate; Other; and Shrimp [shellfish allergy]   Review of Systems Review of Systems  All other systems reviewed and are negative.   Physical Exam Updated Vital Signs BP 112/69 (BP Location: Left Arm)    Pulse 89    Temp 98 F (36.7 C) (Oral)    Resp 14    SpO2 100%   Physical Exam Vitals signs and nursing note reviewed.  Constitutional:      Appearance: She is well-developed.  HENT:     Head: Normocephalic and atraumatic.     Comments: Hematoma noted to the left forehead Eyes:     General: No scleral icterus.       Right eye: No discharge.        Left eye: No discharge.     Conjunctiva/sclera: Conjunctivae normal.     Pupils: Pupils are equal, round, and reactive to light.  Neck:     Musculoskeletal: Normal range of motion.     Vascular: No JVD.     Trachea: No tracheal deviation.  Pulmonary:     Effort: Pulmonary effort is normal.     Breath sounds: No stridor.  Musculoskeletal:     Comments: Tenderness palpation of the left  lateral cervical musculature, no midline cervical spine palpation, tenderness palpation of the mid thoracic region diffusely with small amount of early bruising, tenderness palpation of the lumbar left lateral soft tissue-left lateral ribs with minimal tenderness, lung expansion normal, lung sounds clear  Hips are stable with AP and lateral compression and nontender full active range of motion although reduced (patient norm) without pain-lower extremities nontender to palpation  Neurological:     General: No focal deficit present.     Mental Status: She is alert.     Cranial Nerves: No cranial nerve deficit.     Sensory: No sensory deficit.  Coordination: Coordination normal.  Psychiatric:        Behavior: Behavior normal.        Thought Content: Thought content normal.        Judgment: Judgment normal.      ED Treatments / Results  Labs (all labs ordered are listed, but only abnormal results are displayed) Labs Reviewed  CBG MONITORING, ED - Abnormal; Notable for the following components:      Result Value   Glucose-Capillary 134 (*)    All other components within normal limits    EKG None  Radiology Dg Ribs Unilateral W/chest Left  Result Date: 02/07/2019 CLINICAL DATA:  Left back pain and abrasions following a fall today and 3 weeks ago. EXAM: LEFT RIBS AND CHEST - 3+ VIEW COMPARISON:  Chest CTA dated 12/03/2014. FINDINGS: Old, healed fractures of the left 9th, 10th, 11th and 12th posterolateral ribs with a possible acute component involving the left 9th rib. No pneumothorax. Clear lungs. Normal sized heart. Thoracic spine degenerative changes. IMPRESSION: Possible acute left 9th rib fracture as well as old, healed left 9th through 12th rib fractures. Electronically Signed   By: Claudie Revering M.D.   On: 02/07/2019 16:05   Dg Thoracic Spine 2 View  Result Date: 02/07/2019 CLINICAL DATA:  Back pain and abrasions following a fall today and 3 weeks ago. EXAM: THORACIC SPINE 2  VIEWS COMPARISON:  Chest CTA dated 12/03/2014. FINDINGS: Mild multilevel degenerative changes. No fracture or subluxation. IMPRESSION: No fracture or subluxation. Mild multilevel degenerative changes. Electronically Signed   By: Claudie Revering M.D.   On: 02/07/2019 15:57   Dg Lumbar Spine Complete  Result Date: 02/07/2019 CLINICAL DATA:  Back pain and abrasions following a fall today and 3 weeks ago. EXAM: LUMBAR SPINE - COMPLETE 4+ VIEW COMPARISON:  Abdomen and pelvis CT dated 08/31/2009. 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 No acute fractures or subluxations. IMPRESSION: 1. No acute fracture or subluxation. 2. Stable bilateral L5 spondylolysis with associated grade 1 spondylolisthesis at the L5-S1 level. Electronically Signed   By: Claudie Revering M.D.   On: 02/07/2019 15:59   Ct Head Wo Contrast  Result Date: 02/07/2019 CLINICAL DATA:  Status post fall, lump on the left forehead EXAM: CT HEAD WITHOUT CONTRAST CT CERVICAL SPINE WITHOUT CONTRAST TECHNIQUE: Multidetector CT imaging of the head and cervical spine was performed following the standard protocol without intravenous contrast. Multiplanar CT image reconstructions of the cervical spine were also generated. COMPARISON:  None. FINDINGS: CT HEAD FINDINGS Brain: No evidence of acute infarction, hemorrhage, hydrocephalus, extra-axial collection or mass lesion/mass effect. Vascular: No hyperdense vessel or unexpected calcification. Skull: No osseous abnormality. Sinuses/Orbits: Visualized paranasal sinuses are clear. Visualized mastoid sinuses are clear. Visualized orbits demonstrate no focal abnormality. Other: Small frontal scalp hematomas, left larger than right. CT CERVICAL SPINE FINDINGS Alignment: Normal. Skull base and vertebrae: No acute fracture. No primary bone lesion or focal pathologic process. Soft tissues and spinal canal: No prevertebral fluid or  swelling. No visible canal hematoma. Disc levels:  Disc spaces are maintained.  No foraminal stenosis. Upper chest: Lung apices are clear. Other: No fluid collection or hematoma. IMPRESSION: 1. No acute intracranial pathology. 2.  No acute osseous injury of the cervical spine. 3. Small frontal scalp hematomas, left larger than right. Electronically Signed   By: Kathreen Devoid   On: 02/07/2019 15:34   Ct Cervical Spine Wo Contrast  Result  Date: 02/07/2019 CLINICAL DATA:  Status post fall, lump on the left forehead EXAM: CT HEAD WITHOUT CONTRAST CT CERVICAL SPINE WITHOUT CONTRAST TECHNIQUE: Multidetector CT imaging of the head and cervical spine was performed following the standard protocol without intravenous contrast. Multiplanar CT image reconstructions of the cervical spine were also generated. COMPARISON:  None. FINDINGS: CT HEAD FINDINGS Brain: No evidence of acute infarction, hemorrhage, hydrocephalus, extra-axial collection or mass lesion/mass effect. Vascular: No hyperdense vessel or unexpected calcification. Skull: No osseous abnormality. Sinuses/Orbits: Visualized paranasal sinuses are clear. Visualized mastoid sinuses are clear. Visualized orbits demonstrate no focal abnormality. Other: Small frontal scalp hematomas, left larger than right. CT CERVICAL SPINE FINDINGS Alignment: Normal. Skull base and vertebrae: No acute fracture. No primary bone lesion or focal pathologic process. Soft tissues and spinal canal: No prevertebral fluid or swelling. No visible canal hematoma. Disc levels:  Disc spaces are maintained.  No foraminal stenosis. Upper chest: Lung apices are clear. Other: No fluid collection or hematoma. IMPRESSION: 1. No acute intracranial pathology. 2.  No acute osseous injury of the cervical spine. 3. Small frontal scalp hematomas, left larger than right. Electronically Signed   By: Kathreen Devoid   On: 02/07/2019 15:34   Dg Humerus Left  Result Date: 02/07/2019 CLINICAL DATA:  Left arm pain  and abrasions following a fall today and 3 weeks ago. EXAM: LEFT HUMERUS - 2+ VIEW COMPARISON:  Left shoulder radiographs obtained today. FINDINGS: There is no evidence of fracture or other focal bone lesions. Soft tissues are unremarkable. IMPRESSION: No fracture or dislocation. Electronically Signed   By: Claudie Revering M.D.   On: 02/07/2019 15:56   Procedures Procedures (including critical care time)  Medications Ordered in ED Medications - No data to display   Initial Impression / Assessment and Plan / ED Course  I have reviewed the triage vital signs and the nursing notes.  Pertinent labs & imaging results that were available during my care of the patient were reviewed by me and considered in my medical decision making (see chart for details).        Assessment/Plan: 38 year old female presents status post fall.  Patient does have several areas of soft tissue injury.  She has what appears to be acute left-sided rib fracture, no signs of complicating features.  She does not appear to be in significant distress.  Remainder of her work-up is reassuring.  She is stable for outpatient management.  Discussed care with guardian with instructions to return for any new or worsening signs or symptoms.  Discussed need for incentive spirometer and to maintain physical activity.  They verbalized understanding and agreement to today's plan had no further questions or concerns at the time of discharge.    Final Clinical Impressions(s) / ED Diagnoses   Final diagnoses:  Fall, initial encounter  Closed fracture of one rib of left side, initial encounter  Multiple contusions    ED Discharge Orders         Ordered    HYDROcodone-acetaminophen (NORCO/VICODIN) 5-325 MG tablet  Every 6 hours PRN     02/07/19 1737           Okey Regal, PA-C 02/07/19 1749    Davonna Belling, MD 02/08/19 [phone]

## 2019-02-07 NOTE — ED Notes (Signed)
Contacted the social worker who is the patient's legal guardian to update her on patient's care.

## 2019-02-07 NOTE — Discharge Instructions (Signed)
Please read attached information. If you experience any new or worsening signs or symptoms please return to the emergency room for evaluation. Please follow-up with your primary care provider or specialist as discussed. Please use medication prescribed only as directed and discontinue taking if you have any concerning signs or symptoms.   °

## 2019-02-07 NOTE — ED Notes (Signed)
Got patient on the monitor did vitals patient is resting with caretaker at bedside

## 2019-02-07 NOTE — ED Notes (Signed)
Checked patient blood sugar it was 134 notified RN of blood sugar

## 2019-05-10 ENCOUNTER — Telehealth: Payer: Self-pay

## 2019-05-10 NOTE — Telephone Encounter (Signed)
Pt is on waitlist for a sooner appt. Dr. Brett Fairy has an appt tomorrow at 3:00pm open. I call pt to offer it to her. No answer, left a message asking her to call me back. If pt calls back, please offer this appt to her.

## 2019-05-11 ENCOUNTER — Encounter: Payer: Self-pay | Admitting: Physical Therapy

## 2019-05-11 ENCOUNTER — Other Ambulatory Visit: Payer: Self-pay

## 2019-05-11 ENCOUNTER — Ambulatory Visit: Payer: Medicare Other | Attending: Internal Medicine | Admitting: Physical Therapy

## 2019-05-11 DIAGNOSIS — R2689 Other abnormalities of gait and mobility: Secondary | ICD-10-CM | POA: Insufficient documentation

## 2019-05-11 DIAGNOSIS — M6281 Muscle weakness (generalized): Secondary | ICD-10-CM | POA: Insufficient documentation

## 2019-05-11 NOTE — Therapy (Signed)
[facility] [address] [place], [place], [address] Phone: [phone]   Fax:  [phone]  Physical Therapy Evaluation  Patient Details  Name: Alexandria Harvey MRN: [id number] Date of Birth: [birth year] Referring Provider (PT): Colon BranchAyyaz Qureshi, MD   Encounter Date: 05/11/2019  PT End of Session - 05/11/19 1423    Visit Number  1    Number of Visits  12    Date for PT Re-Evaluation  06/22/19    Authorization Type  Medicare/Medicaid    PT Start Time  1100    PT Stop Time  1147    PT Time Calculation (min)  47 min    Activity Tolerance  Patient tolerated treatment well    Behavior During Therapy  Flat affect       Past Medical History:  Diagnosis Date  . Diabetes mellitus without complication (HCC)   . Excessive anger   . GERD (gastroesophageal reflux disease)   . Hallucinations   . Hyperlipidemia   . Hypertension   . Mild mental retardation   . Schizophrenia (HCC)   . Seizures (HCC)   . Vitamin D deficiency     History reviewed. No pertinent surgical history.  There were no vitals filed for this visit.   Subjective Assessment - 05/11/19 1312    Subjective  Pt. is a 38 y/o female referred to PT for gait and balance difficulties with LE weakness. See pertinent history below. Pt. present for evaluation with caregiver who reports that she has had worsening issues with her gait and balance since this past June. She had a fall 02/07/19 down the stairs at home for which she was seen at ED and sustained rib fracture. She has been needing assistance to walk and the group home where she lives has had railing installed to help with safety. She does not currently use any AD to walk. Her caregiver reports she has had approximately 10 falls in the past 6 months with most recent earlier today-pt. reports she landed on her left wrist/forearm region with subsequent soreness noted. Pt. has also been referred for neurology consult but has not  yet had appointment.    Patient is accompained by:  --   caregiver   Pertinent History  autism, schizo-affective disorder, compulsive disorder, mild intellectual disability    Limitations  House hold activities;Standing;Walking    Diagnostic tests  x-rays s/p fall 02/07/19 revealing rib fracture    Currently in Pain?  Yes    Pain Score  3     Pain Location  Arm    Pain Orientation  Left   forearm   Pain Descriptors / Indicators  Sharp    Pain Type  Acute pain    Pain Onset  Today    Aggravating Factors   no specific aggs/eases noted         [facility] PT Assessment - 05/11/19 0001      Assessment   Medical Diagnosis  Unstable gait and balance, muscle weakness    Referring Provider (PT)  Colon BranchAyyaz Qureshi, MD    Onset Date/Surgical Date  02/07/19    Prior Therapy  none for current episode      Precautions   Precautions  --    Precaution Comments  High fall risk      Restrictions   Weight Bearing Restrictions  No      Balance Screen   Has the patient fallen in the past 6 months  Yes    How many  times?  10   per caregiver     Home Environment   Living Environment  Group home    Additional Comments  Lives in group home with caregiver assistance. 3 steps to enter home with bilateral rails, 13 steps with bilateral rail to second floor where her bedroom is located. Has rails and raised commode otherwise no AD      Prior Function   Level of Independence  Needs assistance with ADLs    Comments  Assistance for ADLs/IADLs at baseline but was able to ambulate independently without AD (currently requires assistance from group home stafff to ambulate)      Cognition   Overall Cognitive Status  History of cognitive impairments - at baseline      Observation/Other Assessments   Observations  --   mild edema left forearm     Sensation   Light Touch  --   pt. reports numbness and tingling in her left knee     ROM / Strength   AROM / PROM / Strength  Strength      Strength   Overall  Strength Comments  Knee flexion and extension MMTs 4/5 bilat., hip ER/IR 4/5 bilat., pt. had difficulty with directions for assessment of further LE MMTs so unable to formally assess    Strength Assessment Site  --    Right/Left Hip  --    Right/Left Knee  --    Right/Left Ankle  --      Ambulation/Gait   Gait Comments  Tinetti Balance 5/16, Gait 2/12, Total Tinetti score 7/28. Pt. ambulates from waiting area to treatment room with hand held assistance from caregiver with rail use with ataxic gait, decreased bilateral step length, unsteady with balance. Instructed in use RW with practice x 50 feet with min assist with cues for hand positioning on RW handles, body position in walker and cues for increased step length. Pt. also required interimttent min assist to remain seated at edge of mat during eval with tendency for trunk lean                Objective measurements completed on examination: See above findings.              PT Education - 05/11/19 1420    Education Details  POC, gait, safety    Person(s) Educated  Patient;Caregiver(s)    Methods  Explanation;Demonstration;Tactile cues;Verbal cues    Comprehension  Need further instruction;Verbal cues required;Tactile cues required       PT Short Term Goals - 05/11/19 1435      PT SHORT TERM GOAL #1   Title  Pt. and caregiver to be independent with HEP for LE strengthening and assisted balance exercises for performance at home    Baseline  no HEP    Time  3    Period  Weeks    Status  New      PT SHORT TERM GOAL #2   Title  Pt. to sit independently at edge of mat for at least 2 minutes for improved safety and independence with sitting to eat meals, perform toiletting and hygiene at home    Baseline  unable, needs intermittent min assist for sitting    Time  3    Period  Weeks    Status  New        PT Long Term Goals - 05/11/19 1437      PT LONG TERM GOAL #1   Title  Household ambulation mod I  with RW for  distances of at least 100 feet to improve safety and independence with home mobility    Baseline  needs caregiver assistance and use of rails to ambulate    Time  6    Period  Weeks    Status  New    Target Date  06/22/19      PT LONG TERM GOAL #2   Title  Improve Tinetti Gait + Balance score at least 5 points to work towards decreased fall risk    Baseline  7/28    Time  6    Period  Weeks    Status  New    Target Date  06/22/19      PT LONG TERM GOAL #3   Title  Increase LE strength to grossly 5/5 to improve ability for stair navigation at home and sit<>stand from lower chairs    Baseline  4/5 for muscles assessed    Time  6    Period  Weeks    Status  New    Target Date  06/22/19      PT LONG TERM GOAL #4   Title  Ascend/descend stairs to second floor of home with single step gait pattern mod I using bilateral rails without need for caregiver assistance    Baseline  needs caregiver assistance    Time  6    Period  Weeks    Status  New    Target Date  06/22/19             Plan - 05/11/19 1424    Clinical Impression Statement  Pt. presents with impaired gait and decreased balance with LE weakness. Tinetti Gait + Balance score suggestive of high fall risk. Unclear etiology of symptoms with status complicated by psychiatric history/cognitive difficulties impacting ability to assess during evaluation and pending neurologist consult to assess for possible neurological etiology of symptoms. Plan proceed with trial PT to work on gait, balance and strengthening to try and improve safety and independence with mobility. Regarding left forearm pain and some swelling noted today s/p fall reported by caregiver earlier this AM recommend to have this assessed by MD if pain persists.    Personal Factors and Comorbidities  Behavior Pattern;Comorbidity 3+    Comorbidities  autism, schizo affective disorder, compulsive disorder, diabetic, see PMH    Examination-Activity Limitations   Squat;Transfers;Sit;Stairs;Stand;Toileting;Locomotion Level;Lift;Hygiene/Grooming;Dressing;Bathing    Examination-Participation Restrictions  Community Activity;Cleaning;Meal Prep    Stability/Clinical Decision Making  Unstable/Unpredictable    Clinical Decision Making  High    Rehab Potential  Fair   due to unclear symptom etiology, complicating factors from [place]   PT Frequency  2x / week    PT Duration  6 weeks    PT Treatment/Interventions  ADLs/Self Care Home Management;Stair training;Balance training;Functional mobility training;Neuromuscular re-education;Therapeutic activities;Therapeutic exercise;Gait training;Manual techniques    PT Next Visit Plan  use gait belt-gait training with RW, attempt LE strengthening initially in sitting vs. on mat due to poor balance, try standing balance exercises    PT Home Exercise Plan  no HEP yet-will issue at future session as appropriate    Recommended Other Services  pt. is pending neurology consult, would benefit from RW and tub bench for safety    Consulted and Agree with Plan of Care  Patient;Family member/caregiver       Patient will benefit from skilled therapeutic intervention in order to improve the following deficits and impairments:  Abnormal gait, Decreased strength, Difficulty walking, Decreased balance,  Postural dysfunction, Decreased activity tolerance  Visit Diagnosis: Other abnormalities of gait and mobility  Muscle weakness (generalized)     Problem List Patient Active Problem List   Diagnosis Date Noted  . Agitation   . 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 Schizoaffective disorder, depressive type (HCC) 01/18/2015  . Type 2 diabetes mellitus without complication (HCC)   . ALLERGIC RHINITIS 12/19/2009  . DYSPEPSIA 09/26/2009  . CONSTIPATION 09/26/2009  . TINEA PEDIS 12/17/2008  . HYPERLIPIDEMIA 01/08/2007  . Essential hypertension 01/08/2007  . Seizure disorder (HCC) 01/08/2007    Lazarus Gowdahristopher Taylon Coole, PT,  DPT 05/11/19 2:44 PM  [facility] [address] [place], [place], [address] Phone: [phone]   Fax:  [phone]  Name: Alexandria Haberashia Doyon MRN: [id number] Date of Birth: 1981-03-25

## 2019-05-31 ENCOUNTER — Ambulatory Visit: Payer: Medicare Other | Admitting: Physical Therapy

## 2019-05-31 ENCOUNTER — Encounter: Payer: Self-pay | Admitting: Physical Therapy

## 2019-05-31 ENCOUNTER — Other Ambulatory Visit: Payer: Self-pay

## 2019-05-31 DIAGNOSIS — M6281 Muscle weakness (generalized): Secondary | ICD-10-CM

## 2019-05-31 DIAGNOSIS — R2689 Other abnormalities of gait and mobility: Secondary | ICD-10-CM | POA: Diagnosis not present

## 2019-05-31 NOTE — Therapy (Signed)
[facility] [address] [place], [place], [address] Phone: [phone]   Fax:  [phone]  Physical Therapy Treatment  Patient Details  Name: Alexandria Harvey MRN: [id number] Date of Birth: [birth year] Referring Provider (PT): Colon Branch, MD   Encounter Date: 05/31/2019  PT End of Session - 05/31/19 1453    Visit Number  2    Number of Visits  12    Date for PT Re-Evaluation  06/22/19    Authorization Type  Medicare/Medicaid    PT Start Time  1410    PT Stop Time  1445   30 minutes direct treatment time   PT Time Calculation (min)  35 min    Activity Tolerance  Other (comment)   limited ability to perform standing activities today   Behavior During Therapy  Flat affect;Restless       Past Medical History:  Diagnosis Date  . Diabetes mellitus without complication (HCC)   . Excessive anger   . GERD (gastroesophageal reflux disease)   . Hallucinations   . Hyperlipidemia   . Hypertension   . Mild mental retardation   . Schizophrenia (HCC)   . Seizures (HCC)   . Vitamin D deficiency     History reviewed. No pertinent surgical history.  There were no vitals filed for this visit.  Subjective Assessment - 05/31/19 1412    Subjective  Pt. presents for first follow up since initial eval. She reports about 2 falls have occured since with caregiver noting more balance issues when afraid of falling. No word yet on neuro consult.    Patient is accompained by:  --   guardian   Pertinent History  autism, schizo-affective disorder, compulsive disorder, mild intellectual disability    Limitations  House hold activities;Standing;Walking    Diagnostic tests  x-rays s/p fall 02/07/19 revealing rib fracture    Currently in Pain?  No/denies                       [facility] Adult PT Treatment/Exercise - 05/31/19 0001      Ambulation/Gait   Gait Comments  Pt. ambulates from waiting area to/from tx. room x 50 feet ea.  way with min assist without AD, cues for upright posture, avoidance of trunk lean and step-throught gait with increased toe clearance bilat.      Exercises   Exercises  Knee/Hip      Knee/Hip Exercises: Standing   Other Standing Knee Exercises  marches/alt. weightshift x 15 ea. bilat., multiple attempts to try heel raises but pt. unable      Knee/Hip Exercises: Seated   Long Arc Quad  AROM;Strengthening;Both;1 set;10 reps    Long Arc Quad Weight  3 lbs.    Other Seated Knee/Hip Exercises  scapular retraction x 10 reps to assist posture for standing balance + strengthening exercises    Marching  AROM;Both;15 reps    Hamstring Curl  AROM;Strengthening;Both;10 reps    Hamstring Limitations  green band    Abd/Adduction Limitations  abd with green band x 10 reps, add. isometric with pink ball squeeze x 10 reps    Sit to Sand  2 sets;5 reps             PT Education - 05/31/19 1455    Education Details  gait, posture    Person(s) Educated  Patient;Caregiver(s)    Methods  Explanation;Demonstration;Tactile cues;Verbal cues    Comprehension  Tactile cues required;Verbal cues required;Need further instruction  PT Short Term Goals - 05/11/19 1435      PT SHORT TERM GOAL #1   Title  Pt. and caregiver to be independent with HEP for LE strengthening and assisted balance exercises for performance at home    Baseline  no HEP    Time  3    Period  Weeks    Status  New      PT SHORT TERM GOAL #2   Title  Pt. to sit independently at edge of mat for at least 2 minutes for improved safety and independence with sitting to eat meals, perform toiletting and hygiene at home    Baseline  unable, needs intermittent min assist for sitting    Time  3    Period  Weeks    Status  New        PT Long Term Goals - 05/11/19 1437      PT LONG TERM GOAL #1   Title  Household ambulation mod I with RW for distances of at least 100 feet to improve safety and independence with home mobility     Baseline  needs caregiver assistance and use of rails to ambulate    Time  6    Period  Weeks    Status  New    Target Date  06/22/19      PT LONG TERM GOAL #2   Title  Improve Tinetti Gait + Balance score at least 5 points to work towards decreased fall risk    Baseline  7/28    Time  6    Period  Weeks    Status  New    Target Date  06/22/19      PT LONG TERM GOAL #3   Title  Increase LE strength to grossly 5/5 to improve ability for stair navigation at home and sit<>stand from lower chairs    Baseline  4/5 for muscles assessed    Time  6    Period  Weeks    Status  New    Target Date  06/22/19      PT LONG TERM GOAL #4   Title  Ascend/descend stairs to second floor of home with single step gait pattern mod I using bilateral rails without need for caregiver assistance    Baseline  needs caregiver assistance    Time  6    Period  Weeks    Status  New    Target Date  06/22/19            Plan - 05/31/19 1455    Clinical Impression Statement  Pt. returns for first follow up tx. since eval. Mild improvement noted with decreased assistance for gait from waiting area to tx. room. For exercises mod-max cues required throughout. Able to complete seated exercises with cues but limited ability to stand during session and complete standing + balance exercises for unclear reasons-pt. would stand with initial CGA-min assist then flex forward with her posture and need to sit-unclear if associated with weakness vs. behavioral issue.    Personal Factors and Comorbidities  Behavior Pattern;Comorbidity 3+    Comorbidities  autism, schizo affective disorder, compulsive disorder, diabetic, see PMH    Examination-Activity Limitations  Squat;Transfers;Sit;Stairs;Stand;Toileting;Locomotion Level;Lift;Hygiene/Grooming;Dressing;Bathing    Examination-Participation Restrictions  Community Activity;Cleaning;Meal Prep    Stability/Clinical Decision Making  Unstable/Unpredictable    Clinical Decision  Making  High    PT Frequency  2x / week    PT Duration  6 weeks  PT Treatment/Interventions  ADLs/Self Care Home Management;Stair training;Balance training;Functional mobility training;Neuromuscular re-education;Therapeutic activities;Therapeutic exercise;Gait training;Manual techniques    PT Next Visit Plan  Continue to try and perform/progress standing and balance exercises as tolerated    PT Home Exercise Plan  no HEP yet-will issue at future session as appropriate    Consulted and Agree with Plan of Care  Patient;Family member/caregiver       Patient will benefit from skilled therapeutic intervention in order to improve the following deficits and impairments:  Abnormal gait, Decreased strength, Difficulty walking, Decreased balance, Postural dysfunction, Decreased activity tolerance  Visit Diagnosis: Other abnormalities of gait and mobility  Muscle weakness (generalized)     Problem List Patient Active Problem List   Diagnosis Date Noted  . Agitation   . 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 Schizoaffective disorder, depressive type (HCC) 01/18/2015  . Type 2 diabetes mellitus without complication (HCC)   . ALLERGIC RHINITIS 12/19/2009  . DYSPEPSIA 09/26/2009  . CONSTIPATION 09/26/2009  . TINEA PEDIS 12/17/2008  . HYPERLIPIDEMIA 01/08/2007  . Essential hypertension 01/08/2007  . Seizure disorder (HCC) 01/08/2007    Lazarus Gowda, PT, DPT 05/31/19 3:02 PM  [facility] [address] [place], [place], [address] Phone: [phone]   Fax:  [phone]  Name: Alexandria Harvey MRN: [id number] Date of Birth: 1981-06-12

## 2019-06-01 ENCOUNTER — Ambulatory Visit: Payer: Medicare Other | Admitting: Physical Therapy

## 2019-06-05 ENCOUNTER — Encounter: Payer: Self-pay | Admitting: Physical Therapy

## 2019-06-05 ENCOUNTER — Ambulatory Visit: Payer: Medicare Other | Admitting: Physical Therapy

## 2019-06-05 ENCOUNTER — Other Ambulatory Visit: Payer: Self-pay

## 2019-06-05 DIAGNOSIS — M6281 Muscle weakness (generalized): Secondary | ICD-10-CM

## 2019-06-05 DIAGNOSIS — R2689 Other abnormalities of gait and mobility: Secondary | ICD-10-CM

## 2019-06-05 NOTE — Therapy (Signed)
[facility] [address] [place], [place], [address] Phone: [phone]   Fax:  [phone]  Physical Therapy Treatment  Patient Details  Name: Alexandria Haberashia Hazzard MRN: [id number] Date of Birth: 1981-06-22 Referring Provider (PT): Colon BranchAyyaz Qureshi, MD   Encounter Date: 06/05/2019  PT End of Session - 06/05/19 1607    Visit Number  3    Number of Visits  12    Date for PT Re-Evaluation  06/22/19    Authorization Type  Medicare/Medicaid    PT Start Time  1409    PT Stop Time  1440   see plan   PT Time Calculation (min)  31 min    Activity Tolerance  Other (comment)    Behavior During Therapy  Restless       Past Medical History:  Diagnosis Date  . Diabetes mellitus without complication (HCC)   . Excessive anger   . GERD (gastroesophageal reflux disease)   . Hallucinations   . Hyperlipidemia   . Hypertension   . Mild mental retardation   . Schizophrenia (HCC)   . Seizures (HCC)   . Vitamin D deficiency     History reviewed. No pertinent surgical history.  There were no vitals filed for this visit.  Subjective Assessment - 06/05/19 1559    Subjective  Pt. has visit with neurologist scheduled for 06/15/19. She arrives ambulating without AD, per caregiver she does better with her mobility at home but tendency to show worse symptoms when presenting for appointment for difficulty walking.    Patient is accompained by:  --   caregiver/guardian   Pertinent History  autism, schizo-affective disorder, compulsive disorder, mild intellectual disability    Limitations  House hold activities;Standing;Walking    Diagnostic tests  x-rays s/p fall 02/07/19 revealing rib fracture                       [facility] Adult PT Treatment/Exercise - 06/05/19 0001      Ambulation/Gait   Gait Comments  Pt. ambulates to/from waiting area to treatment room min assist-cues on return to waiting area for correction of left trunk lean and  increased steplength/foot clearance bilat. 60 feet x 2      Knee/Hip Exercises: Standing   Other Standing Knee Exercises  standing posture with mod-max cues for neutral posture/to avoid left lateral trunk lean, use of mirror for visual cue , also worked on neutral sitting posture with verbal + tactile cues for upright neutral posture and hand positioning on edge of mat to assist      Knee/Hip Exercises: Seated   Long Arc Quad  AROM;Strengthening;Both;1 set;10 reps    Long Arc Quad Weight  3 lbs.    Other Seated Knee/Hip Exercises  seated hpi add. isometric x 10 reps    Hamstring Curl  AROM;Strengthening;Both;10 reps    Hamstring Limitations  green band    Abd/Adduction Limitations  abd with green band x 10 reps, add. isometric with pink ball squeeze x 10 reps    Sit to Sand  2 sets;5 reps             PT Education - 06/05/19 1606    Education Details  posture, gait, POC    Person(s) Educated  Patient;Caregiver(s)    Methods  Explanation;Demonstration;Tactile cues;Verbal cues    Comprehension  Verbal cues required;Need further instruction;Tactile cues required       PT Short Term Goals - 05/11/19 1435  PT SHORT TERM GOAL #1   Title  Pt. and caregiver to be independent with HEP for LE strengthening and assisted balance exercises for performance at home    Baseline  no HEP    Time  3    Period  Weeks    Status  New      PT SHORT TERM GOAL #2   Title  Pt. to sit independently at edge of mat for at least 2 minutes for improved safety and independence with sitting to eat meals, perform toiletting and hygiene at home    Baseline  unable, needs intermittent min assist for sitting    Time  3    Period  Weeks    Status  New        PT Long Term Goals - 05/11/19 1437      PT LONG TERM GOAL #1   Title  Household ambulation mod I with RW for distances of at least 100 feet to improve safety and independence with home mobility    Baseline  needs caregiver assistance and use of  rails to ambulate    Time  6    Period  Weeks    Status  New    Target Date  06/22/19      PT LONG TERM GOAL #2   Title  Improve Tinetti Gait + Balance score at least 5 points to work towards decreased fall risk    Baseline  7/28    Time  6    Period  Weeks    Status  New    Target Date  06/22/19      PT LONG TERM GOAL #3   Title  Increase LE strength to grossly 5/5 to improve ability for stair navigation at home and sit<>stand from lower chairs    Baseline  4/5 for muscles assessed    Time  6    Period  Weeks    Status  New    Target Date  06/22/19      PT LONG TERM GOAL #4   Title  Ascend/descend stairs to second floor of home with single step gait pattern mod I using bilateral rails without need for caregiver assistance    Baseline  needs caregiver assistance    Time  6    Period  Weeks    Status  New    Target Date  06/22/19            Plan - 06/05/19 1608    Clinical Impression Statement  Tried again to perform standing exercises but limited ability to perform/progress with pt. unable to perform and with frequent verbal, tactile cues as well as trial visual cues with mirror to correct left lateral trunk lean-unclear if associated with underlying neurological issue vs. if more behavioral. Ability of pt. to meaningfully participate in therapy has been limited-if no significant change in ability to participate and progress within the next 1-2 visits plan d/c with further status pending visit with neurologist vs. if pt. would do better with PT in another environment such as home health.    Personal Factors and Comorbidities  Behavior Pattern;Comorbidity 3+    Examination-Activity Limitations  Squat;Transfers;Sit;Stairs;Stand;Toileting;Locomotion Level;Lift;Hygiene/Grooming;Dressing;Bathing    Examination-Participation Restrictions  Community Activity;Cleaning;Meal Prep    Stability/Clinical Decision Making  Unstable/Unpredictable    Clinical Decision Making  High    Rehab  Potential  Fair    PT Frequency  2x / week    PT Duration  6 weeks  PT Treatment/Interventions  ADLs/Self Care Home Management;Stair training;Balance training;Functional mobility training;Neuromuscular re-education;Therapeutic activities;Therapeutic exercise;Gait training;Manual techniques    PT Home Exercise Plan  no HEP yet-will issue at future session as appropriate    Consulted and Agree with Plan of Care  Patient;Family member/caregiver       Patient will benefit from skilled therapeutic intervention in order to improve the following deficits and impairments:  Abnormal gait, Decreased strength, Difficulty walking, Decreased balance, Postural dysfunction, Decreased activity tolerance  Visit Diagnosis: Other abnormalities of gait and mobility  Muscle weakness (generalized)     Problem List Patient Active Problem List   Diagnosis Date Noted  . Agitation   . 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 Schizoaffective disorder, depressive type (HCC) 01/18/2015  . Type 2 diabetes mellitus without complication (HCC)   . ALLERGIC RHINITIS 12/19/2009  . DYSPEPSIA 09/26/2009  . CONSTIPATION 09/26/2009  . TINEA PEDIS 12/17/2008  . HYPERLIPIDEMIA 01/08/2007  . Essential hypertension 01/08/2007  . Seizure disorder (HCC) 01/08/2007   Lazarus Gowda, PT, DPT 06/05/19 4:15 PM  [facility] [address] [place], [place], [address] Phone: [phone]   Fax:  [phone]  Name: Alexandria Harvey MRN: [id number] Date of Birth: 05-04-1981

## 2019-06-07 ENCOUNTER — Ambulatory Visit: Payer: Medicare Other | Admitting: Physical Therapy

## 2019-06-07 NOTE — Therapy (Signed)
[place], [place], [address] Phone: [phone]   Fax:  [phone]  Physical Therapy Treatment/Discharge  Patient Details  Name: Alexandria Harvey MRN: [id number] Date of Birth: Dec 14, 1980 Referring Provider (PT): Cleda Mccreedy, MD   Encounter Date: 06/05/2019    Past Medical History:  Diagnosis Date  . Diabetes mellitus without complication ([place])   . Excessive anger   . GERD (gastroesophageal reflux disease)   . Hallucinations   . Hyperlipidemia   . Hypertension   . Mild mental retardation   . Schizophrenia ([place])   . Seizures ([place])   . Vitamin D deficiency     History reviewed. No pertinent surgical history.  There were no vitals filed for this visit.                              PT Short Term Goals - 05/11/19 1435      PT SHORT TERM GOAL #1   Title  Pt. and caregiver to be independent with HEP for LE strengthening and assisted balance exercises for performance at home    Baseline  no HEP    Time  3    Period  Weeks    Status  New      PT SHORT TERM GOAL #2   Title  Pt. to sit independently at edge of mat for at least 2 minutes for improved safety and independence with sitting to eat meals, perform toiletting and hygiene at home    Baseline  unable, needs intermittent min assist for sitting    Time  3    Period  Weeks    Status  New        PT Long Term Goals - 05/11/19 1437      PT LONG TERM GOAL #1   Title  Household ambulation mod I with RW for distances of at least 100 feet to improve safety and independence with home mobility    Baseline  needs caregiver assistance and use of rails to ambulate    Time  6    Period  Weeks    Status  New    Target Date  06/22/19      PT LONG TERM GOAL #2   Title  Improve Tinetti Gait + Balance score at least 5 points to work towards decreased fall risk    Baseline  7/28    Time  6    Period  Weeks    Status  New     Target Date  06/22/19      PT LONG TERM GOAL #3   Title  Increase LE strength to grossly 5/5 to improve ability for stair navigation at home and sit<>stand from lower chairs    Baseline  4/5 for muscles assessed    Time  6    Period  Weeks    Status  New    Target Date  06/22/19      PT LONG TERM GOAL #4   Title  Ascend/descend stairs to second floor of home with single step gait pattern mod I using bilateral rails without need for caregiver assistance    Baseline  needs caregiver assistance    Time  6    Period  Weeks    Status  New    Target Date  06/22/19              Patient will benefit from skilled  therapeutic intervention in order to improve the following deficits and impairments:  Abnormal gait, Decreased strength, Difficulty walking, Decreased balance, Postural dysfunction, Decreased activity tolerance  Visit Diagnosis: Other abnormalities of gait and mobility  Muscle weakness (generalized)     Problem List Patient Active Problem List   Diagnosis Date Noted  . Agitation   . 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 Schizoaffective disorder, depressive type ([place]) 01/18/2015  . Type 2 diabetes mellitus without complication ([place])   . ALLERGIC RHINITIS 12/19/2009  . DYSPEPSIA 09/26/2009  . CONSTIPATION 09/26/2009  . TINEA PEDIS 12/17/2008  . HYPERLIPIDEMIA 01/08/2007  . Essential hypertension 01/08/2007  . Seizure disorder ([place]) 01/08/2007       PHYSICAL THERAPY DISCHARGE SUMMARY  Visits from Start of Care: 3  Current functional level related to goals / functional outcomes: Patient's caregiver contacted clinic and requested to cancel future appointments-reports they plan follow up with MD and may pursue other options such as home health therapy for improved ability for patient to participate in treatment sessions.   Remaining deficits: Decreased balance, impaired gait, falls, LE weakness   Education / Equipment: NA Plan: Patient  agrees to discharge.  Patient goals were not met. Patient is being discharged due to the patient's request.  ?????          Beaulah Dinning, PT, DPT 06/07/19 1:07 PM    [address] [facility] [address] [place], [place], [address] Phone: [phone]   Fax:  [phone]  Name: Alexandria Harvey MRN: [id number] Date of Birth: 10-Nov-1980

## 2019-06-12 ENCOUNTER — Ambulatory Visit: Payer: Medicare Other | Admitting: Physical Therapy

## 2019-06-14 ENCOUNTER — Ambulatory Visit: Payer: Medicare Other | Admitting: Physical Therapy

## 2019-06-15 ENCOUNTER — Other Ambulatory Visit: Payer: Self-pay

## 2019-06-15 ENCOUNTER — Encounter: Payer: Self-pay | Admitting: Neurology

## 2019-06-15 ENCOUNTER — Ambulatory Visit (INDEPENDENT_AMBULATORY_CARE_PROVIDER_SITE_OTHER): Payer: Medicare Other | Admitting: Neurology

## 2019-06-15 ENCOUNTER — Telehealth: Payer: Self-pay | Admitting: Neurology

## 2019-06-15 ENCOUNTER — Encounter

## 2019-06-15 VITALS — BP 106/64 | HR 88 | Temp 96.5°F | Ht 62.5 in | Wt 142.4 lb

## 2019-06-15 DIAGNOSIS — E538 Deficiency of other specified B group vitamins: Secondary | ICD-10-CM | POA: Diagnosis not present

## 2019-06-15 DIAGNOSIS — Z5181 Encounter for therapeutic drug level monitoring: Secondary | ICD-10-CM

## 2019-06-15 DIAGNOSIS — R29898 Other symptoms and signs involving the musculoskeletal system: Secondary | ICD-10-CM

## 2019-06-15 DIAGNOSIS — G2111 Neuroleptic induced parkinsonism: Secondary | ICD-10-CM

## 2019-06-15 DIAGNOSIS — G3281 Cerebellar ataxia in diseases classified elsewhere: Secondary | ICD-10-CM

## 2019-06-15 DIAGNOSIS — R269 Unspecified abnormalities of gait and mobility: Secondary | ICD-10-CM | POA: Diagnosis not present

## 2019-06-15 HISTORY — DX: Other symptoms and signs involving the musculoskeletal system: R29.898

## 2019-06-15 MED ORDER — CARBIDOPA-LEVODOPA 25-100 MG PO TABS: ORAL_TABLET | ORAL | 2 refills | Status: DC

## 2019-06-15 NOTE — Patient Instructions (Signed)
We will start Sinemet for the walking.  Sinemet (carbidopa) may result in confusion or hallucinations, drowsiness, nausea, or dizziness. If any significant side effects are noted, please contact our office. Sinemet may not be well absorbed when taken with high protein meals, if tolerated it is best to take 30-45 minutes before you eat.  

## 2019-06-15 NOTE — Telephone Encounter (Signed)
medicare/medicaid order sent to [facility]. No auth they will reach out to the patient to schedule.  °

## 2019-06-15 NOTE — Progress Notes (Signed)
Reason for visit: Gait disorder  Referring physician: Dr. Felizardo Hoffmann [name] is a 38 y.o. female  History of present illness:  [name] is a 38 year old right-handed white female with a history of significant psychiatric illness, she has been on Clozaril treatment.  The patient claims that she is no longer having problems with hallucinations.  She lives in a group home, and has had onset of a gait disturbance.  She does have a history of diabetes but she denies any significant numbness in the feet, she does have some hand numbness.  She denies any back pain or neck pain.  She does have occasional headaches.  She believes that she began having some troubles with walking and April 2020, but the walking has become much more of a problem since August 2020.  The patient is falling on a regular basis and bumping her head.  She has had a tendency to lean to the right, she has had progressive worsening of her dropped head issues, she cannot hold her head up for long periods of time.  She does have tremors that are mainly noticeable when she uses her hand, she is on Depakote therapy.  She has noted that she will come overbalanced, she will get into situations where her body gets ahead of her feet and she will fall.  The patient does report some dizziness with standing on occasion.  She reports no definite focal weakness.  She denies issues with controlling the bowels or the bladder but she does have some constipation issues.  She denies any vision changes.  She is sent to this office for an evaluation.  She lives in an adult home, the medications are administered to her.  The patient is now requiring some assistance with bathing.  Past Medical History:  Diagnosis Date  . Diabetes mellitus without complication ([place])   . Excessive anger   . GERD (gastroesophageal reflux disease)   . Hallucinations   . Hyperlipidemia   . Hypertension   . Mild mental retardation   . Schizophrenia ([place])   .  Seizures ([place])   . Vitamin D deficiency     Past Surgical History:  Procedure Laterality Date  . NO PAST SURGERIES      Family History  Problem Relation Age of Onset  . Diabetes Mellitus II Mother   . Cancer Father     Social history:  reports that she has never smoked. She has never used smokeless tobacco. She reports that she does not drink alcohol or use drugs.  Medications:  Prior to Admission medications   Medication Sig Start Date End Date Taking? Authorizing Provider  atenolol (TENORMIN) 50 MG tablet Take 1 tablet (50 mg total) by mouth daily. 01/25/15  Yes Hildred Priest, MD  atorvastatin (LIPITOR) 80 MG tablet Take 1 tablet (80 mg total) by mouth at bedtime. 01/25/15  Yes Hildred Priest, MD  benztropine (COGENTIN) 0.5 MG tablet Take 0.5 mg by mouth 2 (two) times daily.   Yes [provider]  Blood Glucose Monitoring Suppl (ONE TOUCH ULTRA 2) w/Device KIT by Does not apply route. Use to check blood sugar as directed.   Yes [provider]  CALCIUM PO Take 1 tablet by mouth daily. '400mg'$  tablets   Yes [provider]  cetirizine (ZYRTEC) 10 MG tablet Take 10 mg by mouth at bedtime.   Yes [provider]  clonazePAM (KLONOPIN) 0.5 MG tablet Take 0.5 mg by mouth 2 (two) times daily as needed  for anxiety (taking 1/2 tab BID).   Yes [provider]  clozapine (CLOZARIL) 200 MG tablet Take 200 mg by mouth 2 (two) times daily.   Yes [provider]  dicyclomine (BENTYL) 10 MG capsule Take 1 capsule (10 mg total) by mouth every 8 (eight) hours as needed for spasms. 06/29/17  Yes Armbruster, Carlota Raspberry, MD  divalproex (DEPAKOTE) 500 MG DR tablet Take 1 tablet (500 mg total) by mouth 3 (three) times daily. 01/25/15  Yes Hildred Priest, MD  EPINEPHrine 0.3 mg/0.3 mL IJ SOAJ injection Inject 0.3 mg into the muscle as directed.   Yes [provider]  lisinopril (PRINIVIL,ZESTRIL) 2.5 MG tablet Take  1 tablet (2.5 mg total) by mouth daily. 01/25/15  Yes Hildred Priest, MD  LORazepam (ATIVAN) 0.5 MG tablet Take 0.5 mg by mouth every 8 (eight) hours.   Yes [provider]  lubiprostone (AMITIZA) 24 MCG capsule Take 24 mcg by mouth 2 (two) times daily with a meal.   Yes [provider]  medroxyPROGESTERone (DEPO-PROVERA) 150 MG/ML injection Inject 150 mg into the muscle every 3 (three) months.   Yes [provider]  metFORMIN (GLUCOPHAGE) 500 MG tablet Take 1,000 mg by mouth 2 (two) times daily with a meal.    Yes [provider]  omeprazole (PRILOSEC) 20 MG capsule Take 20 mg by mouth daily.   Yes [provider]  polyethylene glycol (MIRALAX / GLYCOLAX) packet Take 17 g by mouth 2 (two) times daily. 06/29/17  Yes Armbruster, Carlota Raspberry, MD  Simethicone 80 MG TABS Take by mouth.   Yes [provider]  Vitamin D, Ergocalciferol, (DRISDOL) 50000 units CAPS capsule Take 50,000 Units by mouth 2 (two) times a week.   Yes [provider]      Allergies  Allergen Reactions  . Haloperidol Lactate Other (See Comments)    unk  . Other Rash    Strawberries  . Shrimp [Shellfish Allergy] Hives and Rash    ROS:  Out of a complete 14 system review of symptoms, the patient complains only of the following symptoms, and all other reviewed systems are negative.  Walking difficulty Tremor  Blood pressure 106/64, pulse 88, temperature (!) 96.5 F (35.8 C), temperature source Temporal, height 5' 2.5" (1.588 m), weight 142 lb 6 oz (64.6 kg), SpO2 96 %.  Physical Exam  General: The patient is alert and cooperative at the time of the examination.  Eyes: Pupils are equal, round, and reactive to light. Discs are flat bilaterally.  Neck: The neck is supple, no carotid bruits are noted.  Respiratory: The respiratory examination is clear.  Cardiovascular: The cardiovascular examination reveals a regular rate and rhythm, no obvious  murmurs or rubs are noted.  Skin: Extremities are without significant edema.  Neurologic Exam  Mental status: The patient is alert and oriented x 3 at the time of the examination. The patient has apparent normal recent and remote memory, with an apparently normal attention span and concentration ability.  Cranial nerves: Facial symmetry is present. There is good sensation of the face to pinprick and soft touch bilaterally. The strength of the facial muscles and the muscles to head turning and shoulder shrug are normal bilaterally. Speech is well enunciated, no aphasia or dysarthria is noted. Extraocular movements are full. Visual fields are full. The tongue is midline, and the patient has symmetric elevation of the soft palate. No obvious hearing deficits are noted.  The patient has a tendency to lean to  the right, she cannot keep her head fully extended.  The patient appears to have decreased eye blink frequency, masking of the face.  Motor: The motor testing reveals 5 over 5 strength of all 4 extremities. Good symmetric motor tone is noted throughout.  Sensory: Sensory testing is intact to pinprick, soft touch, vibration sensation, and position sense on all 4 extremities.  No definite stocking pattern of pinprick deficit in the lower extremities was noted.  No evidence of extinction is noted.  Coordination: Cerebellar testing reveals good finger-nose-finger and heel-to-shin bilaterally.  The patient does have some apraxia with use of the lower extremities.  No asterixis is seen with arms outstretched but the patient does have a fine action tremor with both hands.  Gait and station: Gait is minimally wide-based, the patient can walk independently but has some difficulty with turns.  The patient does have a symmetric arm swing but arm swing is decreased bilaterally.  Tandem gait is unsteady.  Romberg is unsteady.  Reflexes: Deep tendon reflexes are symmetric and normal bilaterally. Toes are  downgoing bilaterally.   Assessment/Plan:  1.  Parkinsonism, gait disturbance  2.  Dropped head syndrome  The patient appears to have features of parkinsonism, she is on Clozaril which should minimize the risk for these features.  The patient will be set up for blood work today, she will have MRI of the brain.  The patient will be started on Sinemet taking the 25/100 mg tablets, 1/2 tablet 3 times daily for 3 weeks then go to 1 full tablet 3 times daily.  Need to look out for hallucinations on this medication.  The patient will be set up for physical therapy for gait training.  She will follow-up here in 3 months.  The patient indicates that her new primary care physician will be Dr. Carola Rhine, telephone [phone].  Jill Alexanders MD 06/15/2019 12:08 PM  [facility] [address] [place],  [address]  Phone [phone] Fax [phone]

## 2019-06-16 LAB — COMPREHENSIVE METABOLIC PANEL
ALT: 12 IU/L (ref 0–32)
AST: 18 IU/L (ref 0–40)
Albumin/Globulin Ratio: 1.2 (ref 1.2–2.2)
Albumin: 3.5 g/dL — ABNORMAL LOW (ref 3.8–4.8)
Alkaline Phosphatase: 68 IU/L (ref 39–117)
BUN/Creatinine Ratio: 21 (ref 9–23)
BUN: 15 mg/dL (ref 6–20)
Bilirubin Total: 0.3 mg/dL (ref 0.0–1.2)
CO2: 21 mmol/L (ref 20–29)
Calcium: 9.5 mg/dL (ref 8.7–10.2)
Chloride: 106 mmol/L (ref 96–106)
Creatinine, Ser: 0.7 mg/dL (ref 0.57–1.00)
GFR calc Af Amer: 129 mL/min/{1.73_m2} (ref >59)
GFR calc non Af Amer: 112 mL/min/{1.73_m2} (ref >59)
Globulin, Total: 3 g/dL (ref 1.5–4.5)
Glucose: 131 mg/dL — ABNORMAL HIGH (ref 65–99)
Potassium: 4.9 mmol/L (ref 3.5–5.2)
Sodium: 140 mmol/L (ref 134–144)
Total Protein: 6.5 g/dL (ref 6.0–8.5)

## 2019-06-16 LAB — VITAMIN B12: Vitamin B-12: 695 pg/mL (ref 232–1245)

## 2019-06-16 LAB — AMMONIA: Ammonia: 31 ug/dL (ref 30–130)

## 2019-06-16 LAB — CBC WITH DIFFERENTIAL/PLATELET
Basophils Absolute: 0 10*3/uL (ref 0.0–0.2)
Basos: 0 %
EOS (ABSOLUTE): 0 10*3/uL (ref 0.0–0.4)
Eos: 0 %
Hematocrit: 37.1 % (ref 34.0–46.6)
Hemoglobin: 12 g/dL (ref 11.1–15.9)
Immature Grans (Abs): 0.1 10*3/uL (ref 0.0–0.1)
Immature Granulocytes: 1 %
Lymphocytes Absolute: 3.8 10*3/uL — ABNORMAL HIGH (ref 0.7–3.1)
Lymphs: 37 %
MCH: 29.9 pg (ref 26.6–33.0)
MCHC: 32.3 g/dL (ref 31.5–35.7)
MCV: 92 fL (ref 79–97)
Monocytes Absolute: 0.8 10*3/uL (ref 0.1–0.9)
Monocytes: 8 %
Neutrophils Absolute: 5.6 10*3/uL (ref 1.4–7.0)
Neutrophils: 54 %
Platelets: 201 10*3/uL (ref 150–450)
RBC: 4.02 x10E6/uL (ref 3.77–5.28)
RDW: 15.1 % (ref 11.7–15.4)
WBC: 10.4 10*3/uL (ref 3.4–10.8)

## 2019-06-16 LAB — RPR: RPR Ser Ql: NONREACTIVE

## 2019-06-16 LAB — SEDIMENTATION RATE: Sed Rate: 5 mm/hr (ref 0–32)

## 2019-06-16 LAB — VALPROIC ACID LEVEL: Valproic Acid Lvl: 96 ug/mL (ref 50–100)

## 2019-06-19 ENCOUNTER — Encounter: Payer: Medicare Other | Admitting: Physical Therapy

## 2019-06-19 ENCOUNTER — Telehealth: Payer: Self-pay

## 2019-06-19 NOTE — Telephone Encounter (Signed)
-----   Message from Kathrynn Ducking, MD sent at 06/16/2019  4:47 PM EDT ----- Blood work is unremarkable with exception of a slightly low albumin level.  Please call the patient. ----- Message ----- From: Lavone Neri Lab Results In Sent: 06/16/2019   7:38 AM EDT To: Kathrynn Ducking, MD

## 2019-06-19 NOTE — Telephone Encounter (Signed)
LVM for Alexandria Harvey to call back so we could review lab results.

## 2019-06-19 NOTE — Telephone Encounter (Signed)
Alexandria Harvey called back (ok per dpr) and was able to advise on lab results partially but them Alexandria Harvey advised she was receiving an emergency call and would call back.

## 2019-06-19 NOTE — Telephone Encounter (Signed)
Alexandria Harvey returned the call and we completed the review of the labs for Ms. Brackedn. She verbalized understanding and had no further questions at this time.

## 2019-06-21 ENCOUNTER — Ambulatory Visit: Payer: Medicare Other | Admitting: Physical Therapy

## 2019-06-27 ENCOUNTER — Other Ambulatory Visit: Payer: Self-pay | Admitting: Physician Assistant

## 2019-06-27 DIAGNOSIS — Z7952 Long term (current) use of systemic steroids: Secondary | ICD-10-CM

## 2019-07-24 ENCOUNTER — Ambulatory Visit
Admission: RE | Admit: 2019-07-24 | Discharge: 2019-07-24 | Disposition: A | Discharge status: Home / Self Care | Payer: Medicare Other | Source: Ambulatory Visit | Attending: Neurology | Admitting: Neurology

## 2019-07-24 ENCOUNTER — Other Ambulatory Visit: Payer: Self-pay

## 2019-07-24 DIAGNOSIS — G3281 Cerebellar ataxia in diseases classified elsewhere: Secondary | ICD-10-CM | POA: Diagnosis not present

## 2019-07-24 MED ORDER — GADOBENATE DIMEGLUMINE 529 MG/ML IV SOLN
13.0000 mL | Freq: Once | INTRAVENOUS | Status: AC | PRN
  Administered 2019-07-24: 13 mL via INTRAVENOUS

## 2019-07-29 NOTE — Progress Notes (Signed)
There does appear to be significant cortical atrophy, likely explains most of the ventriculomegaly, however if the Sinemet does not offer benefit with the ability to ambulate, may consider spinal tap in the future.

## 2019-08-01 ENCOUNTER — Telehealth: Payer: Self-pay

## 2019-08-01 NOTE — Telephone Encounter (Signed)
Lvm for pt's EC (Sherri Stenson) to call back. Pt is in a women's facility and Ms. Sheran Lawless is involved in her care.

## 2019-08-01 NOTE — Telephone Encounter (Signed)
-----   Message from Penni Bombard, MD sent at 07/27/2019  5:24 PM EST ----- No acute findings. Atrophy and ventriculomegaly have progressed since 2016. Recommend to follow up with Dr. Jannifer Franklin to review causes (medication vs neurodegenerative). -VRP

## 2019-08-01 NOTE — Telephone Encounter (Signed)
Sherri returned my call and we reviewed results. She was ok to moving the appt up for Alexandria Harvey but she requested I contact her aid [place]. I attempted to reach monica and number listed on the chart and vm was full.

## 2019-08-02 NOTE — Telephone Encounter (Signed)
I contacted [facility] and we moved the pt's appt to 08/30/2019 at 12 pm.

## 2019-08-16 ENCOUNTER — Other Ambulatory Visit: Payer: Self-pay | Admitting: Neurology

## 2019-08-30 ENCOUNTER — Telehealth: Payer: Self-pay

## 2019-08-30 ENCOUNTER — Ambulatory Visit (INDEPENDENT_AMBULATORY_CARE_PROVIDER_SITE_OTHER): Payer: Medicare Other | Admitting: Neurology

## 2019-08-30 ENCOUNTER — Encounter: Payer: Self-pay | Admitting: Neurology

## 2019-08-30 ENCOUNTER — Other Ambulatory Visit: Payer: Self-pay

## 2019-08-30 VITALS — BP 114/79 | HR 81 | Temp 97.1°F | Ht 62.0 in | Wt 145.0 lb

## 2019-08-30 DIAGNOSIS — R269 Unspecified abnormalities of gait and mobility: Secondary | ICD-10-CM

## 2019-08-30 NOTE — Telephone Encounter (Signed)
Patient no show for appt today. 

## 2019-08-30 NOTE — Progress Notes (Signed)
Reason for visit: Gait disorder  Alexandria Harvey is an 38 y.o. female  History of present illness:  Alexandria Harvey is a 38 year old right-handed white female with a history of a significant psychiatric illness, she has been on chronic Clozaril therapy.  The patient has had some trouble walking since April 2020.  She is falling episodically, she will lean to the right and she will sometimes have her body get ahead of her feet and she starts speeding up with walking.  The patient was felt to have some features of Parkinson's disease, she was started on Sinemet taking 25/100 mg tablets 3 times daily.  The patient has tolerated this dose, her walking may have improved slightly, she has had 1 fall since last seen.  The patient does report occasional headaches, she has had some constipation problems but denies significant issues controlling the bladder.  She does have some troubles with memory, this may have worsened on the Sinemet.  She was in physical therapy but seemed to refuse the therapy and this was therefore stopped.  She returns to the office today for further evaluation.  MRI of the brain done shows some degree of ventriculomegaly but there has been a change in the size of the ventricles from 2016, they are clearly larger on this evaluation.  There is some cortical atrophy, however.  The question of normal pressure hydrocephalus has been entertained.  Past Medical History:  Diagnosis Date  . Diabetes mellitus without complication ([place])   . 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 Excessive anger   . GERD (gastroesophageal reflux disease)   . Hallucinations   . Hyperlipidemia   . Hypertension   . Mild mental retardation   . Schizophrenia ([place])   . Seizures ([place])   . Vitamin D deficiency     Past Surgical History:  Procedure Laterality Date  . NO PAST SURGERIES      Family History  Problem Relation Age of Onset  . Diabetes Mellitus II Mother   . Cancer Father     Social history:   reports that she has never smoked. She has never used smokeless tobacco. She reports that she does not drink alcohol or use drugs.    Allergies  Allergen Reactions  . Haloperidol Lactate Other (See Comments)    unk  . Other Rash    Strawberries  . Shrimp [Shellfish Allergy] Hives and Rash    Medications:  Prior to Admission medications   Medication Sig Start Date End Date Taking? Authorizing Provider  atenolol (TENORMIN) 50 MG tablet Take 1 tablet (50 mg total) by mouth daily. 01/25/15  Yes Hildred Priest, MD  atorvastatin (LIPITOR) 80 MG tablet Take 1 tablet (80 mg total) by mouth at bedtime. 01/25/15  Yes Hildred Priest, MD  atorvastatin (LIPITOR) 80 MG tablet Take by mouth. 06/23/19  Yes [provider]  benztropine (COGENTIN) 0.5 MG tablet Take 0.5 mg by mouth 2 (two) times daily.   Yes [provider]  benztropine (COGENTIN) 1 MG tablet Take by mouth. 06/23/19  Yes [provider]  Blood Glucose Monitoring Suppl (ONE TOUCH ULTRA 2) w/Device KIT by Does not apply route. Use to check blood sugar as directed.   Yes [provider]  CALCIUM PO Take 1 tablet by mouth daily. 445m tablets   Yes [provider]  carbidopa-levodopa (SINEMET IR) 25-100 MG tablet TAKE 1/2 TA 1 TABLET 3 TIMES DAILY. 08/16/19  Yes WKathrynn Ducking MD  cetirizine (ZYRTEC) 10 MG tablet  Take 10 mg by mouth at bedtime.   Yes [provider]  clonazePAM (KLONOPIN) 0.5 MG tablet Take 0.5 mg by mouth 2 (two) times daily as needed for anxiety (taking 1/2 tab BID).   Yes [provider]  clozapine (CLOZARIL) 200 MG tablet Take 200 mg by mouth 2 (two) times daily.   Yes [provider]  dicyclomine (BENTYL) 10 MG capsule Take 1 capsule (10 mg total) by mouth every 8 (eight) hours as needed for spasms. 06/29/17  Yes Armbruster, Carlota Raspberry, MD  divalproex (DEPAKOTE) 500 MG DR tablet Take 1 tablet (500 mg total) by mouth 3 (three)  times daily. 01/25/15  Yes Hildred Priest, MD  EPINEPHrine 0.3 mg/0.3 mL IJ SOAJ injection Inject 0.3 mg into the muscle as directed.   Yes [provider]  lisinopril (PRINIVIL,ZESTRIL) 2.5 MG tablet Take 1 tablet (2.5 mg total) by mouth daily. 01/25/15  Yes Hildred Priest, MD  LORazepam (ATIVAN) 0.5 MG tablet Take 0.5 mg by mouth every 8 (eight) hours.   Yes [provider]  lubiprostone (AMITIZA) 24 MCG capsule Take 24 mcg by mouth 2 (two) times daily with a meal.   Yes [provider]  medroxyPROGESTERone (DEPO-PROVERA) 150 MG/ML injection Inject 150 mg into the muscle every 3 (three) months.   Yes [provider]  metFORMIN (GLUCOPHAGE) 500 MG tablet Take 1,000 mg by mouth 2 (two) times daily with a meal.    Yes [provider]  omeprazole (PRILOSEC) 20 MG capsule Take 20 mg by mouth daily.   Yes [provider]  polyethylene glycol (MIRALAX / GLYCOLAX) packet Take 17 g by mouth 2 (two) times daily. 06/29/17  Yes Armbruster, Carlota Raspberry, MD  Simethicone 80 MG TABS Take by mouth.   Yes [provider]  Vitamin D, Ergocalciferol, (DRISDOL) 50000 units CAPS capsule Take 50,000 Units by mouth 2 (two) times a week.   Yes [provider]  cloZAPine (CLOZARIL) 100 MG tablet Take by mouth.    [provider]    ROS:  Out of a complete 14 system review of symptoms, the patient complains only of the following symptoms, and all other reviewed systems are negative.  Walking difficulty Memory problems, confusion  Blood pressure 114/79, pulse 81, temperature (!) 97.1 F (36.2 C), height _0  (1.575 m), weight 145 lb (65.8 kg).  Physical Exam  General: The patient is alert and cooperative at the time of the examination.  The patient is moderately obese.  Masking the face is seen.  Patient has a slight tendency to lean to the right.  Skin: No significant peripheral edema is noted.   Neurologic  Exam  Mental status: The patient is alert and oriented x 3 at the time of the examination. The patient has apparent normal recent and remote memory, with an apparently normal attention span and concentration ability.   Cranial nerves: Facial symmetry is present. Speech is normal, no aphasia or dysarthria is noted. Extraocular movements are full. Visual fields are full.  Motor: The patient has good strength in all 4 extremities.  Sensory examination: Soft touch sensation is symmetric on the face, arms, and legs.  Coordination: The patient has good finger-nose-finger and heel-to-shin bilaterally, but she does have some apraxia with use of the lower extremities.  Gait and station: The patient has the ability to walk independently, she has decreased arm swing.  Gait is slightly wide-based.  No tremors seen.  Tandem gait was not attempted.  Romberg is  unsteady.  The patient had a timed test for walking, she walked six 20 foot laps in 47 seconds.  Reflexes: Deep tendon reflexes are symmetric.   MRI brain 07/24/19:  IMPRESSION: This MRI of the brain with and without contrast shows the following: 1.   There are no acute findings and there is a normal enhancement pattern. 2.   Generalized cortical atrophy involving the peri-insular region medial temporal lobes. 3.   Ventriculomegaly out of proportion to the extent of atrophy consistent with communicating hydrocephalus.   * MRI scan images were reviewed online. I agree with the written report.    Assessment/Plan:  1.  Gait disorder  2.  Underlying psychiatric illness  The patient does seem to have some features of parkinsonism, the Sinemet may have helped her ability to walk slightly.  There is some indication of an enlargement of her ventricular system by MRI brain as compared to what was present for years ago.  For this reason, the patient will be sent for a high-volume lumbar puncture.  She will return within 24 hours after the lumbar  puncture for a timed walk test.  She will remain on Sinemet for now.  Jill Alexanders MD 08/30/2019 1:03 PM  [facility] [address] [place], [place] [address]  Phone [phone] Fax [phone]

## 2019-09-08 ENCOUNTER — Inpatient Hospital Stay (HOSPITAL_COMMUNITY)
Admission: EM | Admit: 2019-09-08 | Discharge: 2019-09-16 | DRG: 871 | Disposition: A | Discharge status: Home Health | Payer: Medicare Other | Attending: Internal Medicine | Admitting: Internal Medicine

## 2019-09-08 ENCOUNTER — Emergency Department (HOSPITAL_COMMUNITY): Payer: Medicare Other

## 2019-09-08 ENCOUNTER — Encounter (HOSPITAL_COMMUNITY): Payer: Self-pay | Admitting: Emergency Medicine

## 2019-09-08 ENCOUNTER — Other Ambulatory Visit: Payer: Self-pay

## 2019-09-08 DIAGNOSIS — D649 Anemia, unspecified: Secondary | ICD-10-CM | POA: Diagnosis present

## 2019-09-08 DIAGNOSIS — A4189 Other specified sepsis: Secondary | ICD-10-CM | POA: Diagnosis present

## 2019-09-08 DIAGNOSIS — E872 Acidosis, unspecified: Secondary | ICD-10-CM

## 2019-09-08 DIAGNOSIS — R7401 Elevation of levels of liver transaminase levels: Secondary | ICD-10-CM | POA: Diagnosis present

## 2019-09-08 DIAGNOSIS — F251 Schizoaffective disorder, depressive type: Secondary | ICD-10-CM | POA: Diagnosis present

## 2019-09-08 DIAGNOSIS — Z7984 Long term (current) use of oral hypoglycemic drugs: Secondary | ICD-10-CM

## 2019-09-08 DIAGNOSIS — Z79899 Other long term (current) drug therapy: Secondary | ICD-10-CM | POA: Diagnosis not present

## 2019-09-08 DIAGNOSIS — I10 Essential (primary) hypertension: Secondary | ICD-10-CM | POA: Diagnosis present

## 2019-09-08 DIAGNOSIS — J189 Pneumonia, unspecified organism: Secondary | ICD-10-CM

## 2019-09-08 DIAGNOSIS — D6959 Other secondary thrombocytopenia: Secondary | ICD-10-CM | POA: Diagnosis present

## 2019-09-08 DIAGNOSIS — J1282 Pneumonia due to coronavirus disease 2019: Secondary | ICD-10-CM | POA: Diagnosis present

## 2019-09-08 DIAGNOSIS — R0600 Dyspnea, unspecified: Secondary | ICD-10-CM

## 2019-09-08 DIAGNOSIS — D696 Thrombocytopenia, unspecified: Secondary | ICD-10-CM | POA: Diagnosis present

## 2019-09-08 DIAGNOSIS — F71 Moderate intellectual disabilities: Secondary | ICD-10-CM | POA: Diagnosis present

## 2019-09-08 DIAGNOSIS — G20 Parkinson's disease: Secondary | ICD-10-CM | POA: Diagnosis present

## 2019-09-08 DIAGNOSIS — E1165 Type 2 diabetes mellitus with hyperglycemia: Secondary | ICD-10-CM | POA: Diagnosis not present

## 2019-09-08 DIAGNOSIS — U071 COVID-19: Secondary | ICD-10-CM | POA: Diagnosis present

## 2019-09-08 DIAGNOSIS — L8990 Pressure ulcer of unspecified site, unspecified stage: Secondary | ICD-10-CM | POA: Insufficient documentation

## 2019-09-08 DIAGNOSIS — E119 Type 2 diabetes mellitus without complications: Secondary | ICD-10-CM | POA: Diagnosis not present

## 2019-09-08 DIAGNOSIS — E11649 Type 2 diabetes mellitus with hypoglycemia without coma: Secondary | ICD-10-CM | POA: Diagnosis present

## 2019-09-08 DIAGNOSIS — L89152 Pressure ulcer of sacral region, stage 2: Secondary | ICD-10-CM | POA: Diagnosis present

## 2019-09-08 DIAGNOSIS — E785 Hyperlipidemia, unspecified: Secondary | ICD-10-CM | POA: Diagnosis present

## 2019-09-08 DIAGNOSIS — G9340 Encephalopathy, unspecified: Secondary | ICD-10-CM | POA: Diagnosis present

## 2019-09-08 DIAGNOSIS — A419 Sepsis, unspecified organism: Secondary | ICD-10-CM | POA: Diagnosis not present

## 2019-09-08 DIAGNOSIS — K219 Gastro-esophageal reflux disease without esophagitis: Secondary | ICD-10-CM | POA: Diagnosis present

## 2019-09-08 DIAGNOSIS — E162 Hypoglycemia, unspecified: Secondary | ICD-10-CM | POA: Diagnosis not present

## 2019-09-08 DIAGNOSIS — J690 Pneumonitis due to inhalation of food and vomit: Secondary | ICD-10-CM | POA: Diagnosis present

## 2019-09-08 DIAGNOSIS — Z91013 Allergy to seafood: Secondary | ICD-10-CM

## 2019-09-08 DIAGNOSIS — E875 Hyperkalemia: Secondary | ICD-10-CM | POA: Diagnosis present

## 2019-09-08 DIAGNOSIS — J9601 Acute respiratory failure with hypoxia: Secondary | ICD-10-CM | POA: Diagnosis present

## 2019-09-08 DIAGNOSIS — G9341 Metabolic encephalopathy: Secondary | ICD-10-CM | POA: Diagnosis present

## 2019-09-08 DIAGNOSIS — R7989 Other specified abnormal findings of blood chemistry: Secondary | ICD-10-CM | POA: Diagnosis present

## 2019-09-08 LAB — I-STAT CHEM 8, ED
BUN: 27 mg/dL — ABNORMAL HIGH (ref 6–20)
Calcium, Ion: 1.1 mmol/L — ABNORMAL LOW (ref 1.15–1.40)
Chloride: 105 mmol/L (ref 98–111)
Creatinine, Ser: 0.7 mg/dL (ref 0.44–1.00)
Glucose, Bld: 73 mg/dL (ref 70–99)
HCT: 37 % (ref 36.0–46.0)
Hemoglobin: 12.6 g/dL (ref 12.0–15.0)
Potassium: 4.9 mmol/L (ref 3.5–5.1)
Sodium: 138 mmol/L (ref 135–145)
TCO2: 24 mmol/L (ref 22–32)

## 2019-09-08 LAB — CBC WITH DIFFERENTIAL/PLATELET
Abs Immature Granulocytes: 0.15 10*3/uL — ABNORMAL HIGH (ref 0.00–0.07)
Basophils Absolute: 0 10*3/uL (ref 0.0–0.1)
Basophils Relative: 0 %
Eosinophils Absolute: 0 10*3/uL (ref 0.0–0.5)
Eosinophils Relative: 0 %
HCT: 39.9 % (ref 36.0–46.0)
Hemoglobin: 12.9 g/dL (ref 12.0–15.0)
Immature Granulocytes: 2 %
Lymphocytes Relative: 16 %
Lymphs Abs: 1.2 10*3/uL (ref 0.7–4.0)
MCH: 29.6 pg (ref 26.0–34.0)
MCHC: 32.3 g/dL (ref 30.0–36.0)
MCV: 91.5 fL (ref 80.0–100.0)
Monocytes Absolute: 0.7 10*3/uL (ref 0.1–1.0)
Monocytes Relative: 9 %
Neutro Abs: 5.4 10*3/uL (ref 1.7–7.7)
Neutrophils Relative %: 73 %
Platelets: 129 10*3/uL — ABNORMAL LOW (ref 150–400)
RBC: 4.36 MIL/uL (ref 3.87–5.11)
RDW: 17.7 % — ABNORMAL HIGH (ref 11.5–15.5)
WBC: 7.5 10*3/uL (ref 4.0–10.5)
nRBC: 0 % (ref 0.0–0.2)

## 2019-09-08 LAB — PROCALCITONIN: Procalcitonin: 0.15 ng/mL

## 2019-09-08 LAB — I-STAT BETA HCG BLOOD, ED (MC, WL, AP ONLY): I-stat hCG, quantitative: <5 m[IU]/mL (ref <5)

## 2019-09-08 LAB — CBG MONITORING, ED
Glucose-Capillary: 68 mg/dL — ABNORMAL LOW (ref 70–99)
Glucose-Capillary: 127 mg/dL — ABNORMAL HIGH (ref 70–99)

## 2019-09-08 LAB — COMPREHENSIVE METABOLIC PANEL
ALT: 13 U/L (ref 0–44)
AST: 120 U/L — ABNORMAL HIGH (ref 15–41)
Albumin: 2.5 g/dL — ABNORMAL LOW (ref 3.5–5.0)
Alkaline Phosphatase: 43 U/L (ref 38–126)
Anion gap: 14 (ref 5–15)
BUN: 21 mg/dL — ABNORMAL HIGH (ref 6–20)
CO2: 20 mmol/L — ABNORMAL LOW (ref 22–32)
Calcium: 8.5 mg/dL — ABNORMAL LOW (ref 8.9–10.3)
Chloride: 101 mmol/L (ref 98–111)
Creatinine, Ser: 0.84 mg/dL (ref 0.44–1.00)
GFR calc Af Amer: >60 mL/min (ref >60)
GFR calc non Af Amer: >60 mL/min (ref >60)
Glucose, Bld: 93 mg/dL (ref 70–99)
Potassium: 5.2 mmol/L — ABNORMAL HIGH (ref 3.5–5.1)
Sodium: 135 mmol/L (ref 135–145)
Total Bilirubin: 0.6 mg/dL (ref 0.3–1.2)
Total Protein: 6.5 g/dL (ref 6.5–8.1)

## 2019-09-08 LAB — ETHANOL: Alcohol, Ethyl (B): <10 mg/dL (ref <10)

## 2019-09-08 LAB — LACTIC ACID, PLASMA: Lactic Acid, Venous: 4.2 mmol/L (ref 0.5–1.9)

## 2019-09-08 LAB — FERRITIN: Ferritin: 558 ng/mL — ABNORMAL HIGH (ref 11–307)

## 2019-09-08 LAB — LACTATE DEHYDROGENASE: LDH: 465 U/L — ABNORMAL HIGH (ref 98–192)

## 2019-09-08 LAB — FIBRINOGEN: Fibrinogen: 535 mg/dL — ABNORMAL HIGH (ref 210–475)

## 2019-09-08 LAB — D-DIMER, QUANTITATIVE: D-Dimer, Quant: 1.82 ug/mL-FEU — ABNORMAL HIGH (ref 0.00–0.50)

## 2019-09-08 LAB — C-REACTIVE PROTEIN: CRP: 5.5 mg/dL — ABNORMAL HIGH (ref <1.0)

## 2019-09-08 LAB — POC SARS CORONAVIRUS 2 AG -  ED: SARS Coronavirus 2 Ag: POSITIVE — AB

## 2019-09-08 LAB — TRIGLYCERIDES: Triglycerides: 194 mg/dL — ABNORMAL HIGH (ref <150)

## 2019-09-08 LAB — VALPROIC ACID LEVEL: Valproic Acid Lvl: 117 ug/mL — ABNORMAL HIGH (ref 50.0–100.0)

## 2019-09-08 MED ORDER — SODIUM CHLORIDE 0.9% FLUSH: 3.0000 mL | INTRAVENOUS | Status: DC | PRN

## 2019-09-08 MED ORDER — SODIUM CHLORIDE 0.9 % IV SOLN
3.0000 g | Freq: Three times a day (TID) | INTRAVENOUS | Status: DC
  Administered 2019-09-09 – 2019-09-11 (×8): 3 g via INTRAVENOUS
  Filled 2019-09-08 (×2): qty 8
  Filled 2019-09-08: qty 3
  Filled 2019-09-08 (×3): qty 8
  Filled 2019-09-08 (×2): qty 3
  Filled 2019-09-08 (×2): qty 8

## 2019-09-08 MED ORDER — SODIUM CHLORIDE 0.9% FLUSH
3.0000 mL | Freq: Two times a day (BID) | INTRAVENOUS | Status: DC
  Administered 2019-09-09: 3 mL via INTRAVENOUS

## 2019-09-08 MED ORDER — SODIUM CHLORIDE 0.9 % IV SOLN: 250.0000 mL | INTRAVENOUS | Status: DC | PRN

## 2019-09-08 MED ORDER — DEXTROSE 50 % IV SOLN
1.0000 | Freq: Once | INTRAVENOUS | Status: AC
  Administered 2019-09-08: 50 mL via INTRAVENOUS
  Filled 2019-09-08: qty 50

## 2019-09-08 MED ORDER — SODIUM CHLORIDE 0.9 % IV BOLUS
500.0000 mL | Freq: Once | INTRAVENOUS | Status: AC
  Administered 2019-09-08: 20:00:00 500 mL via INTRAVENOUS

## 2019-09-08 MED ORDER — ACETAMINOPHEN 650 MG RE SUPP
650.0000 mg | Freq: Once | RECTAL | Status: AC
  Administered 2019-09-08: 650 mg via RECTAL
  Filled 2019-09-08: qty 1

## 2019-09-08 NOTE — ED Triage Notes (Signed)
Pt to ED via [facility] from a group home after reported falling.  EMS st's caregiver at group home reports pt also fell a few days ago and has a bruise to right forehead from that fall.

## 2019-09-08 NOTE — ED Notes (Signed)
Pt able to drink sprite, and eat some apple sauce. Pt did not want to eat sandwich at this time.

## 2019-09-08 NOTE — ED Notes (Signed)
Positive POC covid result reported to Dr. Criss Alvine

## 2019-09-08 NOTE — Progress Notes (Signed)
Pharmacy Antibiotic Note  Alexandria Harvey is a 39 y.o. female admitted on 09/08/2019 with fall.  Pharmacy has been consulted for Unasyn dosing. WBC WNL. Renal function good. CXR concerning for aspiration. Point of care COVID +.   Plan: Unasyn 3g IV q8h Trend WBC, temp, renal function  F/U infectious work-up   Temp (24hrs), Avg:102.9 F (39.4 C), Min:102.9 F (39.4 C), Max:102.9 F (39.4 C)  Recent Labs  Lab 09/08/19 1919 09/08/19 2105 09/08/19 2111  WBC 7.5  --   --   CREATININE 0.84 0.70  --   LATICACIDVEN  --   --  4.2*    Estimated Creatinine Clearance: 85.7 mL/min (by C-G formula based on SCr of 0.7 mg/dL).    Allergies  Allergen Reactions  . Haloperidol Lactate Other (See Comments)    unk  . Other Rash    Strawberries  . Shrimp [Shellfish Allergy] Hives and Rash    Abran Duke, PharmD, BCPS Clinical Pharmacist Phone: [phone]

## 2019-09-08 NOTE — ED Notes (Addendum)
Bladder scanned pt to see if she was retaining urine. Bladder scanner showed .

## 2019-09-08 NOTE — ED Provider Notes (Signed)
[facility] EMERGENCY DEPARTMENT Provider Note   CSN: [id number] Arrival date & time: 09/08/19  1751  LEVEL 5 CAVEAT - MENTAL RETARDATION  History Chief Complaint  Patient presents with  . Fall    Alexandria Harvey is a 39 y.o. female.  HPI  39 year old female presents from group home via EMS.  History is very limited as EMS is no longer present, the patient has mental retardation, and initial call to guardian did not get answered.  The patient apparently came from the group home where she fell.  She fell a couple days ago as well.  Seems to be altered today though is unclear exactly in what manner given her chronic mental retardation and schizophrenia.  Otherwise, EMS reported the group home caregiver has Covid.  Past Medical History:  Diagnosis Date  . Diabetes mellitus without complication ([place])   . 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 Excessive anger   . GERD (gastroesophageal reflux disease)   . Hallucinations   . Hyperlipidemia   . Hypertension   . Mild mental retardation   . Schizophrenia ([place])   . Seizures ([place])   . Vitamin D deficiency     Patient Active Problem List   Diagnosis Date Noted  . Pneumonia 09/08/2019  . 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 Agitation   . 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 Schizoaffective disorder, depressive type ([place]) 01/18/2015  . Type 2 diabetes mellitus without complication ([place])   . ALLERGIC RHINITIS 12/19/2009  . DYSPEPSIA 09/26/2009  . CONSTIPATION 09/26/2009  . TINEA PEDIS 12/17/2008  . HYPERLIPIDEMIA 01/08/2007  . Essential hypertension 01/08/2007  . Seizure disorder ([place]) 01/08/2007    Past Surgical History:  Procedure Laterality Date  . NO PAST SURGERIES       OB History   No obstetric history on file.     Family History  Problem Relation Age of Onset  . Diabetes Mellitus II Mother   . Cancer Father     Social History   Tobacco Use  . Smoking status: Never Smoker  .  Smokeless tobacco: Never Used  Substance Use Topics  . Alcohol use: No  . Drug use: No    Home Medications Prior to Admission medications   Medication Sig Start Date End Date Taking? Authorizing Provider  atenolol (TENORMIN) 50 MG tablet Take 1 tablet (50 mg total) by mouth daily. 01/25/15   Hildred Priest, MD  atorvastatin (LIPITOR) 80 MG tablet Take 1 tablet (80 mg total) by mouth at bedtime. 01/25/15   Hildred Priest, MD  benztropine (COGENTIN) 0.5 MG tablet Take 0.5 mg by mouth 2 (two) times daily.    [provider]  Blood Glucose Monitoring Suppl (ONE TOUCH ULTRA 2) w/Device KIT by Does not apply route. Use to check blood sugar as directed.    [provider]  CALCIUM PO Take 1 tablet by mouth daily. 422m tablets    [provider]  carbidopa-levodopa (SINEMET IR) 25-100 MG tablet TAKE 1/2 TA 1 TABLET 3 TIMES DAILY. 08/16/19   WKathrynn Ducking MD  cetirizine (ZYRTEC) 10 MG tablet Take 10 mg by mouth at bedtime.    [provider]  clonazePAM (KLONOPIN) 0.5 MG tablet Take 0.5 mg by mouth 2 (two) times daily as needed for anxiety (taking 1/2 tab BID).    [provider]  cloZAPine (CLOZARIL) 100 MG tablet Take by mouth.    [provider]  clozapine (CLOZARIL) 200 MG tablet Take 200 mg by mouth  2 (two) times daily.    [provider]  dicyclomine (BENTYL) 10 MG capsule Take 1 capsule (10 mg total) by mouth every 8 (eight) hours as needed for spasms. 06/29/17   Armbruster, Carlota Raspberry, MD  divalproex (DEPAKOTE) 500 MG DR tablet Take 1 tablet (500 mg total) by mouth 3 (three) times daily. 01/25/15   Hildred Priest, MD  EPINEPHrine 0.3 mg/0.3 mL IJ SOAJ injection Inject 0.3 mg into the muscle as directed.    [provider]  lisinopril (PRINIVIL,ZESTRIL) 2.5 MG tablet Take 1 tablet (2.5 mg total) by mouth daily. 01/25/15   Hildred Priest, MD  LORazepam (ATIVAN) 0.5 MG tablet Take  0.5 mg by mouth every 8 (eight) hours.    [provider]  lubiprostone (AMITIZA) 24 MCG capsule Take 24 mcg by mouth 2 (two) times daily with a meal.    [provider]  medroxyPROGESTERone (DEPO-PROVERA) 150 MG/ML injection Inject 150 mg into the muscle every 3 (three) months.    [provider]  metFORMIN (GLUCOPHAGE) 500 MG tablet Take 1,000 mg by mouth 2 (two) times daily with a meal.     [provider]  omeprazole (PRILOSEC) 20 MG capsule Take 20 mg by mouth daily.    [provider]  polyethylene glycol (MIRALAX / GLYCOLAX) packet Take 17 g by mouth 2 (two) times daily. 06/29/17   Armbruster, Carlota Raspberry, MD  Simethicone 80 MG TABS Take by mouth.    [provider]  Vitamin D, Ergocalciferol, (DRISDOL) 50000 units CAPS capsule Take 50,000 Units by mouth 2 (two) times a week.    [provider]    Allergies    Haloperidol lactate, Other, and Shrimp [shellfish allergy]  Review of Systems   Review of Systems  Unable to perform ROS: Patient nonverbal    Physical Exam Updated Vital Signs BP (!) 130/98   Pulse (!) 107   Temp (!) 102.9 F (39.4 C) (Rectal)   Resp (!) 25   SpO2 90%   Physical Exam Vitals and nursing note reviewed.  Constitutional:      Appearance: She is well-developed.  HENT:     Head: Normocephalic.      Right Ear: External ear normal.     Left Ear: External ear normal.     Nose: Nose normal.  Eyes:     General:        Right eye: No discharge.        Left eye: No discharge.  Cardiovascular:     Rate and Rhythm: Normal rate and regular rhythm.     Heart sounds: Normal heart sounds.  Pulmonary:     Effort: Pulmonary effort is normal.     Breath sounds: Normal breath sounds.  Abdominal:     Palpations: Abdomen is soft.     Tenderness: There is no abdominal tenderness.  Musculoskeletal:     Cervical back: No spinous process tenderness or muscular tenderness.     Comments: There appears to be  some tenderness in right thigh on palpation but it's hard to localize  Skin:    General: Skin is warm and dry.  Neurological:     Comments: Patient has eyes closed. Moans to verbal/painful stimuli. Does not follow commands. Moves all 4 extremities to pain and spontaneously.  Psychiatric:        Mood and Affect: Mood is not anxious.     ED Results / Procedures / Treatments   Labs (all labs ordered are listed, but  only abnormal results are displayed) Labs Reviewed  COMPREHENSIVE METABOLIC PANEL - Abnormal; Notable for the following components:      Result Value   Potassium 5.2 (*)    CO2 20 (*)    BUN 21 (*)    Calcium 8.5 (*)    Albumin 2.5 (*)    AST 120 (*)    All other components within normal limits  CBC WITH DIFFERENTIAL/PLATELET - Abnormal; Notable for the following components:   RDW 17.7 (*)    Platelets 129 (*)    Abs Immature Granulocytes 0.15 (*)    All other components within normal limits  VALPROIC ACID LEVEL - Abnormal; Notable for the following components:   Valproic Acid Lvl 117 (*)    All other components within normal limits  LACTIC ACID, PLASMA - Abnormal; Notable for the following components:   Lactic Acid, Venous 4.2 (*)    All other components within normal limits  D-DIMER, QUANTITATIVE (NOT AT [facility]) - Abnormal; Notable for the following components:   D-Dimer, Quant 1.82 (*)    All other components within normal limits  FERRITIN - Abnormal; Notable for the following components:   Ferritin 558 (*)    All other components within normal limits  TRIGLYCERIDES - Abnormal; Notable for the following components:   Triglycerides 194 (*)    All other components within normal limits  FIBRINOGEN - Abnormal; Notable for the following components:   Fibrinogen 535 (*)    All other components within normal limits  C-REACTIVE PROTEIN - Abnormal; Notable for the following components:   CRP 5.5 (*)    All other components within normal limits  LACTATE DEHYDROGENASE -  Abnormal; Notable for the following components:   LDH 465 (*)    All other components within normal limits  I-STAT CHEM 8, ED - Abnormal; Notable for the following components:   BUN 27 (*)    Calcium, Ion 1.10 (*)    All other components within normal limits  CBG MONITORING, ED - Abnormal; Notable for the following components:   Glucose-Capillary 68 (*)    All other components within normal limits  POC SARS CORONAVIRUS 2 AG -  ED - Abnormal; Notable for the following components:   SARS Coronavirus 2 Ag POSITIVE (*)    All other components within normal limits  CBG MONITORING, ED - Abnormal; Notable for the following components:   Glucose-Capillary 127 (*)    All other components within normal limits  CULTURE, BLOOD (ROUTINE X 2)  CULTURE, BLOOD (ROUTINE X 2)  MRSA PCR SCREENING  ETHANOL  PROCALCITONIN  URINALYSIS, ROUTINE W REFLEX MICROSCOPIC  RAPID URINE DRUG SCREEN, HOSP PERFORMED  LACTIC ACID, PLASMA  I-STAT BETA HCG BLOOD, ED ([facility], [facility], [facility] ONLY)  CBG MONITORING, ED  CBG MONITORING, ED    EKG EKG Interpretation  Date/Time:  Friday September 08 2019 20:55:19 EST Ventricular Rate:  96 PR Interval:    QRS Duration: 89 QT Interval:  352 QTC Calculation: 445 R Axis:   70 Text Interpretation: Sinus rhythm Short PR interval Low voltage, precordial leads no significant change since 2016 Confirmed by Sherwood Gambler [phone]) on 09/08/2019 11:42:48 PM   Radiology CT Head Wo Contrast  Result Date: 09/08/2019 CLINICAL DATA:  Multiple falls, bruised right forehead EXAM: CT HEAD WITHOUT CONTRAST CT CERVICAL SPINE WITHOUT CONTRAST TECHNIQUE: Multidetector CT imaging of the head and cervical spine was performed following the standard protocol without intravenous contrast. Multiplanar CT image reconstructions of the cervical spine were  also generated. COMPARISON:  MR brain, 07/24/2019, CT cervical spine, 02/07/2019 FINDINGS: CT HEAD FINDINGS Brain: No evidence of acute infarction, hemorrhage,  hydrocephalus, extra-axial collection or mass lesion/mass effect. Vascular: No hyperdense vessel or unexpected calcification. Skull: Hyperostosis. Negative for fracture or focal lesion. Sinuses/Orbits: No acute finding. Other: Soft tissue contusion of the forehead. CT CERVICAL SPINE FINDINGS Alignment: Normal. Skull base and vertebrae: No acute fracture. No primary bone lesion or focal pathologic process. Soft tissues and spinal canal: No prevertebral fluid or swelling. No visible canal hematoma. Disc levels:  Intact. Upper chest: Extensive heterogeneous airspace opacity of the included right upper lobe. Other: None. IMPRESSION: 1. No acute intracranial pathology. 2. Soft tissue contusion of the forehead. 3. No fracture or static subluxation of the cervical spine. 4. Extensive heterogeneous airspace opacity of the included right upper lobe, concerning for pneumonia. Electronically Signed   By: Eddie Candle M.D.   On: 09/08/2019 20:56   CT Cervical Spine Wo Contrast  Result Date: 09/08/2019 CLINICAL DATA:  Multiple falls, bruised right forehead EXAM: CT HEAD WITHOUT CONTRAST CT CERVICAL SPINE WITHOUT CONTRAST TECHNIQUE: Multidetector CT imaging of the head and cervical spine was performed following the standard protocol without intravenous contrast. Multiplanar CT image reconstructions of the cervical spine were also generated. COMPARISON:  MR brain, 07/24/2019, CT cervical spine, 02/07/2019 FINDINGS: CT HEAD FINDINGS Brain: No evidence of acute infarction, hemorrhage, hydrocephalus, extra-axial collection or mass lesion/mass effect. Vascular: No hyperdense vessel or unexpected calcification. Skull: Hyperostosis. Negative for fracture or focal lesion. Sinuses/Orbits: No acute finding. Other: Soft tissue contusion of the forehead. CT CERVICAL SPINE FINDINGS Alignment: Normal. Skull base and vertebrae: No acute fracture. No primary bone lesion or focal pathologic process. Soft tissues and spinal canal: No  prevertebral fluid or swelling. No visible canal hematoma. Disc levels:  Intact. Upper chest: Extensive heterogeneous airspace opacity of the included right upper lobe. Other: None. IMPRESSION: 1. No acute intracranial pathology. 2. Soft tissue contusion of the forehead. 3. No fracture or static subluxation of the cervical spine. 4. Extensive heterogeneous airspace opacity of the included right upper lobe, concerning for pneumonia. Electronically Signed   By: Eddie Candle M.D.   On: 09/08/2019 20:56   DG Pelvis Portable  Result Date: 09/08/2019 CLINICAL DATA:  Acute pain due to trauma. EXAM: PORTABLE PELVIS 1-2 VIEWS COMPARISON:  None. FINDINGS: There is no acute displaced fracture or dislocation. There is mild bilateral hip osteoarthritis. IMPRESSION: Negative. Electronically Signed   By: Constance Holster M.D.   On: 09/08/2019 22:08   DG Chest Portable 1 View  Result Date: 09/08/2019 CLINICAL DATA:  Pain status post fall EXAM: PORTABLE CHEST 1 VIEW COMPARISON:  February 07, 2019 FINDINGS: There are new airspace opacities scattered throughout the right lung zone. The left lung field appears essentially clear. There may be some right-sided volume loss. There is no pneumothorax. No large pleural effusion. The heart size is stable from prior study. There is no acute osseous abnormality. IMPRESSION: 1. No acute traumatic abnormality. 2. Scattered airspace opacities primarily throughout the right lung field is concerning for pneumonia or aspiration. Follow-up to radiologic resolution is recommended. Electronically Signed   By: Constance Holster M.D.   On: 09/08/2019 22:10   DG Femur Portable Min 2 Views Right  Result Date: 09/08/2019 CLINICAL DATA:  Pain EXAM: RIGHT FEMUR PORTABLE 2 VIEW COMPARISON:  None. FINDINGS: There is no evidence of fracture or other focal bone lesions. Soft tissues are unremarkable. There is mild right hip osteoarthritis. IMPRESSION:  Negative. Electronically Signed   By: Constance Holster  M.D.   On: 09/08/2019 22:08    Procedures .Critical Care Performed by: Sherwood Gambler, MD Authorized by: Sherwood Gambler, MD   Critical care provider statement:    Critical care time (minutes):  30   Critical care time was exclusive of:  Separately billable procedures and treating other patients   Critical care was necessary to treat or prevent imminent or life-threatening deterioration of the following conditions:  Shock and respiratory failure   Critical care was time spent personally by me on the following activities:  Discussions with consultants, evaluation of patient's response to treatment, examination of patient, ordering and performing treatments and interventions, ordering and review of laboratory studies, ordering and review of radiographic studies, pulse oximetry, re-evaluation of patient's condition, obtaining history from patient or surrogate and review of old charts   (including critical care time)  Medications Ordered in ED Medications  sodium chloride flush (NS) 0.9 % injection 3 mL (has no administration in time range)  sodium chloride flush (NS) 0.9 % injection 3 mL (has no administration in time range)  0.9 %  sodium chloride infusion (has no administration in time range)  Ampicillin-Sulbactam (UNASYN) 3 g in sodium chloride 0.9 % 100 mL IVPB (has no administration in time range)  acetaminophen (TYLENOL) suppository 650 mg (650 mg Rectal Given 09/08/19 2017)  sodium chloride 0.9 % bolus 500 mL (0 mLs Intravenous Stopped 09/08/19 2200)  dextrose 50 % solution 50 mL (50 mLs Intravenous Given 09/08/19 2202)    ED Course  I have reviewed the triage vital signs and the nursing notes.  Pertinent labs & imaging results that were available during my care of the patient were reviewed by me and considered in my medical decision making (see chart for details).    MDM Rules/Calculators/A&P                      Patient found to test positive for the novel coronavirus.  History  is obviously very limited.  I did discuss with Juventino Slovak, legal guardian, and the mental status she has now is not her typical.  She will talk and make conversations.  She was found to have some hypoglycemia and the mental status did seem to improve a little bit with D50.  Possibly the fever itself is making her feel poorly and have depressed mental status.  Majority of her symptoms are probably from the coronavirus infection though she also does note to have a lactic acid elevated.  Procalcitonin is negative so bacterial illness is less likely.  Will defer antibiotics to admitting team.  Given small bolus of fluids.  Dr. Myna Hidalgo to admit.  Ivon Roedel was evaluated in Emergency Department on 09/09/2019 for the symptoms described in the history of present illness. She was evaluated in the context of the global COVID-19 pandemic, which necessitated consideration that the patient might be at risk for infection with the SARS-CoV-2 virus that causes COVID-19. Institutional protocols and algorithms that pertain to the evaluation of patients at risk for COVID-19 are in a state of rapid change based on information released by regulatory bodies including the CDC and federal and state organizations. These policies and algorithms were followed during the patient's care in the ED.  Final Clinical Impression(s) / ED Diagnoses Final diagnoses:  COVID-19 virus infection  Hypoglycemia  Lactic acidosis    Rx / DC Orders ED Discharge Orders    None  Sherwood Gambler, MD 09/09/19 [phone]

## 2019-09-08 NOTE — ED Notes (Signed)
Pt to CT at this time.

## 2019-09-09 ENCOUNTER — Encounter (HOSPITAL_COMMUNITY): Payer: Self-pay | Admitting: Family Medicine

## 2019-09-09 DIAGNOSIS — D696 Thrombocytopenia, unspecified: Secondary | ICD-10-CM | POA: Diagnosis present

## 2019-09-09 DIAGNOSIS — R7989 Other specified abnormal findings of blood chemistry: Secondary | ICD-10-CM | POA: Diagnosis present

## 2019-09-09 DIAGNOSIS — G9340 Encephalopathy, unspecified: Secondary | ICD-10-CM | POA: Diagnosis present

## 2019-09-09 DIAGNOSIS — R7401 Elevation of levels of liver transaminase levels: Secondary | ICD-10-CM | POA: Diagnosis present

## 2019-09-09 DIAGNOSIS — U071 COVID-19: Secondary | ICD-10-CM | POA: Diagnosis present

## 2019-09-09 LAB — CBC
HCT: 32.2 % — ABNORMAL LOW (ref 36.0–46.0)
Hemoglobin: 10.2 g/dL — ABNORMAL LOW (ref 12.0–15.0)
MCH: 29.4 pg (ref 26.0–34.0)
MCHC: 31.7 g/dL (ref 30.0–36.0)
MCV: 92.8 fL (ref 80.0–100.0)
Platelets: 124 10*3/uL — ABNORMAL LOW (ref 150–400)
RBC: 3.47 MIL/uL — ABNORMAL LOW (ref 3.87–5.11)
RDW: 17.1 % — ABNORMAL HIGH (ref 11.5–15.5)
WBC: 11.9 10*3/uL — ABNORMAL HIGH (ref 4.0–10.5)
nRBC: 0 % (ref 0.0–0.2)

## 2019-09-09 LAB — BASIC METABOLIC PANEL
Anion gap: 11 (ref 5–15)
BUN: 14 mg/dL (ref 6–20)
CO2: 20 mmol/L — ABNORMAL LOW (ref 22–32)
Calcium: 7.7 mg/dL — ABNORMAL LOW (ref 8.9–10.3)
Chloride: 108 mmol/L (ref 98–111)
Creatinine, Ser: 0.56 mg/dL (ref 0.44–1.00)
GFR calc Af Amer: >60 mL/min (ref >60)
GFR calc non Af Amer: >60 mL/min (ref >60)
Glucose, Bld: 127 mg/dL — ABNORMAL HIGH (ref 70–99)
Potassium: 4.6 mmol/L (ref 3.5–5.1)
Sodium: 139 mmol/L (ref 135–145)

## 2019-09-09 LAB — URINALYSIS, ROUTINE W REFLEX MICROSCOPIC
Bacteria, UA: NONE SEEN
Glucose, UA: NEGATIVE mg/dL
Hgb urine dipstick: NEGATIVE
Ketones, ur: 20 mg/dL — AB
Leukocytes,Ua: NEGATIVE
Nitrite: NEGATIVE
Protein, ur: 30 mg/dL — AB
Specific Gravity, Urine: 1.035 — ABNORMAL HIGH (ref 1.005–1.030)
pH: 5 (ref 5.0–8.0)

## 2019-09-09 LAB — POCT I-STAT 7, (LYTES, BLD GAS, ICA,H+H)
Acid-base deficit: 3 mmol/L — ABNORMAL HIGH (ref 0.0–2.0)
Bicarbonate: 20.6 mmol/L (ref 20.0–28.0)
Calcium, Ion: 1.18 mmol/L (ref 1.15–1.40)
HCT: 31 % — ABNORMAL LOW (ref 36.0–46.0)
Hemoglobin: 10.5 g/dL — ABNORMAL LOW (ref 12.0–15.0)
O2 Saturation: 95 %
Potassium: 4.3 mmol/L (ref 3.5–5.1)
Sodium: 139 mmol/L (ref 135–145)
TCO2: 22 mmol/L (ref 22–32)
pCO2 arterial: 31 mmHg — ABNORMAL LOW (ref 32.0–48.0)
pH, Arterial: 7.431 (ref 7.350–7.450)
pO2, Arterial: 70 mmHg — ABNORMAL LOW (ref 83.0–108.0)

## 2019-09-09 LAB — CBG MONITORING, ED
Glucose-Capillary: 107 mg/dL — ABNORMAL HIGH (ref 70–99)
Glucose-Capillary: 110 mg/dL — ABNORMAL HIGH (ref 70–99)
Glucose-Capillary: 112 mg/dL — ABNORMAL HIGH (ref 70–99)
Glucose-Capillary: 141 mg/dL — ABNORMAL HIGH (ref 70–99)
Glucose-Capillary: 142 mg/dL — ABNORMAL HIGH (ref 70–99)
Glucose-Capillary: 145 mg/dL — ABNORMAL HIGH (ref 70–99)
Glucose-Capillary: 85 mg/dL (ref 70–99)
Glucose-Capillary: 95 mg/dL (ref 70–99)

## 2019-09-09 LAB — HIV ANTIBODY (ROUTINE TESTING W REFLEX): HIV Screen 4th Generation wRfx: NONREACTIVE

## 2019-09-09 LAB — ABO/RH: ABO/RH(D): A POS

## 2019-09-09 LAB — LACTIC ACID, PLASMA
Lactic Acid, Venous: 4.5 mmol/L (ref 0.5–1.9)
Lactic Acid, Venous: 3.5 mmol/L (ref 0.5–1.9)
Lactic Acid, Venous: 2.1 mmol/L (ref 0.5–1.9)
Lactic Acid, Venous: 2.5 mmol/L (ref 0.5–1.9)

## 2019-09-09 LAB — RAPID URINE DRUG SCREEN, HOSP PERFORMED
Amphetamines: NOT DETECTED
Barbiturates: NOT DETECTED
Benzodiazepines: NOT DETECTED
Cocaine: NOT DETECTED
Opiates: NOT DETECTED
Tetrahydrocannabinol: NOT DETECTED

## 2019-09-09 LAB — HEMOGLOBIN A1C
Hgb A1c MFr Bld: 6.3 % — ABNORMAL HIGH (ref 4.8–5.6)
Mean Plasma Glucose: 134.11 mg/dL

## 2019-09-09 LAB — ACETAMINOPHEN LEVEL: Acetaminophen (Tylenol), Serum: <10 ug/mL — ABNORMAL LOW (ref 10–30)

## 2019-09-09 LAB — GLUCOSE, CAPILLARY: Glucose-Capillary: 81 mg/dL (ref 70–99)

## 2019-09-09 LAB — MRSA PCR SCREENING: MRSA by PCR: NEGATIVE

## 2019-09-09 MED ORDER — SODIUM CHLORIDE 0.9 % IV SOLN
200.0000 mg | Freq: Once | INTRAVENOUS | Status: AC
  Administered 2019-09-09: 03:00:00 200 mg via INTRAVENOUS
  Filled 2019-09-09: qty 200

## 2019-09-09 MED ORDER — LUBIPROSTONE 24 MCG PO CAPS
24.0000 ug | ORAL_CAPSULE | Freq: Every day | ORAL | Status: DC
  Administered 2019-09-10 – 2019-09-16 (×7): 24 ug via ORAL
  Filled 2019-09-09 (×10): qty 1

## 2019-09-09 MED ORDER — SODIUM CHLORIDE 0.9 % IV SOLN
100.0000 mg | Freq: Every day | INTRAVENOUS | Status: AC
  Administered 2019-09-10 – 2019-09-13 (×4): 100 mg via INTRAVENOUS
  Filled 2019-09-09 (×4): qty 20

## 2019-09-09 MED ORDER — DEXAMETHASONE SODIUM PHOSPHATE 10 MG/ML IJ SOLN
6.0000 mg | INTRAMUSCULAR | Status: DC
  Administered 2019-09-09 – 2019-09-11 (×4): 6 mg via INTRAVENOUS
  Filled 2019-09-09 (×4): qty 1

## 2019-09-09 MED ORDER — INSULIN ASPART 100 UNIT/ML ~~LOC~~ SOLN: 0.0000 [IU] | Freq: Three times a day (TID) | SUBCUTANEOUS | Status: DC

## 2019-09-09 MED ORDER — ONDANSETRON HCL 4 MG/2ML IJ SOLN: 4.0000 mg | Freq: Four times a day (QID) | INTRAMUSCULAR | Status: DC | PRN

## 2019-09-09 MED ORDER — SODIUM CHLORIDE 0.9% FLUSH
3.0000 mL | Freq: Two times a day (BID) | INTRAVENOUS | Status: DC
  Administered 2019-09-09 – 2019-09-15 (×12): 3 mL via INTRAVENOUS

## 2019-09-09 MED ORDER — ONDANSETRON HCL 4 MG PO TABS: 4.0000 mg | ORAL_TABLET | Freq: Four times a day (QID) | ORAL | Status: DC | PRN

## 2019-09-09 MED ORDER — CARBIDOPA-LEVODOPA 25-100 MG PO TABS
1.0000 | ORAL_TABLET | Freq: Three times a day (TID) | ORAL | Status: DC
  Administered 2019-09-09 – 2019-09-16 (×18): 1 via ORAL
  Filled 2019-09-09 (×27): qty 1

## 2019-09-09 MED ORDER — DIVALPROEX SODIUM 500 MG PO DR TAB
1000.0000 mg | DELAYED_RELEASE_TABLET | Freq: Two times a day (BID) | ORAL | Status: DC
  Administered 2019-09-10 – 2019-09-16 (×12): 1000 mg via ORAL
  Filled 2019-09-09 (×17): qty 2

## 2019-09-09 MED ORDER — PANTOPRAZOLE SODIUM 40 MG PO TBEC
40.0000 mg | DELAYED_RELEASE_TABLET | Freq: Every day | ORAL | Status: DC
  Administered 2019-09-10 – 2019-09-16 (×7): 40 mg via ORAL
  Filled 2019-09-09 (×8): qty 1

## 2019-09-09 MED ORDER — SENNOSIDES-DOCUSATE SODIUM 8.6-50 MG PO TABS: 1.0000 | ORAL_TABLET | Freq: Every evening | ORAL | Status: DC | PRN

## 2019-09-09 MED ORDER — VITAMIN D (ERGOCALCIFEROL) 1.25 MG (50000 UNIT) PO CAPS
50000.0000 [IU] | ORAL_CAPSULE | ORAL | Status: DC
  Administered 2019-09-12 – 2019-09-15 (×2): 50000 [IU] via ORAL
  Filled 2019-09-09 (×2): qty 1

## 2019-09-09 MED ORDER — NYSTATIN 100000 UNIT/GM EX CREA
1.0000 "application " | TOPICAL_CREAM | Freq: Two times a day (BID) | CUTANEOUS | Status: DC | PRN
  Filled 2019-09-09: qty 15

## 2019-09-09 MED ORDER — ACETAMINOPHEN 325 MG PO TABS: 650.0000 mg | ORAL_TABLET | Freq: Four times a day (QID) | ORAL | Status: DC | PRN

## 2019-09-09 MED ORDER — LACTATED RINGERS IV SOLN: INTRAVENOUS | Status: DC

## 2019-09-09 MED ORDER — ACETAMINOPHEN 650 MG RE SUPP
650.0000 mg | Freq: Once | RECTAL | Status: DC
  Filled 2019-09-09: qty 1

## 2019-09-09 MED ORDER — ENOXAPARIN SODIUM 40 MG/0.4ML ~~LOC~~ SOLN
40.0000 mg | SUBCUTANEOUS | Status: DC
  Administered 2019-09-09 – 2019-09-16 (×8): 40 mg via SUBCUTANEOUS
  Filled 2019-09-09 (×8): qty 0.4

## 2019-09-09 MED ORDER — LACTATED RINGERS IV BOLUS
500.0000 mL | Freq: Once | INTRAVENOUS | Status: AC
  Administered 2019-09-09: 500 mL via INTRAVENOUS

## 2019-09-09 MED ORDER — SODIUM CHLORIDE 0.9 % IV SOLN: 200.0000 mg | Freq: Once | INTRAVENOUS | Status: DC

## 2019-09-09 MED ORDER — SODIUM CHLORIDE 0.9 % IV SOLN: 100.0000 mg | Freq: Every day | INTRAVENOUS | Status: DC

## 2019-09-09 MED ORDER — DICYCLOMINE HCL 10 MG PO CAPS
10.0000 mg | ORAL_CAPSULE | Freq: Three times a day (TID) | ORAL | Status: DC | PRN
  Filled 2019-09-09: qty 1

## 2019-09-09 MED ORDER — SODIUM CHLORIDE 0.9 % IV BOLUS
1000.0000 mL | Freq: Once | INTRAVENOUS | Status: AC
  Administered 2019-09-09: 1000 mL via INTRAVENOUS

## 2019-09-09 MED ORDER — SODIUM CHLORIDE 0.9 % IV BOLUS
500.0000 mL | Freq: Once | INTRAVENOUS | Status: AC
  Administered 2019-09-09: 10:00:00 500 mL via INTRAVENOUS

## 2019-09-09 MED ORDER — BENZTROPINE MESYLATE 0.5 MG PO TABS
1.0000 mg | ORAL_TABLET | Freq: Two times a day (BID) | ORAL | Status: DC
  Administered 2019-09-09 – 2019-09-16 (×13): 1 mg via ORAL
  Filled 2019-09-09 (×2): qty 2
  Filled 2019-09-09: qty 1
  Filled 2019-09-09 (×9): qty 2
  Filled 2019-09-09: qty 1
  Filled 2019-09-09 (×3): qty 2

## 2019-09-09 NOTE — ED Notes (Addendum)
Pt. Only moaned to her name when I attempted arouse her. Pt. Reconnected to BP and reassessed. Infusions running with no problems

## 2019-09-09 NOTE — ED Notes (Signed)
admitting Provider at bedside. 

## 2019-09-09 NOTE — ED Notes (Signed)
Per conversation with guardian [name]  , Social work [employer] , [facility].   Pt baseline is A/O X 4. Pt likes and does speak regularly. Pt was speaking before she left the house with EMS.   Sheri: [phone] Social work/ guardian. Makes medical decscion  [facility] : Sherrell Puller [phone]: knows her daily routine

## 2019-09-09 NOTE — ED Notes (Signed)
Dr. Antionette Char aware of no urine output, no in and out at this time, re bladder scan at 0300

## 2019-09-09 NOTE — ED Notes (Signed)
MD notified of Lactic levels

## 2019-09-09 NOTE — ED Notes (Signed)
[facility] (Already on List for [facility]) called @ 1702-per Asher Muir, RN called by Marylene Land

## 2019-09-09 NOTE — ED Notes (Addendum)
Pt.s eyes open for first time. Still confused, does not know name or location. When asked if she knew her name she said yes. Had to move to 2 L, stats were still dipping into high 80's low 90's intermitently. BP remains High 120/105 and Tacy at 107.

## 2019-09-09 NOTE — ED Notes (Signed)
Pt unable to be aroused. Sternal rub needed to have any response. MD paged and spoke with. Orders given and blood drawn as requested.

## 2019-09-09 NOTE — ED Notes (Signed)
Pt. Continues to stat at 88-89%. Attempted to reposition but was not effective. Put on 1 L up to 92% steady

## 2019-09-09 NOTE — ED Notes (Signed)
Breakfast Ordered 

## 2019-09-09 NOTE — ED Notes (Signed)
Lunch Tray Ordered @ 1128. 

## 2019-09-09 NOTE — Progress Notes (Addendum)
PROGRESS NOTE    Shekela Goodridge  PJA:[id number] DOB: [birth year] DOA: 09/08/2019 PCP: Gilda Crease, MD   Brief Narrative: 39 year old with past medical history significant for a schizoaffective disorder, intellectual disability, diabetes type 2, hypertension and parkinsonism who presented from group home with decreased level of consciousness after a fall few days prior to admission.  At baseline patient can be agitated at times where he is usually able to have a conversation, but lately she has not been speaking much.  Patient was found to be hypoxic oxygen saturation in the 80s.  CT head was negative for acute intracranial abnormality.  Chest x-ray showed scattered airspace opacity on the right concerning for pneumonia or aspiration.  Mild elevation of transaminases and thrombocytopenia.  Covid antigen test positive.    Assessment & Plan:   Principal Problem:   Sepsis due to pneumonia [facility]) Active Problems:   Hypoglycemia   Type 2 diabetes mellitus without complication (HCC)   Schizoaffective disorder, depressive type (HCC)   Moderate intellectual disabilities   COVID-19 virus infection   Elevated AST (SGOT)   Thrombocytopenia (HCC)   Positive D dimer  1-Sepsis Secondary to PNA, Covid 19, ? Aspiration PNA: Patient presented with hypoxemia, altered mental status fever tachycardia, lactic acidosis. COVID-19 positive. -Continue with Decadron and Remdesivir.  -Would continue Unasyn for aspiration PNA, she is at higg risk due to MS>  -She has received IV bolus. Continue with IV fluids.  -follow lactic acid trend.  -Follow legionella and strep antigen in urine.  -Blood culture no growth to date.   2-Acute Hypoxic Respiratory Failure, secondary to Covid 19 PNA;  Currently on RA, but had initial Oxygen sat at 85 %. Continue with Remdesivir and Dexamethasone.  Transfer to [facility].  COVID-19 Labs  Recent Labs    09/08/19 2110 09/08/19 2142  DDIMER 1.82*  --   FERRITIN  558*  --   LDH  --  465*  CRP 5.5*  --     No results found for: SARSCOV2NAA   Acute metabolic Encephalopathy;  CT head negative for acute intracranial pathology. Contusion of forehead.  Likely related to fever, infectious process.  Support care.   DM type II, hypoglycemia Monitor CBG.  Hold metformin.   -Transaminases; related to sepsis, covid, follow trend.   -Elevated D dimer; Follow trend.  Lovenox for DVT prophylaxis.   Thrombocytopenia Related to viral illness, follow trend.   Schizoaffective Disorder; Mood disorder Resume home medications.   HTN;   hold BP medications in setting of sepsis.   Lactic acidosis, Metabolic acidosis.  IV bolus.  IV fluids.   Hyperkalemia; resolved.    Estimated body mass index is 26.52 kg/m as calculated from the following:   Height as of 08/30/19: 5\' 2"  (1.575 m).   Weight as of 08/30/19: 65.8 kg.   DVT prophylaxis: Lovenox Code Status: Full code Family Communication: Unable to contact legal guardian.  Disposition Plan: transfer to [facility], remain in hospital for treatment of cavid and AMS>  Consultants:   none  Procedures:   none  Antimicrobials:  Unasyn   Subjective: Lethargic, open eyes to voice.   Objective: Vitals:   09/09/19 0500 09/09/19 0515 09/09/19 0530 09/09/19 0600  BP: 120/69 (!) 120/105 113/65 114/60  Pulse: (!) 106 (!) 109 (!) 105 (!) 103  Resp: (!) 21 20 20 18   Temp:      TempSrc:      SpO2: 93% 91% 95% 95%    Intake/Output Summary (Last 24 hours)  at 09/09/2019 0751 Last data filed at 09/08/2019 2250 Gross per 24 hour  Intake 500 ml  Output --  Net 500 ml   There were no vitals filed for this visit.  Examination:  General exam: lethargic Respiratory system: Clear to auscultation. Respiratory effort normal. Cardiovascular system: S1 & S2 heard, RRR. No JVD, murmurs, rubs, gallops or clicks. No pedal edema. Gastrointestinal system: Abdomen is nondistended, soft and nontender. No  organomegaly or masses felt. Normal bowel sounds heard. Central nervous system: Lethargic Extremities: no edema Skin: No rashes, lesions or ulcers   Data Reviewed: I have personally reviewed following labs and imaging studies  CBC: Recent Labs  Lab 09/08/19 1919 09/08/19 2105  WBC 7.5  --   NEUTROABS 5.4  --   HGB 12.9 12.6  HCT 39.9 37.0  MCV 91.5  --   PLT 129*  --    Basic Metabolic Panel: Recent Labs  Lab 09/08/19 1919 09/08/19 2105  NA 135 138  K 5.2* 4.9  CL 101 105  CO2 20*  --   GLUCOSE 93 73  BUN 21* 27*  CREATININE 0.84 0.70  CALCIUM 8.5*  --    GFR: Estimated Creatinine Clearance: 85.7 mL/min (by C-G formula based on SCr of 0.7 mg/dL). Liver Function Tests: Recent Labs  Lab 09/08/19 1919  AST 120*  ALT 13  ALKPHOS 43  BILITOT 0.6  PROT 6.5  ALBUMIN 2.5*   No results for input(s): LIPASE, AMYLASE in the last 168 hours. No results for input(s): AMMONIA in the last 168 hours. Coagulation Profile: No results for input(s): INR, PROTIME in the last 168 hours. Cardiac Enzymes: No results for input(s): CKTOTAL, CKMB, CKMBINDEX, TROPONINI in the last 168 hours. BNP (last 3 results) No results for input(s): PROBNP in the last 8760 hours. HbA1C: Recent Labs    09/09/19 0432  HGBA1C 6.3*   CBG: Recent Labs  Lab 09/09/19 0018 09/09/19 0157 09/09/19 0346 09/09/19 0430 09/09/19 0720  GLUCAP 141* 95 107* 110* 145*   Lipid Profile: Recent Labs    09/08/19 2110  TRIG 194*   Thyroid Function Tests: No results for input(s): TSH, T4TOTAL, FREET4, T3FREE, THYROIDAB in the last 72 hours. Anemia Panel: Recent Labs    09/08/19 2110  FERRITIN 558*   Sepsis Labs: Recent Labs  Lab 09/08/19 2111 09/08/19 2142 09/09/19 0335  PROCALCITON  --  0.15  --   LATICACIDVEN 4.2*  --  4.5*    Recent Results (from the past 240 hour(s))  MRSA PCR Screening     Status: None   Collection Time: 09/09/19 12:30 AM   Specimen: Nasal Mucosa; Nasopharyngeal    Result Value Ref Range Status   MRSA by PCR NEGATIVE NEGATIVE Final    Comment:        The GeneXpert MRSA Assay (FDA approved for NASAL specimens only), is one component of a comprehensive MRSA colonization surveillance program. It is not intended to diagnose MRSA infection nor to guide or monitor treatment for MRSA infections. Performed at [facility], [place] [address] [place], [place] [address]          Radiology Studies: CT Head Wo Contrast  Result Date: 09/08/2019 CLINICAL DATA:  Multiple falls, bruised right forehead EXAM: CT HEAD WITHOUT CONTRAST CT CERVICAL SPINE WITHOUT CONTRAST TECHNIQUE: Multidetector CT imaging of the head and cervical spine was performed following the standard protocol without intravenous contrast. Multiplanar CT image reconstructions of the cervical spine were also generated. COMPARISON:  MR brain,  07/24/2019, CT cervical spine, 02/07/2019 FINDINGS: CT HEAD FINDINGS Brain: No evidence of acute infarction, hemorrhage, hydrocephalus, extra-axial collection or mass lesion/mass effect. Vascular: No hyperdense vessel or unexpected calcification. Skull: Hyperostosis. Negative for fracture or focal lesion. Sinuses/Orbits: No acute finding. Other: Soft tissue contusion of the forehead. CT CERVICAL SPINE FINDINGS Alignment: Normal. Skull base and vertebrae: No acute fracture. No primary bone lesion or focal pathologic process. Soft tissues and spinal canal: No prevertebral fluid or swelling. No visible canal hematoma. Disc levels:  Intact. Upper chest: Extensive heterogeneous airspace opacity of the included right upper lobe. Other: None. IMPRESSION: 1. No acute intracranial pathology. 2. Soft tissue contusion of the forehead. 3. No fracture or static subluxation of the cervical spine. 4. Extensive heterogeneous airspace opacity of the included right upper lobe, concerning for pneumonia. Electronically Signed   By: Lauralyn Primes M.D.   On: 09/08/2019 20:56   CT  Cervical Spine Wo Contrast  Result Date: 09/08/2019 CLINICAL DATA:  Multiple falls, bruised right forehead EXAM: CT HEAD WITHOUT CONTRAST CT CERVICAL SPINE WITHOUT CONTRAST TECHNIQUE: Multidetector CT imaging of the head and cervical spine was performed following the standard protocol without intravenous contrast. Multiplanar CT image reconstructions of the cervical spine were also generated. COMPARISON:  MR brain, 07/24/2019, CT cervical spine, 02/07/2019 FINDINGS: CT HEAD FINDINGS Brain: No evidence of acute infarction, hemorrhage, hydrocephalus, extra-axial collection or mass lesion/mass effect. Vascular: No hyperdense vessel or unexpected calcification. Skull: Hyperostosis. Negative for fracture or focal lesion. Sinuses/Orbits: No acute finding. Other: Soft tissue contusion of the forehead. CT CERVICAL SPINE FINDINGS Alignment: Normal. Skull base and vertebrae: No acute fracture. No primary bone lesion or focal pathologic process. Soft tissues and spinal canal: No prevertebral fluid or swelling. No visible canal hematoma. Disc levels:  Intact. Upper chest: Extensive heterogeneous airspace opacity of the included right upper lobe. Other: None. IMPRESSION: 1. No acute intracranial pathology. 2. Soft tissue contusion of the forehead. 3. No fracture or static subluxation of the cervical spine. 4. Extensive heterogeneous airspace opacity of the included right upper lobe, concerning for pneumonia. Electronically Signed   By: Lauralyn Primes M.D.   On: 09/08/2019 20:56   DG Pelvis Portable  Result Date: 09/08/2019 CLINICAL DATA:  Acute pain due to trauma. EXAM: PORTABLE PELVIS 1-2 VIEWS COMPARISON:  None. FINDINGS: There is no acute displaced fracture or dislocation. There is mild bilateral hip osteoarthritis. IMPRESSION: Negative. Electronically Signed   By: Katherine Mantle M.D.   On: 09/08/2019 22:08   DG Chest Portable 1 View  Result Date: 09/08/2019 CLINICAL DATA:  Pain status post fall EXAM: PORTABLE CHEST  1 VIEW COMPARISON:  February 07, 2019 FINDINGS: There are new airspace opacities scattered throughout the right lung zone. The left lung field appears essentially clear. There may be some right-sided volume loss. There is no pneumothorax. No large pleural effusion. The heart size is stable from prior study. There is no acute osseous abnormality. IMPRESSION: 1. No acute traumatic abnormality. 2. Scattered airspace opacities primarily throughout the right lung field is concerning for pneumonia or aspiration. Follow-up to radiologic resolution is recommended. Electronically Signed   By: Katherine Mantle M.D.   On: 09/08/2019 22:10   DG Femur Portable Min 2 Views Right  Result Date: 09/08/2019 CLINICAL DATA:  Pain EXAM: RIGHT FEMUR PORTABLE 2 VIEW COMPARISON:  None. FINDINGS: There is no evidence of fracture or other focal bone lesions. Soft tissues are unremarkable. There is mild right hip osteoarthritis. IMPRESSION: Negative. Electronically Signed   By:  Katherine Mantle M.D.   On: 09/08/2019 22:08        Scheduled Meds: . dexamethasone (DECADRON) injection  6 mg Intravenous Q24H  . enoxaparin (LOVENOX) injection  40 mg Subcutaneous Q24H  . sodium chloride flush  3 mL Intravenous Q12H   Continuous Infusions: . sodium chloride    . ampicillin-sulbactam (UNASYN) IV Stopped (09/09/19 0315)  . [START ON 09/10/2019] remdesivir 100 mg in NS 100 mL    . sodium chloride       LOS: 1 day    Time spent:35 minutes.     Alba Cory, MD [facility]   If 7PM-7AM, please contact night-coverage [email or web address] Password [facility] 09/09/2019, 7:51 AM

## 2019-09-09 NOTE — ED Notes (Signed)
Pt. Will respond yes if asked if she is ok. Bp has ran high last two times. But pt was agitated during blood draws. She does not like mto straighten out her arm but will  to do so.

## 2019-09-09 NOTE — H&P (Signed)
History and Physical    Alexandria Harvey BTY:[id number] DOB: August 01, 1981 DOA: 09/08/2019  PCP: Javier Docker, MD   Patient coming from: Group home   Chief Complaint: Decreased LOC, fall a few days ago   HPI: Alexandria Harvey is a 39 y.o. female with medical history significant for schizoaffective disorder, intellectual disability, type 2 diabetes mellitus, hypertension, and parkinsonism, now presenting from her group home for decreased level of consciousness after a fall a few days ago.  Patient is answering some questions intermittently but unable to provide much history.  Patient's caregiver reports that the patient fell a few days ago and has a bruise on her forehead.  Per report of the patient's legal guardian, the patient is agitated at times but will usually make conversation at her baseline, but has not been speaking much now.  EMS reported that there has been Covid exposure at the group home.  ED Course: Upon arrival to the ED, patient is found to be febrile to 39.4, saturating mid 80s on room air, mildly tachypneic, slightly tachycardic, and with blood pressure 89/53.  EKG features sinus rhythm.  Noncontrast head CT is negative for acute intracranial abnormality and no fracture noted on cervical spine CT.  Chest x-ray with scattered airspace opacities primarily on the right and concerning for pneumonia or aspiration.  Radiographs of the pelvis and right femur are negative.  Chemistry panel with albumin of 2.5, AST 120, and potassium 5.2.  CBC with mild thrombocytopenia.  Covid antigen test positive.  Procalcitonin normal.  D-dimer 1.82.  Lactic acid 4.2.  Patient had a CBG of 68.  Blood cultures were collected in the ED, 500 cc normal saline was given, patient was also treated with IV dextrose, and hospitalist asked to admit.  Review of Systems:  Unable to complete ROS ignore the patient's clinical condition.  Past Medical History:  Diagnosis Date  . Diabetes mellitus without  complication ([place])   . 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 Excessive anger   . GERD (gastroesophageal reflux disease)   . Hallucinations   . Hyperlipidemia   . Hypertension   . Mild mental retardation   . Schizophrenia ([place])   . Seizures ([place])   . Vitamin D deficiency     Past Surgical History:  Procedure Laterality Date  . NO PAST SURGERIES       reports that she has never smoked. She has never used smokeless tobacco. She reports that she does not drink alcohol or use drugs.  Allergies  Allergen Reactions  . Haloperidol Lactate Other (See Comments)    unk  . Other Rash    Strawberries  . Shrimp [Shellfish Allergy] Hives and Rash    Family History  Problem Relation Age of Onset  . Diabetes Mellitus II Mother   . Cancer Father      Prior to Admission medications   Medication Sig Start Date End Date Taking? Authorizing Provider  atenolol (TENORMIN) 50 MG tablet Take 1 tablet (50 mg total) by mouth daily. 01/25/15   Hildred Priest, MD  atorvastatin (LIPITOR) 80 MG tablet Take 1 tablet (80 mg total) by mouth at bedtime. 01/25/15   Hildred Priest, MD  benztropine (COGENTIN) 0.5 MG tablet Take 0.5 mg by mouth 2 (two) times daily.    [provider]  Blood Glucose Monitoring Suppl (ONE TOUCH ULTRA 2) w/Device KIT by Does not apply route. Use to check blood sugar as directed.    [provider]  CALCIUM PO Take 1 tablet  by mouth daily. 422m tablets    [provider]  carbidopa-levodopa (SINEMET IR) 25-100 MG tablet TAKE 1/2 TA 1 TABLET 3 TIMES DAILY. 08/16/19   WKathrynn Ducking MD  cetirizine (ZYRTEC) 10 MG tablet Take 10 mg by mouth at bedtime.    [provider]  clonazePAM (KLONOPIN) 0.5 MG tablet Take 0.5 mg by mouth 2 (two) times daily as needed for anxiety (taking 1/2 tab BID).    [provider]  cloZAPine (CLOZARIL) 100 MG tablet Take by mouth.    [provider]  clozapine (CLOZARIL) 200 MG  tablet Take 200 mg by mouth 2 (two) times daily.    [provider]  dicyclomine (BENTYL) 10 MG capsule Take 1 capsule (10 mg total) by mouth every 8 (eight) hours as needed for spasms. 06/29/17   Armbruster, SCarlota Raspberry MD  divalproex (DEPAKOTE) 500 MG DR tablet Take 1 tablet (500 mg total) by mouth 3 (three) times daily. 01/25/15   HHildred Priest MD  EPINEPHrine 0.3 mg/0.3 mL IJ SOAJ injection Inject 0.3 mg into the muscle as directed.    [provider]  lisinopril (PRINIVIL,ZESTRIL) 2.5 MG tablet Take 1 tablet (2.5 mg total) by mouth daily. 01/25/15   HHildred Priest MD  LORazepam (ATIVAN) 0.5 MG tablet Take 0.5 mg by mouth every 8 (eight) hours.    [provider]  lubiprostone (AMITIZA) 24 MCG capsule Take 24 mcg by mouth 2 (two) times daily with a meal.    [provider]  medroxyPROGESTERone (DEPO-PROVERA) 150 MG/ML injection Inject 150 mg into the muscle every 3 (three) months.    [provider]  metFORMIN (GLUCOPHAGE) 500 MG tablet Take 1,000 mg by mouth 2 (two) times daily with a meal.     [provider]  omeprazole (PRILOSEC) 20 MG capsule Take 20 mg by mouth daily.    [provider]  polyethylene glycol (MIRALAX / GLYCOLAX) packet Take 17 g by mouth 2 (two) times daily. 06/29/17   Armbruster, SCarlota Raspberry MD  Simethicone 80 MG TABS Take by mouth.    [provider]  Vitamin D, Ergocalciferol, (DRISDOL) 50000 units CAPS capsule Take 50,000 Units by mouth 2 (two) times a week.    [provider]    Physical Exam: Vitals:   09/08/19 2230 09/09/19 0018 09/09/19 0030 09/09/19 0045  BP: (!) 130/98 (!) 111/58    Pulse: (!) 107 (!) 106 (!) 106 (!) 105  Resp: (!) 25 (!) 22 (!) 23 (!) 24  Temp:  (!) 102.7 F (39.3 C)    TempSrc:      SpO2: 90% 93% 91% 91%    Constitutional: Not in acute respiratory distress, no diaphoresis  Eyes: PERTLA, lids and conjunctivae normal ENMT: Mucous  membranes are moist. Posterior pharynx clear of any exudate or lesions.   Neck: normal, supple, no masses, no thyromegaly Respiratory: mild tachypnea, no wheezing, no crackles. Normal respiratory effort. No accessory muscle use.  Cardiovascular: rate ~110 and regular. No extremity edema.   Abdomen: No distension, no tenderness, soft. Bowel sounds active.  Musculoskeletal: no clubbing / cyanosis. No joint deformity upper and lower extremities.  Skin: no significant rashes, lesions, ulcers. Warm, dry, well-perfused. Neurologic: PERRL. No gross facial weakness. Lethargic. Sensation intact. Moving all extremities spontaneously.  Psychiatric: Lethargic, responds to voice and tactile stimulus but not answering questions.      Labs on Admission: I have personally reviewed following labs and imaging studies  CBC: Recent Labs  Lab  09/08/19 1919 09/08/19 2105  WBC 7.5  --   NEUTROABS 5.4  --   HGB 12.9 12.6  HCT 39.9 37.0  MCV 91.5  --   PLT 129*  --    Basic Metabolic Panel: Recent Labs  Lab 09/08/19 1919 09/08/19 2105  NA 135 138  K 5.2* 4.9  CL 101 105  CO2 20*  --   GLUCOSE 93 73  BUN 21* 27*  CREATININE 0.84 0.70  CALCIUM 8.5*  --    GFR: Estimated Creatinine Clearance: 85.7 mL/min (by C-G formula based on SCr of 0.7 mg/dL). Liver Function Tests: Recent Labs  Lab 09/08/19 1919  AST 120*  ALT 13  ALKPHOS 43  BILITOT 0.6  PROT 6.5  ALBUMIN 2.5*   No results for input(s): LIPASE, AMYLASE in the last 168 hours. No results for input(s): AMMONIA in the last 168 hours. Coagulation Profile: No results for input(s): INR, PROTIME in the last 168 hours. Cardiac Enzymes: No results for input(s): CKTOTAL, CKMB, CKMBINDEX, TROPONINI in the last 168 hours. BNP (last 3 results) No results for input(s): PROBNP in the last 8760 hours. HbA1C: No results for input(s): HGBA1C in the last 72 hours. CBG: Recent Labs  Lab 09/08/19 2059 09/08/19 2220 09/09/19 0018  GLUCAP 68*  127* 141*   Lipid Profile: Recent Labs    09/08/19 2110  TRIG 194*   Thyroid Function Tests: No results for input(s): TSH, T4TOTAL, FREET4, T3FREE, THYROIDAB in the last 72 hours. Anemia Panel: Recent Labs    09/08/19 2110  FERRITIN 558*   Urine analysis:    Component Value Date/Time   COLORURINE YELLOW 02/05/2015 [place] 02/05/2015 2322   LABSPEC 1.027 02/05/2015 2322   PHURINE 5.5 02/05/2015 2322   GLUCOSEU >1000 (A) 02/05/2015 2322   HGBUR NEGATIVE 02/05/2015 2322   HGBUR negative 01/01/2009 1022   BILIRUBINUR NEGATIVE 02/05/2015 2322   KETONESUR 15 (A) 02/05/2015 2322   PROTEINUR NEGATIVE 02/05/2015 2322   UROBILINOGEN 0.2 02/05/2015 2322   NITRITE NEGATIVE 02/05/2015 2322   LEUKOCYTESUR NEGATIVE 02/05/2015 2322   Sepsis Labs: _0 (procalcitonin:4,lacticidven:4) )No results found for this or any previous visit (from the past 240 hour(s)).   Radiological Exams on Admission: CT Head Wo Contrast  Result Date: 09/08/2019 CLINICAL DATA:  Multiple falls, bruised right forehead EXAM: CT HEAD WITHOUT CONTRAST CT CERVICAL SPINE WITHOUT CONTRAST TECHNIQUE: Multidetector CT imaging of the head and cervical spine was performed following the standard protocol without intravenous contrast. Multiplanar CT image reconstructions of the cervical spine were also generated. COMPARISON:  MR brain, 07/24/2019, CT cervical spine, 02/07/2019 FINDINGS: CT HEAD FINDINGS Brain: No evidence of acute infarction, hemorrhage, hydrocephalus, extra-axial collection or mass lesion/mass effect. Vascular: No hyperdense vessel or unexpected calcification. Skull: Hyperostosis. Negative for fracture or focal lesion. Sinuses/Orbits: No acute finding. Other: Soft tissue contusion of the forehead. CT CERVICAL SPINE FINDINGS Alignment: Normal. Skull base and vertebrae: No acute fracture. No primary bone lesion or focal pathologic process. Soft tissues and spinal canal: No prevertebral fluid  or swelling. No visible canal hematoma. Disc levels:  Intact. Upper chest: Extensive heterogeneous airspace opacity of the included right upper lobe. Other: None. IMPRESSION: 1. No acute intracranial pathology. 2. Soft tissue contusion of the forehead. 3. No fracture or static subluxation of the cervical spine. 4. Extensive heterogeneous airspace opacity of the included right upper lobe, concerning for pneumonia. Electronically Signed   By: Eddie Candle M.D.   On: 09/08/2019 20:56   CT Cervical  Spine Wo Contrast  Result Date: 09/08/2019 CLINICAL DATA:  Multiple falls, bruised right forehead EXAM: CT HEAD WITHOUT CONTRAST CT CERVICAL SPINE WITHOUT CONTRAST TECHNIQUE: Multidetector CT imaging of the head and cervical spine was performed following the standard protocol without intravenous contrast. Multiplanar CT image reconstructions of the cervical spine were also generated. COMPARISON:  MR brain, 07/24/2019, CT cervical spine, 02/07/2019 FINDINGS: CT HEAD FINDINGS Brain: No evidence of acute infarction, hemorrhage, hydrocephalus, extra-axial collection or mass lesion/mass effect. Vascular: No hyperdense vessel or unexpected calcification. Skull: Hyperostosis. Negative for fracture or focal lesion. Sinuses/Orbits: No acute finding. Other: Soft tissue contusion of the forehead. CT CERVICAL SPINE FINDINGS Alignment: Normal. Skull base and vertebrae: No acute fracture. No primary bone lesion or focal pathologic process. Soft tissues and spinal canal: No prevertebral fluid or swelling. No visible canal hematoma. Disc levels:  Intact. Upper chest: Extensive heterogeneous airspace opacity of the included right upper lobe. Other: None. IMPRESSION: 1. No acute intracranial pathology. 2. Soft tissue contusion of the forehead. 3. No fracture or static subluxation of the cervical spine. 4. Extensive heterogeneous airspace opacity of the included right upper lobe, concerning for pneumonia. Electronically Signed   By: Eddie Candle M.D.   On: 09/08/2019 20:56   DG Pelvis Portable  Result Date: 09/08/2019 CLINICAL DATA:  Acute pain due to trauma. EXAM: PORTABLE PELVIS 1-2 VIEWS COMPARISON:  None. FINDINGS: There is no acute displaced fracture or dislocation. There is mild bilateral hip osteoarthritis. IMPRESSION: Negative. Electronically Signed   By: Constance Holster M.D.   On: 09/08/2019 22:08   DG Chest Portable 1 View  Result Date: 09/08/2019 CLINICAL DATA:  Pain status post fall EXAM: PORTABLE CHEST 1 VIEW COMPARISON:  February 07, 2019 FINDINGS: There are new airspace opacities scattered throughout the right lung zone. The left lung field appears essentially clear. There may be some right-sided volume loss. There is no pneumothorax. No large pleural effusion. The heart size is stable from prior study. There is no acute osseous abnormality. IMPRESSION: 1. No acute traumatic abnormality. 2. Scattered airspace opacities primarily throughout the right lung field is concerning for pneumonia or aspiration. Follow-up to radiologic resolution is recommended. Electronically Signed   By: Constance Holster M.D.   On: 09/08/2019 22:10   DG Femur Portable Min 2 Views Right  Result Date: 09/08/2019 CLINICAL DATA:  Pain EXAM: RIGHT FEMUR PORTABLE 2 VIEW COMPARISON:  None. FINDINGS: There is no evidence of fracture or other focal bone lesions. Soft tissues are unremarkable. There is mild right hip osteoarthritis. IMPRESSION: Negative. Electronically Signed   By: Constance Holster M.D.   On: 09/08/2019 22:08    EKG: Independently reviewed. Sinus rhythm.   Assessment/Plan:   1. Sepsis secondary to pneumonia; COVID-19; ?aspiration  - Presents from her group home with decreased LOC, found to be febrile with tachycardia, SBP 89, lactate 4.2, positive COVID-19, and chest imaging concerning for pneumonia and/or aspiration  - Blood cultures were collected in ED, fluid bolus was given, and she was started on Unasyn for possible aspiration  pneumonia  - Repeat lactate, check UA, continue current antibiotic for now, start Decadron and remdesivir, trend markers, continue supplemental O2 as needed, and continue isolation and supportive care    2. Hypoglycemia; type II DM  - Glucose was in 60's in ED, she was treated with IV dextrose and food/juice - A1c was only 5.6% in October 2020  - Check CBG's, hold insulin for now    3. Elevated AST  -  AST is 120 in ED, previously normal  - Abdominal exam is benign, likely secondary to COVID infection, plan to follow closely while on remdesivir   4. Elevated d-dimer  - D-dimer is 1.82 on admission  - She is not hypoxic or complaining of chest pain, no leg swelling or tenderness noted, and this is likely secondary to COVID   5. Thrombocytopenia  - Platelets 129k on admission, previously normal  - Likely secondary to infection, no bleeding noted, monitor    6. Schizoaffective disorder  - Pharmacy medication-reconciliation pending    7. Hypertension  - There was a low BP reading in ED and antihypertensives held on admission     DVT prophylaxis: Lovenox  Code Status: Full  Family Communication: Discussed with patient  Consults called: None  Admission status: Inpatient     Vianne Bulls, MD [facility] Pager [phone]  If 7PM-7AM, please contact night-coverage [email or web address] Password [email or web address]  09/09/2019, 1:25 AM

## 2019-09-10 ENCOUNTER — Inpatient Hospital Stay (HOSPITAL_COMMUNITY): Payer: Medicare Other

## 2019-09-10 ENCOUNTER — Other Ambulatory Visit: Payer: Self-pay

## 2019-09-10 DIAGNOSIS — E872 Acidosis: Secondary | ICD-10-CM

## 2019-09-10 DIAGNOSIS — G9340 Encephalopathy, unspecified: Secondary | ICD-10-CM

## 2019-09-10 DIAGNOSIS — L8990 Pressure ulcer of unspecified site, unspecified stage: Secondary | ICD-10-CM | POA: Insufficient documentation

## 2019-09-10 DIAGNOSIS — U071 COVID-19: Secondary | ICD-10-CM

## 2019-09-10 DIAGNOSIS — F71 Moderate intellectual disabilities: Secondary | ICD-10-CM

## 2019-09-10 DIAGNOSIS — J9601 Acute respiratory failure with hypoxia: Secondary | ICD-10-CM

## 2019-09-10 LAB — COMPREHENSIVE METABOLIC PANEL
ALT: 15 U/L (ref 0–44)
AST: 111 U/L — ABNORMAL HIGH (ref 15–41)
Albumin: 1.9 g/dL — ABNORMAL LOW (ref 3.5–5.0)
Alkaline Phosphatase: 37 U/L — ABNORMAL LOW (ref 38–126)
Anion gap: 9 (ref 5–15)
BUN: 13 mg/dL (ref 6–20)
CO2: 22 mmol/L (ref 22–32)
Calcium: 8.1 mg/dL — ABNORMAL LOW (ref 8.9–10.3)
Chloride: 108 mmol/L (ref 98–111)
Creatinine, Ser: 0.38 mg/dL — ABNORMAL LOW (ref 0.44–1.00)
GFR calc Af Amer: >60 mL/min (ref >60)
GFR calc non Af Amer: >60 mL/min (ref >60)
Glucose, Bld: 90 mg/dL (ref 70–99)
Potassium: 5 mmol/L (ref 3.5–5.1)
Sodium: 139 mmol/L (ref 135–145)
Total Bilirubin: 0.7 mg/dL (ref 0.3–1.2)
Total Protein: 5.1 g/dL — ABNORMAL LOW (ref 6.5–8.1)

## 2019-09-10 LAB — PROCALCITONIN: Procalcitonin: 0.14 ng/mL

## 2019-09-10 LAB — PHOSPHORUS: Phosphorus: 2.4 mg/dL — ABNORMAL LOW (ref 2.5–4.6)

## 2019-09-10 LAB — STREP PNEUMONIAE URINARY ANTIGEN: Strep Pneumo Urinary Antigen: NEGATIVE

## 2019-09-10 LAB — CBC WITH DIFFERENTIAL/PLATELET
Abs Immature Granulocytes: 0.28 10*3/uL — ABNORMAL HIGH (ref 0.00–0.07)
Basophils Absolute: 0.1 10*3/uL (ref 0.0–0.1)
Basophils Relative: 1 %
Eosinophils Absolute: 0 10*3/uL (ref 0.0–0.5)
Eosinophils Relative: 0 %
HCT: 31.4 % — ABNORMAL LOW (ref 36.0–46.0)
Hemoglobin: 10 g/dL — ABNORMAL LOW (ref 12.0–15.0)
Immature Granulocytes: 3 %
Lymphocytes Relative: 19 %
Lymphs Abs: 1.8 10*3/uL (ref 0.7–4.0)
MCH: 29 pg (ref 26.0–34.0)
MCHC: 31.8 g/dL (ref 30.0–36.0)
MCV: 91 fL (ref 80.0–100.0)
Monocytes Absolute: 0.7 10*3/uL (ref 0.1–1.0)
Monocytes Relative: 7 %
Neutro Abs: 6.6 10*3/uL (ref 1.7–7.7)
Neutrophils Relative %: 70 %
Platelets: 128 10*3/uL — ABNORMAL LOW (ref 150–400)
RBC: 3.45 MIL/uL — ABNORMAL LOW (ref 3.87–5.11)
RDW: 17.2 % — ABNORMAL HIGH (ref 11.5–15.5)
WBC: 9.4 10*3/uL (ref 4.0–10.5)
nRBC: 0 % (ref 0.0–0.2)

## 2019-09-10 LAB — MAGNESIUM: Magnesium: 2 mg/dL (ref 1.7–2.4)

## 2019-09-10 LAB — C-REACTIVE PROTEIN: CRP: 8.4 mg/dL — ABNORMAL HIGH

## 2019-09-10 LAB — GLUCOSE, CAPILLARY
Glucose-Capillary: 100 mg/dL — ABNORMAL HIGH (ref 70–99)
Glucose-Capillary: 127 mg/dL — ABNORMAL HIGH (ref 70–99)
Glucose-Capillary: 199 mg/dL — ABNORMAL HIGH (ref 70–99)
Glucose-Capillary: 205 mg/dL — ABNORMAL HIGH (ref 70–99)
Glucose-Capillary: 178 mg/dL — ABNORMAL HIGH (ref 70–99)
Glucose-Capillary: 151 mg/dL — ABNORMAL HIGH (ref 70–99)

## 2019-09-10 LAB — D-DIMER, QUANTITATIVE: D-Dimer, Quant: 1.24 ug/mL-FEU — ABNORMAL HIGH (ref 0.00–0.50)

## 2019-09-10 LAB — FERRITIN: Ferritin: 515 ng/mL — ABNORMAL HIGH (ref 11–307)

## 2019-09-10 MED ORDER — SODIUM POLYSTYRENE SULFONATE 15 GM/60ML PO SUSP
15.0000 g | Freq: Once | ORAL | Status: AC
  Administered 2019-09-10: 15 g via ORAL
  Filled 2019-09-10: qty 60

## 2019-09-10 MED ORDER — SODIUM CHLORIDE 0.9 % IV SOLN: INTRAVENOUS | Status: AC

## 2019-09-10 MED ORDER — SODIUM CHLORIDE 0.9 % IV SOLN: INTRAVENOUS | Status: DC

## 2019-09-10 NOTE — Progress Notes (Addendum)
Pt had 3 coupled PVCs. MD made aware. Pt asymptomatic. Pt in NSR currently. Strip scanned into chart

## 2019-09-10 NOTE — Progress Notes (Signed)
[facility] PROGRESS NOTE    Progress Note  Alexandria Harvey  YJE:[id number] DOB: [birth year] DOA: 09/08/2019 PCP: Javier Docker, MD     Brief Narrative:   Alexandria Harvey is an 39 y.o. female past medical history significant for schizoaffective disorder, intellectual disability diabetes mellitus type 2 and essential hypertension who presents with significant decrease in level of consciousness that started a few days prior to admission.  As per facility the patient has not been conversing much for the last several days in the ED she was found to be hypoxic chest x-ray showed bilateral infiltrates and SARS-CoV-2 PCR was positive.  Assessment/Plan:   Acute respiratory failure with hypoxia secondarily to COVID-19 pneumonia/sepsis: She met sepsis criteria on admission she was started on IV remdesivir steroids and IV Unasyn.  She was fluid resuscitated and culture data is pending till date. Her inflammatory markers are significantly elevated, continues into spirometry and flutter valve.  I am not sure the patient is using this regularly. Her procalcitonin on admission was 0.5, lactic acid owas 4. Her lactic acidosis is improving procalcitonin was less than 0.5, her blood pressure is borderline we will restart her on IV fluids monitor strict I's and O's and daily weights.   Metabolic encephalopathy: Likely due to infectious process, CT of the head was negative for acute intracranial processes. See above for further details continue supportive care.  Uncontrolled diabetes mellitus type 2 with hyperglycemia: Metformin held on admission Hemoglobin A1c was 6.3 continue sliding scale we will keep a close eye on her blood glucose.  Elevated LFTs: Likely due to infectious etiology.  Elevated D-dimer: Currently on Lovenox for DVT prophylaxis.  Unlikely thromboembolic events we will continue to trend CRP and D-dimer.  Likely due to inflammatory state in the setting of SARS-CoV-2  infection.  Thrombocytopenia: Question due to infectious etiology has remained stable continue to monitor.  Schizoaffective disorder/mood disorder: Continue current home medications.  Essential hypertension: The setting of sepsis continue to hold antihypertensive medication.  Anion gap metabolic acidosis due to lactic acidosis: She was fluid resuscitated lactate is improving. Exchange IV fluids for normal saline.  Hyperkalemia: It was improving now is trending up we will give her Kayexalate orally.    Moderate intellectual disabilities  Sacral decubitus ulcer stage II present on admission: RN Pressure Injury Documentation: Pressure Injury 09/09/19 Sacrum Left;Right Stage 2 -  Partial thickness loss of dermis presenting as a shallow open injury with a red, pink wound bed without slough. (Active)  09/09/19 2300  Location: Sacrum  Location Orientation: Left;Right  Staging: Stage 2 -  Partial thickness loss of dermis presenting as a shallow open injury with a red, pink wound bed without slough.  Wound Description (Comments):   Present on Admission: Yes    Estimated body mass index is 27.62 kg/m as calculated from the following:   Height as of this encounter: '5\' 2"'$  (1.575 m).   Weight as of this encounter: 68.5 kg.   DVT prophylaxis: lovenox Family Communication:none Disposition Plan/Barrier to D/C: unable to determine Code Status:     Code Status Orders  (From admission, onward)         Start     Ordered   09/09/19 0551  Full code  Continuous     09/09/19 0550        Code Status History    Date Active Date Inactive Code Status Order ID Comments User Context   02/05/2015 2118 02/06/2015 2149 Full Code [id number]  Malvin Johns, MD ED  01/18/2015 0332 01/25/2015 1810 Full Code [id number]  Gonzella Lex, MD Inpatient   11/30/2014 1707 12/04/2014 2023 Full Code [id number]  Patrecia Pour, MD ED   11/13/2014 0240 11/14/2014 0111 Full Code [id number]  Rolland Porter, MD ED    10/05/2014 2127 10/07/2014 0112 Full Code [id number]  Francine Graven, DO ED   09/29/2014 2302 10/01/2014 1751 Full Code [id number]  Orpah Greek., MD ED   Advance Care Planning Activity    Advance Directive Documentation     Most Recent Value  Type of Advance Directive  Healthcare Power of Attorney  Pre-existing out of facility DNR order (yellow form or pink MOST form)  --  "MOST" Form in Place?  --        IV Access:    Peripheral IV   Procedures and diagnostic studies:   CT Head Wo Contrast  Result Date: 09/08/2019 CLINICAL DATA:  Multiple falls, bruised right forehead EXAM: CT HEAD WITHOUT CONTRAST CT CERVICAL SPINE WITHOUT CONTRAST TECHNIQUE: Multidetector CT imaging of the head and cervical spine was performed following the standard protocol without intravenous contrast. Multiplanar CT image reconstructions of the cervical spine were also generated. COMPARISON:  MR brain, 07/24/2019, CT cervical spine, 02/07/2019 FINDINGS: CT HEAD FINDINGS Brain: No evidence of acute infarction, hemorrhage, hydrocephalus, extra-axial collection or mass lesion/mass effect. Vascular: No hyperdense vessel or unexpected calcification. Skull: Hyperostosis. Negative for fracture or focal lesion. Sinuses/Orbits: No acute finding. Other: Soft tissue contusion of the forehead. CT CERVICAL SPINE FINDINGS Alignment: Normal. Skull base and vertebrae: No acute fracture. No primary bone lesion or focal pathologic process. Soft tissues and spinal canal: No prevertebral fluid or swelling. No visible canal hematoma. Disc levels:  Intact. Upper chest: Extensive heterogeneous airspace opacity of the included right upper lobe. Other: None. IMPRESSION: 1. No acute intracranial pathology. 2. Soft tissue contusion of the forehead. 3. No fracture or static subluxation of the cervical spine. 4. Extensive heterogeneous airspace opacity of the included right upper lobe, concerning for pneumonia. Electronically Signed   By:  Eddie Candle M.D.   On: 09/08/2019 20:56   CT Cervical Spine Wo Contrast  Result Date: 09/08/2019 CLINICAL DATA:  Multiple falls, bruised right forehead EXAM: CT HEAD WITHOUT CONTRAST CT CERVICAL SPINE WITHOUT CONTRAST TECHNIQUE: Multidetector CT imaging of the head and cervical spine was performed following the standard protocol without intravenous contrast. Multiplanar CT image reconstructions of the cervical spine were also generated. COMPARISON:  MR brain, 07/24/2019, CT cervical spine, 02/07/2019 FINDINGS: CT HEAD FINDINGS Brain: No evidence of acute infarction, hemorrhage, hydrocephalus, extra-axial collection or mass lesion/mass effect. Vascular: No hyperdense vessel or unexpected calcification. Skull: Hyperostosis. Negative for fracture or focal lesion. Sinuses/Orbits: No acute finding. Other: Soft tissue contusion of the forehead. CT CERVICAL SPINE FINDINGS Alignment: Normal. Skull base and vertebrae: No acute fracture. No primary bone lesion or focal pathologic process. Soft tissues and spinal canal: No prevertebral fluid or swelling. No visible canal hematoma. Disc levels:  Intact. Upper chest: Extensive heterogeneous airspace opacity of the included right upper lobe. Other: None. IMPRESSION: 1. No acute intracranial pathology. 2. Soft tissue contusion of the forehead. 3. No fracture or static subluxation of the cervical spine. 4. Extensive heterogeneous airspace opacity of the included right upper lobe, concerning for pneumonia. Electronically Signed   By: Eddie Candle M.D.   On: 09/08/2019 20:56   DG Pelvis Portable  Result Date: 09/08/2019 CLINICAL DATA:  Acute pain due to trauma. EXAM: PORTABLE PELVIS 1-2 VIEWS COMPARISON:  None.  FINDINGS: There is no acute displaced fracture or dislocation. There is mild bilateral hip osteoarthritis. IMPRESSION: Negative. Electronically Signed   By: Constance Holster M.D.   On: 09/08/2019 22:08   DG Chest Portable 1 View  Result Date: 09/08/2019 CLINICAL  DATA:  Pain status post fall EXAM: PORTABLE CHEST 1 VIEW COMPARISON:  February 07, 2019 FINDINGS: There are new airspace opacities scattered throughout the right lung zone. The left lung field appears essentially clear. There may be some right-sided volume loss. There is no pneumothorax. No large pleural effusion. The heart size is stable from prior study. There is no acute osseous abnormality. IMPRESSION: 1. No acute traumatic abnormality. 2. Scattered airspace opacities primarily throughout the right lung field is concerning for pneumonia or aspiration. Follow-up to radiologic resolution is recommended. Electronically Signed   By: Constance Holster M.D.   On: 09/08/2019 22:10   DG Femur Portable Min 2 Views Right  Result Date: 09/08/2019 CLINICAL DATA:  Pain EXAM: RIGHT FEMUR PORTABLE 2 VIEW COMPARISON:  None. FINDINGS: There is no evidence of fracture or other focal bone lesions. Soft tissues are unremarkable. There is mild right hip osteoarthritis. IMPRESSION: Negative. Electronically Signed   By: Constance Holster M.D.   On: 09/08/2019 22:08     Medical Consultants:    None.  Anti-Infectives:   IV remdesivir and Unasyn  Subjective:    Alexandria Harvey she relates her breathing is unchanged, persistently anorexic  Objective:    Vitals:   09/09/19 1942 09/09/19 2307 09/10/19 0000 09/10/19 0439  BP: 111/61 121/72  (!) 92/49  Pulse: 91 83 89 89  Resp: '15 17 18 20  '$ Temp:  98.1 F (36.7 C)  97.7 F (36.5 C)  TempSrc:  Axillary  Axillary  SpO2: 97% 96% 98% 94%  Weight:  68.5 kg    Height:  '5\' 2"'$  (1.575 m)     SpO2: 94 % O2 Flow Rate (L/min): 2 L/min   Intake/Output Summary (Last 24 hours) at 09/10/2019 0705 Last data filed at 09/10/2019 0300 Gross per 24 hour  Intake 1669.13 ml  Output --  Net 1669.13 ml   Filed Weights   09/09/19 2307  Weight: 68.5 kg    Exam: General exam: In no acute distress, morbidly obese. Respiratory system: Good air movement diffuse crackles  bilaterally. Cardiovascular system: S1 & S2 heard, RRR. No JVD. Gastrointestinal system: Abdomen is nondistended, soft and nontender.  Central nervous system: Alert and oriented. No focal neurological deficits. Extremities: No pedal edema. Skin: No rashes, lesions or ulcers Psychiatry: Judgement and insight appear normal. Mood & affect appropriate.    Data Reviewed:    Labs: Basic Metabolic Panel: Recent Labs  Lab 09/08/19 1919 09/08/19 2105 09/09/19 1532 09/09/19 1558  NA 135 138 139 139  K 5.2* 4.9 4.6 4.3  CL 101 105 108  --   CO2 20*  --  20*  --   GLUCOSE 93 73 127*  --   BUN 21* 27* 14  --   CREATININE 0.84 0.70 0.56  --   CALCIUM 8.5*  --  7.7*  --    GFR Estimated Creatinine Clearance: 87.4 mL/min (by C-G formula based on SCr of 0.56 mg/dL). Liver Function Tests: Recent Labs  Lab 09/08/19 1919  AST 120*  ALT 13  ALKPHOS 43  BILITOT 0.6  PROT 6.5  ALBUMIN 2.5*   No results for input(s): LIPASE, AMYLASE in the last 168 hours. No results for input(s): AMMONIA in the last 168 hours.  Coagulation profile No results for input(s): INR, PROTIME in the last 168 hours. COVID-19 Labs  Recent Labs    09/08/19 2110 09/08/19 2142 09/10/19 0417  DDIMER 1.82*  --  1.24*  FERRITIN 558*  --   --   LDH  --  465*  --   CRP 5.5*  --   --     No results found for: SARSCOV2NAA  CBC: Recent Labs  Lab 09/08/19 1919 09/08/19 2105 09/09/19 1532 09/09/19 1558 09/10/19 0417  WBC 7.5  --  11.9*  --  9.4  NEUTROABS 5.4  --   --   --  6.6  HGB 12.9 12.6 10.2* 10.5* 10.0*  HCT 39.9 37.0 32.2* 31.0* 31.4*  MCV 91.5  --  92.8  --  91.0  PLT 129*  --  124*  --  128*   Cardiac Enzymes: No results for input(s): CKTOTAL, CKMB, CKMBINDEX, TROPONINI in the last 168 hours. BNP (last 3 results) No results for input(s): PROBNP in the last 8760 hours. CBG: Recent Labs  Lab 09/09/19 1150 09/09/19 1457 09/09/19 2038 09/09/19 2335 09/10/19 0447  GLUCAP 142* 112* 85 81  100*   D-Dimer: Recent Labs    09/08/19 2110 09/10/19 0417  DDIMER 1.82* 1.24*   Hgb A1c: Recent Labs    09/09/19 0432  HGBA1C 6.3*   Lipid Profile: Recent Labs    09/08/19 2110  TRIG 194*   Thyroid function studies: No results for input(s): TSH, T4TOTAL, T3FREE, THYROIDAB in the last 72 hours.  Invalid input(s): FREET3 Anemia work up: Recent Labs    09/08/19 2110  FERRITIN 558*   Sepsis Labs: Recent Labs  Lab 09/08/19 1919 09/08/19 2142 09/09/19 0335 09/09/19 0726 09/09/19 1504 09/09/19 1532 09/09/19 1557 09/10/19 0417  PROCALCITON  --  0.15  --   --   --   --   --   --   WBC 7.5  --   --   --   --  11.9*  --  9.4  LATICACIDVEN  --   --  4.5* 3.5* 2.1*  --  2.5*  --    Microbiology Recent Results (from the past 240 hour(s))  Blood Culture (routine x 2)     Status: None (Preliminary result)   Collection Time: 09/08/19  7:41 PM   Specimen: BLOOD  Result Value Ref Range Status   Specimen Description BLOOD RIGHT ANTECUBITAL  Final   Special Requests   Final    BOTTLES DRAWN AEROBIC AND ANAEROBIC Blood Culture adequate volume   Culture   Final    NO GROWTH < 12 HOURS Performed at [facility], [place] [address], [place], [place] [address]    Report Status PENDING  Incomplete  Blood Culture (routine x 2)     Status: None (Preliminary result)   Collection Time: 09/08/19  7:58 PM   Specimen: BLOOD  Result Value Ref Range Status   Specimen Description BLOOD LEFT ANTECUBITAL  Final   Special Requests   Final    BOTTLES DRAWN AEROBIC AND ANAEROBIC Blood Culture adequate volume   Culture   Final    NO GROWTH < 12 HOURS Performed at [facility], [place] [address], [place], [place] [address]    Report Status PENDING  Incomplete  MRSA PCR Screening     Status: None   Collection Time: 09/09/19 12:30 AM   Specimen: Nasal Mucosa; Nasopharyngeal  Result Value Ref Range Status   MRSA by PCR NEGATIVE NEGATIVE  Final    Comment:        The GeneXpert MRSA  Assay (FDA approved for NASAL specimens only), is one component of a comprehensive MRSA colonization surveillance program. It is not intended to diagnose MRSA infection nor to guide or monitor treatment for MRSA infections. Performed at [facility], [place] [address] [place], [place] [address]      Medications:   . acetaminophen  650 mg Rectal Once  . benztropine  1 mg Oral BID  . carbidopa-levodopa  1 tablet Oral TID  . dexamethasone (DECADRON) injection  6 mg Intravenous Q24H  . divalproex  1,000 mg Oral BID  . enoxaparin (LOVENOX) injection  40 mg Subcutaneous Q24H  . lubiprostone  24 mcg Oral Q breakfast  . pantoprazole  40 mg Oral Daily  . sodium chloride flush  3 mL Intravenous Q12H  . [START ON 09/12/2019] Vitamin D (Ergocalciferol)  50,000 Units Oral Once per day on Tue Fri   Continuous Infusions: . sodium chloride    . ampicillin-sulbactam (UNASYN) IV 3 g (09/10/19 0501)  . lactated ringers 100 mL/hr at 09/09/19 2348  . remdesivir 100 mg in NS 100 mL       LOS: 2 days   Charlynne Cousins  [facility]  09/10/2019, 7:05 AM

## 2019-09-11 LAB — COMPREHENSIVE METABOLIC PANEL
ALT: 23 U/L (ref 0–44)
AST: 83 U/L — ABNORMAL HIGH (ref 15–41)
Albumin: 1.8 g/dL — ABNORMAL LOW (ref 3.5–5.0)
Alkaline Phosphatase: 40 U/L (ref 38–126)
Anion gap: 13 (ref 5–15)
BUN: 9 mg/dL (ref 6–20)
CO2: 23 mmol/L (ref 22–32)
Calcium: 8.2 mg/dL — ABNORMAL LOW (ref 8.9–10.3)
Chloride: 104 mmol/L (ref 98–111)
Creatinine, Ser: 0.45 mg/dL (ref 0.44–1.00)
GFR calc Af Amer: >60 mL/min (ref >60)
GFR calc non Af Amer: >60 mL/min (ref >60)
Glucose, Bld: 158 mg/dL — ABNORMAL HIGH (ref 70–99)
Potassium: 4.5 mmol/L (ref 3.5–5.1)
Sodium: 140 mmol/L (ref 135–145)
Total Bilirubin: 0.6 mg/dL (ref 0.3–1.2)
Total Protein: 5 g/dL — ABNORMAL LOW (ref 6.5–8.1)

## 2019-09-11 LAB — CBC WITH DIFFERENTIAL/PLATELET
Abs Immature Granulocytes: 0.71 10*3/uL — ABNORMAL HIGH (ref 0.00–0.07)
Basophils Absolute: 0.1 10*3/uL (ref 0.0–0.1)
Basophils Relative: 1 %
Eosinophils Absolute: 0 10*3/uL (ref 0.0–0.5)
Eosinophils Relative: 0 %
HCT: 30.5 % — ABNORMAL LOW (ref 36.0–46.0)
Hemoglobin: 9.8 g/dL — ABNORMAL LOW (ref 12.0–15.0)
Immature Granulocytes: 8 %
Lymphocytes Relative: 20 %
Lymphs Abs: 1.9 10*3/uL (ref 0.7–4.0)
MCH: 29.3 pg (ref 26.0–34.0)
MCHC: 32.1 g/dL (ref 30.0–36.0)
MCV: 91 fL (ref 80.0–100.0)
Monocytes Absolute: 0.6 10*3/uL (ref 0.1–1.0)
Monocytes Relative: 6 %
Neutro Abs: 6 10*3/uL (ref 1.7–7.7)
Neutrophils Relative %: 65 %
Platelets: 168 10*3/uL (ref 150–400)
RBC: 3.35 MIL/uL — ABNORMAL LOW (ref 3.87–5.11)
RDW: 17.3 % — ABNORMAL HIGH (ref 11.5–15.5)
WBC: 9.3 10*3/uL (ref 4.0–10.5)
nRBC: 0.2 % (ref 0.0–0.2)

## 2019-09-11 LAB — GLUCOSE, CAPILLARY
Glucose-Capillary: 149 mg/dL — ABNORMAL HIGH (ref 70–99)
Glucose-Capillary: 168 mg/dL — ABNORMAL HIGH (ref 70–99)
Glucose-Capillary: 214 mg/dL — ABNORMAL HIGH (ref 70–99)
Glucose-Capillary: 163 mg/dL — ABNORMAL HIGH (ref 70–99)
Glucose-Capillary: 140 mg/dL — ABNORMAL HIGH (ref 70–99)
Glucose-Capillary: 89 mg/dL (ref 70–99)

## 2019-09-11 LAB — D-DIMER, QUANTITATIVE: D-Dimer, Quant: 1.08 ug/mL-FEU — ABNORMAL HIGH (ref 0.00–0.50)

## 2019-09-11 LAB — C-REACTIVE PROTEIN: CRP: 3.4 mg/dL — ABNORMAL HIGH (ref <1.0)

## 2019-09-11 LAB — PROCALCITONIN: Procalcitonin: <0.1 ng/mL

## 2019-09-11 LAB — LEGIONELLA PNEUMOPHILA SEROGP 1 UR AG: L. pneumophila Serogp 1 Ur Ag: NEGATIVE

## 2019-09-11 MED ORDER — FERROUS SULFATE 325 (65 FE) MG PO TABS
325.0000 mg | ORAL_TABLET | Freq: Three times a day (TID) | ORAL | Status: DC
  Administered 2019-09-11 – 2019-09-15 (×12): 325 mg via ORAL
  Filled 2019-09-11 (×15): qty 1

## 2019-09-11 MED ORDER — SODIUM CHLORIDE 0.9 % IV SOLN
3.0000 g | Freq: Four times a day (QID) | INTRAVENOUS | Status: DC
  Administered 2019-09-11 – 2019-09-13 (×9): 3 g via INTRAVENOUS
  Filled 2019-09-11 (×4): qty 8
  Filled 2019-09-11: qty 3
  Filled 2019-09-11 (×5): qty 8
  Filled 2019-09-11: qty 3
  Filled 2019-09-11: qty 8

## 2019-09-11 MED ORDER — POLYETHYLENE GLYCOL 3350 17 G PO PACK: 17.0000 g | PACK | Freq: Every day | ORAL | Status: AC

## 2019-09-11 NOTE — Progress Notes (Signed)
Pharmacy Antibiotic Note  Alexandria Harvey is a 39 y.o. female admitted on 09/08/2019 with fall.  Pharmacy has been consulted for Unasyn dosing. WBC WNL. Renal function good. CXR concerning for aspiration. Point of care COVID +.   Plan: Increase Unasyn 3g IV q6h Trend WBC, temp, renal function  F/U infectious work-up   Temp (24hrs), Avg:97.9 F (36.6 C), Min:97.6 F (36.4 C), Max:98.1 F (36.7 C)  Recent Labs  Lab 09/08/19 1919 09/08/19 2105 09/08/19 2111 09/09/19 0335 09/09/19 0726 09/09/19 1504 09/09/19 1532 09/09/19 1557 09/10/19 0417 09/11/19 0455  WBC 7.5  --   --   --   --   --  11.9*  --  9.4 9.3  CREATININE 0.84 0.70  --   --   --   --  0.56  --  0.38* 0.45  LATICACIDVEN  --   --  4.2* 4.5* 3.5* 2.1*  --  2.5*  --   --     Estimated Creatinine Clearance: 88 mL/min (by C-G formula based on SCr of 0.45 mg/dL).    Allergies  Allergen Reactions  . Haloperidol Lactate Other (See Comments)    Unknown reaction  . Other Rash    Strawberries  . Shrimp [Shellfish Allergy] Hives and Rash    Troi Florendo A. Jeanella Craze, PharmD, BCPS, FNKF Clinical Pharmacist [place] Please utilize Amion for appropriate phone number to reach the unit pharmacist [facility])

## 2019-09-11 NOTE — Progress Notes (Addendum)
[facility] PROGRESS NOTE    Progress Note  Alexandria Harvey  MGQ:[id number] DOB: [birth year] DOA: 09/08/2019 PCP: Javier Docker, MD     Brief Narrative:   Alexandria Harvey is an 39 y.o. female past medical history significant for schizoaffective disorder, intellectual disability diabetes mellitus type 2 and essential hypertension who presents with significant decrease in level of consciousness that started a few days prior to admission.  As per facility the patient has not been conversing much for the last several days in the ED she was found to be hypoxic chest x-ray showed bilateral infiltrates and SARS-CoV-2 PCR was positive.  Assessment/Plan:   Acute respiratory failure with hypoxia secondarily to COVID-19 pneumonia/sepsis due to aspiration pneumonia: She is started on 2 L of oxygen to keep saturations greater 90%.   Her inflammatory markers are worsening, her procalcitonin is worsening, her lactic acid was improving and her leukocytosis is improved. She seems to be improving clinically we will continue to monitor closely. Culture data has remained negative till date. Blood pressure is improving her sepsis physiology is resolving.  Continue empiric antibiotics repeat a chest x-ray showed new right lower lobe infiltrates. Continue strict I's and O's and daily weights, try to keep the patient peripherally 16 hours a day if not prone out of bed to chair, continues into spirometry and flutter valve.  Metabolic encephalopathy: Now resolved Likely due to infectious process, CT of the head was negative for acute intracranial processes. \ Uncontrolled diabetes mellitus type 2 with hyperglycemia: Metformin held on admission, with an A1c of 6.3 blood glucose fairly controlled.  Elevated LFTs: Likely due to infectious etiology.  Elevated D-dimer: Currently on Lovenox for DVT prophylaxis.  Unlikely a thromboembolic event D-dimer is acting as an acute phase reactant continue to  monitor closely.  Thrombocytopenia: Likely due to infectious etiology, now resolved.  Schizoaffective disorder/mood disorder: Continue current home medications.  Essential hypertension: The setting of sepsis continue to hold antihypertensive medication.  Anion gap metabolic acidosis due to lactic acidosis: Improved with supportive care IV fluid hydration and IV antibiotics now resolved.  Hyperkalemia: She was given oral Kayexalate now resolved.  Normocytic anemia: Menstruating female likely iron deficiency replacing ferrous sulfate.    Moderate intellectual disabilities  Sacral decubitus ulcer stage II present on admission: RN Pressure Injury Documentation: Pressure Injury 09/09/19 Sacrum Left;Right Stage 2 -  Partial thickness loss of dermis presenting as a shallow open injury with a red, pink wound bed without slough. (Active)  09/09/19 2300  Location: Sacrum  Location Orientation: Left;Right  Staging: Stage 2 -  Partial thickness loss of dermis presenting as a shallow open injury with a red, pink wound bed without slough.  Wound Description (Comments):   Present on Admission: Yes    Estimated body mass index is 28.06 kg/m as calculated from the following:   Height as of this encounter: 5\' 2"  (1.575 m).   Weight as of this encounter: 69.6 kg.   DVT prophylaxis: lovenox Family Communication:none Disposition Plan/Barrier to D/C: unable to determine Code Status:     Code Status Orders  (From admission, onward)         Start     Ordered   09/09/19 0551  Full code  Continuous     09/09/19 0550        Code Status History    Date Active Date Inactive Code Status Order ID Comments User Context   02/05/2015 2118 02/06/2015 2149 Full Code [id number]  Malvin Johns, MD ED  01/18/2015 0332 01/25/2015 1810 Full Code [id number]  Audery Amel, MD Inpatient   11/30/2014 1707 12/04/2014 2023 Full Code [id number]  Tyrone Nine, MD ED   11/13/2014 0240 11/14/2014 0111 Full Code  [id number]  Devoria Albe, MD ED   10/05/2014 2127 10/07/2014 0112 Full Code [id number]  Samuel Jester, DO ED   09/29/2014 2302 10/01/2014 1751 Full Code [id number]  Gilda Crease., MD ED   Advance Care Planning Activity    Advance Directive Documentation     Most Recent Value  Type of Advance Directive  Healthcare Power of Attorney  Pre-existing out of facility DNR order (yellow form or pink MOST form)  --  "MOST" Form in Place?  --        IV Access:    Peripheral IV   Procedures and diagnostic studies:   DG CHEST PORT 1 VIEW  Result Date: 09/10/2019 CLINICAL DATA:  Dyspnea. EXAM: PORTABLE CHEST 1 VIEW COMPARISON:  09/08/2019 FINDINGS: Heart size is normal. No pleural effusion identified. Asymmetric elevation of right hemidiaphragm, new. Multifocal airspace densities are identified throughout the right lung and appear new from previous exam. Left lung clear. IMPRESSION: 1. New right lung airspace densities compatible with pneumonia. 2. New asymmetric elevation of right hemidiaphragm. Electronically Signed   By: Signa Kell M.D.   On: 09/10/2019 12:37     Medical Consultants:    None.  Anti-Infectives:   IV remdesivir and Unasyn  Subjective:    Alexandria Harvey she is able to carry on a conversation, she relates she is hungry and does not want to go back to her group home, she relates her breathing is significantly better than when she came in.  Objective:    Vitals:   09/11/19 0400 09/11/19 0427 09/11/19 0500 09/11/19 0600  BP: 115/73     Pulse: 68  77 91  Resp: 18  15 20   Temp: 98.1 F (36.7 C)     TempSrc: Oral     SpO2: 94%  96% 98%  Weight:  69.6 kg    Height:       SpO2: 98 % O2 Flow Rate (L/min): 2 L/min   Intake/Output Summary (Last 24 hours) at 09/11/2019 0731 Last data filed at 09/11/2019 0600 Gross per 24 hour  Intake 1831.79 ml  Output 1850 ml  Net -18.21 ml   Filed Weights   09/09/19 2307 09/11/19 0427  Weight: 68.5 kg 69.6 kg     Exam: General exam: In no acute distress. Respiratory system: Good air movement and clear to auscultation. Cardiovascular system: S1 & S2 heard, RRR. No JVD. Gastrointestinal system: Abdomen is nondistended, soft and nontender.  Central nervous system: Alert and oriented Extremities: No pedal edema. Skin: No rashes, lesions or ulcers   Data Reviewed:    Labs: Basic Metabolic Panel: Recent Labs  Lab 09/08/19 1919 09/08/19 2105 09/09/19 1532 09/09/19 1558 09/10/19 0417  NA 135 138 139 139 139  K 5.2* 4.9 4.6 4.3 5.0  CL 101 105 108  --  108  CO2 20*  --  20*  --  22  GLUCOSE 93 73 127*  --  90  BUN 21* 27* 14  --  13  CREATININE 0.84 0.70 0.56  --  0.38*  CALCIUM 8.5*  --  7.7*  --  8.1*  MG  --   --   --   --  2.0  PHOS  --   --   --   --  2.4*  GFR Estimated Creatinine Clearance: 88 mL/min (A) (by C-G formula based on SCr of 0.38 mg/dL (L)). Liver Function Tests: Recent Labs  Lab 09/08/19 1919 09/10/19 0417  AST 120* 111*  ALT 13 15  ALKPHOS 43 37*  BILITOT 0.6 0.7  PROT 6.5 5.1*  ALBUMIN 2.5* 1.9*   No results for input(s): LIPASE, AMYLASE in the last 168 hours. No results for input(s): AMMONIA in the last 168 hours. Coagulation profile No results for input(s): INR, PROTIME in the last 168 hours. COVID-19 Labs  Recent Labs    09/08/19 2110 09/08/19 2142 09/10/19 0417  DDIMER 1.82*  --  1.24*  FERRITIN 558*  --  515*  LDH  --  465*  --   CRP 5.5*  --  8.4*    No results found for: SARSCOV2NAA  CBC: Recent Labs  Lab 09/08/19 1919 09/08/19 2105 09/09/19 1532 09/09/19 1558 09/10/19 0417  WBC 7.5  --  11.9*  --  9.4  NEUTROABS 5.4  --   --   --  6.6  HGB 12.9 12.6 10.2* 10.5* 10.0*  HCT 39.9 37.0 32.2* 31.0* 31.4*  MCV 91.5  --  92.8  --  91.0  PLT 129*  --  124*  --  128*   Cardiac Enzymes: No results for input(s): CKTOTAL, CKMB, CKMBINDEX, TROPONINI in the last 168 hours. BNP (last 3 results) No results for input(s): PROBNP in  the last 8760 hours. CBG: Recent Labs  Lab 09/10/19 1131 09/10/19 1634 09/10/19 2003 09/10/19 2255 09/11/19 0438  GLUCAP 199* 205* 178* 151* 149*   D-Dimer: Recent Labs    09/08/19 2110 09/10/19 0417  DDIMER 1.82* 1.24*   Hgb A1c: Recent Labs    09/09/19 0432  HGBA1C 6.3*   Lipid Profile: Recent Labs    09/08/19 2110  TRIG 194*   Thyroid function studies: No results for input(s): TSH, T4TOTAL, T3FREE, THYROIDAB in the last 72 hours.  Invalid input(s): FREET3 Anemia work up: Recent Labs    09/08/19 2110 09/10/19 0417  FERRITIN 558* 515*   Sepsis Labs: Recent Labs  Lab 09/08/19 1919 09/08/19 2142 09/09/19 0335 09/09/19 0726 09/09/19 1504 09/09/19 1532 09/09/19 1557 09/10/19 0417  PROCALCITON  --  0.15  --   --   --   --   --  0.14  WBC 7.5  --   --   --   --  11.9*  --  9.4  LATICACIDVEN  --   --  4.5* 3.5* 2.1*  --  2.5*  --    Microbiology Recent Results (from the past 240 hour(s))  Blood Culture (routine x 2)     Status: None (Preliminary result)   Collection Time: 09/08/19  7:41 PM   Specimen: BLOOD  Result Value Ref Range Status   Specimen Description BLOOD RIGHT ANTECUBITAL  Final   Special Requests   Final    BOTTLES DRAWN AEROBIC AND ANAEROBIC Blood Culture adequate volume   Culture   Final    NO GROWTH 2 DAYS Performed at [facility], [address] [place], [place] [address]    Report Status PENDING  Incomplete  Blood Culture (routine x 2)     Status: None (Preliminary result)   Collection Time: 09/08/19  7:58 PM   Specimen: BLOOD  Result Value Ref Range Status   Specimen Description BLOOD LEFT ANTECUBITAL  Final   Special Requests   Final    BOTTLES DRAWN AEROBIC AND ANAEROBIC Blood Culture adequate volume  Culture   Final    NO GROWTH 2 DAYS Performed at [facility], [address] [place], [place] [address]    Report Status PENDING  Incomplete  MRSA PCR Screening     Status: None   Collection Time: 09/09/19  12:30 AM   Specimen: Nasal Mucosa; Nasopharyngeal  Result Value Ref Range Status   MRSA by PCR NEGATIVE NEGATIVE Final    Comment:        The GeneXpert MRSA Assay (FDA approved for NASAL specimens only), is one component of a comprehensive MRSA colonization surveillance program. It is not intended to diagnose MRSA infection nor to guide or monitor treatment for MRSA infections. Performed at [facility], [address], [place], [place] [address]      Medications:   . acetaminophen  650 mg Rectal Once  . benztropine  1 mg Oral BID  . carbidopa-levodopa  1 tablet Oral TID  . dexamethasone (DECADRON) injection  6 mg Intravenous Q24H  . divalproex  1,000 mg Oral BID  . enoxaparin (LOVENOX) injection  40 mg Subcutaneous Q24H  . lubiprostone  24 mcg Oral Q breakfast  . pantoprazole  40 mg Oral Daily  . sodium chloride flush  3 mL Intravenous Q12H  . [START ON 09/12/2019] Vitamin D (Ergocalciferol)  50,000 Units Oral Once per day on Tue Fri   Continuous Infusions: . sodium chloride 75 mL/hr at 09/11/19 0600  . ampicillin-sulbactam (UNASYN) IV 3 g (09/11/19 0547)  . remdesivir 100 mg in NS 100 mL Stopped (09/10/19 1045)     LOS: 3 days   Marinda Elk  [facility]  09/11/2019, 7:31 AM

## 2019-09-12 ENCOUNTER — Ambulatory Visit: Payer: Medicare Other | Admitting: Neurology

## 2019-09-12 LAB — COMPREHENSIVE METABOLIC PANEL
ALT: 30 U/L (ref 0–44)
AST: 76 U/L — ABNORMAL HIGH (ref 15–41)
Albumin: 2 g/dL — ABNORMAL LOW (ref 3.5–5.0)
Alkaline Phosphatase: 48 U/L (ref 38–126)
Anion gap: 12 (ref 5–15)
BUN: 12 mg/dL (ref 6–20)
CO2: 26 mmol/L (ref 22–32)
Calcium: 8.2 mg/dL — ABNORMAL LOW (ref 8.9–10.3)
Chloride: 103 mmol/L (ref 98–111)
Creatinine, Ser: 0.48 mg/dL (ref 0.44–1.00)
GFR calc Af Amer: >60 mL/min (ref >60)
GFR calc non Af Amer: >60 mL/min (ref >60)
Glucose, Bld: 142 mg/dL — ABNORMAL HIGH (ref 70–99)
Potassium: 4.4 mmol/L (ref 3.5–5.1)
Sodium: 141 mmol/L (ref 135–145)
Total Bilirubin: 0.3 mg/dL (ref 0.3–1.2)
Total Protein: 5.5 g/dL — ABNORMAL LOW (ref 6.5–8.1)

## 2019-09-12 LAB — CBC WITH DIFFERENTIAL/PLATELET
Abs Immature Granulocytes: 0.4 10*3/uL — ABNORMAL HIGH (ref 0.00–0.07)
Band Neutrophils: 21 %
Basophils Absolute: 0 10*3/uL (ref 0.0–0.1)
Basophils Relative: 0 %
Eosinophils Absolute: 0 10*3/uL (ref 0.0–0.5)
Eosinophils Relative: 0 %
HCT: 32.2 % — ABNORMAL LOW (ref 36.0–46.0)
Hemoglobin: 10.3 g/dL — ABNORMAL LOW (ref 12.0–15.0)
Lymphocytes Relative: 14 %
Lymphs Abs: 1.5 10*3/uL (ref 0.7–4.0)
MCH: 28.5 pg (ref 26.0–34.0)
MCHC: 32 g/dL (ref 30.0–36.0)
MCV: 89.2 fL (ref 80.0–100.0)
Monocytes Absolute: 0.4 10*3/uL (ref 0.1–1.0)
Monocytes Relative: 4 %
Myelocytes: 4 %
Neutro Abs: 8.5 10*3/uL — ABNORMAL HIGH (ref 1.7–7.7)
Neutrophils Relative %: 57 %
Platelets: 236 10*3/uL (ref 150–400)
RBC: 3.61 MIL/uL — ABNORMAL LOW (ref 3.87–5.11)
RDW: 16.9 % — ABNORMAL HIGH (ref 11.5–15.5)
WBC Morphology: INCREASED
WBC: 10.9 10*3/uL — ABNORMAL HIGH (ref 4.0–10.5)
nRBC: 0.4 % — ABNORMAL HIGH (ref 0.0–0.2)

## 2019-09-12 LAB — GLUCOSE, CAPILLARY
Glucose-Capillary: 143 mg/dL — ABNORMAL HIGH (ref 70–99)
Glucose-Capillary: 150 mg/dL — ABNORMAL HIGH (ref 70–99)
Glucose-Capillary: 192 mg/dL — ABNORMAL HIGH (ref 70–99)
Glucose-Capillary: 182 mg/dL — ABNORMAL HIGH (ref 70–99)
Glucose-Capillary: 219 mg/dL — ABNORMAL HIGH (ref 70–99)
Glucose-Capillary: 185 mg/dL — ABNORMAL HIGH (ref 70–99)

## 2019-09-12 LAB — C-REACTIVE PROTEIN: CRP: 1.4 mg/dL — ABNORMAL HIGH (ref <1.0)

## 2019-09-12 LAB — PROCALCITONIN: Procalcitonin: <0.1 ng/mL

## 2019-09-12 LAB — D-DIMER, QUANTITATIVE: D-Dimer, Quant: 1.02 ug/mL-FEU — ABNORMAL HIGH (ref 0.00–0.50)

## 2019-09-12 NOTE — Progress Notes (Signed)
Called legal guardian, Benard Halsted, to give update on patients status. No answer, left voice message to callback

## 2019-09-12 NOTE — Progress Notes (Signed)
[facility] PROGRESS NOTE    Progress Note  Alexandria Harvey  ZOX:[id number] DOB: 12-12-1980 DOA: 09/08/2019 PCP: Gilda Crease, MD     Brief Narrative:   Alexandria Harvey is an 39 y.o. female past medical history significant for schizoaffective disorder, intellectual disability diabetes mellitus type 2 and essential hypertension who presents with significant decrease in level of consciousness that started a few days prior to admission.  As per facility the patient has not been conversing much for the last several days in the ED she was found to be hypoxic chest x-ray showed bilateral infiltrates and SARS-CoV-2 PCR was positive.  Assessment/Plan:   Acute respiratory failure with hypoxia secondarily to COVID-19 pneumonia/sepsis due to aspiration pneumonia: She has been weaned down to room air satting greater than 90%.  Her inflammatory markers are improving, she seems to be improving clinically. Culture data has remained negative. Sepsis physiology has resolved, will continue empiric antibiotics. Continue strict I's and O's and daily weights, patient can be transfer to the third floor, continue flutter valve and incentive spirometry.  Metabolic encephalopathy: Now resolved likely due to infectious etiology, CT of the head showed no acute intracranial findings.  Uncontrolled diabetes mellitus type 2 with hyperglycemia: Metformin held on admission, A1c is 6.3 blood glucose fairly controlled we will wean her steroids down.  Elevated LFTs: Likely due to infectious etiology.  Elevated D-dimer: Continue Lovenox for DVT prophylaxis elevation in D-dimer likely an acute phase reactant.  It is trending down.  Thrombocytopenia: Likely due to infectious etiology, now resolved.  Schizoaffective disorder/mood disorder: Continue current home medications.  Essential hypertension: The setting of sepsis continue to hold antihypertensive medication.  Anion gap metabolic acidosis due to  lactic acidosis: Improved with supportive care IV fluid hydration and IV antibiotics now resolved.  Hyperkalemia: She was given oral Kayexalate now resolved.  Normocytic anemia: Menstruating female likely iron deficiency replacing ferrous sulfate.    Moderate intellectual disabilities  Sacral decubitus ulcer stage II present on admission: RN Pressure Injury Documentation: Pressure Injury 09/09/19 Sacrum Left;Right Stage 2 -  Partial thickness loss of dermis presenting as a shallow open injury with a red, pink wound bed without slough. (Active)  09/09/19 2300  Location: Sacrum  Location Orientation: Left;Right  Staging: Stage 2 -  Partial thickness loss of dermis presenting as a shallow open injury with a red, pink wound bed without slough.  Wound Description (Comments):   Present on Admission: Yes    Estimated body mass index is 28.06 kg/m as calculated from the following:   Height as of this encounter: 5\' 2"  (1.575 m).   Weight as of this encounter: 69.6 kg.   DVT prophylaxis: lovenox Family Communication:none Disposition Plan/Barrier to D/C: unable to determine Code Status:     Code Status Orders  (From admission, onward)         Start     Ordered   09/09/19 0551  Full code  Continuous     09/09/19 0550        Code Status History    Date Active Date Inactive Code Status Order ID Comments User Context   02/05/2015 2118 02/06/2015 2149 Full Code 2150  [id number], MD ED   01/18/2015 0332 01/25/2015 1810 Full Code 01/27/2015  [id number], MD Inpatient   11/30/2014 1707 12/04/2014 2023 Full Code 12/06/2014  [id number], MD ED   11/13/2014 0240 11/14/2014 0111 Full Code 01/14/2015  [id number], MD ED   10/05/2014 2127 10/07/2014 0112 Full Code  [id number]  Francine Graven, DO ED   09/29/2014 2302 10/01/2014 1751 Full Code [id number]  Orpah Greek., MD ED   Advance Care Planning Activity    Advance Directive Documentation     Most Recent Value  Type of Advance  Directive  Healthcare Power of Attorney  Pre-existing out of facility DNR order (yellow form or pink MOST form)  --  "MOST" Form in Place?  --        IV Access:    Peripheral IV   Procedures and diagnostic studies:   DG CHEST PORT 1 VIEW  Result Date: 09/10/2019 CLINICAL DATA:  Dyspnea. EXAM: PORTABLE CHEST 1 VIEW COMPARISON:  09/08/2019 FINDINGS: Heart size is normal. No pleural effusion identified. Asymmetric elevation of right hemidiaphragm, new. Multifocal airspace densities are identified throughout the right lung and appear new from previous exam. Left lung clear. IMPRESSION: 1. New right lung airspace densities compatible with pneumonia. 2. New asymmetric elevation of right hemidiaphragm. Electronically Signed   By: Kerby Moors M.D.   On: 09/10/2019 12:37     Medical Consultants:    None.  Anti-Infectives:   IV remdesivir and Unasyn  Subjective:    Alexandria Harvey no new complaints today.  Objective:    Vitals:   09/11/19 0743 09/11/19 1200 09/11/19 1930 09/12/19 0518  BP: 113/69  117/83 120/73  Pulse: 70  79 66  Resp:   16 17  Temp: 97.6 F (36.4 C) 98.5 F (36.9 C) 98.1 F (36.7 C) 98 F (36.7 C)  TempSrc: Axillary Oral Axillary Axillary  SpO2:   96% 93%  Weight:      Height:       SpO2: 93 % O2 Flow Rate (L/min): 2 L/min   Intake/Output Summary (Last 24 hours) at 09/12/2019 0712 Last data filed at 09/12/2019 0517 Gross per 24 hour  Intake 1170 ml  Output 3300 ml  Net -2130 ml   Filed Weights   09/09/19 2307 09/11/19 0427  Weight: 68.5 kg 69.6 kg    Exam: General exam: In no acute distress. Respiratory system: Good air movement and diffuse crackles bilaterally. Cardiovascular system: S1 & S2 heard, RRR. No JVD. Gastrointestinal system: Abdomen is nondistended, soft and nontender.  Central nervous system: Alert and oriented. No focal neurological deficits. Extremities: No pedal edema. Skin: No rashes, lesions or ulcers Psychiatry:  Judgement and insight appear normal. Mood & affect appropriate.   Data Reviewed:    Labs: Basic Metabolic Panel: Recent Labs  Lab 09/08/19 1919 09/08/19 2105 09/09/19 1532 09/09/19 1558 09/10/19 0417 09/11/19 0455  NA 135 138 139 139 139 140  K 5.2* 4.9 4.6 4.3 5.0 4.5  CL 101 105 108  --  108 104  CO2 20*  --  20*  --  22 23  GLUCOSE 93 73 127*  --  90 158*  BUN 21* 27* 14  --  13 9  CREATININE 0.84 0.70 0.56  --  0.38* 0.45  CALCIUM 8.5*  --  7.7*  --  8.1* 8.2*  MG  --   --   --   --  2.0  --   PHOS  --   --   --   --  2.4*  --    GFR Estimated Creatinine Clearance: 88 mL/min (by C-G formula based on SCr of 0.45 mg/dL). Liver Function Tests: Recent Labs  Lab 09/08/19 1919 09/10/19 0417 09/11/19 0455  AST 120* 111* 83*  ALT 13 15 23   ALKPHOS 43 37* 40  BILITOT 0.6 0.7 0.6  PROT 6.5 5.1* 5.0*  ALBUMIN 2.5* 1.9* 1.8*   No results for input(s): LIPASE, AMYLASE in the last 168 hours. No results for input(s): AMMONIA in the last 168 hours. Coagulation profile No results for input(s): INR, PROTIME in the last 168 hours. COVID-19 Labs  Recent Labs    09/10/19 0417 09/11/19 0455  DDIMER 1.24* 1.08*  FERRITIN 515*  --   CRP 8.4* 3.4*    No results found for: SARSCOV2NAA  CBC: Recent Labs  Lab 09/08/19 1919 09/08/19 2105 09/09/19 1532 09/09/19 1558 09/10/19 0417 09/11/19 0455  WBC 7.5  --  11.9*  --  9.4 9.3  NEUTROABS 5.4  --   --   --  6.6 6.0  HGB 12.9 12.6 10.2* 10.5* 10.0* 9.8*  HCT 39.9 37.0 32.2* 31.0* 31.4* 30.5*  MCV 91.5  --  92.8  --  91.0 91.0  PLT 129*  --  124*  --  128* 168   Cardiac Enzymes: No results for input(s): CKTOTAL, CKMB, CKMBINDEX, TROPONINI in the last 168 hours. BNP (last 3 results) No results for input(s): PROBNP in the last 8760 hours. CBG: Recent Labs  Lab 09/11/19 1112 09/11/19 1649 09/11/19 1938 09/11/19 2328 09/12/19 0424  GLUCAP 214* 163* 140* 89 143*   D-Dimer: Recent Labs    09/10/19 0417  09/11/19 0455  DDIMER 1.24* 1.08*   Hgb A1c: No results for input(s): HGBA1C in the last 72 hours. Lipid Profile: No results for input(s): CHOL, HDL, LDLCALC, TRIG, CHOLHDL, LDLDIRECT in the last 72 hours. Thyroid function studies: No results for input(s): TSH, T4TOTAL, T3FREE, THYROIDAB in the last 72 hours.  Invalid input(s): FREET3 Anemia work up: Recent Labs    09/10/19 0417  FERRITIN 515*   Sepsis Labs: Recent Labs  Lab 09/08/19 1919 09/08/19 2142 09/09/19 0335 09/09/19 0726 09/09/19 1504 09/09/19 1532 09/09/19 1557 09/10/19 0417 09/11/19 0455  PROCALCITON  --  0.15  --   --   --   --   --  0.14 <0.10  WBC 7.5  --   --   --   --  11.9*  --  9.4 9.3  LATICACIDVEN  --   --  4.5* 3.5* 2.1*  --  2.5*  --   --    Microbiology Recent Results (from the past 240 hour(s))  Blood Culture (routine x 2)     Status: None (Preliminary result)   Collection Time: 09/08/19  7:41 PM   Specimen: BLOOD  Result Value Ref Range Status   Specimen Description BLOOD RIGHT ANTECUBITAL  Final   Special Requests   Final    BOTTLES DRAWN AEROBIC AND ANAEROBIC Blood Culture adequate volume   Culture   Final    NO GROWTH 3 DAYS Performed at [facility], [address], [place], [place] [address]    Report Status PENDING  Incomplete  Blood Culture (routine x 2)     Status: None (Preliminary result)   Collection Time: 09/08/19  7:58 PM   Specimen: BLOOD  Result Value Ref Range Status   Specimen Description BLOOD LEFT ANTECUBITAL  Final   Special Requests   Final    BOTTLES DRAWN AEROBIC AND ANAEROBIC Blood Culture adequate volume   Culture   Final    NO GROWTH 3 DAYS Performed at [facility], [address], [place], [place] [address]    Report Status PENDING  Incomplete  MRSA PCR Screening     Status:  None   Collection Time: 09/09/19 12:30 AM   Specimen: Nasal Mucosa; Nasopharyngeal  Result Value Ref Range Status   MRSA by PCR NEGATIVE NEGATIVE Final    Comment:         The GeneXpert MRSA Assay (FDA approved for NASAL specimens only), is one component of a comprehensive MRSA colonization surveillance program. It is not intended to diagnose MRSA infection nor to guide or monitor treatment for MRSA infections. Performed at [facility], [address], [place], [place] [address]      Medications:   . acetaminophen  650 mg Rectal Once  . benztropine  1 mg Oral BID  . carbidopa-levodopa  1 tablet Oral TID  . dexamethasone (DECADRON) injection  6 mg Intravenous Q24H  . divalproex  1,000 mg Oral BID  . enoxaparin (LOVENOX) injection  40 mg Subcutaneous Q24H  . ferrous sulfate  325 mg Oral TID WC  . lubiprostone  24 mcg Oral Q breakfast  . pantoprazole  40 mg Oral Daily  . polyethylene glycol  17 g Oral Daily  . sodium chloride flush  3 mL Intravenous Q12H  . Vitamin D (Ergocalciferol)  50,000 Units Oral Once per day on Tue Fri   Continuous Infusions: . ampicillin-sulbactam (UNASYN) IV 3 g (09/12/19 0517)  . remdesivir 100 mg in NS 100 mL Stopped (09/11/19 1045)     LOS: 4 days   Marinda Elk  [facility]  09/12/2019, 7:12 AM

## 2019-09-12 NOTE — Evaluation (Addendum)
Physical Therapy Evaluation Patient Details Name: Alexandria Harvey MRN: [id number] DOB: 01-03-1981 Today's Date: 09/12/2019   History of Present Illness  Lindyn Vossler is an 39 y.o. female past medical history significant for schizoaffective disorder,seizures, intellectual disability diabetes mellitus type 2 and essential hypertension who presents with significant decrease in level of consciousness that started a few days prior to admission.  As per facility the patient has not been conversing much for the last several days in the ED she was found to be hypoxic chest x-ray showed bilateral infiltrates and SARS-CoV-2 PCR was positive  Clinical Impression  The patient  Received awake, follows therapist, initially mute and not verbally responsive. Patient did follow a few 1 step simple commands. Patient did spontaneous speak and ask for a soda and stated a coke. The patient did participate in  Mobility and actually placed her legs back onto bed and scooted around in the bed.Patient did have OPPT in 9/20  Due to falls, indicates that patient was independently ambulating, was started on Sinemet for Parkinson's like gait. Message left  For LEGAL GUARDIAN  Listed in chart to gather more information about recent prior level of function..  Patient did perform IS x 10 with cues. Encouraged coughing. Patient on RA, SPO2 95%. Pt admitted with above diagnosis.  Pt currently with functional limitations due to the deficits listed below (see PT Problem List). Pt will benefit from skilled PT to increase their independence and safety with mobility to allow discharge to the venue listed below.        Follow Up Recommendations SNF;Supervision/Assistance - 24 hour(vs return to  Group home)    Equipment Recommendations  (TBA)    Recommendations for Other Services   OT    Precautions / Restrictions Precautions Precautions: Fall ,seizures Precaution Comments: has had multiple falls, being followed by neurology for  Parkinson's like gait, also appears to have had an LP back in 9/20 for r/o NPH.      Mobility  Bed Mobility Overal bed mobility: Needs Assistance Bed Mobility: Rolling;Sidelying to Sit;Sit to Supine Rolling: Max assist Sidelying to sit: Max assist   Sit to supine: Mod assist   General bed mobility comments: after PT  initiated rolling, the  patient would reach arms and roll pelvis to assist, Patient also scoots hips around the bed to self reposition, Max assist to sit patient up on bed edge using bed pads. Patient sat  for about 1 minute with mod assist, listing to the right. Patient stated" I want to lie down" and began to place legs onto bed and scoot back into bed.  Transfers                 General transfer comment: nt  Ambulation/Gait                Stairs            Wheelchair Mobility    Modified Rankin (Stroke Patients Only)       Balance Overall balance assessment: History of Falls;Needs assistance(multiple) Sitting-balance support: Single extremity supported;Feet unsupported Sitting balance-Leahy Scale: Poor Sitting balance - Comments: lists to right                                     Pertinent Vitals/Pain Pain Assessment: Faces Faces Pain Scale: Hurts a little bit Pain Location: patient had emotional outburst  but did not indicate that anything was  hurting Pain Descriptors / Indicators: Crying Pain Intervention(s): Monitored during session    Home Living Family/patient expects to be discharged to:: Group home                      Prior Function Level of Independence: Needs assistance   Gait / Transfers Assistance Needed: per chart, has had multiple falls. Per PT notes  from [place] 9/20, patient was independently ambulating W/O AD           Hand Dominance        Extremity/Trunk Assessment   Upper Extremity Assessment Upper Extremity Assessment: Defer to OT evaluation(able to reach for and hold cup to  drink.)    Lower Extremity Assessment Lower Extremity Assessment: RLE deficits/detail;LLE deficits/detail RLE Deficits / Details: noted bruising over  the knee, did not appear to be painful when knee flexed. patient did not actively move the leg to command but did place the leg on the bed and moved it  to roll. LLE Deficits / Details: similar to right but less bruised knee vs right. did lift leg to place back onto bed.       Communication      Cognition Arousal/Alertness: Awake/alert Behavior During Therapy: Flat affect Overall Cognitive Status: No family/caregiver present to determine baseline cognitive functioning Area of Impairment: Orientation;Attention;Following commands;Awareness;Problem solving                 Orientation Level: Place;Time;Situation Current Attention Level: Selective   Following Commands: Follows one step commands inconsistently   Awareness: Intellectual Problem Solving: Slow processing;Decreased initiation;Requires verbal cues General Comments: Initially patient did not respond other than visually. patient did answer question to her name. later on she spontaneously asked"can i have a soda?", then asked if she wanted a sprite, coke or GA, she responded with " a coke'. patient also stated" I want to lie down "      General Comments      Exercises Other Exercises Other Exercises: IS x 10   Assessment/Plan    PT Assessment Patient needs continued PT services  PT Problem List Decreased strength;Decreased mobility;Decreased safety awareness;Decreased coordination;Decreased knowledge of precautions;Decreased activity tolerance;Decreased cognition;Decreased balance       PT Treatment Interventions DME instruction;Therapeutic activities;Cognitive remediation;Gait training;Therapeutic exercise;Patient/family education;Balance training;Functional mobility training    PT Goals (Current goals can be found in the Care Plan section)  Acute Rehab PT  Goals PT Goal Formulation: Patient unable to participate in goal setting Time For Goal Achievement: 09/26/19 Potential to Achieve Goals: Fair    Frequency Min 2X/week   Barriers to discharge        Co-evaluation               AM-PAC PT "6 Clicks" Mobility  Outcome Measure Help needed turning from your back to your side while in a flat bed without using bedrails?: Total Help needed moving from lying on your back to sitting on the side of a flat bed without using bedrails?: Total Help needed moving to and from a bed to a chair (including a wheelchair)?: Total Help needed standing up from a chair using your arms (e.g., wheelchair or bedside chair)?: Total Help needed to walk in hospital room?: Total Help needed climbing 3-5 steps with a railing? : Total 6 Click Score: 6    End of Session   Activity Tolerance: Patient tolerated treatment well Patient left: in bed;with call bell/phone within reach;with bed alarm set(and bed pad alrm to box) Nurse  Communication: Mobility status PT Visit Diagnosis: Unsteadiness on feet (R26.81);Difficulty in walking, not elsewhere classified (R26.2);Repeated falls (R29.6);Other symptoms and signs involving the nervous system (R29.898)    Time: 6195-0932 PT Time Calculation (min) (ACUTE ONLY): 30 min   Charges:   PT Evaluation $PT Eval Moderate Complexity: 1 Mod PT Treatments $Therapeutic Activity: 8-22 mins        Blanchard Kelch PT Acute Rehabilitation Services Pager [phone] Office [phone]   Rada Hay 09/12/2019, 12:59 PM

## 2019-09-12 NOTE — Progress Notes (Signed)
Patient has been transferred to Rm [id number] from Rm [id number].

## 2019-09-13 LAB — C-REACTIVE PROTEIN: CRP: 2.2 mg/dL — ABNORMAL HIGH (ref <1.0)

## 2019-09-13 LAB — CBC WITH DIFFERENTIAL/PLATELET
Abs Immature Granulocytes: 1.65 10*3/uL — ABNORMAL HIGH (ref 0.00–0.07)
Basophils Absolute: 0 10*3/uL (ref 0.0–0.1)
Basophils Relative: 0 %
Eosinophils Absolute: 0 10*3/uL (ref 0.0–0.5)
Eosinophils Relative: 0 %
HCT: 35.6 % — ABNORMAL LOW (ref 36.0–46.0)
Hemoglobin: 11.5 g/dL — ABNORMAL LOW (ref 12.0–15.0)
Immature Granulocytes: 10 %
Lymphocytes Relative: 20 %
Lymphs Abs: 3.3 10*3/uL (ref 0.7–4.0)
MCH: 29 pg (ref 26.0–34.0)
MCHC: 32.3 g/dL (ref 30.0–36.0)
MCV: 89.9 fL (ref 80.0–100.0)
Monocytes Absolute: 1.1 10*3/uL — ABNORMAL HIGH (ref 0.1–1.0)
Monocytes Relative: 7 %
Neutro Abs: 10.4 10*3/uL — ABNORMAL HIGH (ref 1.7–7.7)
Neutrophils Relative %: 63 %
Platelets: 279 10*3/uL (ref 150–400)
RBC: 3.96 MIL/uL (ref 3.87–5.11)
RDW: 17.2 % — ABNORMAL HIGH (ref 11.5–15.5)
WBC: 16.4 10*3/uL — ABNORMAL HIGH (ref 4.0–10.5)
nRBC: 0.3 % — ABNORMAL HIGH (ref 0.0–0.2)

## 2019-09-13 LAB — CULTURE, BLOOD (ROUTINE X 2)
Culture: NO GROWTH
Culture: NO GROWTH
Special Requests: ADEQUATE
Special Requests: ADEQUATE

## 2019-09-13 LAB — COMPREHENSIVE METABOLIC PANEL
ALT: 19 U/L (ref 0–44)
AST: 54 U/L — ABNORMAL HIGH (ref 15–41)
Albumin: 2.2 g/dL — ABNORMAL LOW (ref 3.5–5.0)
Alkaline Phosphatase: 48 U/L (ref 38–126)
Anion gap: 12 (ref 5–15)
BUN: 14 mg/dL (ref 6–20)
CO2: 27 mmol/L (ref 22–32)
Calcium: 8.3 mg/dL — ABNORMAL LOW (ref 8.9–10.3)
Chloride: 102 mmol/L (ref 98–111)
Creatinine, Ser: 0.42 mg/dL — ABNORMAL LOW (ref 0.44–1.00)
GFR calc Af Amer: >60 mL/min (ref >60)
GFR calc non Af Amer: >60 mL/min (ref >60)
Glucose, Bld: 111 mg/dL — ABNORMAL HIGH (ref 70–99)
Potassium: 4.3 mmol/L (ref 3.5–5.1)
Sodium: 141 mmol/L (ref 135–145)
Total Bilirubin: 0.8 mg/dL (ref 0.3–1.2)
Total Protein: 5.7 g/dL — ABNORMAL LOW (ref 6.5–8.1)

## 2019-09-13 LAB — GLUCOSE, CAPILLARY
Glucose-Capillary: 98 mg/dL (ref 70–99)
Glucose-Capillary: 105 mg/dL — ABNORMAL HIGH (ref 70–99)
Glucose-Capillary: 161 mg/dL — ABNORMAL HIGH (ref 70–99)

## 2019-09-13 LAB — D-DIMER, QUANTITATIVE: D-Dimer, Quant: 1.48 ug/mL-FEU — ABNORMAL HIGH (ref 0.00–0.50)

## 2019-09-13 MED ORDER — CLOZAPINE 25 MG PO TABS
25.0000 mg | ORAL_TABLET | Freq: Two times a day (BID) | ORAL | Status: DC
  Administered 2019-09-13 – 2019-09-16 (×5): 25 mg via ORAL
  Filled 2019-09-13 (×9): qty 1

## 2019-09-13 MED ORDER — AMOXICILLIN-POT CLAVULANATE 875-125 MG PO TABS
1.0000 | ORAL_TABLET | Freq: Two times a day (BID) | ORAL | Status: DC
  Administered 2019-09-13 – 2019-09-16 (×5): 1 via ORAL
  Filled 2019-09-13 (×8): qty 1

## 2019-09-13 MED ORDER — SACCHAROMYCES BOULARDII 250 MG PO CAPS
250.0000 mg | ORAL_CAPSULE | Freq: Two times a day (BID) | ORAL | Status: DC
  Administered 2019-09-13 – 2019-09-16 (×5): 250 mg via ORAL
  Filled 2019-09-13 (×9): qty 1

## 2019-09-13 NOTE — Progress Notes (Signed)
Called legal guardian, Benard Halsted, to give an update on patients status. Received no answer. Voice message left to call back

## 2019-09-13 NOTE — Progress Notes (Addendum)
[facility] PROGRESS NOTE    Progress Note  Alexandria Harvey  MOL:[id number] DOB: 1981-07-29 DOA: 09/08/2019 PCP: Alexandria Sago, MD     Brief Narrative:   Alexandria Harvey is an 39 y.o. female past medical history significant for schizoaffective disorder, intellectual disability diabetes mellitus type 2 and essential hypertension who presents with significant decrease in level of consciousness that started a few days prior to admission.  As per facility the patient has not been conversing much for the last several days in the ED she was found to be hypoxic chest x-ray showed bilateral infiltrates and SARS-CoV-2 PCR was positive.  Assessment/Plan:   Acute respiratory failure with hypoxia secondarily to COVID-19 pneumonia/sepsis due to aspiration pneumonia:  Recent Labs  Lab 09/08/19 1919 09/08/19 2110 09/08/19 2142 09/10/19 0417 09/11/19 0455 09/12/19 0520 09/13/19 0220  DDIMER  --  1.82*  --  1.24* 1.08* 1.02*  --   FERRITIN  --  558*  --  515*  --   --   --   CRP  --  5.5*  --  8.4* 3.4* 1.4*  --   ALT 13  --   --  15 23 30 19   PROCALCITON  --   --  0.15 0.14 <0.10 <0.10  --     Patient was started on remdesivir and steroids.  Patient currently saturating normal on room air.  It appears that steroids were discontinued yesterday.  Inflammatory markers were elevated and appeared to be improving.  Procalcitonin has been less than 0.1.  Due to concern for aspiration patient was also placed on Unasyn.  Will change to Augmentin.  She will complete course of remdesivir today.  Sepsis physiology has resolved.  Continue incentive spirometry mobilization as much as tolerated as much as patient will cooperate.  Acute Metabolic encephalopathy: Likely due to COVID-19.  CT head did not show any acute findings.  Now seems to be back to her baseline.    History of schizoaffective disorder/intellectual disability Patient lives in a group home.  Seems to be close to her baseline.   Has a legal guardian.  Would prefer that she be able to return to her group home when stable.  Uncontrolled diabetes mellitus type 2 with hyperglycemia: HbA1c 6.3.  Patient on Metformin at home which was held at admission.  Monitor CBGs.    Elevated LFTs: Likely due to infectious etiology.  Improving gradually.  Elevated D-dimer: Continue Lovenox for DVT prophylaxis elevation in D-dimer likely an acute phase reactant.  It is trending down.  Thrombocytopenia: Likely due to infectious etiology, now resolved.  Essential hypertension: Pressure is reasonably well controlled.  Continue to hold her antihypertensives.    Anion gap metabolic acidosis due to lactic acidosis: Improved with supportive care IV fluid hydration and IV antibiotics.  Hyperkalemia: She was given oral Kayexalate. Now resolved.  Normocytic anemia: Stable.  No evidence of overt blood loss.  Sacral decubitus ulcer stage II present on admission: RN Pressure Injury Documentation: Pressure Injury 09/09/19 Sacrum Left;Right Stage 2 -  Partial thickness loss of dermis presenting as a shallow open injury with a red, pink wound bed without slough. (Active)  09/09/19 2300  Location: Sacrum  Location Orientation: Left;Right  Staging: Stage 2 -  Partial thickness loss of dermis presenting as a shallow open injury with a red, pink wound bed without slough.  Wound Description (Comments):   Present on Admission: Yes    DVT prophylaxis: lovenox CODE STATUS: Full code Family Communication: Will attempt calling  legal guardian later today Disposition Plan: Hopefully return to group home when improved.    IV Access:    Peripheral IV   Procedures and diagnostic studies:   No results found.   Medical Consultants:    None.  Anti-Infectives:   IV remdesivir and Unasyn  Subjective:    Difficult to understand the patient due to her intellectual disabilities.  Does not appear to be in any distress.  Objective:      Vitals:   09/12/19 2005 09/13/19 0435 09/13/19 0500 09/13/19 0730  BP: 129/81 121/81  118/78  Pulse: 89 85  80  Resp: 16 18  18   Temp: 98.7 F (37.1 C) 98.1 F (36.7 C)  98.1 F (36.7 C)  TempSrc: Oral Oral  Axillary  SpO2: 96% 96%  97%  Weight:   67.7 kg   Height:       SpO2: 97 % O2 Flow Rate (L/min): 0 L/min   Intake/Output Summary (Last 24 hours) at 09/13/2019 1407 Last data filed at 09/13/2019 0500 Gross per 24 hour  Intake 300 ml  Output 1350 ml  Net -1050 ml   Filed Weights   09/09/19 2307 09/11/19 0427 09/13/19 0500  Weight: 68.5 kg 69.6 kg 67.7 kg    General appearance: She is awake alert.  Not very communicative. Resp: Normal effort at rest.  Coarse breath sound.  With few crackles at the bases. Cardio: S1-S2 is normal regular.  No S3-S4.  No rubs murmurs or bruit GI: Abdomen is soft.  Nontender nondistended.  Bowel sounds are present normal.  No masses organomegaly Extremities: No edema.  Full range of motion of lower extremities. Neurologic: No obvious focal deficits.   Data Reviewed:    Labs: Basic Metabolic Panel: Recent Labs  Lab 09/09/19 1532 09/09/19 1558 09/10/19 0417 09/11/19 0455 09/12/19 0520 09/13/19 0220  NA 139 139 139 140 141 141  K 4.6 4.3 5.0 4.5 4.4 4.3  CL 108  --  108 104 103 102  CO2 20*  --  22 23 26 27   GLUCOSE 127*  --  90 158* 142* 111*  BUN 14  --  13 9 12 14   CREATININE 0.56  --  0.38* 0.45 0.48 0.42*  CALCIUM 7.7*  --  8.1* 8.2* 8.2* 8.3*  MG  --   --  2.0  --   --   --   PHOS  --   --  2.4*  --   --   --    GFR Estimated Creatinine Clearance: 86.8 mL/min (A) (by C-G formula based on SCr of 0.42 mg/dL (L)). Liver Function Tests: Recent Labs  Lab 09/08/19 1919 09/10/19 0417 09/11/19 0455 09/12/19 0520 09/13/19 0220  AST 120* 111* 83* 76* 54*  ALT 13 15 23 30 19   ALKPHOS 43 37* 40 48 48  BILITOT 0.6 0.7 0.6 0.3 0.8  PROT 6.5 5.1* 5.0* 5.5* 5.7*  ALBUMIN 2.5* 1.9* 1.8* 2.0* 2.2*    CBC: Recent Labs   Lab 09/08/19 1919 09/09/19 1532 09/09/19 1558 09/10/19 0417 09/11/19 0455 09/12/19 0520  WBC 7.5 11.9*  --  9.4 9.3 10.9*  NEUTROABS 5.4  --   --  6.6 6.0 8.5*  HGB 12.9 10.2* 10.5* 10.0* 9.8* 10.3*  HCT 39.9 32.2* 31.0* 31.4* 30.5* 32.2*  MCV 91.5 92.8  --  91.0 91.0 89.2  PLT 129* 124*  --  128* 168 236   CBG: Recent Labs  Lab 09/12/19 1214 09/12/19 1700 09/12/19 2100 09/13/19 0756  09/13/19 1208  GLUCAP 182* 219* 185* 98 105*   D-Dimer: Recent Labs    09/11/19 0455 09/12/19 0520  DDIMER 1.08* 1.02*   Sepsis Labs: Recent Labs  Lab 09/08/19 2142 09/09/19 0335 09/09/19 0726 09/09/19 1504 09/09/19 1532 09/09/19 1557 09/10/19 0417 09/11/19 0455 09/12/19 0520  PROCALCITON 0.15  --   --   --   --   --  0.14 <0.10 <0.10  WBC  --   --   --   --  11.9*  --  9.4 9.3 10.9*  LATICACIDVEN  --  4.5* 3.5* 2.1*  --  2.5*  --   --   --    Microbiology Recent Results (from the past 240 hour(s))  Blood Culture (routine x 2)     Status: None   Collection Time: 09/08/19  7:41 PM   Specimen: BLOOD  Result Value Ref Range Status   Specimen Description BLOOD RIGHT ANTECUBITAL  Final   Special Requests   Final    BOTTLES DRAWN AEROBIC AND ANAEROBIC Blood Culture adequate volume   Culture   Final    NO GROWTH 5 DAYS Performed at [facility], [address] [place], [place] [address]    Report Status 09/13/2019 FINAL  Final  Blood Culture (routine x 2)     Status: None   Collection Time: 09/08/19  7:58 PM   Specimen: BLOOD  Result Value Ref Range Status   Specimen Description BLOOD LEFT ANTECUBITAL  Final   Special Requests   Final    BOTTLES DRAWN AEROBIC AND ANAEROBIC Blood Culture adequate volume   Culture   Final    NO GROWTH 5 DAYS Performed at [facility], [address], [place], [place] [address]    Report Status 09/13/2019 FINAL  Final  MRSA PCR Screening     Status: None   Collection Time: 09/09/19 12:30 AM   Specimen: Nasal Mucosa;  Nasopharyngeal  Result Value Ref Range Status   MRSA by PCR NEGATIVE NEGATIVE Final    Comment:        The GeneXpert MRSA Assay (FDA approved for NASAL specimens only), is one component of a comprehensive MRSA colonization surveillance program. It is not intended to diagnose MRSA infection nor to guide or monitor treatment for MRSA infections. Performed at [facility], [address], [place], [place] [address]      Medications:   . acetaminophen  650 mg Rectal Once  . benztropine  1 mg Oral BID  . carbidopa-levodopa  1 tablet Oral TID  . divalproex  1,000 mg Oral BID  . enoxaparin (LOVENOX) injection  40 mg Subcutaneous Q24H  . ferrous sulfate  325 mg Oral TID WC  . lubiprostone  24 mcg Oral Q breakfast  . pantoprazole  40 mg Oral Daily  . sodium chloride flush  3 mL Intravenous Q12H  . Vitamin D (Ergocalciferol)  50,000 Units Oral Once per day on Tue Fri   Continuous Infusions: . ampicillin-sulbactam (UNASYN) IV 3 g (09/13/19 1308)     LOS: 5 days   [employer]  [facility]  09/13/2019, 2:07 PM

## 2019-09-13 NOTE — Evaluation (Signed)
Clinical/Bedside Swallow Evaluation Patient Details  Name: Alexandria Harvey MRN: [id number] Date of Birth: 01/22/1981  Today's Date: 09/13/2019 Time: SLP Start Time (ACUTE ONLY): 1535 SLP Stop Time (ACUTE ONLY): 1543 SLP Time Calculation (min) (ACUTE ONLY): 8 min  Past Medical History:  Past Medical History:  Diagnosis Date  . Diabetes mellitus without complication ([place])   . 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 Excessive anger   . GERD (gastroesophageal reflux disease)   . Hallucinations   . Hyperlipidemia   . Hypertension   . Mild mental retardation   . Schizophrenia ([place])   . Seizures ([place])   . Vitamin D deficiency    Past Surgical History:  Past Surgical History:  Procedure Laterality Date  . NO PAST SURGERIES     HPI:  Alexandria Harvey is an 39 y.o. female past medical history significant for schizoaffective disorder, intellectual disability, seizures,  diabetes mellitus type 2 and essential hypertension who presents with significant decrease in level of consciousness that started a few days prior to admission.  As per facility the patient has not been conversing much for the last several days in the ED she was found to be hypoxic chest x-ray showed bilateral infiltrates and SARS-CoV-2 PCR was positive   Assessment / Plan / Recommendation Clinical Impression  Pts primary impairment is dry mouth. Though she is able to [facility], she takes a while to form a bolus and doesnt appear to produce adequate saliva. Regardless, with most regular textures she will manage well enough and diet texture downgrade is not warranted. Some group homes will not accept pts with modified diets. No signs of aspiration. Will sign off.  SLP Visit Diagnosis: Dysphagia, unspecified (R13.10)    Aspiration Risk  Mild aspiration risk    Diet Recommendation Regular;Thin liquid   Liquid Administration via: Cup;Straw Medication Administration: Whole meds with liquid Supervision: Staff to assist with self  feeding Compensations: Slow rate;Small sips/bites Postural Changes: Seated upright at 90 degrees    Other  Recommendations Oral Care Recommendations: Oral care BID   Follow up Recommendations None      Frequency and Duration            Prognosis        Swallow Study   General HPI: Alexandria Harvey is an 39 y.o. female past medical history significant for schizoaffective disorder, intellectual disability, seizures,  diabetes mellitus type 2 and essential hypertension who presents with significant decrease in level of consciousness that started a few days prior to admission.  As per facility the patient has not been conversing much for the last several days in the ED she was found to be hypoxic chest x-ray showed bilateral infiltrates and SARS-CoV-2 PCR was positive Type of Study: Bedside Swallow Evaluation Diet Prior to this Study: Regular;Thin liquids Temperature Spikes Noted: No Respiratory Status: Room air History of Recent Intubation: No Behavior/Cognition: Alert;Cooperative Oral Cavity Assessment: Dry Oral Care Completed by SLP: No Oral Cavity - Dentition: Adequate natural dentition Self-Feeding Abilities: Total assist Patient Positioning: Upright in bed Baseline Vocal Quality: Not observed Volitional Cough: Strong Volitional Swallow: Unable to elicit    Oral/Motor/Sensory Function Overall Oral Motor/Sensory Function: Within functional limits   Ice Chips     Thin Liquid Thin Liquid: Within functional limits Presentation: Straw    Nectar Thick Nectar Thick Liquid: Not tested   Honey Thick Honey Thick Liquid: Not tested   Puree Puree: Not tested   Solid     Solid: Impaired Oral Phase Functional Implications: Prolonged  oral transit     Alexandria Ditty, MA CCC-SLP  Acute Rehabilitation Services Pager [phone] Office [phone]  Alexandria Harvey 09/13/2019,3:51 PM

## 2019-09-13 NOTE — Plan of Care (Signed)
  Problem: Coping: Goal: Psychosocial and spiritual needs will be supported Outcome: Progressing   Problem: Respiratory: Goal: Will maintain a patent airway Outcome: Progressing Goal: Complications related to the disease process, condition or treatment will be avoided or minimized Outcome: Progressing   

## 2019-09-13 NOTE — Progress Notes (Signed)
Occupational Therapy Evaluation Patient Details Name: Alexandria Harvey MRN: [id number] DOB: 1980/10/31 Today's Date: 09/13/2019    History of Present Illness Alexandria Harvey is an 39 y.o. female past medical history significant for schizoaffective disorder, intellectual disability, seizures,  diabetes mellitus type 2 and essential hypertension who presents with significant decrease in level of consciousness that started a few days prior to admission.  As per facility the patient has not been conversing much for the last several days in the ED she was found to be hypoxic chest x-ray showed bilateral infiltrates and SARS-CoV-2 PCR was positive   Clinical Impression   Spoke with Alexandria Harvey at the group home as unable to reach her legal guardian Alexandria Harvey. Alexandria Harvey reports that Alexandria Harvey was walking independently and completing her bathing and dressing and was able to feed herself. Staff supervised on stairs due to recent falls.  Alexandria Harvey reports that Alexandria Harvey has to be ambulatory in order to return to the group home. Pt required Max encouragement to participate. Mobilized to EOB with Max A. Unabl eot stand as pt was pushing herself back onto the bed. Attemptd bed level ADL however pt declined. Left in chair position in bed - discussed with nsg and recommend chair position in bed as much as possible. Will follow acutely.     Follow Up Recommendations  SNF;Supervision/Assistance - 24 hour;Other (comment)(vs return to group home)    Equipment Recommendations  3 in 1 bedside commode    Recommendations for Other Services       Precautions / Restrictions Precautions Precautions: Fall Precaution Comments: has had multiple falls, being followed by neurology for Parkinson's like gait, also had a LP back in 9/20 for r/o NPH.      Mobility Bed Mobility Overal bed mobility: Needs Assistance   Rolling: Mod assist Sidelying to sit: Max assist   Sit to supine: Mod assist   General bed mobility comments: Pt able to  lift both legs back onto bed  Transfers Overall transfer level: Needs assistance               General transfer comment: unable as pt pushing self back onto bed    Balance   Sitting-balance support: Feet supported Sitting balance-Leahy Scale: Poor                                     ADL either performed or assessed with clinical judgement   ADL Overall ADL's : Needs assistance/impaired Eating/Feeding: Minimal assistance   Grooming: Moderate assistance;Sitting   Upper Body Bathing: Moderate assistance;Sitting   Lower Body Bathing: Maximal assistance;Bed level   Upper Body Dressing : Maximal assistance;Sitting   Lower Body Dressing: Maximal assistance;Bed level       Toileting- Clothing Manipulation and Hygiene: Maximal assistance       Functional mobility during ADLs: Maximal assistance General ADL Comments: Pt declining "I don't want to" when asked to get OOB to chair; Max A to mobilize to EOB; pt pushing self posteirorly back into bed     Vision   Additional Comments: unsure     Perception     Praxis      Pertinent Vitals/Pain Faces Pain Scale: Hurts little more Pain Location: generaldiscomfort Pain Descriptors / Indicators: Crying;Discomfort Pain Intervention(s): Limited activity within patient's tolerance     Hand Dominance Right   Extremity/Trunk Assessment Upper Extremity Assessment Upper Extremity Assessment: Generalized weakness;RUE deficits/detail;LUE deficits/detail RUE Deficits / Details: holding  in fisted position at chest but able to use functionally; generalized weakness LUE Deficits / Details: holding fisted but using spontaneously during mobility; using R more than L LUE Coordination: decreased fine motor   Lower Extremity Assessment Lower Extremity Assessment: Defer to PT evaluation   Cervical / Trunk Assessment Cervical / Trunk Assessment: Other exceptions(posterior push)   Communication  Communication Communication: No difficulties   Cognition Arousal/Alertness: Awake/alert Behavior During Therapy: Flat affect Overall Cognitive Status: No family/caregiver present to determine baseline cognitive functioning Area of Impairment: Orientation;Attention;Following commands;Safety/judgement;Awareness;Problem solving                 Orientation Level: Disoriented to;Time;Situation;Place Current Attention Level: Sustained   Following Commands: Follows one step commands with increased time Safety/Judgement: Decreased awareness of safety;Decreased awareness of deficits Awareness: Intellectual Problem Solving: Slow processing;Decreased initiation;Difficulty sequencing     General Comments       Exercises     Shoulder Instructions      Home Living Family/patient expects to be discharged to:: Group home                                        Prior Functioning/Environment Level of Independence: Needs assistance  Gait / Transfers Assistance Needed: independent ;staffsupervised on stairs ADL's / Homemaking Assistance Needed: S for ADL            OT Problem List: Decreased strength;Decreased activity tolerance;Impaired balance (sitting and/or standing);Decreased coordination;Decreased cognition;Decreased safety awareness;Decreased knowledge of use of DME or AE;Cardiopulmonary status limiting activity;Obesity;Pain      OT Treatment/Interventions: Self-care/ADL training;Therapeutic exercise;Neuromuscular education;Energy conservation;DME and/or AE instruction;Therapeutic activities;Cognitive remediation/compensation;Patient/family education;Balance training    OT Goals(Current goals can be found in the care plan section) Acute Rehab OT Goals Patient Stated Goal: to stay in the bed OT Goal Formulation: Patient unable to participate in goal setting Time For Goal Achievement: 09/27/19 Potential to Achieve Goals: Good  OT Frequency: Min 2X/week    Barriers to D/C:            Co-evaluation              AM-PAC OT "6 Clicks" Daily Activity     Outcome Measure Help from another person eating meals?: A Little Help from another person taking care of personal grooming?: A Lot Help from another person toileting, which includes using toliet, bedpan, or urinal?: A Lot Help from another person bathing (including washing, rinsing, drying)?: A Lot Help from another person to put on and taking off regular upper body clothing?: A Lot Help from another person to put on and taking off regular lower body clothing?: A Lot 6 Click Score: 13   End of Session Nurse Communication: Mobility status;Other (comment)(encourage chair position throughtout the day )  Activity Tolerance: Patient limited by fatigue Patient left: in bed;with call bell/phone within reach;with bed alarm set;Other (comment)(chair position)  OT Visit Diagnosis: Unsteadiness on feet (R26.81);Other abnormalities of gait and mobility (R26.89);Repeated falls (R29.6);Muscle weakness (generalized) (M62.81);Cognitive communication deficit (R41.841);Pain Pain - part of body: (generalized)                Time: 0165-5374 OT Time Calculation (min): 29 min Charges:  OT General Charges $OT Visit: 1 Visit OT Evaluation $OT Eval Moderate Complexity: 1 Mod OT Treatments $Self Care/Home Management : 8-22 mins  Luisa Dago, OT/L   Acute OT Clinical Specialist Acute Rehabilitation Services Pager [phone] Office [phone]  [place] 09/13/2019, 10:40 AM

## 2019-09-13 NOTE — Progress Notes (Signed)
No noted distress. Denies pain. Full bath, unable to reach legal guardian. Pt slept well.

## 2019-09-13 NOTE — Plan of Care (Signed)
Patient Care Plan  Problem: Education: Goal: Knowledge of risk factors and measures for prevention of condition will improve Outcome: Progressing   Problem: Coping: Goal: Psychosocial and spiritual needs will be supported Outcome: Progressing   Problem: Respiratory: Goal: Will maintain a patent airway Outcome: Progressing Goal: Complications related to the disease process, condition or treatment will be avoided or minimized Outcome: Progressing   

## 2019-09-13 NOTE — Progress Notes (Signed)
Spoke with patients legal guardian, Benard Halsted, and gave an update on patients status. Reports that patient is more independent than she lets on. Ensured her that we would encourage patient to be as independent as possible with ADL's. She would like for patient to return to group home she currently resides in.

## 2019-09-14 LAB — COMPREHENSIVE METABOLIC PANEL
ALT: 14 U/L (ref 0–44)
AST: 33 U/L (ref 15–41)
Albumin: 2 g/dL — ABNORMAL LOW (ref 3.5–5.0)
Alkaline Phosphatase: 51 U/L (ref 38–126)
Anion gap: 12 (ref 5–15)
BUN: 12 mg/dL (ref 6–20)
CO2: 25 mmol/L (ref 22–32)
Calcium: 8.2 mg/dL — ABNORMAL LOW (ref 8.9–10.3)
Chloride: 102 mmol/L (ref 98–111)
Creatinine, Ser: 0.35 mg/dL — ABNORMAL LOW (ref 0.44–1.00)
GFR calc Af Amer: >60 mL/min (ref >60)
GFR calc non Af Amer: >60 mL/min (ref >60)
Glucose, Bld: 114 mg/dL — ABNORMAL HIGH (ref 70–99)
Potassium: 4 mmol/L (ref 3.5–5.1)
Sodium: 139 mmol/L (ref 135–145)
Total Bilirubin: 0.6 mg/dL (ref 0.3–1.2)
Total Protein: 5.4 g/dL — ABNORMAL LOW (ref 6.5–8.1)

## 2019-09-14 LAB — CBC WITH DIFFERENTIAL/PLATELET
Abs Immature Granulocytes: 1 10*3/uL — ABNORMAL HIGH (ref 0.00–0.07)
Band Neutrophils: 4 %
Basophils Absolute: 0 10*3/uL (ref 0.0–0.1)
Basophils Relative: 0 %
Eosinophils Absolute: 0 10*3/uL (ref 0.0–0.5)
Eosinophils Relative: 0 %
HCT: 31.3 % — ABNORMAL LOW (ref 36.0–46.0)
Hemoglobin: 10 g/dL — ABNORMAL LOW (ref 12.0–15.0)
Lymphocytes Relative: 22 %
Lymphs Abs: 3.2 10*3/uL (ref 0.7–4.0)
MCH: 28.7 pg (ref 26.0–34.0)
MCHC: 31.9 g/dL (ref 30.0–36.0)
MCV: 89.9 fL (ref 80.0–100.0)
Monocytes Absolute: 0.6 10*3/uL (ref 0.1–1.0)
Monocytes Relative: 4 %
Myelocytes: 7 %
Neutro Abs: 9.6 10*3/uL — ABNORMAL HIGH (ref 1.7–7.7)
Neutrophils Relative %: 63 %
Platelets: 303 10*3/uL (ref 150–400)
RBC: 3.48 MIL/uL — ABNORMAL LOW (ref 3.87–5.11)
RDW: 17.3 % — ABNORMAL HIGH (ref 11.5–15.5)
WBC: 14.4 10*3/uL — ABNORMAL HIGH (ref 4.0–10.5)
nRBC: 0.3 % — ABNORMAL HIGH (ref 0.0–0.2)

## 2019-09-14 LAB — GLUCOSE, CAPILLARY
Glucose-Capillary: 77 mg/dL (ref 70–99)
Glucose-Capillary: 159 mg/dL — ABNORMAL HIGH (ref 70–99)
Glucose-Capillary: 131 mg/dL — ABNORMAL HIGH (ref 70–99)

## 2019-09-14 LAB — D-DIMER, QUANTITATIVE: D-Dimer, Quant: 1.48 ug/mL-FEU — ABNORMAL HIGH (ref 0.00–0.50)

## 2019-09-14 NOTE — Plan of Care (Signed)
  Problem: Respiratory: Goal: Will maintain a patent airway Outcome: Progressing Goal: Complications related to the disease process, condition or treatment will be avoided or minimized Outcome: Progressing   

## 2019-09-14 NOTE — Progress Notes (Signed)
[facility] PROGRESS NOTE    Progress Note  Alexandria Harvey  IFO:[id number] DOB: August 11, 1981 DOA: 09/08/2019 PCP: Lesia Sago, MD     Brief Narrative:   Alexandria Harvey is an 39 y.o. female past medical history significant for schizoaffective disorder, intellectual disability diabetes mellitus type 2 and essential hypertension who presents with significant decrease in level of consciousness that started a few days prior to admission.  As per facility the patient has not been conversing much for the last several days in the ED she was found to be hypoxic chest x-ray showed bilateral infiltrates and SARS-CoV-2 PCR was positive.  Assessment/Plan:   Acute respiratory failure with hypoxia secondarily to COVID-19 pneumonia/sepsis due to aspiration pneumonia:  Recent Labs  Lab 09/08/19 2110 09/08/19 2142 09/10/19 0417 09/11/19 0455 09/12/19 0520 09/13/19 0220 09/13/19 1205 09/14/19 0320  DDIMER 1.82*  --  1.24* 1.08* 1.02*  --  1.48* 1.48*  FERRITIN 558*  --  515*  --   --   --   --   --   CRP 5.5*  --  8.4* 3.4* 1.4*  --  2.2*  --   ALT  --   --  15 23 30 19   --  14  PROCALCITON  --  0.15 0.14 <0.10 <0.10  --   --   --     Patient was started on remdesivir and steroids.  Completed course of remdesivir.  No longer on steroids.  Patient is stable from a respiratory standpoint.  She is saturating normal on room air.  Inflammatory markers improved.  D-dimer 1.48.  WBC is improving.    There was concern for aspiration as well and so the patient was started on Unasyn.  Change over to Augmentin.  Seen by speech therapy.  Sepsis physiology resolved.  Continue with incentive spirometry mobilization as much as possible.    Acute Metabolic encephalopathy: Likely due to COVID-19.  CT head did not show any acute findings.  Now seems to be back to her baseline.    History of schizoaffective disorder/intellectual disability Patient lives in a group home.  Seems to be close to her  baseline.  Has a legal guardian.  Would prefer that she be able to return to her group home when stable.  Uncontrolled diabetes mellitus type 2 with hyperglycemia: HbA1c 6.3.  Patient on Metformin at home which was held at admission.  Continue to monitor CBGs.  Elevated LFTs: Likely due to infectious etiology.  Improving gradually.  Thrombocytopenia: Likely due to infectious etiology, now resolved.  Essential hypertension: Blood pressure is reasonably well controlled.  Continue to monitor.  Holding her antihypertensives.  Anion gap metabolic acidosis due to lactic acidosis: Resolved  Hyperkalemia: She was given oral Kayexalate. Now resolved.  Normocytic anemia: Stable.  No evidence of overt blood loss.  Sacral decubitus ulcer stage II present on admission: RN Pressure Injury Documentation: Pressure Injury 09/09/19 Sacrum Left;Right Stage 2 -  Partial thickness loss of dermis presenting as a shallow open injury with a red, pink wound bed without slough. (Active)  09/09/19 2300  Location: Sacrum  Location Orientation: Left;Right  Staging: Stage 2 -  Partial thickness loss of dermis presenting as a shallow open injury with a red, pink wound bed without slough.  Wound Description (Comments):   Present on Admission: Yes    DVT prophylaxis: lovenox CODE STATUS: Full code Family Communication: Legal guardian was updated yesterday. Disposition Plan: Ideally she should be able to return to her group home.  Apparently she was able to ambulate without any difficulty while she was at the group home.  Has not done that yet here.  Will need to find out from the group home as to how much support they can provide.    IV Access:    Peripheral IV   Procedures and diagnostic studies:   No results found.   Medical Consultants:    None.  Anti-Infectives:   IV remdesivir and Unasyn  Subjective:    Difficult to communicate with her due to her intellectual disabilities.  Does  not appear to be in any discomfort.  Objective:    Vitals:   09/13/19 1540 09/13/19 2033 09/14/19 0454 09/14/19 0844  BP: 120/74 123/71 129/85 135/80  Pulse: 91 71 88 99  Resp: 16 18 18 16   Temp: 98.7 F (37.1 C) 98.2 F (36.8 C) 97.9 F (36.6 C) 97.8 F (36.6 C)  TempSrc: Oral Oral Oral Oral  SpO2: 96% 99% 97% 97%  Weight:      Height:       SpO2: 97 % O2 Flow Rate (L/min): 0 L/min   Intake/Output Summary (Last 24 hours) at 09/14/2019 1503 Last data filed at 09/14/2019 0837 Gross per 24 hour  Intake 103 ml  Output 500 ml  Net -397 ml   Filed Weights   09/09/19 2307 09/11/19 0427 09/13/19 0500  Weight: 68.5 kg 69.6 kg 67.7 kg     General appearance: Wake alert.  Not very communicative. Resp: Normal effort at rest.  Few crackles at the bases.  No wheezing or rhonchi. Cardio: S1-S2 is normal regular.  No S3-S4.  No rubs murmurs or bruit GI: Abdomen is soft.  Nontender nondistended.  Bowel sounds are present normal.  No masses organomegaly Extremities: No edema.  Moving all her extremities Neurologic: No obvious focal deficits   Data Reviewed:    Labs: Basic Metabolic Panel: Recent Labs  Lab 09/10/19 0417 09/11/19 0455 09/12/19 0520 09/13/19 0220 09/14/19 0320  NA 139 140 141 141 139  K 5.0 4.5 4.4 4.3 4.0  CL 108 104 103 102 102  CO2 22 23 26 27 25   GLUCOSE 90 158* 142* 111* 114*  BUN 13 9 12 14 12   CREATININE 0.38* 0.45 0.48 0.42* 0.35*  CALCIUM 8.1* 8.2* 8.2* 8.3* 8.2*  MG 2.0  --   --   --   --   PHOS 2.4*  --   --   --   --    GFR Estimated Creatinine Clearance: 86.8 mL/min (A) (by C-G formula based on SCr of 0.35 mg/dL (L)). Liver Function Tests: Recent Labs  Lab 09/10/19 0417 09/11/19 0455 09/12/19 0520 09/13/19 0220 09/14/19 0320  AST 111* 83* 76* 54* 33  ALT 15 23 30 19 14   ALKPHOS 37* 40 48 48 51  BILITOT 0.7 0.6 0.3 0.8 0.6  PROT 5.1* 5.0* 5.5* 5.7* 5.4*  ALBUMIN 1.9* 1.8* 2.0* 2.2* 2.0*    CBC: Recent Labs  Lab  09/10/19 0417 09/11/19 0455 09/12/19 0520 09/13/19 1205 09/14/19 0320  WBC 9.4 9.3 10.9* 16.4* 14.4*  NEUTROABS 6.6 6.0 8.5* 10.4* 9.6*  HGB 10.0* 9.8* 10.3* 11.5* 10.0*  HCT 31.4* 30.5* 32.2* 35.6* 31.3*  MCV 91.0 91.0 89.2 89.9 89.9  PLT 128* 168 236 279 303   CBG: Recent Labs  Lab 09/13/19 0756 09/13/19 1208 09/13/19 1614 09/14/19 0804 09/14/19 1206  GLUCAP 98 105* 161* 77 159*   D-Dimer: Recent Labs    09/13/19 1205 09/14/19 0320  DDIMER 1.48* 1.48*   Sepsis Labs: Recent Labs  Lab 09/08/19 2111 09/08/19 2142 09/09/19 0335 09/09/19 0726 09/09/19 1504 09/09/19 1557 09/10/19 0417 09/11/19 0455 09/12/19 0520 09/13/19 1205 09/14/19 0320  PROCALCITON  --  0.15  --   --   --   --  0.14 <0.10 <0.10  --   --   WBC   < >  --   --   --   --   --  9.4 9.3 10.9* 16.4* 14.4*  LATICACIDVEN  --   --  4.5* 3.5* 2.1* 2.5*  --   --   --   --   --    < > = values in this interval not displayed.   Microbiology Recent Results (from the past 240 hour(s))  Blood Culture (routine x 2)     Status: None   Collection Time: 09/08/19  7:41 PM   Specimen: BLOOD  Result Value Ref Range Status   Specimen Description BLOOD RIGHT ANTECUBITAL  Final   Special Requests   Final    BOTTLES DRAWN AEROBIC AND ANAEROBIC Blood Culture adequate volume   Culture   Final    NO GROWTH 5 DAYS Performed at [facility], [address], [place], [place] [address]    Report Status 09/13/2019 FINAL  Final  Blood Culture (routine x 2)     Status: None   Collection Time: 09/08/19  7:58 PM   Specimen: BLOOD  Result Value Ref Range Status   Specimen Description BLOOD LEFT ANTECUBITAL  Final   Special Requests   Final    BOTTLES DRAWN AEROBIC AND ANAEROBIC Blood Culture adequate volume   Culture   Final    NO GROWTH 5 DAYS Performed at [facility], [place] [address], [place], [place] [address]    Report Status 09/13/2019 FINAL  Final  MRSA PCR Screening     Status: None   Collection  Time: 09/09/19 12:30 AM   Specimen: Nasal Mucosa; Nasopharyngeal  Result Value Ref Range Status   MRSA by PCR NEGATIVE NEGATIVE Final    Comment:        The GeneXpert MRSA Assay (FDA approved for NASAL specimens only), is one component of a comprehensive MRSA colonization surveillance program. It is not intended to diagnose MRSA infection nor to guide or monitor treatment for MRSA infections. Performed at [facility], [place] [address], [place], [place] [address]      Medications:   . acetaminophen  650 mg Rectal Once  . amoxicillin-clavulanate  1 tablet Oral Q12H  . benztropine  1 mg Oral BID  . carbidopa-levodopa  1 tablet Oral TID  . cloZAPine  25 mg Oral BID  . divalproex  1,000 mg Oral BID  . enoxaparin (LOVENOX) injection  40 mg Subcutaneous Q24H  . ferrous sulfate  325 mg Oral TID WC  . lubiprostone  24 mcg Oral Q breakfast  . pantoprazole  40 mg Oral Daily  . saccharomyces boulardii  250 mg Oral BID  . sodium chloride flush  3 mL Intravenous Q12H  . Vitamin D (Ergocalciferol)  50,000 Units Oral Once per day on Tue Fri   Continuous Infusions:    LOS: 6 days   [employer]  [facility]  09/14/2019, 3:03 PM

## 2019-09-15 LAB — GLUCOSE, CAPILLARY
Glucose-Capillary: 80 mg/dL (ref 70–99)
Glucose-Capillary: 183 mg/dL — ABNORMAL HIGH (ref 70–99)
Glucose-Capillary: 192 mg/dL — ABNORMAL HIGH (ref 70–99)
Glucose-Capillary: 199 mg/dL — ABNORMAL HIGH (ref 70–99)

## 2019-09-15 MED ORDER — FERROUS SULFATE 325 (65 FE) MG PO TABS
325.0000 mg | ORAL_TABLET | Freq: Every day | ORAL | Status: DC
  Administered 2019-09-16: 08:00:00 325 mg via ORAL
  Filled 2019-09-15 (×2): qty 1

## 2019-09-15 MED ORDER — SACCHAROMYCES BOULARDII 250 MG PO CAPS: 250.0000 mg | ORAL_CAPSULE | Freq: Two times a day (BID) | ORAL | 0 refills | Status: AC

## 2019-09-15 MED ORDER — AMOXICILLIN-POT CLAVULANATE 875-125 MG PO TABS: 1.0000 | ORAL_TABLET | Freq: Two times a day (BID) | ORAL | 0 refills | Status: AC

## 2019-09-15 MED ORDER — FERROUS SULFATE 325 (65 FE) MG PO TABS: 325.0000 mg | ORAL_TABLET | Freq: Every day | ORAL | 0 refills | Status: DC

## 2019-09-15 NOTE — Progress Notes (Signed)
Occupational Therapy Treatment Patient Details Name: Alexandria Harvey MRN: [id number] DOB: [birth year] Today's Date: 09/15/2019    History of present illness Alexandria Harvey is an 39 y.o. female past medical history significant for schizoaffective disorder, intellectual disability, seizures,  diabetes mellitus type 2 and essential hypertension who presents with significant decrease in level of consciousness that started a few days prior to admission.  As per facility the patient has not been conversing much for the last several days in the ED she was found to be hypoxic chest x-ray showed bilateral infiltrates and SARS-CoV-2 PCR was positive   OT comments  Patient was lethargic on arrival, but agreeable to therapy.  She was highly motivated by ice cream and sprite. Completed grooming in bed with set up and min verbal cueing.  Patient needed mod assist to EOB and min assist to stand.  Walked to bathroom with min assist and hand held.  Required min assist for balance and verbal cues for sequencing with LE bathing with washcloth at sink.  Walked well with RW and min guard.  Patient was able to transfer to chair and scoot back with verbal cues. She drank from a cup with set up and ate ice cream with set up.  She is able to discharge to group home with [place] recommended.     Follow Up Recommendations  Home health OT;Other (comment)(Return to group home)    Equipment Recommendations  3 in 1 bedside commode;Other (comment)(RW)    Recommendations for Other Services      Precautions / Restrictions Precautions Precautions: Fall Precaution Comments: has had multiple falls, being followed by neurology for Parkinson's like gait, also had a LP back in 9/20 for r/o NPH. Restrictions Weight Bearing Restrictions: No       Mobility Bed Mobility Overal bed mobility: Needs Assistance Bed Mobility: Rolling;Sidelying to Sit Rolling: Mod assist     Sit to supine: Mod assist    Transfers Overall transfer  level: Needs assistance Equipment used: Rolling walker (2 wheeled) Transfers: Sit to/from Stand Sit to Stand: Min assist         General transfer comment: from bed, steady assist    Balance Overall balance assessment: History of Falls;Needs assistance Sitting-balance support: Feet supported       Standing balance support: Bilateral upper extremity supported                               ADL either performed or assessed with clinical judgement   ADL Overall ADL's : Needs assistance/impaired Eating/Feeding: Set up   Grooming: Set up;Bed level;Cueing for safety;Cueing for sequencing Grooming Details (indicate cue type and reason): Required set up and min assist while standing at sink     Lower Body Bathing: Minimal assistance;Cueing for safety;Cueing for sequencing;Sit to/from stand Lower Body Bathing Details (indicate cue type and reason): standing at sink using washcloth Upper Body Dressing : Mod assistance;Sitting   Lower Body Dressing: Mod assistance;Bed level   Toilet Transfer: Minimal assistance;Cueing for safety;RW   Toileting- Clothing Manipulation and Hygiene: Moderate assistance;Cueing for safety;Cueing for sequencing               Vision       Perception     Praxis      Cognition Arousal/Alertness: Awake/alert Behavior During Therapy: Flat affect Overall Cognitive Status: History of cognitive impairments - at baseline(presumed) Area of Impairment: Orientation  Orientation Level: Time;Situation Current Attention Level: Sustained   Following Commands: Follows one step commands with increased time Safety/Judgement: Decreased awareness of safety;Decreased awareness of deficits Awareness: Intellectual Problem Solving: Decreased initiation;Difficulty sequencing;Requires verbal cues          Exercises     Shoulder Instructions       General Comments      Pertinent Vitals/ Pain       Pain Assessment:  No/denies pain Faces Pain Scale: Hurts a little bit Pain Location: generaldiscomfort  Home Living                                          Prior Functioning/Environment              Frequency  Min 2X/week        Progress Toward Goals  OT Goals(current goals can now be found in the care plan section)  Progress towards OT goals: Progressing toward goals  Acute Rehab OT Goals Patient Stated Goal: Eat ice cream/drink sprite OT Goal Formulation: Patient unable to participate in goal setting Time For Goal Achievement: 09/27/19 Potential to Achieve Goals: Good ADL Goals Pt Will Perform Eating: with set-up;sitting Pt Will Perform Grooming: with set-up;sitting;with supervision Pt Will Perform Upper Body Bathing: with set-up;sitting;with supervision Pt Will Transfer to Toilet: with min assist;regular height toilet  Plan Discharge plan needs to be updated    Co-evaluation                 AM-PAC OT "6 Clicks" Daily Activity     Outcome Measure   Help from another person eating meals?: A Little Help from another person taking care of personal grooming?: A Lot Help from another person toileting, which includes using toliet, bedpan, or urinal?: A Little Help from another person bathing (including washing, rinsing, drying)?: A Lot Help from another person to put on and taking off regular upper body clothing?: A Lot Help from another person to put on and taking off regular lower body clothing?: A Lot 6 Click Score: 14    End of Session Equipment Utilized During Treatment: Gait belt;Rolling walker  OT Visit Diagnosis: Unsteadiness on feet (R26.81);Other abnormalities of gait and mobility (R26.89);Repeated falls (R29.6);Muscle weakness (generalized) (M62.81);Cognitive communication deficit (R41.841);Pain   Activity Tolerance Patient limited by lethargy;Patient limited by fatigue   Patient Left with call bell/phone within reach;Other (comment);in  chair;with chair alarm set   Nurse Communication Mobility status        Time: [phone] OT Time Calculation (min): 38 min  Charges: OT Treatments $Self Care/Home Management : 23-37 mins  Barbie Banner, OTR/L   Adella Hare 09/15/2019, 11:05 AM

## 2019-09-15 NOTE — Plan of Care (Signed)
Rested well throughout night.  Good output to purewick  Problem: Coping: Goal: Psychosocial and spiritual needs will be supported Outcome: Progressing   Problem: Respiratory: Goal: Will maintain a patent airway Outcome: Progressing Goal: Complications related to the disease process, condition or treatment will be avoided or minimized Outcome: Progressing

## 2019-09-15 NOTE — Discharge Instructions (Signed)
COVID-19 COVID-19 is a respiratory infection that is caused by a virus called severe acute respiratory syndrome coronavirus 2 (SARS-CoV-2). The disease is also known as coronavirus disease or novel coronavirus. In some people, the virus may not cause any symptoms. In others, it may cause a serious infection. The infection can get worse quickly and can lead to complications, such as:  Pneumonia, or infection of the lungs.  Acute respiratory distress syndrome or ARDS. This is a condition in which fluid build-up in the lungs prevents the lungs from filling with air and passing oxygen into the blood.  Acute respiratory failure. This is a condition in which there is not enough oxygen passing from the lungs to the body or when carbon dioxide is not passing from the lungs out of the body.  Sepsis or septic shock. This is a serious bodily reaction to an infection.  Blood clotting problems.  Secondary infections due to bacteria or fungus.  Organ failure. This is when your body's organs stop working. The virus that causes COVID-19 is contagious. This means that it can spread from person to person through droplets from coughs and sneezes (respiratory secretions). What are the causes? This illness is caused by a virus. You may catch the virus by:  Breathing in droplets from an infected person. Droplets can be spread by a person breathing, speaking, singing, coughing, or sneezing.  Touching something, like a table or a doorknob, that was exposed to the virus (contaminated) and then touching your mouth, nose, or eyes. What increases the risk? Risk for infection You are more likely to be infected with this virus if you:  Are within 6 feet (2 meters) of a person with COVID-19.  Provide care for or live with a person who is infected with COVID-19.  Spend time in crowded indoor spaces or live in shared housing. Risk for serious illness You are more likely to become seriously ill from the virus if you:   Are 50 years of age or older. The higher your age, the more you are at risk for serious illness.  Live in a nursing home or long-term care facility.  Have cancer.  Have a long-term (chronic) disease such as: ? Chronic lung disease, including chronic obstructive pulmonary disease or asthma. ? A long-term disease that lowers your body's ability to fight infection (immunocompromised). ? Heart disease, including heart failure, a condition in which the arteries that lead to the heart become narrow or blocked (coronary artery disease), a disease which makes the heart muscle thick, weak, or stiff (cardiomyopathy). ? Diabetes. ? Chronic kidney disease. ? Sickle cell disease, a condition in which red blood cells have an abnormal "sickle" shape. ? Liver disease.  Are obese. What are the signs or symptoms? Symptoms of this condition can range from mild to severe. Symptoms may appear any time from 2 to 14 days after being exposed to the virus. They include:  A fever or chills.  A cough.  Difficulty breathing.  Headaches, body aches, or muscle aches.  Runny or stuffy (congested) nose.  A sore throat.  New loss of taste or smell. Some people may also have stomach problems, such as nausea, vomiting, or diarrhea. Other people may not have any symptoms of COVID-19. How is this diagnosed? This condition may be diagnosed based on:  Your signs and symptoms, especially if: ? You live in an area with a COVID-19 outbreak. ? You recently traveled to or from an area where the virus is common. ? You   provide care for or live with a person who was diagnosed with COVID-19. ? You were exposed to a person who was diagnosed with COVID-19.  A physical exam.  Lab tests, which may include: ? Taking a sample of fluid from the back of your nose and throat (nasopharyngeal fluid), your nose, or your throat using a swab. ? A sample of mucus from your lungs (sputum). ? Blood tests.  Imaging tests, which  may include, X-rays, CT scan, or ultrasound. How is this treated? At present, there is no medicine to treat COVID-19. Medicines that treat other diseases are being used on a trial basis to see if they are effective against COVID-19. Your health care provider will talk with you about ways to treat your symptoms. For most people, the infection is mild and can be managed at home with rest, fluids, and over-the-counter medicines. Treatment for a serious infection usually takes places in a hospital intensive care unit (ICU). It may include one or more of the following treatments. These treatments are given until your symptoms improve.  Receiving fluids and medicines through an IV.  Supplemental oxygen. Extra oxygen is given through a tube in the nose, a face mask, or a hood.  Positioning you to lie on your stomach (prone position). This makes it easier for oxygen to get into the lungs.  Continuous positive airway pressure (CPAP) or bi-level positive airway pressure (BPAP) machine. This treatment uses mild air pressure to keep the airways open. A tube that is connected to a motor delivers oxygen to the body.  Ventilator. This treatment moves air into and out of the lungs by using a tube that is placed in your windpipe.  Tracheostomy. This is a procedure to create a hole in the neck so that a breathing tube can be inserted.  Extracorporeal membrane oxygenation (ECMO). This procedure gives the lungs a chance to recover by taking over the functions of the heart and lungs. It supplies oxygen to the body and removes carbon dioxide. Follow these instructions at home: Lifestyle  If you are sick, stay home except to get medical care. Your health care provider will tell you how long to stay home. Call your health care provider before you go for medical care.  Rest at home as told by your health care provider.  Do not use any products that contain nicotine or tobacco, such as cigarettes, e-cigarettes, and  chewing tobacco. If you need help quitting, ask your health care provider.  Return to your normal activities as told by your health care provider. Ask your health care provider what activities are safe for you. General instructions  Take over-the-counter and prescription medicines only as told by your health care provider.  Drink enough fluid to keep your urine pale yellow.  Keep all follow-up visits as told by your health care provider. This is important. How is this prevented?  There is no vaccine to help prevent COVID-19 infection. However, there are steps you can take to protect yourself and others from this virus. To protect yourself:   Do not travel to areas where COVID-19 is a risk. The areas where COVID-19 is reported change often. To identify high-risk areas and travel restrictions, check the CDC travel website: wwwnc.cdc.gov/travel/notices  If you live in, or must travel to, an area where COVID-19 is a risk, take precautions to avoid infection. ? Stay away from people who are sick. ? Wash your hands often with soap and water for 20 seconds. If soap and water   are not available, use an alcohol-based hand sanitizer. ? Avoid touching your mouth, face, eyes, or nose. ? Avoid going out in public, follow guidance from your state and local health authorities. ? If you must go out in public, wear a cloth face covering or face mask. Make sure your mask covers your nose and mouth. ? Avoid crowded indoor spaces. Stay at least 6 feet (2 meters) away from others. ? Disinfect objects and surfaces that are frequently touched every day. This may include:  Counters and tables.  Doorknobs and light switches.  Sinks and faucets.  Electronics, such as phones, remote controls, keyboards, computers, and tablets. To protect others: If you have symptoms of COVID-19, take steps to prevent the virus from spreading to others.  If you think you have a COVID-19 infection, contact your health care  provider right away. Tell your health care team that you think you may have a COVID-19 infection.  Stay home. Leave your house only to seek medical care. Do not use public transport.  Do not travel while you are sick.  Wash your hands often with soap and water for 20 seconds. If soap and water are not available, use alcohol-based hand sanitizer.  Stay away from other members of your household. Let healthy household members care for children and pets, if possible. If you have to care for children or pets, wash your hands often and wear a mask. If possible, stay in your own room, separate from others. Use a different bathroom.  Make sure that all people in your household wash their hands well and often.  Cough or sneeze into a tissue or your sleeve or elbow. Do not cough or sneeze into your hand or into the air.  Wear a cloth face covering or face mask. Make sure your mask covers your nose and mouth. Where to find more information  Centers for Disease Control and Prevention: www.cdc.gov/coronavirus/2019-ncov/index.html  World Health Organization: www.who.int/health-topics/coronavirus Contact a health care provider if:  You live in or have traveled to an area where COVID-19 is a risk and you have symptoms of the infection.  You have had contact with someone who has COVID-19 and you have symptoms of the infection. Get help right away if:  You have trouble breathing.  You have pain or pressure in your chest.  You have confusion.  You have bluish lips and fingernails.  You have difficulty waking from sleep.  You have symptoms that get worse. These symptoms may represent a serious problem that is an emergency. Do not wait to see if the symptoms will go away. Get medical help right away. Call your local emergency services (911 in the [place] Do not drive yourself to the hospital. Let the emergency medical personnel know if you think you have COVID-19. Summary  COVID-19 is a  respiratory infection that is caused by a virus. It is also known as coronavirus disease or novel coronavirus. It can cause serious infections, such as pneumonia, acute respiratory distress syndrome, acute respiratory failure, or sepsis.  The virus that causes COVID-19 is contagious. This means that it can spread from person to person through droplets from breathing, speaking, singing, coughing, or sneezing.  You are more likely to develop a serious illness if you are 50 years of age or older, have a weak immune system, live in a nursing home, or have chronic disease.  There is no medicine to treat COVID-19. Your health care provider will talk with you about ways to treat your symptoms.    Take steps to protect yourself and others from infection. Wash your hands often and disinfect objects and surfaces that are frequently touched every day. Stay away from people who are sick and wear a mask if you are sick. This information is not intended to replace advice given to you by your health care provider. Make sure you discuss any questions you have with your health care provider. Document Revised: 06/23/2019 Document Reviewed: 09/29/2018 Elsevier Patient Education  2020 [employer]  

## 2019-09-15 NOTE — Progress Notes (Signed)
Physical Therapy Treatment Patient Details Name: Alexandria Harvey MRN: [id number] DOB: 1981-01-26 Today's Date: 09/15/2019    History of Present Illness Alexandria Harvey is an 39 y.o. female past medical history significant for schizoaffective disorder, intellectual disability, seizures,  diabetes mellitus type 2 and essential hypertension who presents with significant decrease in level of consciousness that started a few days prior to admission.  As per facility the patient has not been conversing much for the last several days in the ED she was found to be hypoxic chest x-ray showed bilateral infiltrates and SARS-CoV-2 PCR was positive    PT Comments    The patient is progressing, ambulated with 1 assisting with HHA and with  RW. Patient will benfit from returning to familiar [place] with HHPT.   Follow Up Recommendations  Home health PT     Equipment Recommendations  (TBD)    Recommendations for Other Services       Precautions / Restrictions Precautions Precautions: Fall Precaution Comments: has had multiple falls, being followed by neurology for Parkinson's like gait, also had a LP back in 9/20 for r/o NPH. Restrictions Weight Bearing Restrictions: No    Mobility  Bed Mobility Overal bed mobility: Needs Assistance Bed Mobility: Rolling;Sidelying to Sit Rolling: Mod assist     Sit to supine: Mod assist   General bed mobility comments: multimodal cues to initiate moving legs, reaches out to pull up, encouraged to perform without HHA.  Transfers Overall transfer level: Needs assistance Equipment used: Rolling walker (2 wheeled) Transfers: Sit to/from Stand Sit to Stand: Min assist         General transfer comment: from bed, steady assist  Ambulation/Gait Ambulation/Gait assistance: Mod assist Gait Distance (Feet): 40 Feet Assistive device: 1 person hand held assist Gait Pattern/deviations: Step-to pattern;Step-through pattern;Drifts right/left Gait velocity:  decr   General Gait Details: initially mod steady assist, then improved to min steady assist, reaches for objects,ambulated with ! HHA then with RW, steady assist with each.   Stairs             Wheelchair Mobility    Modified Rankin (Stroke Patients Only)       Balance Overall balance assessment: History of Falls;Needs assistance Sitting-balance support: Feet supported Sitting balance-Leahy Scale: Good     Standing balance support: Bilateral upper extremity supported;During functional activity Standing balance-Leahy Scale: Fair                              Cognition Arousal/Alertness: Awake/alert Behavior During Therapy: Flat affect Overall Cognitive Status: History of cognitive impairments - at baseline(presumed) Area of Impairment: Orientation                 Orientation Level: Time;Situation Current Attention Level: Sustained   Following Commands: Follows one step commands with increased time Safety/Judgement: Decreased awareness of safety;Decreased awareness of deficits Awareness: Intellectual Problem Solving: Decreased initiation;Difficulty sequencing;Requires verbal cues        Exercises      General Comments        Pertinent Vitals/Pain Pain Assessment: No/denies pain Faces Pain Scale: Hurts a little bit Pain Location: generaldiscomfort    Home Living                      Prior Function            PT Goals (current goals can now be found in the care plan section) Acute Rehab PT  Goals Patient Stated Goal: Eat ice cream/drink sprite Progress towards PT goals: Progressing toward goals    Frequency    Min 2X/week      PT Plan Discharge plan needs to be updated    Co-evaluation              AM-PAC PT "6 Clicks" Mobility   Outcome Measure  Help needed turning from your back to your side while in a flat bed without using bedrails?: A Little Help needed moving from lying on your back to sitting on  the side of a flat bed without using bedrails?: A Little Help needed moving to and from a bed to a chair (including a wheelchair)?: A Little Help needed standing up from a chair using your arms (e.g., wheelchair or bedside chair)?: A Little Help needed to walk in hospital room?: A Lot Help needed climbing 3-5 steps with a railing? : A Lot 6 Click Score: 16    End of Session Equipment Utilized During Treatment: Gait belt Activity Tolerance: Patient tolerated treatment well Patient left: in chair;with call bell/phone within reach;with chair alarm set Nurse Communication: Mobility status PT Visit Diagnosis: Unsteadiness on feet (R26.81);Difficulty in walking, not elsewhere classified (R26.2);Repeated falls (R29.6);Other symptoms and signs involving the nervous system (S93.734)     Time: 2876-8115 PT Time Calculation (min) (ACUTE ONLY): 20 min  Charges:  $Gait Training: 8-22 mins                     Blanchard Kelch PT Acute Rehabilitation Services Pager [phone] Office [phone]    Rada Hay 09/15/2019, 12:54 PM

## 2019-09-15 NOTE — Care Management Important Message (Signed)
Important Message  Patient Details  Name: Alexandria Harvey MRN: [id number] Date of Birth: 11-Jun-1981   Medicare Important Message Given:  Yes - Important Message mailed due to current National Emergency  Verbal consent obtained due to current National Emergency  Relationship to patient: Guardian Contact Name: Benard Halsted Call Date: 09/15/19  Time: 1155 Phone: [phone] Outcome: Spoke with contact Important Message mailed to: Patient address on file    Orson Aloe 09/15/2019, 11:56 AM

## 2019-09-15 NOTE — TOC Transition Note (Addendum)
Transition of Care [facility]) - CM/SW Discharge Note   Patient Details  Name: Alexandria Harvey MRN: [id number] Date of Birth: 12-Jun-1981  Transition of Care [facility]) CM/SW Contact:  Colleen Can MSN, RN, NCM-BC, ACM-RN [phone] Phone Number: 09/15/2019, 11:16 AM   Clinical Narrative:    CM following for dispositional needs. CM spoke to the patients SW/legal guardian, [name], to discuss the POC; Sheri requested the CM contact Sherrell Puller [facility] caregiver). The patient is a resident of [facility]. Per [name], the patient can return to the facility once she is medically stable with any recommended services; the facility has an elevated toilet-has declined the [facility]. CMS HH compare/DME preferences provided with no preference. HH referral given to Dara Hoyer RN [facility] liaison); DME referral given to Zack ([facility] liaison); AVS updated. The RW will be provided prior to discharge with the facility to provide transportation. DC summary faxed to [phone]   Final next level of care: Group Home Barriers to Discharge: No Barriers Identified   Patient Goals and CMS Choice   CMS Medicare.gov Compare Post Acute Care list provided to:: Legal Guardian([name] Lodema Hong [name] (guardian)) Choice offered to / list presented to : [facility] POA / Guardian([name] Lodema Hong [name] (guardian))   Discharge Plan and Services                DME Arranged: Dan Humphreys rolling DME Agency: [facility] Date DME Agency Contacted: 09/15/19 Time DME Agency Contacted: 1044 Representative spoke with at DME Agency: Zack (liaison) HH Arranged: PT, OT HH Agency: [facility] Date HH Agency Contacted: 09/15/19 Time HH Agency Contacted: 1044 Representative spoke with at [facility] Agency: Dara Hoyer RN (liaison)  Social Determinants of Health (SDOH) Interventions     Readmission Risk Interventions No flowsheet data found.

## 2019-09-15 NOTE — Discharge Summary (Signed)
[facility]  Physician Discharge Summary   Patient ID: Alexandria Harvey MRN: [id number] DOB/AGE: 1980-12-04 39 y.o.  Admit date: 09/08/2019 Discharge date: 09/15/2019  PCP: Laurance Flatten, MD  DISCHARGE DIAGNOSES:  Pneumonia due to COVID-19, improved Acute respiratory failure with hypoxia, improved Aspiration pneumonia Acute metabolic encephalopathy, back to baseline History of schizoaffective disorder History of intellectual disabilities Uncontrolled diabetes mellitus type 2 with hyperglycemia Thrombocytopenia, resolved Essential hypertension Elevated LFTs   RECOMMENDATIONS FOR OUTPATIENT FOLLOW UP: 1. Please check CBC and complete metabolic panel in 1 week 2. Home health has been ordered   Home Health: PT and OT Equipment/Devices: Rolling walker and 3 n 1  CODE STATUS: Full code  DISCHARGE CONDITION: fair  Diet recommendation: As before  INITIAL HISTORY: Alexandria Harvey is an 39 y.o. female past medical history significant for schizoaffective disorder, intellectual disability diabetes mellitus type 2 and essential hypertension who presents with significant decrease in level of consciousness that started a few days prior to admission.  As per facility the patient has not been conversing much for the last several days in the ED she was found to be hypoxic chest x-ray showed bilateral infiltrates and SARS-CoV-2 PCR was positive.   HOSPITAL COURSE:    Acute respiratory failure with hypoxia secondarily to COVID-19 pneumonia/sepsis due to aspiration pneumonia: Patient was hospitalized.  She was started on remdesivir and steroids.  She has completed a course of same.  There was also some concern for aspiration so the patient was started on Unasyn.  Now on Augmentin.  Seen by speech therapy.  Sepsis physiology resolved.  Inflammatory markers improved.  She is saturating normal on room air now.  She will need home health PT and OT.  Continue with incentive  spirometry and mobilization.  Acute Metabolic encephalopathy: Likely due to COVID-19.  CT head did not show any acute findings.  Now seems to be back to her baseline.    History of schizoaffective disorder/intellectual disability Patient lives in a group home.  Seems to be close to her baseline.  Has a legal guardian.  Would prefer that she be able to return to her group home when stable.  Uncontrolled diabetes mellitus type 2 with hyperglycemia: HbA1c 6.3.    May resume Metformin  Elevated LFTs: Likely due to infectious etiology.  Improving gradually.  Thrombocytopenia: Likely due to infectious etiology, now resolved.  Essential hypertension: Continue home medication  Anion gap metabolic acidosis due to lactic acidosis: Resolved  Hyperkalemia: She was given oral Kayexalate. Now resolved.  Normocytic anemia: Stable.  No evidence of overt blood loss.  Sacral decubitus ulcer stage II present on admission: RN Pressure Injury Documentation: Pressure Injury 09/09/19 Sacrum Left;Right Stage 2 -  Partial thickness loss of dermis presenting as a shallow open injury with a red, pink wound bed without slough. (Active)  09/09/19 2300  Location: Sacrum  Location Orientation: Left;Right  Staging: Stage 2 -  Partial thickness loss of dermis presenting as a shallow open injury with a red, pink wound bed without slough.  Wound Description (Comments):   Present on Admission: Yes    Overall stable.  Okay for discharge back to her group home today with home health.    PERTINENT LABS:  The results of significant diagnostics from this hospitalization (including imaging, microbiology, ancillary and laboratory) are listed below for reference.    Microbiology: Recent Results (from the past 240 hour(s))  Blood Culture (routine x 2)     Status: None   Collection Time: 09/08/19  7:41 PM   Specimen: BLOOD  Result Value Ref Range Status   Specimen Description BLOOD RIGHT  ANTECUBITAL  Final   Special Requests   Final    BOTTLES DRAWN AEROBIC AND ANAEROBIC Blood Culture adequate volume   Culture   Final    NO GROWTH 5 DAYS Performed at [facility], [address], [place], [place] [address]    Report Status 09/13/2019 FINAL  Final  Blood Culture (routine x 2)     Status: None   Collection Time: 09/08/19  7:58 PM   Specimen: BLOOD  Result Value Ref Range Status   Specimen Description BLOOD LEFT ANTECUBITAL  Final   Special Requests   Final    BOTTLES DRAWN AEROBIC AND ANAEROBIC Blood Culture adequate volume   Culture   Final    NO GROWTH 5 DAYS Performed at [facility], [place] [address] [place], [place] [address]    Report Status 09/13/2019 FINAL  Final  MRSA PCR Screening     Status: None   Collection Time: 09/09/19 12:30 AM   Specimen: Nasal Mucosa; Nasopharyngeal  Result Value Ref Range Status   MRSA by PCR NEGATIVE NEGATIVE Final    Comment:        The GeneXpert MRSA Assay (FDA approved for NASAL specimens only), is one component of a comprehensive MRSA colonization surveillance program. It is not intended to diagnose MRSA infection nor to guide or monitor treatment for MRSA infections. Performed at [facility], [place] [address] [place], [place] [address]      Labs:    Basic Metabolic Panel: Recent Labs  Lab 09/10/19 0417 09/11/19 0455 09/12/19 0520 09/13/19 0220 09/14/19 0320  NA 139 140 141 141 139  K 5.0 4.5 4.4 4.3 4.0  CL 108 104 103 102 102  CO2 22 23 26 27 25   GLUCOSE 90 158* 142* 111* 114*  BUN 13 9 12 14 12   CREATININE 0.38* 0.45 0.48 0.42* 0.35*  CALCIUM 8.1* 8.2* 8.2* 8.3* 8.2*  MG 2.0  --   --   --   --   PHOS 2.4*  --   --   --   --    Liver Function Tests: Recent Labs  Lab 09/10/19 0417 09/11/19 0455 09/12/19 0520 09/13/19 0220 09/14/19 0320  AST 111* 83* 76* 54* 33  ALT 15 23 30 19 14   ALKPHOS 37* 40 48 48 51  BILITOT 0.7 0.6 0.3 0.8 0.6  PROT 5.1* 5.0* 5.5* 5.7* 5.4*    ALBUMIN 1.9* 1.8* 2.0* 2.2* 2.0*   CBC: Recent Labs  Lab 09/10/19 0417 09/11/19 0455 09/12/19 0520 09/13/19 1205 09/14/19 0320  WBC 9.4 9.3 10.9* 16.4* 14.4*  NEUTROABS 6.6 6.0 8.5* 10.4* 9.6*  HGB 10.0* 9.8* 10.3* 11.5* 10.0*  HCT 31.4* 30.5* 32.2* 35.6* 31.3*  MCV 91.0 91.0 89.2 89.9 89.9  PLT 128* 168 236 279 303    CBG: Recent Labs  Lab 09/14/19 0804 09/14/19 1206 09/14/19 1609 09/15/19 0805 09/15/19 1137  GLUCAP 77 159* 131* 80 183*     IMAGING STUDIES CT Head Wo Contrast  Result Date: 09/08/2019 CLINICAL DATA:  Multiple falls, bruised right forehead EXAM: CT HEAD WITHOUT CONTRAST CT CERVICAL SPINE WITHOUT CONTRAST TECHNIQUE: Multidetector CT imaging of the head and cervical spine was performed following the standard protocol without intravenous contrast. Multiplanar CT image reconstructions of the cervical spine were also generated. COMPARISON:  MR brain, 07/24/2019, CT cervical spine, 02/07/2019 FINDINGS: CT HEAD  FINDINGS Brain: No evidence of acute infarction, hemorrhage, hydrocephalus, extra-axial collection or mass lesion/mass effect. Vascular: No hyperdense vessel or unexpected calcification. Skull: Hyperostosis. Negative for fracture or focal lesion. Sinuses/Orbits: No acute finding. Other: Soft tissue contusion of the forehead. CT CERVICAL SPINE FINDINGS Alignment: Normal. Skull base and vertebrae: No acute fracture. No primary bone lesion or focal pathologic process. Soft tissues and spinal canal: No prevertebral fluid or swelling. No visible canal hematoma. Disc levels:  Intact. Upper chest: Extensive heterogeneous airspace opacity of the included right upper lobe. Other: None. IMPRESSION: 1. No acute intracranial pathology. 2. Soft tissue contusion of the forehead. 3. No fracture or static subluxation of the cervical spine. 4. Extensive heterogeneous airspace opacity of the included right upper lobe, concerning for pneumonia. Electronically Signed   By: Eddie Candle  M.D.   On: 09/08/2019 20:56   CT Cervical Spine Wo Contrast  Result Date: 09/08/2019 CLINICAL DATA:  Multiple falls, bruised right forehead EXAM: CT HEAD WITHOUT CONTRAST CT CERVICAL SPINE WITHOUT CONTRAST TECHNIQUE: Multidetector CT imaging of the head and cervical spine was performed following the standard protocol without intravenous contrast. Multiplanar CT image reconstructions of the cervical spine were also generated. COMPARISON:  MR brain, 07/24/2019, CT cervical spine, 02/07/2019 FINDINGS: CT HEAD FINDINGS Brain: No evidence of acute infarction, hemorrhage, hydrocephalus, extra-axial collection or mass lesion/mass effect. Vascular: No hyperdense vessel or unexpected calcification. Skull: Hyperostosis. Negative for fracture or focal lesion. Sinuses/Orbits: No acute finding. Other: Soft tissue contusion of the forehead. CT CERVICAL SPINE FINDINGS Alignment: Normal. Skull base and vertebrae: No acute fracture. No primary bone lesion or focal pathologic process. Soft tissues and spinal canal: No prevertebral fluid or swelling. No visible canal hematoma. Disc levels:  Intact. Upper chest: Extensive heterogeneous airspace opacity of the included right upper lobe. Other: None. IMPRESSION: 1. No acute intracranial pathology. 2. Soft tissue contusion of the forehead. 3. No fracture or static subluxation of the cervical spine. 4. Extensive heterogeneous airspace opacity of the included right upper lobe, concerning for pneumonia. Electronically Signed   By: Eddie Candle M.D.   On: 09/08/2019 20:56   DG Pelvis Portable  Result Date: 09/08/2019 CLINICAL DATA:  Acute pain due to trauma. EXAM: PORTABLE PELVIS 1-2 VIEWS COMPARISON:  None. FINDINGS: There is no acute displaced fracture or dislocation. There is mild bilateral hip osteoarthritis. IMPRESSION: Negative. Electronically Signed   By: Constance Holster M.D.   On: 09/08/2019 22:08   DG CHEST PORT 1 VIEW  Result Date: 09/10/2019 CLINICAL DATA:  Dyspnea.  EXAM: PORTABLE CHEST 1 VIEW COMPARISON:  09/08/2019 FINDINGS: Heart size is normal. No pleural effusion identified. Asymmetric elevation of right hemidiaphragm, new. Multifocal airspace densities are identified throughout the right lung and appear new from previous exam. Left lung clear. IMPRESSION: 1. New right lung airspace densities compatible with pneumonia. 2. New asymmetric elevation of right hemidiaphragm. Electronically Signed   By: Kerby Moors M.D.   On: 09/10/2019 12:37   DG Chest Portable 1 View  Result Date: 09/08/2019 CLINICAL DATA:  Pain status post fall EXAM: PORTABLE CHEST 1 VIEW COMPARISON:  February 07, 2019 FINDINGS: There are new airspace opacities scattered throughout the right lung zone. The left lung field appears essentially clear. There may be some right-sided volume loss. There is no pneumothorax. No large pleural effusion. The heart size is stable from prior study. There is no acute osseous abnormality. IMPRESSION: 1. No acute traumatic abnormality. 2. Scattered airspace opacities primarily throughout the right lung field is concerning  for pneumonia or aspiration. Follow-up to radiologic resolution is recommended. Electronically Signed   By: Constance Holster M.D.   On: 09/08/2019 22:10   DG Femur Portable Min 2 Views Right  Result Date: 09/08/2019 CLINICAL DATA:  Pain EXAM: RIGHT FEMUR PORTABLE 2 VIEW COMPARISON:  None. FINDINGS: There is no evidence of fracture or other focal bone lesions. Soft tissues are unremarkable. There is mild right hip osteoarthritis. IMPRESSION: Negative. Electronically Signed   By: Constance Holster M.D.   On: 09/08/2019 22:08    DISCHARGE EXAMINATION: Vitals:   09/14/19 2000 09/15/19 0400 09/15/19 0500 09/15/19 0805  BP: 136/89   133/85  Pulse: 89   88  Resp: 20 18  18   Temp: 97.9 F (36.6 C)   98.5 F (36.9 C)  TempSrc: Oral   Oral  SpO2: 95%   95%  Weight:   66.9 kg   Height:       General appearance: much more alert and awake today  than she was previously Resp: Clear to auscultation bilaterally.  Normal effort Cardio: S1-S2 is normal regular.  No S3-S4.  No rubs murmurs or bruit GI: Abdomen is soft.  Nontender nondistended.  Bowel sounds are present normal.  No masses organomegaly   DISPOSITION: Back to her group home  Discharge Instructions    Call MD for:  difficulty breathing, headache or visual disturbances   Complete by: As directed    Call MD for:  extreme fatigue   Complete by: As directed    Call MD for:  persistant dizziness or light-headedness   Complete by: As directed    Call MD for:  persistant nausea and vomiting   Complete by: As directed    Call MD for:  severe uncontrolled pain   Complete by: As directed    Call MD for:  temperature >100.4   Complete by: As directed    Discharge instructions   Complete by: As directed    Please take your medications as prescribed.  Follow-up with your primary care provider.  COVID 19 INSTRUCTIONS  - You are felt to be stable enough to no longer require inpatient monitoring, testing, and treatment, though you will need to follow the recommendations below: - Based on the CDC's non-test criteria for ending self-isolation: You may not return to work/leave the home until at least 21 days since symptom onset AND 3 days without a fever (without taking tylenol, ibuprofen, etc.) AND have improvement in respiratory symptoms. - Do not take NSAID medications (including, but not limited to, ibuprofen, advil, motrin, naproxen, aleve, goody's powder, etc.) - Follow up with your doctor in the next week via telehealth or seek medical attention right away if your symptoms get WORSE.  - Consider donating plasma after you have recovered (either 14 days after a negative test or 28 days after symptoms have completely resolved) because your antibodies to this virus may be helpful to give to others with life-threatening infections. Please go to the website www.oneblood.org if you would  like to consider volunteering for plasma donation.    Directions for you at home:  Wear a facemask You should wear a facemask that covers your nose and mouth when you are in the same room with other people and when you visit a healthcare provider. People who live with or visit you should also wear a facemask while they are in the same room with you.  Separate yourself from other people in your home As much as possible, you should stay  in a different room from other people in your home. Also, you should use a separate bathroom, if available.  Avoid sharing household items You should not share dishes, drinking glasses, cups, eating utensils, towels, bedding, or other items with other people in your home. After using these items, you should wash them thoroughly with soap and water.  Cover your coughs and sneezes Cover your mouth and nose with a tissue when you cough or sneeze, or you can cough or sneeze into your sleeve. Throw used tissues in a lined trash can, and immediately wash your hands with soap and water for at least 20 seconds or use an alcohol-based hand rub.  Wash your [facility] your hands often and thoroughly with soap and water for at least 20 seconds. You can use an alcohol-based hand sanitizer if soap and water are not available and if your hands are not visibly dirty. Avoid touching your eyes, nose, and mouth with unwashed hands.  Directions for those who live with, or provide care at home for you:  Limit the number of people who have contact with the patient If possible, have only one caregiver for the patient. Other household members should stay in another home or place of residence. If this is not possible, they should stay in another room, or be separated from the patient as much as possible. Use a separate bathroom, if available. Restrict visitors who do not have an essential need to be in the home.  Ensure good ventilation Make sure that shared spaces in the  home have good air flow, such as from an air conditioner or an opened window, weather permitting.  Wash your hands often Wash your hands often and thoroughly with soap and water for at least 20 seconds. You can use an alcohol based hand sanitizer if soap and water are not available and if your hands are not visibly dirty. Avoid touching your eyes, nose, and mouth with unwashed hands. Use disposable paper towels to dry your hands. If not available, use dedicated cloth towels and replace them when they become wet.  Wear a facemask and gloves Wear a disposable facemask at all times in the room and gloves when you touch or have contact with the patient's blood, body fluids, and/or secretions or excretions, such as sweat, saliva, sputum, nasal mucus, vomit, urine, or feces.  Ensure the mask fits over your nose and mouth tightly, and do not touch it during use. Throw out disposable facemasks and gloves after using them. Do not reuse. Wash your hands immediately after removing your facemask and gloves. If your personal clothing becomes contaminated, carefully remove clothing and launder. Wash your hands after handling contaminated clothing. Place all used disposable facemasks, gloves, and other waste in a lined container before disposing them with other household waste. Remove gloves and wash your hands immediately after handling these items.  Do not share dishes, glasses, or other household items with the patient Avoid sharing household items. You should not share dishes, drinking glasses, cups, eating utensils, towels, bedding, or other items with a patient who is confirmed to have, or being evaluated for, COVID-19 infection. After the person uses these items, you should wash them thoroughly with soap and water.  Wash laundry thoroughly Immediately remove and wash clothes or bedding that have blood, body fluids, and/or secretions or excretions, such as sweat, saliva, sputum, nasal mucus, vomit,  urine, or feces, on them. Wear gloves when handling laundry from the patient. Read and follow directions on labels  of laundry or clothing items and detergent. In general, wash and dry with the warmest temperatures recommended on the label.  Clean all areas the individual has used often Clean all touchable surfaces, such as counters, tabletops, doorknobs, bathroom fixtures, toilets, phones, keyboards, tablets, and bedside tables, every day. Also, clean any surfaces that may have blood, body fluids, and/or secretions or excretions on them. Wear gloves when cleaning surfaces the patient has come in contact with. Use a diluted bleach solution (e.g., dilute bleach with 1 part bleach and 10 parts water) or a household disinfectant with a label that says EPA-registered for coronaviruses. To make a bleach solution at home, add 1 tablespoon of bleach to 1 quart (4 cups) of water. For a larger supply, add  cup of bleach to 1 gallon (16 cups) of water. Read labels of cleaning products and follow recommendations provided on product labels. Labels contain instructions for safe and effective use of the cleaning product including precautions you should take when applying the product, such as wearing gloves or eye protection and making sure you have good ventilation during use of the product. Remove gloves and wash hands immediately after cleaning.  Monitor yourself for signs and symptoms of illness Caregivers and household members are considered close contacts, should monitor their health, and will be asked to limit movement outside of the home to the extent possible. Follow the monitoring steps for close contacts listed on the symptom monitoring form.   If you have additional questions, contact your local health department or call the epidemiologist on call at [phone] (available 24/7). This guidance is subject to change. For the most up-to-date guidance from [facility], please refer to their  website: YouBlogs.pl   You were cared for by a hospitalist during your hospital stay. If you have any questions about your discharge medications or the care you received while you were in the hospital after you are discharged, you can call the unit and asked to speak with the hospitalist on call if the hospitalist that took care of you is not available. Once you are discharged, your primary care physician will handle any further medical issues. Please note that NO REFILLS for any discharge medications will be authorized once you are discharged, as it is imperative that you return to your primary care physician (or establish a relationship with a primary care physician if you do not have one) for your aftercare needs so that they can reassess your need for medications and monitor your lab values. If you do not have a primary care physician, you can call [phone] for a physician referral.   Increase activity slowly   Complete by: As directed         Allergies as of 09/15/2019      Reactions   Haloperidol Lactate Other (See Comments)   Unknown reaction   Other Rash   Strawberries   Shrimp [shellfish Allergy] Hives, Rash      Medication List    TAKE these medications   acetaminophen 650 MG CR tablet Commonly known as: TYLENOL Take 650 mg by mouth 3 (three) times daily as needed for pain.   amoxicillin-clavulanate 875-125 MG tablet Commonly known as: AUGMENTIN Take 1 tablet by mouth every 12 (twelve) hours for 4 days.   atenolol 50 MG tablet Commonly known as: TENORMIN Take 1 tablet (50 mg total) by mouth daily. What changed: when to take this   atorvastatin 80 MG tablet Commonly known as: LIPITOR Take 1 tablet (80 mg total) by  mouth at bedtime.   benztropine 1 MG tablet Commonly known as: COGENTIN Take 1 mg by mouth 2 (two) times daily.   carbidopa-levodopa 25-100 MG tablet Commonly known as: SINEMET IR TAKE 1/2  TA 1 TABLET 3 TIMES DAILY. What changed:   how much to take  how to take this  when to take this  additional instructions   cloZAPine 100 MG tablet Commonly known as: CLOZARIL Take 100 mg by mouth 2 (two) times daily.   dicyclomine 10 MG capsule Commonly known as: BENTYL Take 1 capsule (10 mg total) by mouth every 8 (eight) hours as needed for spasms.   divalproex 500 MG DR tablet Commonly known as: DEPAKOTE Take 1 tablet (500 mg total) by mouth 3 (three) times daily. What changed:   how much to take  when to take this  additional instructions   EPINEPHrine 0.3 mg/0.3 mL Soaj injection Commonly known as: EPI-PEN Inject 0.3 mg into the muscle once as needed for anaphylaxis (severe allergic reaction).   ferrous sulfate 325 (65 FE) MG tablet Take 1 tablet (325 mg total) by mouth daily with breakfast.   lisinopril 2.5 MG tablet Commonly known as: ZESTRIL Take 1 tablet (2.5 mg total) by mouth daily. What changed: when to take this   LORazepam 0.5 MG tablet Commonly known as: ATIVAN Take 0.5 mg by mouth See admin instructions. Take one tablet (0.5 mg) by mouth one hour prior to lab, ekg, dental appointments, etc.   lubiprostone 24 MCG capsule Commonly known as: AMITIZA Take 24 mcg by mouth daily with breakfast.   medroxyPROGESTERone 150 MG/ML injection Commonly known as: DEPO-PROVERA Inject 150 mg into the muscle every 3 (three) months.   metFORMIN 500 MG 24 hr tablet Commonly known as: GLUCOPHAGE-XR Take 1,000 mg by mouth daily with breakfast.   nystatin cream Commonly known as: MYCOSTATIN Apply 1 application topically See admin instructions. Apply topically to genital area twice daily as needed for rash   omeprazole 20 MG capsule Commonly known as: PRILOSEC Take 20 mg by mouth daily with breakfast.   ONE TOUCH ULTRA 2 w/Device Kit by Does not apply route. Use to check blood sugar as directed.   polyethylene glycol 17 g packet Commonly known as:  MIRALAX / GLYCOLAX Take 17 g by mouth 2 (two) times daily. What changed: additional instructions   saccharomyces boulardii 250 MG capsule Commonly known as: FLORASTOR Take 1 capsule (250 mg total) by mouth 2 (two) times daily for 10 days.   simethicone 80 MG chewable tablet Commonly known as: MYLICON Chew 80 mg by mouth 2 (two) times daily.   Vitamin D (Ergocalciferol) 1.25 MG (50000 UT) Caps capsule Commonly known as: DRISDOL Take 50,000 Units by mouth See admin instructions. Take one capsule (50,000 units) by mouth twice weekly - on Tuesday and Friday            Durable Medical Equipment  (From admission, onward)         Start     Ordered   09/15/19 1205  For home use only DME 3 n 1  Once     09/15/19 1204   09/15/19 1053  For home use only DME Walker rolling  Once    Question Answer Comment  Walker: With [place] Wheels   Patient needs a walker to treat with the following condition Physical deconditioning      09/15/19 1052           Follow-up Information    Eulas Post,  Robert Bellow, MD Follow up in 2 week(s).   Specialty: Internal Medicine       [facility] Follow up.   Why: rolling walker Contact information: [place] [address] [phone]        [facility] Follow up.   Specialty: [place] Why: home health physical and occupational therapy Contact information: [place] [address] [phone]           TOTAL DISCHARGE TIME: 8 minutes  [place]  [facility] Pager on [email or web address]  09/15/2019, 12:04 PM

## 2019-09-15 NOTE — Plan of Care (Signed)
  Problem: Respiratory: Goal: Will maintain a patent airway Outcome: Progressing Goal: Complications related to the disease process, condition or treatment will be avoided or minimized Outcome: Progressing   

## 2019-09-16 LAB — GLUCOSE, CAPILLARY
Glucose-Capillary: 131 mg/dL — ABNORMAL HIGH (ref 70–99)
Glucose-Capillary: 154 mg/dL — ABNORMAL HIGH (ref 70–99)

## 2019-09-16 NOTE — Progress Notes (Signed)
Patients caregiver, Clearnce Sorrel, called back to check on patient.  I informed her that we are still waiting on transportation.  She states patient will not be able to come this late due to everyone is going to bed.  She states she will be here at 9am in the morning to pick up patient herself.  Number to reach her in am is [phone].  Patient made aware that she is not going home tonight.  Patient upset and began to cry.  I once again tried to administer medications and she again refused.

## 2019-09-16 NOTE — Progress Notes (Signed)
Patient could not leave yesterday as expected due to transportation issues.  Her group home is sending somebody to pick her up this morning.  Patient denies any complaints.  No overnight issues except that patient was very upset that she could not get to go back to her group home yesterday.  Vital signs have been stable.  Okay for discharge.  Please review discharge summary from yesterday.  Osvaldo Shipper 09/16/2019

## 2019-09-16 NOTE — Progress Notes (Signed)
I attempted to call patients caregivier, Clearnce Sorrel, and update on transportation is still delayed.  I received no answer and number has no voicemail set up. I attempted to administer PM meds and patient refusing to take meds states she is going "home."  I explained to her that it was getting late and she really needed her medications.  Patient states, "no!" and refuses to open her mouth.

## 2019-09-16 NOTE — Plan of Care (Signed)
Rested well all night with no respiratory issues.  Education done with caregiver on phone yesterday evening.  Problem: Coping: Goal: Psychosocial and spiritual needs will be supported Outcome: Progressing   Problem: Respiratory: Goal: Will maintain a patent airway Outcome: Progressing Goal: Complications related to the disease process, condition or treatment will be avoided or minimized Outcome: Progressing

## 2019-09-16 NOTE — Progress Notes (Signed)
Patients ALF worker, Clearnce Sorrel, called to check on status of patients discharge.  I made her aware that we were waiting on transportation who was currently backed up.

## 2019-09-16 NOTE — Progress Notes (Signed)
Called Legal guardian to check on ride, they stated they did not know they where supposed to pick her up, but will be here in 45 min.

## 2019-09-18 ENCOUNTER — Other Ambulatory Visit: Payer: Medicare Other

## 2019-09-19 ENCOUNTER — Telehealth: Payer: Self-pay

## 2019-09-19 NOTE — Telephone Encounter (Signed)
Darlene with [facility] called to report start of care will begin on Friday which is outside of the 48hrs.   Please follow up.

## 2019-09-22 ENCOUNTER — Telehealth: Payer: Self-pay | Admitting: Neurology

## 2019-09-22 DIAGNOSIS — R269 Unspecified abnormalities of gait and mobility: Secondary | ICD-10-CM

## 2019-09-22 NOTE — Telephone Encounter (Signed)
Alexandria Harvey from [facility] called stating he is needing VO for once a week for 8 weeks and also would like to know if provider can put in a referral for a 3 in 1 bed for the pt. Please advise.

## 2019-09-25 NOTE — Telephone Encounter (Signed)
Joselyn Glassman called again with the fax number of [phone]   If any questions Tyler's phone number is [phone]

## 2019-09-25 NOTE — Telephone Encounter (Signed)
The DME prescription was faxed.

## 2019-09-25 NOTE — Telephone Encounter (Signed)
I gave a voice order for physical, occupational therapy for the next [redacted] weeks along with nursing care for this patient.  They will let me know with the fax number is to send in the prescription for the 3 in 1 chair for home use.

## 2019-09-26 ENCOUNTER — Telehealth: Payer: Self-pay | Admitting: Neurology

## 2019-09-26 NOTE — Telephone Encounter (Signed)
I returned the call to Sidell at [facility]. They have assessed the patient and will initiate therapy as outlined below.

## 2019-09-26 NOTE — Telephone Encounter (Signed)
Company: [facility] [employer])  Frequency: 8 week 2   OT,PT,HOME HEALTH: Nursing  CB# [phone]

## 2019-10-02 NOTE — Telephone Encounter (Signed)
I called Alexandria Harvey at [facility] care that orders for OT was given on 09/26/2019. I stated same orders for OT was given to [facility] on 09/26/2019. Alexandria Harvey verbalized understanding.

## 2019-10-02 NOTE — Telephone Encounter (Signed)
Alexandria Harvey with [facility] CB#[phone] OT for 1 for 8 weeks for ADL strengthenging

## 2019-10-10 ENCOUNTER — Telehealth: Payer: Self-pay | Admitting: Neurology

## 2019-10-10 NOTE — Telephone Encounter (Signed)
noted 

## 2019-10-10 NOTE — Telephone Encounter (Signed)
Cara from [facility] called in and stated that she has to change wound care orders to 40% zinc paste only to her buttocks due to the dressings not staying in place. No call back is needed unless there is a problem with the order.

## 2019-10-11 ENCOUNTER — Telehealth: Payer: Self-pay | Admitting: Neurology

## 2019-10-11 NOTE — Telephone Encounter (Signed)
I called Alexandria Harvey at [facility] care about receiving wound care orders for patient. I stated pt was recently discharge from hospital with COVID 19, and Dr Anne Hahn had order PT, OT and nursing care earlier this month. I stated was the wound a old order or new. She stated the nurse went to patients house last week and assess the pt and assess she had some wounds on left buttocks when discharge from hospital. I  stated message will be sent to Dr. Anne Hahn if he wants to be the covering MD for pts wound care.

## 2019-10-11 NOTE — Telephone Encounter (Signed)
Sent to Dr Anne Hahn as a Lorain Childes

## 2019-10-11 NOTE — Telephone Encounter (Signed)
Tyler from [facility] called to report that before he got to pt's home pt fell in the hallway of her home.  Pt loss control of her legs.  Joselyn Glassman said there are a few minor scratches on her left arm but is other wise fine. No call back requested

## 2019-10-11 NOTE — Telephone Encounter (Signed)
Events noted

## 2019-10-11 NOTE — Telephone Encounter (Signed)
I called Alexandria Harvey that Dr. Anne Hahn sign orders to be the provider for pts wound care orders. I stated it was tax twice and confirmed. Alexandria Harvey verbalized understanding.

## 2019-10-16 ENCOUNTER — Telehealth: Payer: Self-pay | Admitting: Neurology

## 2019-10-16 NOTE — Telephone Encounter (Signed)
[name] called Patient's care giver and relayed patient's LP had to be CX patient was in the hospital . I have reached out to [facility] can't reach [place] because her voice mail is full and she cant leave a message. Jenel Lucks will keep trying to call . Jenel Lucks will call me back with scheduled apt. Jenel Lucks and [name] are aware that Dr. Anne Hahn is out of the office this week.   Katrina After I get LP scheduled I will let you no because Dr. Anne Hahn wants patient to be seen in 24 hours after LP.   Thanks Annabelle Harman

## 2019-10-17 NOTE — Telephone Encounter (Signed)
Alexandria Harvey returned call and is needing to speak to RN. Please advise.

## 2019-10-17 NOTE — Telephone Encounter (Signed)
[name](pt's care taker) has called asking to speak with RN re: getting pt seen by Dr Anne Hahn 24 hours after pt's [place], please call

## 2019-10-17 NOTE — Telephone Encounter (Addendum)
Dr. Anne Hahn Alexandria Harvey is having LP on 10/24/2019 Monday.What time on Tuesday can she come in for an appt. Let me know thanks

## 2019-10-17 NOTE — Telephone Encounter (Signed)
If caretaker [name] calls back pt having LP on Thursday. Our office is not open on Friday.   I tried to call [name] number but vm was full. I will send message to Dr. Anne Hahn about where to schedule pt. Message has been sent to him to review on when he can see pt next week.

## 2019-10-17 NOTE — Telephone Encounter (Signed)
I called [name] about Dr. Sima Matas wanting to see pt in the office 24 hours after LP. I stated our office is not open on this Friday. Pt has LP schedule for Thursday. I advise her to Dr. Anne Hahn is in the office next week and we can get her in 24 hours after the LP when she reschedules. [name] will call GI to r/s LP.

## 2019-10-19 ENCOUNTER — Other Ambulatory Visit: Payer: Medicare Other

## 2019-10-19 ENCOUNTER — Other Ambulatory Visit: Payer: Self-pay | Admitting: Physician Assistant

## 2019-10-19 DIAGNOSIS — Z79899 Other long term (current) drug therapy: Secondary | ICD-10-CM

## 2019-10-19 NOTE — Telephone Encounter (Signed)
I called Alexandria Harvey pts caregiver about pt seeing Dr. Anne Hahn on 10/25/2019 after her LP. Pt is having LP on 10/24/2019 at 0800am. I gave appt for next day to check in at 0400pm. [name] has appt and verbalized understanding.

## 2019-10-23 ENCOUNTER — Other Ambulatory Visit: Payer: Self-pay

## 2019-10-23 ENCOUNTER — Other Ambulatory Visit: Payer: Medicare Other

## 2019-10-23 ENCOUNTER — Ambulatory Visit
Admission: RE | Admit: 2019-10-23 | Discharge: 2019-10-23 | Disposition: A | Discharge status: Home / Self Care | Payer: Medicare Other | Source: Ambulatory Visit | Attending: Physician Assistant | Admitting: Physician Assistant

## 2019-10-23 DIAGNOSIS — Z79899 Other long term (current) drug therapy: Secondary | ICD-10-CM

## 2019-10-24 ENCOUNTER — Ambulatory Visit
Admission: RE | Admit: 2019-10-24 | Discharge: 2019-10-24 | Disposition: A | Discharge status: Home / Self Care | Payer: Medicare Other | Source: Ambulatory Visit | Attending: Neurology | Admitting: Neurology

## 2019-10-24 ENCOUNTER — Other Ambulatory Visit: Payer: Self-pay

## 2019-10-24 DIAGNOSIS — R269 Unspecified abnormalities of gait and mobility: Secondary | ICD-10-CM

## 2019-10-24 LAB — CSF CELL COUNT WITH DIFFERENTIAL
RBC Count, CSF: 1 cells/uL — ABNORMAL HIGH
WBC, CSF: 0 cells/uL (ref 0–5)

## 2019-10-24 LAB — PROTEIN, CSF: Total Protein, CSF: 43 mg/dL (ref 15–45)

## 2019-10-24 LAB — GLUCOSE, CSF: Glucose, CSF: 60 mg/dL (ref 40–80)

## 2019-10-24 NOTE — Discharge Instructions (Signed)

## 2019-10-25 ENCOUNTER — Other Ambulatory Visit: Payer: Self-pay

## 2019-10-25 ENCOUNTER — Ambulatory Visit (INDEPENDENT_AMBULATORY_CARE_PROVIDER_SITE_OTHER): Payer: Medicare Other | Admitting: Neurology

## 2019-10-25 ENCOUNTER — Encounter: Payer: Self-pay | Admitting: Neurology

## 2019-10-25 VITALS — BP 117/72 | HR 96 | Temp 97.3°F | Wt 146.5 lb

## 2019-10-25 DIAGNOSIS — R269 Unspecified abnormalities of gait and mobility: Secondary | ICD-10-CM | POA: Diagnosis not present

## 2019-10-25 MED ORDER — CARBIDOPA-LEVODOPA 25-100 MG PO TABS: ORAL_TABLET | ORAL | 3 refills | Status: DC

## 2019-10-25 NOTE — Progress Notes (Signed)
Reason for visit: Gait abnormality, parkinsonism  Alexandria Harvey is an 39 y.o. female  History of present illness:  Alexandria Harvey is a 39 year old right-handed female with a history of a progressive gait disorder.  The patient has an underlying psychiatric disease, she is on Clozaril and Depakote for this.  The patient has had some evidence of a slight increase in ventricular size over the last 5 or 6 years.  For this reason, she was sent for lumbar puncture yesterday and comes in today for a timed walk test.  The patient is getting some physical therapy, she is on Sinemet taking 1 tablet 3 times daily of the 25/100 mg tablets.  The caretaker believes that there has been some benefit with her ability to ambulate, but she did fall about 2 weeks ago.  She does not use a cane or a walker to get around.  Past Medical History:  Diagnosis Date  . Diabetes mellitus without complication ([place])   . 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 Excessive anger   . GERD (gastroesophageal reflux disease)   . Hallucinations   . Hyperlipidemia   . Hypertension   . Mild mental retardation   . Schizophrenia ([place])   . Seizures ([place])   . Vitamin D deficiency     Past Surgical History:  Procedure Laterality Date  . NO PAST SURGERIES      Family History  Problem Relation Age of Onset  . Diabetes Mellitus II Mother   . Cancer Father     Social history:  reports that she has never smoked. She has never used smokeless tobacco. She reports that she does not drink alcohol or use drugs.    Allergies  Allergen Reactions  . Haloperidol Lactate Other (See Comments)    Unknown reaction  . Shrimp [Shellfish Allergy] Hives and Rash  . Strawberry (Diagnostic) Rash    Medications:  Prior to Admission medications   Medication Sig Start Date End Date Taking? Authorizing Provider  acetaminophen (TYLENOL) 650 MG CR tablet Take 650 mg by mouth 3 (three) times daily as needed for pain.   Yes [provider]  atenolol (TENORMIN) 50 MG tablet Take 1 tablet (50 mg total) by mouth daily. Patient taking differently: Take 50 mg by mouth daily with breakfast.  01/25/15  Yes Hildred Priest, MD  atorvastatin (LIPITOR) 80 MG tablet Take 1 tablet (80 mg total) by mouth at bedtime. 01/25/15  Yes Hildred Priest, MD  benztropine (COGENTIN) 1 MG tablet Take 1 mg by mouth 2 (two) times daily.   Yes [provider]  Blood Glucose Monitoring Suppl (ONE TOUCH ULTRA 2) w/Device KIT by Does not apply route. Use to check blood sugar as directed.   Yes [provider]  carbidopa-levodopa (SINEMET IR) 25-100 MG tablet TAKE 1.5 TABLET 3 TIMES DAILY. 10/25/19  Yes Kathrynn Ducking, MD  cloZAPine (CLOZARIL) 100 MG tablet Take 100 mg by mouth 2 (two) times daily.    Yes [provider]  dicyclomine (BENTYL) 10 MG capsule Take 1 capsule (10 mg total) by mouth every 8 (eight) hours as needed for spasms. 06/29/17  Yes Armbruster, Carlota Raspberry, MD  divalproex (DEPAKOTE) 500 MG DR tablet Take 1 tablet (500 mg total) by mouth 3 (three) times daily. Patient taking differently: Take 1,000 mg by mouth See admin instructions. Take 2 tablets (1000 mg) by mouth twice daily - 7:30am and 4pm 01/25/15  Yes Hildred Priest, MD  EPINEPHrine 0.3 mg/0.3 mL IJ  SOAJ injection Inject 0.3 mg into the muscle once as needed for anaphylaxis (severe allergic reaction).    Yes [provider]  ergocalciferol (VITAMIN D2) 1.25 MG (50000 UT) capsule TAKE 1 CAPSULE BY MOUTH TWICE A WEEK ON TUESDAY AND FRIDAY. 10/19/19  Yes [provider]  ferrous sulfate 325 (65 FE) MG tablet Take 1 tablet (325 mg total) by mouth daily with breakfast. 09/15/19 10/25/19 Yes Bonnielee Haff, MD  lisinopril (PRINIVIL,ZESTRIL) 2.5 MG tablet Take 1 tablet (2.5 mg total) by mouth daily. Patient taking differently: Take 2.5 mg by mouth daily with breakfast.  01/25/15  Yes Hildred Priest, MD  LORazepam  (ATIVAN) 0.5 MG tablet Take 0.5 mg by mouth See admin instructions. Take one tablet (0.5 mg) by mouth one hour prior to lab, ekg, dental appointments, etc.   Yes [provider]  lubiprostone (AMITIZA) 24 MCG capsule Take 24 mcg by mouth daily with breakfast.    Yes [provider]  medroxyPROGESTERone (DEPO-PROVERA) 150 MG/ML injection Inject 150 mg into the muscle every 3 (three) months.   Yes [provider]  metFORMIN (GLUCOPHAGE-XR) 500 MG 24 hr tablet Take 1,000 mg by mouth daily with breakfast.   Yes [provider]  nystatin cream (MYCOSTATIN) Apply 1 application topically See admin instructions. Apply topically to genital area twice daily as needed for rash   Yes [provider]  omeprazole (PRILOSEC) 20 MG capsule Take 20 mg by mouth daily with breakfast.    Yes [provider]  polyethylene glycol (MIRALAX / GLYCOLAX) packet Take 17 g by mouth 2 (two) times daily. Patient taking differently: Take 17 g by mouth 2 (two) times daily. Mix in 4-8 oz liquid of choice and drink 06/29/17  Yes Armbruster, Carlota Raspberry, MD  simethicone (MYLICON) 80 MG chewable tablet Chew 80 mg by mouth 2 (two) times daily.   Yes [provider]  Vitamin D, Ergocalciferol, (DRISDOL) 50000 units CAPS capsule Take 50,000 Units by mouth See admin instructions. Take one capsule (50,000 units) by mouth twice weekly - on Tuesday and Friday   Yes [provider]    ROS:  Out of a complete 14 system review of symptoms, the patient complains only of the following symptoms, and all other reviewed systems are negative.  Walking difficulty Fatigue, drowsiness  Blood pressure 117/72, pulse 96, temperature (!) 97.3 F (36.3 C), weight 146 lb 8 oz (66.5 kg).  Physical Exam  General: The patient is alert and cooperative at the time of the examination.  The patient is moderately obese, central obesity.  Skin: No significant peripheral edema is  noted.   Neurologic Exam  Mental status: The patient is alert and oriented x 3 at the time of the examination. The patient has apparent normal recent and remote memory, with an apparently normal attention span and concentration ability.   Cranial nerves: Facial symmetry is present. Speech is normal, no aphasia or dysarthria is noted. Extraocular movements are slow but full in the horizontal plane, the patient has limitation of vertical gaze. Visual fields are full.  Flat affect is noted, masking of the face.  Motor: The patient has good strength in all 4 extremities.  Sensory examination: Soft touch sensation is symmetric on the face, arms, and legs.  Coordination: The patient has good finger-nose-finger and heel-to-shin bilaterally.  Gait and station: The patient has a slightly wide-based gait, the patient has some decreased arm swing with walking.  She is able to walk independently.  Tandem gait  is unsteady.  Romberg is negative.  With the timed walk test, she does 6 laps of 20 feet in 53 seconds.  Reflexes: Deep tendon reflexes are symmetric.   Assessment/Plan:  1.  Gait disturbance  2.  Schizophrenia  3.  Ventriculomegaly  The patient did not respond to the spinal tap, her walking did not improve.  At this point, we will set her up for a DaTscan to look for evidence of a Parkinson's type syndrome.  The Sinemet will be increased to 1.5 tablets 3 times daily.  She will continue her physical therapy and follow-up here in 4 months.  Jill Alexanders MD 10/25/2019 4:41 PM  [facility] [address] [place], [place] [address]  Phone [phone] Fax [phone]

## 2019-10-25 NOTE — Patient Instructions (Signed)
Increase the sinemet to 1.5 of the 25/100 mg tablets three times a day.  Sinemet (carbidopa) may result in confusion or hallucinations, drowsiness, nausea, or dizziness. If any significant side effects are noted, please contact our office. Sinemet may not be well absorbed when taken with high protein meals, if tolerated it is best to take 30-45 minutes before you eat.

## 2019-10-31 NOTE — Telephone Encounter (Signed)
Charlynne Pander, RN from [facility] called to decrease nursing frequency due to pt healing up. Charlynne Pander, RN has asked the frequency be changed to once a week for 3 weeks.  If need be Cara,RN can be reached at [phone]

## 2019-10-31 NOTE — Telephone Encounter (Signed)
Left vm for Alexandria Harvey if she needs orders for nursing care or wound care. Need clarification.Marland Kitchen

## 2019-11-01 NOTE — Telephone Encounter (Signed)
Charlynne Pander, RN from [facility] has called back to inform Katrina, RN there is no need for a call back she states she just  Needs 1 week 3.  Pt's wounds are healed up, she is just keeping an eye on it for next few weeks. No other orders are needed.

## 2019-11-09 ENCOUNTER — Telehealth: Payer: Self-pay | Admitting: Neurology

## 2019-11-09 NOTE — Telephone Encounter (Signed)
[name] ([facility] Resident provider) has called wanting to give Alexandria Harvey the fax # of pt's guardian (Cherri Stinson)[phone] for pt's DAT Scan.  [name] has also asked that if Monday and Tues can be avoided for scheduling it would help.

## 2019-11-15 NOTE — Telephone Encounter (Signed)
Called care giver and left her a message stating we are still waiting on approval for DAT Scan

## 2019-11-24 ENCOUNTER — Telehealth: Payer: Self-pay | Admitting: Neurology

## 2019-11-24 NOTE — Telephone Encounter (Signed)
OT Rick @ [facility] called to report a fall he was told pt had the day before yesterday.  Alexandria Harvey states he was told pt was alone.  Pt was walking and her knees gave way.  Pt has scrapes on right side of face and arm and that is all.  No call back requested

## 2019-11-27 NOTE — Telephone Encounter (Signed)
Events noted

## 2019-11-29 NOTE — Telephone Encounter (Signed)
I could not leave a message because the voice mail was full for care taker there was a option for me to push 5 and I could leave the job number I need to verify fax # [phone] because I keep trying to fax consent and sit keep's failing I need signed consent before I can submit to patient's insurance .

## 2019-11-29 NOTE — Telephone Encounter (Signed)
Still waiting for care giver to send me signed consent .

## 2019-11-30 NOTE — Telephone Encounter (Signed)
Care giver called me back and I replayed I have tried to fax to [phone] six times with failed conformations . Caregiver gave me another fax number [phone] received ok conformation.   Waiting on signed copy back now .

## 2019-12-07 NOTE — Telephone Encounter (Signed)
Still waiting on returned fax for Dat scan consent.

## 2019-12-25 NOTE — Telephone Encounter (Signed)
Processing I have sent for approval of DAT scan .

## 2019-12-26 NOTE — Telephone Encounter (Signed)
I have sent to [facility] to be scheduled .

## 2020-01-10 ENCOUNTER — Telehealth: Payer: Self-pay | Admitting: Neurology

## 2020-01-10 DIAGNOSIS — R269 Unspecified abnormalities of gait and mobility: Secondary | ICD-10-CM

## 2020-01-10 NOTE — Telephone Encounter (Signed)
Contact number for Harriett Sine OT is [phone].

## 2020-01-10 NOTE — Telephone Encounter (Signed)
Harriett Sine the OT therapist from [facility] called stating that they are needing a prescription for a Transfer Tub Bench and have it sent to [facility]. Please advise.

## 2020-01-10 NOTE — Telephone Encounter (Signed)
Harriett Sine OT called Sheilah Mins back to inform that waiting until Monday is fine.  She can still be reached at [phone] if a call back is needed

## 2020-01-10 NOTE — Telephone Encounter (Signed)
Left vm for Harriett Sine to call back about OT order. I stated Dr Anne Hahn will be here on Monday because he is part time.I ask if pt needs urgently it can be sent to work in [place].

## 2020-01-15 NOTE — Addendum Note (Signed)
Addended by: York Spaniel on: 01/15/2020 07:51 AM   Modules accepted: Orders

## 2020-01-15 NOTE — Telephone Encounter (Signed)
I will write the prescription. 

## 2020-01-15 NOTE — Telephone Encounter (Signed)
Left vm for Alexandria Harvey to call back that the DME order for transfer tube bench is signed and ready. I ask them for fax number at [facility].

## 2020-01-18 ENCOUNTER — Encounter (HOSPITAL_COMMUNITY): Payer: Medicare Other | Attending: Neurology

## 2020-01-18 ENCOUNTER — Encounter (HOSPITAL_COMMUNITY): Admission: RE | Admit: 2020-01-18 | Payer: Medicare Other | Source: Ambulatory Visit

## 2020-01-18 ENCOUNTER — Encounter (HOSPITAL_COMMUNITY): Payer: Self-pay

## 2020-01-23 NOTE — Telephone Encounter (Signed)
I had to [facility] to get fax number for DME orders. I was given wrong fax number. I had to call again was given fax number of  [phone]. Transfer tube bench fax to [phone]. Fax receive and confirmed.

## 2020-02-26 ENCOUNTER — Telehealth: Payer: Self-pay | Admitting: Neurology

## 2020-02-27 ENCOUNTER — Ambulatory Visit: Payer: Medicare Other | Admitting: Neurology

## 2020-02-27 NOTE — Telephone Encounter (Signed)
error 

## 2020-03-06 ENCOUNTER — Other Ambulatory Visit: Payer: Self-pay

## 2020-03-06 ENCOUNTER — Encounter (HOSPITAL_COMMUNITY)
Admission: RE | Admit: 2020-03-06 | Discharge: 2020-03-06 | Disposition: A | Discharge status: Home / Self Care | Payer: Medicare Other | Source: Ambulatory Visit | Attending: Neurology | Admitting: Neurology

## 2020-03-06 DIAGNOSIS — R269 Unspecified abnormalities of gait and mobility: Secondary | ICD-10-CM | POA: Insufficient documentation

## 2020-03-06 MED ORDER — IODINE STRONG (LUGOLS) 5 % PO SOLN: 0.2000 mL | Freq: Three times a day (TID) | ORAL | Status: DC

## 2020-03-06 MED ORDER — IOFLUPANE I 123 185 MBQ/2.5ML IV SOLN
4.4000 | Freq: Once | INTRAVENOUS | Status: AC | PRN
  Administered 2020-03-06: 4.4 via INTRAVENOUS
  Filled 2020-03-06: qty 5

## 2020-03-06 MED ORDER — IODINE STRONG (LUGOLS) 5 % PO SOLN: 0.8000 mL | Freq: Once | ORAL | Status: DC

## 2020-03-06 MED ORDER — IODINE STRONG (LUGOLS) 5 % PO SOLN
ORAL | Status: AC
  Filled 2020-03-06: qty 1

## 2020-03-11 ENCOUNTER — Telehealth: Payer: Self-pay | Admitting: Neurology

## 2020-03-11 MED ORDER — CARBIDOPA-LEVODOPA 25-100 MG PO TABS: ORAL_TABLET | ORAL | 0 refills | Status: DC

## 2020-03-11 NOTE — Telephone Encounter (Signed)
  I called and talk with her caretaker.  The DaTscan was normal, no evidence of Parkinson's disease.  The patient will be tapered off of the Sinemet, she will follow-up for his next visit in about 2 weeks.  DAT scan 03/08/20:  IMPRESSION: Normal Iofl[place]ane scan.

## 2020-03-28 ENCOUNTER — Other Ambulatory Visit: Payer: Self-pay

## 2020-03-28 ENCOUNTER — Encounter: Payer: Self-pay | Admitting: Neurology

## 2020-03-28 ENCOUNTER — Ambulatory Visit (INDEPENDENT_AMBULATORY_CARE_PROVIDER_SITE_OTHER): Payer: Medicare Other | Admitting: Neurology

## 2020-03-28 VITALS — BP 118/79 | HR 97 | Ht 62.5 in | Wt 154.0 lb

## 2020-03-28 DIAGNOSIS — R5382 Chronic fatigue, unspecified: Secondary | ICD-10-CM | POA: Diagnosis not present

## 2020-03-28 DIAGNOSIS — R269 Unspecified abnormalities of gait and mobility: Secondary | ICD-10-CM | POA: Diagnosis not present

## 2020-03-28 NOTE — Progress Notes (Signed)
Reason for visit: Gait disorder  Alexandria Harvey is an 39 y.o. female  History of present illness:  Alexandria Harvey is a 39 year old right-handed female with a history of schizophrenia.  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  A DaTscan however was done and was unremarkable.  The patient is being tapered off of her Sinemet.  She comes in with her caretaker today who indicates that she has had 1 fall since last seen, overall she is doing well with the walking, her walking has been stable.  There has been some question of ventriculomegaly and normal pressure hydrocephalus, the patient did not respond to a lumbar puncture with her walking.  The patient has diabetes, she does not report numbness in the extremities, she has if anything brisk reflexes throughout.  She does report some urinary urgency at times.  Overall, her clinical condition has been relatively stable.  The exact etiology of her gait problem has not yet been delineated.  Past Medical History:  Diagnosis Date  . Diabetes mellitus without complication ([place])   . 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 Excessive anger   . GERD (gastroesophageal reflux disease)   . Hallucinations   . Hyperlipidemia   . Hypertension   . Mild mental retardation   . Schizophrenia ([place])   . Seizures ([place])   . Vitamin D deficiency     Past Surgical History:  Procedure Laterality Date  . NO PAST SURGERIES      Family History  Problem Relation Age of Onset  . Diabetes Mellitus II Mother   . Cancer Father     Social history:  reports that she has never smoked. She has never used smokeless tobacco. She reports that she does not drink alcohol and does not use drugs.    Allergies  Allergen Reactions  . Haloperidol Lactate Other (See Comments)    Unknown reaction  . Shrimp [Shellfish Allergy] Hives and Rash  . Strawberry (Diagnostic) Rash    Medications:  Prior to Admission  medications   Medication Sig Start Date End Date Taking? Authorizing Provider  acetaminophen (TYLENOL) 650 MG CR tablet Take 650 mg by mouth 3 (three) times daily as needed for pain.    [provider]  atenolol (TENORMIN) 50 MG tablet Take 1 tablet (50 mg total) by mouth daily. Patient taking differently: Take 50 mg by mouth daily with breakfast.  01/25/15   Hildred Priest, MD  atorvastatin (LIPITOR) 80 MG tablet Take 1 tablet (80 mg total) by mouth at bedtime. 01/25/15   Hildred Priest, MD  benztropine (COGENTIN) 1 MG tablet Take 1 mg by mouth 2 (two) times daily.    [provider]  Blood Glucose Monitoring Suppl (ONE TOUCH ULTRA 2) w/Device KIT by Does not apply route. Use to check blood sugar as directed.    [provider]  carbidopa-levodopa (SINEMET IR) 25-100 MG tablet 1 tablet 3 times daily for 2 weeks then take 1/2 tablet 3 times daily for 2 weeks and then stop 03/11/20   Kathrynn Ducking, MD  cloZAPine (CLOZARIL) 100 MG tablet Take 100 mg by mouth 2 (two) times daily.     [provider]  dicyclomine (BENTYL) 10 MG capsule Take 1 capsule (10 mg total) by mouth every 8 (eight) hours as needed for spasms. 06/29/17   Armbruster, Carlota Raspberry, MD  divalproex (DEPAKOTE) 500 MG DR tablet Take 1 tablet (500 mg total) by  mouth 3 (three) times daily. Patient taking differently: Take 1,000 mg by mouth See admin instructions. Take 2 tablets (1000 mg) by mouth twice daily - 7:30am and 4pm 01/25/15   Hildred Priest, MD  EPINEPHrine 0.3 mg/0.3 mL IJ SOAJ injection Inject 0.3 mg into the muscle once as needed for anaphylaxis (severe allergic reaction).     [provider]  ergocalciferol (VITAMIN D2) 1.25 MG (50000 UT) capsule TAKE 1 CAPSULE BY MOUTH TWICE A WEEK ON TUESDAY AND FRIDAY. 10/19/19   [provider]  ferrous sulfate 325 (65 FE) MG tablet Take 1 tablet (325 mg total) by mouth daily with breakfast. 09/15/19 10/25/19   Bonnielee Haff, MD  lisinopril (PRINIVIL,ZESTRIL) 2.5 MG tablet Take 1 tablet (2.5 mg total) by mouth daily. Patient taking differently: Take 2.5 mg by mouth daily with breakfast.  01/25/15   Hildred Priest, MD  LORazepam (ATIVAN) 0.5 MG tablet Take 0.5 mg by mouth See admin instructions. Take one tablet (0.5 mg) by mouth one hour prior to lab, ekg, dental appointments, etc.    [provider]  lubiprostone (AMITIZA) 24 MCG capsule Take 24 mcg by mouth daily with breakfast.     [provider]  medroxyPROGESTERone (DEPO-PROVERA) 150 MG/ML injection Inject 150 mg into the muscle every 3 (three) months.    [provider]  metFORMIN (GLUCOPHAGE-XR) 500 MG 24 hr tablet Take 1,000 mg by mouth daily with breakfast.    [provider]  nystatin cream (MYCOSTATIN) Apply 1 application topically See admin instructions. Apply topically to genital area twice daily as needed for rash    [provider]  omeprazole (PRILOSEC) 20 MG capsule Take 20 mg by mouth daily with breakfast.     [provider]  polyethylene glycol (MIRALAX / GLYCOLAX) packet Take 17 g by mouth 2 (two) times daily. Patient taking differently: Take 17 g by mouth 2 (two) times daily. Mix in 4-8 oz liquid of choice and drink 06/29/17   Armbruster, Carlota Raspberry, MD  simethicone (MYLICON) 80 MG chewable tablet Chew 80 mg by mouth 2 (two) times daily.    [provider]  Vitamin D, Ergocalciferol, (DRISDOL) 50000 units CAPS capsule Take 50,000 Units by mouth See admin instructions. Take one capsule (50,000 units) by mouth twice weekly - on Tuesday and Friday    [provider]    ROS:  Out of a complete 14 system review of symptoms, the patient complains only of the following symptoms, and all other reviewed systems are negative.  Walking problem Urinary urgency  Blood pressure 118/79, pulse 97, height 5' 2.5" (1.588 m), weight 154 lb (69.9 kg).   Blood  pressure, right arm, standing is 114/68.  Physical Exam  General: The patient is alert and cooperative at the time of the examination.  Masking of the face is seen.  The patient has a flat affect.  Skin: No significant peripheral edema is noted.   Neurologic Exam  Mental status: The patient is alert and oriented x 3 at the time of the examination. The patient has apparent normal recent and remote memory, with an apparently normal attention span and concentration ability.   Cranial nerves: Facial symmetry is present. Speech is normal, no aphasia or dysarthria is noted. Extraocular movements are full. Visual fields are full.  Motor: The patient has good strength in all 4 extremities, with exception of some weakness with grip bilaterally.  Sensory examination: Soft touch sensation is symmetric on the face, arms, and legs.  Coordination: The patient has good finger-nose-finger and heel-to-shin bilaterally.  Gait and station: The patient has a slightly stooped posture, decreased arm swing, the patient has a minimally wide-based gait, takes some shuffling steps.  She is able to walk independently.  Tandem gait is not attempted.  Romberg is negative.  Reflexes: Deep tendon reflexes are symmetric, but are brisk throughout including ankle jerks.   DATSCAN 03/08/20:  IMPRESSION: Normal Ioflupane scan.   Assessment/Plan:  1.  History of schizophrenia  2.  Gait disorder  The patient does have some mild ventriculomegaly on MRI, she still could have a normal pressure hydrocephalus syndrome, it is also possible that even with Clozaril she could be having some extrapyramidal side effects.  The patient appears to be somewhat hyperreflexic throughout, we will check MRI of the cervical spine.  Further blood work will be done today.  If the patient appears to decline in her ability to walk, we may consider referral to [facility] for a possible lumbar drain and evaluation of her 3 days to determine whether  a VP shunt may be of benefit.  We will taper her off of the Sinemet, she is on one half of the 25/100 mg tablets 3 times daily, we will continue this for another 2 weeks and then stop the medication.  She will follow-up here in 5 months.  The caretaker will call our office if there is decline in her ability to ambulate.  Jill Alexanders MD 03/28/2020 8:49 AM  [facility] [address] [place], [place] [address]  Phone [phone] Fax [phone]

## 2020-03-28 NOTE — Patient Instructions (Signed)
Continue Sinemet 25/100 mg tablets, 1/2 tablet three times a day for another 2 weeks, then stop.

## 2020-03-30 ENCOUNTER — Telehealth: Payer: Self-pay | Admitting: Neurology

## 2020-03-30 LAB — HTLV I+II ANTIBODIES, (EIA), BLD: HTLV I/II Ab: NEGATIVE

## 2020-03-30 LAB — COPPER, SERUM: Copper: 107 ug/dL (ref 80–158)

## 2020-03-30 LAB — HIV ANTIBODY (ROUTINE TESTING W REFLEX): HIV Screen 4th Generation wRfx: NONREACTIVE

## 2020-03-30 NOTE — Telephone Encounter (Signed)
Medicare/medicaid order sent to GI. No auth they will reach out to the patient to schedule.  °

## 2020-04-09 ENCOUNTER — Ambulatory Visit
Admission: RE | Admit: 2020-04-09 | Discharge: 2020-04-09 | Disposition: A | Discharge status: Home / Self Care | Payer: Medicare Other | Source: Ambulatory Visit | Attending: Neurology | Admitting: Neurology

## 2020-04-09 ENCOUNTER — Other Ambulatory Visit: Payer: Self-pay

## 2020-04-09 DIAGNOSIS — R269 Unspecified abnormalities of gait and mobility: Secondary | ICD-10-CM

## 2020-04-11 ENCOUNTER — Telehealth: Payer: Self-pay | Admitting: Neurology

## 2020-04-11 NOTE — Telephone Encounter (Signed)
  I called the patient.  I talked with the caretaker.  MRI of the cervical spine does not show any evidence of spinal cord compression that would cause hyperreflexia and a gait disturbance.  We will follow the gait disturbance over time, they will let me know if there is a change in the ability to walk.  MRI cervical 04/11/20:  IMPRESSION:   MRI cervical spine (without) demonstrating: - At C5-6, C6-7: small disc protrusions with mild spinal stenosis and no foraminal stenosis. No cord signal abnormalities.

## 2022-07-23 ENCOUNTER — Ambulatory Visit
Admission: EM | Admit: 2022-07-23 | Discharge: 2022-07-23 | Disposition: A | Discharge status: Home / Self Care | Payer: Medicare Other | Attending: Physician Assistant | Admitting: Physician Assistant

## 2022-07-23 ENCOUNTER — Encounter: Payer: Self-pay | Admitting: Emergency Medicine

## 2022-07-23 ENCOUNTER — Ambulatory Visit (INDEPENDENT_AMBULATORY_CARE_PROVIDER_SITE_OTHER): Payer: Medicare Other

## 2022-07-23 ENCOUNTER — Other Ambulatory Visit: Payer: Self-pay

## 2022-07-23 DIAGNOSIS — W19XXXA Unspecified fall, initial encounter: Secondary | ICD-10-CM | POA: Diagnosis not present

## 2022-07-23 DIAGNOSIS — Z1152 Encounter for screening for COVID-19: Secondary | ICD-10-CM | POA: Diagnosis present

## 2022-07-23 DIAGNOSIS — S4991XA Unspecified injury of right shoulder and upper arm, initial encounter: Secondary | ICD-10-CM | POA: Diagnosis present

## 2022-07-23 DIAGNOSIS — J069 Acute upper respiratory infection, unspecified: Secondary | ICD-10-CM | POA: Diagnosis present

## 2022-07-23 DIAGNOSIS — M25511 Pain in right shoulder: Secondary | ICD-10-CM

## 2022-07-23 LAB — RESP PANEL BY RT-PCR (FLU A&B, COVID) ARPGX2
Influenza A by PCR: NEGATIVE
Influenza B by PCR: NEGATIVE
SARS Coronavirus 2 by RT PCR: NEGATIVE

## 2022-07-23 NOTE — ED Triage Notes (Signed)
Pt here for sore throat per care giver x 2 days; pt also c/o right arm pain from fall last week

## 2022-07-23 NOTE — Discharge Instructions (Addendum)
  Please make appointment with ortho in the next week.

## 2022-07-23 NOTE — ED Provider Notes (Signed)
[facility]    CSN: [id number] Arrival date & time: 07/23/22  1028      History   Chief Complaint Chief Complaint  Patient presents with   Sore Throat   Arm Pain    HPI Alexandria Harvey is a 41 y.o. female.   Patient here today for evaluation of sore throat that started 2 days ago.  She is here today with caregiver who provides history.  Patient resides in group home.  She has had some congestion.  It is unclear whether she has had fever but she has taken DayQuil with mild relief of symptoms.  Caregiver also reports that she has had pain in her right upper arm, AC for the last several months after a fall in the shower at least 4 months ago. She has complained of pain with certain activities but caregiver reports she seems to be ok with performing "desirable" activities. She does not report any numbness.   The history is provided by the patient.  Sore Throat Pertinent negatives include no shortness of breath.  Arm Pain Pertinent negatives include no shortness of breath.    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   Excessive anger    GERD (gastroesophageal reflux disease)    Hallucinations    Hyperlipidemia    Hypertension    Mild mental retardation    Schizophrenia ([place])    Seizures ([place])    Vitamin D deficiency     Patient Active Problem List   Diagnosis Date Noted   Pressure injury of skin 09/10/2019   Acute respiratory failure with hypoxia ([place]) 09/10/2019   COVID-19 virus infection 09/09/2019   Elevated AST (SGOT) 09/09/2019   Thrombocytopenia ([place]) 09/09/2019   Positive D dimer 09/09/2019   Encephalopathy acute 09/09/2019   Sepsis due to pneumonia ([place]) 09/08/2019   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      Home Medications    Prior to Admission medications   Medication Sig Start Date End Date Taking? Authorizing Provider  acetaminophen (TYLENOL) 650 MG CR tablet Take 650 mg by mouth 3 (three) times daily as needed for pain.    [provider]  atenolol (TENORMIN) 50 MG tablet Take 1 tablet (50 mg total) by mouth daily. Patient taking differently: Take 50 mg by mouth daily with breakfast.  01/25/15   Hildred Priest, MD  atorvastatin (LIPITOR) 80 MG tablet Take 1 tablet (80 mg total) by mouth at bedtime. 01/25/15   Hildred Priest, MD  benztropine (COGENTIN) 1 MG tablet Take 1 mg by mouth 2 (two) times daily.    [provider]  Blood Glucose Monitoring Suppl (ONE TOUCH ULTRA 2) w/Device KIT by Does not apply route. Use to check blood sugar as directed.    [provider]  cloZAPine (CLOZARIL) 100 MG tablet Take 100 mg by mouth 2 (two) times daily.  [provider]  dicyclomine (BENTYL) 10 MG capsule Take 1 capsule (10 mg total) by mouth every 8 (eight) hours as needed for spasms. 06/29/17   Armbruster, Carlota Raspberry, MD  divalproex (DEPAKOTE) 500 MG DR tablet Take 1 tablet (500 mg total) by mouth 3 (three) times daily. Patient taking differently: Take 1,000 mg by mouth See admin instructions. Take 2 tablets (1000 mg) by mouth twice daily - 7:30am and 4pm 01/25/15   Hildred Priest, MD  EPINEPHrine 0.3 mg/0.3 mL IJ SOAJ injection Inject 0.3 mg into the muscle once as needed for anaphylaxis (severe allergic reaction).     [provider]   ergocalciferol (VITAMIN D2) 1.25 MG (50000 UT) capsule TAKE 1 CAPSULE BY MOUTH TWICE A WEEK ON TUESDAY AND FRIDAY. 10/19/19   [provider]  ferrous sulfate 325 (65 FE) MG tablet Take 1 tablet (325 mg total) by mouth daily with breakfast. 09/15/19 10/25/19  Bonnielee Haff, MD  lisinopril (PRINIVIL,ZESTRIL) 2.5 MG tablet Take 1 tablet (2.5 mg total) by mouth daily. Patient taking differently: Take 2.5 mg by mouth daily with breakfast.  01/25/15   Hildred Priest, MD  LORazepam (ATIVAN) 0.5 MG tablet Take 0.5 mg by mouth See admin instructions. Take one tablet (0.5 mg) by mouth one hour prior to lab, ekg, dental appointments, etc.    [provider]  lubiprostone (AMITIZA) 24 MCG capsule Take 24 mcg by mouth daily with breakfast.     [provider]  medroxyPROGESTERone (DEPO-PROVERA) 150 MG/ML injection Inject 150 mg into the muscle every 3 (three) months.    [provider]  metFORMIN (GLUCOPHAGE-XR) 500 MG 24 hr tablet Take 1,000 mg by mouth daily with breakfast.    [provider]  nystatin cream (MYCOSTATIN) Apply 1 application topically See admin instructions. Apply topically to genital area twice daily as needed for rash    [provider]  omeprazole (PRILOSEC) 20 MG capsule Take 20 mg by mouth daily with breakfast.     [provider]  polyethylene glycol (MIRALAX / GLYCOLAX) packet Take 17 g by mouth 2 (two) times daily. Patient taking differently: Take 17 g by mouth 2 (two) times daily. Mix in 4-8 oz liquid of choice and drink 06/29/17   Armbruster, Carlota Raspberry, MD  simethicone (MYLICON) 80 MG chewable tablet Chew 80 mg by mouth 2 (two) times daily.    [provider]  Vitamin D, Ergocalciferol, (DRISDOL) 50000 units CAPS capsule Take 50,000 Units by mouth See admin instructions. Take one capsule (50,000 units) by mouth twice weekly - on Tuesday and Friday    [provider]    Family History Family  History  Problem Relation Age of Onset   Diabetes Mellitus II Mother    Cancer Father     Social History Social History   Tobacco Use   Smoking status: Never   Smokeless tobacco: Never  Substance Use Topics   Alcohol use: No   Drug use: No     Allergies   Haloperidol lactate, Shrimp [shellfish allergy], and Strawberry (diagnostic)   Review of Systems Review of Systems  Constitutional:  Negative for chills and fever.  HENT:  Positive for congestion and sore throat.   Eyes:  Negative for discharge and redness.  Respiratory:  Positive for cough. Negative for shortness of breath.   Gastrointestinal:  Negative for diarrhea, nausea and vomiting.  Musculoskeletal:  Positive for arthralgias.     Physical Exam Triage Vital Signs ED Triage Vitals [07/23/22 1141]  Enc Vitals Group     BP 128/88     Pulse Rate 97     Resp 18     Temp 97.9 F (36.6 C)     Temp Source Oral     SpO2 98 %     Weight      Height      Head Circumference      Peak Flow      Pain Score 3     Pain Loc      Pain Edu?      Excl. in [employer]?    No data found.  Updated Vital Signs BP 128/88 (BP Location: Left Arm)   Pulse 97   Temp 97.9 F (36.6 C) (Oral)   Resp 18   SpO2 98%      Physical Exam Vitals and nursing note reviewed.  Constitutional:      General: She is not in acute distress.    Appearance: Normal appearance. She is not ill-appearing.  HENT:     Head: Normocephalic and atraumatic.     Nose: Congestion (mild) present. No rhinorrhea.     Mouth/Throat:     Mouth: Mucous membranes are moist.     Pharynx: Oropharynx is clear. No oropharyngeal exudate or posterior oropharyngeal erythema.  Eyes:     Conjunctiva/sclera: Conjunctivae normal.  Cardiovascular:     Rate and Rhythm: Normal rate and regular rhythm.  Pulmonary:     Effort: Pulmonary effort is normal. No respiratory distress.     Breath sounds: Normal breath sounds. No wheezing, rhonchi or rales.  Musculoskeletal:      Comments: Mildly decreased ROM of right shoulder in all directions due to pain. Patient reports TTP to [facility] joint.   Neurological:     Mental Status: She is alert.     Comments: Grip strength 5/5 bilaterally  Psychiatric:        Mood and Affect: Mood normal.        Behavior: Behavior normal.      UC Treatments / Results  Labs (all labs ordered are listed, but only abnormal results are displayed) Labs Reviewed  RESP PANEL BY RT-PCR (FLU A&B, COVID) [id number]    EKG   Radiology DG Shoulder Right  Result Date: 07/23/2022 CLINICAL DATA:  Right shoulder pain after fall. EXAM: RIGHT SHOULDER - 2+ VIEW COMPARISON:  None Available. FINDINGS: There is diffuse decreased bone mineralization. Mild degenerative irregularity of the superior glenoid articular surface. One bone width (approximately 1.7 cm) superior displacement of the right clavicular head with respect to the right acromion. There appears to be chronic osseous fusion across least a portion of the joint, suggesting this is a remote injury. Moderate to high-grade acromioclavicular peripheral degenerative osteophytes. Moderate calcification that appears to be outlining the superior and inferior glenohumeral joint capsule and mildly extending along the border of the proximal medial humeral diaphysis. A portion of this calcification may represent chronic calcific tendinosis of the superior rotator cuff. No acute fracture or dislocation. The visualized portion of the right lung is unremarkable. IMPRESSION: 1. Remote type III [place] classification acromioclavicular joint injury with superior elevation of the clavicular head and partial osseous fusion across the joint. 2. Moderate chronic calcification that appears to be outlining portions of the superior and inferior glenohumeral joint capsule. A portion of this calcification may represent chronic calcific tendinosis of the superior rotator cuff. 3. No acute fracture is seen. Electronically Signed    By: Yvonne Kendall M.D.   On:  07/23/2022 12:21    Procedures Procedures (including critical care time)  Medications Ordered in UC Medications - No data to display  Initial Impression / Assessment and Plan / UC Course  I have reviewed the triage vital signs and the nursing notes.  Pertinent labs & imaging results that were available during my care of the patient were reviewed by me and considered in my medical decision making (see chart for details).    Xray concerning for possible AC injury-- recommended follow up with ortho within the next week. Caregiver expresses understanding and requests I not discuss possible injury too loud and states if patient hears this she may stop feeding herself, etc. She states she will call to make appointment with ortho on her way home from today's visit.   Will screen for covid and flu given reported URI symptoms. Recommended continued symptomatic treatment while awaiting results and follow up with any further concerns.   Final Clinical Impressions(s) / UC Diagnoses   Final diagnoses:  Injury of right shoulder, initial encounter  Acute upper respiratory infection  Encounter for screening for COVID-19  Acute respiratory failure with hypoxia [facility])     Discharge Instructions       Please make appointment with ortho in the next week.         ED Prescriptions   None    PDMP not reviewed this encounter.   Francene Finders, PA-C 07/23/22 1320

## 2024-03-22 ENCOUNTER — Ambulatory Visit
Admission: EM | Admit: 2024-03-22 | Discharge: 2024-03-22 | Disposition: A | Discharge status: Home / Self Care | Attending: Family Medicine | Admitting: Family Medicine

## 2024-03-22 DIAGNOSIS — S0181XA Laceration without foreign body of other part of head, initial encounter: Secondary | ICD-10-CM | POA: Diagnosis not present

## 2024-03-22 NOTE — ED Triage Notes (Signed)
 Pt states she was going outside and fell when going down the stairs and thinks her glasses may have cut her face. Now pt has a laceration on right eyebrow and cheek.

## 2024-03-22 NOTE — Discharge Instructions (Addendum)
 If not allergic, you may use over the counter ibuprofen or acetaminophen as needed.

## 2024-03-23 NOTE — ED Provider Notes (Addendum)
 [facility]   [id number] 03/22/24 Arrival Time: 1128  ASSESSMENT & PLAN:  1. Facial laceration, initial encounter     Procedure: Laceration Repair Verbal consent obtained. Patient provided with risks and alternatives to the procedure. Wound copiously irrigated with NS then cleansed with betadine. Local anesthesia: Lidocaine 1% without epinephrine. Wound carefully explored. No foreign body or nonviable tissue were noted. Using clean technique, 3 interrupted 5-0 fast absorbing plain gut sutures were placed to reapproximate the wound. Procedure tolerated well. No complications. Minimal bleeding. Advised to look for and return for any signs of infection such as redness, swelling, discharge, or worsening pain.    Discharge Instructions      If not allergic, you may use over the counter ibuprofen  or acetaminophen  as needed.    Reviewed expectations re: course of current medical issues. Questions answered. Outlined signs and symptoms indicating need for more acute intervention. Patient verbalized understanding. After Visit Summary given.   SUBJECTIVE:  Alexandria Harvey is a 43 y.o. female who presents with a laceration of face just above R eyebrow; fall today; eyeglasses caused laceration. Denies eye pain or visual changes. Bleeding controlled. Denies LOC.  Feels Td is UTD.   OBJECTIVE:  Vitals:   03/22/24 1143  BP: 116/81  Pulse: 92  Resp: 16  Temp: 98 F (36.7 C)  TempSrc: Oral  SpO2: 96%     General appearance: alert; no distress Eye: PERRLA; EOMI Skin: curved laceration of face just above R eyebrow; size: approx 1.5 cm; clean wound edges, no foreign bodies; with mild bleeding Psychological: alert and cooperative; normal mood and affect    Labs Reviewed - No data to display  No results found.  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   Excessive anger    GERD (gastroesophageal reflux disease)    Hallucinations    Hyperlipidemia    Hypertension    Mild mental retardation    Schizophrenia (HCC)    Seizures (HCC)    Vitamin D  deficiency    Social History   Socioeconomic History   Marital status: Single    Spouse name: Not on file   Number of children: Not on file   Years of education: Not on file   Highest education level: Not on file  Occupational History   Not on file  Tobacco Use   Smoking status: Never   Smokeless tobacco: Never  Substance and Sexual Activity   Alcohol use: No   Drug use: No   Sexual activity: Never  Other Topics Concern   Not on file  Social History Narrative   Lives at [name] [place]   Cherri Stinton legal guardian     Right handed    Social Drivers of Health   Financial Resource Strain: High Risk (02/29/2024)   Received from [facility]   Overall Financial Resource Strain (CARDIA)    Difficulty of Paying Living Expenses: Very hard  Food Insecurity: No Food Insecurity (02/29/2024)   Received from [facility]   Hunger Vital Sign    Within the past 12 months, you worried that your food would run out before you got the money to buy more.: Never true    Within the past 12  months, the food you bought just didn't last and you didn't have money to get more.: Never true  Transportation Needs: No Transportation Needs (02/29/2024)   Received from [facility] - Transportation    Lack of Transportation (Medical): No    Lack of Transportation (Non-Medical): No  Physical Activity: Unknown (02/29/2024)   Received from [facility]   Exercise Vital Sign    On average, how many days per week do you engage in moderate to strenuous exercise (like a brisk walk)?: 0 days    Minutes of Exercise per Session: Not on  file  Stress: No Stress Concern Present (02/29/2024)   Received from [facility] - Occupational Stress Questionnaire    Feeling of Stress : Not at all  Social Connections: Moderately Integrated (02/29/2024)   Received from [facility]   Social Network    How would you rate your social network (family, work, friends)?: Adequate participation with social networks          [place], [place], MD 03/23/24 [id number]    Rolinda Redell, MD 03/23/24 [phone]

## 2024-05-12 ENCOUNTER — Ambulatory Visit
Admission: EM | Admit: 2024-05-12 | Discharge: 2024-05-12 | Disposition: A | Discharge status: Home / Self Care | Attending: Family Medicine | Admitting: Family Medicine

## 2024-05-12 ENCOUNTER — Ambulatory Visit: Payer: Self-pay | Admitting: Nurse Practitioner

## 2024-05-12 ENCOUNTER — Ambulatory Visit (INDEPENDENT_AMBULATORY_CARE_PROVIDER_SITE_OTHER)

## 2024-05-12 ENCOUNTER — Other Ambulatory Visit: Payer: Self-pay

## 2024-05-12 DIAGNOSIS — S92302P Fracture of unspecified metatarsal bone(s), left foot, subsequent encounter for fracture with malunion: Secondary | ICD-10-CM

## 2024-05-12 DIAGNOSIS — M79672 Pain in left foot: Secondary | ICD-10-CM

## 2024-05-12 DIAGNOSIS — M25572 Pain in left ankle and joints of left foot: Secondary | ICD-10-CM

## 2024-05-12 DIAGNOSIS — S92242A Displaced fracture of medial cuneiform of left foot, initial encounter for closed fracture: Secondary | ICD-10-CM | POA: Diagnosis not present

## 2024-05-12 DIAGNOSIS — S82302A Unspecified fracture of lower end of left tibia, initial encounter for closed fracture: Secondary | ICD-10-CM

## 2024-05-12 NOTE — Discharge Instructions (Signed)
 Keep the left foot in the boot until you are seen by orthopedics.  You can either follow-up with your orthopedist at [facility] or follow-up with the below listed orthopedist with [facility] health.  You may elevate and ice as needed.  Use over-the-counter Tylenol  or ibuprofen  as needed for pain.  Please go to the ER for any worsening symptoms that occur prior to seeing orthopedics, this includes but is not limited to uncontrolled pain or swelling, persistent numbness or tingling, or any new concerns that arise.  Hope you feel better soon!

## 2024-05-12 NOTE — ED Provider Notes (Signed)
 [facility]    CSN: [id number] Arrival date & time: 05/12/24  1025      History   Chief Complaint Chief Complaint  Patient presents with   Ankle Injury    HPI Alexandria Harvey is a 43 y.o. female presents for foot and ankle pain.  Patient is accompanied by her caregiver.  Caregiver patient reports last night she missed the last step on the stairs rolling her left foot and ankle.  This was witnessed and denies head injury or LOC.  Since then reports swelling of the ankle and foot with pain with weightbearing.  No numbness or tingling.  Patient states she has broken the ankle in the past but denies surgery.  Has been using elevation to treat symptoms.  No other concerns at this time.   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   Excessive anger    GERD (gastroesophageal reflux disease)    Hallucinations    Hyperlipidemia    Hypertension    Mild mental retardation    Schizophrenia (HCC)    Seizures (HCC)    Vitamin D  deficiency     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      Home Medications    Prior to Admission medications   Medication Sig Start Date End Date Taking? Authorizing Provider  acetaminophen  (TYLENOL ) 650 MG CR tablet Take 650 mg by mouth 3 (three) times daily as needed for pain.    [provider]  atenolol  (TENORMIN ) 50 MG tablet Take 1 tablet (50 mg total) by mouth daily. Patient taking differently: Take 50 mg by mouth daily with breakfast.  01/25/15   Ragena Odor, MD  atorvastatin  (LIPITOR ) 80 MG tablet Take 1 tablet (80 mg total) by mouth at bedtime. 01/25/15   Ragena Odor, MD  benztropine  (COGENTIN ) 1 MG tablet Take 1 mg by mouth 2 (two) times daily.    [provider]  Blood Glucose Monitoring Suppl (ONE TOUCH ULTRA 2) w/Device KIT by Does not apply route. Use to check blood sugar as directed.    [provider]  cloZAPine  (CLOZARIL ) 100 MG tablet Take 100 mg by mouth 2 (two) times daily.     [provider]  dicyclomine  (BENTYL ) 10 MG capsule Take 1 capsule (10 mg total) by mouth every 8 (eight) hours as needed for spasms. 06/29/17   Armbruster, Elspeth SQUIBB, MD  divalproex  (DEPAKOTE ) 500 MG DR tablet Take 1 tablet (500 mg total) by mouth 3 (three)  times daily. Patient taking differently: Take 1,000 mg by mouth See admin instructions. Take 2 tablets (1000 mg) by mouth twice daily - 7:30am and 4pm 01/25/15   Ragena Odor, MD  EPINEPHrine 0.3 mg/0.3 mL IJ SOAJ injection Inject 0.3 mg into the muscle once as needed for anaphylaxis (severe allergic reaction).     [provider]  ergocalciferol  (VITAMIN D2) 1.25 MG (50000 UT) capsule TAKE 1 CAPSULE BY MOUTH TWICE A WEEK ON TUESDAY AND FRIDAY. 10/19/19   [provider]  ferrous sulfate  325 (65 FE) MG tablet Take 1 tablet (325 mg total) by mouth daily with breakfast. 09/15/19 10/25/19  Verdene Purchase, MD   lisinopril  (PRINIVIL ,ZESTRIL ) 2.5 MG tablet Take 1 tablet (2.5 mg total) by mouth daily. Patient taking differently: Take 2.5 mg by mouth daily with breakfast.  01/25/15   Ragena Odor, MD  LORazepam  (ATIVAN ) 0.5 MG tablet Take 0.5 mg by mouth See admin instructions. Take one tablet (0.5 mg) by mouth one hour prior to lab, ekg, dental appointments, etc.    [provider]  lubiprostone  (AMITIZA ) 24 MCG capsule Take 24 mcg by mouth daily with breakfast.     [provider]  medroxyPROGESTERone (DEPO-PROVERA) 150 MG/ML injection Inject 150 mg into the muscle every 3 (three) months.    [provider]  metFORMIN  (GLUCOPHAGE -XR) 500 MG 24 hr tablet Take 1,000 mg by mouth daily with breakfast.    [provider]  nystatin  cream (MYCOSTATIN ) Apply 1 application topically See admin instructions. Apply topically to genital area twice daily as needed for rash    [provider]  omeprazole (PRILOSEC) 20 MG capsule Take 20 mg by mouth daily with breakfast.     [provider]  polyethylene glycol (MIRALAX  / GLYCOLAX ) packet Take 17 g by mouth 2 (two) times daily. Patient taking differently: Take 17 g by mouth 2 (two) times daily. Mix in 4-8 oz liquid of choice and drink 06/29/17   Armbruster, Elspeth SQUIBB, MD  simethicone (MYLICON) 80 MG chewable tablet Chew 80 mg by mouth 2 (two) times daily.    [provider]  Vitamin D , Ergocalciferol , (DRISDOL ) 50000 units CAPS capsule Take 50,000 Units by mouth See admin instructions. Take one capsule (50,000 units) by mouth twice weekly - on Tuesday and Friday    [provider]    Family History Family History  Problem Relation Age of Onset   Diabetes Mellitus II Mother    Cancer Father     Social History Social History   Tobacco Use   Smoking status: Never   Smokeless tobacco: Never  Substance Use Topics   Alcohol use: No   Drug use: No     Allergies   Haloperidol  lactate, Shrimp [shellfish allergy], and Strawberry (diagnostic)   Review of Systems Review of Systems  Musculoskeletal:        Left foot and ankle injury     Physical Exam Triage Vital Signs ED Triage Vitals  Encounter Vitals Group     BP 05/12/24 1053 117/80     Girls Systolic BP Percentile --      Girls Diastolic BP Percentile --      Boys Systolic BP Percentile --      Boys Diastolic BP Percentile --      Pulse Rate 05/12/24 1053 89     Resp 05/12/24 1053 17     Temp 05/12/24 1053 97.9 F (36.6 C)     Temp Source 05/12/24 1053 Oral  SpO2 05/12/24 1053 98 %     Weight --      Height --      Head Circumference --      Peak Flow --      Pain Score 05/12/24 1050 8     Pain Loc --      Pain Education --      Exclude from Growth Chart --    No data found.  Updated Vital Signs BP 117/80   Pulse 89   Temp 97.9 F (36.6 C) (Oral)   Resp 17   SpO2 98%   Visual Acuity Right Eye Distance:   Left Eye Distance:   Bilateral Distance:    Right Eye Near:   Left Eye Near:    Bilateral Near:     Physical Exam Vitals and nursing note reviewed.  Constitutional:      General: She is not in acute distress.    Appearance: Normal appearance. She is not ill-appearing.  HENT:     Head: Normocephalic and atraumatic.  Eyes:     Pupils: Pupils are equal, round, and reactive to light.  Cardiovascular:     Rate and Rhythm: Normal rate.  Pulmonary:     Effort: Pulmonary effort is normal.  Musculoskeletal:     Left ankle: Swelling present. No deformity, ecchymosis or lacerations. Tenderness present over the medial malleolus and base of 5th metatarsal. No proximal fibula tenderness. Decreased range of motion. Normal pulse.     Left foot: Decreased range of motion. Normal capillary refill. Swelling, tenderness and bony tenderness present. No deformity, bunion or Charcot foot. Normal pulse.     Comments: There is tenderness to palpation from the medial left ankle that extends  all the way to the distal toes.  There is moderate swelling with mild ecchymosis along the dorsal aspect of the foot.  Cap refill +2 in all digits.  Pain with dorsi flexion and extension of the foot  Skin:    General: Skin is warm and dry.  Neurological:     General: No focal deficit present.     Mental Status: She is alert and oriented to person, place, and time.  Psychiatric:        Mood and Affect: Mood normal.        Behavior: Behavior normal.      UC Treatments / Results  Labs (all labs ordered are listed, but only abnormal results are displayed) Labs Reviewed - No data to display  EKG   Radiology DG Foot Complete Left Result Date: 05/12/2024 CLINICAL DATA:  Status post trauma. EXAM: LEFT FOOT - COMPLETE 3+ VIEW COMPARISON:  None Available. FINDINGS: A small, mildly displaced fracture deformity is seen involving the dorsal aspect of the mid left foot. This appears to originate from the left medial cuneiform bone. An additional nondisplaced fracture of the base of the second left metatarsal is noted. An ill-defined area of cortical irregularity of indeterminate age is seen along the anterior aspect of the distal tip of the left tibia. There is no evidence of dislocation. Chronic and degenerative changes are seen along the dorsal aspect of the proximal and mid left foot. Mild to moderate severity dorsal soft tissue swelling is seen along the left foot. IMPRESSION: 1. Small, mildly displaced fracture deformity involving the dorsal aspect of the mid left foot. 2. Nondisplaced fracture of the base of the second left metatarsal. Nonemergent left foot CT is recommended to exclude additional occult fractures of the mid left foot.  3. Findings which may represent a small fracture of indeterminate age involving the anterior aspect of the distal tip of the left tibia. Correlation with physical examination of this region is recommended to determine the presence of point tenderness. Electronically  Signed   By: Suzen Dials M.D.   On: 05/12/2024 11:37   DG Ankle Complete Left Result Date: 05/12/2024 CLINICAL DATA:  Status post trauma. EXAM: LEFT ANKLE COMPLETE - 3+ VIEW COMPARISON:  None Available. FINDINGS: A small mildly displaced fracture deformity is seen involving the dorsal aspect of the left medial cuneiform bone. An additional ill-defined area of cortical irregularity of indeterminate age is seen along the dorsal aspect of the left navicular bone. There is no evidence of dislocation. There is a large plantar calcaneal spur. Chronic and degenerative changes seen along the dorsal aspect of the proximal to mid left foot. Mild to moderate severity diffuse left ankle soft tissue swelling is seen which may be, in part, related to the patient's body habitus. Mild-to-moderate severity dorsal soft tissue swelling is also noted along the left foot. IMPRESSION: 1. Small mildly displaced fracture deformity involving the dorsal aspect of the left medial cuneiform bone. 2. Fracture deformity of indeterminate age along the dorsal aspect of the left navicular bone. 3. Mild to moderate severity diffuse left ankle and dorsal left foot soft tissue swelling. Electronically Signed   By: Suzen Dials M.D.   On: 05/12/2024 11:32    Procedures Procedures (including critical care time)  Medications Ordered in UC Medications - No data to display  Initial Impression / Assessment and Plan / UC Course  I have reviewed the triage vital signs and the nursing notes.  Pertinent labs & imaging results that were available during my care of the patient were reviewed by me and considered in my medical decision making (see chart for details).     I reviewed x-ray results with patient and caregiver.  X-ray showing multiple fractures of foot and indeterminate age of distal tibia.  Will place patient in cam boot and have follow-up with orthopedics.  She is already established with [facility] but they  requested contact information for [facility] orthopedics which was provided.  Advised to keep the boot on until seen by Ortho.  Discussed RICE therapy and OTC analgesics as needed.  Strict ER precautions reviewed and patient and caregiver verbalized understanding Final Clinical Impressions(s) / UC Diagnoses   Final diagnoses:  Left foot pain  Acute left ankle pain  Displaced fracture of medial cuneiform of left foot, initial encounter for closed fracture  Closed fracture of base of metatarsal bone of left foot, with malunion, subsequent encounter  Closed fracture of distal end of left tibia, unspecified fracture morphology, initial encounter     Discharge Instructions      Keep the left foot in the boot until you are seen by orthopedics.  You can either follow-up with your orthopedist at [facility] or follow-up with the below listed orthopedist with [name].  You may elevate and ice as needed.  Use over-the-counter Tylenol  or ibuprofen  as needed for pain.  Please go to the ER for any worsening symptoms that occur prior to seeing orthopedics, this includes but is not limited to uncontrolled pain or swelling, persistent numbness or tingling, or any new concerns that arise.  Hope you feel better soon!     ED Prescriptions   None    PDMP not reviewed this encounter.   Loreda Myla SAUNDERS, NP 05/12/24 1201

## 2024-05-12 NOTE — ED Triage Notes (Addendum)
 Pt here w/caregiver. Caregiver states it was a witnessed fall. Pt fell down last step in house approx 3 days ago and injured left ankle and foot. Pt c/o left ankle and foot pain. PT denies hitting head. Pt unsure if she is on blood thinners. I looked at med list and did not see blood thinners on there. Pt has 2+ swelling of left ankle. Pt has 1+ left pedal pulse, cap refill less than 3 , warm to touch. PT walked slow to exam room

## 2024-05-23 ENCOUNTER — Other Ambulatory Visit: Payer: Self-pay

## 2024-05-23 ENCOUNTER — Inpatient Hospital Stay (HOSPITAL_COMMUNITY)
Admission: EM | Admit: 2024-05-23 | Discharge: 2024-05-29 | DRG: 040 | Disposition: A | Discharge status: Skilled Nursing Facility | Attending: Surgery | Admitting: Surgery

## 2024-05-23 ENCOUNTER — Emergency Department (HOSPITAL_COMMUNITY)

## 2024-05-23 DIAGNOSIS — E1165 Type 2 diabetes mellitus with hyperglycemia: Secondary | ICD-10-CM | POA: Diagnosis present

## 2024-05-23 DIAGNOSIS — Z888 Allergy status to other drugs, medicaments and biological substances status: Secondary | ICD-10-CM | POA: Diagnosis not present

## 2024-05-23 DIAGNOSIS — S12201A Unspecified nondisplaced fracture of third cervical vertebra, initial encounter for closed fracture: Secondary | ICD-10-CM | POA: Diagnosis present

## 2024-05-23 DIAGNOSIS — S12600A Unspecified displaced fracture of seventh cervical vertebra, initial encounter for closed fracture: Secondary | ICD-10-CM | POA: Diagnosis present

## 2024-05-23 DIAGNOSIS — R000 Tachycardia, unspecified: Secondary | ICD-10-CM | POA: Diagnosis not present

## 2024-05-23 DIAGNOSIS — Z91013 Allergy to seafood: Secondary | ICD-10-CM

## 2024-05-23 DIAGNOSIS — T1490XA Injury, unspecified, initial encounter: Principal | ICD-10-CM

## 2024-05-23 DIAGNOSIS — Z91018 Allergy to other foods: Secondary | ICD-10-CM | POA: Diagnosis not present

## 2024-05-23 DIAGNOSIS — S22048A Other fracture of fourth thoracic vertebra, initial encounter for closed fracture: Secondary | ICD-10-CM | POA: Diagnosis present

## 2024-05-23 DIAGNOSIS — S14129A Central cord syndrome at unspecified level of cervical spinal cord, initial encounter: Secondary | ICD-10-CM | POA: Diagnosis present

## 2024-05-23 DIAGNOSIS — E785 Hyperlipidemia, unspecified: Secondary | ICD-10-CM | POA: Diagnosis present

## 2024-05-23 DIAGNOSIS — S14109A Unspecified injury at unspecified level of cervical spinal cord, initial encounter: Secondary | ICD-10-CM

## 2024-05-23 DIAGNOSIS — F70 Mild intellectual disabilities: Secondary | ICD-10-CM | POA: Diagnosis present

## 2024-05-23 DIAGNOSIS — E871 Hypo-osmolality and hyponatremia: Secondary | ICD-10-CM | POA: Diagnosis present

## 2024-05-23 DIAGNOSIS — W01198A Fall on same level from slipping, tripping and stumbling with subsequent striking against other object, initial encounter: Secondary | ICD-10-CM | POA: Diagnosis present

## 2024-05-23 DIAGNOSIS — F209 Schizophrenia, unspecified: Secondary | ICD-10-CM | POA: Diagnosis present

## 2024-05-23 DIAGNOSIS — K219 Gastro-esophageal reflux disease without esophagitis: Secondary | ICD-10-CM | POA: Diagnosis present

## 2024-05-23 DIAGNOSIS — S12400A Unspecified displaced fracture of fifth cervical vertebra, initial encounter for closed fracture: Secondary | ICD-10-CM | POA: Diagnosis present

## 2024-05-23 DIAGNOSIS — S14123A Central cord syndrome at C3 level of cervical spinal cord, initial encounter: Secondary | ICD-10-CM | POA: Diagnosis present

## 2024-05-23 DIAGNOSIS — Z7984 Long term (current) use of oral hypoglycemic drugs: Secondary | ICD-10-CM | POA: Diagnosis not present

## 2024-05-23 DIAGNOSIS — E559 Vitamin D deficiency, unspecified: Secondary | ICD-10-CM | POA: Diagnosis present

## 2024-05-23 DIAGNOSIS — S0181XA Laceration without foreign body of other part of head, initial encounter: Secondary | ICD-10-CM | POA: Diagnosis present

## 2024-05-23 DIAGNOSIS — Z833 Family history of diabetes mellitus: Secondary | ICD-10-CM

## 2024-05-23 DIAGNOSIS — M47814 Spondylosis without myelopathy or radiculopathy, thoracic region: Secondary | ICD-10-CM | POA: Diagnosis present

## 2024-05-23 DIAGNOSIS — R6250 Unspecified lack of expected normal physiological development in childhood: Secondary | ICD-10-CM | POA: Diagnosis present

## 2024-05-23 DIAGNOSIS — Z79899 Other long term (current) drug therapy: Secondary | ICD-10-CM

## 2024-05-23 DIAGNOSIS — Y92009 Unspecified place in unspecified non-institutional (private) residence as the place of occurrence of the external cause: Secondary | ICD-10-CM | POA: Diagnosis not present

## 2024-05-23 DIAGNOSIS — G928 Other toxic encephalopathy: Secondary | ICD-10-CM | POA: Diagnosis not present

## 2024-05-23 DIAGNOSIS — S22008A Other fracture of unspecified thoracic vertebra, initial encounter for closed fracture: Secondary | ICD-10-CM

## 2024-05-23 DIAGNOSIS — Z793 Long term (current) use of hormonal contraceptives: Secondary | ICD-10-CM

## 2024-05-23 DIAGNOSIS — M542 Cervicalgia: Secondary | ICD-10-CM | POA: Diagnosis present

## 2024-05-23 DIAGNOSIS — S22059A Unspecified fracture of T5-T6 vertebra, initial encounter for closed fracture: Secondary | ICD-10-CM | POA: Diagnosis present

## 2024-05-23 DIAGNOSIS — I10 Essential (primary) hypertension: Secondary | ICD-10-CM | POA: Diagnosis present

## 2024-05-23 DIAGNOSIS — S129XXA Fracture of neck, unspecified, initial encounter: Secondary | ICD-10-CM

## 2024-05-23 LAB — COMPREHENSIVE METABOLIC PANEL WITH GFR
ALT: 16 U/L (ref 0–44)
AST: 26 U/L (ref 15–41)
Albumin: 2.5 g/dL — ABNORMAL LOW (ref 3.5–5.0)
Alkaline Phosphatase: 40 U/L (ref 38–126)
Anion gap: 12 (ref 5–15)
BUN: 17 mg/dL (ref 6–20)
CO2: 17 mmol/L — ABNORMAL LOW (ref 22–32)
Calcium: 8.3 mg/dL — ABNORMAL LOW (ref 8.9–10.3)
Chloride: 105 mmol/L (ref 98–111)
Creatinine, Ser: 0.71 mg/dL (ref 0.44–1.00)
GFR, Estimated: >60 mL/min (ref >60)
Glucose, Bld: 148 mg/dL — ABNORMAL HIGH (ref 70–99)
Potassium: 5.1 mmol/L (ref 3.5–5.1)
Sodium: 134 mmol/L — ABNORMAL LOW (ref 135–145)
Total Bilirubin: 0.4 mg/dL (ref 0.0–1.2)
Total Protein: 5.2 g/dL — ABNORMAL LOW (ref 6.5–8.1)

## 2024-05-23 LAB — CBC WITH DIFFERENTIAL/PLATELET
Abs Immature Granulocytes: 0.14 K/uL — ABNORMAL HIGH (ref 0.00–0.07)
Basophils Absolute: 0.1 K/uL (ref 0.0–0.1)
Basophils Relative: 0 %
Eosinophils Absolute: 0.1 K/uL (ref 0.0–0.5)
Eosinophils Relative: 1 %
HCT: 38.5 % (ref 36.0–46.0)
Hemoglobin: 12.1 g/dL (ref 12.0–15.0)
Immature Granulocytes: 1 %
Lymphocytes Relative: 22 %
Lymphs Abs: 2.9 K/uL (ref 0.7–4.0)
MCH: 28.2 pg (ref 26.0–34.0)
MCHC: 31.4 g/dL (ref 30.0–36.0)
MCV: 89.7 fL (ref 80.0–100.0)
Monocytes Absolute: 1 K/uL (ref 0.1–1.0)
Monocytes Relative: 7 %
Neutro Abs: 9 K/uL — ABNORMAL HIGH (ref 1.7–7.7)
Neutrophils Relative %: 69 %
Platelets: 216 K/uL (ref 150–400)
RBC: 4.29 MIL/uL (ref 3.87–5.11)
RDW: 17.2 % — ABNORMAL HIGH (ref 11.5–15.5)
WBC: 13.2 K/uL — ABNORMAL HIGH (ref 4.0–10.5)
nRBC: 0 % (ref 0.0–0.2)

## 2024-05-23 LAB — I-STAT CHEM 8, ED
BUN: 18 mg/dL (ref 6–20)
Calcium, Ion: 1.09 mmol/L — ABNORMAL LOW (ref 1.15–1.40)
Chloride: 106 mmol/L (ref 98–111)
Creatinine, Ser: 0.6 mg/dL (ref 0.44–1.00)
Glucose, Bld: 172 mg/dL — ABNORMAL HIGH (ref 70–99)
HCT: 41 % (ref 36.0–46.0)
Hemoglobin: 13.9 g/dL (ref 12.0–15.0)
Potassium: 4.8 mmol/L (ref 3.5–5.1)
Sodium: 138 mmol/L (ref 135–145)
TCO2: 18 mmol/L — ABNORMAL LOW (ref 22–32)

## 2024-05-23 LAB — CBG MONITORING, ED
Glucose-Capillary: 142 mg/dL — ABNORMAL HIGH (ref 70–99)
Glucose-Capillary: 130 mg/dL — ABNORMAL HIGH (ref 70–99)
Glucose-Capillary: 166 mg/dL — ABNORMAL HIGH (ref 70–99)

## 2024-05-23 LAB — URINALYSIS, ROUTINE W REFLEX MICROSCOPIC
Bilirubin Urine: NEGATIVE
Glucose, UA: NEGATIVE mg/dL
Hgb urine dipstick: NEGATIVE
Ketones, ur: 5 mg/dL — AB
Leukocytes,Ua: NEGATIVE
Nitrite: NEGATIVE
Protein, ur: NEGATIVE mg/dL
Specific Gravity, Urine: >1.046 — ABNORMAL HIGH (ref 1.005–1.030)
pH: 5 (ref 5.0–8.0)

## 2024-05-23 LAB — HEMOGLOBIN A1C
Hgb A1c MFr Bld: 7 % — ABNORMAL HIGH (ref 4.8–5.6)
Mean Plasma Glucose: 154.2 mg/dL

## 2024-05-23 LAB — TYPE AND SCREEN
ABO/RH(D): A POS
Antibody Screen: NEGATIVE

## 2024-05-23 LAB — PROTIME-INR
INR: 1 (ref 0.8–1.2)
Prothrombin Time: 14.1 s (ref 11.4–15.2)

## 2024-05-23 MED ORDER — DIVALPROEX SODIUM 500 MG PO DR TAB
500.0000 mg | DELAYED_RELEASE_TABLET | Freq: Every morning | ORAL | Status: DC
  Administered 2024-05-23 – 2024-05-29 (×7): 500 mg via ORAL
  Filled 2024-05-23 (×7): qty 1

## 2024-05-23 MED ORDER — POLYETHYLENE GLYCOL 3350 17 G PO PACK
17.0000 g | PACK | Freq: Every day | ORAL | Status: DC | PRN
  Administered 2024-05-26: 17 g via ORAL

## 2024-05-23 MED ORDER — CLOZAPINE 100 MG PO TABS
100.0000 mg | ORAL_TABLET | Freq: Two times a day (BID) | ORAL | Status: DC
  Administered 2024-05-23 – 2024-05-29 (×12): 100 mg via ORAL
  Filled 2024-05-23 (×14): qty 1

## 2024-05-23 MED ORDER — IOHEXOL 350 MG/ML SOLN
75.0000 mL | Freq: Once | INTRAVENOUS | Status: AC | PRN
  Administered 2024-05-23: 75 mL via INTRAVENOUS

## 2024-05-23 MED ORDER — ACETAMINOPHEN 500 MG PO TABS
1000.0000 mg | ORAL_TABLET | Freq: Four times a day (QID) | ORAL | Status: DC
  Administered 2024-05-23 – 2024-05-29 (×18): 1000 mg via ORAL
  Filled 2024-05-23 (×19): qty 2

## 2024-05-23 MED ORDER — SODIUM CHLORIDE 0.9 % IV SOLN: 250.0000 mL | INTRAVENOUS | Status: DC | PRN

## 2024-05-23 MED ORDER — INSULIN ASPART 100 UNIT/ML IJ SOLN
0.0000 [IU] | Freq: Three times a day (TID) | INTRAMUSCULAR | Status: DC
  Administered 2024-05-23: 2 [IU] via SUBCUTANEOUS
  Administered 2024-05-24 (×2): 3 [IU] via SUBCUTANEOUS
  Administered 2024-05-24: 8 [IU] via SUBCUTANEOUS
  Administered 2024-05-25: 2 [IU] via SUBCUTANEOUS
  Administered 2024-05-25: 8 [IU] via SUBCUTANEOUS
  Administered 2024-05-25: 15 [IU] via SUBCUTANEOUS
  Administered 2024-05-26 (×2): 2 [IU] via SUBCUTANEOUS
  Administered 2024-05-26 – 2024-05-27 (×2): 3 [IU] via SUBCUTANEOUS
  Administered 2024-05-27: 2 [IU] via SUBCUTANEOUS
  Administered 2024-05-27: 3 [IU] via SUBCUTANEOUS
  Administered 2024-05-28: 2 [IU] via SUBCUTANEOUS
  Administered 2024-05-28 (×2): 3 [IU] via SUBCUTANEOUS
  Administered 2024-05-29: 5 [IU] via SUBCUTANEOUS
  Administered 2024-05-29: 3 [IU] via SUBCUTANEOUS

## 2024-05-23 MED ORDER — BENZTROPINE MESYLATE 1 MG PO TABS
1.0000 mg | ORAL_TABLET | Freq: Two times a day (BID) | ORAL | Status: DC
  Administered 2024-05-23 – 2024-05-29 (×12): 1 mg via ORAL
  Filled 2024-05-23 (×15): qty 1

## 2024-05-23 MED ORDER — HYDROMORPHONE HCL 1 MG/ML IJ SOLN
0.5000 mg | INTRAMUSCULAR | Status: DC | PRN
  Administered 2024-05-23: 0.5 mg via INTRAVENOUS
  Filled 2024-05-23 (×2): qty 1

## 2024-05-23 MED ORDER — METHOCARBAMOL 500 MG PO TABS
1000.0000 mg | ORAL_TABLET | Freq: Three times a day (TID) | ORAL | Status: AC
  Administered 2024-05-24 – 2024-05-26 (×5): 1000 mg via ORAL
  Filled 2024-05-23 (×6): qty 2

## 2024-05-23 MED ORDER — DIVALPROEX SODIUM 250 MG PO DR TAB: 1000.0000 mg | DELAYED_RELEASE_TABLET | ORAL | Status: DC

## 2024-05-23 MED ORDER — FENTANYL CITRATE PF 50 MCG/ML IJ SOSY
50.0000 ug | PREFILLED_SYRINGE | Freq: Once | INTRAMUSCULAR | Status: AC
  Administered 2024-05-23: 50 ug via INTRAVENOUS
  Filled 2024-05-23: qty 1

## 2024-05-23 MED ORDER — OXYCODONE HCL 5 MG PO TABS: 5.0000 mg | ORAL_TABLET | ORAL | Status: DC | PRN

## 2024-05-23 MED ORDER — SODIUM CHLORIDE 0.9% FLUSH: 3.0000 mL | Freq: Two times a day (BID) | INTRAVENOUS | Status: DC

## 2024-05-23 MED ORDER — DOCUSATE SODIUM 100 MG PO CAPS
100.0000 mg | ORAL_CAPSULE | Freq: Two times a day (BID) | ORAL | Status: DC
  Administered 2024-05-23 – 2024-05-28 (×10): 100 mg via ORAL
  Filled 2024-05-23 (×11): qty 1

## 2024-05-23 MED ORDER — HYDRALAZINE HCL 20 MG/ML IJ SOLN: 10.0000 mg | INTRAMUSCULAR | Status: DC | PRN

## 2024-05-23 MED ORDER — GABAPENTIN 300 MG PO CAPS
300.0000 mg | ORAL_CAPSULE | Freq: Three times a day (TID) | ORAL | Status: DC
  Administered 2024-05-23 – 2024-05-29 (×18): 300 mg via ORAL
  Filled 2024-05-23 (×18): qty 1

## 2024-05-23 MED ORDER — SODIUM CHLORIDE 0.9% FLUSH: 3.0000 mL | INTRAVENOUS | Status: DC | PRN

## 2024-05-23 MED ORDER — METOPROLOL TARTRATE 5 MG/5ML IV SOLN: 5.0000 mg | Freq: Four times a day (QID) | INTRAVENOUS | Status: DC | PRN

## 2024-05-23 MED ORDER — DIVALPROEX SODIUM 500 MG PO DR TAB
1000.0000 mg | DELAYED_RELEASE_TABLET | Freq: Every evening | ORAL | Status: DC
  Administered 2024-05-24 – 2024-05-28 (×5): 1000 mg via ORAL
  Filled 2024-05-23 (×6): qty 2

## 2024-05-23 MED ORDER — POLYETHYLENE GLYCOL 3350 17 G PO PACK
17.0000 g | PACK | Freq: Two times a day (BID) | ORAL | Status: DC
  Administered 2024-05-23 – 2024-05-28 (×9): 17 g via ORAL
  Filled 2024-05-23 (×9): qty 1

## 2024-05-23 MED ORDER — DICYCLOMINE HCL 10 MG PO CAPS
10.0000 mg | ORAL_CAPSULE | Freq: Three times a day (TID) | ORAL | Status: DC | PRN
Start: 2024-05-23 — End: 2024-05-29

## 2024-05-23 MED ORDER — SIMETHICONE 80 MG PO CHEW
80.0000 mg | CHEWABLE_TABLET | Freq: Two times a day (BID) | ORAL | Status: DC
  Administered 2024-05-23 – 2024-05-29 (×12): 80 mg via ORAL
  Filled 2024-05-23 (×12): qty 1

## 2024-05-23 MED ORDER — ONDANSETRON 4 MG PO TBDP: 4.0000 mg | ORAL_TABLET | Freq: Four times a day (QID) | ORAL | Status: DC | PRN

## 2024-05-23 MED ORDER — LISINOPRIL 5 MG PO TABS
2.5000 mg | ORAL_TABLET | Freq: Every day | ORAL | Status: DC
Start: 2024-05-24 — End: 2024-05-24
  Administered 2024-05-24: 2.5 mg via ORAL
  Filled 2024-05-23 (×2): qty 1

## 2024-05-23 MED ORDER — METHOCARBAMOL 1000 MG/10ML IJ SOLN
1000.0000 mg | Freq: Three times a day (TID) | INTRAMUSCULAR | Status: AC
  Administered 2024-05-23: 1000 mg via INTRAVENOUS
  Filled 2024-05-23: qty 10

## 2024-05-23 MED ORDER — LIDOCAINE-EPINEPHRINE-TETRACAINE (LET) TOPICAL GEL
3.0000 mL | Freq: Once | TOPICAL | Status: AC
  Administered 2024-05-23: 3 mL via TOPICAL
  Filled 2024-05-23: qty 3

## 2024-05-23 MED ORDER — PANTOPRAZOLE SODIUM 40 MG PO TBEC
40.0000 mg | DELAYED_RELEASE_TABLET | Freq: Every day | ORAL | Status: DC
Start: 2024-05-24 — End: 2024-05-29
  Administered 2024-05-24 – 2024-05-29 (×6): 40 mg via ORAL
  Filled 2024-05-23 (×6): qty 1

## 2024-05-23 MED ORDER — ATORVASTATIN CALCIUM 80 MG PO TABS
80.0000 mg | ORAL_TABLET | Freq: Every day | ORAL | Status: DC
  Administered 2024-05-23 – 2024-05-28 (×6): 80 mg via ORAL
  Filled 2024-05-23 (×2): qty 1
  Filled 2024-05-23: qty 2
  Filled 2024-05-23 (×3): qty 1

## 2024-05-23 MED ORDER — LUBIPROSTONE 24 MCG PO CAPS
24.0000 ug | ORAL_CAPSULE | Freq: Every day | ORAL | Status: DC
  Administered 2024-05-24 – 2024-05-29 (×6): 24 ug via ORAL
  Filled 2024-05-23 (×7): qty 1

## 2024-05-23 MED ORDER — FERROUS SULFATE 325 (65 FE) MG PO TABS
325.0000 mg | ORAL_TABLET | Freq: Every day | ORAL | Status: DC
  Administered 2024-05-24 – 2024-05-29 (×6): 325 mg via ORAL
  Filled 2024-05-23 (×6): qty 1

## 2024-05-23 MED ORDER — ONDANSETRON HCL 4 MG/2ML IJ SOLN
4.0000 mg | Freq: Four times a day (QID) | INTRAMUSCULAR | Status: DC | PRN
  Administered 2024-05-23: 4 mg via INTRAVENOUS
  Filled 2024-05-23: qty 2

## 2024-05-23 MED ORDER — INSULIN ASPART 100 UNIT/ML IJ SOLN: 0.0000 [IU] | Freq: Every day | INTRAMUSCULAR | Status: DC

## 2024-05-23 MED ORDER — ATENOLOL 50 MG PO TABS
50.0000 mg | ORAL_TABLET | Freq: Every day | ORAL | Status: DC
Start: 2024-05-24 — End: 2024-05-24
  Administered 2024-05-24: 50 mg via ORAL
  Filled 2024-05-23: qty 1

## 2024-05-23 MED ORDER — LIDOCAINE-EPINEPHRINE (PF) 2 %-1:200000 IJ SOLN
20.0000 mL | Freq: Once | INTRAMUSCULAR | Status: AC
  Administered 2024-05-23: 20 mL
  Filled 2024-05-23: qty 20

## 2024-05-23 MED ORDER — SODIUM BICARBONATE 4.2 % IV SOLN
5.0000 mL | Freq: Once | INTRAVENOUS | Status: AC
  Administered 2024-05-23: 5 mL via INTRAVENOUS
  Filled 2024-05-23: qty 5

## 2024-05-23 MED ORDER — DIAZEPAM 5 MG/ML IJ SOLN
2.5000 mg | Freq: Once | INTRAMUSCULAR | Status: AC | PRN
  Administered 2024-05-23: 2.5 mg via INTRAVENOUS
  Filled 2024-05-23: qty 2

## 2024-05-23 MED ORDER — VITAMIN D (ERGOCALCIFEROL) 1.25 MG (50000 UNIT) PO CAPS
50000.0000 [IU] | ORAL_CAPSULE | ORAL | Status: DC
  Administered 2024-05-27: 50000 [IU] via ORAL
  Filled 2024-05-23: qty 1

## 2024-05-23 MED ORDER — LORAZEPAM 1 MG PO TABS
0.5000 mg | ORAL_TABLET | Freq: Four times a day (QID) | ORAL | Status: DC | PRN
Start: 2024-05-23 — End: 2024-05-29

## 2024-05-23 NOTE — ED Notes (Signed)
 Cart and medications for scalp repair at bedside. Family at bedside with pt

## 2024-05-23 NOTE — Consult Note (Signed)
 ENT CONSULT:  Reason for Consult: Forehead laceration  Referring Physician:  Lavanda Lesches PA  HPI: Alexandria Harvey is an 43 y.o. female with history of schizophrenia, seizures, mild intellectual disability, living in residential home who presents to the ER after ground-level fall resulting in cervical spine fractures and large complex forehead laceration.  ED team consulting facial plastics for forehead laceration repair.  Patient's mother and caregiver at bedside.  Guardian was contacted via telehealth during this consultation.  After discussing RBA of procedures patient's guardian is amenable to having laceration repair performed under local anesthesia in the emergency room.   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 She has never used smokeless tobacco. She reports that she does not drink alcohol and does not use drugs.  Allergies:  Allergies  Allergen Reactions   Haloperidol Lactate Other (See Comments)    Unknown reaction   Shrimp [Shellfish Allergy] Hives and Rash   Strawberry (Diagnostic) Rash    Medications: I have reviewed the patient's current medications.  Results for orders placed or performed during the hospital encounter of 05/23/24 (from the past 48 hours)  CBC with Differential     Status: Abnormal   Collection Time: 05/23/24  9:29 AM  Result Value Ref Range   WBC 13.2 (H) 4.0 - 10.5 K/uL   RBC 4.29 3.87 - 5.11 MIL/uL   Hemoglobin 12.1 12.0  - 15.0 g/dL   HCT 61.4 63.9 - 53.9 %   MCV 89.7 80.0 - 100.0 fL   MCH 28.2 26.0 - 34.0 pg   MCHC 31.4 30.0 - 36.0 g/dL   RDW 82.7 (H) 88.4 - 84.4 %   Platelets 216 150 - 400 K/uL   nRBC 0.0 0.0 - 0.2 %   Neutrophils Relative % 69 %   Neutro Abs 9.0 (H) 1.7 - 7.7 K/uL   Lymphocytes Relative 22 %   Lymphs Abs 2.9 0.7 - 4.0 K/uL   Monocytes Relative 7 %   Monocytes Absolute 1.0 0.1 - 1.0 K/uL   Eosinophils Relative 1 %   Eosinophils Absolute 0.1 0.0 - 0.5 K/uL   Basophils Relative 0 %   Basophils Absolute 0.1 0.0 - 0.1 K/uL   Immature Granulocytes 1 %   Abs Immature Granulocytes 0.14 (H) 0.00 - 0.07 K/uL    Comment: Performed at [facility], [address], [place], [place] [address]  Protime-INR     Status: None   Collection Time: 05/23/24  9:29 AM  Result Value Ref Range   Prothrombin Time 14.1 11.4 - 15.2 seconds   INR 1.0 0.8 - 1.2    Comment: (NOTE) INR goal varies based on device and disease states. Performed at [facility], [address], [place], [place] [address]   Type and screen [facility]     Status: None  Collection Time: 05/23/24  9:30 AM  Result Value Ref Range   ABO/RH(D) A POS    Antibody Screen NEG    Sample Expiration      05/26/2024,2359 Performed at [facility], [address] [place], [place] [address]   I-stat chem 8, ED (not at [facility], [facility] or [facility])     Status: Abnormal   Collection Time: 05/23/24  9:55 AM  Result Value Ref Range   Sodium 138 135 - 145 mmol/L   Potassium 4.8 3.5 - 5.1 mmol/L   Chloride 106 98 - 111 mmol/L   BUN 18 6 - 20 mg/dL   Creatinine, Ser 9.39 0.44 - 1.00 mg/dL   Glucose, Bld 827 (H) 70 - 99 mg/dL    Comment: Glucose reference range applies only to samples taken after fasting for at least 8 hours.   Calcium , Ion 1.09 (L) 1.15 - 1.40 mmol/L   TCO2 18 (L) 22 - 32 mmol/L   Hemoglobin 13.9 12.0 - 15.0 g/dL   HCT 58.9 63.9 - 53.9 %  CBG monitoring, ED     Status: Abnormal   Collection  Time: 05/23/24 10:00 AM  Result Value Ref Range   Glucose-Capillary 142 (H) 70 - 99 mg/dL    Comment: Glucose reference range applies only to samples taken after fasting for at least 8 hours.  Comprehensive metabolic panel with GFR     Status: Abnormal   Collection Time: 05/23/24 12:00 PM  Result Value Ref Range   Sodium 134 (L) 135 - 145 mmol/L   Potassium 5.1 3.5 - 5.1 mmol/L   Chloride 105 98 - 111 mmol/L   CO2 17 (L) 22 - 32 mmol/L   Glucose, Bld 148 (H) 70 - 99 mg/dL    Comment: Glucose reference range applies only to samples taken after fasting for at least 8 hours.   BUN 17 6 - 20 mg/dL   Creatinine, Ser 9.28 0.44 - 1.00 mg/dL   Calcium  8.3 (L) 8.9 - 10.3 mg/dL   Total Protein 5.2 (L) 6.5 - 8.1 g/dL   Albumin 2.5 (L) 3.5 - 5.0 g/dL   AST 26 15 - 41 U/L   ALT 16 0 - 44 U/L   Alkaline Phosphatase 40 38 - 126 U/L   Total Bilirubin 0.4 0.0 - 1.2 mg/dL   GFR, Estimated >39 >39 mL/min    Comment: (NOTE) Calculated using the CKD-EPI Creatinine Equation (2021)    Anion gap 12 5 - 15    Comment: Performed at [facility], [address], [place], [place] [address]  Urinalysis, Routine w reflex microscopic -Urine, Clean Catch     Status: Abnormal   Collection Time: 05/23/24 12:09 PM  Result Value Ref Range   Color, Urine YELLOW YELLOW   APPearance CLEAR CLEAR   Specific Gravity, Urine >1.046 (H) 1.005 - 1.030   pH 5.0 5.0 - 8.0   Glucose, UA NEGATIVE NEGATIVE mg/dL   Hgb urine dipstick NEGATIVE NEGATIVE   Bilirubin Urine NEGATIVE NEGATIVE   Ketones, ur 5 (A) NEGATIVE mg/dL   Protein, ur NEGATIVE NEGATIVE mg/dL   Nitrite NEGATIVE NEGATIVE   Leukocytes,Ua NEGATIVE NEGATIVE    Comment: Performed at [facility], [address], [place], [place] [address]    CT T-SPINE NO CHARGE Result Date: 05/23/2024 CLINICAL DATA:  Fall down stairs. Neck and back pain. Cervical spine fractures. EXAM: CT Thoracic Spine without contrast TECHNIQUE: Multiplanar CT images of the  thoracic spine were  reconstructed from contemporary CT of the Chest. RADIATION DOSE REDUCTION: This exam was performed according to the departmental dose-optimization program which includes automated exposure control, adjustment of the mA and/or kV according to patient size and/or use of iterative reconstruction technique. CONTRAST:  No additional COMPARISON:  Same day cervical spine CT. Thoracic spine radiographs 02/07/2019. Chest CT 167183. FINDINGS: Alignment: Normal. Vertebrae: In addition to the separately reported cervical spine injuries, there are acute minimally displaced fractures of the T4 and T5 vertebral bodies. Minimal osseous retropulsion at T4. Fracture extends into the inferior right T4 articulating facet and spinous process. No significant loss of vertebral body height. No other fractures are identified. Paraspinal and other soft tissues: Mild anterior paraspinal soft tissue edema at T4 and T5. No focal hematoma. Additional chest details dictated separately. Disc levels: Mild multilevel thoracic spondylosis with disc bulging, small disc protrusions and endplate osteophytes. IMPRESSION: 1. Acute minimally displaced fractures of the T4 and T5 vertebral bodies with extension into the inferior right T4 articulating facet and spinous process. Minimal osseous retropulsion at T4. The T4 fracture is best categorized as a Chance fracture, potentially unstable. 2. No other acute osseous findings. 3. Mild thoracic spondylosis. 4. Additional chest details dictated separately. See separate cervical spine CT report. 5. These results were called by telephone at the time of interpretation on 05/23/2024 at 11:14 am to provider Prentice Medicus, who verbally acknowledged these results. Electronically Signed   By: Elsie Perone M.D.   On: 05/23/2024 11:14   CT CHEST ABDOMEN PELVIS W CONTRAST Result Date: 05/23/2024 EXAM: CT CHEST, ABDOMEN AND PELVIS WITH CONTRAST 05/23/2024 10:13:43 AM TECHNIQUE: CT of the chest,  abdomen and pelvis was performed with the administration of intravenous contrast. Multiplanar reformatted images are provided for review. Automated exposure control, iterative reconstruction, and/or weight based adjustment of the mA/kV was utilized to reduce the radiation dose to as low as reasonably achievable. COMPARISON: CT of the chest dated 12/03/2014. CLINICAL HISTORY: Trauma. Triage notes; Patient arrives via [facility] for fall down 4-5 steps. Patient with lac to middle of head, right knee abrasion, and abrasion to right forearm. Patient endorses missing a step coming steps at her home. Patient arrives in c-collar placed by fire. Bleeding controlled at time of arrival. FINDINGS: CHEST: MEDIASTINUM AND LYMPH NODES: Heart and pericardium are unremarkable. The central airways are clear. No mediastinal, hilar or axillary lymphadenopathy. LUNGS AND PLEURA: No focal consolidation or pulmonary edema. No pleural effusion or pneumothorax. ABDOMEN AND PELVIS: LIVER: The liver is unremarkable. GALLBLADDER AND BILE DUCTS: Gallbladder is unremarkable. No biliary ductal dilatation. SPLEEN: No acute abnormality. PANCREAS: No acute abnormality. ADRENAL GLANDS: No acute abnormality. KIDNEYS, URETERS AND BLADDER: No stones in the kidneys or ureters. No hydronephrosis. No perinephric or periureteral stranding. Urinary bladder is unremarkable. GI AND BOWEL: Stomach demonstrates no acute abnormality. There is no bowel obstruction. REPRODUCTIVE ORGANS: No acute abnormality. PERITONEUM AND RETROPERITONEUM: No ascites. No free air. VASCULATURE: Aorta is normal in caliber. ABDOMINAL AND PELVIS LYMPH NODES: No lymphadenopathy. REPRODUCTIVE ORGANS: No acute abnormality. BONES AND SOFT TISSUES: Chronic bilateral spondylolysis and slight anterolisthesis present at L5-S1. Healed fracture deformities of the left posterior rib cage. No acute fractures evident. IMPRESSION: 1. No acute abnormality of the chest, abdomen, and pelvis related to the  provided clinical history of trauma. Electronically signed by: Evalene Coho MD 05/23/2024 10:36 AM EDT RP Workstation: [id number]   DG Chest Portable 1 View Result Date: 05/23/2024 EXAM: 1 VIEW XRAY OF THE CHEST 05/23/2024 10:15:00 AM COMPARISON:  Radiograph of the chest dated 09/10/2019. CLINICAL HISTORY: Trauma. Level 2 trauma- Fall. FINDINGS: LUNGS AND PLEURA: Low lung volumes. No focal pulmonary opacity. No pulmonary edema. No pleural effusion. No pneumothorax. HEART AND MEDIASTINUM: No acute abnormality of the cardiac and mediastinal silhouettes. BONES AND SOFT TISSUES: Degenerative changes of right shoulder. No acute osseous abnormality. IMPRESSION: 1. No acute process. 2. Low lung volumes. Electronically signed by: Evalene Coho MD 05/23/2024 10:31 AM EDT RP Workstation: [id number]   CT Cervical Spine Wo Contrast Result Date: 05/23/2024 CLINICAL DATA:  Fall down multiple steps. Head laceration and neck pain. EXAM: CT CERVICAL SPINE WITHOUT CONTRAST TECHNIQUE: Multidetector CT imaging of the cervical spine was performed without intravenous contrast. Multiplanar CT image reconstructions were also generated. RADIATION DOSE REDUCTION: This exam was performed according to the departmental dose-optimization program which includes automated exposure control, adjustment of the mA and/or kV according to patient size and/or use of iterative reconstruction technique. COMPARISON:  CT cervical spine 09/08/2019. MRI cervical spine 04/09/2020. FINDINGS: Alignment: Normal. Skull base and vertebrae: There are multiple acute cervical spine fractures. There are nondisplaced fractures involving the base of the C3 spinous process and left lamina. There is partial interbody and interfacetal ankylosis at C2-3. There is a comminuted and mildly displaced fracture of the C5 spinous process and right lamina. There is a mildly displaced fracture of the C7 spinous process. No definite involvement of the facet joints or  traumatic subluxation. No evidence of vertebral body fracture. Soft tissues and spinal canal: No prevertebral fluid or swelling. No visible canal hematoma. There is posterior paraspinal soft tissue swelling adjacent to the fractures of the posterior elements. Disc levels: The disc heights are relatively maintained. There are paraspinal osteophytes which are most prominent anteriorly at C4-5 and C5-6. As above, partial interbody and interfacetal ankylosis at C2-3. No evidence of large disc herniation or high-grade foraminal narrowing. Upper chest: Chest findings dictated separately. Other: Carotid atherosclerosis. IMPRESSION: 1. Multiple acute cervical spine fractures involving the C3, C5 and C7 posterior elements as described. No evidence of traumatic subluxation. 2. No evidence of vertebral body fracture. 3. No evidence of large disc herniation or high-grade foraminal narrowing. 4. Critical Value/emergent results were called by telephone at the time of interpretation on 05/23/2024 at 10:30 am to provider ANDREW TEE , who verbally acknowledged these results. Electronically Signed   By: Elsie Perone M.D.   On: 05/23/2024 10:31   CT Head Wo Contrast Result Date: 05/23/2024 EXAM: CT HEAD WITHOUT CONTRAST 05/23/2024 10:13:43 AM TECHNIQUE: CT of the head was performed without the administration of intravenous contrast. Automated exposure control, iterative reconstruction, and/or weight based adjustment of the mA/kV was utilized to reduce the radiation dose to as low as reasonably achievable. COMPARISON: 09/08/2019 CLINICAL HISTORY: Trauma. Patient arrives via [facility] for fall down 4-5 steps. Patient with lac to middle of head, right knee abrasion, and abrasion to right forearm. Patient endorses missing a step coming steps at her home. Patient arrives in c-collar placed by fire. Bleeding controlled at time of arrival. Patient having trouble moving fingers and toes, endorses head and neck pain. CBG 188. BP 102/60, HR  104, 98 on room air. FINDINGS: BRAIN AND VENTRICLES: No acute hemorrhage. No evidence of acute infarct. No hydrocephalus. No extra-axial collection. No mass effect or midline shift. Moderate generalized cerebral volume loss present, which is advanced for the patient's age. Mild periventricular white matter disease. ORBITS: No acute abnormality. SINUSES: No acute abnormality. SOFT TISSUES AND SKULL: Right forehead laceration and  soft tissue swelling. No skull fracture. IMPRESSION: 1. No acute intracranial abnormality. 2. Moderate generalized cerebral volume loss, advanced for the patient's age. 3. Mild periventricular white matter disease. 4. Right forehead laceration and soft tissue swelling. Electronically signed by: Evalene Coho MD 05/23/2024 10:30 AM EDT RP Workstation: [id number]    MND:wzhjupcz other than stated per HPI  Blood pressure (!) 102/53, pulse 100, temperature 98.3 F (36.8 C), temperature source Oral, resp. rate 14, height 5' 2.5 (1.588 m), SpO2 100%.  PHYSICAL EXAM:  Pleasant adult female resting comfortably in NAD. Cooperative. Large 12 cm forehead laceration, full thickness right paramedian violating the eyebrow, extending 4cm into the hairline. Exposed calvarium. No active bleeding.   Studies Reviewed:none  Assessment/Plan: 43 year old female with schizophrenia, seizures, mild intellectual disability, living in residential home who presents to the ER after ground-level fall resulting in cervical spine fractures and large complex forehead laceration.  I recommended washout and repair.  Patient's guardian, caregiver and mother believe the patient will tolerate local anesthetic with light sedation i.e. IV fentanyl .  Discussed with ED PA Abigail.  Will plan for repair at bedside after surgery today tentative 7 PM.  Please keep patient in emergency room until procedure completed.  RBA discussed with patient's guardian.  Risks include pain, bleeding, infection, scarring,  numbness, poor cosmesis, need for further surgery, risks of anesthesia.  Medical Decision Making: 3 #/Compl Problems  3                         Data Rev  3                 Management 3             (1-Straightforward, 2-Low, 3- Moderate, 4-High)   Electronically signed by:  Elspeth Coddington, MD  Staff Physician Facial Plastic & Reconstructive Surgery Otolaryngology - Head and Neck Surgery [facility]   05/23/2024, 12:44 PM

## 2024-05-23 NOTE — ED Notes (Signed)
 Awaiting MD verification  c spine is cleared and can sit pt up for med admin

## 2024-05-23 NOTE — ED Notes (Signed)
 Ortho tech called about brace placement

## 2024-05-23 NOTE — ED Notes (Signed)
 Awaiting meds to get verified

## 2024-05-23 NOTE — ED Notes (Signed)
 Tech bladder

## 2024-05-23 NOTE — ED Notes (Signed)
 C-collar replaced by PA

## 2024-05-23 NOTE — ED Notes (Signed)
 Confirmed with Dann MD okay to sit pt up 20 degrees for PO med admin

## 2024-05-23 NOTE — Progress Notes (Signed)
 Orthopedic Tech Progress Note Patient Details:  Alexandria Harvey [birth year] [id number]  Called in order to [employer] for a CTO BRACE for patient    Patient ID: Alexandria Harvey, female   DOB: 10-29-1980, 42 y.o.   MRN: [id number]  Alexandria Harvey Pac 05/23/2024, 2:13 PM

## 2024-05-23 NOTE — ED Notes (Signed)
 Awaiting ENT cart remains at bedside with medications on top

## 2024-05-23 NOTE — ED Notes (Signed)
 Upgraded to level 2 trauma by Lavanda [facility] and MD [facility].

## 2024-05-23 NOTE — ED Notes (Signed)
 Trauma Response Nurse Documentation   Alexandria Harvey is a 43 y.o. female arriving to [facility] ED via EMS  On No antithrombotic. Trauma was activated as a Level 2 by ED PA, Lavanda Lesches. based on the following trauma criteria Paralysis associated with trauma,.  Patient cleared for CT by Dr. Ula. Pt transported to CT with trauma response nurse present to monitor. RN remained with the patient throughout their absence from the department for clinical observation.   GCS 14 (baseline).  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 Poor dental hygiene but no loose or missing teeth.  Breathing: Breath sounds clear, equal bilaterally.  Circulation: Large forehead laceration (approx 10cm), bleeding currently controlled. Pulses intact centrally and peripherally. Manual BP obtained.  Disability: Pt at baseline mentation per case worker at bedside.  PERRLA. Unable to move upper extremities; unable to squeeze hands. Numbness and tingling to upper extremities.  Able to wiggle toes bilaterally.  Reports some dullness to RLE but inconsistent.  C-collar in place but replaced with [place] J in ED.   CT's Completed:   CT Head, CT C-Spine, and CT abdomen/pelvis w/ contrast T-spine and L-spine no charge.  Interventions:  *20G PIV to R hand *20G PIG to L wrist *trauma labs obtained  *Manual BP obtained  *Fentanyl  given *CXR *CT pan scan *Pt undressed and assessed thoroughly *[place] c-collar placed on pt.  *Keeping pt  flat / logroll only maintaining c-spine precautions   Plan for disposition:  Other - awaiting neurosurgical plan - probable admit to neurosurg.  Consults completed:  Neurosurgeon Dr Lanis and Camie Pickle, PA paged at 1021.  Event Summary: Pt was BIB [facility] after sustaining a fall down approx 4-5 steps this morning.  Pt is from a group home and has baseline cognitive deficits. Pt states that she recalls missing a step and hitting her head on a wooden platform, hyperextending her neck. No LOC reported.  C-collar was placed by FD. VS WDL en route.  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 Pt was taken to CT and labs were bypassed due to benefits outweighing risks and critical need for CT.   Media Information   Document Information  Photos  Forehead lac  05/23/2024 10:17  Attached To:  Hospital Encounter on 05/23/24  Source Information  Lebron Rockie ORN, RN  [facility]   MTP Summary (If applicable):   Bedside handoff with ED RN Chiquita EMERSON LEBRON, ROCKIE ORN  Trauma Response RN  Please call TRN at [phone] for further assistance.

## 2024-05-23 NOTE — ED Provider Notes (Signed)
 [facility] AT [facility] Provider Note   CSN: [id number] Arrival date & time: 05/23/24  9082     Patient presents with: Alexandria Harvey is a 43 y.o. female with a past medical history of intellectual disability schizophrenia and diabetes who arrives via EMS for fall.  History is gathered at bedside by EMS, the patient and also by her guardian who arrived later.  Patient has a known fractured left foot or ankle and is in a cam walker.  She apparently lost her footing today, fell forward hitting her face on the ledge of a wooden platform.  She had an extensive laceration that was wrapped prior to arrival.  According to her caregiver and guardian she is up-to-date on her tetanus vaccine.  The patient is oriented per her baseline.  She is complaining of severe pain in her neck, numbness in her upper extremities.  EMS reports that she has been unable to move her upper extremities however does have movement in the lower extremities.  Patient also complaining of headache and abdominal pain. Patient initially arrived without leveling however I opted to make this a level 2 trauma.  ATLS protocol utilized.   Fall       Prior to Admission medications   Medication Sig Start Date End Date Taking? Authorizing Provider  acetaminophen  (TYLENOL ) 650 MG CR tablet Take 650 mg by mouth 3 (three) times daily as needed for pain.    [provider]  atenolol  (TENORMIN ) 50 MG tablet Take 1 tablet (50 mg total) by mouth daily. Patient taking differently: Take 50 mg by mouth daily with breakfast.  01/25/15   Ragena Odor, MD  atorvastatin  (LIPITOR ) 80 MG tablet Take 1 tablet (80 mg total) by mouth at bedtime. 01/25/15   Ragena Odor, MD  benztropine  (COGENTIN ) 1 MG tablet Take 1 mg by mouth 2 (two) times daily.    [provider]  Blood Glucose Monitoring Suppl (ONE TOUCH ULTRA 2) w/Device KIT by Does not apply route. Use to check  blood sugar as directed.    [provider]  cloZAPine  (CLOZARIL ) 100 MG tablet Take 100 mg by mouth 2 (two) times daily.     [provider]  dicyclomine  (BENTYL ) 10 MG capsule Take 1 capsule (10 mg total) by mouth every 8 (eight) hours as needed for spasms. 06/29/17   Armbruster, Elspeth SQUIBB, MD  divalproex  (DEPAKOTE ) 500 MG DR tablet Take 1 tablet (500 mg total) by mouth 3 (three) times daily. Patient taking differently: Take 1,000 mg by mouth See admin instructions. Take 2 tablets (1000 mg) by mouth twice daily - 7:30am and 4pm 01/25/15   Ragena Odor, MD  EPINEPHrine  0.3 mg/0.3 mL IJ SOAJ injection Inject 0.3 mg into the muscle once as needed for anaphylaxis (severe allergic reaction).     [provider]  ergocalciferol  (VITAMIN D2) 1.25 MG (50000 UT) capsule TAKE 1 CAPSULE BY MOUTH TWICE A WEEK ON TUESDAY AND FRIDAY. 10/19/19   [provider]  ferrous sulfate  325 (65 FE) MG tablet Take 1 tablet (325 mg total) by mouth daily with breakfast. 09/15/19 10/25/19  Verdene Purchase, MD  lisinopril  (PRINIVIL ,ZESTRIL ) 2.5 MG tablet Take 1 tablet (2.5 mg total) by mouth daily. Patient taking differently: Take 2.5 mg by mouth daily with breakfast.  01/25/15   Ragena Odor, MD  LORazepam  (ATIVAN ) 0.5 MG tablet Take 0.5 mg by mouth See admin instructions. Take one tablet (0.5 mg) by mouth one hour  prior to lab, ekg, dental appointments, etc.    [provider]  lubiprostone  (AMITIZA ) 24 MCG capsule Take 24 mcg by mouth daily with breakfast.     [provider]  medroxyPROGESTERone (DEPO-PROVERA) 150 MG/ML injection Inject 150 mg into the muscle every 3 (three) months.    [provider]  metFORMIN  (GLUCOPHAGE -XR) 500 MG 24 hr tablet Take 1,000 mg by mouth daily with breakfast.    [provider]  nystatin  cream (MYCOSTATIN ) Apply 1 application topically See admin instructions. Apply topically to genital area twice  daily as needed for rash    [provider]  omeprazole (PRILOSEC) 20 MG capsule Take 20 mg by mouth daily with breakfast.     [provider]  polyethylene glycol (MIRALAX  / GLYCOLAX ) packet Take 17 g by mouth 2 (two) times daily. Patient taking differently: Take 17 g by mouth 2 (two) times daily. Mix in 4-8 oz liquid of choice and drink 06/29/17   Armbruster, Elspeth SQUIBB, MD  simethicone  (MYLICON) 80 MG chewable tablet Chew 80 mg by mouth 2 (two) times daily.    [provider]  Vitamin D , Ergocalciferol , (DRISDOL ) 50000 units CAPS capsule Take 50,000 Units by mouth See admin instructions. Take one capsule (50,000 units) by mouth twice weekly - on Tuesday and Friday    [provider]    Allergies: Haloperidol lactate, Shrimp [shellfish allergy], and Strawberry (diagnostic)    Review of Systems  Updated Vital Signs BP (!) 104/90 (BP Location: Left Arm)   Pulse 100   Temp 98.3 F (36.8 C) (Oral)   Resp 13   Ht 5' 2.5 (1.588 m)   SpO2 100%   BMI 27.72 kg/m   Physical Exam Vitals and nursing note reviewed.  Constitutional:      Appearance: She is obese.     Comments: Patient arrives on cervical spine precautions with c-collar in place and SCoop stretcher.  HENT:     Head: Normocephalic.      Comments: Large laceration of the forehead extending from the medial right eyebrow through to the hairline.  Bleeding is controlled at this time.    Nose:     Comments: No epistaxis    Mouth/Throat:     Mouth: Mucous membranes are moist.  Eyes:     General: Lids are normal.     Extraocular Movements: Extraocular movements intact.     Pupils: Pupils are equal, round, and reactive to light.     Comments: Oozing around the right eyebrow and right eye No nasal deformity or facial deformities. No malocclusion or tooth subluxations or avulsion ends  Neck:     Comments: C-collar in place.  Midline tenderness noted on palpation, spinal precautions maintained  throughout evaluation Cardiovascular:     Rate and Rhythm: Normal rate and regular rhythm.  Pulmonary:     Effort: Pulmonary effort is normal.     Breath sounds: Normal breath sounds. No wheezing.     Comments: No chest wall tenderness or crepitus Abdominal:     Palpations: Abdomen is soft.     Comments: No notable abdominal tenderness or bruising, Pelvis is stable to pressure  Musculoskeletal:     Comments: Multiple abrasions to the right arm and right lower extremity, no obvious deformities of the extremities.  There is some swelling of the left lower extremity after cam boot was removed with known previous fracture 2 weeks ago No obvious thoracic or lumbar midline tenderness, normal rectal tone  Neurological:  Mental Status: She is alert.     GCS: GCS eye subscore is 4. GCS verbal subscore is 5. GCS motor subscore is 6.     Cranial Nerves: Cranial nerves 2-12 are intact.     Sensory: Sensory deficit present.     Motor: Weakness present.     Comments: Normal sensation and movement of the lower extremities, able to to both legs off of the stretcher. She has no ability to move the upper extremities and is complaining of numbness but has normal sensation to pain and light touch.  Psychiatric:        Behavior: Behavior is cooperative.     (all labs ordered are listed, but only abnormal results are displayed) Labs Reviewed  CBC WITH DIFFERENTIAL/PLATELET - Abnormal; Notable for the following components:      Result Value   WBC 13.2 (*)    RDW 17.2 (*)    Neutro Abs 9.0 (*)    Abs Immature Granulocytes 0.14 (*)    All other components within normal limits  COMPREHENSIVE METABOLIC PANEL WITH GFR - Abnormal; Notable for the following components:   Sodium 134 (*)    CO2 17 (*)    Glucose, Bld 148 (*)    Calcium  8.3 (*)    Total Protein 5.2 (*)    Albumin 2.5 (*)    All other components within normal limits  URINALYSIS, ROUTINE W REFLEX MICROSCOPIC - Abnormal; Notable for the  following components:   Specific Gravity, Urine >1.046 (*)    Ketones, ur 5 (*)    All other components within normal limits  HEMOGLOBIN A1C - Abnormal; Notable for the following components:   Hgb A1c MFr Bld 7.0 (*)    All other components within normal limits  I-STAT CHEM 8, ED - Abnormal; Notable for the following components:   Glucose, Bld 172 (*)    Calcium , Ion 1.09 (*)    TCO2 18 (*)    All other components within normal limits  CBG MONITORING, ED - Abnormal; Notable for the following components:   Glucose-Capillary 142 (*)    All other components within normal limits  PROTIME-INR  HIV ANTIBODY (ROUTINE TESTING W REFLEX)  TYPE AND SCREEN    EKG: None  Radiology: No results found.   .Critical Care  Performed by: Arloa Chroman, PA-C Authorized by: Arloa Chroman, PA-C   Critical care provider statement:    Critical care time (minutes):  80   Critical care time was exclusive of:  Separately billable procedures and treating other patients   Critical care was necessary to treat or prevent imminent or life-threatening deterioration of the following conditions:  Trauma   Critical care was time spent personally by me on the following activities:  Development of treatment plan with patient or surrogate, discussions with consultants, evaluation of patient's response to treatment, examination of patient, ordering and review of laboratory studies, ordering and review of radiographic studies, ordering and performing treatments and interventions, pulse oximetry, re-evaluation of patient's condition, review of old charts, vascular access procedures, interpretation of cardiac output measurements and obtaining history from patient or surrogate    Medications Ordered in the ED  fentaNYL  (SUBLIMAZE ) injection 50 mcg (50 mcg Intravenous Given 05/23/24 0944)  iohexol  (OMNIPAQUE ) 350 MG/ML injection 75 mL (75 mLs Intravenous Contrast Given 05/23/24 1014)    Clinical Course as of 05/23/24  1641  Tue May 23, 2024  1016 Labs bypassed due to acuity- discussed with guardian at bedisde who agrees [AH]  1029 I visualized and interpreted CT C-spine.  It appears that the patient has multiple spinous process fractures at C3, C5 and C7. [AH]  1039 Case discussed with PA Lauraine Pickle with neurosurgery.  She is recommending MRI of the cervical spine however will wait for [AH]  1039 CT CHEST ABDOMEN PELVIS W CONTRAST [AH]  1039 CT Cervical Spine Wo Contrast [AH]  1039 CT Head Wo Contrast [AH]  1117 Patient also has a T T4 Chance fracture on CT reformatting.  Have added on an MRI of the thoracic spine. [AH]  1337 MRI findings as follows:1. Multiple acute fractures involving the posterior elements at C3, C5 and C7 as correlated with earlier CT. Associated paraspinal edema which could reflect associated ligamentous injury. No evidence of acute cervical vertebral fracture or traumatic subluxation. 2. Subtle cord T2 hyperintensity at C3-4, potentially reflecting mild cord contusion. No cervical cord compression, cord hemorrhage or epidural hematoma. 3. Acute fractures of T4, T5 and T6 vertebral bodies. T4 fracture extends into the posterior elements, consistent with a Chance fracture. Mild osseous retropulsion on the right at T4 with mild mass effect on the thoracic cord. No significant foraminal narrowing or epidural hematoma. 4. Subcutaneous hematoma superficial to the ligamentum nuchae extending from C6 to the T2 level. 5. Mild thoracic spondylosis as described.   [AH]    Clinical Course User Index [AH] Arloa Chroman, PA-C                                 Medical Decision Making This patient presents to the ED for concern of traumatic injury, this involves an extensive number of treatment options, and is a complaint that carries with it a high risk of complications and morbidity.   The emergent differential diagnosis for trauma is extensive and requires complex medical decision  making. The differential includes, but is not limited to traumatic brain injury, Orbital trauma, maxillofacial trauma, skull fracture, blunt/penetrating neck trauma, vertebral artery dissection, whiplash, cervical fracture, neurogenic shock, spinal cord injury, thoracic trauma (blunt/penetrating) cardiac trauma, thoracic and lumbar spine trauma. Abdominal trauma (blunt. Penetrating), genitourinary trauma, extremity fractures, skin lacerations/ abrasions, vascular injuries.    Co morbidities:      Diabetes, intellectual disability, known left ankle fracture, schizophrenia  Social Determinants of Health:       Lives in a group home  Additional history:  {Additional history obtained as per ED course and by review of EMR   Lab Tests:  I Ordered, and personally interpreted labs.  The pertinent results include:   CBG shows glucose 142, Chem-8 shows normal renal function   Imaging Studies:  I ordered imaging studies including bedside chest x-ray, CT head, C-spine, CT chest abdomen and pelvis bedside bladder scan I independently visualized and interpreted imaging which showed wet read of the chest x-ray showed no pneumothorax free air or rib fractures,  I agree with the radiologist interpretation    Medicines ordered and prescription drug management:  I ordered medication including Medications fentaNYL  (SUBLIMAZE ) injection 50 mcg (50 mcg Intravenous Given 05/23/24 0944) iohexol  (OMNIPAQUE ) 350 MG/ML injection 75 mL (75 mLs Intravenous Contrast Given 05/23/24 1014) for for pain Reevaluation of the patient after these medicines showed that the patient improved I have reviewed the patients home medicines and have made adjustments as needed  Test Considered:         Critical Interventions:       Spinal precautions, multiple doses  of pain medication  Consultations Obtained:   Problem List / ED Course:    Trauma   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  Patient will be admitted by trauma with consulting services including Dr. Phylliss Reddish with ENT for laceration repair and neurosurgery for management of spinal cord injury and fractures.  Pain improved with pain control.  Patient's mother and guardian updated at bedside and via phone.   Dispostion:  After consideration of the diagnostic results and the patients response to treatment, I feel that the patent would benefit from admit.    Amount and/or Complexity of Data Reviewed Labs: ordered.    Details: Per ED course Radiology: ordered and independent interpretation performed. Decision-making details documented in ED Course.    Details: As per ED course  Risk Prescription drug management. Parenteral controlled substances. Decision regarding hospitalization.        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

## 2024-05-23 NOTE — Consult Note (Signed)
 HPI:     Patient is a 43 y.o. female with a PMH of intellectual disability, T2DM, schizophrenia, HTN, HLD who presented to [facility] ED after a fall down the last 3-4 stairs onto a wooden platform at the bottom this morning. Per her caregiver, she fell directly onto her face. She has a recent h/o L ankle/foot fracture. Currently c/o neck pain and back pain. Also with difficulty moving UEs. Initial workup showing cervical fractures for which NSGY consulted.     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   Agitation    Moderate intellectual disabilities 01/25/2015   Schizoaffective disorder, depressive type (HCC) 01/18/2015   Type 2 diabetes mellitus without complication (HCC)    Hypoglycemia 11/30/2014   Allergic rhinitis 12/19/2009   DYSPEPSIA 09/26/2009   Constipation 09/26/2009   TINEA PEDIS 12/17/2008   HYPERLIPIDEMIA 01/08/2007   Essential hypertension 01/08/2007   Seizure disorder (HCC) 01/08/2007   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   Excessive anger    GERD (gastroesophageal reflux disease)    Hallucinations    Hyperlipidemia    Hypertension    Mild mental retardation    Schizophrenia (HCC)    Seizures (HCC)    Vitamin D  deficiency     Past Surgical History:  Procedure Laterality Date   NO PAST SURGERIES      (Not in a hospital admission)  Allergies  Allergen Reactions   Haloperidol Lactate Other (See Comments)    Unknown reaction   Shrimp [Shellfish Allergy] Hives and Rash   Strawberry (Diagnostic) Rash    Social History    Tobacco Use   Smoking status: Never   Smokeless tobacco: Never  Substance Use Topics   Alcohol use: No    Family History  Problem Relation Age of Onset   Diabetes Mellitus II Mother    Cancer Father      Objective:   Patient Vitals for the past 8 hrs:  BP Temp Temp src Pulse Resp SpO2 Height Weight  05/23/24 1331 -- 98.7 F (37.1 C) Oral -- -- -- -- --  05/23/24 1145 -- -- -- (!) 101 13 100 % -- --  05/23/24 1130 (!) 102/53 -- -- 100 14 100 % -- --  05/23/24 0925 (!) 104/90 -- -- -- -- -- -- --  05/23/24 0924 -- -- -- -- -- -- 5' 2.5 (1.588 m) --  05/23/24 0921 (!) 106/90 98.3 F (36.8 C) Oral 100 13 100 % -- --  05/23/24 0920 -- -- -- -- -- 98 % 5' 2.5 (1.588 m) 69.9 kg   No intake/output data recorded. No intake/output data recorded.  MR CERVICAL SPINE WO CONTRAST Result Date: 05/23/2024 CLINICAL DATA:  Fall down stairs. Multiple fractures of the posterior elements of the cervical spine and T4 and T5 vertebral bodies on CT. EXAM: MRI CERVICAL AND THORACIC SPINE WITHOUT CONTRAST TECHNIQUE: Multiplanar and multiecho pulse sequences of the cervical spine, to include the craniocervical junction and cervicothoracic junction, and the thoracic spine, were obtained without intravenous contrast. COMPARISON:  Same day CT of the cervical  and thoracic spine. MRI of the cervical spine 04/09/2020. FINDINGS: MRI CERVICAL SPINE FINDINGS Alignment: Normal. Vertebrae: As seen on CT, there are multiple acute fractures involving the posterior elements at C3, C5 and C7 with associated marrow and surrounding soft tissue edema. No evidence of acute cervical vertebral fracture or traumatic subluxation. There are mild chronic healed superior endplate compression deformities at T1 and T2 which are unchanged from previous MRI. Cord: Subtle cord T2 hyperintensity at C3-4, potentially reflecting mild cord contusion. There is no cord compression or hemorrhage. The cervical cord is otherwise normal in signal  and caliber. No evidence of epidural hematoma. Posterior Fossa, vertebral arteries, paraspinal tissues: Visualized portions of the posterior fossa appear unremarkable.Bilateral vertebral artery flow voids. Small amount of prevertebral soft tissue edema. As above, there is irregular edema/hemorrhage surrounding the spinous process fractures, especially at C5. In addition, there is a probable subcutaneous hematoma superficial to the ligamentum nuchae and extending from C6-T2, measuring 7.7 cm in length on sagittal image 9/3. Disc levels: C2-3: As correlated with CT, there is interbody and interfacetal ankylosis. No significant spinal stenosis or foraminal narrowing. C3-4: Mild disc bulging. Mild spinal stenosis without cord deformity. No significant foraminal narrowing. C4-5: Small central disc protrusion. No spinal stenosis or significant foraminal narrowing. C5-6: Small central disc protrusion. No spinal stenosis or significant foraminal narrowing. C6-7: Small central disc protrusion. No spinal stenosis or significant foraminal narrowing. C7-T1: Mild facet hypertrophy. No spinal stenosis or significant foraminal narrowing. MRI THORACIC SPINE FINDINGS Technical note: Despite efforts by the technologist and patient, mild motion artifact is present on today's exam and could not be eliminated. This reduces exam sensitivity and specificity. Alignment:  Physiologic. Vertebrae: As demonstrated on CT, there are acute minimally displaced fractures of the T4 and T5 vertebral bodies. The T4 fracture extends into the right facet joint and spinous process on CT, consistent with a Chance fracture. There is mild osseous retropulsion on the right by approximately 3 mm, best seen on the axial images. There is associated marrow and surrounding soft tissue edema. In addition, there is a nondisplaced fracture involving the posterosuperior corner of the T6 vertebral body, best seen on the sagittal images. No acute osseous findings  within the lower thoracic spine. Cord: The thoracic cord appears normal in signal and caliber.The conus medullaris extends to the T12-L1 disc space level. Paraspinal and other soft tissues: Mild paraspinous edema/hemorrhage posteriorly adjacent to the T4 spinous process fracture. As above, subcutaneous hematoma superficial to the ligamentum nuchae extending inferiorly to the T2 level. Disc levels: No significant disc space findings in the upper thoracic spine. T4-5: Mild osseous retropulsion on the right with mild mass effect on the thoracic cord, best seen on the axial images. No significant foraminal narrowing, epidural hematoma or disc herniation. T5-6: No significant findings. T6-7: Loss of disc height with a small partially calcified central disc protrusion. Mild mass effect on the ventral aspect of the cord. There is ample CSF posteriorly. T7-8: Mild disc bulging with anterior osteophytes. No spinal stenosis or significant foraminal narrowing. T8-9: Mild disc bulging with a small calcified superiorly extruded disc fragment. No cord deformity or significant foraminal narrowing. T9-10: No spinal stenosis or significant foraminal narrowing. T10-11: Mild facet hypertrophy. No spinal stenosis or significant foraminal narrowing. T11-12: Mild facet hypertrophy. No spinal stenosis or significant foraminal narrowing. IMPRESSION: 1. Multiple acute fractures involving the posterior elements at C3, C5 and C7 as correlated with earlier CT. Associated paraspinal edema which could reflect associated ligamentous injury. No  evidence of acute cervical vertebral fracture or traumatic subluxation. 2. Subtle cord T2 hyperintensity at C3-4, potentially reflecting mild cord contusion. No cervical cord compression, cord hemorrhage or epidural hematoma. 3. Acute fractures of T4, T5 and T6 vertebral bodies. T4 fracture extends into the posterior elements, consistent with a Chance fracture. Mild osseous retropulsion on the right at T4  with mild mass effect on the thoracic cord. No significant foraminal narrowing or epidural hematoma. 4. Subcutaneous hematoma superficial to the ligamentum nuchae extending from C6 to the T2 level. 5. Mild thoracic spondylosis as described. Electronically Signed   By: Elsie Perone M.D.   On: 05/23/2024 13:30   MR THORACIC SPINE WO CONTRAST Result Date: 05/23/2024 CLINICAL DATA:  Fall down stairs. Multiple fractures of the posterior elements of the cervical spine and T4 and T5 vertebral bodies on CT. EXAM: MRI CERVICAL AND THORACIC SPINE WITHOUT CONTRAST TECHNIQUE: Multiplanar and multiecho pulse sequences of the cervical spine, to include the craniocervical junction and cervicothoracic junction, and the thoracic spine, were obtained without intravenous contrast. COMPARISON:  Same day CT of the cervical and thoracic spine. MRI of the cervical spine 04/09/2020. FINDINGS: MRI CERVICAL SPINE FINDINGS Alignment: Normal. Vertebrae: As seen on CT, there are multiple acute fractures involving the posterior elements at C3, C5 and C7 with associated marrow and surrounding soft tissue edema. No evidence of acute cervical vertebral fracture or traumatic subluxation. There are mild chronic healed superior endplate compression deformities at T1 and T2 which are unchanged from previous MRI. Cord: Subtle cord T2 hyperintensity at C3-4, potentially reflecting mild cord contusion. There is no cord compression or hemorrhage. The cervical cord is otherwise normal in signal and caliber. No evidence of epidural hematoma. Posterior Fossa, vertebral arteries, paraspinal tissues: Visualized portions of the posterior fossa appear unremarkable.Bilateral vertebral artery flow voids. Small amount of prevertebral soft tissue edema. As above, there is irregular edema/hemorrhage surrounding the spinous process fractures, especially at C5. In addition, there is a probable subcutaneous hematoma superficial to the ligamentum nuchae and  extending from C6-T2, measuring 7.7 cm in length on sagittal image 9/3. Disc levels: C2-3: As correlated with CT, there is interbody and interfacetal ankylosis. No significant spinal stenosis or foraminal narrowing. C3-4: Mild disc bulging. Mild spinal stenosis without cord deformity. No significant foraminal narrowing. C4-5: Small central disc protrusion. No spinal stenosis or significant foraminal narrowing. C5-6: Small central disc protrusion. No spinal stenosis or significant foraminal narrowing. C6-7: Small central disc protrusion. No spinal stenosis or significant foraminal narrowing. C7-T1: Mild facet hypertrophy. No spinal stenosis or significant foraminal narrowing. MRI THORACIC SPINE FINDINGS Technical note: Despite efforts by the technologist and patient, mild motion artifact is present on today's exam and could not be eliminated. This reduces exam sensitivity and specificity. Alignment:  Physiologic. Vertebrae: As demonstrated on CT, there are acute minimally displaced fractures of the T4 and T5 vertebral bodies. The T4 fracture extends into the right facet joint and spinous process on CT, consistent with a Chance fracture. There is mild osseous retropulsion on the right by approximately 3 mm, best seen on the axial images. There is associated marrow and surrounding soft tissue edema. In addition, there is a nondisplaced fracture involving the posterosuperior corner of the T6 vertebral body, best seen on the sagittal images. No acute osseous findings within the lower thoracic spine. Cord: The thoracic cord appears normal in signal and caliber.The conus medullaris extends to the T12-L1 disc space level. Paraspinal and other soft tissues: Mild paraspinous edema/hemorrhage posteriorly adjacent  to the T4 spinous process fracture. As above, subcutaneous hematoma superficial to the ligamentum nuchae extending inferiorly to the T2 level. Disc levels: No significant disc space findings in the upper thoracic  spine. T4-5: Mild osseous retropulsion on the right with mild mass effect on the thoracic cord, best seen on the axial images. No significant foraminal narrowing, epidural hematoma or disc herniation. T5-6: No significant findings. T6-7: Loss of disc height with a small partially calcified central disc protrusion. Mild mass effect on the ventral aspect of the cord. There is ample CSF posteriorly. T7-8: Mild disc bulging with anterior osteophytes. No spinal stenosis or significant foraminal narrowing. T8-9: Mild disc bulging with a small calcified superiorly extruded disc fragment. No cord deformity or significant foraminal narrowing. T9-10: No spinal stenosis or significant foraminal narrowing. T10-11: Mild facet hypertrophy. No spinal stenosis or significant foraminal narrowing. T11-12: Mild facet hypertrophy. No spinal stenosis or significant foraminal narrowing. IMPRESSION: 1. Multiple acute fractures involving the posterior elements at C3, C5 and C7 as correlated with earlier CT. Associated paraspinal edema which could reflect associated ligamentous injury. No evidence of acute cervical vertebral fracture or traumatic subluxation. 2. Subtle cord T2 hyperintensity at C3-4, potentially reflecting mild cord contusion. No cervical cord compression, cord hemorrhage or epidural hematoma. 3. Acute fractures of T4, T5 and T6 vertebral bodies. T4 fracture extends into the posterior elements, consistent with a Chance fracture. Mild osseous retropulsion on the right at T4 with mild mass effect on the thoracic cord. No significant foraminal narrowing or epidural hematoma. 4. Subcutaneous hematoma superficial to the ligamentum nuchae extending from C6 to the T2 level. 5. Mild thoracic spondylosis as described. Electronically Signed   By: Elsie Perone M.D.   On: 05/23/2024 13:30   CT T-SPINE NO CHARGE Result Date: 05/23/2024 CLINICAL DATA:  Fall down stairs. Neck and back pain. Cervical spine fractures. EXAM: CT  Thoracic Spine without contrast TECHNIQUE: Multiplanar CT images of the thoracic spine were reconstructed from contemporary CT of the Chest. RADIATION DOSE REDUCTION: This exam was performed according to the departmental dose-optimization program which includes automated exposure control, adjustment of the mA and/or kV according to patient size and/or use of iterative reconstruction technique. CONTRAST:  No additional COMPARISON:  Same day cervical spine CT. Thoracic spine radiographs 02/07/2019. Chest CT 167183. FINDINGS: Alignment: Normal. Vertebrae: In addition to the separately reported cervical spine injuries, there are acute minimally displaced fractures of the T4 and T5 vertebral bodies. Minimal osseous retropulsion at T4. Fracture extends into the inferior right T4 articulating facet and spinous process. No significant loss of vertebral body height. No other fractures are identified. Paraspinal and other soft tissues: Mild anterior paraspinal soft tissue edema at T4 and T5. No focal hematoma. Additional chest details dictated separately. Disc levels: Mild multilevel thoracic spondylosis with disc bulging, small disc protrusions and endplate osteophytes. IMPRESSION: 1. Acute minimally displaced fractures of the T4 and T5 vertebral bodies with extension into the inferior right T4 articulating facet and spinous process. Minimal osseous retropulsion at T4. The T4 fracture is best categorized as a Chance fracture, potentially unstable. 2. No other acute osseous findings. 3. Mild thoracic spondylosis. 4. Additional chest details dictated separately. See separate cervical spine CT report. 5. These results were called by telephone at the time of interpretation on 05/23/2024 at 11:14 am to provider Prentice Medicus, who verbally acknowledged these results. Electronically Signed   By: Elsie Perone M.D.   On: 05/23/2024 11:14   CT CHEST ABDOMEN PELVIS W CONTRAST  Result Date: 05/23/2024 EXAM: CT CHEST, ABDOMEN AND PELVIS  WITH CONTRAST 05/23/2024 10:13:43 AM TECHNIQUE: CT of the chest, abdomen and pelvis was performed with the administration of intravenous contrast. Multiplanar reformatted images are provided for review. Automated exposure control, iterative reconstruction, and/or weight based adjustment of the mA/kV was utilized to reduce the radiation dose to as low as reasonably achievable. COMPARISON: CT of the chest dated 12/03/2014. CLINICAL HISTORY: Trauma. Triage notes; Patient arrives via [facility] for fall down 4-5 steps. Patient with lac to middle of head, right knee abrasion, and abrasion to right forearm. Patient endorses missing a step coming steps at her home. Patient arrives in c-collar placed by fire. Bleeding controlled at time of arrival. FINDINGS: CHEST: MEDIASTINUM AND LYMPH NODES: Heart and pericardium are unremarkable. The central airways are clear. No mediastinal, hilar or axillary lymphadenopathy. LUNGS AND PLEURA: No focal consolidation or pulmonary edema. No pleural effusion or pneumothorax. ABDOMEN AND PELVIS: LIVER: The liver is unremarkable. GALLBLADDER AND BILE DUCTS: Gallbladder is unremarkable. No biliary ductal dilatation. SPLEEN: No acute abnormality. PANCREAS: No acute abnormality. ADRENAL GLANDS: No acute abnormality. KIDNEYS, URETERS AND BLADDER: No stones in the kidneys or ureters. No hydronephrosis. No perinephric or periureteral stranding. Urinary bladder is unremarkable. GI AND BOWEL: Stomach demonstrates no acute abnormality. There is no bowel obstruction. REPRODUCTIVE ORGANS: No acute abnormality. PERITONEUM AND RETROPERITONEUM: No ascites. No free air. VASCULATURE: Aorta is normal in caliber. ABDOMINAL AND PELVIS LYMPH NODES: No lymphadenopathy. REPRODUCTIVE ORGANS: No acute abnormality. BONES AND SOFT TISSUES: Chronic bilateral spondylolysis and slight anterolisthesis present at L5-S1. Healed fracture deformities of the left posterior rib cage. No acute fractures evident. IMPRESSION: 1. No  acute abnormality of the chest, abdomen, and pelvis related to the provided clinical history of trauma. Electronically signed by: Evalene Coho MD 05/23/2024 10:36 AM EDT RP Workstation: [id number]   DG Chest Portable 1 View Result Date: 05/23/2024 EXAM: 1 VIEW XRAY OF THE CHEST 05/23/2024 10:15:00 AM COMPARISON: Radiograph of the chest dated 09/10/2019. CLINICAL HISTORY: Trauma. Level 2 trauma- Fall. FINDINGS: LUNGS AND PLEURA: Low lung volumes. No focal pulmonary opacity. No pulmonary edema. No pleural effusion. No pneumothorax. HEART AND MEDIASTINUM: No acute abnormality of the cardiac and mediastinal silhouettes. BONES AND SOFT TISSUES: Degenerative changes of right shoulder. No acute osseous abnormality. IMPRESSION: 1. No acute process. 2. Low lung volumes. Electronically signed by: Evalene Coho MD 05/23/2024 10:31 AM EDT RP Workstation: [id number]   CT Cervical Spine Wo Contrast Result Date: 05/23/2024 CLINICAL DATA:  Fall down multiple steps. Head laceration and neck pain. EXAM: CT CERVICAL SPINE WITHOUT CONTRAST TECHNIQUE: Multidetector CT imaging of the cervical spine was performed without intravenous contrast. Multiplanar CT image reconstructions were also generated. RADIATION DOSE REDUCTION: This exam was performed according to the departmental dose-optimization program which includes automated exposure control, adjustment of the mA and/or kV according to patient size and/or use of iterative reconstruction technique. COMPARISON:  CT cervical spine 09/08/2019. MRI cervical spine 04/09/2020. FINDINGS: Alignment: Normal. Skull base and vertebrae: There are multiple acute cervical spine fractures. There are nondisplaced fractures involving the base of the C3 spinous process and left lamina. There is partial interbody and interfacetal ankylosis at C2-3. There is a comminuted and mildly displaced fracture of the C5 spinous process and right lamina. There is a mildly displaced fracture of the C7  spinous process. No definite involvement of the facet joints or traumatic subluxation. No evidence of vertebral body fracture. Soft tissues and spinal canal: No prevertebral fluid or swelling. No  visible canal hematoma. There is posterior paraspinal soft tissue swelling adjacent to the fractures of the posterior elements. Disc levels: The disc heights are relatively maintained. There are paraspinal osteophytes which are most prominent anteriorly at C4-5 and C5-6. As above, partial interbody and interfacetal ankylosis at C2-3. No evidence of large disc herniation or high-grade foraminal narrowing. Upper chest: Chest findings dictated separately. Other: Carotid atherosclerosis. IMPRESSION: 1. Multiple acute cervical spine fractures involving the C3, C5 and C7 posterior elements as described. No evidence of traumatic subluxation. 2. No evidence of vertebral body fracture. 3. No evidence of large disc herniation or high-grade foraminal narrowing. 4. Critical Value/emergent results were called by telephone at the time of interpretation on 05/23/2024 at 10:30 am to provider ANDREW TEE , who verbally acknowledged these results. Electronically Signed   By: Elsie Perone M.D.   On: 05/23/2024 10:31   CT Head Wo Contrast Result Date: 05/23/2024 EXAM: CT HEAD WITHOUT CONTRAST 05/23/2024 10:13:43 AM TECHNIQUE: CT of the head was performed without the administration of intravenous contrast. Automated exposure control, iterative reconstruction, and/or weight based adjustment of the mA/kV was utilized to reduce the radiation dose to as low as reasonably achievable. COMPARISON: 09/08/2019 CLINICAL HISTORY: Trauma. Patient arrives via [facility] for fall down 4-5 steps. Patient with lac to middle of head, right knee abrasion, and abrasion to right forearm. Patient endorses missing a step coming steps at her home. Patient arrives in c-collar placed by fire. Bleeding controlled at time of arrival. Patient having trouble moving fingers  and toes, endorses head and neck pain. CBG 188. BP 102/60, HR 104, 98 on room air. FINDINGS: BRAIN AND VENTRICLES: No acute hemorrhage. No evidence of acute infarct. No hydrocephalus. No extra-axial collection. No mass effect or midline shift. Moderate generalized cerebral volume loss present, which is advanced for the patient's age. Mild periventricular white matter disease. ORBITS: No acute abnormality. SINUSES: No acute abnormality. SOFT TISSUES AND SKULL: Right forehead laceration and soft tissue swelling. No skull fracture. IMPRESSION: 1. No acute intracranial abnormality. 2. Moderate generalized cerebral volume loss, advanced for the patient's age. 3. Mild periventricular white matter disease. 4. Right forehead laceration and soft tissue swelling. Electronically signed by: Evalene Coho MD 05/23/2024 10:30 AM EDT RP Workstation: [id number]   Awake, alert. Oriented to person.  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   -T4,5,6 fractures with involvement of posterior elements at T4 without significant bony retropulsion, malalignment, subluxation  Plan:   -Cervicothoracic orthosis at all times -Ok to participate in therapies as tolerated -Ok for diet from NSGY standpoint for now.   Vernadette Stutsman CAYLIN Jelene Albano, PA-C

## 2024-05-23 NOTE — ED Notes (Signed)
 Patient transported to MRI

## 2024-05-23 NOTE — ED Triage Notes (Signed)
 Patient arrives via [facility] for fall down 4-5 steps. Patient with lac to middle of head, right knee abrasion, and abrasion to right forearm. Patient endorses missing a step coming steps at her home. Patient arrives in c-collar placed by fire. Bleeding controlled at time of arrival. Patient having  trouble moving fingers and toes, endorses head and neck pain. CBG 188. BP 102/60, HR 104, 98 on room air.

## 2024-05-23 NOTE — ED Notes (Signed)
 Head lac cleaned out after LET applied by RN

## 2024-05-23 NOTE — ED Notes (Signed)
 Pt verbalizes understanding of pain medication risks and benefits. Pt agrees with fall precuations   Assisted pt off of bed pan and cleaned peri area.

## 2024-05-23 NOTE — H&P (Signed)
 H&P Note  Alexandria Harvey 1981/03/05  [id number].    Requesting MD: Prentice Medicus, MD Chief Complaint/Reason for Consult: fall, poly-trauma HPI:  Patient is a 43 year old female who presented initially as a non-leveled trauma activation but was upgraded to a level 2 trauma s/p fall. Patient has known left foot/ankle fracture in a CAM boot and lost her footing while ambulating. She fell forward and hit her face on the ledge of a wooden platform. Significant facial laceration noted. Patient complained of severe neck pain and numbness in upper extremities. EMS reported she was unable to move upper extremities but was moving bilateral lower extremities. She also complained of headache and abdominal pain. PMH significant for intellectual disability, schizophrenia and diabetes. She has a caregiver present along with her mother who report that patient is at baseline level of orientation.   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 She has never used smokeless tobacco. She reports that she does not drink alcohol and does not use drugs.  Allergies:  Allergies  Allergen Reactions   Haloperidol Lactate Other (See Comments)    Unknown reaction   Shrimp [Shellfish Allergy] Hives and Rash   Strawberry (Diagnostic) Rash    (Not in a hospital admission)   Blood pressure (!) 102/53, pulse (!) 101, temperature 98.7 F (37.1 C), temperature  source Oral, resp. rate 13, height 5' 2.5 (1.588 m), weight 69.9 kg, SpO2 100%. Physical Exam:  General: pleasant, WD, WN female who is laying in bed in NAD HEENT: forehead laceration without active bleeding, ENT planning bedside washout and repair, EOMI, pupils equal and round Neck: collar present  Heart: regular, rate, and rhythm. Palpable radial and pedal pulses bilaterally Lungs: No wheezes, rhonchi, or rales noted.  Respiratory effort nonlabored Abd: soft, NT, ND MS: mild edema of L foot/ankle, CAM boot currently off Skin: warm and dry with no masses, lesions, or rashes Neuro: shoulder shrug is limited by pain, numbness to BUE and weakness of BUE, unable to lift either UE without assistance. ROM grossly intact to RLE. ROM seems limited by pain to LLE which may be explained by recent fracture Psych: A&Ox4 with an blunted affect.   Results for orders placed or performed during the hospital encounter of 05/23/24 (from the past 48 hours)  CBC with Differential     Status: Abnormal   Collection Time: 05/23/24  9:29 AM  Result Value Ref Range   WBC 13.2 (H) 4.0 - 10.5 K/uL   RBC 4.29 3.87 - 5.11 MIL/uL   Hemoglobin 12.1 12.0 - 15.0 g/dL   HCT 61.4 63.9 - 53.9 %   MCV 89.7 80.0 - 100.0 fL   MCH 28.2 26.0 - 34.0 pg   MCHC 31.4 30.0 - 36.0 g/dL   RDW 82.7 (H) 88.4 - 84.4 %  Platelets 216 150 - 400 K/uL   nRBC 0.0 0.0 - 0.2 %   Neutrophils Relative % 69 %   Neutro Abs 9.0 (H) 1.7 - 7.7 K/uL   Lymphocytes Relative 22 %   Lymphs Abs 2.9 0.7 - 4.0 K/uL   Monocytes Relative 7 %   Monocytes Absolute 1.0 0.1 - 1.0 K/uL   Eosinophils Relative 1 %   Eosinophils Absolute 0.1 0.0 - 0.5 K/uL   Basophils Relative 0 %   Basophils Absolute 0.1 0.0 - 0.1 K/uL   Immature Granulocytes 1 %   Abs Immature Granulocytes 0.14 (H) 0.00 - 0.07 K/uL    Comment: Performed at [facility], [address], [place], [place] [address]  Protime-INR     Status: None   Collection Time: 05/23/24  9:29 AM   Result Value Ref Range   Prothrombin Time 14.1 11.4 - 15.2 seconds   INR 1.0 0.8 - 1.2    Comment: (NOTE) INR goal varies based on device and disease states. Performed at [facility], [address], [place], [place] [address]   Type and screen [facility]     Status: None   Collection Time: 05/23/24  9:30 AM  Result Value Ref Range   ABO/RH(D) A POS    Antibody Screen NEG    Sample Expiration      05/26/2024,2359 Performed at [facility], [address], [place], [place] [address]   I-stat chem 8, ED (not at [facility], [facility] or [facility])     Status: Abnormal   Collection Time: 05/23/24  9:55 AM  Result Value Ref Range   Sodium 138 135 - 145 mmol/L   Potassium 4.8 3.5 - 5.1 mmol/L   Chloride 106 98 - 111 mmol/L   BUN 18 6 - 20 mg/dL   Creatinine, Ser 9.39 0.44 - 1.00 mg/dL   Glucose, Bld 827 (H) 70 - 99 mg/dL    Comment: Glucose reference range applies only to samples taken after fasting for at least 8 hours.   Calcium , Ion 1.09 (L) 1.15 - 1.40 mmol/L   TCO2 18 (L) 22 - 32 mmol/L   Hemoglobin 13.9 12.0 - 15.0 g/dL   HCT 58.9 63.9 - 53.9 %  CBG monitoring, ED     Status: Abnormal   Collection Time: 05/23/24 10:00 AM  Result Value Ref Range   Glucose-Capillary 142 (H) 70 - 99 mg/dL    Comment: Glucose reference range applies only to samples taken after fasting for at least 8 hours.  Comprehensive metabolic panel with GFR     Status: Abnormal   Collection Time: 05/23/24 12:00 PM  Result Value Ref Range   Sodium 134 (L) 135 - 145 mmol/L   Potassium 5.1 3.5 - 5.1 mmol/L   Chloride 105 98 - 111 mmol/L   CO2 17 (L) 22 - 32 mmol/L   Glucose, Bld 148 (H) 70 - 99 mg/dL    Comment: Glucose reference range applies only to samples taken after fasting for at least 8 hours.   BUN 17 6 - 20 mg/dL   Creatinine, Ser 9.28 0.44 - 1.00 mg/dL   Calcium  8.3 (L) 8.9 - 10.3 mg/dL   Total Protein 5.2 (L) 6.5 - 8.1 g/dL   Albumin 2.5 (L) 3.5 - 5.0 g/dL   AST 26 15 - 41 U/L    ALT 16 0 - 44 U/L   Alkaline Phosphatase 40 38 - 126 U/L   Total  Bilirubin 0.4 0.0 - 1.2 mg/dL   GFR, Estimated >39 >39 mL/min    Comment: (NOTE) Calculated using the CKD-EPI Creatinine Equation (2021)    Anion gap 12 5 - 15    Comment: Performed at [facility], [address] [place], [place] [address]  Urinalysis, Routine w reflex microscopic -Urine, Clean Catch     Status: Abnormal   Collection Time: 05/23/24 12:09 PM  Result Value Ref Range   Color, Urine YELLOW YELLOW   APPearance CLEAR CLEAR   Specific Gravity, Urine >1.046 (H) 1.005 - 1.030   pH 5.0 5.0 - 8.0   Glucose, UA NEGATIVE NEGATIVE mg/dL   Hgb urine dipstick NEGATIVE NEGATIVE   Bilirubin Urine NEGATIVE NEGATIVE   Ketones, ur 5 (A) NEGATIVE mg/dL   Protein, ur NEGATIVE NEGATIVE mg/dL   Nitrite NEGATIVE NEGATIVE   Leukocytes,Ua NEGATIVE NEGATIVE    Comment: Performed at [facility], [address], [place], [place] [address]   MR CERVICAL SPINE WO CONTRAST Result Date: 05/23/2024 CLINICAL DATA:  Fall down stairs. Multiple fractures of the posterior elements of the cervical spine and T4 and T5 vertebral bodies on CT. EXAM: MRI CERVICAL AND THORACIC SPINE WITHOUT CONTRAST TECHNIQUE: Multiplanar and multiecho pulse sequences of the cervical spine, to include the craniocervical junction and cervicothoracic junction, and the thoracic spine, were obtained without intravenous contrast. COMPARISON:  Same day CT of the cervical and thoracic spine. MRI of the cervical spine 04/09/2020. FINDINGS: MRI CERVICAL SPINE FINDINGS Alignment: Normal. Vertebrae: As seen on CT, there are multiple acute fractures involving the posterior elements at C3, C5 and C7 with associated marrow and surrounding soft tissue edema. No evidence of acute cervical vertebral fracture or traumatic subluxation. There are mild chronic healed superior endplate compression deformities at T1 and T2 which are unchanged from previous MRI. Cord: Subtle  cord T2 hyperintensity at C3-4, potentially reflecting mild cord contusion. There is no cord compression or hemorrhage. The cervical cord is otherwise normal in signal and caliber. No evidence of epidural hematoma. Posterior Fossa, vertebral arteries, paraspinal tissues: Visualized portions of the posterior fossa appear unremarkable.Bilateral vertebral artery flow voids. Small amount of prevertebral soft tissue edema. As above, there is irregular edema/hemorrhage surrounding the spinous process fractures, especially at C5. In addition, there is a probable subcutaneous hematoma superficial to the ligamentum nuchae and extending from C6-T2, measuring 7.7 cm in length on sagittal image 9/3. Disc levels: C2-3: As correlated with CT, there is interbody and interfacetal ankylosis. No significant spinal stenosis or foraminal narrowing. C3-4: Mild disc bulging. Mild spinal stenosis without cord deformity. No significant foraminal narrowing. C4-5: Small central disc protrusion. No spinal stenosis or significant foraminal narrowing. C5-6: Small central disc protrusion. No spinal stenosis or significant foraminal narrowing. C6-7: Small central disc protrusion. No spinal stenosis or significant foraminal narrowing. C7-T1: Mild facet hypertrophy. No spinal stenosis or significant foraminal narrowing. MRI THORACIC SPINE FINDINGS Technical note: Despite efforts by the technologist and patient, mild motion artifact is present on today's exam and could not be eliminated. This reduces exam sensitivity and specificity. Alignment:  Physiologic. Vertebrae: As demonstrated on CT, there are acute minimally displaced fractures of the T4 and T5 vertebral bodies. The T4 fracture extends into the right facet joint and spinous process on CT, consistent with a Chance fracture. There is mild osseous retropulsion on the right by approximately 3 mm, best seen on the axial images. There is associated marrow and surrounding soft tissue edema. In  addition,  there is a nondisplaced fracture involving the posterosuperior corner of the T6 vertebral body, best seen on the sagittal images. No acute osseous findings within the lower thoracic spine. Cord: The thoracic cord appears normal in signal and caliber.The conus medullaris extends to the T12-L1 disc space level. Paraspinal and other soft tissues: Mild paraspinous edema/hemorrhage posteriorly adjacent to the T4 spinous process fracture. As above, subcutaneous hematoma superficial to the ligamentum nuchae extending inferiorly to the T2 level. Disc levels: No significant disc space findings in the upper thoracic spine. T4-5: Mild osseous retropulsion on the right with mild mass effect on the thoracic cord, best seen on the axial images. No significant foraminal narrowing, epidural hematoma or disc herniation. T5-6: No significant findings. T6-7: Loss of disc height with a small partially calcified central disc protrusion. Mild mass effect on the ventral aspect of the cord. There is ample CSF posteriorly. T7-8: Mild disc bulging with anterior osteophytes. No spinal stenosis or significant foraminal narrowing. T8-9: Mild disc bulging with a small calcified superiorly extruded disc fragment. No cord deformity or significant foraminal narrowing. T9-10: No spinal stenosis or significant foraminal narrowing. T10-11: Mild facet hypertrophy. No spinal stenosis or significant foraminal narrowing. T11-12: Mild facet hypertrophy. No spinal stenosis or significant foraminal narrowing. IMPRESSION: 1. Multiple acute fractures involving the posterior elements at C3, C5 and C7 as correlated with earlier CT. Associated paraspinal edema which could reflect associated ligamentous injury. No evidence of acute cervical vertebral fracture or traumatic subluxation. 2. Subtle cord T2 hyperintensity at C3-4, potentially reflecting mild cord contusion. No cervical cord compression, cord hemorrhage or epidural hematoma. 3. Acute fractures  of T4, T5 and T6 vertebral bodies. T4 fracture extends into the posterior elements, consistent with a Chance fracture. Mild osseous retropulsion on the right at T4 with mild mass effect on the thoracic cord. No significant foraminal narrowing or epidural hematoma. 4. Subcutaneous hematoma superficial to the ligamentum nuchae extending from C6 to the T2 level. 5. Mild thoracic spondylosis as described. Electronically Signed   By: Elsie Perone M.D.   On: 05/23/2024 13:30   MR THORACIC SPINE WO CONTRAST Result Date: 05/23/2024 CLINICAL DATA:  Fall down stairs. Multiple fractures of the posterior elements of the cervical spine and T4 and T5 vertebral bodies on CT. EXAM: MRI CERVICAL AND THORACIC SPINE WITHOUT CONTRAST TECHNIQUE: Multiplanar and multiecho pulse sequences of the cervical spine, to include the craniocervical junction and cervicothoracic junction, and the thoracic spine, were obtained without intravenous contrast. COMPARISON:  Same day CT of the cervical and thoracic spine. MRI of the cervical spine 04/09/2020. FINDINGS: MRI CERVICAL SPINE FINDINGS Alignment: Normal. Vertebrae: As seen on CT, there are multiple acute fractures involving the posterior elements at C3, C5 and C7 with associated marrow and surrounding soft tissue edema. No evidence of acute cervical vertebral fracture or traumatic subluxation. There are mild chronic healed superior endplate compression deformities at T1 and T2 which are unchanged from previous MRI. Cord: Subtle cord T2 hyperintensity at C3-4, potentially reflecting mild cord contusion. There is no cord compression or hemorrhage. The cervical cord is otherwise normal in signal and caliber. No evidence of epidural hematoma. Posterior Fossa, vertebral arteries, paraspinal tissues: Visualized portions of the posterior fossa appear unremarkable.Bilateral vertebral artery flow voids. Small amount of prevertebral soft tissue edema. As above, there is irregular edema/hemorrhage  surrounding the spinous process fractures, especially at C5. In addition, there is a probable subcutaneous hematoma superficial to the ligamentum nuchae and extending from C6-T2, measuring 7.7 cm in  length on sagittal image 9/3. Disc levels: C2-3: As correlated with CT, there is interbody and interfacetal ankylosis. No significant spinal stenosis or foraminal narrowing. C3-4: Mild disc bulging. Mild spinal stenosis without cord deformity. No significant foraminal narrowing. C4-5: Small central disc protrusion. No spinal stenosis or significant foraminal narrowing. C5-6: Small central disc protrusion. No spinal stenosis or significant foraminal narrowing. C6-7: Small central disc protrusion. No spinal stenosis or significant foraminal narrowing. C7-T1: Mild facet hypertrophy. No spinal stenosis or significant foraminal narrowing. MRI THORACIC SPINE FINDINGS Technical note: Despite efforts by the technologist and patient, mild motion artifact is present on today's exam and could not be eliminated. This reduces exam sensitivity and specificity. Alignment:  Physiologic. Vertebrae: As demonstrated on CT, there are acute minimally displaced fractures of the T4 and T5 vertebral bodies. The T4 fracture extends into the right facet joint and spinous process on CT, consistent with a Chance fracture. There is mild osseous retropulsion on the right by approximately 3 mm, best seen on the axial images. There is associated marrow and surrounding soft tissue edema. In addition, there is a nondisplaced fracture involving the posterosuperior corner of the T6 vertebral body, best seen on the sagittal images. No acute osseous findings within the lower thoracic spine. Cord: The thoracic cord appears normal in signal and caliber.The conus medullaris extends to the T12-L1 disc space level. Paraspinal and other soft tissues: Mild paraspinous edema/hemorrhage posteriorly adjacent to the T4 spinous process fracture. As above, subcutaneous  hematoma superficial to the ligamentum nuchae extending inferiorly to the T2 level. Disc levels: No significant disc space findings in the upper thoracic spine. T4-5: Mild osseous retropulsion on the right with mild mass effect on the thoracic cord, best seen on the axial images. No significant foraminal narrowing, epidural hematoma or disc herniation. T5-6: No significant findings. T6-7: Loss of disc height with a small partially calcified central disc protrusion. Mild mass effect on the ventral aspect of the cord. There is ample CSF posteriorly. T7-8: Mild disc bulging with anterior osteophytes. No spinal stenosis or significant foraminal narrowing. T8-9: Mild disc bulging with a small calcified superiorly extruded disc fragment. No cord deformity or significant foraminal narrowing. T9-10: No spinal stenosis or significant foraminal narrowing. T10-11: Mild facet hypertrophy. No spinal stenosis or significant foraminal narrowing. T11-12: Mild facet hypertrophy. No spinal stenosis or significant foraminal narrowing. IMPRESSION: 1. Multiple acute fractures involving the posterior elements at C3, C5 and C7 as correlated with earlier CT. Associated paraspinal edema which could reflect associated ligamentous injury. No evidence of acute cervical vertebral fracture or traumatic subluxation. 2. Subtle cord T2 hyperintensity at C3-4, potentially reflecting mild cord contusion. No cervical cord compression, cord hemorrhage or epidural hematoma. 3. Acute fractures of T4, T5 and T6 vertebral bodies. T4 fracture extends into the posterior elements, consistent with a Chance fracture. Mild osseous retropulsion on the right at T4 with mild mass effect on the thoracic cord. No significant foraminal narrowing or epidural hematoma. 4. Subcutaneous hematoma superficial to the ligamentum nuchae extending from C6 to the T2 level. 5. Mild thoracic spondylosis as described. Electronically Signed   By: Elsie Perone M.D.   On:  05/23/2024 13:30   CT T-SPINE NO CHARGE Result Date: 05/23/2024 CLINICAL DATA:  Fall down stairs. Neck and back pain. Cervical spine fractures. EXAM: CT Thoracic Spine without contrast TECHNIQUE: Multiplanar CT images of the thoracic spine were reconstructed from contemporary CT of the Chest. RADIATION DOSE REDUCTION: This exam was performed according to the departmental dose-optimization  program which includes automated exposure control, adjustment of the mA and/or kV according to patient size and/or use of iterative reconstruction technique. CONTRAST:  No additional COMPARISON:  Same day cervical spine CT. Thoracic spine radiographs 02/07/2019. Chest CT 167183. FINDINGS: Alignment: Normal. Vertebrae: In addition to the separately reported cervical spine injuries, there are acute minimally displaced fractures of the T4 and T5 vertebral bodies. Minimal osseous retropulsion at T4. Fracture extends into the inferior right T4 articulating facet and spinous process. No significant loss of vertebral body height. No other fractures are identified. Paraspinal and other soft tissues: Mild anterior paraspinal soft tissue edema at T4 and T5. No focal hematoma. Additional chest details dictated separately. Disc levels: Mild multilevel thoracic spondylosis with disc bulging, small disc protrusions and endplate osteophytes. IMPRESSION: 1. Acute minimally displaced fractures of the T4 and T5 vertebral bodies with extension into the inferior right T4 articulating facet and spinous process. Minimal osseous retropulsion at T4. The T4 fracture is best categorized as a Chance fracture, potentially unstable. 2. No other acute osseous findings. 3. Mild thoracic spondylosis. 4. Additional chest details dictated separately. See separate cervical spine CT report. 5. These results were called by telephone at the time of interpretation on 05/23/2024 at 11:14 am to provider Prentice Medicus, who verbally acknowledged these results. Electronically  Signed   By: Elsie Perone M.D.   On: 05/23/2024 11:14   CT CHEST ABDOMEN PELVIS W CONTRAST Result Date: 05/23/2024 EXAM: CT CHEST, ABDOMEN AND PELVIS WITH CONTRAST 05/23/2024 10:13:43 AM TECHNIQUE: CT of the chest, abdomen and pelvis was performed with the administration of intravenous contrast. Multiplanar reformatted images are provided for review. Automated exposure control, iterative reconstruction, and/or weight based adjustment of the mA/kV was utilized to reduce the radiation dose to as low as reasonably achievable. COMPARISON: CT of the chest dated 12/03/2014. CLINICAL HISTORY: Trauma. Triage notes; Patient arrives via [facility] for fall down 4-5 steps. Patient with lac to middle of head, right knee abrasion, and abrasion to right forearm. Patient endorses missing a step coming steps at her home. Patient arrives in c-collar placed by fire. Bleeding controlled at time of arrival. FINDINGS: CHEST: MEDIASTINUM AND LYMPH NODES: Heart and pericardium are unremarkable. The central airways are clear. No mediastinal, hilar or axillary lymphadenopathy. LUNGS AND PLEURA: No focal consolidation or pulmonary edema. No pleural effusion or pneumothorax. ABDOMEN AND PELVIS: LIVER: The liver is unremarkable. GALLBLADDER AND BILE DUCTS: Gallbladder is unremarkable. No biliary ductal dilatation. SPLEEN: No acute abnormality. PANCREAS: No acute abnormality. ADRENAL GLANDS: No acute abnormality. KIDNEYS, URETERS AND BLADDER: No stones in the kidneys or ureters. No hydronephrosis. No perinephric or periureteral stranding. Urinary bladder is unremarkable. GI AND BOWEL: Stomach demonstrates no acute abnormality. There is no bowel obstruction. REPRODUCTIVE ORGANS: No acute abnormality. PERITONEUM AND RETROPERITONEUM: No ascites. No free air. VASCULATURE: Aorta is normal in caliber. ABDOMINAL AND PELVIS LYMPH NODES: No lymphadenopathy. REPRODUCTIVE ORGANS: No acute abnormality. BONES AND SOFT TISSUES: Chronic bilateral  spondylolysis and slight anterolisthesis present at L5-S1. Healed fracture deformities of the left posterior rib cage. No acute fractures evident. IMPRESSION: 1. No acute abnormality of the chest, abdomen, and pelvis related to the provided clinical history of trauma. Electronically signed by: Evalene Coho MD 05/23/2024 10:36 AM EDT RP Workstation: [id number]   DG Chest Portable 1 View Result Date: 05/23/2024 EXAM: 1 VIEW XRAY OF THE CHEST 05/23/2024 10:15:00 AM COMPARISON: Radiograph of the chest dated 09/10/2019. CLINICAL HISTORY: Trauma. Level 2 trauma- Fall. FINDINGS: LUNGS AND PLEURA: Low lung  volumes. No focal pulmonary opacity. No pulmonary edema. No pleural effusion. No pneumothorax. HEART AND MEDIASTINUM: No acute abnormality of the cardiac and mediastinal silhouettes. BONES AND SOFT TISSUES: Degenerative changes of right shoulder. No acute osseous abnormality. IMPRESSION: 1. No acute process. 2. Low lung volumes. Electronically signed by: Evalene Coho MD 05/23/2024 10:31 AM EDT RP Workstation: [id number]   CT Cervical Spine Wo Contrast Result Date: 05/23/2024 CLINICAL DATA:  Fall down multiple steps. Head laceration and neck pain. EXAM: CT CERVICAL SPINE WITHOUT CONTRAST TECHNIQUE: Multidetector CT imaging of the cervical spine was performed without intravenous contrast. Multiplanar CT image reconstructions were also generated. RADIATION DOSE REDUCTION: This exam was performed according to the departmental dose-optimization program which includes automated exposure control, adjustment of the mA and/or kV according to patient size and/or use of iterative reconstruction technique. COMPARISON:  CT cervical spine 09/08/2019. MRI cervical spine 04/09/2020. FINDINGS: Alignment: Normal. Skull base and vertebrae: There are multiple acute cervical spine fractures. There are nondisplaced fractures involving the base of the C3 spinous process and left lamina. There is partial interbody and interfacetal  ankylosis at C2-3. There is a comminuted and mildly displaced fracture of the C5 spinous process and right lamina. There is a mildly displaced fracture of the C7 spinous process. No definite involvement of the facet joints or traumatic subluxation. No evidence of vertebral body fracture. Soft tissues and spinal canal: No prevertebral fluid or swelling. No visible canal hematoma. There is posterior paraspinal soft tissue swelling adjacent to the fractures of the posterior elements. Disc levels: The disc heights are relatively maintained. There are paraspinal osteophytes which are most prominent anteriorly at C4-5 and C5-6. As above, partial interbody and interfacetal ankylosis at C2-3. No evidence of large disc herniation or high-grade foraminal narrowing. Upper chest: Chest findings dictated separately. Other: Carotid atherosclerosis. IMPRESSION: 1. Multiple acute cervical spine fractures involving the C3, C5 and C7 posterior elements as described. No evidence of traumatic subluxation. 2. No evidence of vertebral body fracture. 3. No evidence of large disc herniation or high-grade foraminal narrowing. 4. Critical Value/emergent results were called by telephone at the time of interpretation on 05/23/2024 at 10:30 am to provider ANDREW TEE , who verbally acknowledged these results. Electronically Signed   By: Elsie Perone M.D.   On: 05/23/2024 10:31   CT Head Wo Contrast Result Date: 05/23/2024 EXAM: CT HEAD WITHOUT CONTRAST 05/23/2024 10:13:43 AM TECHNIQUE: CT of the head was performed without the administration of intravenous contrast. Automated exposure control, iterative reconstruction, and/or weight based adjustment of the mA/kV was utilized to reduce the radiation dose to as low as reasonably achievable. COMPARISON: 09/08/2019 CLINICAL HISTORY: Trauma. Patient arrives via [facility] for fall down 4-5 steps. Patient with lac to middle of head, right knee abrasion, and abrasion to right forearm. Patient endorses  missing a step coming steps at her home. Patient arrives in c-collar placed by fire. Bleeding controlled at time of arrival. Patient having trouble moving fingers and toes, endorses head and neck pain. CBG 188. BP 102/60, HR 104, 98 on room air. FINDINGS: BRAIN AND VENTRICLES: No acute hemorrhage. No evidence of acute infarct. No hydrocephalus. No extra-axial collection. No mass effect or midline shift. Moderate generalized cerebral volume loss present, which is advanced for the patient's age. Mild periventricular white matter disease. ORBITS: No acute abnormality. SINUSES: No acute abnormality. SOFT TISSUES AND SKULL: Right forehead laceration and soft tissue swelling. No skull fracture. IMPRESSION: 1. No acute intracranial abnormality. 2. Moderate generalized cerebral volume loss, advanced  for the patient's age. 3. Mild periventricular white matter disease. 4. Right forehead laceration and soft tissue swelling. Electronically signed by: Evalene Coho MD 05/23/2024 10:30 AM EDT RP Workstation: [id number]      Assessment/Plan Fall T4/T5 fractures C3C5/C7 posterior element fractures with central cord syndrome - per NS, Dr. Lanis consulted - CTO brace for now and no surgery planned acutely  - mobilize with PT/OT, multimodal pain control Forehead laceration - ENT consulted, closure per Dr. Luciano Recent left foot/ankle fracture - CAM boot  T2DM - SSI Schizophrenia and intellectual delay - reordered home meds  Admit to progressive for pain control and therapies. Ok for CLD today and will advance tomorrow if tolerating. Reordered home meds except for diabetic meds, SSI ordered instead.   FEN: CLD, SLIV VTE: SCD to RLE, will start LMWH when cleared by NS ID: no current abx, caregiver reported Tdap was UTD   I reviewed ED provider notes, Consultant ENT and NS notes, last 24 h vitals and pain scores, last 48 h intake and output, last 24 h labs and trends, and last 24 h imaging results.  This  care required high  level of medical decision making.   Burnard JONELLE Louder, [facility] Surgery 05/23/2024, 1:37 PM Please see Amion for pager number during day hours 7:00am-4:30pm

## 2024-05-24 LAB — BASIC METABOLIC PANEL WITH GFR
Anion gap: 11 (ref 5–15)
BUN: 23 mg/dL — ABNORMAL HIGH (ref 6–20)
CO2: 19 mmol/L — ABNORMAL LOW (ref 22–32)
Calcium: 7.9 mg/dL — ABNORMAL LOW (ref 8.9–10.3)
Chloride: 102 mmol/L (ref 98–111)
Creatinine, Ser: 0.69 mg/dL (ref 0.44–1.00)
GFR, Estimated: >60 mL/min (ref >60)
Glucose, Bld: 139 mg/dL — ABNORMAL HIGH (ref 70–99)
Potassium: 4.2 mmol/L (ref 3.5–5.1)
Sodium: 132 mmol/L — ABNORMAL LOW (ref 135–145)

## 2024-05-24 LAB — CBC
HCT: 31.7 % — ABNORMAL LOW (ref 36.0–46.0)
HCT: 31 % — ABNORMAL LOW (ref 36.0–46.0)
Hemoglobin: 10.2 g/dL — ABNORMAL LOW (ref 12.0–15.0)
Hemoglobin: 10.1 g/dL — ABNORMAL LOW (ref 12.0–15.0)
MCH: 28.1 pg (ref 26.0–34.0)
MCH: 28.5 pg (ref 26.0–34.0)
MCHC: 32.2 g/dL (ref 30.0–36.0)
MCHC: 32.6 g/dL (ref 30.0–36.0)
MCV: 87.3 fL (ref 80.0–100.0)
MCV: 87.3 fL (ref 80.0–100.0)
Platelets: 185 K/uL (ref 150–400)
Platelets: 168 K/uL (ref 150–400)
RBC: 3.63 MIL/uL — ABNORMAL LOW (ref 3.87–5.11)
RBC: 3.55 MIL/uL — ABNORMAL LOW (ref 3.87–5.11)
RDW: 16.8 % — ABNORMAL HIGH (ref 11.5–15.5)
RDW: 16.7 % — ABNORMAL HIGH (ref 11.5–15.5)
WBC: 9.9 K/uL (ref 4.0–10.5)
WBC: 11.8 K/uL — ABNORMAL HIGH (ref 4.0–10.5)
nRBC: 0 % (ref 0.0–0.2)
nRBC: 0 % (ref 0.0–0.2)

## 2024-05-24 LAB — GLUCOSE, CAPILLARY
Glucose-Capillary: 167 mg/dL — ABNORMAL HIGH (ref 70–99)
Glucose-Capillary: 264 mg/dL — ABNORMAL HIGH (ref 70–99)
Glucose-Capillary: 179 mg/dL — ABNORMAL HIGH (ref 70–99)
Glucose-Capillary: 126 mg/dL — ABNORMAL HIGH (ref 70–99)

## 2024-05-24 LAB — HIV ANTIBODY (ROUTINE TESTING W REFLEX): HIV Screen 4th Generation wRfx: NONREACTIVE

## 2024-05-24 MED ORDER — SODIUM CHLORIDE 0.9 % IV BOLUS
500.0000 mL | Freq: Once | INTRAVENOUS | Status: AC
  Administered 2024-05-24: 500 mL via INTRAVENOUS

## 2024-05-24 MED ORDER — FENTANYL CITRATE PF 50 MCG/ML IJ SOSY
50.0000 ug | PREFILLED_SYRINGE | Freq: Once | INTRAMUSCULAR | Status: AC
  Administered 2024-05-24: 50 ug via INTRAVENOUS
  Filled 2024-05-24: qty 1

## 2024-05-24 MED ORDER — LISINOPRIL 5 MG PO TABS
2.5000 mg | ORAL_TABLET | Freq: Every day | ORAL | Status: DC
Start: 2024-05-26 — End: 2024-05-25

## 2024-05-24 MED ORDER — ATENOLOL 50 MG PO TABS
50.0000 mg | ORAL_TABLET | Freq: Every day | ORAL | Status: DC
Start: 2024-05-26 — End: 2024-05-25

## 2024-05-24 MED ORDER — NALOXONE HCL 0.4 MG/ML IJ SOLN
INTRAMUSCULAR | Status: AC
  Administered 2024-05-24: 0.4 mg via INTRAVENOUS
  Filled 2024-05-24: qty 1

## 2024-05-24 MED ORDER — LACTATED RINGERS IV BOLUS
1000.0000 mL | Freq: Once | INTRAVENOUS | Status: AC
Start: 2024-05-24 — End: 2024-05-24
  Administered 2024-05-24: 1000 mL via INTRAVENOUS

## 2024-05-24 MED ORDER — NALOXONE HCL 0.4 MG/ML IJ SOLN: 0.4000 mg | INTRAMUSCULAR | Status: DC | PRN

## 2024-05-24 MED ORDER — OXYCODONE HCL 5 MG PO TABS
2.5000 mg | ORAL_TABLET | ORAL | Status: DC | PRN
  Administered 2024-05-26: 2.5 mg via ORAL
  Filled 2024-05-24: qty 1

## 2024-05-24 MED ORDER — SODIUM CHLORIDE 0.9 % IV SOLN: INTRAVENOUS | Status: DC

## 2024-05-24 NOTE — Significant Event (Signed)
 Trauma Event Note    Reason for Call :  Called by primary RN regarding change in mentation. Upon rounding earlier in the day, pt alert x4 and appropriate. Now minimally responsive to painful stimuli.  Initial Focused Assessment:  Pt supine in bed, lab tech at bedside, not responsive to verbal, sternal rub. Hypoxic on 4L  89% with good pleth, hypotensive 91/54 HR 100 RR 17. Painful stimuli ineffective at awakening pt. Pt received 50mcg fentanyl  approximately 30 minutes prior to primary RN discovering pt was unresponsive.  Interventions:  TRN notified attending MD Sebastian via text, utilized TRN protocol for narcan  administration 0.4MG  given IV.   Plan of Care:  Administer bolus. Use caution with administration of IV narcotics in future - no active orders for fentanyl  exist however patient does have active order for IV push dilaudid  PRN.  Event Summary:  TRN called by primary nurse regarding decline in mental status. Pt had been complaining of pain earlier in the evening and had received fentanyl  50 MCG around 0400. Pt had received same dose of fentanyl  as well as a dose of dilaudid  and tolerated well. This Clinical research associate met patient on rounds earlier in shift and she was alertx4, appropriate and very talkative. Much different clinical picture as pt now unresponsive, hypotensive in the 90s with O2 sats in the high 80s. Increased pt's O2. Attempted to wake patient with sternal rub and painful stimuli with no response. Utilized TRN protocols, 0.4mg  narcan  given, MD notified of change in presentation. Pt more alert to sternal rub within a few minutes, alert x4 within 15 minutes. Discussed hypotension with MD as well, received verbal order for saline bolus. AM labs drawn.  MD Notified: Thompson via test [phone], response [id number] Call Time: 0426 Arrival Time: 0429 End Time: 0515  Last imported Vital Signs BP 95/63   Pulse 100   Temp 98.3 F (36.8 C) (Axillary)   Resp 13   Ht 5' 2.5  (1.588 m)   Wt 154 lb 1.6 oz (69.9 kg)   SpO2 97%   BMI 27.74 kg/m   Trending CBC Recent Labs    05/23/24 0929 05/23/24 0955  WBC 13.2*  --   HGB 12.1 13.9  HCT 38.5 41.0  PLT 216  --     Trending Coag's Recent Labs    05/23/24 0929  INR 1.0    Trending BMET Recent Labs    05/23/24 0955 05/23/24 1200  NA 138 134*  K 4.8 5.1  CL 106 105  CO2  --  17*  BUN 18 17  CREATININE 0.60 0.71  GLUCOSE 172* 148*      Alexandria Harvey Alexandria Harvey  Trauma Response RN  Please call TRN at [phone] for further assistance.

## 2024-05-24 NOTE — Evaluation (Signed)
 Occupational Therapy Evaluation Patient Details Name: Alexandria Harvey MRN: [id number] DOB: [birth year] Today's Date: 05/24/2024   History of Present Illness   43 year old female presented 05/23/24 S/P fall with forehead laceration and C3, C5, C7 FXs with central cord type SCI. T4-5 fxs Patient has known left foot/ankle fracture in a CAM boot and lost her footing while ambulating. PMH significant for intellectual disability, schizophrenia, dropped head syndrome, HTN, seizures, and diabetes.     Clinical Impressions Prior to this admission, patient living in what is assummed to be a group home, but patient was unable to state fully. Caregivers not present at time of evaluation. Currently, patient presenting with numbness in BUEs and trace activation in R forearm and L bicep, extensor tone noted in R leg, and minimal activation noted in LLE at toes. OT and PT maintaining cervical precautions and total A of 2 to roll to EOB. Patient screaming in pain (unable to specify where) and HR to 123. Patient unable to maintain sitting position and returned to supine with BUEs and BLEs positioned with pillows. Patient also would greatly benefit from an air mattress due to minimal activation in all extremities and to prevent skin breakdown. Given prior level, OT recommending intensive rehab > 3 hours prior to discharge. OT will continue to follow.     If plan is discharge home, recommend the following:   Two people to help with walking and/or transfers;Two people to help with bathing/dressing/bathroom;Assistance with cooking/housework;Assistance with feeding;Direct supervision/assist for medications management;Assist for transportation;Help with stairs or ramp for entrance;Supervision due to cognitive status;Direct supervision/assist for financial management     Functional Status Assessment   Patient has had a recent decline in their functional status and demonstrates the ability to make significant  improvements in function in a reasonable and predictable amount of time.     Equipment Recommendations   Other (comment) (defer to next venue)     Recommendations for Other Services   Rehab consult     Precautions/Restrictions   Precautions Precautions: Cervical Precaution Booklet Issued: No Recall of Precautions/Restrictions: Impaired Required Braces or Orthoses: Cervical Brace;Other Brace Cervical Brace: Hard collar;At all times Other Brace: CAM boot L leg Restrictions Weight Bearing Restrictions Per Provider Order: No     Mobility Bed Mobility Overal bed mobility: Needs Assistance Bed Mobility: Rolling, Sidelying to Sit, Sit to Sidelying Rolling: Total assist, +2 for physical assistance, +2 for safety/equipment Sidelying to sit: Total assist, +2 for physical assistance, +2 for safety/equipment     Sit to sidelying: Total assist, +2 for physical assistance, +2 for safety/equipment General bed mobility comments: Total A of 2 for all mobility, no current ability to assist    Transfers                   General transfer comment: deferred due to current level      Balance Overall balance assessment: Needs assistance Sitting-balance support: Bilateral upper extremity supported, Feet supported Sitting balance-Leahy Scale: Poor Sitting balance - Comments: reliant on external assist                                   ADL either performed or assessed with clinical judgement   ADL Overall ADL's : Needs assistance/impaired Eating/Feeding: Total assistance;Bed level   Grooming: Total assistance;Bed level   Upper Body Bathing: Total assistance;Bed level   Lower Body Bathing: Total assistance;Sit to/from stand;Sitting/lateral leans  Upper Body Dressing : Total assistance;Bed level   Lower Body Dressing: Total assistance;Bed level   Toilet Transfer: Total assistance;+2 for physical assistance;+2 for safety/equipment Toilet Transfer  Details (indicate cue type and reason): rolling Toileting- Clothing Manipulation and Hygiene: Total assistance;+2 for safety/equipment;+2 for physical assistance;Bed level       Functional mobility during ADLs: Total assistance;+2 for physical assistance;+2 for safety/equipment;Cueing for safety;Cueing for sequencing General ADL Comments: Prior to this admission, patient living in what is assummed to be a group home, but patient was unable to state fully. Caregivers not present at time of evaluation. Currently, patient presenting with numbness in BUEs and trace activation in R forearm and L bicep, extensor tone noted in R leg, and minimal activation noted in LLE at toes. OT and PT maintaining cervical precautions and total A of 2 to roll to EOB. Patient screaming in pain (unable to specify where) and HR to 123. Patient unable to maintain sitting position and returned to supine with BUEs and BLEs positioned with pillows. Patient also would greatly benefit from an air mattress due to minimal activation in all extremities and to prevent skin breakdown. Given prior level, OT recommending intensive rehab > 3 hours prior to discharge. OT will continue to follow.     Vision Baseline Vision/History: 0 No visual deficits Ability to See in Adequate Light: 0 Adequate Vision Assessment?: Vision impaired- to be further tested in functional context Additional Comments: potential 2 beat nystagmus when looking RIGHT, however decreased command following and unable to visually track this session     Perception Perception: Not tested       Praxis Praxis: Not tested       Pertinent Vitals/Pain Pain Assessment Pain Assessment: Faces Faces Pain Scale: Hurts worst Pain Location: all over Pain Descriptors / Indicators: Grimacing, Guarding, Discomfort Pain Intervention(s): Limited activity within patient's tolerance, Monitored during session, Repositioned     Extremity/Trunk Assessment Upper Extremity  Assessment Upper Extremity Assessment: Generalized weakness;RUE deficits/detail;LUE deficits/detail RUE Deficits / Details: reports numbness throughout, could not squeeze hand, trace activation noted in the forearm RUE Sensation: decreased light touch RUE Coordination: decreased fine motor;decreased gross motor LUE Deficits / Details: reports numbness throughout, minimal activation noted at bicep LUE Sensation: decreased light touch LUE Coordination: decreased fine motor;decreased gross motor   Lower Extremity Assessment Lower Extremity Assessment: Defer to PT evaluation   Cervical / Trunk Assessment Cervical / Trunk Assessment: Other exceptions (neck fx's)   Communication Communication Communication: Impaired Factors Affecting Communication: Reduced clarity of speech   Cognition Arousal: Alert Behavior During Therapy: Flat affect Cognition: History of cognitive impairments, No family/caregiver present to determine baseline, Cognition impaired   Orientation impairments: Place, Time Awareness: Intellectual awareness intact, Online awareness impaired Memory impairment (select all impairments): Short-term memory Attention impairment (select first level of impairment): Focused attention Executive functioning impairment (select all impairments): Initiation, Organization, Sequencing, Reasoning, Problem solving OT - Cognition Comments: Patient with intellectual delay at baseline, with no caregivers present to determine baseline, STM deficits noted, and delayed answers to all lines of questioning                 Following commands: Impaired Following commands impaired: Follows one step commands inconsistently, Follows one step commands with increased time     Cueing  General Comments   Cueing Techniques: Verbal cues;Gestural cues;Tactile cues;Visual cues  HR up to 123 sitting EOB, though patient screaming   Exercises     Shoulder Instructions      Home  Living  Family/patient expects to be discharged to:: Group home Living Arrangements: Group Home Available Help at Discharge: Available 24 hours/day                             Additional Comments: Unable to provide any further information      Prior Functioning/Environment Prior Level of Function : History of Falls (last six months)             Mobility Comments: could not provide, assumed to be walking without assist ADLs Comments: could not provide, appears to be able to complete basic ADLs    OT Problem List: Decreased strength;Decreased range of motion;Decreased activity tolerance;Impaired balance (sitting and/or standing);Impaired vision/perception;Decreased coordination;Decreased cognition;Decreased knowledge of use of DME or AE;Decreased safety awareness;Decreased knowledge of precautions;Impaired tone;Impaired sensation;Impaired UE functional use;Pain;Increased edema   OT Treatment/Interventions: Self-care/ADL training;Therapeutic exercise;Energy conservation;Neuromuscular education;DME and/or AE instruction;Manual therapy;Modalities;Therapeutic activities;Cognitive remediation/compensation;Visual/perceptual remediation/compensation;Patient/family education;Balance training      OT Goals(Current goals can be found in the care plan section)   Acute Rehab OT Goals Patient Stated Goal: unable OT Goal Formulation: Patient unable to participate in goal setting Time For Goal Achievement: 06/07/24 Potential to Achieve Goals: Good   OT Frequency:  Min 2X/week    Co-evaluation   Reason for Co-Treatment: Complexity of the patient's impairments (multi-system involvement);Necessary to address cognition/behavior during functional activity;For patient/therapist safety;To address functional/ADL transfers PT goals addressed during session: Mobility/safety with mobility;Balance;Strengthening/ROM OT goals addressed during session: ADL's and self-care;Proper use of Adaptive  equipment and DME;Strengthening/ROM      AM-PAC OT 6 Clicks Daily Activity     Outcome Measure Help from another person eating meals?: Total Help from another person taking care of personal grooming?: Total Help from another person toileting, which includes using toliet, bedpan, or urinal?: Total Help from another person bathing (including washing, rinsing, drying)?: Total Help from another person to put on and taking off regular upper body clothing?: Total Help from another person to put on and taking off regular lower body clothing?: Total 6 Click Score: 6   End of Session Equipment Utilized During Treatment: Cervical collar;Other (comment) (L CAM boot) Nurse Communication: Mobility status  Activity Tolerance: Patient limited by pain Patient left: in bed;with call bell/phone within reach;with bed alarm set  OT Visit Diagnosis: Unsteadiness on feet (R26.81);Other abnormalities of gait and mobility (R26.89);Muscle weakness (generalized) (M62.81);History of falling (Z91.81);Other symptoms and signs involving cognitive function;Feeding difficulties (R63.3);Pain                Time: 9069-9043 OT Time Calculation (min): 26 min Charges:  OT General Charges $OT Visit: 1 Visit OT Evaluation $OT Eval Moderate Complexity: 1 Mod  Ronal Gift E. Kynlea Blackston, OTR/L Acute Rehabilitation Services [phone]   Ronal Gift Salt 05/24/2024, 10:20 AM

## 2024-05-24 NOTE — Progress Notes (Signed)
 Inpatient Rehab Admissions Coordinator:   Per therapy recommendations, patient was screened for CIR candidacy by Leita Kleine, MS, CCC-SLP. At this time, Pt. is not yet tolerating getting up OOB and I do not think she could tolerate the intensity of CIR.   Pt. may have potential to progress to becoming a potential CIR candidate, so CIR admissions team will follow and monitor for progress and participation with therapies and place consult order if Pt. appears to be an appropriate candidate. If Pt. Does progress, note that she would need 24/7 heavy assist and was living  in a group home prior to admit, so disposition may be  a barrier to d/c plan. Recommend that TOC look for SNFs who can accept, but CIR will follow and monitor for progress. Please contact me with any questions.   Leita Kleine, MS, CCC-SLP Rehab Admissions Coordinator  [phone] (celll) [phone] (office)

## 2024-05-24 NOTE — Progress Notes (Addendum)
 Progress Note     Subjective: Guest caregiver in room during encounter. Patient alert and participates in discussion.  Patient previously change in mental status after receiving fentanyl  50 MCG around 0400. Patient was given 0.4 mg narcan .   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 Tolerated well so far. Reports continued pain that is being helped with medications. Had one BM reported. Reports flatulence. Denies nausea or vomiting. Denies dizziness or headaches.  ROS  Per HPI.  Objective: Vital signs in last 24 hours: Temp:  [97.8 F (36.6 C)-98.7 F (37.1 C)] 97.8 F (36.6 C) (09/17 0839) Pulse Rate:  [95-115] 102 (09/17 0900) Resp:  [11-17] 15 (09/17 0900) BP: (91-117)/(53-77) 108/62 (09/17 0900) SpO2:  [91 %-100 %] 93 % (09/17 0900) Last BM Date : 05/24/24  Intake/Output from previous day: No intake/output data recorded. Intake/Output this shift: No intake/output data recorded.  PE: General: Pleasant female who is laying in bed in NAD. HEENT: Forehead laceration covered with dressing. Dressing clean. EOMI Neck: Collar present  Heart: Mild tachycardia during encounter (102). Palpable radial and pedal pulses bilaterally Lungs: Respiratory effort nonlabored Abd: Soft, NT, ND. MS: Mild edema of L foot/ankle, CAM boot currently off. Skin: Warm and dry with no masses, lesions, or rashes Psych: A&Ox4.   Lab Results:  Recent Labs    05/23/24 0929 05/23/24 0955 05/24/24 0449  WBC 13.2*  --  9.9  HGB 12.1 13.9 10.2*  HCT 38.5 41.0 31.7*  PLT 216  --  185   BMET Recent Labs    05/23/24 1200 05/24/24 0449  NA 134* 132*  K 5.1 4.2  CL 105 102  CO2 17* 19*  GLUCOSE 148* 139*  BUN 17 23*  CREATININE 0.71 0.69  CALCIUM  8.3* 7.9*   PT/INR Recent Labs    05/23/24 0929  LABPROT 14.1  INR 1.0   CMP     Component Value Date/Time   NA 132 (L) 05/24/2024 0449   NA 140 06/15/2019 1307   K 4.2  05/24/2024 0449   CL 102 05/24/2024 0449   CO2 19 (L) 05/24/2024 0449   GLUCOSE 139 (H) 05/24/2024 0449   BUN 23 (H) 05/24/2024 0449   BUN 15 06/15/2019 1307   CREATININE 0.69 05/24/2024 0449   CALCIUM  7.9 (L) 05/24/2024 0449   PROT 5.2 (L) 05/23/2024 1200   PROT 6.5 06/15/2019 1307   ALBUMIN 2.5 (L) 05/23/2024 1200   ALBUMIN 3.5 (L) 06/15/2019 1307   AST 26 05/23/2024 1200   ALT 16 05/23/2024 1200   ALKPHOS 40 05/23/2024 1200   BILITOT 0.4 05/23/2024 1200   BILITOT 0.3 06/15/2019 1307   GFRNONAA >60 05/24/2024 0449   GFRAA >60 09/14/2019 0320   Lipase     Component Value Date/Time   LIPASE 28 10/13/2014 1426       Studies/Results: MR CERVICAL SPINE WO CONTRAST Result Date: 05/23/2024 CLINICAL DATA:  Fall down stairs. Multiple fractures of the posterior elements of the cervical spine and T4 and T5 vertebral bodies on CT. EXAM: MRI CERVICAL AND THORACIC SPINE WITHOUT CONTRAST TECHNIQUE: Multiplanar and multiecho pulse sequences of the cervical spine, to include the craniocervical junction and cervicothoracic junction, and the thoracic spine, were obtained without intravenous contrast. COMPARISON:  Same day CT of the cervical and thoracic spine. MRI of the cervical spine 04/09/2020. FINDINGS: MRI CERVICAL SPINE FINDINGS Alignment: Normal. Vertebrae: As seen on CT, there are multiple acute fractures involving the  posterior elements at C3, C5 and C7 with associated marrow and surrounding soft tissue edema. No evidence of acute cervical vertebral fracture or traumatic subluxation. There are mild chronic healed superior endplate compression deformities at T1 and T2 which are unchanged from previous MRI. Cord: Subtle cord T2 hyperintensity at C3-4, potentially reflecting mild cord contusion. There is no cord compression or hemorrhage. The cervical cord is otherwise normal in signal and caliber. No evidence of epidural hematoma. Posterior Fossa, vertebral arteries, paraspinal tissues:  Visualized portions of the posterior fossa appear unremarkable.Bilateral vertebral artery flow voids. Small amount of prevertebral soft tissue edema. As above, there is irregular edema/hemorrhage surrounding the spinous process fractures, especially at C5. In addition, there is a probable subcutaneous hematoma superficial to the ligamentum nuchae and extending from C6-T2, measuring 7.7 cm in length on sagittal image 9/3. Disc levels: C2-3: As correlated with CT, there is interbody and interfacetal ankylosis. No significant spinal stenosis or foraminal narrowing. C3-4: Mild disc bulging. Mild spinal stenosis without cord deformity. No significant foraminal narrowing. C4-5: Small central disc protrusion. No spinal stenosis or significant foraminal narrowing. C5-6: Small central disc protrusion. No spinal stenosis or significant foraminal narrowing. C6-7: Small central disc protrusion. No spinal stenosis or significant foraminal narrowing. C7-T1: Mild facet hypertrophy. No spinal stenosis or significant foraminal narrowing. MRI THORACIC SPINE FINDINGS Technical note: Despite efforts by the technologist and patient, mild motion artifact is present on today's exam and could not be eliminated. This reduces exam sensitivity and specificity. Alignment:  Physiologic. Vertebrae: As demonstrated on CT, there are acute minimally displaced fractures of the T4 and T5 vertebral bodies. The T4 fracture extends into the right facet joint and spinous process on CT, consistent with a Chance fracture. There is mild osseous retropulsion on the right by approximately 3 mm, best seen on the axial images. There is associated marrow and surrounding soft tissue edema. In addition, there is a nondisplaced fracture involving the posterosuperior corner of the T6 vertebral body, best seen on the sagittal images. No acute osseous findings within the lower thoracic spine. Cord: The thoracic cord appears normal in signal and caliber.The conus  medullaris extends to the T12-L1 disc space level. Paraspinal and other soft tissues: Mild paraspinous edema/hemorrhage posteriorly adjacent to the T4 spinous process fracture. As above, subcutaneous hematoma superficial to the ligamentum nuchae extending inferiorly to the T2 level. Disc levels: No significant disc space findings in the upper thoracic spine. T4-5: Mild osseous retropulsion on the right with mild mass effect on the thoracic cord, best seen on the axial images. No significant foraminal narrowing, epidural hematoma or disc herniation. T5-6: No significant findings. T6-7: Loss of disc height with a small partially calcified central disc protrusion. Mild mass effect on the ventral aspect of the cord. There is ample CSF posteriorly. T7-8: Mild disc bulging with anterior osteophytes. No spinal stenosis or significant foraminal narrowing. T8-9: Mild disc bulging with a small calcified superiorly extruded disc fragment. No cord deformity or significant foraminal narrowing. T9-10: No spinal stenosis or significant foraminal narrowing. T10-11: Mild facet hypertrophy. No spinal stenosis or significant foraminal narrowing. T11-12: Mild facet hypertrophy. No spinal stenosis or significant foraminal narrowing. IMPRESSION: 1. Multiple acute fractures involving the posterior elements at C3, C5 and C7 as correlated with earlier CT. Associated paraspinal edema which could reflect associated ligamentous injury. No evidence of acute cervical vertebral fracture or traumatic subluxation. 2. Subtle cord T2 hyperintensity at C3-4, potentially reflecting mild cord contusion. No cervical cord compression, cord hemorrhage or  epidural hematoma. 3. Acute fractures of T4, T5 and T6 vertebral bodies. T4 fracture extends into the posterior elements, consistent with a Chance fracture. Mild osseous retropulsion on the right at T4 with mild mass effect on the thoracic cord. No significant foraminal narrowing or epidural hematoma. 4.  Subcutaneous hematoma superficial to the ligamentum nuchae extending from C6 to the T2 level. 5. Mild thoracic spondylosis as described. Electronically Signed   By: Elsie Perone M.D.   On: 05/23/2024 13:30   MR THORACIC SPINE WO CONTRAST Result Date: 05/23/2024 CLINICAL DATA:  Fall down stairs. Multiple fractures of the posterior elements of the cervical spine and T4 and T5 vertebral bodies on CT. EXAM: MRI CERVICAL AND THORACIC SPINE WITHOUT CONTRAST TECHNIQUE: Multiplanar and multiecho pulse sequences of the cervical spine, to include the craniocervical junction and cervicothoracic junction, and the thoracic spine, were obtained without intravenous contrast. COMPARISON:  Same day CT of the cervical and thoracic spine. MRI of the cervical spine 04/09/2020. FINDINGS: MRI CERVICAL SPINE FINDINGS Alignment: Normal. Vertebrae: As seen on CT, there are multiple acute fractures involving the posterior elements at C3, C5 and C7 with associated marrow and surrounding soft tissue edema. No evidence of acute cervical vertebral fracture or traumatic subluxation. There are mild chronic healed superior endplate compression deformities at T1 and T2 which are unchanged from previous MRI. Cord: Subtle cord T2 hyperintensity at C3-4, potentially reflecting mild cord contusion. There is no cord compression or hemorrhage. The cervical cord is otherwise normal in signal and caliber. No evidence of epidural hematoma. Posterior Fossa, vertebral arteries, paraspinal tissues: Visualized portions of the posterior fossa appear unremarkable.Bilateral vertebral artery flow voids. Small amount of prevertebral soft tissue edema. As above, there is irregular edema/hemorrhage surrounding the spinous process fractures, especially at C5. In addition, there is a probable subcutaneous hematoma superficial to the ligamentum nuchae and extending from C6-T2, measuring 7.7 cm in length on sagittal image 9/3. Disc levels: C2-3: As correlated with  CT, there is interbody and interfacetal ankylosis. No significant spinal stenosis or foraminal narrowing. C3-4: Mild disc bulging. Mild spinal stenosis without cord deformity. No significant foraminal narrowing. C4-5: Small central disc protrusion. No spinal stenosis or significant foraminal narrowing. C5-6: Small central disc protrusion. No spinal stenosis or significant foraminal narrowing. C6-7: Small central disc protrusion. No spinal stenosis or significant foraminal narrowing. C7-T1: Mild facet hypertrophy. No spinal stenosis or significant foraminal narrowing. MRI THORACIC SPINE FINDINGS Technical note: Despite efforts by the technologist and patient, mild motion artifact is present on today's exam and could not be eliminated. This reduces exam sensitivity and specificity. Alignment:  Physiologic. Vertebrae: As demonstrated on CT, there are acute minimally displaced fractures of the T4 and T5 vertebral bodies. The T4 fracture extends into the right facet joint and spinous process on CT, consistent with a Chance fracture. There is mild osseous retropulsion on the right by approximately 3 mm, best seen on the axial images. There is associated marrow and surrounding soft tissue edema. In addition, there is a nondisplaced fracture involving the posterosuperior corner of the T6 vertebral body, best seen on the sagittal images. No acute osseous findings within the lower thoracic spine. Cord: The thoracic cord appears normal in signal and caliber.The conus medullaris extends to the T12-L1 disc space level. Paraspinal and other soft tissues: Mild paraspinous edema/hemorrhage posteriorly adjacent to the T4 spinous process fracture. As above, subcutaneous hematoma superficial to the ligamentum nuchae extending inferiorly to the T2 level. Disc levels: No significant disc space findings  in the upper thoracic spine. T4-5: Mild osseous retropulsion on the right with mild mass effect on the thoracic cord, best seen on the  axial images. No significant foraminal narrowing, epidural hematoma or disc herniation. T5-6: No significant findings. T6-7: Loss of disc height with a small partially calcified central disc protrusion. Mild mass effect on the ventral aspect of the cord. There is ample CSF posteriorly. T7-8: Mild disc bulging with anterior osteophytes. No spinal stenosis or significant foraminal narrowing. T8-9: Mild disc bulging with a small calcified superiorly extruded disc fragment. No cord deformity or significant foraminal narrowing. T9-10: No spinal stenosis or significant foraminal narrowing. T10-11: Mild facet hypertrophy. No spinal stenosis or significant foraminal narrowing. T11-12: Mild facet hypertrophy. No spinal stenosis or significant foraminal narrowing. IMPRESSION: 1. Multiple acute fractures involving the posterior elements at C3, C5 and C7 as correlated with earlier CT. Associated paraspinal edema which could reflect associated ligamentous injury. No evidence of acute cervical vertebral fracture or traumatic subluxation. 2. Subtle cord T2 hyperintensity at C3-4, potentially reflecting mild cord contusion. No cervical cord compression, cord hemorrhage or epidural hematoma. 3. Acute fractures of T4, T5 and T6 vertebral bodies. T4 fracture extends into the posterior elements, consistent with a Chance fracture. Mild osseous retropulsion on the right at T4 with mild mass effect on the thoracic cord. No significant foraminal narrowing or epidural hematoma. 4. Subcutaneous hematoma superficial to the ligamentum nuchae extending from C6 to the T2 level. 5. Mild thoracic spondylosis as described. Electronically Signed   By: Elsie Perone M.D.   On: 05/23/2024 13:30   CT T-SPINE NO CHARGE Result Date: 05/23/2024 CLINICAL DATA:  Fall down stairs. Neck and back pain. Cervical spine fractures. EXAM: CT Thoracic Spine without contrast TECHNIQUE: Multiplanar CT images of the thoracic spine were reconstructed from  contemporary CT of the Chest. RADIATION DOSE REDUCTION: This exam was performed according to the departmental dose-optimization program which includes automated exposure control, adjustment of the mA and/or kV according to patient size and/or use of iterative reconstruction technique. CONTRAST:  No additional COMPARISON:  Same day cervical spine CT. Thoracic spine radiographs 02/07/2019. Chest CT 167183. FINDINGS: Alignment: Normal. Vertebrae: In addition to the separately reported cervical spine injuries, there are acute minimally displaced fractures of the T4 and T5 vertebral bodies. Minimal osseous retropulsion at T4. Fracture extends into the inferior right T4 articulating facet and spinous process. No significant loss of vertebral body height. No other fractures are identified. Paraspinal and other soft tissues: Mild anterior paraspinal soft tissue edema at T4 and T5. No focal hematoma. Additional chest details dictated separately. Disc levels: Mild multilevel thoracic spondylosis with disc bulging, small disc protrusions and endplate osteophytes. IMPRESSION: 1. Acute minimally displaced fractures of the T4 and T5 vertebral bodies with extension into the inferior right T4 articulating facet and spinous process. Minimal osseous retropulsion at T4. The T4 fracture is best categorized as a Chance fracture, potentially unstable. 2. No other acute osseous findings. 3. Mild thoracic spondylosis. 4. Additional chest details dictated separately. See separate cervical spine CT report. 5. These results were called by telephone at the time of interpretation on 05/23/2024 at 11:14 am to provider Prentice Medicus, who verbally acknowledged these results. Electronically Signed   By: Elsie Perone M.D.   On: 05/23/2024 11:14   CT CHEST ABDOMEN PELVIS W CONTRAST Result Date: 05/23/2024 EXAM: CT CHEST, ABDOMEN AND PELVIS WITH CONTRAST 05/23/2024 10:13:43 AM TECHNIQUE: CT of the chest, abdomen and pelvis was performed with the  administration  of intravenous contrast. Multiplanar reformatted images are provided for review. Automated exposure control, iterative reconstruction, and/or weight based adjustment of the mA/kV was utilized to reduce the radiation dose to as low as reasonably achievable. COMPARISON: CT of the chest dated 12/03/2014. CLINICAL HISTORY: Trauma. Triage notes; Patient arrives via PT[facility] for fall down 4-5 steps. Patient with lac to middle of head, right knee abrasion, and abrasion to right forearm. Patient endorses missing a step coming steps at her home. Patient arrives in c-collar placed by fire. Bleeding controlled at time of arrival. FINDINGS: CHEST: MEDIASTINUM AND LYMPH NODES: Heart and pericardium are unremarkable. The central airways are clear. No mediastinal, hilar or axillary lymphadenopathy. LUNGS AND PLEURA: No focal consolidation or pulmonary edema. No pleural effusion or pneumothorax. ABDOMEN AND PELVIS: LIVER: The liver is unremarkable. GALLBLADDER AND BILE DUCTS: Gallbladder is unremarkable. No biliary ductal dilatation. SPLEEN: No acute abnormality. PANCREAS: No acute abnormality. ADRENAL GLANDS: No acute abnormality. KIDNEYS, URETERS AND BLADDER: No stones in the kidneys or ureters. No hydronephrosis. No perinephric or periureteral stranding. Urinary bladder is unremarkable. GI AND BOWEL: Stomach demonstrates no acute abnormality. There is no bowel obstruction. REPRODUCTIVE ORGANS: No acute abnormality. PERITONEUM AND RETROPERITONEUM: No ascites. No free air. VASCULATURE: Aorta is normal in caliber. ABDOMINAL AND PELVIS LYMPH NODES: No lymphadenopathy. REPRODUCTIVE ORGANS: No acute abnormality. BONES AND SOFT TISSUES: Chronic bilateral spondylolysis and slight anterolisthesis present at L5-S1. Healed fracture deformities of the left posterior rib cage. No acute fractures evident. IMPRESSION: 1. No acute abnormality of the chest, abdomen, and pelvis related to the provided clinical history of trauma.  Electronically signed by: Evalene Coho MD 05/23/2024 10:36 AM EDT RP Workstation: [id number]   DG Chest Portable 1 View Result Date: 05/23/2024 EXAM: 1 VIEW XRAY OF THE CHEST 05/23/2024 10:15:00 AM COMPARISON: Radiograph of the chest dated 09/10/2019. CLINICAL HISTORY: Trauma. Level 2 trauma- Fall. FINDINGS: LUNGS AND PLEURA: Low lung volumes. No focal pulmonary opacity. No pulmonary edema. No pleural effusion. No pneumothorax. HEART AND MEDIASTINUM: No acute abnormality of the cardiac and mediastinal silhouettes. BONES AND SOFT TISSUES: Degenerative changes of right shoulder. No acute osseous abnormality. IMPRESSION: 1. No acute process. 2. Low lung volumes. Electronically signed by: Evalene Coho MD 05/23/2024 10:31 AM EDT RP Workstation: [id number]   CT Cervical Spine Wo Contrast Result Date: 05/23/2024 CLINICAL DATA:  Fall down multiple steps. Head laceration and neck pain. EXAM: CT CERVICAL SPINE WITHOUT CONTRAST TECHNIQUE: Multidetector CT imaging of the cervical spine was performed without intravenous contrast. Multiplanar CT image reconstructions were also generated. RADIATION DOSE REDUCTION: This exam was performed according to the departmental dose-optimization program which includes automated exposure control, adjustment of the mA and/or kV according to patient size and/or use of iterative reconstruction technique. COMPARISON:  CT cervical spine 09/08/2019. MRI cervical spine 04/09/2020. FINDINGS: Alignment: Normal. Skull base and vertebrae: There are multiple acute cervical spine fractures. There are nondisplaced fractures involving the base of the C3 spinous process and left lamina. There is partial interbody and interfacetal ankylosis at C2-3. There is a comminuted and mildly displaced fracture of the C5 spinous process and right lamina. There is a mildly displaced fracture of the C7 spinous process. No definite involvement of the facet joints or traumatic subluxation. No evidence of  vertebral body fracture. Soft tissues and spinal canal: No prevertebral fluid or swelling. No visible canal hematoma. There is posterior paraspinal soft tissue swelling adjacent to the fractures of the posterior elements. Disc levels: The disc heights are relatively maintained. There are  paraspinal osteophytes which are most prominent anteriorly at C4-5 and C5-6. As above, partial interbody and interfacetal ankylosis at C2-3. No evidence of large disc herniation or high-grade foraminal narrowing. Upper chest: Chest findings dictated separately. Other: Carotid atherosclerosis. IMPRESSION: 1. Multiple acute cervical spine fractures involving the C3, C5 and C7 posterior elements as described. No evidence of traumatic subluxation. 2. No evidence of vertebral body fracture. 3. No evidence of large disc herniation or high-grade foraminal narrowing. 4. Critical Value/emergent results were called by telephone at the time of interpretation on 05/23/2024 at 10:30 am to provider ANDREW TEE , who verbally acknowledged these results. Electronically Signed   By: Elsie Perone M.D.   On: 05/23/2024 10:31   CT Head Wo Contrast Result Date: 05/23/2024 EXAM: CT HEAD WITHOUT CONTRAST 05/23/2024 10:13:43 AM TECHNIQUE: CT of the head was performed without the administration of intravenous contrast. Automated exposure control, iterative reconstruction, and/or weight based adjustment of the mA/kV was utilized to reduce the radiation dose to as low as reasonably achievable. COMPARISON: 09/08/2019 CLINICAL HISTORY: Trauma. Patient arrives via PTAR for fall down 4-5 steps. Patient with lac to middle of head, right knee abrasion, and abrasion to right forearm. Patient endorses missing a step coming steps at her home. Patient arrives in c-collar placed by fire. Bleeding controlled at time of arrival. Patient having trouble moving fingers and toes, endorses head and neck pain. CBG 188. BP 102/60, HR 104, 98 on room air. FINDINGS: BRAIN AND  VENTRICLES: No acute hemorrhage. No evidence of acute infarct. No hydrocephalus. No extra-axial collection. No mass effect or midline shift. Moderate generalized cerebral volume loss present, which is advanced for the patient's age. Mild periventricular white matter disease. ORBITS: No acute abnormality. SINUSES: No acute abnormality. SOFT TISSUES AND SKULL: Right forehead laceration and soft tissue swelling. No skull fracture. IMPRESSION: 1. No acute intracranial abnormality. 2. Moderate generalized cerebral volume loss, advanced for the patient's age. 3. Mild periventricular white matter disease. 4. Right forehead laceration and soft tissue swelling. Electronically signed by: Evalene Coho MD 05/23/2024 10:30 AM EDT RP Workstation: [id number]    Anti-infectives: Anti-infectives (From admission, onward)    None        Assessment/Plan Fall -Mild tachycardia during encounter -Na 132 from 134; Will discuss sodium levels with MD. 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 Lanis consulted - CTO brace for now and no surgery planned acutely  - Continue to mobilize with PT/OT, multimodal pain control Forehead laceration  - ENT consulted, closure per Dr. Luciano on 9/17 - Dressing to remain intact 5-7 days - Office follow up visit recommended in 7-10 days Recent left foot/ankle fracture  - CAM boot  T2DM  - SSI Schizophrenia and intellectual delay  - Continue home meds  FEN: CLD. May advance this afternoon if continued tolerance VTE: SCD to RLE, will start LMWH when cleared by NS ID: No current abx, caregiver reported Tdap was UTD    LOS: 1 day   I reviewed specialist notes, nursing notes, last 24 h vitals and pain scores, last 48 h intake and output, last 24 h labs and trends, and last 24 h imaging results.  This care required moderate level of medical decision making.    Marjorie Carlyon Favre,  [facility] 05/24/2024, 11:16 AM Please see Amion for pager number during day hours 7:00am-4:30pm

## 2024-05-24 NOTE — ED Notes (Signed)
 I had no success, getting patient blood. The Nurse was informed.

## 2024-05-24 NOTE — Progress Notes (Addendum)
 Patient with soft BP's intermittently today.  BP 95/56 on last check. HR 96. Afebrile.  She received IVF bolus this AM.  Received atenolol  and lisinopril  this morning.  She required narcan  earlier after fentanyl . She has not received prn narcotic pain medication since that time. She is on lower end of scale of prn pain medication here. Maximize non-narcotics. (Discussed with RN to notify if pain not adequately controlled with current medications).  Seen at bedside. No lightheadedness or dizziness. C-Collar in place. CTO at bedside. Lungs CTA b/l. Abd soft. No external signs of bleeding. Will need to have staff exchange C-Collar for CTO that is at bedside as NSGY note recommends CTO brace at all times.  Good UOP. 650cc in cannister since start of shift.  Hgb 13.9 --> 10.2.   Start mIVF at 125ml/hr. Strict I/O. Monitor UOP. Recheck CBC now and in AM. Hold home BP medications for now. Could be a neurogenic component as well given C-spine fx's with central cord syndrome. Will notify my attending as well. Patient, RN and family updated at bedside. Repeat BP improved to 103/55.   Alexandria Harvey , [facility] 05/24/2024, 3:31 PM Please see Amion for pager number during day hours 7:00am-4:30pm

## 2024-05-24 NOTE — Evaluation (Signed)
 Physical Therapy Evaluation Patient Details Name: Alexandria Harvey MRN: [id number] DOB: February 11, 1981 Today's Date: 05/24/2024  History of Present Illness  43 year old female presented 05/23/24 S/P fall with forehead laceration and C3, C5, C7 FXs with central cord type SCI. T4-5 fxs Patient has known left foot/ankle fracture in a CAM boot and lost her footing while ambulating. PMH significant for intellectual disability, schizophrenia, dropped head syndrome, HTN, seizures, and diabetes.  Clinical Impression   Pt admitted secondary to problem above with deficits below. PTA patient apparently lived in a group home (based on her description, but could not fully answer questions re: home setup). She was ambulatory with recent Lt ankle fracture and wearing a camboot.  Pt currently requires +2 total assist for all mobility and is very painful with mobility. She had toe wiggling bil (L less than R), but could not flex either knee with RLE pushing into extension with PROM. Anticipate patient will benefit from PT to address problems listed below. Will continue to follow acutely to maximize functional mobility, independence, and safety.  If pt has 24 hr caregivers, could benefit from post-acute inpatient therapies >3 hrs/day.          If plan is discharge home, recommend the following: Two people to help with walking and/or transfers;Assist for transportation;Help with stairs or ramp for entrance;Supervision due to cognitive status   Can travel by private vehicle        Equipment Recommendations Wheelchair (measurements PT);Wheelchair cushion (measurements PT);Hoyer lift;Hospital bed  Recommendations for Other Services  Rehab consult    Functional Status Assessment Patient has had a recent decline in their functional status and demonstrates the ability to make significant improvements in function in a reasonable and predictable amount of time.     Precautions / Restrictions Precautions Precautions:  Cervical Precaution Booklet Issued: No Recall of Precautions/Restrictions: Impaired Required Braces or Orthoses: Cervical Brace;Other Brace Cervical Brace: Hard collar;At all times Other Brace: CAM boot L leg Restrictions Weight Bearing Restrictions Per Provider Order: No      Mobility  Bed Mobility Overal bed mobility: Needs Assistance Bed Mobility: Rolling, Sidelying to Sit, Sit to Sidelying Rolling: Total assist, +2 for physical assistance, +2 for safety/equipment Sidelying to sit: Total assist, +2 for physical assistance, +2 for safety/equipment     Sit to sidelying: Total assist, +2 for physical assistance, +2 for safety/equipment General bed mobility comments: Total A of 2 for all mobility, no current ability to assist    Transfers                   General transfer comment: deferred due to current level and pain    Ambulation/Gait                  Stairs            Wheelchair Mobility     Tilt Bed    Modified Rankin (Stroke Patients Only)       Balance Overall balance assessment: Needs assistance Sitting-balance support: Bilateral upper extremity supported, Feet supported Sitting balance-Leahy Scale: Poor Sitting balance - Comments: reliant on external assist                                     Pertinent Vitals/Pain Pain Assessment Pain Assessment: Faces Faces Pain Scale: Hurts worst Pain Location: all over Pain Descriptors / Indicators: Grimacing, Guarding, Discomfort, Moaning Pain Intervention(s): Limited activity  within patient's tolerance, Monitored during session, Repositioned    Home Living Family/patient expects to be discharged to:: Group home Living Arrangements: Group Home Available Help at Discharge: Available 24 hours/day               Additional Comments: Unable to provide any further information    Prior Function Prior Level of Function : History of Falls (last six months)              Mobility Comments: could not provide, assumed to be walking without assist ADLs Comments: could not provide, appears to be able to complete basic ADLs     Extremity/Trunk Assessment   Upper Extremity Assessment Upper Extremity Assessment: Defer to OT evaluation RUE Deficits / Details: reports numbness throughout, could not squeeze hand, trace activation noted in the forearm RUE Sensation: decreased light touch RUE Coordination: decreased fine motor;decreased gross motor LUE Deficits / Details: reports numbness throughout, minimal activation noted at bicep LUE Sensation: decreased light touch LUE Coordination: decreased fine motor;decreased gross motor    Lower Extremity Assessment Lower Extremity Assessment: RLE deficits/detail;LLE deficits/detail RLE Deficits / Details: able to wiggle toes; when asked to flex knee, she pushed into extension; +extensor tone on PROM RLE Sensation:  (denied numbness; in too much pain/crying to accurately assess) LLE Deficits / Details: very little toe movement or LE movement noted LLE Sensation:  (denied numbness; in too much pain/crying to accurately assess)    Cervical / Trunk Assessment Cervical / Trunk Assessment: Other exceptions (neck fx's)  Communication   Communication Communication: Impaired Factors Affecting Communication: Reduced clarity of speech    Cognition Arousal: Alert Behavior During Therapy: Flat affect   PT - Cognitive impairments: History of cognitive impairments                       PT - Cognition Comments: no family present Following commands: Impaired Following commands impaired: Follows one step commands inconsistently, Follows one step commands with increased time     Cueing Cueing Techniques: Verbal cues, Gestural cues, Tactile cues, Visual cues     General Comments General comments (skin integrity, edema, etc.): HR up to 123 sitting EOB, though patient screaming    Exercises      Assessment/Plan    PT Assessment Patient needs continued PT services  PT Problem List Decreased strength;Decreased activity tolerance;Decreased balance;Decreased mobility;Decreased cognition;Decreased knowledge of use of DME;Decreased knowledge of precautions;Impaired sensation;Impaired tone;Obesity;Pain       PT Treatment Interventions DME instruction;Gait training;Functional mobility training;Therapeutic activities;Therapeutic exercise;Balance training;Neuromuscular re-education;Cognitive remediation;Patient/family education;Wheelchair mobility training    PT Goals (Current goals can be found in the Care Plan section)  Acute Rehab PT Goals Patient Stated Goal: decrease pain PT Goal Formulation: With patient Time For Goal Achievement: 06/07/24 Potential to Achieve Goals: Good    Frequency Min 3X/week     Co-evaluation PT/OT/SLP Co-Evaluation/Treatment: Yes Reason for Co-Treatment: Complexity of the patient's impairments (multi-system involvement);Necessary to address cognition/behavior during functional activity;For patient/therapist safety;To address functional/ADL transfers PT goals addressed during session: Mobility/safety with mobility;Balance;Strengthening/ROM OT goals addressed during session: ADL's and self-care;Proper use of Adaptive equipment and DME;Strengthening/ROM       AM-PAC PT 6 Clicks Mobility  Outcome Measure Help needed turning from your back to your side while in a flat bed without using bedrails?: Total Help needed moving from lying on your back to sitting on the side of a flat bed without using bedrails?: Total Help needed moving to and from a bed  to a chair (including a wheelchair)?: Total Help needed standing up from a chair using your arms (e.g., wheelchair or bedside chair)?: Total Help needed to walk in hospital room?: Total Help needed climbing 3-5 steps with a railing? : Total 6 Click Score: 6    End of Session Equipment Utilized During  Treatment: Cervical collar;Oxygen Activity Tolerance: Patient limited by pain Patient left: in bed;with call bell/phone within reach;with bed alarm set Nurse Communication: Mobility status;Need for lift equipment;Other (comment) (need for air mattress?) PT Visit Diagnosis: History of falling (Z91.81);Muscle weakness (generalized) (M62.81);Difficulty in walking, not elsewhere classified (R26.2);Other symptoms and signs involving the nervous system (R29.898);Pain Pain - part of body:  (all over)    Time: 9070-9046 PT Time Calculation (min) (ACUTE ONLY): 24 min   Charges:   PT Evaluation $PT Eval Low Complexity: 1 Low   PT General Charges $$ ACUTE PT VISIT: 1 Visit          Macario RAMAN, PT Acute Rehabilitation Services  Office [phone]   Macario SHAUNNA Soja 05/24/2024, 11:44 AM

## 2024-05-24 NOTE — Procedures (Signed)
 FACIAL PLASTIC SURGERY PROCEDURE NOTE  PROCEDURE: Repair complex forehead laceration 14cm;  3 cpt codes: 86868, 13132, 13132  Procedure in detail:   Patient presents with a large complex 14 cm forehead laceration full-thickness exposing the calvarium.  After verbal consent was obtained from the patient's family can guardian the patient with premed with IV fentanyl .  Local anesthetic 2% lidocaine  1-200,000 epinephrine  mixed with sodium bicarbonate ; a total of 10 mL.  Wound was then copiously irrigated with sterile saline and prepped with Betadine.  I then proceeded with complex closure of 14 cm for laceration full-thickness.  Buried interrupted 4-0 Vicryl sutures were used to close the galeal and frontal frontalis muscle layer.  Next interrupted 5-0 Vicryl Rapide and 5-0 fast gut sutures were used to close the forehead skin up to the hairline.  The Hairline component of the laceration was subsequently closed with staples.  Wounds were copiously cleansed with saline dressed with bacitracin, Telfa and Steri-Strips.  Recommendation: - Dressing to remain intact 5-7 days - Office postop visit in 7-10 days  Electronically signed by:  Elspeth Coddington, MD  Staff Physician Facial Plastic & Reconstructive Surgery Otolaryngology - Head and Neck Surgery [facility] [place]

## 2024-05-24 NOTE — ED Notes (Addendum)
 Pt desat to low 80s after being given fentanyl . Pt placed on 2L O2 with improvement to 92%. Pt less responsive and having decreased respirations. Pt given narcan . Pt became more responsive and had increased resp.

## 2024-05-24 NOTE — Progress Notes (Signed)
 FACIAL PLASTIC SURGERY POSTOP NOTE  POD#1 S/P washout and repair complex forehead laceration Admitted to trauma wards for cervical spine injuries.  S: Doing well. Endorses headache  O: Dressing intact. No hematoma or bleeding.  A/P: POD#1 s/p washout and repair complex forehead laceration. Doing well  Dressing on x 7 days Postop office visit in 7-10 days; will require staple removal from scalp  Electronically signed by:  Elspeth Coddington, MD  Facial Plastic & Reconstructive Surgery Otolaryngology - Head and Neck Surgery [facility] [place]

## 2024-05-24 NOTE — Progress Notes (Signed)
   05/24/24 0617  Vitals  Temp 98.4 F (36.9 C)  Temp Source Oral  BP 111/74  MAP (mmHg) 83  BP Location Right Arm  BP Method Automatic  Patient Position (if appropriate) Lying  Pulse Rate 96  Pulse Rate Source Monitor  Resp 14  MEWS COLOR  MEWS Score Color Green  Oxygen Therapy  SpO2 97 %  O2 Device Nasal Cannula  O2 Flow Rate (L/min) 4 L/min  MEWS Score  MEWS Temp 0  MEWS Systolic 0  MEWS Pulse 0  MEWS RR 0  MEWS LOC 0  MEWS Score 0   Patient arrived to unit via ED. Pt Aox3 (person, place and situation). Patient oriented to unit, call light in reach, bed in lowest position and floor matts applied.

## 2024-05-25 LAB — CBC
HCT: 30.1 % — ABNORMAL LOW (ref 36.0–46.0)
Hemoglobin: 9.6 g/dL — ABNORMAL LOW (ref 12.0–15.0)
MCH: 28.2 pg (ref 26.0–34.0)
MCHC: 31.9 g/dL (ref 30.0–36.0)
MCV: 88.5 fL (ref 80.0–100.0)
Platelets: 162 K/uL (ref 150–400)
RBC: 3.4 MIL/uL — ABNORMAL LOW (ref 3.87–5.11)
RDW: 17 % — ABNORMAL HIGH (ref 11.5–15.5)
WBC: 9.6 K/uL (ref 4.0–10.5)
nRBC: 0 % (ref 0.0–0.2)

## 2024-05-25 LAB — BASIC METABOLIC PANEL WITH GFR
Anion gap: 11 (ref 5–15)
BUN: 9 mg/dL (ref 6–20)
CO2: 20 mmol/L — ABNORMAL LOW (ref 22–32)
Calcium: 7.7 mg/dL — ABNORMAL LOW (ref 8.9–10.3)
Chloride: 105 mmol/L (ref 98–111)
Creatinine, Ser: 0.53 mg/dL (ref 0.44–1.00)
GFR, Estimated: >60 mL/min (ref >60)
Glucose, Bld: 135 mg/dL — ABNORMAL HIGH (ref 70–99)
Potassium: 4.2 mmol/L (ref 3.5–5.1)
Sodium: 136 mmol/L (ref 135–145)

## 2024-05-25 LAB — GLUCOSE, CAPILLARY
Glucose-Capillary: 133 mg/dL — ABNORMAL HIGH (ref 70–99)
Glucose-Capillary: 368 mg/dL — ABNORMAL HIGH (ref 70–99)
Glucose-Capillary: 284 mg/dL — ABNORMAL HIGH (ref 70–99)
Glucose-Capillary: 156 mg/dL — ABNORMAL HIGH (ref 70–99)

## 2024-05-25 NOTE — NC FL2 (Signed)
 [place]  MEDICAID FL2 LEVEL OF CARE FORM     IDENTIFICATION  Patient Name: Alexandria Harvey Birthdate: [birth year] Sex: female Admission Date (Current Location): 05/23/2024  [place] and [place] Number:  Producer, television/film/video and Address:  [facility], [address] [place], [place] [address]      Provider Number: [id number]  Attending Physician Name and Address:  Md, Trauma, MD  Relative Name and Phone Number:  Devra Garre (Legal Guardian)  [phone] (Mobile)    Current Level of Care: Hospital Recommended Level of Care: Skilled Nursing Facility Prior Approval Number:    Date Approved/Denied:   PASRR Number: [id number]  Discharge Plan:      Current Diagnoses: Patient Active Problem List   Diagnosis Date Noted   Central cord syndrome (HCC) 05/23/2024   Pressure injury of skin 09/10/2019   Acute respiratory failure with hypoxia (HCC) 09/10/2019   COVID-19 virus infection 09/09/2019   Elevated AST (SGOT) 09/09/2019   Thrombocytopenia (HCC) 09/09/2019   Positive D dimer 09/09/2019   Encephalopathy acute 09/09/2019   Sepsis due to pneumonia (HCC) 09/08/2019   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   Agitation    Moderate intellectual disabilities 01/25/2015   Schizoaffective disorder, depressive type (HCC) 01/18/2015   Type 2 diabetes mellitus without complication (HCC)    Hypoglycemia 11/30/2014   Allergic rhinitis 12/19/2009   DYSPEPSIA 09/26/2009   Constipation 09/26/2009   TINEA PEDIS 12/17/2008   HYPERLIPIDEMIA 01/08/2007   Essential hypertension 01/08/2007   Seizure disorder (HCC) 01/08/2007    Orientation RESPIRATION BLADDER Height & Weight     Self, Time, Situation  O2 (2L) External catheter Weight: 154 lb 1.6 oz (69.9 kg) Height:  5' 2.5 (158.8 cm)  BEHAVIORAL SYMPTOMS/MOOD NEUROLOGICAL BOWEL NUTRITION STATUS      Incontinent Diet (reg)  AMBULATORY STATUS COMMUNICATION OF NEEDS Skin   Extensive Assist Verbally Other  (Comment) (R upper face - staples)                       Personal Care Assistance Level of Assistance  Bathing, Feeding, Dressing Bathing Assistance: Maximum assistance Feeding assistance: Maximum assistance Dressing Assistance: Maximum assistance     Functional Limitations Info             SPECIAL CARE FACTORS FREQUENCY  PT (By licensed PT), OT (By licensed OT)     PT Frequency: 5 times per week OT Frequency: 5 times per week            Contractures      Additional Factors Info  Code Status, Allergies Code Status Info: full Allergies Info: Haloperidol Lactate, Shrimp (Shellfish Allergy), Strawberry (Diagnostic)           Current Medications (05/25/2024):  This is the current hospital active medication list Current Facility-Administered Medications  Medication Dose Route Frequency Provider Last Rate Last Admin   0.9 %  sodium chloride  infusion   Intravenous Continuous Maczis, Michael M, PA-C 125 mL/hr at 05/24/24 1520 Rate Change at 05/24/24 1520   acetaminophen  (TYLENOL ) tablet 1,000 mg  1,000 mg Oral Q6H Vicci Sor R, PA-C   1,000 mg at 05/25/24 0541   [START ON 05/26/2024] atenolol  (TENORMIN ) tablet 50 mg  50 mg Oral Q breakfast Maczis, Michael M, PA-C       atorvastatin  (LIPITOR ) tablet 80 mg  80 mg Oral QHS Johnson, Kelly R, PA-C   80 mg at 05/24/24 2104   benztropine  (COGENTIN ) tablet 1 mg  1  mg Oral BID Johnson, Kelly R, PA-C   1 mg at 05/25/24 9173   cloZAPine  (CLOZARIL ) tablet 100 mg  100 mg Oral BID Vicci Burnard SAUNDERS, PA-C   100 mg at 05/25/24 9173   dicyclomine  (BENTYL ) capsule 10 mg  10 mg Oral Q8H PRN Johnson, Kelly R, PA-C       divalproex  (DEPAKOTE ) DR tablet 1,000 mg  1,000 mg Oral QPM Johnson, Kelly R, PA-C   1,000 mg at 05/24/24 1718   divalproex  (DEPAKOTE ) DR tablet 500 mg  500 mg Oral q morning Vicci Burnard SAUNDERS, PA-C   500 mg at 05/25/24 9173   docusate sodium  (COLACE) capsule 100 mg  100 mg Oral BID Johnson, Kelly R, PA-C   100 mg at  05/25/24 0825   ferrous sulfate  tablet 325 mg  325 mg Oral Q breakfast Johnson, Kelly R, PA-C   325 mg at 05/25/24 0825   gabapentin  (NEURONTIN ) capsule 300 mg  300 mg Oral TID Johnson, Kelly R, PA-C   300 mg at 05/25/24 0825   hydrALAZINE  (APRESOLINE ) injection 10 mg  10 mg Intravenous Q2H PRN Johnson, Kelly R, PA-C       HYDROmorphone  (DILAUDID ) injection 0.5 mg  0.5 mg Intravenous Q2H PRN Johnson, Kelly R, PA-C   0.5 mg at 05/23/24 2049   insulin  aspart (novoLOG ) injection 0-15 Units  0-15 Units Subcutaneous TID WC Johnson, Kelly R, PA-C   2 Units at 05/25/24 0827   insulin  aspart (novoLOG ) injection 0-5 Units  0-5 Units Subcutaneous QHS Vicci Burnard SAUNDERS, PA-C       [START ON 05/26/2024] lisinopril  (ZESTRIL ) tablet 2.5 mg  2.5 mg Oral Q breakfast Maczis, Michael M, PA-C       LORazepam  (ATIVAN ) tablet 0.5 mg  0.5 mg Oral Q6H PRN Johnson, Kelly R, PA-C       lubiprostone  (AMITIZA ) capsule 24 mcg  24 mcg Oral Q breakfast Johnson, Kelly R, PA-C   24 mcg at 05/25/24 9173   methocarbamol  (ROBAXIN ) tablet 1,000 mg  1,000 mg Oral Q8H Johnson, Kelly R, PA-C   1,000 mg at 05/25/24 0840   Or   methocarbamol  (ROBAXIN ) injection 1,000 mg  1,000 mg Intravenous Q8H Johnson, Kelly R, PA-C   1,000 mg at 05/23/24 1710   metoprolol  tartrate (LOPRESSOR ) injection 5 mg  5 mg Intravenous Q6H PRN Johnson, Kelly R, PA-C       naloxone  (NARCAN ) injection 0.4 mg  0.4 mg Intravenous PRN Sebastian Moles, MD   0.4 mg at 05/24/24 0438   ondansetron  (ZOFRAN -ODT) disintegrating tablet 4 mg  4 mg Oral Q6H PRN Johnson, Kelly R, PA-C       Or   ondansetron  (ZOFRAN ) injection 4 mg  4 mg Intravenous Q6H PRN Johnson, Kelly R, PA-C   4 mg at 05/23/24 1710   oxyCODONE  (Oxy IR/ROXICODONE ) immediate release tablet 2.5-5 mg  2.5-5 mg Oral Q4H PRN Maczis, Michael M, PA-C       pantoprazole  (PROTONIX ) EC tablet 40 mg  40 mg Oral Daily Vicci Burnard SAUNDERS, PA-C   40 mg at 05/25/24 9173   polyethylene glycol (MIRALAX  / GLYCOLAX ) packet 17 g   17 g Oral Daily PRN Johnson, Kelly R, PA-C       polyethylene glycol (MIRALAX  / GLYCOLAX ) packet 17 g  17 g Oral BID Vicci Burnard SAUNDERS, PA-C   17 g at 05/25/24 0825   simethicone  (MYLICON) chewable tablet 80 mg  80 mg Oral BID Vicci Burnard SAUNDERS, PA-C   80  mg at 05/25/24 0825   [START ON 05/27/2024] Vitamin D  (Ergocalciferol ) (DRISDOL ) 1.25 MG (50000 UNIT) capsule 50,000 Units  50,000 Units Oral Weekly Vicci Burnard SAUNDERS, PA-C         Discharge Medications: Please see discharge summary for a list of discharge medications.  Relevant Imaging Results:  Relevant Lab Results:   Additional Information SS #: [id number]  Benjaman Artman E Shermaine Rivet, LCSW

## 2024-05-25 NOTE — Progress Notes (Signed)
 Orthopedic Tech Progress Note Patient Details:  Alexandria Harvey July 06, 1981 [id number]  Ortho Devices Type of Ortho Device: Prafo boot/shoe Ortho Device/Splint Location: BLE Ortho Device/Splint Interventions: Ordered, Adjustment, Application   Post Interventions Patient Tolerated: Well Instructions Provided: Care of device  Jayziah Bankhead A Abhi Moccia 05/25/2024, 5:18 PM

## 2024-05-25 NOTE — Progress Notes (Signed)
 Subjective: CC: Stable areas of pain. Tolerating diet with assistance of staff to feed her without n/v or abdominal pain. Passing flatus. No BM. Voidng with good uop. Scheduled to work with therapies today.   Afebrile. HR 90's. BP improved to 107/59 this AM. BP meds held.   Objective: Vital signs in last 24 hours: Temp:  [97.9 F (36.6 C)-99.9 F (37.7 C)] 98.2 F (36.8 C) (09/18 0800) Pulse Rate:  [91-98] 97 (09/18 0800) Resp:  [14-20] 18 (09/18 0800) BP: (92-107)/(54-68) 107/59 (09/18 0800) SpO2:  [92 %-98 %] 92 % (09/18 0800) Last BM Date : 05/24/24  Intake/Output from previous day: 09/17 0701 - 09/18 0700 In: 720 [P.O.:720] Out: 1700 [Urine:1700] Intake/Output this shift: No intake/output data recorded.  PE: General: Pleasant female who is laying in bed in NAD. HEENT: Forehead laceration covered with dressing. Dressing clean Neck: CTO in place Heart: Reg, DP 2+ b/l Lungs: CTA b/l. Normal rate and effort.  Abd: Soft, NT, ND. MS: No LE edema Skin: Warm and dry  Neuro: Stable exam from neuro with Flicker movement BUEs with sensation intact and Lower extremities grossly 4/5 with sensation intact Psych: A&Ox4.  Lab Results:  Recent Labs    05/24/24 0449 05/24/24 1507  WBC 9.9 11.8*  HGB 10.2* 10.1*  HCT 31.7* 31.0*  PLT 185 168   BMET Recent Labs    05/23/24 1200 05/24/24 0449  NA 134* 132*  K 5.1 4.2  CL 105 102  CO2 17* 19*  GLUCOSE 148* 139*  BUN 17 23*  CREATININE 0.71 0.69  CALCIUM  8.3* 7.9*   PT/INR Recent Labs    05/23/24 0929  LABPROT 14.1  INR 1.0   CMP     Component Value Date/Time   NA 132 (L) 05/24/2024 0449   NA 140 06/15/2019 1307   K 4.2 05/24/2024 0449   CL 102 05/24/2024 0449   CO2 19 (L) 05/24/2024 0449   GLUCOSE 139 (H) 05/24/2024 0449   BUN 23 (H) 05/24/2024 0449   BUN 15 06/15/2019 1307   CREATININE 0.69 05/24/2024 0449   CALCIUM  7.9 (L) 05/24/2024 0449   PROT 5.2 (L) 05/23/2024 1200   PROT 6.5 06/15/2019  1307   ALBUMIN 2.5 (L) 05/23/2024 1200   ALBUMIN 3.5 (L) 06/15/2019 1307   AST 26 05/23/2024 1200   ALT 16 05/23/2024 1200   ALKPHOS 40 05/23/2024 1200   BILITOT 0.4 05/23/2024 1200   BILITOT 0.3 06/15/2019 1307   GFRNONAA >60 05/24/2024 0449   GFRAA >60 09/14/2019 0320   Lipase     Component Value Date/Time   LIPASE 28 10/13/2014 1426    Studies/Results: MR CERVICAL SPINE WO CONTRAST Result Date: 05/23/2024 CLINICAL DATA:  Fall down stairs. Multiple fractures of the posterior elements of the cervical spine and T4 and T5 vertebral bodies on CT. EXAM: MRI CERVICAL AND THORACIC SPINE WITHOUT CONTRAST TECHNIQUE: Multiplanar and multiecho pulse sequences of the cervical spine, to include the craniocervical junction and cervicothoracic junction, and the thoracic spine, were obtained without intravenous contrast. COMPARISON:  Same day CT of the cervical and thoracic spine. MRI of the cervical spine 04/09/2020. FINDINGS: MRI CERVICAL SPINE FINDINGS Alignment: Normal. Vertebrae: As seen on CT, there are multiple acute fractures involving the posterior elements at C3, C5 and C7 with associated marrow and surrounding soft tissue edema. No evidence of acute cervical vertebral fracture or traumatic subluxation. There are mild chronic healed superior endplate compression deformities at T1 and T2 which  are unchanged from previous MRI. Cord: Subtle cord T2 hyperintensity at C3-4, potentially reflecting mild cord contusion. There is no cord compression or hemorrhage. The cervical cord is otherwise normal in signal and caliber. No evidence of epidural hematoma. Posterior Fossa, vertebral arteries, paraspinal tissues: Visualized portions of the posterior fossa appear unremarkable.Bilateral vertebral artery flow voids. Small amount of prevertebral soft tissue edema. As above, there is irregular edema/hemorrhage surrounding the spinous process fractures, especially at C5. In addition, there is a probable subcutaneous  hematoma superficial to the ligamentum nuchae and extending from C6-T2, measuring 7.7 cm in length on sagittal image 9/3. Disc levels: C2-3: As correlated with CT, there is interbody and interfacetal ankylosis. No significant spinal stenosis or foraminal narrowing. C3-4: Mild disc bulging. Mild spinal stenosis without cord deformity. No significant foraminal narrowing. C4-5: Small central disc protrusion. No spinal stenosis or significant foraminal narrowing. C5-6: Small central disc protrusion. No spinal stenosis or significant foraminal narrowing. C6-7: Small central disc protrusion. No spinal stenosis or significant foraminal narrowing. C7-T1: Mild facet hypertrophy. No spinal stenosis or significant foraminal narrowing. MRI THORACIC SPINE FINDINGS Technical note: Despite efforts by the technologist and patient, mild motion artifact is present on today's exam and could not be eliminated. This reduces exam sensitivity and specificity. Alignment:  Physiologic. Vertebrae: As demonstrated on CT, there are acute minimally displaced fractures of the T4 and T5 vertebral bodies. The T4 fracture extends into the right facet joint and spinous process on CT, consistent with a Chance fracture. There is mild osseous retropulsion on the right by approximately 3 mm, best seen on the axial images. There is associated marrow and surrounding soft tissue edema. In addition, there is a nondisplaced fracture involving the posterosuperior corner of the T6 vertebral body, best seen on the sagittal images. No acute osseous findings within the lower thoracic spine. Cord: The thoracic cord appears normal in signal and caliber.The conus medullaris extends to the T12-L1 disc space level. Paraspinal and other soft tissues: Mild paraspinous edema/hemorrhage posteriorly adjacent to the T4 spinous process fracture. As above, subcutaneous hematoma superficial to the ligamentum nuchae extending inferiorly to the T2 level. Disc levels: No  significant disc space findings in the upper thoracic spine. T4-5: Mild osseous retropulsion on the right with mild mass effect on the thoracic cord, best seen on the axial images. No significant foraminal narrowing, epidural hematoma or disc herniation. T5-6: No significant findings. T6-7: Loss of disc height with a small partially calcified central disc protrusion. Mild mass effect on the ventral aspect of the cord. There is ample CSF posteriorly. T7-8: Mild disc bulging with anterior osteophytes. No spinal stenosis or significant foraminal narrowing. T8-9: Mild disc bulging with a small calcified superiorly extruded disc fragment. No cord deformity or significant foraminal narrowing. T9-10: No spinal stenosis or significant foraminal narrowing. T10-11: Mild facet hypertrophy. No spinal stenosis or significant foraminal narrowing. T11-12: Mild facet hypertrophy. No spinal stenosis or significant foraminal narrowing. IMPRESSION: 1. Multiple acute fractures involving the posterior elements at C3, C5 and C7 as correlated with earlier CT. Associated paraspinal edema which could reflect associated ligamentous injury. No evidence of acute cervical vertebral fracture or traumatic subluxation. 2. Subtle cord T2 hyperintensity at C3-4, potentially reflecting mild cord contusion. No cervical cord compression, cord hemorrhage or epidural hematoma. 3. Acute fractures of T4, T5 and T6 vertebral bodies. T4 fracture extends into the posterior elements, consistent with a Chance fracture. Mild osseous retropulsion on the right at T4 with mild mass effect on the thoracic  cord. No significant foraminal narrowing or epidural hematoma. 4. Subcutaneous hematoma superficial to the ligamentum nuchae extending from C6 to the T2 level. 5. Mild thoracic spondylosis as described. Electronically Signed   By: Elsie Perone M.D.   On: 05/23/2024 13:30   MR THORACIC SPINE WO CONTRAST Result Date: 05/23/2024 CLINICAL DATA:  Fall down stairs.  Multiple fractures of the posterior elements of the cervical spine and T4 and T5 vertebral bodies on CT. EXAM: MRI CERVICAL AND THORACIC SPINE WITHOUT CONTRAST TECHNIQUE: Multiplanar and multiecho pulse sequences of the cervical spine, to include the craniocervical junction and cervicothoracic junction, and the thoracic spine, were obtained without intravenous contrast. COMPARISON:  Same day CT of the cervical and thoracic spine. MRI of the cervical spine 04/09/2020. FINDINGS: MRI CERVICAL SPINE FINDINGS Alignment: Normal. Vertebrae: As seen on CT, there are multiple acute fractures involving the posterior elements at C3, C5 and C7 with associated marrow and surrounding soft tissue edema. No evidence of acute cervical vertebral fracture or traumatic subluxation. There are mild chronic healed superior endplate compression deformities at T1 and T2 which are unchanged from previous MRI. Cord: Subtle cord T2 hyperintensity at C3-4, potentially reflecting mild cord contusion. There is no cord compression or hemorrhage. The cervical cord is otherwise normal in signal and caliber. No evidence of epidural hematoma. Posterior Fossa, vertebral arteries, paraspinal tissues: Visualized portions of the posterior fossa appear unremarkable.Bilateral vertebral artery flow voids. Small amount of prevertebral soft tissue edema. As above, there is irregular edema/hemorrhage surrounding the spinous process fractures, especially at C5. In addition, there is a probable subcutaneous hematoma superficial to the ligamentum nuchae and extending from C6-T2, measuring 7.7 cm in length on sagittal image 9/3. Disc levels: C2-3: As correlated with CT, there is interbody and interfacetal ankylosis. No significant spinal stenosis or foraminal narrowing. C3-4: Mild disc bulging. Mild spinal stenosis without cord deformity. No significant foraminal narrowing. C4-5: Small central disc protrusion. No spinal stenosis or significant foraminal narrowing.  C5-6: Small central disc protrusion. No spinal stenosis or significant foraminal narrowing. C6-7: Small central disc protrusion. No spinal stenosis or significant foraminal narrowing. C7-T1: Mild facet hypertrophy. No spinal stenosis or significant foraminal narrowing. MRI THORACIC SPINE FINDINGS Technical note: Despite efforts by the technologist and patient, mild motion artifact is present on today's exam and could not be eliminated. This reduces exam sensitivity and specificity. Alignment:  Physiologic. Vertebrae: As demonstrated on CT, there are acute minimally displaced fractures of the T4 and T5 vertebral bodies. The T4 fracture extends into the right facet joint and spinous process on CT, consistent with a Chance fracture. There is mild osseous retropulsion on the right by approximately 3 mm, best seen on the axial images. There is associated marrow and surrounding soft tissue edema. In addition, there is a nondisplaced fracture involving the posterosuperior corner of the T6 vertebral body, best seen on the sagittal images. No acute osseous findings within the lower thoracic spine. Cord: The thoracic cord appears normal in signal and caliber.The conus medullaris extends to the T12-L1 disc space level. Paraspinal and other soft tissues: Mild paraspinous edema/hemorrhage posteriorly adjacent to the T4 spinous process fracture. As above, subcutaneous hematoma superficial to the ligamentum nuchae extending inferiorly to the T2 level. Disc levels: No significant disc space findings in the upper thoracic spine. T4-5: Mild osseous retropulsion on the right with mild mass effect on the thoracic cord, best seen on the axial images. No significant foraminal narrowing, epidural hematoma or disc herniation. T5-6: No significant findings.  T6-7: Loss of disc height with a small partially calcified central disc protrusion. Mild mass effect on the ventral aspect of the cord. There is ample CSF posteriorly. T7-8: Mild disc  bulging with anterior osteophytes. No spinal stenosis or significant foraminal narrowing. T8-9: Mild disc bulging with a small calcified superiorly extruded disc fragment. No cord deformity or significant foraminal narrowing. T9-10: No spinal stenosis or significant foraminal narrowing. T10-11: Mild facet hypertrophy. No spinal stenosis or significant foraminal narrowing. T11-12: Mild facet hypertrophy. No spinal stenosis or significant foraminal narrowing. IMPRESSION: 1. Multiple acute fractures involving the posterior elements at C3, C5 and C7 as correlated with earlier CT. Associated paraspinal edema which could reflect associated ligamentous injury. No evidence of acute cervical vertebral fracture or traumatic subluxation. 2. Subtle cord T2 hyperintensity at C3-4, potentially reflecting mild cord contusion. No cervical cord compression, cord hemorrhage or epidural hematoma. 3. Acute fractures of T4, T5 and T6 vertebral bodies. T4 fracture extends into the posterior elements, consistent with a Chance fracture. Mild osseous retropulsion on the right at T4 with mild mass effect on the thoracic cord. No significant foraminal narrowing or epidural hematoma. 4. Subcutaneous hematoma superficial to the ligamentum nuchae extending from C6 to the T2 level. 5. Mild thoracic spondylosis as described. Electronically Signed   By: Elsie Perone M.D.   On: 05/23/2024 13:30   CT T-SPINE NO CHARGE Result Date: 05/23/2024 CLINICAL DATA:  Fall down stairs. Neck and back pain. Cervical spine fractures. EXAM: CT Thoracic Spine without contrast TECHNIQUE: Multiplanar CT images of the thoracic spine were reconstructed from contemporary CT of the Chest. RADIATION DOSE REDUCTION: This exam was performed according to the departmental dose-optimization program which includes automated exposure control, adjustment of the mA and/or kV according to patient size and/or use of iterative reconstruction technique. CONTRAST:  No additional  COMPARISON:  Same day cervical spine CT. Thoracic spine radiographs 02/07/2019. Chest CT 167183. FINDINGS: Alignment: Normal. Vertebrae: In addition to the separately reported cervical spine injuries, there are acute minimally displaced fractures of the T4 and T5 vertebral bodies. Minimal osseous retropulsion at T4. Fracture extends into the inferior right T4 articulating facet and spinous process. No significant loss of vertebral body height. No other fractures are identified. Paraspinal and other soft tissues: Mild anterior paraspinal soft tissue edema at T4 and T5. No focal hematoma. Additional chest details dictated separately. Disc levels: Mild multilevel thoracic spondylosis with disc bulging, small disc protrusions and endplate osteophytes. IMPRESSION: 1. Acute minimally displaced fractures of the T4 and T5 vertebral bodies with extension into the inferior right T4 articulating facet and spinous process. Minimal osseous retropulsion at T4. The T4 fracture is best categorized as a Chance fracture, potentially unstable. 2. No other acute osseous findings. 3. Mild thoracic spondylosis. 4. Additional chest details dictated separately. See separate cervical spine CT report. 5. These results were called by telephone at the time of interpretation on 05/23/2024 at 11:14 am to provider Prentice Medicus, who verbally acknowledged these results. Electronically Signed   By: Elsie Perone M.D.   On: 05/23/2024 11:14   CT CHEST ABDOMEN PELVIS W CONTRAST Result Date: 05/23/2024 EXAM: CT CHEST, ABDOMEN AND PELVIS WITH CONTRAST 05/23/2024 10:13:43 AM TECHNIQUE: CT of the chest, abdomen and pelvis was performed with the administration of intravenous contrast. Multiplanar reformatted images are provided for review. Automated exposure control, iterative reconstruction, and/or weight based adjustment of the mA/kV was utilized to reduce the radiation dose to as low as reasonably achievable. COMPARISON: CT of the chest  dated  12/03/2014. CLINICAL HISTORY: Trauma. Triage notes; Patient arrives via PT[facility] for fall down 4-5 steps. Patient with lac to middle of head, right knee abrasion, and abrasion to right forearm. Patient endorses missing a step coming steps at her home. Patient arrives in c-collar placed by fire. Bleeding controlled at time of arrival. FINDINGS: CHEST: MEDIASTINUM AND LYMPH NODES: Heart and pericardium are unremarkable. The central airways are clear. No mediastinal, hilar or axillary lymphadenopathy. LUNGS AND PLEURA: No focal consolidation or pulmonary edema. No pleural effusion or pneumothorax. ABDOMEN AND PELVIS: LIVER: The liver is unremarkable. GALLBLADDER AND BILE DUCTS: Gallbladder is unremarkable. No biliary ductal dilatation. SPLEEN: No acute abnormality. PANCREAS: No acute abnormality. ADRENAL GLANDS: No acute abnormality. KIDNEYS, URETERS AND BLADDER: No stones in the kidneys or ureters. No hydronephrosis. No perinephric or periureteral stranding. Urinary bladder is unremarkable. GI AND BOWEL: Stomach demonstrates no acute abnormality. There is no bowel obstruction. REPRODUCTIVE ORGANS: No acute abnormality. PERITONEUM AND RETROPERITONEUM: No ascites. No free air. VASCULATURE: Aorta is normal in caliber. ABDOMINAL AND PELVIS LYMPH NODES: No lymphadenopathy. REPRODUCTIVE ORGANS: No acute abnormality. BONES AND SOFT TISSUES: Chronic bilateral spondylolysis and slight anterolisthesis present at L5-S1. Healed fracture deformities of the left posterior rib cage. No acute fractures evident. IMPRESSION: 1. No acute abnormality of the chest, abdomen, and pelvis related to the provided clinical history of trauma. Electronically signed by: Evalene Coho MD 05/23/2024 10:36 AM EDT RP Workstation: [id number]   DG Chest Portable 1 View Result Date: 05/23/2024 EXAM: 1 VIEW XRAY OF THE CHEST 05/23/2024 10:15:00 AM COMPARISON: Radiograph of the chest dated 09/10/2019. CLINICAL HISTORY: Trauma. Level 2 trauma- Fall.  FINDINGS: LUNGS AND PLEURA: Low lung volumes. No focal pulmonary opacity. No pulmonary edema. No pleural effusion. No pneumothorax. HEART AND MEDIASTINUM: No acute abnormality of the cardiac and mediastinal silhouettes. BONES AND SOFT TISSUES: Degenerative changes of right shoulder. No acute osseous abnormality. IMPRESSION: 1. No acute process. 2. Low lung volumes. Electronically signed by: Evalene Coho MD 05/23/2024 10:31 AM EDT RP Workstation: [id number]   CT Cervical Spine Wo Contrast Result Date: 05/23/2024 CLINICAL DATA:  Fall down multiple steps. Head laceration and neck pain. EXAM: CT CERVICAL SPINE WITHOUT CONTRAST TECHNIQUE: Multidetector CT imaging of the cervical spine was performed without intravenous contrast. Multiplanar CT image reconstructions were also generated. RADIATION DOSE REDUCTION: This exam was performed according to the departmental dose-optimization program which includes automated exposure control, adjustment of the mA and/or kV according to patient size and/or use of iterative reconstruction technique. COMPARISON:  CT cervical spine 09/08/2019. MRI cervical spine 04/09/2020. FINDINGS: Alignment: Normal. Skull base and vertebrae: There are multiple acute cervical spine fractures. There are nondisplaced fractures involving the base of the C3 spinous process and left lamina. There is partial interbody and interfacetal ankylosis at C2-3. There is a comminuted and mildly displaced fracture of the C5 spinous process and right lamina. There is a mildly displaced fracture of the C7 spinous process. No definite involvement of the facet joints or traumatic subluxation. No evidence of vertebral body fracture. Soft tissues and spinal canal: No prevertebral fluid or swelling. No visible canal hematoma. There is posterior paraspinal soft tissue swelling adjacent to the fractures of the posterior elements. Disc levels: The disc heights are relatively maintained. There are paraspinal osteophytes  which are most prominent anteriorly at C4-5 and C5-6. As above, partial interbody and interfacetal ankylosis at C2-3. No evidence of large disc herniation or high-grade foraminal narrowing. Upper chest: Chest findings dictated separately. Other: Carotid atherosclerosis.  IMPRESSION: 1. Multiple acute cervical spine fractures involving the C3, C5 and C7 posterior elements as described. No evidence of traumatic subluxation. 2. No evidence of vertebral body fracture. 3. No evidence of large disc herniation or high-grade foraminal narrowing. 4. Critical Value/emergent results were called by telephone at the time of interpretation on 05/23/2024 at 10:30 am to provider ANDREW TEE , who verbally acknowledged these results. Electronically Signed   By: Elsie Perone M.D.   On: 05/23/2024 10:31   CT Head Wo Contrast Result Date: 05/23/2024 EXAM: CT HEAD WITHOUT CONTRAST 05/23/2024 10:13:43 AM TECHNIQUE: CT of the head was performed without the administration of intravenous contrast. Automated exposure control, iterative reconstruction, and/or weight based adjustment of the mA/kV was utilized to reduce the radiation dose to as low as reasonably achievable. COMPARISON: 09/08/2019 CLINICAL HISTORY: Trauma. Patient arrives via PTAR for fall down 4-5 steps. Patient with lac to middle of head, right knee abrasion, and abrasion to right forearm. Patient endorses missing a step coming steps at her home. Patient arrives in c-collar placed by fire. Bleeding controlled at time of arrival. Patient having trouble moving fingers and toes, endorses head and neck pain. CBG 188. BP 102/60, HR 104, 98 on room air. FINDINGS: BRAIN AND VENTRICLES: No acute hemorrhage. No evidence of acute infarct. No hydrocephalus. No extra-axial collection. No mass effect or midline shift. Moderate generalized cerebral volume loss present, which is advanced for the patient's age. Mild periventricular white matter disease. ORBITS: No acute abnormality.  SINUSES: No acute abnormality. SOFT TISSUES AND SKULL: Right forehead laceration and soft tissue swelling. No skull fracture. IMPRESSION: 1. No acute intracranial abnormality. 2. Moderate generalized cerebral volume loss, advanced for the patient's age. 3. Mild periventricular white matter disease. 4. Right forehead laceration and soft tissue swelling. 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 Lanis. CTO brace. No surgery planned acutely. Mobilize with PT/OT. Multimodal pain control T4/T5 fractures - Per NSGY. As above.   Forehead laceration - ENT consulted, closed 9/17 by Dr. Luciano. Dressing to remain intact 5-7 days. Postop office visit in 7-10 days; will require staple removal from scalp  Recent left foot/ankle fracture - CAM boot  T2DM - SSI Schizophrenia and intellectual delay - Continue home meds Hyponatremia - resolved CV/BP - Has been running soft. Hold home BP meds. Continue IVF. Monitor I/O. Repeat CBC pending - trend. Maximize non-narcotics (previously required narcan ) - She is on lower end of scale of prn pain medication here (RN to notify if pain is not controlled). Could be a neurogenic component as well given C-spine fx's with central cord syndrome.  FEN: CM diet. IVF at 125ml/hr VTE: SCD to RLE, will start LMWH when cleared by NS - will touch base with their team ID: No current abx, caregiver reported to our team previously Tdap was UTD Foley: None, spont void Dispo: Therapies. CIR vs SNF  I reviewed nursing notes, Consultant (ENT, NSGY) notes, last 24 h vitals and pain scores, last 48 h intake and output, last 24 h labs and trends, and last 24 h imaging results.     LOS: 2 days    Ozell CHRISTELLA Shaper, [facility]  Surgery 05/25/2024, 9:48 AM Please see Amion for pager number during day hours 7:00am-4:30pm

## 2024-05-25 NOTE — TOC Initial Note (Signed)
 Transition of Care [facility]) - Initial/Assessment Note    Patient Details  Name: Alexandria Harvey MRN: [id number] Date of Birth: 1981-05-26  Transition of Care [facility]) CM/SW Contact:    Lelend Heinecke E Risha Barretta, LCSW Phone Number: 05/25/2024, 9:35 AM  Clinical Narrative:                 Patient was admitted post fall. PT/OT recommend CIR. Per chart review, CIR likely cannot accept patient due to care needs post-rehab. CSW called patient's Legal Guardian - DSS Social Worker Nat Duos.  Nat states patient resides at [facility]. Nat states she would be agreeable to SNF for STR, she prefers somewhere local. Nat provided updated contact # and asked CSW to update patient's chart.  CSW started SNF work up.   Expected Discharge Plan: Skilled Nursing Facility Barriers to Discharge: Continued Medical Work up   Patient Goals and CMS Choice   CMS Medicare.gov Compare Post Acute Care list provided to:: Legal Guardian Choice offered to / list presented to : [facility] POA / Guardian      Expected Discharge Plan and Services       Living arrangements for the past 2 months: Group Home                                      Prior Living Arrangements/Services Living arrangements for the past 2 months: Group Home Lives with:: Facility Resident Patient language and need for interpreter reviewed:: Yes Do you feel safe going back to the place where you live?: Yes      Need for Family Participation in Patient Care: Yes (Comment) Care giver support system in place?: Yes (comment)   Criminal Activity/Legal Involvement Pertinent to Current Situation/Hospitalization: No - Comment as needed  Activities of Daily Living      Permission Sought/Granted Permission sought to share information with : Oceanographer granted to share information with : Yes, Verbal Permission Granted (by legal guardian)     Permission granted to share info w AGENCY: SNFs, AIR,  for DC planning        Emotional Assessment         Alcohol / Substance Use: Not Applicable Psych Involvement: No (comment)  Admission diagnosis:  Trauma [T14.90XA] Central cord syndrome (HCC) [D85.870J] Fracture of cervical spinous process, initial encounter (HCC) [S12.9XXA] Contusion of cervical cord, initial encounter (HCC) [S14.109A] Other closed fracture of thoracic vertebra, unspecified thoracic vertebral level, initial encounter (HCC) [S22.008A] Central cord synd at unsp level of cerv spinal cord, init (HCC) [D85.870J] Patient Active Problem List   Diagnosis Date Noted   Central cord syndrome (HCC) 05/23/2024   Pressure injury of skin 09/10/2019   Acute respiratory failure with hypoxia (HCC) 09/10/2019   COVID-19 virus infection 09/09/2019   Elevated AST (SGOT) 09/09/2019   Thrombocytopenia (HCC) 09/09/2019   Positive D dimer 09/09/2019   Encephalopathy acute 09/09/2019   Sepsis due to pneumonia (HCC) 09/08/2019   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 [place] [address] Phone: [phone] Fax: [phone]  [facility] - [place], [place] - [address] [place] [address] Phone: [phone] Fax: [phone]  [facility] - [place], [place] - [address] [place] [address] Phone: [phone] Fax: [phone]     Social  Drivers of Health (SDOH) Social History: SDOH Screenings   Food Insecurity: Patient Unable To Answer (05/24/2024)  Housing: Patient Unable To Answer (05/24/2024)  Transportation Needs: Patient Unable To Answer (05/24/2024)  Utilities: Patient Unable To Answer (05/24/2024)  Financial Resource Strain: High Risk (02/29/2024)   Received from [facility]  Physical Activity: Unknown (02/29/2024)   Received from [facility]  Social Connections: Moderately Integrated (02/29/2024)   Received from [facility]  Stress: No Stress Concern Present (02/29/2024)   Received from [facility]  Tobacco Use: Low Risk  (05/12/2024)   SDOH Interventions:     Readmission Risk Interventions     No data to display

## 2024-05-25 NOTE — Progress Notes (Signed)
  Inpatient Rehabilitation Admissions Coordinator   Patient continues to not be at a level to tolerate CIR level rehab. Recommend other rehab venue options. We will follow at a distance. What caregiver supports would she have at group home vs need for LTC SNF?  Heron Leavell, RN, MSN Rehab Admissions Coordinator [phone] 05/25/2024 3:36 PM

## 2024-05-25 NOTE — Progress Notes (Signed)
 Physical Therapy Treatment Patient Details Name: Alexandria Harvey MRN: [id number] DOB: 07-13-1981 Today's Date: 05/25/2024   History of Present Illness 43 year old female presented 05/23/24 S/P fall with forehead laceration and C3, C5, C7 FXs with central cord type SCI. T4-5 fxs Patient has known left foot/ankle fracture (per xray 05/12/24 fracture of dorsal aspect of midfoot and  base of 2nd metatarsal) in a CAM boot and lost her footing while ambulating. PMH significant for intellectual disability, schizophrenia, dropped head syndrome, HTN, seizures, and diabetes.    PT Comments  Patient calmer today and with less reports of pain during PROM/AAROM of extremities. Noted improved active movement of bil LEs compared to 9/17, however remains very weak with LLE weaker than RLE. Obtained soft-touch call light and attempted pt use under each hand with pt unable to press down hard enough to trigger. Attempted under Lt elbow with pt able to trigger, however also repeatedly triggered spontaneously without pt intent. RN made aware and will also try to trouble-shoot how she can utilize call light. Requested order for PRAFOs to maintain ankle DF bil and order for air mattress to prevent skin breakdown.     If plan is discharge home, recommend the following: Two people to help with walking and/or transfers;Assist for transportation;Help with stairs or ramp for entrance;Supervision due to cognitive status   Can travel by private vehicle        Equipment Recommendations  Wheelchair (measurements PT);Wheelchair cushion (measurements PT);Hoyer lift;Hospital bed    Recommendations for Other Services       Precautions / Restrictions Precautions Precautions: Cervical Precaution Booklet Issued: No Recall of Precautions/Restrictions: Impaired Required Braces or Orthoses: Other Brace (CTO) Cervical Brace: Hard collar;At all times Other Brace: CAM boot L leg Restrictions Weight Bearing Restrictions Per  Provider Order: No     Mobility  Bed Mobility Overal bed mobility: Needs Assistance             General bed mobility comments: Bed elevated into chair-like position due to pt's anxiety and lack of +2 assist. Pt tolerated much better than 9/17    Transfers                        Ambulation/Gait                   Stairs             Wheelchair Mobility     Tilt Bed    Modified Rankin (Stroke Patients Only)       Balance                                            Communication Communication Communication: Impaired Factors Affecting Communication: Reduced clarity of speech  Cognition Arousal: Alert Behavior During Therapy: Anxious   PT - Cognitive impairments: History of cognitive impairments                       PT - Cognition Comments: no family present Following commands: Impaired Following commands impaired: Follows one step commands inconsistently, Follows one step commands with increased time    Cueing Cueing Techniques: Verbal cues, Gestural cues, Tactile cues, Visual cues  Exercises General Exercises - Lower Extremity Ankle Circles/Pumps: PROM (heel cord stretching bil) Quad Sets: AROM, Both, 5 reps Heel Slides: AAROM, Both, 5 reps  Other Exercises Other Exercises: pt with bil toe wiggling rt better than lt Other Exercises: PROM bil hip IR stretch x 30 sec hold x 2    General Comments        Pertinent Vitals/Pain Pain Assessment Pain Assessment: Faces Faces Pain Scale: Hurts little more Pain Location: RUE and bil feet Pain Descriptors / Indicators: Discomfort, Moaning Pain Intervention(s): Limited activity within patient's tolerance, Monitored during session, Premedicated before session, Repositioned    Home Living                          Prior Function            PT Goals (current goals can now be found in the care plan section) Acute Rehab PT Goals Patient Stated Goal:  decrease pain Time For Goal Achievement: 06/07/24 Potential to Achieve Goals: Good Progress towards PT goals: Progressing toward goals    Frequency    Min 3X/week      PT Plan      Co-evaluation              AM-PAC PT 6 Clicks Mobility   Outcome Measure  Help needed turning from your back to your side while in a flat bed without using bedrails?: Total Help needed moving from lying on your back to sitting on the side of a flat bed without using bedrails?: Total Help needed moving to and from a bed to a chair (including a wheelchair)?: Total Help needed standing up from a chair using your arms (e.g., wheelchair or bedside chair)?: Total Help needed to walk in hospital room?: Total Help needed climbing 3-5 steps with a railing? : Total 6 Click Score: 6    End of Session Equipment Utilized During Treatment: Cervical collar;Oxygen Activity Tolerance: Patient tolerated treatment well Patient left: in bed;with call bell/phone within reach;with bed alarm set;Other (comment) (obtained and attempted use of soft-touch call bell without success (could not trigger with either hand, triggered spontaneously behind Lt elbow)) Nurse Communication: Mobility status;Need for lift equipment;Other (comment) (need for air mattress; continue to work on use of soft-touch) PT Visit Diagnosis: History of falling (Z91.81);Muscle weakness (generalized) (M62.81);Difficulty in walking, not elsewhere classified (R26.2);Other symptoms and signs involving the nervous system (R29.898);Pain Pain - part of body:  (all over)     Time: 8957-8940 PT Time Calculation (min) (ACUTE ONLY): 17 min  Charges:    $Therapeutic Exercise: 8-22 mins PT General Charges $$ ACUTE PT VISIT: 1 Visit                      Macario RAMAN, PT Acute Rehabilitation Services  Office [phone]    Macario SHAUNNA Soja 05/25/2024, 11:43 AM

## 2024-05-25 NOTE — Progress Notes (Signed)
 Pt transferred onto a rotating air mattress with no complications.

## 2024-05-26 LAB — CBC
HCT: 30.7 % — ABNORMAL LOW (ref 36.0–46.0)
Hemoglobin: 9.8 g/dL — ABNORMAL LOW (ref 12.0–15.0)
MCH: 27.8 pg (ref 26.0–34.0)
MCHC: 31.9 g/dL (ref 30.0–36.0)
MCV: 87.2 fL (ref 80.0–100.0)
Platelets: 180 K/uL (ref 150–400)
RBC: 3.52 MIL/uL — ABNORMAL LOW (ref 3.87–5.11)
RDW: 17 % — ABNORMAL HIGH (ref 11.5–15.5)
WBC: 10.2 K/uL (ref 4.0–10.5)
nRBC: 0.2 % (ref 0.0–0.2)

## 2024-05-26 LAB — GLUCOSE, CAPILLARY
Glucose-Capillary: 142 mg/dL — ABNORMAL HIGH (ref 70–99)
Glucose-Capillary: 138 mg/dL — ABNORMAL HIGH (ref 70–99)
Glucose-Capillary: 152 mg/dL — ABNORMAL HIGH (ref 70–99)
Glucose-Capillary: 153 mg/dL — ABNORMAL HIGH (ref 70–99)

## 2024-05-26 MED ORDER — METHOCARBAMOL 1000 MG/10ML IJ SOLN
1000.0000 mg | Freq: Three times a day (TID) | INTRAMUSCULAR | Status: AC
Start: 2024-05-26 — End: 2024-05-29

## 2024-05-26 MED ORDER — ENOXAPARIN SODIUM 30 MG/0.3ML IJ SOSY
30.0000 mg | PREFILLED_SYRINGE | Freq: Two times a day (BID) | INTRAMUSCULAR | Status: DC
  Administered 2024-05-26 – 2024-05-29 (×7): 30 mg via SUBCUTANEOUS
  Filled 2024-05-26 (×7): qty 0.3

## 2024-05-26 MED ORDER — MENTHOL 3 MG MT LOZG
1.0000 | LOZENGE | OROMUCOSAL | Status: DC | PRN
  Filled 2024-05-26: qty 9

## 2024-05-26 MED ORDER — METHOCARBAMOL 500 MG PO TABS
1000.0000 mg | ORAL_TABLET | Freq: Three times a day (TID) | ORAL | Status: AC
  Administered 2024-05-26 – 2024-05-29 (×8): 1000 mg via ORAL
  Filled 2024-05-26 (×8): qty 2

## 2024-05-26 MED ORDER — GUAIFENESIN-DM 100-10 MG/5ML PO SYRP
5.0000 mL | ORAL_SOLUTION | ORAL | Status: DC | PRN
  Administered 2024-05-26 – 2024-05-28 (×4): 5 mL via ORAL
  Filled 2024-05-26 (×4): qty 5

## 2024-05-26 NOTE — Progress Notes (Signed)
  NEUROSURGERY PROGRESS NOTE   No issues overnight. Cont to c/o neck/back pain  EXAM:  BP (!) 103/57 (BP Location: Right Arm)   Pulse 91   Temp 98.6 F (37 C) (Oral)   Resp 19   Ht 5' 2.5 (1.588 m)   Wt 69.9 kg   SpO2 92%   BMI 27.74 kg/m   Awake, alert Speech fluent CN grossly intact  Dense quadriparesis with minimal UE movement, can wiggle toes, flicker movement of knee.  IMPRESSION:  43 y.o. female s/p fall with severe cervical central cord syndrome and C3-4 and T4 fractures which I think can be treated conservatively for now. No ongoing spinal cord compression to necessitate operative decompression.  PLAN: - Cont PT/OT - CTO brace  - ok for DVT prophylaxis - Will need outpatient f/u in 3-4 weeks.   Gerldine Maizes, MD [facility]

## 2024-05-26 NOTE — Progress Notes (Addendum)
 Subjective: CC: Stable areas of pain. Pain is well controlled with current regimen. No PRN pain medication yesterday. Tolerating diet with assistance of staff to feed her without n/v or abdominal pain. Passing flatus. BM yesterday. Voidng with good uop.   Afebrile. Tachycardia resolved. HR 90's. BP improved. SBP 120's while I was in the room. Hgb stable. CBG's improved.   Objective: Vital signs in last 24 hours: Temp:  [98.3 F (36.8 C)-99.3 F (37.4 C)] 98.6 F (37 C) (09/19 0300) Pulse Rate:  [91-109] 91 (09/19 0300) Resp:  [15-22] 19 (09/19 0300) BP: (103-122)/(57-78) 103/57 (09/19 0300) SpO2:  [92 %-98 %] 92 % (09/19 0300) Last BM Date :  (PTA)  Intake/Output from previous day: 09/18 0701 - 09/19 0700 In: 960 [P.O.:960] Out: 1600 [Urine:1600] Intake/Output this shift: No intake/output data recorded.  PE: General: Pleasant female who is laying in bed in NAD. HEENT: Forehead laceration covered with dressing. Dressing clean Neck: CTO in place Heart: Reg, DP 2+ b/l Lungs: CTA b/l. Normal rate and effort.  Abd: Soft, NT, ND. MS: No LE edema Skin: Warm and dry  Neuro: Stable neuro exam of BUE and BLE from prior  Psych: A&Ox4  Lab Results:  Recent Labs    05/25/24 1039 05/26/24 0527  WBC 9.6 10.2  HGB 9.6* 9.8*  HCT 30.1* 30.7*  PLT 162 180   BMET Recent Labs    05/24/24 0449 05/25/24 0714  NA 132* 136  K 4.2 4.2  CL 102 105  CO2 19* 20*  GLUCOSE 139* 135*  BUN 23* 9  CREATININE 0.69 0.53  CALCIUM  7.9* 7.7*   PT/INR Recent Labs    05/23/24 0929  LABPROT 14.1  INR 1.0   CMP     Component Value Date/Time   NA 136 05/25/2024 0714   NA 140 06/15/2019 1307   K 4.2 05/25/2024 0714   CL 105 05/25/2024 0714   CO2 20 (L) 05/25/2024 0714   GLUCOSE 135 (H) 05/25/2024 0714   BUN 9 05/25/2024 0714   BUN 15 06/15/2019 1307   CREATININE 0.53 05/25/2024 0714   CALCIUM  7.7 (L) 05/25/2024 0714   PROT 5.2 (L) 05/23/2024 1200   PROT 6.5 06/15/2019  1307   ALBUMIN 2.5 (L) 05/23/2024 1200   ALBUMIN 3.5 (L) 06/15/2019 1307   AST 26 05/23/2024 1200   ALT 16 05/23/2024 1200   ALKPHOS 40 05/23/2024 1200   BILITOT 0.4 05/23/2024 1200   BILITOT 0.3 06/15/2019 1307   GFRNONAA >60 05/25/2024 0714   GFRAA >60 09/14/2019 0320   Lipase     Component Value Date/Time   LIPASE 28 10/13/2014 1426    Studies/Results: No results found.   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 Lanis. CTO brace. No surgery planned acutely. Mobilize with PT/OT. Multimodal pain control T4/T5 fractures - Per NSGY. As above.   Forehead laceration - ENT consulted, closed 9/17 by Dr. Luciano. Dressing to remain intact 5-7 days. Postop office visit in 7-10 days; will require staple removal from scalp  Recent left foot/ankle fracture - CAM boot  T2DM - SSI. CBG's improved. Monitor.  Schizophrenia and intellectual delay - Continue home meds Hyponatremia - resolved CV/BP - BP previously running soft. Home BP meds held and placed on IVF. Good UOP. Hgb stable. Maximize non-narcotics (previously required narcan ) - She is  on lower end of scale of prn pain medication here (RN to notify if pain is not controlled). Could be a neurogenic component as well given C-spine fx's with central cord syndrome. Improved.  FEN: CM diet. Decrease IVF at 29ml/hr VTE: SCD to RLE, discussed with NSGY 9/18 - okay to start Lovenox .  ID: No current abx, caregiver reported to our team previously Tdap was UTD Foley: None, spont void Dispo: Asked RN to set up breath activated call light. Therapies. CIR vs SNF pending support and progress with therapies. Discussed at multidisciplinary rounds on 9/18. Medically stable for d/c.   I reviewed nursing notes, Consultant (ENT, NSGY) notes, last 24 h vitals and pain scores, last 48 h intake and output, last 24 h  labs and trends, and last 24 h imaging results.     LOS: 3 days    Ozell CHRISTELLA Shaper, [facility] 05/26/2024, 8:45 AM Please see Amion for pager number during day hours 7:00am-4:30pm

## 2024-05-26 NOTE — TOC Progression Note (Signed)
 Transition of Care [facility]) - Progression Note    Patient Details  Name: Alexandria Harvey MRN: [id number] Date of Birth: [birth year]  [facility]) CM/SW Contact  Carmelita FORBES Carbon, LCSW Phone Number: 05/26/2024, 3:01 PM  Clinical Narrative:    DONALD [id number] E expires 10/19. Jon with [facility] confirms they will have a bed for patient at [facility] on Sunday if medically ready. Nat, Legal Guardian with [facility] DSS, confirms plan for patient to DC to [facility] on Sunday if medically ready - states ICM does not need to contact DSS over weekend since she is already aware of DC.    Expected Discharge Plan: Skilled Nursing Facility Barriers to Discharge: Continued Medical Work up               Expected Discharge Plan and Services       Living arrangements for the past 2 months: Group Home                                       Social Drivers of Health (SDOH) Interventions SDOH Screenings   Food Insecurity: Patient Unable To Answer (05/24/2024)  Housing: Patient Unable To Answer (05/24/2024)  Transportation Needs: Patient Unable To Answer (05/24/2024)  Utilities: Patient Unable To Answer (05/24/2024)  Financial Resource Strain: High Risk (02/29/2024)   Received from [facility]  Physical Activity: Unknown (02/29/2024)   Received from [facility]  Social Connections: Moderately Integrated (02/29/2024)   Received from [facility]  Stress: No Stress Concern Present (02/29/2024)   Received from [facility]  Tobacco Use: Low Risk  (05/12/2024)    Readmission Risk Interventions     No data to display

## 2024-05-26 NOTE — Care Management Important Message (Signed)
 Important Message  Patient Details  Name: Alexandria Harvey MRN: [id number] Date of Birth: [birth year]   Important Message Given:  Yes - Medicare IM     Jon Cruel 05/26/2024, 3:38 PM

## 2024-05-26 NOTE — TOC PASRR Note (Signed)
 RE: Alexandria Harvey Date of Birth:  1980-12-18 Date:05/26/24   To Whom It May Concern:  Please be advised that the above-named patient will require a short-term nursing home stay - anticipated 30 days or less for rehabilitation and strengthening.  The plan is for return home.

## 2024-05-26 NOTE — Progress Notes (Addendum)
 Occupational Therapy Treatment Patient Details Name: Alexandria Harvey MRN: [id number] DOB: 10/28/1980 Today's Date: 05/26/2024   History of present illness 43 year old female presented 05/23/24 S/P fall with forehead laceration and C3, C5, C7 FXs with central cord type SCI. T4-5 fxs Patient has known left foot/ankle fracture (per xray 05/12/24 fracture of dorsal aspect of midfoot and  base of 2nd metatarsal) in a CAM boot and lost her footing while ambulating. PMH significant for intellectual disability, schizophrenia, dropped head syndrome, HTN, seizures, and diabetes.   OT comments  Pt. Seen for skilled OT treatment session.  RN/CNA present and able to assisted them with rolling L/R TOTAL A +2 for peri care.  Pt. Tolerated well with no anxiety noted.  SATS wnl during bed mobilty.  Gentle ROM BUEs.  Pt. Tolerated well.  No active movement noted when asked to squeeze with either hand.  Pt. C/o pain due R wrist area at IV site location.  Assisted with positioning and elevation on pillows for comfort.  HOB elevated to chair position to promote upright sitting.  Cont. With acute OT POC.        If plan is discharge home, recommend the following:  Two people to help with walking and/or transfers;Two people to help with bathing/dressing/bathroom;Assistance with cooking/housework;Assistance with feeding;Direct supervision/assist for medications management;Assist for transportation;Help with stairs or ramp for entrance;Supervision due to cognitive status;Direct supervision/assist for financial management   Equipment Recommendations       Recommendations for Other Services Rehab consult    Precautions / Restrictions Precautions Precautions: Cervical Recall of Precautions/Restrictions: Impaired Required Braces or Orthoses: Other Brace (CTO) Cervical Brace: Hard collar;At all times Other Brace: CAM boot L leg       Mobility Bed Mobility Overal bed mobility: Needs Assistance Bed Mobility:  Rolling Rolling: Total assist, +2 for physical assistance         General bed mobility comments: rolling L/R with rn and cna for peri care total a +2    Transfers                         Balance                                           ADL either performed or assessed with clinical judgement   ADL                                              Extremity/Trunk Assessment Upper Extremity Assessment RUE Deficits / Details: could not squeeze hand or activate soft touch call bell LUE Deficits / Details: could not squeeze hand or activate sof touch call bell            Vision       Perception     Praxis     Communication     Cognition Arousal: Alert Behavior During Therapy: WFL for tasks assessed/performed Cognition: History of cognitive impairments, No family/caregiver present to determine baseline, Cognition impaired                                        Cueing  Exercises General Exercises - Upper Extremity Elbow Flexion: PROM, Both, 5 reps, Supine (hob elevated to seated/chair position) Elbow Extension: PROM, Both, 5 reps, Supine (hob elevated to seated/chair position) Wrist Flexion: PROM, 5 reps, Both (hob elevated to seated/chair position) Wrist Extension: PROM, 5 reps, Both (hob elevated to seated/chair position) Digit Composite Flexion: PROM, Both, 5 reps, Supine (hob elevated to seated/chair position) Composite Extension: PROM, Both, 5 reps, Supine (hob elevated to seated/chair position) Other Exercises Other Exercises: PROM BUEs    Shoulder Instructions       General Comments      Pertinent Vitals/ Pain       Pain Assessment Pain Assessment: Faces Faces Pain Scale: Hurts little more Pain Location: R wrist at IV site Pain Descriptors / Indicators: Discomfort, Moaning Pain Intervention(s): Repositioned  Home Living                                           Prior Functioning/Environment              Frequency  Min 2X/week        Progress Toward Goals  OT Goals(current goals can now be found in the care plan section)  Progress towards OT goals: Progressing toward goals     Plan      Co-evaluation                 AM-PAC OT 6 Clicks Daily Activity     Outcome Measure   Help from another person eating meals?: Total Help from another person taking care of personal grooming?: Total Help from another person toileting, which includes using toliet, bedpan, or urinal?: Total Help from another person bathing (including washing, rinsing, drying)?: Total Help from another person to put on and taking off regular upper body clothing?: Total Help from another person to put on and taking off regular lower body clothing?: Total 6 Click Score: 6    End of Session Equipment Utilized During Treatment: Cervical collar;Other (comment)  OT Visit Diagnosis: Unsteadiness on feet (R26.81);Other abnormalities of gait and mobility (R26.89);Muscle weakness (generalized) (M62.81);History of falling (Z91.81);Other symptoms and signs involving cognitive function;Feeding difficulties (R63.3);Pain   Activity Tolerance Patient tolerated treatment well   Patient Left in bed;with call bell/phone within reach;Other (comment) (pt. unable to activate call bell rn/cna aware and are doing frequent checks on pt. and looking into adaptive call bell that uses pts mouth to activate)   Nurse Communication  Rn made aware of pt. C/o pain at IV site R wrist area        Time: 1131-1151 OT Time Calculation (min): 20 min  Charges: OT General Charges $OT Visit: 1 Visit OT Treatments $Therapeutic Activity: 8-22 mins  Randall, COTA/L Acute Rehabilitation [phone]   CHRISTELLA Nest Lorraine-COTA/L  05/26/2024, 12:04 PM

## 2024-05-26 NOTE — TOC Progression Note (Addendum)
 Transition of Care [facility]) - Progression Note    Patient Details  Name: BEILA PURDIE MRN: [id number] Date of Birth: August 31, 1981  Transition of Care [facility]) CM/SW Contact  Mehek Grega E Deah Ottaway, LCSW Phone Number: 05/26/2024, 9:14 AM  Clinical Narrative:    Call to DSS Sherlynn Garre to present bed offers for SNF, left a VM requesting a return call.   Update 2:15- Call to DSS Sherlynn Garre, presented bed offers. She chose [facility]. Spoke to [facility] in Admissions, she states they will have a bed for patient on Sunday if medically ready and SNF [facility] *[facility] IS CURRENTLY PENDING*.  DSS Guardian Garre agreeable to DC to [facility] when [place] is back.   Expected Discharge Plan: Skilled Nursing Facility Barriers to Discharge: Continued Medical Work up               Expected Discharge Plan and Services       Living arrangements for the past 2 months: Group Home                                       Social Drivers of Health (SDOH) Interventions SDOH Screenings   Food Insecurity: Patient Unable To Answer (05/24/2024)  Housing: Patient Unable To Answer (05/24/2024)  Transportation Needs: Patient Unable To Answer (05/24/2024)  Utilities: Patient Unable To Answer (05/24/2024)  Financial Resource Strain: High Risk (02/29/2024)   Received from [facility]  Physical Activity: Unknown (02/29/2024)   Received from [facility]  Social Connections: Moderately Integrated (02/29/2024)   Received from [facility]  Stress: No Stress Concern Present (02/29/2024)   Received from [facility]  Tobacco Use: Low Risk  (05/12/2024)    Readmission Risk Interventions     No data to display

## 2024-05-26 NOTE — Progress Notes (Signed)
 Patient desating at 83% sustained. This RN applied 2L [place], now sating at 91-93%.

## 2024-05-27 LAB — GLUCOSE, CAPILLARY
Glucose-Capillary: 136 mg/dL — ABNORMAL HIGH (ref 70–99)
Glucose-Capillary: 199 mg/dL — ABNORMAL HIGH (ref 70–99)
Glucose-Capillary: 148 mg/dL — ABNORMAL HIGH (ref 70–99)
Glucose-Capillary: 155 mg/dL — ABNORMAL HIGH (ref 70–99)
Glucose-Capillary: 148 mg/dL — ABNORMAL HIGH (ref 70–99)

## 2024-05-27 LAB — CBC
HCT: 26.7 % — ABNORMAL LOW (ref 36.0–46.0)
Hemoglobin: 8.8 g/dL — ABNORMAL LOW (ref 12.0–15.0)
MCH: 28.6 pg (ref 26.0–34.0)
MCHC: 33 g/dL (ref 30.0–36.0)
MCV: 86.7 fL (ref 80.0–100.0)
Platelets: 211 K/uL (ref 150–400)
RBC: 3.08 MIL/uL — ABNORMAL LOW (ref 3.87–5.11)
RDW: 17.1 % — ABNORMAL HIGH (ref 11.5–15.5)
WBC: 8.8 K/uL (ref 4.0–10.5)
nRBC: 0.5 % — ABNORMAL HIGH (ref 0.0–0.2)

## 2024-05-27 NOTE — Plan of Care (Signed)

## 2024-05-27 NOTE — Progress Notes (Signed)
 Subjective: CC: AF and HDS, HR 90s. Hb 8.8 from 9.8  On exam, she is resting comfortably. NAD  Objective: Vital signs in last 24 hours: Temp:  [97.9 F (36.6 C)-98.9 F (37.2 C)] 97.9 F (36.6 C) (09/20 0318) Pulse Rate:  [87-104] 87 (09/20 0318) Resp:  [16-19] 16 (09/20 0318) BP: (117-129)/(66-85) 117/66 (09/20 0318) SpO2:  [93 %-96 %] 95 % (09/20 0318) Last BM Date :  (PTA)  Intake/Output from previous day: 09/19 0701 - 09/20 0700 In: 840 [P.O.:840] Out: 3000 [Urine:3000] Intake/Output this shift: Total I/O In: -  Out: 1100 [Urine:1100]  PE: General: Pleasant female who is laying in bed in NAD. HEENT: Forehead laceration covered with dressing. Dressing clean Neck: CTO in place Heart: Reg, DP 2+ b/l Lungs: CTA b/l. Normal rate and effort.  Abd: Soft, NT, ND. MS: No LE edema Skin: Warm and dry  Neuro: Stable neuro exam of BUE and BLE from prior  Psych: A&Ox4  Lab Results:  Recent Labs    05/26/24 0527 05/27/24 0724  WBC 10.2 8.8  HGB 9.8* 8.8*  HCT 30.7* 26.7*  PLT 180 211   BMET Recent Labs    05/25/24 0714  NA 136  K 4.2  CL 105  CO2 20*  GLUCOSE 135*  BUN 9  CREATININE 0.53  CALCIUM  7.7*   PT/INR No results for input(s): LABPROT, INR in the last 72 hours.  CMP     Component Value Date/Time   NA 136 05/25/2024 0714   NA 140 06/15/2019 1307   K 4.2 05/25/2024 0714   CL 105 05/25/2024 0714   CO2 20 (L) 05/25/2024 0714   GLUCOSE 135 (H) 05/25/2024 0714   BUN 9 05/25/2024 0714   BUN 15 06/15/2019 1307   CREATININE 0.53 05/25/2024 0714   CALCIUM  7.7 (L) 05/25/2024 0714   PROT 5.2 (L) 05/23/2024 1200   PROT 6.5 06/15/2019 1307   ALBUMIN 2.5 (L) 05/23/2024 1200   ALBUMIN 3.5 (L) 06/15/2019 1307   AST 26 05/23/2024 1200   ALT 16 05/23/2024 1200   ALKPHOS 40 05/23/2024 1200   BILITOT 0.4 05/23/2024 1200   BILITOT 0.3 06/15/2019 1307   GFRNONAA >60 05/25/2024 0714   GFRAA >60 09/14/2019 0320   Lipase     Component Value  Date/Time   LIPASE 28 10/13/2014 1426    Studies/Results: No results found.   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 Lanis. CTO brace. No surgery planned acutely. Mobilize with PT/OT. Multimodal pain control T4/T5 fractures - Per NSGY. As above.   Forehead laceration - ENT consulted, closed 9/17 by Dr. Luciano. Dressing to remain intact 5-7 days. Postop office visit in 7-10 days; will require staple removal from scalp  Recent left foot/ankle fracture - CAM boot  T2DM - SSI. CBG's improved. Monitor.  Schizophrenia and intellectual delay - Continue home meds Hyponatremia - resolved CV/BP - BP previously running soft. Home BP meds held and placed on IVF. Good UOP. Maximize non-narcotics (previously required narcan ) - She is on lower end of scale of prn pain medication here (RN to notify if pain is not controlled). Could be a neurogenic component as well given C-spine fx's with central cord syndrome. Improved. Hb 8.8 from 9.8, will repeat tomorrow FEN: CM diet. Decrease IVF at 78ml/hr VTE: SCD to RLE, discussed with NSGY  9/18 - okay to start Lovenox .  ID: No current abx, caregiver reported to our team previously Tdap was UTD Foley: None, spont void Dispo: Asked RN to set up breath activated call light. Therapies. CIR vs SNF pending support and progress with therapies. Discussed at multidisciplinary rounds on 9/18. Medically stable for d/c.   I reviewed nursing notes, Consultant (ENT, NSGY) notes, last 24 h vitals and pain scores, last 48 h intake and output, last 24 h labs and trends, and last 24 h imaging results.     LOS: 4 days    Alexandria DELENA Idler, MD [facility] Surgery 05/27/2024, 10:15 AM Please see Amion for pager number during day hours 7:00am-4:30pm

## 2024-05-28 LAB — GLUCOSE, CAPILLARY
Glucose-Capillary: 137 mg/dL — ABNORMAL HIGH (ref 70–99)
Glucose-Capillary: 191 mg/dL — ABNORMAL HIGH (ref 70–99)
Glucose-Capillary: 198 mg/dL — ABNORMAL HIGH (ref 70–99)
Glucose-Capillary: 180 mg/dL — ABNORMAL HIGH (ref 70–99)

## 2024-05-28 LAB — CBC
HCT: 27.6 % — ABNORMAL LOW (ref 36.0–46.0)
Hemoglobin: 9 g/dL — ABNORMAL LOW (ref 12.0–15.0)
MCH: 27.8 pg (ref 26.0–34.0)
MCHC: 32.6 g/dL (ref 30.0–36.0)
MCV: 85.2 fL (ref 80.0–100.0)
Platelets: 230 K/uL (ref 150–400)
RBC: 3.24 MIL/uL — ABNORMAL LOW (ref 3.87–5.11)
RDW: 16.9 % — ABNORMAL HIGH (ref 11.5–15.5)
WBC: 10.1 K/uL (ref 4.0–10.5)
nRBC: 1 % — ABNORMAL HIGH (ref 0.0–0.2)

## 2024-05-28 NOTE — Progress Notes (Signed)
 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 Lanis. CTO brace T4/T5 fractures  - Per NSGY   Forehead laceration  - ENT consulted, closed 9/17 by Dr. Luciano. Dressing to remain intact 5-7 days. Postop office visit in 7-10 days; will require staple removal from scalp  Recent left foot/ankle fracture  - CAM boot  T2DM  - SSI. CBG's improved. Monitor.  Schizophrenia and intellectual delay  - Continue home meds CV/BP  - BP previously running soft. Home BP meds held and placed on IVF. Good UOP. Maximize non-narcotics (previously required narcan ) - She is on lower end of scale of prn pain medication here (RN to notify if pain is not controlled). Could be a neurogenic component as well given C-spine fx's with central cord syndrome. Improved. - hemoglobin stable this AM at 9.0 mg/dl  FEN: CM diet. Decrease IVF at 21ml/hr VTE: SCD to RLE, discussed with NSGY 9/18 - okay to start Lovenox .  ID: No current abx, caregiver reported to our team previously Tdap was UTD Foley: None, spont void  Dispo: Therapies. CIR vs SNF pending support and progress with therapies. Discussed at multidisciplinary rounds on 9/18.         Krystal Spinner, MD [facility] Surgery A [facility] practice Office: [phone]        Chief Complaint: Fall  Subjective: Patient responsive, family at bedside.  No complaints.  Objective: Vital signs in last 24 hours: Temp:  [97.8 F (36.6 C)-98.9 F (37.2 C)] 97.8 F (36.6 C) (09/21 0811) Pulse Rate:  [85-100] 94 (09/21 0811) Resp:  [17-20] 17 (09/21 0811) BP: (131-140)/(83-88) 140/86 (09/21 0811) SpO2:  [94 %-97 %] 96 % (09/21 0811) Last BM Date : 05/25/24  Intake/Output from previous day: 09/20 0701 - 09/21 0700 In: 3513.6 [I.V.:3513.6] Out: 2000 [Urine:2000] Intake/Output this shift: Total I/O In: -  Out: 900 [Urine:900]  Physical Exam: HEENT - sclerae clear, mucous membranes moist;  dressing on forehead dry and intact Neck - hard collar in place Abdomen - soft, non-tender to palpation  Lab Results:  Recent Labs    05/27/24 0724 05/28/24 0442  WBC 8.8 10.1  HGB 8.8* 9.0*  HCT 26.7* 27.6*  PLT 211 230   BMET No results for input(s): NA, K, CL, CO2, GLUCOSE, BUN, CREATININE, CALCIUM  in the last 72 hours. PT/INR No results for input(s): LABPROT, INR in the last 72 hours. Comprehensive Metabolic Panel:    Component Value Date/Time   NA 136 05/25/2024 0714   NA 132 (L) 05/24/2024 0449   NA 140 06/15/2019 1307   K 4.2 05/25/2024 0714   K 4.2 05/24/2024 0449   CL 105 05/25/2024 0714   CL 102 05/24/2024 0449   CO2 20 (L) 05/25/2024 0714   CO2 19 (L) 05/24/2024 0449   BUN 9 05/25/2024 0714   BUN 23 (H) 05/24/2024 0449   BUN 15 06/15/2019 1307   CREATININE 0.53 05/25/2024 0714   CREATININE 0.69 05/24/2024 0449   GLUCOSE 135 (H) 05/25/2024 0714   GLUCOSE 139 (H) 05/24/2024 0449   CALCIUM  7.7 (L) 05/25/2024 0714   CALCIUM  7.9 (L) 05/24/2024 0449   AST 26 05/23/2024 1200   AST 33 09/14/2019 0320   ALT 16 05/23/2024 1200   ALT 14 09/14/2019 0320   ALKPHOS 40 05/23/2024 1200   ALKPHOS 51 09/14/2019 0320   BILITOT 0.4 05/23/2024 1200   BILITOT 0.6 09/14/2019 0320   BILITOT 0.3 06/15/2019 1307  PROT 5.2 (L) 05/23/2024 1200   PROT 5.4 (L) 09/14/2019 0320   PROT 6.5 06/15/2019 1307   ALBUMIN 2.5 (L) 05/23/2024 1200   ALBUMIN 2.0 (L) 09/14/2019 0320   ALBUMIN 3.5 (L) 06/15/2019 1307    Studies/Results: No results found.    Krystal Spinner 05/28/2024  Patient ID: Alexandria Harvey, female   DOB: 1980/12/13, 43 y.o.   MRN: [id number]

## 2024-05-28 NOTE — TOC Progression Note (Signed)
 Transition of Care [facility]) - Progression Note    Patient Details  Name: Alexandria Harvey MRN: [id number] Date of Birth: [birth year]  Transition [facility]) CM/SW Contact  Gwenn Julien Norris, [place] Phone Number: 05/28/2024, 11:52 AM  Clinical Narrative: Per Trauma APP, plan for dc to SNF tomorrow. [facility] at [facility] updated.   Julien Gwenn, MSW, LCSW [phone] (coverage)        Expected Discharge Plan: Skilled Nursing Facility Barriers to Discharge: Continued Medical Work up               Expected Discharge Plan and Services       Living arrangements for the past 2 months: Group Home                                       Social Drivers of Health (SDOH) Interventions SDOH Screenings   Food Insecurity: Patient Unable To Answer (05/24/2024)  Housing: Patient Unable To Answer (05/24/2024)  Transportation Needs: Patient Unable To Answer (05/24/2024)  Utilities: Patient Unable To Answer (05/24/2024)  Financial Resource Strain: High Risk (02/29/2024)   Received from [facility]  Physical Activity: Unknown (02/29/2024)   Received from [facility]  Social Connections: Moderately Integrated (02/29/2024)   Received from [facility]  Stress: No Stress Concern Present (02/29/2024)   Received from [facility]  Tobacco Use: Low Risk  (05/12/2024)    Readmission Risk Interventions     No data to display

## 2024-05-28 NOTE — Plan of Care (Signed)
 Problem: Education: Goal: Ability to describe self-care measures that may prevent or decrease complications (Diabetes Survival Skills Education) will improve 05/28/2024 0055 by Marvis Kenneth SAILOR, RN Outcome: Progressing 05/27/2024 2114 by Marvis Kenneth SAILOR, RN Outcome: Progressing Goal: Individualized Educational Video(s) 05/28/2024 0055 by Marvis Kenneth SAILOR, RN Outcome: Progressing 05/27/2024 2114 by Marvis Kenneth SAILOR, RN Outcome: Progressing   Problem: Coping: Goal: Ability to adjust to condition or change in health will improve 05/28/2024 0055 by Marvis Kenneth SAILOR, RN Outcome: Progressing 05/27/2024 2114 by Marvis Kenneth SAILOR, RN Outcome: Progressing   Problem: Fluid Volume: Goal: Ability to maintain a balanced intake and output will improve 05/28/2024 0055 by Marvis Kenneth SAILOR, RN Outcome: Progressing 05/27/2024 2114 by Marvis Kenneth SAILOR, RN Outcome: Progressing   Problem: Health Behavior/Discharge Planning: Goal: Ability to identify and utilize available resources and services will improve 05/28/2024 0055 by Marvis Kenneth SAILOR, RN Outcome: Progressing 05/27/2024 2114 by Marvis Kenneth SAILOR, RN Outcome: Progressing Goal: Ability to manage health-related needs will improve 05/28/2024 0055 by Marvis Kenneth SAILOR, RN Outcome: Progressing 05/27/2024 2114 by Marvis Kenneth SAILOR, RN Outcome: Progressing   Problem: Metabolic: Goal: Ability to maintain appropriate glucose levels will improve 05/28/2024 0055 by Marvis Kenneth SAILOR, RN Outcome: Progressing 05/27/2024 2114 by Marvis Kenneth SAILOR, RN Outcome: Progressing   Problem: Nutritional: Goal: Maintenance of adequate nutrition will improve 05/28/2024 0055 by Marvis Kenneth SAILOR, RN Outcome: Progressing 05/27/2024 2114 by Marvis Kenneth SAILOR, RN Outcome: Progressing Goal: Progress toward achieving an optimal weight will improve 05/28/2024 0055 by Marvis Kenneth SAILOR, RN Outcome: Progressing 05/27/2024 2114 by Marvis Kenneth SAILOR,  RN Outcome: Progressing   Problem: Skin Integrity: Goal: Risk for impaired skin integrity will decrease 05/28/2024 0055 by Marvis Kenneth SAILOR, RN Outcome: Progressing 05/27/2024 2114 by Marvis Kenneth SAILOR, RN Outcome: Progressing   Problem: Tissue Perfusion: Goal: Adequacy of tissue perfusion will improve 05/28/2024 0055 by Marvis Kenneth SAILOR, RN Outcome: Progressing 05/27/2024 2114 by Marvis Kenneth SAILOR, RN Outcome: Progressing   Problem: Education: Goal: Knowledge of General Education information will improve Description: Including pain rating scale, medication(s)/side effects and non-pharmacologic comfort measures 05/28/2024 0055 by Marvis Kenneth SAILOR, RN Outcome: Progressing 05/27/2024 2114 by Marvis Kenneth SAILOR, RN Outcome: Progressing   Problem: Health Behavior/Discharge Planning: Goal: Ability to manage health-related needs will improve 05/28/2024 0055 by Marvis Kenneth SAILOR, RN Outcome: Progressing 05/27/2024 2114 by Marvis Kenneth SAILOR, RN Outcome: Progressing   Problem: Clinical Measurements: Goal: Ability to maintain clinical measurements within normal limits will improve 05/28/2024 0055 by Marvis Kenneth SAILOR, RN Outcome: Progressing 05/27/2024 2114 by Marvis Kenneth SAILOR, RN Outcome: Progressing Goal: Will remain free from infection 05/28/2024 0055 by Marvis Kenneth SAILOR, RN Outcome: Progressing 05/27/2024 2114 by Marvis Kenneth SAILOR, RN Outcome: Progressing Goal: Diagnostic test results will improve 05/28/2024 0055 by Marvis Kenneth SAILOR, RN Outcome: Progressing 05/27/2024 2114 by Marvis Kenneth SAILOR, RN Outcome: Progressing Goal: Respiratory complications will improve 05/28/2024 0055 by Marvis Kenneth SAILOR, RN Outcome: Progressing 05/27/2024 2114 by Marvis Kenneth SAILOR, RN Outcome: Progressing Goal: Cardiovascular complication will be avoided 05/28/2024 0055 by Marvis Kenneth SAILOR, RN Outcome: Progressing 05/27/2024 2114 by Marvis Kenneth SAILOR, RN Outcome: Progressing    Problem: Activity: Goal: Risk for activity intolerance will decrease 05/28/2024 0055 by Marvis Kenneth SAILOR, RN Outcome: Progressing 05/27/2024 2114 by Marvis Kenneth SAILOR, RN Outcome: Progressing   Problem: Nutrition: Goal: Adequate nutrition will be maintained 05/28/2024 0055 by Marvis Kenneth SAILOR, RN Outcome: Progressing 05/27/2024 2114 by Marvis Kenneth SAILOR, RN Outcome: Progressing  Problem: Coping: Goal: Level of anxiety will decrease 05/28/2024 0055 by Marvis Kenneth SAILOR, RN Outcome: Progressing 05/27/2024 2114 by Marvis Kenneth SAILOR, RN Outcome: Progressing   Problem: Elimination: Goal: Will not experience complications related to bowel motility 05/28/2024 0055 by Marvis Kenneth SAILOR, RN Outcome: Progressing 05/27/2024 2114 by Marvis Kenneth SAILOR, RN Outcome: Progressing Goal: Will not experience complications related to urinary retention 05/28/2024 0055 by Marvis Kenneth SAILOR, RN Outcome: Progressing 05/27/2024 2114 by Marvis Kenneth SAILOR, RN Outcome: Progressing   Problem: Pain Managment: Goal: General experience of comfort will improve and/or be controlled 05/28/2024 0055 by Marvis Kenneth SAILOR, RN Outcome: Progressing 05/27/2024 2114 by Marvis Kenneth SAILOR, RN Outcome: Progressing   Problem: Safety: Goal: Ability to remain free from injury will improve 05/28/2024 0055 by Marvis Kenneth SAILOR, RN Outcome: Progressing 05/27/2024 2114 by Marvis Kenneth SAILOR, RN Outcome: Progressing   Problem: Skin Integrity: Goal: Risk for impaired skin integrity will decrease 05/28/2024 0055 by Marvis Kenneth SAILOR, RN Outcome: Progressing 05/27/2024 2114 by Marvis Kenneth SAILOR, RN Outcome: Progressing

## 2024-05-29 LAB — CBC
HCT: 29.8 % — ABNORMAL LOW (ref 36.0–46.0)
Hemoglobin: 9.9 g/dL — ABNORMAL LOW (ref 12.0–15.0)
MCH: 28.6 pg (ref 26.0–34.0)
MCHC: 33.2 g/dL (ref 30.0–36.0)
MCV: 86.1 fL (ref 80.0–100.0)
Platelets: 292 K/uL (ref 150–400)
RBC: 3.46 MIL/uL — ABNORMAL LOW (ref 3.87–5.11)
RDW: 17.2 % — ABNORMAL HIGH (ref 11.5–15.5)
WBC: 11.5 K/uL — ABNORMAL HIGH (ref 4.0–10.5)
nRBC: 1.7 % — ABNORMAL HIGH (ref 0.0–0.2)

## 2024-05-29 LAB — GLUCOSE, CAPILLARY
Glucose-Capillary: 156 mg/dL — ABNORMAL HIGH (ref 70–99)
Glucose-Capillary: 208 mg/dL — ABNORMAL HIGH (ref 70–99)

## 2024-05-29 MED ORDER — ACETAMINOPHEN 500 MG PO TABS: 1000.0000 mg | ORAL_TABLET | Freq: Three times a day (TID) | ORAL | Status: AC | PRN

## 2024-05-29 MED ORDER — NALOXONE HCL 0.4 MG/ML IJ SOLN: 0.4000 mg | INTRAMUSCULAR | Status: DC | PRN

## 2024-05-29 MED ORDER — OXYCODONE HCL 5 MG PO TABS: 2.5000 mg | ORAL_TABLET | Freq: Four times a day (QID) | ORAL | 0 refills | Status: DC | PRN

## 2024-05-29 MED ORDER — LUBIPROSTONE 24 MCG PO CAPS: 24.0000 ug | ORAL_CAPSULE | Freq: Every day | ORAL | Status: AC

## 2024-05-29 MED ORDER — METHOCARBAMOL 500 MG PO TABS: 1000.0000 mg | ORAL_TABLET | Freq: Three times a day (TID) | ORAL | Status: DC | PRN

## 2024-05-29 MED ORDER — GABAPENTIN 300 MG PO CAPS: 300.0000 mg | ORAL_CAPSULE | Freq: Three times a day (TID) | ORAL | Status: DC

## 2024-05-29 MED ORDER — VITAMIN D (ERGOCALCIFEROL) 1.25 MG (50000 UNIT) PO CAPS: 50000.0000 [IU] | ORAL_CAPSULE | ORAL | Status: AC

## 2024-05-29 NOTE — Plan of Care (Signed)
 Problem: Education: Goal: Ability to describe self-care measures that may prevent or decrease complications (Diabetes Survival Skills Education) will improve 05/29/2024 0022 by Marvis Kenneth SAILOR, RN Outcome: Progressing 05/28/2024 2044 by Marvis Kenneth SAILOR, RN Outcome: Progressing Goal: Individualized Educational Video(s) 05/29/2024 0022 by Marvis Kenneth SAILOR, RN Outcome: Progressing 05/28/2024 2044 by Marvis Kenneth SAILOR, RN Outcome: Progressing   Problem: Coping: Goal: Ability to adjust to condition or change in health will improve 05/29/2024 0022 by Marvis Kenneth SAILOR, RN Outcome: Progressing 05/28/2024 2044 by Marvis Kenneth SAILOR, RN Outcome: Progressing   Problem: Fluid Volume: Goal: Ability to maintain a balanced intake and output will improve 05/29/2024 0022 by Marvis Kenneth SAILOR, RN Outcome: Progressing 05/28/2024 2044 by Marvis Kenneth SAILOR, RN Outcome: Progressing   Problem: Health Behavior/Discharge Planning: Goal: Ability to identify and utilize available resources and services will improve 05/29/2024 0022 by Marvis Kenneth SAILOR, RN Outcome: Progressing 05/28/2024 2044 by Marvis Kenneth SAILOR, RN Outcome: Progressing Goal: Ability to manage health-related needs will improve 05/29/2024 0022 by Marvis Kenneth SAILOR, RN Outcome: Progressing 05/28/2024 2044 by Marvis Kenneth SAILOR, RN Outcome: Progressing   Problem: Metabolic: Goal: Ability to maintain appropriate glucose levels will improve 05/29/2024 0022 by Marvis Kenneth SAILOR, RN Outcome: Progressing 05/28/2024 2044 by Marvis Kenneth SAILOR, RN Outcome: Progressing   Problem: Nutritional: Goal: Maintenance of adequate nutrition will improve 05/29/2024 0022 by Marvis Kenneth SAILOR, RN Outcome: Progressing 05/28/2024 2044 by Marvis Kenneth SAILOR, RN Outcome: Progressing Goal: Progress toward achieving an optimal weight will improve 05/29/2024 0022 by Marvis Kenneth SAILOR, RN Outcome: Progressing 05/28/2024 2044 by Marvis Kenneth SAILOR,  RN Outcome: Progressing   Problem: Skin Integrity: Goal: Risk for impaired skin integrity will decrease 05/29/2024 0022 by Marvis Kenneth SAILOR, RN Outcome: Progressing 05/28/2024 2044 by Marvis Kenneth SAILOR, RN Outcome: Progressing   Problem: Tissue Perfusion: Goal: Adequacy of tissue perfusion will improve 05/29/2024 0022 by Marvis Kenneth SAILOR, RN Outcome: Progressing 05/28/2024 2044 by Marvis Kenneth SAILOR, RN Outcome: Progressing   Problem: Education: Goal: Knowledge of General Education information will improve Description: Including pain rating scale, medication(s)/side effects and non-pharmacologic comfort measures 05/29/2024 0022 by Marvis Kenneth SAILOR, RN Outcome: Progressing 05/28/2024 2044 by Marvis Kenneth SAILOR, RN Outcome: Progressing   Problem: Health Behavior/Discharge Planning: Goal: Ability to manage health-related needs will improve 05/29/2024 0022 by Marvis Kenneth SAILOR, RN Outcome: Progressing 05/28/2024 2044 by Marvis Kenneth SAILOR, RN Outcome: Progressing   Problem: Clinical Measurements: Goal: Ability to maintain clinical measurements within normal limits will improve 05/29/2024 0022 by Marvis Kenneth SAILOR, RN Outcome: Progressing 05/28/2024 2044 by Marvis Kenneth SAILOR, RN Outcome: Progressing Goal: Will remain free from infection 05/29/2024 0022 by Marvis Kenneth SAILOR, RN Outcome: Progressing 05/28/2024 2044 by Marvis Kenneth SAILOR, RN Outcome: Progressing Goal: Diagnostic test results will improve 05/29/2024 0022 by Marvis Kenneth SAILOR, RN Outcome: Progressing 05/28/2024 2044 by Marvis Kenneth SAILOR, RN Outcome: Progressing Goal: Respiratory complications will improve 05/29/2024 0022 by Marvis Kenneth SAILOR, RN Outcome: Progressing 05/28/2024 2044 by Marvis Kenneth SAILOR, RN Outcome: Progressing Goal: Cardiovascular complication will be avoided 05/29/2024 0022 by Marvis Kenneth SAILOR, RN Outcome: Progressing 05/28/2024 2044 by Marvis Kenneth SAILOR, RN Outcome: Progressing    Problem: Activity: Goal: Risk for activity intolerance will decrease 05/29/2024 0022 by Marvis Kenneth SAILOR, RN Outcome: Progressing 05/28/2024 2044 by Marvis Kenneth SAILOR, RN Outcome: Progressing   Problem: Nutrition: Goal: Adequate nutrition will be maintained 05/29/2024 0022 by Marvis Kenneth SAILOR, RN Outcome: Progressing 05/28/2024 2044 by Marvis Kenneth SAILOR, RN Outcome: Progressing  Problem: Coping: Goal: Level of anxiety will decrease 05/29/2024 0022 by Marvis Kenneth SAILOR, RN Outcome: Progressing 05/28/2024 2044 by Marvis Kenneth SAILOR, RN Outcome: Progressing   Problem: Elimination: Goal: Will not experience complications related to bowel motility 05/29/2024 0022 by Marvis Kenneth SAILOR, RN Outcome: Progressing 05/28/2024 2044 by Marvis Kenneth SAILOR, RN Outcome: Progressing Goal: Will not experience complications related to urinary retention 05/29/2024 0022 by Marvis Kenneth SAILOR, RN Outcome: Progressing 05/28/2024 2044 by Marvis Kenneth SAILOR, RN Outcome: Progressing   Problem: Pain Managment: Goal: General experience of comfort will improve and/or be controlled 05/29/2024 0022 by Marvis Kenneth SAILOR, RN Outcome: Progressing 05/28/2024 2044 by Marvis Kenneth SAILOR, RN Outcome: Progressing   Problem: Safety: Goal: Ability to remain free from injury will improve 05/29/2024 0022 by Marvis Kenneth SAILOR, RN Outcome: Progressing 05/28/2024 2044 by Marvis Kenneth SAILOR, RN Outcome: Progressing   Problem: Skin Integrity: Goal: Risk for impaired skin integrity will decrease 05/29/2024 0022 by Marvis Kenneth SAILOR, RN Outcome: Progressing 05/28/2024 2044 by Marvis Kenneth SAILOR, RN Outcome: Progressing

## 2024-05-29 NOTE — Progress Notes (Signed)
 Report called to RN at [facility].  All questions answered.  Currently awaiting transport via [facility].

## 2024-05-29 NOTE — Discharge Summary (Signed)
 Patient ID: Alexandria Harvey [id number] 12/10/1980 43 y.o.  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 Patient has known left foot/ankle fracture in a CAM boot and lost her footing while ambulating. She fell forward and hit her face on the ledge of a wooden platform. Significant facial laceration noted. Patient complained of severe neck pain and numbness in upper extremities. EMS reported she was unable to move upper extremities but was moving bilateral lower extremities. She also complained of headache and abdominal pain. PMH significant for intellectual disability, schizophrenia and diabetes. She has a caregiver present along with her mother who report that patient is at baseline level of orientation.   Procedures Dr. Elspeth Coddington - 05/24/24 Repair complex forehead laceration 14cm   Hospital Course:  Patient presented as above after a fall and was found to have below injuries.   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 Lanis. CTO brace at all times. No surgery planned acutely. Mobilize with PT/OT. Multimodal pain control   T4/T5 fractures - Per NSGY. As above.    Forehead laceration - ENT consulted, closed 9/17 by Dr. Coddington. Dressing to remain intact 5-7 days. Postop office visit in 7-10 days; will require staple removal from scalp    Recent left foot/ankle fracture - CAM boot. Per UC note, she is supposed to follow up with [facility]   T2DM - SSI here  Schizophrenia and intellectual delay - Continue home meds  Hx HTN - BP previously running soft. Home BP meds held and placed on IVF. Good UOP. Hgb stable on last check. Maximized non-narcotics (previously required narcan ) - She is on lower end of scale of prn pain medication here. Could be a neurogenic component as well given C-spine fx's with central cord syndrome. Improved. Follow up with PCP.   Patient worked with therapies during admission. TOC arranged d/c to SNF. On 9/22 the patient was voiding well, tolerating diet, working with therapies, pain well controlled, vital signs stable, incision dressing c/d/i and felt stable for discharge to SNF.   Physical Exam: General: Pleasant female who is laying in bed in NAD. HEENT: Forehead laceration covered with dressing. Dressing clean Neck: CTO in place Heart: Reg, DP 2+ b/l Lungs: CTA b/l. Normal rate and effort.  Abd: Soft, NT, ND. MS: No LE edema Skin: Warm and dry  Psych: A&Ox4   Allergies as of 05/29/2024       Reactions   Haloperidol Lactate Other (See Comments)   Unknown reaction   Shrimp [shellfish Allergy] Hives, Rash   Strawberry (diagnostic) Rash        Medication List     PAUSE taking these medications    atenolol  50 MG tablet Wait to take this until your doctor or other care provider tells you to start again. Commonly known as: TENORMIN  Take 1 tablet (50 mg total) by mouth daily. What changed: when to take this  lisinopril  2.5 MG tablet Wait to take this until your doctor or other care provider tells you to start again. Commonly known as: ZESTRIL  Take 1 tablet (2.5 mg total) by mouth daily. What changed: when to take this       STOP taking these medications    LORazepam  0.5 MG tablet Commonly known as: ATIVAN        TAKE these medications    acetaminophen  500 MG  tablet Commonly known as: TYLENOL  Take 2 tablets (1,000 mg total) by mouth every 8 (eight) hours as needed.   atorvastatin  80 MG tablet Commonly known as: LIPITOR  Take 1 tablet (80 mg total) by mouth at bedtime.   benztropine  1 MG tablet Commonly known as: COGENTIN  Take 1 mg by mouth 2 (two) times daily.   cloZAPine  100 MG tablet Commonly known as: CLOZARIL  Take 100 mg by mouth 2 (two) times daily.   divalproex  500 MG DR tablet Commonly known as: DEPAKOTE  Take 1 tablet (500 mg total) by mouth 3 (three) times daily. What changed:  how much to take when to take this additional instructions   ezetimibe 10 MG tablet Commonly known as: ZETIA Take 10 mg by mouth daily.   ferrous sulfate  325 (65 FE) MG tablet Take 1 tablet (325 mg total) by mouth daily with breakfast.   gabapentin  300 MG capsule Commonly known as: NEURONTIN  Take 1 capsule (300 mg total) by mouth 3 (three) times daily.   lubiprostone  24 MCG capsule Commonly known as: AMITIZA  Take 1 capsule (24 mcg total) by mouth daily with breakfast. Start taking on: May 30, 2024   metFORMIN  500 MG tablet Commonly known as: GLUCOPHAGE  Take 1,000 mg by mouth 2 (two) times daily with a meal.   methocarbamol  500 MG tablet Commonly known as: ROBAXIN  Take 2 tablets (1,000 mg total) by mouth every 8 (eight) hours as needed for muscle spasms.   naloxone  0.4 MG/ML injection Commonly known as: NARCAN  Inject 1 mL (0.4 mg total) into the vein as needed.   omeprazole 20 MG capsule Commonly known as: PRILOSEC Take 20 mg by mouth in the morning and at bedtime.   oxyCODONE  5 MG immediate release tablet Commonly known as: Oxy IR/ROXICODONE  Take 0.5-1 tablets (2.5-5 mg total) by mouth every 6 (six) hours as needed for breakthrough pain (2.5 mg for moderate, 5 mg for severe).   Oyster Shell Calcium  w/D 500-5 MG-MCG Tabs Take 1 tablet by mouth 2 (two) times daily.   polyethylene glycol 17 g packet Commonly known as:  MIRALAX  / GLYCOLAX  Take 17 g by mouth 2 (two) times daily.   simethicone  80 MG chewable tablet Commonly known as: MYLICON Chew 80 mg by mouth 2 (two) times daily.   Vitamin D  (Ergocalciferol ) 1.25 MG (50000 UNIT) Caps capsule Commonly known as: DRISDOL  Take 1 capsule (50,000 Units total) by mouth once a week. Start taking on: June 03, 2024 What changed:  medication strength See the new instructions.          Contact information for follow-up providers     Luciano Standing, MD. Schedule an appointment as soon as possible for a visit.   Specialty: Otolaryngology Why: Call to arrange a follow up and postop office visit between 9/24 - 9/27 for staple removal from scalp Contact information: [address] [place] [address] [phone]         Lanis Pupa, MD Follow up.   Specialty: Neurosurgery Why: For follow up of your cervical and thoracic spine fractures.  Contact information: [address] [place] [address] [phone]         Specialists, [facility]. Call.   Specialty: Orthopedic Surgery Why: To arrange a follow up of your recent left foot/ankle fracture Contact information: [facility] [address] [place] [address] [phone]         [facility]ates, [facility]. Schedule an appointment as soon as possible for a visit.   Specialty: Family Medicine Why: For follow up regarding your blood pressure medication and post hospitalization follow up Contact information: [address] [place] [address] [phone]         [facility] Follow up.   Why: As needed Contact information: [address] [place]  [address] [phone]             Contact information for after-discharge care     Destination     [facility] .   Service: Skilled  Nursing Contact information: [address] [place]  [address] [phone]                     Signed: Ozell CHRISTELLA Shaper, [facility] 05/29/2024, 10:57 AM Please see Amion for pager number during day hours 7:00am-4:30pm

## 2024-05-29 NOTE — TOC Transition Note (Signed)
 Transition of Care [facility]) - Discharge Note   Patient Details  Name: Alexandria Harvey MRN: [id number] Date of Birth: 1981/07/19  [facility] CM/SW Contact:  Maxtyn Nuzum E Kyrah Schiro, LCSW Phone Number: 05/29/2024, 10:26 AM   Clinical Narrative:    Discharge to [facility] today. Room 125A. Confirmed with Admissions Worker Kia. Updated MD, RN, and [facility] DSS Legal Guardian Nat Duos.  Asked RN to call report. EMS paperwork completed. [facility] arranged for 11:30 pick up per RN request.    Final next level of care: Skilled Nursing Facility Barriers to Discharge: Barriers Resolved   Patient Goals and CMS Choice   CMS Medicare.gov Compare Post Acute Care list provided to:: Legal Guardian Choice offered to / list presented to : [facility] POA / Guardian      Discharge Placement              Patient chooses bed at: [facility] Patient to be transferred to facility by: [facility] Name of family member notified: Nat Duos Patient and family notified of of transfer: 05/29/24  Discharge Plan and Services Additional resources added to the After Visit Summary for                                       Social Drivers of Health (SDOH) Interventions SDOH Screenings   Food Insecurity: Patient Unable To Answer (05/24/2024)  Housing: Patient Unable To Answer (05/24/2024)  Transportation Needs: Patient Unable To Answer (05/24/2024)  Utilities: Patient Unable To Answer (05/24/2024)  Financial Resource Strain: High Risk (02/29/2024)   Received from [facility]  Physical Activity: Unknown (02/29/2024)   Received from [facility]  Social Connections: Moderately Integrated (02/29/2024)   Received from [facility]  Stress: No Stress Concern Present (02/29/2024)   Received from [facility]  Tobacco Use: Low Risk  (05/12/2024)     Readmission Risk Interventions     No data to display

## 2024-06-11 ENCOUNTER — Other Ambulatory Visit: Payer: Self-pay

## 2024-06-11 ENCOUNTER — Emergency Department (HOSPITAL_COMMUNITY)

## 2024-06-11 ENCOUNTER — Inpatient Hospital Stay (HOSPITAL_COMMUNITY)
Admission: EM | Admit: 2024-06-11 | Discharge: 2024-06-14 | DRG: 871 | Disposition: A | Discharge status: Skilled Nursing Facility | Source: Skilled Nursing Facility | Attending: Infectious Diseases | Admitting: Infectious Diseases

## 2024-06-11 ENCOUNTER — Inpatient Hospital Stay (HOSPITAL_COMMUNITY)

## 2024-06-11 ENCOUNTER — Encounter (HOSPITAL_COMMUNITY): Payer: Self-pay

## 2024-06-11 DIAGNOSIS — E8809 Other disorders of plasma-protein metabolism, not elsewhere classified: Secondary | ICD-10-CM | POA: Diagnosis present

## 2024-06-11 DIAGNOSIS — Z888 Allergy status to other drugs, medicaments and biological substances status: Secondary | ICD-10-CM

## 2024-06-11 DIAGNOSIS — G928 Other toxic encephalopathy: Secondary | ICD-10-CM | POA: Diagnosis present

## 2024-06-11 DIAGNOSIS — E119 Type 2 diabetes mellitus without complications: Secondary | ICD-10-CM | POA: Diagnosis present

## 2024-06-11 DIAGNOSIS — E871 Hypo-osmolality and hyponatremia: Secondary | ICD-10-CM | POA: Diagnosis present

## 2024-06-11 DIAGNOSIS — Y636 Underdosing and nonadministration of necessary drug, medicament or biological substance: Secondary | ICD-10-CM | POA: Diagnosis present

## 2024-06-11 DIAGNOSIS — A419 Sepsis, unspecified organism: Principal | ICD-10-CM | POA: Diagnosis present

## 2024-06-11 DIAGNOSIS — Z79899 Other long term (current) drug therapy: Secondary | ICD-10-CM

## 2024-06-11 DIAGNOSIS — L89322 Pressure ulcer of left buttock, stage 2: Secondary | ICD-10-CM | POA: Diagnosis present

## 2024-06-11 DIAGNOSIS — I10 Essential (primary) hypertension: Secondary | ICD-10-CM | POA: Diagnosis present

## 2024-06-11 DIAGNOSIS — L89153 Pressure ulcer of sacral region, stage 3: Secondary | ICD-10-CM | POA: Diagnosis not present

## 2024-06-11 DIAGNOSIS — S14123A Central cord syndrome at C3 level of cervical spinal cord, initial encounter: Secondary | ICD-10-CM | POA: Diagnosis present

## 2024-06-11 DIAGNOSIS — Z7984 Long term (current) use of oral hypoglycemic drugs: Secondary | ICD-10-CM

## 2024-06-11 DIAGNOSIS — Z833 Family history of diabetes mellitus: Secondary | ICD-10-CM | POA: Diagnosis not present

## 2024-06-11 DIAGNOSIS — Z9981 Dependence on supplemental oxygen: Secondary | ICD-10-CM

## 2024-06-11 DIAGNOSIS — Z9102 Food additives allergy status: Secondary | ICD-10-CM | POA: Diagnosis not present

## 2024-06-11 DIAGNOSIS — Z1152 Encounter for screening for COVID-19: Secondary | ICD-10-CM | POA: Diagnosis not present

## 2024-06-11 DIAGNOSIS — G40919 Epilepsy, unspecified, intractable, without status epilepticus: Secondary | ICD-10-CM | POA: Diagnosis not present

## 2024-06-11 DIAGNOSIS — S14127D Central cord syndrome at C7 level of cervical spinal cord, subsequent encounter: Secondary | ICD-10-CM | POA: Diagnosis not present

## 2024-06-11 DIAGNOSIS — J9621 Acute and chronic respiratory failure with hypoxia: Secondary | ICD-10-CM | POA: Diagnosis present

## 2024-06-11 DIAGNOSIS — E876 Hypokalemia: Secondary | ICD-10-CM | POA: Diagnosis not present

## 2024-06-11 DIAGNOSIS — S12601A Unspecified nondisplaced fracture of seventh cervical vertebra, initial encounter for closed fracture: Secondary | ICD-10-CM | POA: Diagnosis present

## 2024-06-11 DIAGNOSIS — L89150 Pressure ulcer of sacral region, unstageable: Secondary | ICD-10-CM | POA: Diagnosis present

## 2024-06-11 DIAGNOSIS — F70 Mild intellectual disabilities: Secondary | ICD-10-CM | POA: Diagnosis present

## 2024-06-11 DIAGNOSIS — S12600A Unspecified displaced fracture of seventh cervical vertebra, initial encounter for closed fracture: Secondary | ICD-10-CM | POA: Diagnosis present

## 2024-06-11 DIAGNOSIS — E785 Hyperlipidemia, unspecified: Secondary | ICD-10-CM | POA: Diagnosis present

## 2024-06-11 DIAGNOSIS — D649 Anemia, unspecified: Secondary | ICD-10-CM | POA: Diagnosis not present

## 2024-06-11 DIAGNOSIS — Z91013 Allergy to seafood: Secondary | ICD-10-CM

## 2024-06-11 DIAGNOSIS — F209 Schizophrenia, unspecified: Secondary | ICD-10-CM | POA: Diagnosis present

## 2024-06-11 DIAGNOSIS — S12601D Unspecified nondisplaced fracture of seventh cervical vertebra, subsequent encounter for fracture with routine healing: Secondary | ICD-10-CM

## 2024-06-11 DIAGNOSIS — D72829 Elevated white blood cell count, unspecified: Secondary | ICD-10-CM

## 2024-06-11 DIAGNOSIS — G9340 Encephalopathy, unspecified: Secondary | ICD-10-CM | POA: Diagnosis present

## 2024-06-11 DIAGNOSIS — R569 Unspecified convulsions: Secondary | ICD-10-CM

## 2024-06-11 DIAGNOSIS — G40909 Epilepsy, unspecified, not intractable, without status epilepticus: Secondary | ICD-10-CM | POA: Diagnosis present

## 2024-06-11 DIAGNOSIS — Z91018 Allergy to other foods: Secondary | ICD-10-CM

## 2024-06-11 LAB — COMPREHENSIVE METABOLIC PANEL WITH GFR
ALT: 18 U/L (ref 0–44)
AST: 30 U/L (ref 15–41)
Albumin: 2.4 g/dL — ABNORMAL LOW (ref 3.5–5.0)
Alkaline Phosphatase: 53 U/L (ref 38–126)
Anion gap: 15 (ref 5–15)
BUN: 22 mg/dL — ABNORMAL HIGH (ref 6–20)
CO2: 15 mmol/L — ABNORMAL LOW (ref 22–32)
Calcium: 8.3 mg/dL — ABNORMAL LOW (ref 8.9–10.3)
Chloride: 100 mmol/L (ref 98–111)
Creatinine, Ser: 0.77 mg/dL (ref 0.44–1.00)
GFR, Estimated: >60 mL/min (ref >60)
Glucose, Bld: 180 mg/dL — ABNORMAL HIGH (ref 70–99)
Potassium: 4.6 mmol/L (ref 3.5–5.1)
Sodium: 130 mmol/L — ABNORMAL LOW (ref 135–145)
Total Bilirubin: 0.9 mg/dL (ref 0.0–1.2)
Total Protein: 6.4 g/dL — ABNORMAL LOW (ref 6.5–8.1)

## 2024-06-11 LAB — CBC WITH DIFFERENTIAL/PLATELET
Abs Immature Granulocytes: 0.06 K/uL (ref 0.00–0.07)
Basophils Absolute: 0 K/uL (ref 0.0–0.1)
Basophils Relative: 0 %
Eosinophils Absolute: 0 K/uL (ref 0.0–0.5)
Eosinophils Relative: 0 %
HCT: 42.8 % (ref 36.0–46.0)
Hemoglobin: 13.7 g/dL (ref 12.0–15.0)
Immature Granulocytes: 1 %
Lymphocytes Relative: 17 %
Lymphs Abs: 1.9 K/uL (ref 0.7–4.0)
MCH: 27.7 pg (ref 26.0–34.0)
MCHC: 32 g/dL (ref 30.0–36.0)
MCV: 86.5 fL (ref 80.0–100.0)
Monocytes Absolute: 0.9 K/uL (ref 0.1–1.0)
Monocytes Relative: 9 %
Neutro Abs: 7.8 K/uL — ABNORMAL HIGH (ref 1.7–7.7)
Neutrophils Relative %: 73 %
Platelets: 198 K/uL (ref 150–400)
RBC: 4.95 MIL/uL (ref 3.87–5.11)
RDW: 18.4 % — ABNORMAL HIGH (ref 11.5–15.5)
WBC: 10.7 K/uL — ABNORMAL HIGH (ref 4.0–10.5)
nRBC: 0.2 % (ref 0.0–0.2)

## 2024-06-11 LAB — CBG MONITORING, ED
Glucose-Capillary: 173 mg/dL — ABNORMAL HIGH (ref 70–99)
Glucose-Capillary: 127 mg/dL — ABNORMAL HIGH (ref 70–99)

## 2024-06-11 LAB — I-STAT CG4 LACTIC ACID, ED
Lactic Acid, Venous: 2.8 mmol/L (ref 0.5–1.9)
Lactic Acid, Venous: 2.2 mmol/L (ref 0.5–1.9)

## 2024-06-11 LAB — HCG, SERUM, QUALITATIVE: Preg, Serum: NEGATIVE

## 2024-06-11 LAB — OSMOLALITY: Osmolality: 283 mosm/kg (ref 275–295)

## 2024-06-11 LAB — RESP PANEL BY RT-PCR (RSV, FLU A&B, COVID)  RVPGX2
Influenza A by PCR: NEGATIVE
Influenza B by PCR: NEGATIVE
Resp Syncytial Virus by PCR: NEGATIVE
SARS Coronavirus 2 by RT PCR: NEGATIVE

## 2024-06-11 LAB — LACTIC ACID, PLASMA
Lactic Acid, Venous: 2.3 mmol/L (ref 0.5–1.9)
Lactic Acid, Venous: 3.5 mmol/L (ref 0.5–1.9)

## 2024-06-11 LAB — PHOSPHORUS: Phosphorus: 3 mg/dL (ref 2.5–4.6)

## 2024-06-11 LAB — VALPROIC ACID LEVEL: Valproic Acid Lvl: 29 ug/mL — ABNORMAL LOW (ref 50–100)

## 2024-06-11 LAB — PROTIME-INR
INR: 1.1 (ref 0.8–1.2)
Prothrombin Time: 15.2 s (ref 11.4–15.2)

## 2024-06-11 LAB — AMMONIA: Ammonia: <13 umol/L (ref 9–35)

## 2024-06-11 LAB — MAGNESIUM: Magnesium: 1.9 mg/dL (ref 1.7–2.4)

## 2024-06-11 LAB — TSH: TSH: 3.296 u[IU]/mL (ref 0.350–4.500)

## 2024-06-11 MED ORDER — SODIUM CHLORIDE 0.9 % IV SOLN
2.0000 g | Freq: Three times a day (TID) | INTRAVENOUS | Status: DC
  Administered 2024-06-11: 2 g via INTRAVENOUS
  Filled 2024-06-11: qty 12.5

## 2024-06-11 MED ORDER — LACTATED RINGERS IV BOLUS
500.0000 mL | Freq: Once | INTRAVENOUS | Status: AC
  Administered 2024-06-11: 500 mL via INTRAVENOUS

## 2024-06-11 MED ORDER — VANCOMYCIN HCL 750 MG/150ML IV SOLN
750.0000 mg | INTRAVENOUS | Status: DC
  Administered 2024-06-12: 750 mg via INTRAVENOUS
  Filled 2024-06-11: qty 150

## 2024-06-11 MED ORDER — LACTATED RINGERS IV BOLUS (SEPSIS)
1000.0000 mL | Freq: Once | INTRAVENOUS | Status: AC
  Administered 2024-06-11: 1000 mL via INTRAVENOUS

## 2024-06-11 MED ORDER — ACETAMINOPHEN 650 MG RE SUPP: 650.0000 mg | Freq: Four times a day (QID) | RECTAL | Status: DC | PRN

## 2024-06-11 MED ORDER — VALPROATE SODIUM 100 MG/ML IV SOLN
500.0000 mg | Freq: Three times a day (TID) | INTRAVENOUS | Status: DC
  Administered 2024-06-12 – 2024-06-14 (×9): 500 mg via INTRAVENOUS
  Filled 2024-06-11: qty 5
  Filled 2024-06-11 (×2): qty 500
  Filled 2024-06-11 (×10): qty 5

## 2024-06-11 MED ORDER — LACTATED RINGERS IV BOLUS
1000.0000 mL | Freq: Once | INTRAVENOUS | Status: AC
  Administered 2024-06-11: 1000 mL via INTRAVENOUS

## 2024-06-11 MED ORDER — SODIUM CHLORIDE 0.9 % IV SOLN
500.0000 mg | Freq: Once | INTRAVENOUS | Status: AC
  Administered 2024-06-11: 500 mg via INTRAVENOUS
  Filled 2024-06-11: qty 5

## 2024-06-11 MED ORDER — ACETAMINOPHEN 325 MG PO TABS: 650.0000 mg | ORAL_TABLET | Freq: Four times a day (QID) | ORAL | Status: DC | PRN

## 2024-06-11 MED ORDER — VANCOMYCIN HCL 1500 MG/300ML IV SOLN
1500.0000 mg | Freq: Once | INTRAVENOUS | Status: AC
  Administered 2024-06-11: 1500 mg via INTRAVENOUS
  Filled 2024-06-11: qty 300

## 2024-06-11 MED ORDER — SODIUM CHLORIDE 0.9 % IV SOLN: 2.0000 g | Freq: Two times a day (BID) | INTRAVENOUS | Status: DC

## 2024-06-11 MED ORDER — LACTATED RINGERS IV SOLN: INTRAVENOUS | Status: DC

## 2024-06-11 MED ORDER — ACETAMINOPHEN 650 MG RE SUPP
RECTAL | Status: AC
  Filled 2024-06-11: qty 1

## 2024-06-11 MED ORDER — SODIUM CHLORIDE 0.9 % IV SOLN
2.0000 g | Freq: Once | INTRAVENOUS | Status: AC
  Administered 2024-06-11: 2 g via INTRAVENOUS
  Filled 2024-06-11: qty 20

## 2024-06-11 MED ORDER — PIPERACILLIN-TAZOBACTAM 3.375 G IVPB
3.3750 g | Freq: Three times a day (TID) | INTRAVENOUS | Status: DC
  Administered 2024-06-11 – 2024-06-12 (×2): 3.375 g via INTRAVENOUS
  Filled 2024-06-11 (×2): qty 50

## 2024-06-11 MED ORDER — DEXTROSE 5 % IV SOLN
1000.0000 mg | Freq: Once | INTRAVENOUS | Status: AC
  Administered 2024-06-11: 1000 mg via INTRAVENOUS
  Filled 2024-06-11: qty 10

## 2024-06-11 NOTE — ED Notes (Signed)
 This Clinical research associate and another RN cleaned patient up and attempted to straight cath. Straight cath unsuccessful, MD made aware.

## 2024-06-11 NOTE — ED Notes (Signed)
 In and out cath attempted by [facility] and this NT+3, attempt unsuccessful. Pt had a small loose BM and peri care was completed and new brief changed. Barrier cream applied to pressure wounds on sacrum. Pt repositioned in bed, home care aide at bedside.

## 2024-06-11 NOTE — ED Provider Notes (Addendum)
 [place] EMERGENCY DEPARTMENT AT [facility] Provider Note   CSN: [id number] Arrival date & time: 06/11/24  9053     Patient presents with: Seizures and Fever   Alexandria Harvey is a 43 y.o. female.   Pt is a 42y/o female with a significant past medical history for S/P fall with forehead laceration and C3, C5, C7 FXs with central cord type SCI just within the last 1 month who was discharged from the hospital on 22 September and has been at a rehab facility, hypertension, epilepsy on Depakote , diabetes and schizophrenia with mild MR who is presenting from the rehab facility today due to concern for a seizure.  They report patient is normally awake and alert x 4.  This morning staff noted that she was at her baseline and was doing fine and then approximately 45 minutes before arrival they started noticed she was twitching in her arms and legs which is unclear how long this lasted but she became unresponsive.  When EMS arrived patient was not having any seizure-like activity but was assumed to be postictal and groggy and would not respond verbally.  Facility also noted that her temperature was elevated at 103.  Patient only groans here and will not give any history but will open her eyes to stimulation.  The history is provided by the EMS personnel and medical records. The history is limited by the condition of the patient.  Seizures Fever      Prior to Admission medications   Medication Sig Start Date End Date Taking? Authorizing Provider  acetaminophen  (TYLENOL ) 500 MG tablet Take 2 tablets (1,000 mg total) by mouth every 8 (eight) hours as needed. 05/29/24   Maczis, Michael M, PA-C  atenolol  (TENORMIN ) 50 MG tablet Take 1 tablet (50 mg total) by mouth daily. Patient taking differently: Take 50 mg by mouth daily with breakfast. 01/25/15   Ragena Odor, MD  atorvastatin  (LIPITOR ) 80 MG tablet Take 1 tablet (80 mg total) by mouth at bedtime. 01/25/15    Ragena Odor, MD  benztropine  (COGENTIN ) 1 MG tablet Take 1 mg by mouth 2 (two) times daily.    [provider]  Calcium  Carb-Cholecalciferol (OYSTER SHELL CALCIUM  W/D) 500-5 MG-MCG TABS Take 1 tablet by mouth 2 (two) times daily. 05/03/24   [provider]  cloZAPine  (CLOZARIL ) 100 MG tablet Take 100 mg by mouth 2 (two) times daily.     [provider]  divalproex  (DEPAKOTE ) 500 MG DR tablet Take 1 tablet (500 mg total) by mouth 3 (three) times daily. Patient taking differently: Take 500-1,000 mg by mouth See admin instructions. Take 500 mg by mouth in the morning and 1000 mg by mouth in the afternoon 01/25/15   Ragena Odor, MD  ezetimibe (ZETIA) 10 MG tablet Take 10 mg by mouth daily. 05/09/24   [provider]  ferrous sulfate  325 (65 FE) MG tablet Take 1 tablet (325 mg total) by mouth daily with breakfast. 09/15/19 05/23/24  Verdene Purchase, MD  gabapentin  (NEURONTIN ) 300 MG capsule Take 1 capsule (300 mg total) by mouth 3 (three) times daily. 05/29/24   Maczis, Michael M, PA-C  lisinopril  (PRINIVIL ,ZESTRIL ) 2.5 MG tablet Take 1 tablet (2.5 mg total) by mouth daily. Patient taking differently: Take 2.5 mg by mouth daily with breakfast. 01/25/15   Ragena Odor, MD  lubiprostone  (AMITIZA ) 24 MCG capsule Take 1 capsule (24 mcg total) by mouth daily with breakfast. 05/30/24   Maczis, Michael M, PA-C  metFORMIN  (GLUCOPHAGE ) 500 MG  tablet Take 1,000 mg by mouth 2 (two) times daily with a meal.    [provider]  methocarbamol  (ROBAXIN ) 500 MG tablet Take 2 tablets (1,000 mg total) by mouth every 8 (eight) hours as needed for muscle spasms. 05/29/24   Maczis, Michael M, PA-C  naloxone  (NARCAN ) 0.4 MG/ML injection Inject 1 mL (0.4 mg total) into the vein as needed. 05/29/24   Maczis, Michael M, PA-C  omeprazole (PRILOSEC) 20 MG capsule Take 20 mg by mouth in the morning and at bedtime.    [provider]  oxyCODONE  (OXY  IR/ROXICODONE ) 5 MG immediate release tablet Take 0.5-1 tablets (2.5-5 mg total) by mouth every 6 (six) hours as needed for breakthrough pain (2.5 mg for moderate, 5 mg for severe). 05/29/24   Maczis, Michael M, PA-C  polyethylene glycol (MIRALAX  / GLYCOLAX ) packet Take 17 g by mouth 2 (two) times daily. Patient not taking: Reported on 05/23/2024 06/29/17   Leigh Elspeth SQUIBB, MD  simethicone  (MYLICON) 80 MG chewable tablet Chew 80 mg by mouth 2 (two) times daily.    [provider]  Vitamin D , Ergocalciferol , (DRISDOL ) 1.25 MG (50000 UNIT) CAPS capsule Take 1 capsule (50,000 Units total) by mouth once a week. 06/03/24   Maczis, Michael M, PA-C    Allergies: Haloperidol lactate, Shrimp [shellfish allergy], and Strawberry (diagnostic)    Review of Systems  Constitutional:  Positive for fever.  Neurological:  Positive for seizures.    Updated Vital Signs BP 106/73   Pulse (!) 116   Temp (!) 105.6 F (40.9 C) (Rectal)   Resp (!) 31   SpO2 94%   Physical Exam Vitals and nursing note reviewed.  Constitutional:      General: She is not in acute distress.    Appearance: She is well-developed.  HENT:     Head: Normocephalic and atraumatic.     Comments: Healing wound over the forehead with staples still present in the scalp    Mouth/Throat:     Mouth: Mucous membranes are dry.  Eyes:     Pupils: Pupils are equal, round, and reactive to light.  Neck:     Comments: C-collar and back brace in place Cardiovascular:     Rate and Rhythm: Regular rhythm. Tachycardia present.     Heart sounds: Normal heart sounds. No murmur heard.    No friction rub.  Pulmonary:     Effort: Pulmonary effort is normal.     Breath sounds: Rhonchi present. No wheezing or rales.     Comments: Rhonchi throughout all lung fields Abdominal:     General: Bowel sounds are normal. There is no distension.     Palpations: Abdomen is soft.     Tenderness: There is no abdominal tenderness. There is no  guarding or rebound.  Musculoskeletal:        General: No tenderness. Normal range of motion.     Right lower leg: No edema.     Left lower leg: No edema.     Comments: No edema  Skin:    General: Skin is warm and dry.     Findings: No rash.     Comments: Patient has a stage III sacral wound with some minimal erythema and ecchymosis but no frank purulent discharge induration or fluctuance  Neurological:     Mental Status: She is lethargic.     Comments: Patient will groan when saying her name and when turned to her side she will spontaneously open her eyes but  is still not awake enough to follow any commands.  She is not noted to be moving her arms or legs  Psychiatric:     Comments: Somnolent     (all labs ordered are listed, but only abnormal results are displayed) Labs Reviewed  COMPREHENSIVE METABOLIC PANEL WITH GFR - Abnormal; Notable for the following components:      Result Value   Sodium 130 (*)    CO2 15 (*)    Glucose, Bld 180 (*)    BUN 22 (*)    Calcium  8.3 (*)    Total Protein 6.4 (*)    Albumin 2.4 (*)    All other components within normal limits  CBC WITH DIFFERENTIAL/PLATELET - Abnormal; Notable for the following components:   WBC 10.7 (*)    RDW 18.4 (*)    Neutro Abs 7.8 (*)    All other components within normal limits  VALPROIC ACID  LEVEL - Abnormal; Notable for the following components:   Valproic Acid  Lvl 29 (*)    All other components within normal limits  I-STAT CG4 LACTIC ACID, ED - Abnormal; Notable for the following components:   Lactic Acid, Venous 2.8 (*)    All other components within normal limits  CBG MONITORING, ED - Abnormal; Notable for the following components:   Glucose-Capillary 173 (*)    All other components within normal limits  RESP PANEL BY RT-PCR (RSV, FLU A&B, COVID)  RVPGX2  CULTURE, BLOOD (ROUTINE X 2)  CULTURE, BLOOD (ROUTINE X 2)  RESP PANEL BY RT-PCR (RSV, FLU A&B, COVID)  RVPGX2  PROTIME-INR  HCG, SERUM, QUALITATIVE   URINALYSIS, W/ REFLEX TO CULTURE (INFECTION SUSPECTED)  I-STAT CG4 LACTIC ACID, ED    EKG: EKG Interpretation Date/Time:  Sunday June 11 2024 10:01:39 EDT Ventricular Rate:  117 PR Interval:  114 QRS Duration:  90 QT Interval:  327 QTC Calculation: 457 R Axis:   88  Text Interpretation: Sinus tachycardia Borderline repolarization abnormality No significant change since last tracing Confirmed by Doretha Folks ([id number]) on 06/11/2024 10:06:01 AM  Radiology: [facility] Chest Port 1 View Result Date: 06/11/2024 CLINICAL DATA:  Sepsis. EXAM: PORTABLE CHEST 1 VIEW COMPARISON:  05/23/2024 FINDINGS: Asymmetric elevation right hemidiaphragm. The lungs are clear without focal pneumonia, edema, pneumothorax or pleural effusion. The cardiopericardial silhouette is within normal limits for size. No acute bony abnormality. IMPRESSION: No active disease. Electronically Signed   By: Camellia Candle M.D.   On: 06/11/2024 10:25     Procedures   Medications Ordered in the ED  lactated ringers  infusion (has no administration in time range)  acetaminophen  (TYLENOL ) 650 MG suppository (  Given 06/11/24 1003)  lactated ringers  bolus 1,000 mL (0 mLs Intravenous Stopped 06/11/24 1137)  cefTRIAXone (ROCEPHIN) 2 g in sodium chloride  0.9 % 100 mL IVPB (0 g Intravenous Stopped 06/11/24 1117)  azithromycin (ZITHROMAX) 500 mg in sodium chloride  0.9 % 250 mL IVPB (0 mg Intravenous Stopped 06/11/24 1153)                                    Medical Decision Making Amount and/or Complexity of Data Reviewed Independent Historian: EMS External Data Reviewed: notes. Labs: ordered. Decision-making details documented in ED Course. Radiology: ordered and independent interpretation performed. Decision-making details documented in ED Course. ECG/medicine tests: ordered and independent interpretation performed. Decision-making details documented in ED Course.  Risk Prescription drug management. Decision regarding  hospitalization.  Pt with multiple medical problems and comorbidities and presenting today with a complaint that caries a high risk for morbidity and mortality.  Here today with reported seizure-like activity at the rehab facility with a history of epilepsy but also noted to have a temperature of 105.5 rectally here.  Concern for sepsis which would have lowered her seizure threshold.  No report of any falls or injuries since arriving at the rehab facility.  Patient during most recent admission after a fall had no evidence of brain injury or bleed at that time and lower suspicion for that today.  Patient has a very coarse cough and is requiring more oxygen than her baseline of 2 with sats at 94%.  Concern for pneumonia and sepsis order set was initiated.  Blood pressure is currently stable and she was given a bolus of fluid and rectal Tylenol .  She was covered with Rocephin and azithromycin with presumed source being her lungs.  She would be community-acquired as she did not receive any IV antibiotics while in the hospital and when hospitalized for approximately 7 days.  This was confirmed with the pharmacist.  12:53 PM I independently interpreted patient's labs and EKG.  EKG with a sinus tachycardia but no other acute findings, COVID panel is negative, CMP with hyponatremia of 130 normal creatinine and anion gap, lactate is elevated to 2.8 but may also be related to seizure activity.  CBC with mild leukocytosis of 10.7 and urine is pending.  Depakote  level is subtherapeutic at 29. I have independently visualized and interpreted pt's images today.  Chest x-ray without any obvious infiltrate.  On repeat evaluation currently patient is now awake and she is able to answer some questions.  She does admit to having some shortness of breath and having a new cough recently.  At this time feel that patient needs admission.  Blood pressure has remained stable.  Pt will need admission for IV abx.  At this time because  pt is now more awake and seems more baseline lower suspicion for meningitis or intracranial pathology and felt pt was post-ictal.  Will consult unassigned for admission.  CRITICAL CARE Performed by: Stephany Poorman Total critical care time: 30 minutes Critical care time was exclusive of separately billable procedures and treating other patients. Critical care was necessary to treat or prevent imminent or life-threatening deterioration. Critical care was time spent personally by me on the following activities: development of treatment plan with patient and/or surrogate as well as nursing, discussions with consultants, evaluation of patient's response to treatment, examination of patient, obtaining history from patient or surrogate, ordering and performing treatments and interventions, ordering and review of laboratory studies, ordering and review of radiographic studies, pulse oximetry and re-evaluation of patient's condition.        Final diagnoses:  Sepsis without acute organ dysfunction, due to unspecified organism [facility])  Seizure disorder (HCC)  Seizure secondary to subtherapeutic anticonvulsant medication [facility])    ED Discharge Orders     None          Doretha Folks, MD 06/11/24 1158    Doretha Folks, MD 06/11/24 1254

## 2024-06-11 NOTE — Progress Notes (Signed)
 Pharmacy Antibiotic Note  Alexandria Harvey is a 43 y.o. female admitted on 06/11/2024 with concern for sepsis.  Pharmacy has been consulted for vancomycin dosing.  Plan: Vancomycin 1500 mg IV x 1, then 750 mg IV q 24h (eAUC 502) Monitor renal function, Cx and clinical progression to narrow Vancomycin levels as indicated     Temp (24hrs), Avg:101.3 F (38.5 C), Min:99 F (37.2 C), Max:105.6 F (40.9 C)  Recent Labs  Lab 06/11/24 0954 06/11/24 1033 06/11/24 1644  WBC 10.7*  --   --   CREATININE 0.77  --   --   LATICACIDVEN  --  2.8* 2.2*    CrCl cannot be calculated (Unknown ideal weight.).    Allergies  Allergen Reactions   Haloperidol Lactate Other (See Comments)    Unknown reaction   Shrimp [Shellfish Allergy] Hives and Rash   Strawberry (Diagnostic) Rash    Dorn Poot, PharmD, [facility] Clinical Pharmacist ED Pharmacist Phone # [phone] 06/11/2024 6:17 PM

## 2024-06-11 NOTE — H&P (Cosign Needed Addendum)
 Date: 06/11/2024               Patient Name:  Alexandria Harvey MRN: [id number]  DOB: May 06, 1981 Age / Sex: 43 y.o., female   PCP: [facility]         Medical Service: Internal Medicine Teaching Service         Attending Physician: Dr. Reyes Fenton      First Contact: Dr. Lamonte Colace, DO    Second Contact: Dr. Norman Lobstein, DO         Pager Information: First Contact Pager: [phone]   Second Contact Pager: [phone]   SUBJECTIVE   Chief Complaint: Seizures and Fever  History of Present Illness: Alexandria Harvey is a 43 y.o. female with PMH of epilepsy, HTN, mild intellectual disability, T2DM, recent fall with C3,C5, and C7 fractures with spinal cord injury in CTO brace, and schizophrenia who presented to ED via EMS for suspected febrile seizures. Patient lives at [facility], where she was found this morning with a fever of 103F and seizure-like activity with jerking motions to the right side of her body. She desaturated to 88% on her baseline 2L [place], so facility staff uptitrated oxygen to 4L where she resaturated appropriately. Patient was unresponsive and EMS was called. Per staff, patient is normally A&Ox4 and verbal at baseline. Staff was unable to tell EMS patient's normal postictal time. Upon arrival, EMS found patient to have a temperature of 104.450F and GCS of 5.  Patient was hot to the touch. Repeat temperature showed 97.79F but patient would not respond verbally.  On arrival to ED, fever increased to 105.5.  IMTS unable to evaluate patient due to patient's inability to communicate. Called [facility] to obtain more information. Spoke to patient's caregiver, Odella, who informed me that the patient was conversing in full sentences as recently as this morning and last night.  At baseline, patient is able to have full conversations, independently dress and bathe herself (with prompting), feed herself, and follow  commands.  Prior to her most recent fall, she was able to tell you what she wanted/did not want.   Per [facility], patient felt warm to the touch 10/4 and was swabbed for COVID, which was negative. Patient had mild temperature yesterday (unspecified degree), was more tired/less responsive than usual, but still had oriented answers.  Odella states patient is on baseline 2L [place], and has recently developed new coarse cough that is associated with swallowing.  Patient has had difficulty swallowing her pills, and [name] notes the patient's medications have had to be crushed into applesauce to be given --including her Depakote . She also believes the patient is becoming more sleepy during the day than usual. Patient has recently endorsed itching in the vagina since being admitted to [facility]. She's recently been placed in [place] due to her inability to use the bathroom by herself after her spinal cord injury.  Previously was able to shower daily by herself.  Has been found multiple times with soiled [place]amp[place] by both [facility] and the patient's roommate, as the patient cannot push call button for help recently. Additionally, the patient recently developed a sacral bedsore from being in the prone position after her accident as to not aggravate her C-spine.    ED Course: Labs significant for COVID/flu neg, 105.450F temperature rectally, lactic acid 2.8--2.2 , WBC 10.7 with left shift to 7.8.  Imaging:  CXR showed asymmetric elevation of right hemidiaphragm. Lungs are  clear without focal pneumonia, edema, pneumothorax, or pleural effusion.   Received LR 1L bolus, tylenol  650mg  suppository, 1g IV CTX and IV Azithromycin.   Meds:  Patient unable to provide history. Review medication list from dispense history and chart review. Atenolol  50 mg tablet, taking once daily.  Held at recent discharge 9/18. Atorvastatin  80 mg tablet. Benztropine  1 mg tablet, 1 tablet twice daily Oyster shell calcium  vitamin D3 tablet,  twice daily Clozapine  100 mg tablet, twice daily Depakote  500 mg tablet, 1 tablet each morning and 2 tablets every afternoon Zetia 10 mg tablet daily Ferrous sulfate  daily Lisinopril  2.5 mg tablet daily.  Held at recent discharge 9/18 Lorazepam  0.5 mg tablet daily.  Held at recent discharge 9/18 Metformin  500 mg tablet, 2 tablets twice daily Omeprazole 20 mg tablet, twice daily MiraLAX  Simethicone  80 mg chewable tablet, twice daily  Depo-Provera injections, most recently 04/28/2024. Has been on for years.  Current Meds  Medication Sig   acetaminophen  (TYLENOL ) 500 MG tablet Take 2 tablets (1,000 mg total) by mouth every 8 (eight) hours as needed.   atorvastatin  (LIPITOR ) 80 MG tablet Take 1 tablet (80 mg total) by mouth at bedtime.   benztropine  (COGENTIN ) 1 MG tablet Take 1 mg by mouth 2 (two) times daily.   Calcium  Carb-Cholecalciferol (OYSTER SHELL CALCIUM  W/D) 500-5 MG-MCG TABS Take 1 tablet by mouth 2 (two) times daily.   cloZAPine  (CLOZARIL ) 100 MG tablet Take 100 mg by mouth 2 (two) times daily.    divalproex  (DEPAKOTE ) 500 MG DR tablet Take 1 tablet (500 mg total) by mouth 3 (three) times daily.   ezetimibe (ZETIA) 10 MG tablet Take 10 mg by mouth daily.   ferrous sulfate  325 (65 FE) MG tablet Take 1 tablet (325 mg total) by mouth daily with breakfast.   gabapentin  (NEURONTIN ) 300 MG capsule Take 1 capsule (300 mg total) by mouth 3 (three) times daily.   lubiprostone  (AMITIZA ) 24 MCG capsule Take 1 capsule (24 mcg total) by mouth daily with breakfast.   metFORMIN  (GLUCOPHAGE ) 500 MG tablet Take 1,000 mg by mouth 2 (two) times daily with a meal.   methocarbamol  (ROBAXIN ) 500 MG tablet Take 2 tablets (1,000 mg total) by mouth every 8 (eight) hours as needed for muscle spasms.   omeprazole (PRILOSEC) 20 MG capsule Take 20 mg by mouth in the morning and at bedtime.   oxyCODONE  (OXY IR/ROXICODONE ) 5 MG immediate release tablet Take 0.5-1 tablets (2.5-5 mg total) by mouth every 6 (six)  hours as needed for breakthrough pain (2.5 mg for moderate, 5 mg for severe). (Patient taking differently: Take 5 mg by mouth every 6 (six) hours as needed for breakthrough pain (2.5 mg for moderate, 5 mg for severe).)   polyethylene glycol (MIRALAX  / GLYCOLAX ) packet Take 17 g by mouth 2 (two) times daily.   simethicone  (MYLICON) 80 MG chewable tablet Chew 80 mg by mouth 2 (two) times daily.   Vitamin D , Ergocalciferol , (DRISDOL ) 1.25 MG (50000 UNIT) CAPS capsule Take 1 capsule (50,000 Units total) by mouth once a week. (Patient taking differently: Take 50,000 Units by mouth once a week. Saturdays)   zinc oxide (BALMEX) 11.3 % CREA cream Apply 1 Application topically as needed.    Past Medical History Epilepsy Type 2 Diabetes Mellitus Hypertension Mild Intellectual Disability Schizophrenia Hyperlipidemia  Past Surgical History Past Surgical History:  Procedure Laterality Date   NO PAST SURGERIES       Social:  Lives With roommate at [facility]  Occupation: none Support: legal guardian, Nat. Caregiver [name]. Mother, [place].  Level of Function: dependent ADLs and IADLs PCP:  [facility]  Substances: -Tobacco: denies -Alcohol: denies -Recreational Drug: denies  Family History:  Family History  Problem Relation Age of Onset   Diabetes Mellitus II Mother    Cancer Father      Allergies: Allergies as of 06/11/2024 - Review Complete 06/11/2024  Allergen Reaction Noted   Haloperidol lactate Other (See Comments) 01/08/2007   Shrimp [shellfish allergy] Hives and Rash 09/29/2014   Strawberry (diagnostic) Rash 10/24/2019    Review of Systems: A complete ROS was negative except as per HPI.   OBJECTIVE:   Physical Exam: Blood pressure 106/75, pulse 99, temperature 99 F (37.2 C), temperature source Axillary, resp. rate (!) 25, SpO2 99%.  Constitutional: age-appropriate appearing female laying in bed, in no acute  distress HENT: Recent laceration scar to right forehead approx.7-8cm, healing appropriately. Staples present in scalp. Patient placed in CTO brace for C-spine stabilization. Mucous membranes dry. Eyes: conjunctiva non-erythematous Cardiovascular: tachycardia, regular rhythm, no m/r/g Pulmonary/Chest: normal work of breathing on room air on 2L [place], slight rhonchi heard on anterior auscultation. Abdominal: soft, non-tender, non-distended MSK: no edema Neurological: Lethargic, unable to orient. Spontaneously will open eyes. Does not follow commands.  Skin: warm and dry. Stage 3 sacral wound, minimal erythema, ecchymosis without purulence, induration, or fluctuance. See below.  Psych: unable to assess, anxious and would squint eyes in crying fashion when asked orienting questions     Labs: CBC    Component Value Date/Time   WBC 10.7 (H) 06/11/2024 0954   RBC 4.95 06/11/2024 0954   HGB 13.7 06/11/2024 0954   HGB 12.0 06/15/2019 1307   HCT 42.8 06/11/2024 0954   HCT 37.1 06/15/2019 1307   PLT 198 06/11/2024 0954   PLT 201 06/15/2019 1307   MCV 86.5 06/11/2024 0954   MCV 92 06/15/2019 1307   MCH 27.7 06/11/2024 0954   MCHC 32.0 06/11/2024 0954   RDW 18.4 (H) 06/11/2024 0954   RDW 15.1 06/15/2019 1307   LYMPHSABS 1.9 06/11/2024 0954   LYMPHSABS 3.8 (H) 06/15/2019 1307   MONOABS 0.9 06/11/2024 0954   EOSABS 0.0 06/11/2024 0954   EOSABS 0.0 06/15/2019 1307   BASOSABS 0.0 06/11/2024 0954   BASOSABS 0.0 06/15/2019 1307     CMP     Component Value Date/Time   NA 130 (L) 06/11/2024 0954   NA 140 06/15/2019 1307   K 4.6 06/11/2024 0954   CL 100 06/11/2024 0954   CO2 15 (L) 06/11/2024 0954   GLUCOSE 180 (H) 06/11/2024 0954   BUN 22 (H) 06/11/2024 0954   BUN 15 06/15/2019 1307   CREATININE 0.77 06/11/2024 0954   CALCIUM  8.3 (L) 06/11/2024 0954   PROT 6.4 (L) 06/11/2024 0954   PROT 6.5 06/15/2019 1307   ALBUMIN 2.4 (L) 06/11/2024 0954   ALBUMIN 3.5 (L) 06/15/2019 1307   AST 30  06/11/2024 0954   ALT 18 06/11/2024 0954   ALKPHOS 53 06/11/2024 0954   BILITOT 0.9 06/11/2024 0954   BILITOT 0.3 06/15/2019 1307   GFRNONAA >60 06/11/2024 0954   GFRAA >60 09/14/2019 0320    Imaging: CT HEAD WO CONTRAST ( ) Result Date: 06/11/2024 CLINICAL DATA:  Provided history: Mental status change, unknown cause Acute Encephalopathy EXAM: CT HEAD WITHOUT CONTRAST TECHNIQUE: Contiguous axial images were obtained from the base of the skull through the vertex without intravenous contrast. RADIATION DOSE REDUCTION: This exam  was performed according to the departmental dose-optimization program which includes automated exposure control, adjustment of the mA and/or kV according to patient size and/or use of iterative reconstruction technique. COMPARISON:  Most recent head CT 05/23/2024 FINDINGS: Brain: No evidence of acute infarction, hemorrhage, extra-axial collection or mass lesion/mass effect. Unchanged atrophy, advanced for age, with stable periventricular white matter changes. Vascular: No hyperdense vessel or unexpected calcification. Skull: No fracture or focal lesion.  Frontal hyperostosis. Sinuses/Orbits: No acute finding. Other: Diminishing right frontal scalp hematoma. Overlying skin staples in place IMPRESSION: 1. No acute intracranial abnormality. 2. Unchanged atrophy and chronic small vessel ischemia. 3. Diminishing right frontal scalp hematoma. Electronically Signed   By: Andrea Gasman M.D.   On: 06/11/2024 19:37   DG Chest Port 1 View Result Date: 06/11/2024 CLINICAL DATA:  Sepsis. EXAM: PORTABLE CHEST 1 VIEW COMPARISON:  05/23/2024 FINDINGS: Asymmetric elevation right hemidiaphragm. The lungs are clear without focal pneumonia, edema, pneumothorax or pleural effusion. The cardiopericardial silhouette is within normal limits for size. No acute bony abnormality. IMPRESSION: No active disease. Electronically Signed   By: Camellia Candle M.D.   On: 06/11/2024 10:25     EKG: personally  reviewed my interpretation is sinus tachycardia. Prior EKG 09/2019 shows sinus rhythm.  ASSESSMENT & PLAN:   Assessment & Plan by Problem: Principal Problem:   Encephalopathy   Alexandria Harvey is a 43 y.o. person living with a PMH of epilepsy, HTN, mild intellectual disability, T2DM, schizophrenia who presented to ED via EMS for seizure and fevers over 103 and admitted for encephalopathy on hospital day 0.  Acute Toxic Metabolic Encephalopathy 2/2 Sepsis vs Febrile Seizures vs SCI Leukocytosis Presented to ED with AMS that began the morning of 10/5. Mild leukocytosis 10.7 with left shift. Patient received  IV CTX 2g  and Azithromycin 500mg  in ED. Alternating temperatures between 99-105.5, but sustaining temperatures of 19F since 12PM. COVID/flu negative. Lactic acid elevated to 2.8, then 2.2. Will continue to trend. Patient received 1L LR bolus in ED, however was not started on continuous LR infusion. Was rebolused with 1L LR. Blood cultures drawn, pending. CXR without obvious infiltrate but shows asymmetric elevation right hemidiaphragm. Likely secondary to central cord injury sustained 9/16. Pending UA and RPP. Etiology for encephalopathy unclear due to patient's inability to give history, but likely multifactorial in the setting of sepsis secondary to sacral ulcer/possible UTI, history of seizures lowering sepsis threshold, and structural injury with central cord injury. Valproic acid  level subtherapeutic, potentially due to crushing of medication in facility. Cannot rule out meningitis, however patient has sacral wound and cannot get LP. Additionally, with CTO brace, difficult to assess for other meningeal signs. Curbsided neurology, recommended loading dose of Depakote  1000mg , then 500mg  TID starting at 0300.  -- Will place patient on broad spectrum IV Vancomycin and Zosyn per pharmacy -- Consider starting IV CTX 2g the morning of 10/6 to cover for meningitis, as LP cannot be done due to sacral  ulcer -- Will follow morning CMP, CBC with diff, Valproic acid  level -- Pending CT Head wo contrast -- Pending ammonia, Magnesium , Phosphorus, TSH, UDS, serum osm, urine osm, and urine sodium  -- Continuous pulse ox   Sacral Wound POA Per EDP, Stage 3 sacral wound present without purulence, induration, or fluctuance. Minimal skin breakdown. Wound care consulted. Air mattress added. Will add barrier emollient.  -Turn patient q2hr  Epilepsy Valproic acid  level subtherapeutic at 29. On home Depakote  500 TID. Unknown last dose. Per caregiver, Depakote  has  recently been crushed due to patient's inability to swallow pills well given spinal cord injury. This can reduce effectiveness of medication, and may be contributing to patient's seizure on presentation, as well as encephalopathy. Last TSH checked 9 years ago. - Continue Depakote  500mg  TID  - Check TSH   C-Spine Fractures Central Spinal Cord Injury  Patient had recent fall on 9/16 and sustained laceration to right forehead as well as fractures to C3, C5, and C7 posterior element fractures with central cord syndrome. NSGY (Dr. Lanis) evaluated patient during her 9/16-9/18 admission and medically managed with CTO brace.   Hyponatremia Na 130 on admission. Has had previous, transient history, but recurrent in past month. Baseline 138-141. Potentially due to acute spinal cord injury and autonomic dysfunction, however could also be secondary to SIADH. Will continue to monitor and assess urine/serum osmol and urine sodium. - CMP   Hypertension BP low-normal on admission. Previously on Atenolol  and Lisinopril , both medications held on 9/22 due to normotensive readings. Hold home antihypertensives.  T2DM Last A1c 7.0 05/23/2024. Patient on Metformin  1000mg  BID. Patient allergic to Novolog  insulin , will hold SSI. Given encephalopathy, will hold Metformin .  -CBG  Hyponatremia Na 130 on admission. Has had previous, transient history of this,  recurrent in past month. Baseline 138-141. Potentially due to acute spinal cord injury and autonomic dysfunction.   Schizophrenia On home Clozapine  100mg . Lorazepam  0.5mg  held on last discharge. Will hold medications in setting of encephalopathy.   Hyperlipidemia On home Atorvastatin  80mg , Zetia 10mg . Will hold medications in setting of encephalopathy.  Best practice: Diet: NPO VTE: SCDs IVF: LR,150cc/hr x 20hours Code: Full  Disposition planning: Prior to Admission Living Arrangement: SNF, living at [facility].  Anticipated Discharge Location: SNF  Dispo: Admit patient to Observation with expected length of stay less than 2 midnights.  Signed: Ilianna Bown, DO Internal Medicine Resident  06/11/2024, 7:47 PM  On Call pager: [phone]

## 2024-06-11 NOTE — Hospital Course (Addendum)
 Alexandria Harvey is a 43 y.o. person living with a PMH of epilepsy, HTN, mild intellectual disability, T2DM, schizophrenia who presented to ED via EMS for seizure and fevers over 103 and admitted for encephalopathy on hospital day 0.   Acute Toxic Metabolic Encephalopathy 2/2 Sepsis vs Febrile Seizures vs SCI Leukocytosis Presented to ED with AMS that began the morning of 10/5. Initial mild leukocytosis 10.7 that resolved. Patient received  IV CTX 2g  and Azithromycin 500mg  in ED. Presented with alternating temperatures between 99-105.5, but remained afebrile through hospitalization. COVID/flu negative. Lactic acid peaked to 3.5, down to 2.6. Patient received one 1L LR bolus in the ED, however was not started on continuous LR infusion. Was rebolused with 1L LR. Blood cultures drawn and eventually grew Staph capitis and hominis, indicating contamination. Repeat blood cultures were negative for any growth. CXR without obvious infiltrate but shows asymmetric elevation right hemidiaphragm. Likely secondary to central cord injury sustained 9/16. Ammonia, Mag, Phosphorus, UA, UDS, and RPP negative. Etiology for encephalopathy  initially unclear due to patient's inability to give history, but likely multifactorial in the setting of sepsis secondary to sacral ulcer/possible UTI, history of seizures lowering sepsis threshold, and structural injury with central cord injury. Valproic acid  level subtherapeutic, potentially due to crushing of medication in facility. Could not rule out meningitis, however patient has sacral wound and could not get LP. Additionally, with CTO brace, difficult to assess for other meningeal signs. Placed on broad spectrum IV Vancomycin and Zosyn per pharmacy, as well as IV CTX 2g the morning of 10/6 to cover for meningitis. CT head shows unchanged atrophy with no acute intracranial abnormality. Chronic small vessel ischemia. Diminishing right front scalp hematoma. Curbsided neurology,  recommended loading dose of Depakote  1000mg , then 500mg  TID. On further evaluation, patient became A&Ox2 to person and birthday, but not place or year. Able to identify common objects like a pen/watch. Given acute improvement of encephalopathy, deescalated antibiotics coverage to IV Vancomycin for 3 days before discontinuing. Etiology more likely due to subtherapeutic Valproic acid  level potentially due to crushing of medication in facility but improved with IV Depakote . Patient would benefit from neurology outpatient follow up. Discharged on Depakote  750mg  TID.    Sacral Wound POA Per EDP, Stage 3 sacral wound present without purulence, induration, or fluctuance. Minimal skin breakdown. Wound care consulted. Air mattress and barrier emollient added. Placed orders to turn patient q2hr. Consulted RD and SLP, monitored albumin.  SLP recommended regular food diet and to swallow whole pills as able. Albumin remained low at 1.8-2.0, encouraged PO intake and added Ensure drinks to supplement protein.     Epilepsy Valproic acid  level subtherapeutic at 29 on presentation. On home Depakote  500 TID. Unknown last dose prior to admission. Improved with IV Depakote  500mg  TID. Per caregiver, Depakote  has recently been crushed due to patient's inability to swallow pills well given spinal cord injury. This can reduce effectiveness of medication, and may be contributing to patient's seizure on presentation, as well as encephalopathy. Last TSH checked 9 years ago, TSH WNL during admission.    C-Spine Fractures Central Spinal Cord Injury  Patient had recent fall on 9/16 and sustained laceration to right forehead as well as fractures to C3, C5, and C7 posterior element fractures with central cord syndrome. NSGY (Dr. Lanis) evaluated patient during her 9/16-9/18 admission and medically managed with CTO brace. Removed staples while inpatient.    Hyponatremia Na 130 on admission. Has had previous, transient history,  but recurrent in  past month. Baseline 138-141. Potentially due to acute spinal cord injury and autonomic dysfunction, however could also be secondary to SIADH. Continued to monitor and assessed urine/serum osmol and urine sodium, which was within normal limits.    Hypertension BP low-normal on admission. Previously on Atenolol  and Lisinopril , both medications held on 9/22 due to normotensive readings. Held home antihypertensives.   T2DM Last A1c 7.0 05/23/2024. Patient on Metformin  1000mg  BID. Patient allergic to Novolog  insulin , will hold SSI. Given encephalopathy, held home Metformin . BG remained at goal until day of discharge.    Hypokalemia, resolved K this morning 3.4. Replenished with KCL 40mEq powder. Improved to 3.6. Continued to monitor.    Schizophrenia On home Clozapine  100mg . Lorazepam  0.5mg  held on last discharge. Held Clozapine  100mg  in setting of encephalopathy until patient improved, then restarted.  CK WNL.    Hyperlipidemia On home Atorvastatin  80mg , Zetia 10mg . Held medications in setting of encephalopathy until patient improved, then restarted.

## 2024-06-11 NOTE — Sepsis Progress Note (Signed)
 Elink following code sepsis

## 2024-06-11 NOTE — ED Triage Notes (Signed)
 Pt BIB [facility] from a nursing facility. Per staff, they think the pt had a seizure. Pt's temp is 105. Baseline is A&O X4.

## 2024-06-12 DIAGNOSIS — G40919 Epilepsy, unspecified, intractable, without status epilepticus: Secondary | ICD-10-CM | POA: Diagnosis not present

## 2024-06-12 DIAGNOSIS — G928 Other toxic encephalopathy: Secondary | ICD-10-CM | POA: Diagnosis not present

## 2024-06-12 DIAGNOSIS — L89153 Pressure ulcer of sacral region, stage 3: Secondary | ICD-10-CM | POA: Diagnosis not present

## 2024-06-12 DIAGNOSIS — D72829 Elevated white blood cell count, unspecified: Secondary | ICD-10-CM | POA: Diagnosis not present

## 2024-06-12 LAB — CBC WITH DIFFERENTIAL/PLATELET
Abs Immature Granulocytes: 0.03 K/uL (ref 0.00–0.07)
Basophils Absolute: 0 K/uL (ref 0.0–0.1)
Basophils Relative: 0 %
Eosinophils Absolute: 0 K/uL (ref 0.0–0.5)
Eosinophils Relative: 0 %
HCT: 38.5 % (ref 36.0–46.0)
Hemoglobin: 11.9 g/dL — ABNORMAL LOW (ref 12.0–15.0)
Immature Granulocytes: 0 %
Lymphocytes Relative: 27 %
Lymphs Abs: 2.8 K/uL (ref 0.7–4.0)
MCH: 28.1 pg (ref 26.0–34.0)
MCHC: 30.9 g/dL (ref 30.0–36.0)
MCV: 90.8 fL (ref 80.0–100.0)
Monocytes Absolute: 1.3 K/uL — ABNORMAL HIGH (ref 0.1–1.0)
Monocytes Relative: 13 %
Neutro Abs: 6.2 K/uL (ref 1.7–7.7)
Neutrophils Relative %: 60 %
Platelets: 116 K/uL — ABNORMAL LOW (ref 150–400)
RBC: 4.24 MIL/uL (ref 3.87–5.11)
RDW: 18.2 % — ABNORMAL HIGH (ref 11.5–15.5)
WBC: 10.3 K/uL (ref 4.0–10.5)
nRBC: 0 % (ref 0.0–0.2)

## 2024-06-12 LAB — URINALYSIS, W/ REFLEX TO CULTURE (INFECTION SUSPECTED)
Bacteria, UA: NONE SEEN
Bilirubin Urine: NEGATIVE
Glucose, UA: NEGATIVE mg/dL
Hgb urine dipstick: NEGATIVE
Ketones, ur: 5 mg/dL — AB
Leukocytes,Ua: NEGATIVE
Nitrite: NEGATIVE
Protein, ur: NEGATIVE mg/dL
Specific Gravity, Urine: 1.028 (ref 1.005–1.030)
pH: 5 (ref 5.0–8.0)

## 2024-06-12 LAB — BLOOD CULTURE ID PANEL (REFLEXED) - BCID2

## 2024-06-12 LAB — RAPID URINE DRUG SCREEN, HOSP PERFORMED
Amphetamines: NOT DETECTED
Barbiturates: NOT DETECTED
Benzodiazepines: NOT DETECTED
Cocaine: NOT DETECTED
Opiates: NOT DETECTED
Tetrahydrocannabinol: NOT DETECTED

## 2024-06-12 LAB — COMPREHENSIVE METABOLIC PANEL WITH GFR
ALT: 13 U/L (ref 0–44)
AST: 23 U/L (ref 15–41)
Albumin: 2 g/dL — ABNORMAL LOW (ref 3.5–5.0)
Alkaline Phosphatase: 46 U/L (ref 38–126)
Anion gap: 14 (ref 5–15)
BUN: 15 mg/dL (ref 6–20)
CO2: 16 mmol/L — ABNORMAL LOW (ref 22–32)
Calcium: 8 mg/dL — ABNORMAL LOW (ref 8.9–10.3)
Chloride: 103 mmol/L (ref 98–111)
Creatinine, Ser: 0.44 mg/dL (ref 0.44–1.00)
GFR, Estimated: >60 mL/min (ref >60)
Glucose, Bld: 103 mg/dL — ABNORMAL HIGH (ref 70–99)
Potassium: 3.6 mmol/L (ref 3.5–5.1)
Sodium: 133 mmol/L — ABNORMAL LOW (ref 135–145)
Total Bilirubin: 0.9 mg/dL (ref 0.0–1.2)
Total Protein: 5.7 g/dL — ABNORMAL LOW (ref 6.5–8.1)

## 2024-06-12 LAB — VALPROIC ACID LEVEL: Valproic Acid Lvl: 37 ug/mL — ABNORMAL LOW (ref 50–100)

## 2024-06-12 LAB — OSMOLALITY, URINE: Osmolality, Ur: 877 mosm/kg (ref 300–900)

## 2024-06-12 LAB — GLUCOSE, CAPILLARY
Glucose-Capillary: 116 mg/dL — ABNORMAL HIGH (ref 70–99)
Glucose-Capillary: 126 mg/dL — ABNORMAL HIGH (ref 70–99)
Glucose-Capillary: 128 mg/dL — ABNORMAL HIGH (ref 70–99)
Glucose-Capillary: 93 mg/dL (ref 70–99)
Glucose-Capillary: 96 mg/dL (ref 70–99)

## 2024-06-12 LAB — LACTIC ACID, PLASMA
Lactic Acid, Venous: 2.6 mmol/L (ref 0.5–1.9)
Lactic Acid, Venous: 1.5 mmol/L (ref 0.5–1.9)

## 2024-06-12 LAB — SODIUM, URINE, RANDOM: Sodium, Ur: <30 mmol/L

## 2024-06-12 MED ORDER — COLLAGENASE 250 UNIT/GM EX OINT
TOPICAL_OINTMENT | Freq: Every day | CUTANEOUS | Status: DC
  Filled 2024-06-12 (×2): qty 30

## 2024-06-12 MED ORDER — LACTATED RINGERS IV BOLUS
500.0000 mL | Freq: Once | INTRAVENOUS | Status: AC
  Administered 2024-06-12: 500 mL via INTRAVENOUS

## 2024-06-12 NOTE — Plan of Care (Signed)

## 2024-06-12 NOTE — Progress Notes (Addendum)
 HD#1 SUBJECTIVE:  Patient Summary: Alexandria Harvey is a 43 y.o. person living with a PMH of epilepsy, HTN, mild intellectual disability, T2DM, schizophrenia who presented to ED via EMS for seizure and fevers over 103 and admitted for acute encephalopathy, which is improving.   Overnight Events: Lactic acid at 3.5, was given LR 500cc bolus  Interim History: Patient sleeping comfortably in bed. Patient's parents, [place] and Ubaldo present at bedside. They state the patient was able to have conversation with them as of last night and this morning, and is more cognitively aware than she previously was yesterday. They provided more insight to her living arrangements at [facility].  OBJECTIVE:  Vital Signs: Vitals:   06/12/24 0006 06/12/24 0346 06/12/24 0746 06/12/24 1259  BP: 117/78 109/71 111/77 105/68  Pulse: 93 91 86 85  Resp: 18 18    Temp: 99.1 F (37.3 C) 98.5 F (36.9 C) 98.5 F (36.9 C)   TempSrc:      SpO2: 100% 100% 100% 100%   Supplemental O2: Nasal Cannula SpO2: 100 % O2 Flow Rate (L/min): 2 L/min  There were no vitals filed for this visit.  No intake or output data in the 24 hours ending 06/12/24 1415 Net IO Since Admission: No IO data has been entered for this period [06/12/24 1415]  Physical Exam: Physical Exam Constitutional:      Comments: Sleeping, no acute distress  HENT:     Head: Normocephalic.     Comments: Recent laceration scar to right forehead approx.7-8cm, healing appropriately. Staples present in scalp. Patient placed in CTO brace for C-spine stabilization.    Mouth/Throat:     Pharynx: No oropharyngeal exudate or posterior oropharyngeal erythema.  Cardiovascular:     Rate and Rhythm: Normal rate and regular rhythm.     Pulses: Normal pulses.     Heart sounds: Normal heart sounds.  Pulmonary:     Effort: Pulmonary effort is normal.     Breath sounds: Normal breath sounds.  Abdominal:     General: Bowel sounds are normal.      Palpations: Abdomen is soft.  Musculoskeletal:     Right lower leg: Edema present.     Left lower leg: Edema present.  Skin:    General: Skin is warm and dry.  Neurological:     Comments: No response to tactile,verbal stimuli  Psychiatric:     Comments: Patient asleep-did not assess    Patient Lines/Drains/Airways Status     Active Line/Drains/Airways     Name Placement date Placement time Site Days   Peripheral IV 06/11/24 18 G Anterior;Left Forearm 06/11/24  1038  Forearm  1   Peripheral IV 06/11/24 22 G Anterior;Proximal;Right Forearm 06/11/24  1038  Forearm  1   Wound 05/23/24 1600 Other (Comment) Face Right;Upper 05/23/24  1600  Face  20   Wound 06/11/24 Pressure Injury Sacrum Mid Unstageable - Full thickness tissue loss in which the base of the injury is covered by slough (yellow, tan, gray, green or brown) and/or eschar (tan, brown or black) in the wound bed. 06/11/24  --  Sacrum  1   Wound 06/11/24 Pressure Injury Ischial tuberosity Left Stage 2 -  Partial thickness loss of dermis presenting as a shallow open injury with a red, pink wound bed without slough. 06/11/24  --  Ischial tuberosity  1   Wound 06/11/24 Pressure Injury Ischial tuberosity Right Deep Tissue Pressure Injury - Purple or maroon localized area of discolored intact  skin or blood-filled blister due to damage of underlying soft tissue from pressure and/or shear. 06/11/24  --  Ischial tuberosity  1            Pertinent labs and imaging:     Latest Ref Rng & Units 06/12/2024    4:40 AM 06/11/2024    9:54 AM 05/29/2024    5:51 AM  CBC  WBC 4.0 - 10.5 K/uL 10.3  10.7  11.5   Hemoglobin 12.0 - 15.0 g/dL 88.0  86.2  9.9   Hematocrit 36.0 - 46.0 % 38.5  42.8  29.8   Platelets 150 - 400 K/uL 116  198  292        Latest Ref Rng & Units 06/12/2024    4:40 AM 06/11/2024    9:54 AM 05/25/2024    7:14 AM  CMP  Glucose 70 - 99 mg/dL 896  819  864   BUN 6 - 20 mg/dL 15  22  9    Creatinine 0.44 - 1.00 mg/dL 9.55   9.22  9.46   Sodium 135 - 145 mmol/L 133  130  136   Potassium 3.5 - 5.1 mmol/L 3.6  4.6  4.2   Chloride 98 - 111 mmol/L 103  100  105   CO2 22 - 32 mmol/L 16  15  20    Calcium  8.9 - 10.3 mg/dL 8.0  8.3  7.7   Total Protein 6.5 - 8.1 g/dL 5.7  6.4    Total Bilirubin 0.0 - 1.2 mg/dL 0.9  0.9    Alkaline Phos 38 - 126 U/L 46  53    AST 15 - 41 U/L 23  30    ALT 0 - 44 U/L 13  18      CT HEAD WO CONTRAST ( ) Result Date: 06/11/2024 CLINICAL DATA:  Provided history: Mental status change, unknown cause Acute Encephalopathy EXAM: CT HEAD WITHOUT CONTRAST TECHNIQUE: Contiguous axial images were obtained from the base of the skull through the vertex without intravenous contrast. RADIATION DOSE REDUCTION: This exam was performed according to the departmental dose-optimization program which includes automated exposure control, adjustment of the mA and/or kV according to patient size and/or use of iterative reconstruction technique. COMPARISON:  Most recent head CT 05/23/2024 FINDINGS: Brain: No evidence of acute infarction, hemorrhage, extra-axial collection or mass lesion/mass effect. Unchanged atrophy, advanced for age, with stable periventricular white matter changes. Vascular: No hyperdense vessel or unexpected calcification. Skull: No fracture or focal lesion.  Frontal hyperostosis. Sinuses/Orbits: No acute finding. Other: Diminishing right frontal scalp hematoma. Overlying skin staples in place IMPRESSION: 1. No acute intracranial abnormality. 2. Unchanged atrophy and chronic small vessel ischemia. 3. Diminishing right frontal scalp hematoma. Electronically Signed   By: Andrea Gasman M.D.   On: 06/11/2024 19:37    ASSESSMENT/PLAN:  Assessment: Principal Problem:   Encephalopathy  Alexandria Harvey is a 43 y.o. person living with a PMH of epilepsy, HTN, mild intellectual disability, T2DM, schizophrenia who presented to ED via EMS for seizure and fevers over 103 and admitted for acute  encephalopathy, which is improving.   Plan: Acute Toxic Metabolic Encephalopathy 2/2 Sepsis vs Febrile Seizures vs SCI Leukocytosis Leukocytosis improved to 10.3. Afebrile, will continue to monitor temperatures. Lactic acid peaked to 3.5, down to 2.6. Received additional 500cc bolus LR overnight. 2/4 blood cultures growing gram positive cocci in both aerobic and anaerobic bottles. Will repeat blood cultures. Ammonia, Mag, Phosphorus, UA, UDS, and RPP negative. Will deescalate antibiotic coverage to  IV Vancomycin. CT head shows unchanged atrophy with no acute intracranial abnormality. Chronic small vessel ischemia. Diminishing right front scalp hematoma. Etiology for encephalopathy likely multifactorial in the setting of sepsis secondary to sacral ulcer and history of seizures lowering sepsis threshold. Valproic acid  level subtherapeutic potentially due to crushing of medication in facility but improving with IV Depakote . -- Continue on IV Vancomycin (Day 2) per pharmacy -- Pending blood cultures -- Will follow morning CMP, CBC with diff -- Continuous pulse ox -- Would benefit from neurology outpatient follow up  Sacral Wound POA Per EDP, Stage 3 sacral wound present without purulence, induration, or fluctuance. Minimal skin breakdown. Wound care consulted. Air mattress added. Will add barrier emollient.  - Turn patient q2hr - RD consult - SLP consult - Monitor albumin   Epilepsy Valproic acid  level subtherapeutic at 29. On home Depakote  500 TID. Unknown last dose. Per caregiver, Depakote  has recently been crushed due to patient's inability to swallow pills well given spinal cord injury. This can reduce effectiveness of medication, and may be contributing to patient's seizure on presentation, as well as encephalopathy. TSH WNL.  - Continue Depakote  500mg  TID  - Monitor Valproic acid  level   C-Spine Fractures Central Spinal Cord Injury  Patient had recent fall on 9/16 and sustained laceration  to right forehead as well as fractures to C3, C5, and C7 posterior element fractures with central cord syndrome. NSGY (Dr. Lanis) evaluated patient during her 9/16-9/18 admission and medically managed with CTO brace.  -SLP eval -Patient should follow up with NSGY/ENT outpatient for suture removal   Hyponatremia Na 130 on admission. Has had previous, transient history, but recurrent in past month. Baseline 138-141. Potentially due to acute spinal cord injury and autonomic dysfunction, however could also be secondary to SIADH. Urine/serum osmol and urine sodium WNL. Will continue to monitor.  - CMP    Hypertension BP low-normal on admission. Previously on Atenolol  and Lisinopril , both medications held on 9/22 due to normotensive readings. Hold home antihypertensives.   T2DM Last A1c 7.0 05/23/2024. Patient on Metformin  1000mg  BID. Patient allergic to Novolog  insulin , will hold SSI. Given encephalopathy, will hold Metformin .  -CBG   Hyponatremia Na 130 on admission. Has had previous, transient history of this, recurrent in past month. Baseline 138-141. Potentially due to acute spinal cord injury and autonomic dysfunction.    Schizophrenia On home Clozapine  100mg . Lorazepam  0.5mg  held on last discharge. Will hold medications in setting of encephalopathy.  --Will check CK given patient's Clozapine  use to r/o neuroleptic malignant syndrome   Hyperlipidemia On home Atorvastatin  80mg , Zetia 10mg . Will hold medications in setting of encephalopathy  Best Practice: Diet: Regular diet IVF: Fluids: LR, Rate: 500 cc bolus VTE: Place and maintain sequential compression device Start: 06/11/24 1912 SCDs Start: 06/11/24 1556 Code: Full  Disposition planning: Therapy Recs: Rehab, DME: none Family Contact: Mother [place], caregiver [place] DISPO: Anticipated discharge in 1-2 days to Rehab pending IV antibiotics, Wound care, and Clinical Improvement.  Signature:  Travious Vanover, DO [facility]  Internal Medicine Residency  2:15 PM, 06/12/2024  On Call pager [phone]

## 2024-06-12 NOTE — Progress Notes (Signed)
 PHARMACY - PHYSICIAN COMMUNICATION CRITICAL VALUE ALERT - BLOOD CULTURE IDENTIFICATION (BCID)  Alexandria Harvey is an 43 y.o. female who presented to [facility] on 06/11/2024 with a chief complaint of seizures and fever  Assessment:   1/2 blood cultures growing Staphylococcus species  Name of physician (or Provider) Contacted:  Dr. Isobel  Current antibiotics:  Vancomycin and Zosyn   Changes to prescribed antibiotics recommended:  None at this time  Results for orders placed or performed during the hospital encounter of 06/11/24  Blood Culture ID Panel (Reflexed) (Collected: 06/11/2024  9:59 AM)  Result Value Ref Range   Enterococcus faecalis NOT DETECTED NOT DETECTED   Enterococcus Faecium NOT DETECTED NOT DETECTED   Listeria monocytogenes NOT DETECTED NOT DETECTED   Staphylococcus species DETECTED (A) NOT DETECTED   Staphylococcus aureus (BCID) NOT DETECTED NOT DETECTED   Staphylococcus epidermidis NOT DETECTED NOT DETECTED   Staphylococcus lugdunensis NOT DETECTED NOT DETECTED   Streptococcus species NOT DETECTED NOT DETECTED   Streptococcus agalactiae NOT DETECTED NOT DETECTED   Streptococcus pneumoniae NOT DETECTED NOT DETECTED   Streptococcus pyogenes NOT DETECTED NOT DETECTED   A.calcoaceticus-baumannii NOT DETECTED NOT DETECTED   Bacteroides fragilis NOT DETECTED NOT DETECTED   Enterobacterales NOT DETECTED NOT DETECTED   Enterobacter cloacae complex NOT DETECTED NOT DETECTED   Escherichia coli NOT DETECTED NOT DETECTED   Klebsiella aerogenes NOT DETECTED NOT DETECTED   Klebsiella oxytoca NOT DETECTED NOT DETECTED   Klebsiella pneumoniae NOT DETECTED NOT DETECTED   Proteus species NOT DETECTED NOT DETECTED   Salmonella species NOT DETECTED NOT DETECTED   Serratia marcescens NOT DETECTED NOT DETECTED   Haemophilus influenzae NOT DETECTED NOT DETECTED   Neisseria meningitidis NOT DETECTED NOT DETECTED   Pseudomonas aeruginosa NOT DETECTED NOT DETECTED    Stenotrophomonas maltophilia NOT DETECTED NOT DETECTED   Candida albicans NOT DETECTED NOT DETECTED   Candida auris NOT DETECTED NOT DETECTED   Candida glabrata NOT DETECTED NOT DETECTED   Candida krusei NOT DETECTED NOT DETECTED   Candida parapsilosis NOT DETECTED NOT DETECTED   Candida tropicalis NOT DETECTED NOT DETECTED   Cryptococcus neoformans/gattii NOT DETECTED NOT DETECTED    [name] 06/12/2024  5:50 AM

## 2024-06-12 NOTE — Evaluation (Signed)
 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 who presented from [facility] to ED with acute toxic metabolic encephalopathy 2/2 sepsis vs seizures vs SCI leukocytosis.  PMHx  recent fall with C3,C5, and C7 fractures with spinal cord injury in CTO brace, epilepsy, HTN, mild intellectual disability, T2DM, schizophrenia. Her mother reports that pt eats a regular diet at baseline, normally swallows pills whole.    Assessment / Plan / Recommendation  Clinical Impression  Pt presents with functional swallowing.  She was alert and participatory. Voice at baseline.  Unable to feed herself after cervical injury.  Accepted sips of water  from a straw, bites of applesauce and crackers.  Demonstrated adequate mastication.  There were multiple sub-swallows required in order to transition a single bolus of liquid or water  (this is new since 10/2 according to her mother). No s/s of aspiration over the course of multiple trials.   Recommend resuming a regular solid food diet (carb modified).  Swallow pills whole as able.  SLP will follow briefly to ensure efficiency of swallow is adequate; MBS may be valuable to assess physiology. D/W pt/parents who agree with plan.  SLP Visit Diagnosis: Dysphagia,  pharyngeal phase (R13.13)    Aspiration Risk  No limitations    Diet Recommendation   Age appropriate regular;Thin  Medication Administration: Whole meds with puree    Other  Recommendations Oral Care Recommendations: Oral care BID     Assistance Recommended at Discharge    Functional Status Assessment    Frequency and Duration min 2x/week  1 week       Prognosis Prognosis for improved oropharyngeal function: Good      Swallow Study   General Date of Onset: 06/12/24 HPI: Alexandria Harvey is a 43 y.o. who presented from [facility] to ED with acute toxic metabolic encephalopathy 2/2 sepsis vs seizures vs SCI leukocytosis.  PMHx  recent fall with C3,C5, and C7 fractures with spinal cord injury in CTO brace, epilepsy, HTN, mild intellectual disability, T2DM, schizophrenia. Her mother reports that pt eats a regular diet at baseline, normally swallows pills whole. Type of Study: Bedside Swallow Evaluation Previous Swallow Assessment: [name] 2021 normal findings Diet Prior to this Study: NPO Temperature Spikes Noted: No Respiratory Status: Nasal cannula History of Recent Intubation: No Behavior/Cognition: Alert;Cooperative;Pleasant mood Oral Cavity Assessment: Within Functional Limits Oral Care Completed by SLP: Recent completion by staff Oral Cavity - Dentition: Poor condition Self-Feeding Abilities: Needs assist Patient Positioning: Upright in bed Baseline Vocal Quality: Normal Volitional Cough: Strong Volitional Swallow: Able to elicit    Oral/Motor/Sensory Function Overall Oral Motor/Sensory Function: Within functional limits   Ice Chips Ice chips: Within functional limits   Thin Liquid Thin Liquid: Within functional  limits    Nectar Thick Nectar Thick Liquid: Not tested   Honey Thick Honey Thick Liquid: Not tested   Puree Puree: Within functional limits   Solid     Solid: Within functional limits      Vona Palma Laurice 06/12/2024,12:28 PM  Palma L.  Vona, MA CCC/SLP Clinical Specialist - Acute Care SLP Acute Rehabilitation Services Office number [phone]

## 2024-06-12 NOTE — Consult Note (Addendum)
 WOC Nurse Consult Note: Reason for Consult: pressure injuries  Wound type: 1.  Deep tissue Pressure Injury Sacrum that is evolving to unstageable with 60% black developing eschar 20% tan 20% red moist  2.  Stage 2 Pressure Injury L ischium pink moist, small serum filled blister purple maroon on medial edge  3.  Deep Tissue Pressure Injury R ischium purple maroon discoloration  Pressure Injury POA: Yes Measurement: see nursing flowsheet  Wound bed: as above  Drainage (amount, consistency, odor) see nursing flowsheet  Periwound: erythema  Dressing procedure/placement/frequency: Cleanse sacral wound with Vashe wound cleanser Soila [phone]) do not rinse and allow to air dry. Apply 1/4 thick layer of Santyl to wound bed, top with saline moist gauze, dry gauze and silicone foam or ABD pad whichever is preferred.  Cleanse B buttock/ischial wounds with Vashe, allow to air dry.  Apply Xeroform gauze ([place] [phone]) to wound beds daily and secure with silicone foam or ABD pad and tape whichever is preferred.   Patient should be placed on a low air loss mattress for pressure redistribution and moisture management.   POC discussed with bedside nurse.  Appreciate Y. Nazario, RN assistance with this consult   WOC team will not follow. Reconsult if further needs arise.   Thank you,    Powell Bar MSN, RN-BC, [employer]

## 2024-06-13 DIAGNOSIS — R569 Unspecified convulsions: Secondary | ICD-10-CM | POA: Diagnosis not present

## 2024-06-13 DIAGNOSIS — Z9181 History of falling: Secondary | ICD-10-CM

## 2024-06-13 DIAGNOSIS — S12601D Unspecified nondisplaced fracture of seventh cervical vertebra, subsequent encounter for fracture with routine healing: Secondary | ICD-10-CM

## 2024-06-13 DIAGNOSIS — Z79899 Other long term (current) drug therapy: Secondary | ICD-10-CM

## 2024-06-13 DIAGNOSIS — E876 Hypokalemia: Secondary | ICD-10-CM

## 2024-06-13 DIAGNOSIS — G9340 Encephalopathy, unspecified: Secondary | ICD-10-CM | POA: Diagnosis not present

## 2024-06-13 DIAGNOSIS — S14127D Central cord syndrome at C7 level of cervical spinal cord, subsequent encounter: Secondary | ICD-10-CM

## 2024-06-13 DIAGNOSIS — L89153 Pressure ulcer of sacral region, stage 3: Secondary | ICD-10-CM | POA: Diagnosis not present

## 2024-06-13 LAB — COMPREHENSIVE METABOLIC PANEL WITH GFR
ALT: 13 U/L (ref 0–44)
AST: 19 U/L (ref 15–41)
Albumin: 2 g/dL — ABNORMAL LOW (ref 3.5–5.0)
Alkaline Phosphatase: 46 U/L (ref 38–126)
Anion gap: 12 (ref 5–15)
BUN: 7 mg/dL (ref 6–20)
CO2: 18 mmol/L — ABNORMAL LOW (ref 22–32)
Calcium: 8.3 mg/dL — ABNORMAL LOW (ref 8.9–10.3)
Chloride: 103 mmol/L (ref 98–111)
Creatinine, Ser: 0.33 mg/dL — ABNORMAL LOW (ref 0.44–1.00)
GFR, Estimated: >60 mL/min (ref >60)
Glucose, Bld: 138 mg/dL — ABNORMAL HIGH (ref 70–99)
Potassium: 3.4 mmol/L — ABNORMAL LOW (ref 3.5–5.1)
Sodium: 133 mmol/L — ABNORMAL LOW (ref 135–145)
Total Bilirubin: 0.8 mg/dL (ref 0.0–1.2)
Total Protein: 5.6 g/dL — ABNORMAL LOW (ref 6.5–8.1)

## 2024-06-13 LAB — CBC WITH DIFFERENTIAL/PLATELET
Abs Immature Granulocytes: 0.05 K/uL (ref 0.00–0.07)
Basophils Absolute: 0 K/uL (ref 0.0–0.1)
Basophils Relative: 0 %
Eosinophils Absolute: 0 K/uL (ref 0.0–0.5)
Eosinophils Relative: 0 %
HCT: 32.9 % — ABNORMAL LOW (ref 36.0–46.0)
Hemoglobin: 10.4 g/dL — ABNORMAL LOW (ref 12.0–15.0)
Immature Granulocytes: 1 %
Lymphocytes Relative: 11 %
Lymphs Abs: 1 K/uL (ref 0.7–4.0)
MCH: 27.7 pg (ref 26.0–34.0)
MCHC: 31.6 g/dL (ref 30.0–36.0)
MCV: 87.5 fL (ref 80.0–100.0)
Monocytes Absolute: 0.7 K/uL (ref 0.1–1.0)
Monocytes Relative: 8 %
Neutro Abs: 7.2 K/uL (ref 1.7–7.7)
Neutrophils Relative %: 80 %
Platelets: 143 K/uL — ABNORMAL LOW (ref 150–400)
RBC: 3.76 MIL/uL — ABNORMAL LOW (ref 3.87–5.11)
RDW: 18 % — ABNORMAL HIGH (ref 11.5–15.5)
WBC: 9 K/uL (ref 4.0–10.5)
nRBC: 0 % (ref 0.0–0.2)

## 2024-06-13 LAB — GLUCOSE, CAPILLARY
Glucose-Capillary: 101 mg/dL — ABNORMAL HIGH (ref 70–99)
Glucose-Capillary: 139 mg/dL — ABNORMAL HIGH (ref 70–99)
Glucose-Capillary: 153 mg/dL — ABNORMAL HIGH (ref 70–99)
Glucose-Capillary: 157 mg/dL — ABNORMAL HIGH (ref 70–99)
Glucose-Capillary: 255 mg/dL — ABNORMAL HIGH (ref 70–99)
Glucose-Capillary: 278 mg/dL — ABNORMAL HIGH (ref 70–99)

## 2024-06-13 LAB — CK: Total CK: 107 U/L (ref 38–234)

## 2024-06-13 LAB — VALPROIC ACID LEVEL: Valproic Acid Lvl: 42 ug/mL — ABNORMAL LOW (ref 50–100)

## 2024-06-13 MED ORDER — ENOXAPARIN SODIUM 40 MG/0.4ML IJ SOSY
40.0000 mg | PREFILLED_SYRINGE | INTRAMUSCULAR | Status: DC
  Administered 2024-06-13 – 2024-06-14 (×2): 40 mg via SUBCUTANEOUS
  Filled 2024-06-13 (×2): qty 0.4

## 2024-06-13 MED ORDER — EZETIMIBE 10 MG PO TABS
10.0000 mg | ORAL_TABLET | Freq: Every day | ORAL | Status: DC
  Administered 2024-06-13 – 2024-06-14 (×2): 10 mg via ORAL
  Filled 2024-06-13 (×2): qty 1

## 2024-06-13 MED ORDER — POTASSIUM CHLORIDE 20 MEQ PO PACK
40.0000 meq | PACK | Freq: Once | ORAL | Status: AC
  Administered 2024-06-13: 40 meq via ORAL
  Filled 2024-06-13: qty 2

## 2024-06-13 MED ORDER — LACTATED RINGERS IV SOLN: INTRAVENOUS | Status: AC

## 2024-06-13 MED ORDER — CLOZAPINE 100 MG PO TABS
100.0000 mg | ORAL_TABLET | Freq: Two times a day (BID) | ORAL | Status: DC
  Administered 2024-06-13 – 2024-06-14 (×3): 100 mg via ORAL
  Filled 2024-06-13 (×3): qty 1

## 2024-06-13 MED ORDER — BENZTROPINE MESYLATE 1 MG PO TABS
1.0000 mg | ORAL_TABLET | Freq: Two times a day (BID) | ORAL | Status: DC
Start: 2024-06-13 — End: 2024-06-14
  Administered 2024-06-13 – 2024-06-14 (×3): 1 mg via ORAL
  Filled 2024-06-13 (×4): qty 1

## 2024-06-13 MED ORDER — ATORVASTATIN CALCIUM 80 MG PO TABS
80.0000 mg | ORAL_TABLET | Freq: Every day | ORAL | Status: DC
  Administered 2024-06-13: 80 mg via ORAL
  Filled 2024-06-13: qty 1

## 2024-06-13 MED ORDER — VANCOMYCIN HCL 750 MG/150ML IV SOLN
750.0000 mg | Freq: Two times a day (BID) | INTRAVENOUS | Status: DC
  Administered 2024-06-13 (×2): 750 mg via INTRAVENOUS
  Filled 2024-06-13 (×3): qty 150

## 2024-06-13 MED ORDER — JUVEN PO PACK
1.0000 | PACK | Freq: Two times a day (BID) | ORAL | Status: DC
  Administered 2024-06-14 (×2): 1 via ORAL
  Filled 2024-06-13 (×2): qty 1

## 2024-06-13 MED ORDER — ENSURE PLUS HIGH PROTEIN PO LIQD
237.0000 mL | Freq: Two times a day (BID) | ORAL | Status: DC
  Administered 2024-06-13 – 2024-06-14 (×4): 237 mL via ORAL

## 2024-06-13 NOTE — Plan of Care (Signed)

## 2024-06-13 NOTE — Plan of Care (Signed)

## 2024-06-13 NOTE — Progress Notes (Signed)
 Transition of Care [facility]) - Inpatient Brief Assessment   Patient Details  Name: Alexandria Harvey MRN: [id number] Date of Birth: 1981/03/27  [facility] CM/SW Contact:    Rosaline JONELLE Joe, RN Phone Number: 06/13/2024, 9:36 AM   Clinical Narrative: Patient admitted to the hospital from [facility] nursing facility with seizures, fever.  Patient currently receiving IV antibiotics.  No IP Care management needs at this time.  Patient will likely return to the Rehabilitation facility once medically stable.  IP Care management team will continue to follow the patient as patient progresses during hospitalization.   Transition of Care Asessment: Insurance and Status: (P) Insurance coverage has been reviewed Patient has primary care physician: (P) Yes Home environment has been reviewed: (P) from [facility] Prior level of function:: (P) skilled nursing home care Prior/Current Home Services: (P) No current home services Social Drivers of Health Review: (P) SDOH reviewed needs interventions Readmission risk has been reviewed: (P) Yes Transition of care needs: (P) no transition of care needs at this time

## 2024-06-13 NOTE — Progress Notes (Addendum)
 Pharmacy Antibiotic Note  Alexandria Harvey is a 43 y.o. female admitted on 06/11/2024 with concern for sepsis.  Pharmacy has been consulted for vancomycin dosing.  Plan: Change vancomycin dose to 750 mg IV q 12h (eAUC 438) Monitor renal function, Cx and clinical progression to narrow Vancomycin levels as indicated  Weight: 70.2 kg (154 lb 12.2 oz)  Temp (24hrs), Avg:97.9 F (36.6 C), Min:97.7 F (36.5 C), Max:98.1 F (36.7 C)  Recent Labs  Lab 06/11/24 0954 06/11/24 1033 06/11/24 1644 06/11/24 1929 06/11/24 2257 06/12/24 0440 06/12/24 1101 06/13/24 0431  WBC 10.7*  --   --   --   --  10.3  --  9.0  CREATININE 0.77  --   --   --   --  0.44  --  0.33*  LATICACIDVEN  --    < > 2.2* 2.3* 3.5* 2.6* 1.5  --    < > = values in this interval not displayed.    Estimated Creatinine Clearance: 86 mL/min (A) (by C-G formula based on SCr of 0.33 mg/dL (L)).    Allergies  Allergen Reactions   Haloperidol Lactate Other (See Comments)    Unknown reaction   Shrimp [Shellfish Allergy] Hives and Rash   Strawberry (Diagnostic) Rash    Recardo Purdue, Student-Pharmacist 06/13/2024 8:33 AM

## 2024-06-13 NOTE — Progress Notes (Signed)
 Initial Nutrition Assessment  DOCUMENTATION CODES:   Not applicable  INTERVENTION:  Continue carb modified diet, change to not room service appropriate  Ensure Plus High Protein po BID, each supplement provides 350 kcal and 20 grams of protein. -1 packet Juven BID, each packet provides 95 calories, 2.5 grams of protein (collagen), and 9.8 grams of carbohydrate (3 grams sugar); also contains 7 grams of L-arginine and L-glutamine, 300 mg vitamin C, 15 mg vitamin E, 1.2 mcg vitamin B-12, 9.5 mg zinc, 200 mg calcium , and 1.5 g  Calcium  Beta-hydroxy-Beta-methylbutyrate to support wound healing    NUTRITION DIAGNOSIS:   Increased nutrient needs related to wound healing as evidenced by  (Deep tissue and stage II pressure injuries).   GOAL:   Patient will meet greater than or equal to 90% of their needs   MONITOR:   PO intake, Skin, Supplement acceptance  REASON FOR ASSESSMENT:   Consult Poor PO, Wound healing (Sacral wound, stage 3, difficulties with eating recently given CTO brace and history of IDD)  ASSESSMENT:   PMH of epilepsy, HTN, mild intellectual disability, T2DM, schizophrenia who presented to ED via EMS for seizure and fevers over 103 and admitted for acute encephalopathy  Met with patient in room, parents at bedside. Pt reports good appetite, needs assistance eating with meals. Reports that she was eating 50-75% of meals at SNF, had not tried ensure drinks while there but open having them during admission. Parents unsure if she has had any recent weight changes, her caregiver told them she thinks patient has been gaining weight. DTPI and stage II PI documented by WOC, will also add juven BID. Seen by speech, cleared for regular consistency diet with think liquids.   Meds reviewed.  Labs: Sodium 133 Potassium 3.4  Glu 138  Calcium  8.3     Wt Readings from Last 10 Encounters:  06/13/24 70.2 kg  05/23/24 69.9 kg  03/28/20 69.9 kg  10/25/19 66.5 kg  09/16/19 66.6  kg  08/30/19 65.8 kg  06/15/19 64.6 kg  06/29/17 73.5 kg  02/13/15 80.6 kg  01/16/15 90.7 kg   NUTRITION - FOCUSED PHYSICAL EXAM:  Flowsheet Row Most Recent Value  Orbital Region No depletion  Upper Arm Region No depletion  Thoracic and Lumbar Region Unable to assess  [CTO brace]  Buccal Region No depletion  Temple Region No depletion  Clavicle Bone Region No depletion  Clavicle and Acromion Bone Region No depletion  Scapular Bone Region Unable to assess  [CTO brace]  Dorsal Hand No depletion  Patellar Region No depletion  Anterior Thigh Region No depletion  Hair Reviewed  Eyes Reviewed  Mouth Reviewed  Skin Reviewed  Nails Reviewed    Diet Order:   Diet Order             Diet Carb Modified Fluid consistency: Thin; Room service appropriate? Yes  Diet effective now                   EDUCATION NEEDS:   Education needs have been addressed  Skin:  Skin Assessment: Skin Integrity Issues: Skin Integrity Issues:: DTI, Stage II DTI: Sacrum, R ischium Stage II: L ischium  Last BM:  10/5 type 6  Height:   Ht Readings from Last 1 Encounters:  05/23/24 5' 2.5 (1.588 m)    Weight:   Wt Readings from Last 1 Encounters:  06/13/24 70.2 kg    BMI:  Body mass index is 27.86 kg/m.  Estimated Nutritional Needs:   Kcal:  8455-8244 kcals  Protein:  70-90 grams  Fluid:  1.5-1.7 L/d    Alexandria Potters, MS, RD, LDN Clinical Dietitian  Please see AMiON for contact information.

## 2024-06-13 NOTE — Progress Notes (Signed)
 HD#2 SUBJECTIVE:  Patient Summary: Alexandria Harvey is a 43 y.o. person living with a PMH of epilepsy, HTN, mild intellectual disability, T2DM, schizophrenia who presented to ED via EMS for seizure and fevers over 103 and admitted for acute encephalopathy, which is improving.   Overnight Events: None  Interim History: Patient sleeping comfortably in bed. She is easily woken up by voice and able to converse. Afebrile. She states she is feeling well today and is hungry. She is only alert and oriented to name and birthday, and was unable to state the year or place. Will remove sutures today.  OBJECTIVE:  Vital Signs: Vitals:   06/13/24 0008 06/13/24 0435 06/13/24 0617 06/13/24 0729  BP: 111/70 113/78  111/67  Pulse: 89 81  91  Resp: 18 18    Temp: 98.1 F (36.7 C) 97.7 F (36.5 C)  97.7 F (36.5 C)  TempSrc: Oral Oral  Oral  SpO2: 100% 100%  100%  Weight:   70.2 kg    Supplemental O2: Nasal Cannula SpO2: 100 % O2 Flow Rate (L/min): 2 L/min  Filed Weights   06/13/24 0617  Weight: 70.2 kg     Intake/Output Summary (Last 24 hours) at 06/13/2024 0935 Last data filed at 06/13/2024 0900 Gross per 24 hour  Intake 617.54 ml  Output --  Net 617.54 ml   Net IO Since Admission: 617.54 mL [06/13/24 0935]  Physical Exam: Physical Exam Constitutional:      Comments: Sleeping, no acute distress  HENT:     Head: Normocephalic.     Comments: Recent laceration scar to right forehead approx.7-8cm, healing appropriately. Staples present in scalp. Patient placed in CTO brace for C-spine stabilization.    Mouth/Throat:     Pharynx: No oropharyngeal exudate or posterior oropharyngeal erythema.  Cardiovascular:     Rate and Rhythm: Normal rate and regular rhythm.     Pulses: Normal pulses.     Heart sounds: Normal heart sounds.  Pulmonary:     Effort: Pulmonary effort is normal.     Breath sounds: Normal breath sounds.  Abdominal:     General: Bowel sounds are normal.      Palpations: Abdomen is soft.  Musculoskeletal:     Right lower leg: Edema present.     Left lower leg: Edema present.  Skin:    General: Skin is warm and dry.  Neurological:     Comments: No response to tactile,verbal stimuli  Psychiatric:     Comments: Patient asleep-did not assess    Patient Lines/Drains/Airways Status     Active Line/Drains/Airways     Name Placement date Placement time Site Days   Peripheral IV 06/11/24 18 G Anterior;Left Forearm 06/11/24  1038  Forearm  1   Peripheral IV 06/11/24 22 G Anterior;Proximal;Right Forearm 06/11/24  1038  Forearm  1   Wound 05/23/24 1600 Other (Comment) Face Right;Upper 05/23/24  1600  Face  20   Wound 06/11/24 Pressure Injury Sacrum Mid Unstageable - Full thickness tissue loss in which the base of the injury is covered by slough (yellow, tan, gray, green or brown) and/or eschar (tan, brown or black) in the wound bed. 06/11/24  --  Sacrum  1   Wound 06/11/24 Pressure Injury Ischial tuberosity Left Stage 2 -  Partial thickness loss of dermis presenting as a shallow open injury with a red, pink wound bed without slough. 06/11/24  --  Ischial tuberosity  1   Wound 06/11/24 Pressure Injury Ischial tuberosity Right Deep  Tissue Pressure Injury - Purple or maroon localized area of discolored intact skin or blood-filled blister due to damage of underlying soft tissue from pressure and/or shear. 06/11/24  --  Ischial tuberosity  1            Pertinent labs and imaging:     Latest Ref Rng & Units 06/13/2024    4:31 AM 06/12/2024    4:40 AM 06/11/2024    9:54 AM  CBC  WBC 4.0 - 10.5 K/uL 9.0  10.3  10.7   Hemoglobin 12.0 - 15.0 g/dL 89.5  88.0  86.2   Hematocrit 36.0 - 46.0 % 32.9  38.5  42.8   Platelets 150 - 400 K/uL 143  116  198        Latest Ref Rng & Units 06/13/2024    4:31 AM 06/12/2024    4:40 AM 06/11/2024    9:54 AM  CMP  Glucose 70 - 99 mg/dL 861  896  819   BUN 6 - 20 mg/dL 7  15  22    Creatinine 0.44 - 1.00 mg/dL 9.66   9.55  9.22   Sodium 135 - 145 mmol/L 133  133  130   Potassium 3.5 - 5.1 mmol/L 3.4  3.6  4.6   Chloride 98 - 111 mmol/L 103  103  100   CO2 22 - 32 mmol/L 18  16  15    Calcium  8.9 - 10.3 mg/dL 8.3  8.0  8.3   Total Protein 6.5 - 8.1 g/dL 5.6  5.7  6.4   Total Bilirubin 0.0 - 1.2 mg/dL 0.8  0.9  0.9   Alkaline Phos 38 - 126 U/L 46  46  53   AST 15 - 41 U/L 19  23  30    ALT 0 - 44 U/L 13  13  18      No results found.   ASSESSMENT/PLAN:  Assessment: Principal Problem:   Encephalopathy Active Problems:   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)   Closed nondisplaced fracture of seventh cervical vertebra with routine healing  Alexandria Harvey is a 43 y.o. person living with a PMH of epilepsy, HTN, mild intellectual disability, T2DM, schizophrenia who presented to ED via EMS for seizure and fevers over 103 and admitted for acute encephalopathy, which is improving.   Plan: Acute Toxic Metabolic Encephalopathy 2/2 Sepsis vs Febrile Seizures vs SCI Leukocytosis Leukocytosis resolved to 9.0. Afebrile, will continue to monitor temperatures. A&Ox2 to person and birthday, but not place or year. Able to identify common objects like a pen/watch. Pending repeat blood cultures. On day 3 of IV Vancomycin. Valproic acid  level improving to 42 today, still subtherapeutic. Etiology for encephalopathy likely multifactorial in the setting of sepsis secondary to sacral ulcer and history of seizures lowering sepsis threshold. -- Continue on IV Vancomycin (Day 3) per pharmacy -- Pending blood cultures -- Will follow morning CMP, CBC with diff -- Continuous pulse ox -- Would benefit from neurology outpatient follow up  Sacral Wound POA Per EDP, Stage 3 sacral wound present without purulence, induration, or fluctuance. Minimal skin breakdown. Wound care consulted. Air mattress added. Will add barrier emollient. SLP evaluated patient, recommends regular food diet and to swallow  whole pills as able. Will follow, may need modified barium swallow to assess for physiology. Albumin low 2.0, will encourage PO intake and add Ensure drinks to supplement protein. - Turn patient q2hr - RD consult - Ensure drinks - Monitor albumin   Epilepsy Valproic  acid level subtherapeutic at 42. On home Depakote  500 TID. If continuously subtherapeutic 10/8, will consider giving additional loading dose of Depakote . Per caregiver, Depakote  has recently been crushed due to patient's inability to swallow pills well given spinal cord injury. This can reduce effectiveness of medication, and may be contributing to patient's seizure on presentation, as well as encephalopathy. TSH WNL.  - Continue Depakote  500mg  TID  - Monitor Valproic acid  level   C-Spine Fractures Central Spinal Cord Injury  Patient had recent fall on 9/16 and sustained laceration to right forehead as well as fractures to C3, C5, and C7 posterior element fractures with central cord syndrome. NSGY (Dr. Lanis) evaluated patient during her 9/16-9/18 admission and medically managed with CTO brace.  -Patient should follow up with NSGY outpatient -Removed staples while inpatient   Hyponatremia Na 130 on admission. Has had previous, transient history, but recurrent in past month. Baseline 138-141. Potentially due to acute spinal cord injury and autonomic dysfunction, however could also be secondary to SIADH. Urine/serum osmol and urine sodium WNL. Will continue to monitor.  - CMP    Hypertension BP low-normal on admission. Previously on Atenolol  and Lisinopril , both medications held on 9/22 due to normotensive readings. Hold home antihypertensives.   T2DM Last A1c 7.0 05/23/2024. Patient on Metformin  1000mg  BID. Patient allergic to Novolog  insulin , will hold SSI. Given BG at goal, will continue to hold Metformin .  -CBG   Hypokalemia K this morning 3.4. Replenished with KCL 40mEq powder. Will continue to monitor.    Schizophrenia On home Clozapine  100mg . Lorazepam  0.5mg  held on last discharge. Will hold medications in setting of encephalopathy. CK WNL.   Hyperlipidemia On home Atorvastatin  80mg , Zetia 10mg . Will restart medications.  Best Practice: Diet: Regular diet IVF: Fluids: LR, Rate: 500 cc bolus VTE: enoxaparin  (LOVENOX ) injection 40 mg Start: 06/13/24 0900 Place and maintain sequential compression device Start: 06/11/24 1912 SCDs Start: 06/11/24 1556 Code: Full  Disposition planning: Therapy Recs: Rehab, DME: none Family Contact: Mother [place], caregiver [place] DISPO: Anticipated discharge in 1-2 days to Rehab pending IV antibiotics, Wound care, and Clinical Improvement.  Signature:  Eugenio Dollins, DO [facility]  9:35 AM, 06/13/2024  On Call pager [phone]

## 2024-06-13 NOTE — Progress Notes (Signed)
 Speech Language Pathology Treatment: Dysphagia  Patient Details Name: Alexandria Harvey MRN: [id number] DOB: 08/08/1981 Today's Date: 06/13/2024 Time: 9059-9049 SLP Time Calculation (min) (ACUTE ONLY): 10 min  Assessment / Plan / Recommendation Clinical Impression  F/y after yesterday's clinical swallow evaluation.  Alexandria Harvey was talkative and interactive.  She accepted multiple, sequential sips of water  and bites of crackers with the perception of improved efficiency (fewer sub-swallows required with each bolus).  There were no s/s of aspiration.    Recommend continued current diet - carb modified; thin liquids.  No further SLP  needs identified. Our service will sign off.   HPI HPI: Alexandria Harvey is a 43 y.o. who presented from [facility] to ED with acute toxic metabolic encephalopathy 2/2 sepsis vs seizures vs SCI leukocytosis.  PMHx  recent fall with C3,C5, and C7 fractures with spinal cord injury in CTO brace, epilepsy, HTN, mild intellectual disability, T2DM, schizophrenia. Her mother reports that pt eats a regular diet at baseline, normally swallows pills whole.      SLP Plan  All goals met          Recommendations  Diet recommendations: Regular;Thin liquid Liquids provided via: Straw;Cup Medication Administration: Whole meds with puree Supervision: Trained caregiver to feed patient Postural Changes and/or Swallow Maneuvers: Seated upright 90 degrees                  Oral care BID     Dysphagia, pharyngeal phase (R13.13)     All goals met    Alexandria Harvey L. Vona, MA CCC/SLP Clinical Specialist - Acute Care SLP Acute Rehabilitation Services Office number [phone]  Alexandria Harvey  06/13/2024, 10:02 AM

## 2024-06-14 DIAGNOSIS — G40909 Epilepsy, unspecified, not intractable, without status epilepticus: Secondary | ICD-10-CM | POA: Diagnosis not present

## 2024-06-14 DIAGNOSIS — G9340 Encephalopathy, unspecified: Secondary | ICD-10-CM | POA: Diagnosis not present

## 2024-06-14 LAB — COMPREHENSIVE METABOLIC PANEL WITH GFR
ALT: 28 U/L (ref 0–44)
AST: 37 U/L (ref 15–41)
Albumin: 1.8 g/dL — ABNORMAL LOW (ref 3.5–5.0)
Alkaline Phosphatase: 45 U/L (ref 38–126)
Anion gap: 10 (ref 5–15)
BUN: <5 mg/dL — ABNORMAL LOW (ref 6–20)
CO2: 20 mmol/L — ABNORMAL LOW (ref 22–32)
Calcium: 8.2 mg/dL — ABNORMAL LOW (ref 8.9–10.3)
Chloride: 104 mmol/L (ref 98–111)
Creatinine, Ser: 0.34 mg/dL — ABNORMAL LOW (ref 0.44–1.00)
GFR, Estimated: >60 mL/min (ref >60)
Glucose, Bld: 212 mg/dL — ABNORMAL HIGH (ref 70–99)
Potassium: 3.6 mmol/L (ref 3.5–5.1)
Sodium: 134 mmol/L — ABNORMAL LOW (ref 135–145)
Total Bilirubin: 0.4 mg/dL (ref 0.0–1.2)
Total Protein: 5.3 g/dL — ABNORMAL LOW (ref 6.5–8.1)

## 2024-06-14 LAB — CULTURE, BLOOD (ROUTINE X 2)

## 2024-06-14 LAB — CBC
HCT: 30 % — ABNORMAL LOW (ref 36.0–46.0)
Hemoglobin: 9.6 g/dL — ABNORMAL LOW (ref 12.0–15.0)
MCH: 27.9 pg (ref 26.0–34.0)
MCHC: 32 g/dL (ref 30.0–36.0)
MCV: 87.2 fL (ref 80.0–100.0)
Platelets: 175 K/uL (ref 150–400)
RBC: 3.44 MIL/uL — ABNORMAL LOW (ref 3.87–5.11)
RDW: 18.3 % — ABNORMAL HIGH (ref 11.5–15.5)
WBC: 8.6 K/uL (ref 4.0–10.5)
nRBC: 0 % (ref 0.0–0.2)

## 2024-06-14 LAB — GLUCOSE, CAPILLARY
Glucose-Capillary: 248 mg/dL — ABNORMAL HIGH (ref 70–99)
Glucose-Capillary: 178 mg/dL — ABNORMAL HIGH (ref 70–99)
Glucose-Capillary: 268 mg/dL — ABNORMAL HIGH (ref 70–99)
Glucose-Capillary: 271 mg/dL — ABNORMAL HIGH (ref 70–99)

## 2024-06-14 LAB — VALPROIC ACID LEVEL: Valproic Acid Lvl: 44 ug/mL — ABNORMAL LOW (ref 50–100)

## 2024-06-14 MED ORDER — DIVALPROEX SODIUM 250 MG PO DR TAB: 750.0000 mg | DELAYED_RELEASE_TABLET | Freq: Three times a day (TID) | ORAL | Status: DC

## 2024-06-14 MED ORDER — COLLAGENASE 250 UNIT/GM EX OINT: TOPICAL_OINTMENT | Freq: Every day | CUTANEOUS | Status: AC

## 2024-06-14 MED ORDER — JUVEN PO PACK: 1.0000 | PACK | Freq: Two times a day (BID) | ORAL | Status: AC

## 2024-06-14 MED ORDER — VALPROATE SODIUM 100 MG/ML IV SOLN
1000.0000 mg | Freq: Once | INTRAVENOUS | Status: AC
  Administered 2024-06-14: 1000 mg via INTRAVENOUS
  Filled 2024-06-14: qty 10

## 2024-06-14 MED ORDER — ENSURE PLUS HIGH PROTEIN PO LIQD: 237.0000 mL | Freq: Two times a day (BID) | ORAL | Status: AC

## 2024-06-14 NOTE — Progress Notes (Signed)
 Called report to [employer] spoke with [place].

## 2024-06-14 NOTE — Care Management Important Message (Signed)
 Important Message  Patient Details  Name: Alexandria Harvey MRN: [id number] Date of Birth: 02/24/1981   Important Message Given:  Yes - Medicare IM     Claretta Deed 06/14/2024, 4:05 PM

## 2024-06-14 NOTE — Plan of Care (Signed)
  Problem: Education: Goal: Knowledge of General Education information will improve Description: Including pain rating scale, medication(s)/side effects and non-pharmacologic comfort measures Outcome: Adequate for Discharge   Problem: Health Behavior/Discharge Planning: Goal: Ability to manage health-related needs will improve Outcome: Adequate for Discharge   Problem: Clinical Measurements: Goal: Ability to maintain clinical measurements within normal limits will improve Outcome: Adequate for Discharge Goal: Will remain free from infection Outcome: Adequate for Discharge Goal: Diagnostic test results will improve Outcome: Adequate for Discharge Goal: Respiratory complications will improve Outcome: Adequate for Discharge Goal: Cardiovascular complication will be avoided Outcome: Adequate for Discharge   Problem: Activity: Goal: Risk for activity intolerance will decrease Outcome: Adequate for Discharge   Problem: Coping: Goal: Level of anxiety will decrease Outcome: Adequate for Discharge   

## 2024-06-14 NOTE — Discharge Instructions (Addendum)
 Alexandria Harvey,  You were recently admitted to [facility] for a high fever of 103F and new confusion that made you very sick. We gave you IV fluids, IV antibiotics, as well as your seizure medicines through the IV. You responded well to the IV seizure medicine, and may require a high dose when you leave the hospital. I will send you a 30-day supply to help prevent your seizures from recurring.   Continue taking your home medications with the following changes:  Start taking: Collagenase ointment for your wound. Ensure protein drinks.  You can have these in between each meal up to 2 times a day. Juven nutritional supplements. Take 1 packet 2 times daily between meals.  Change how you take: Depakote .  I am going to change this from 500mg  three times daily up to 750mg  three times daily. Please do NOT crush this medication. You must swallow this tablet whole. If you crush this medicine, it will make the pill less effective than they should be. I highly recommend that you see your neurologist to follow up for refills on this medicine.  Pause taking: Atenolol  50mg  tablet. Your blood pressure was normal while in the hospital.  Lisinopril  2.5mg  tablet. Your blood pressure was normal while in the hospital.  Continue taking: Acetaminophen  500mg  tablet.  Take 2 tablets every 8 hours as needed Atorvastatin  80mg  tablet. Take 1 tablet daily at bedtime Benztropine  1mg  tablet.  Take 1 tablet twice daily Clozapine  100mg  tablet.  Take 1 tablet twice daily Ezetimibe (Zetia) 10mg  tablet.  Take 1 tablet daily Gabapentin  300mg  tablet.  Take 1 capsule 3 times daily. Lubiprostone  (Amitiza ) 24mcg capsule. Take 1 capsule daily with breakfast. Metformin  500mg .  Take 2 tablets by mouth twice daily with meals. Naloxone  injections as needed Omeprazole 20 mg capsule.  Take 20 mg by mouth in the morning and at bedtime Oxycodone  5 mg tablet.  Take 0.5 to 1 tablet every 6 hours as needed for breakthrough  pain. Oyster shell calcium  tablets MiraLAX  packets Simethicone  chewable tablets Vitamin D  capsules Zinc oxide cream   You should seek further medical care if you develop a new fever > 101F, any new confusion, or any new weakness that is abnormal.  We recommend that you see your primary care doctor in about a week to make sure that you continue to improve. I also highly suggest following up with your Neurologist, Dr. Carlin Bull, MD, to follow up your seizures.   You will need to make sure to have your sacral wound cleaned and that you are up and moving with physical therapy as well as eating a good amount of protein to help it heal!  We scheduled a follow up appointment with the Neurosurgery team to talk about your brace and your spinal fractures. Your appointment is 06/21/2024 at 12:45PM.   We are so glad that you are feeling better.  Sincerely, Staceyann Knouff, DO

## 2024-06-14 NOTE — Inpatient Diabetes Management (Signed)
 Inpatient Diabetes Program Recommendations  AACE/[name]: New Consensus Statement on Inpatient Glycemic Control (2015)  Target Ranges:  Prepandial:   less than 140 mg/dL      Peak postprandial:   less than 180 mg/dL (1-2 hours)      Critically ill patients:  140 - 180 mg/dL   Lab Results  Component Value Date   GLUCAP 178 (H) 06/14/2024   HGBA1C 7.0 (H) 05/23/2024    Review of Glycemic Control  Latest Reference Range & Units 06/13/24 08:10 06/13/24 11:12 06/13/24 17:34 06/13/24 20:53 06/14/24 01:02 06/14/24 08:08  Glucose-Capillary 70 - 99 mg/dL 860 (H) 842 (H) 721 (H) 255 (H) 248 (H) 178 (H)  (H): Data is abnormally high Diabetes history: Type 2 DM Outpatient Diabetes medications: Metformin  1000 mg BID Current orders for Inpatient glycemic control: none  Inpatient Diabetes Program Recommendations:    Consider adding Novolog  0-9 units TID & HS.  Thanks, Tinnie Minus, MSN, RNC-OB Diabetes Coordinator [phone] (8a-5p)

## 2024-06-14 NOTE — Discharge Summary (Signed)
 Name: Alexandria Harvey MRN: [id number] DOB: 1981-01-20 43 y.o. PCP: [facility]  Date of Admission: 06/11/2024  9:46 AM Date of Discharge: 06/14/2024  Attending Physician: Dr. Reyes Fenton  Discharge Diagnosis: Principal Problem:   Seizure disorder [facility]) Active Problems:   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)   Encephalopathy   Closed nondisplaced fracture of seventh cervical vertebra with routine healing  Hyponatremia Hypokalemia Hypoalbuminemia Leukocytosis Anemia  Discharge Medications: Allergies as of 06/14/2024       Reactions   Haloperidol Lactate Other (See Comments)   Unknown reaction   Shrimp [shellfish Allergy] Hives, Rash   Strawberry (diagnostic) Rash        Medication List     PAUSE taking these medications    atenolol  50 MG tablet Wait to take this until your doctor or other care provider tells you to start again. Commonly known as: TENORMIN  Take 1 tablet (50 mg total) by mouth daily.   ferrous sulfate  325 (65 FE) MG tablet Wait to take this until your doctor or other care provider tells you to start again. Take 1 tablet (325 mg total) by mouth daily with breakfast.   lisinopril  2.5 MG tablet Wait to take this until your doctor or other care provider tells you to start again. Commonly known as: ZESTRIL  Take 1 tablet (2.5 mg total) by mouth daily.       TAKE these medications    acetaminophen  500 MG tablet Commonly known as: TYLENOL  Take 2 tablets (1,000 mg total) by mouth every 8 (eight) hours as needed.   atorvastatin  80 MG tablet Commonly known as: LIPITOR  Take 1 tablet (80 mg total) by mouth at bedtime.   benztropine  1 MG tablet Commonly known as: COGENTIN  Take 1 mg by mouth 2 (two) times daily.   cloZAPine  100 MG tablet Commonly known as: CLOZARIL  Take 100 mg by mouth 2 (two) times daily.   collagenase 250 UNIT/GM ointment Commonly known as: SANTYL Apply  topically daily. Start taking on: June 15, 2024   divalproex  250 MG DR tablet Commonly known as: DEPAKOTE  Take 3 tablets (750 mg total) by mouth 3 (three) times daily. What changed:  medication strength how much to take   ezetimibe 10 MG tablet Commonly known as: ZETIA Take 10 mg by mouth daily.   feeding supplement Liqd Take 237 mLs by mouth 2 (two) times daily between meals.   nutrition supplement (JUVEN) Pack Take 1 packet by mouth 2 (two) times daily between meals.   gabapentin  300 MG capsule Commonly known as: NEURONTIN  Take 1 capsule (300 mg total) by mouth 3 (three) times daily.   lubiprostone  24 MCG capsule Commonly known as: AMITIZA  Take 1 capsule (24 mcg total) by mouth daily with breakfast.   metFORMIN  500 MG tablet Commonly known as: GLUCOPHAGE  Take 1,000 mg by mouth 2 (two) times daily with a meal.   methocarbamol  500 MG tablet Commonly known as: ROBAXIN  Take 2 tablets (1,000 mg total) by mouth every 8 (eight) hours as needed for muscle spasms.   naloxone  0.4 MG/ML injection Commonly known as: NARCAN  Inject 1 mL (0.4 mg total) into the vein as needed.   omeprazole 20 MG capsule Commonly known as: PRILOSEC Take 20 mg by mouth in the morning and at bedtime.   oxyCODONE  5 MG immediate release tablet Commonly known as: Oxy IR/ROXICODONE  Take 0.5-1 tablets (2.5-5 mg total) by mouth every 6 (six) hours as needed for breakthrough pain (2.5 mg for moderate,  5 mg for severe). What changed: how much to take   [employer] Calcium  w/D 500-5 MG-MCG Tabs Take 1 tablet by mouth 2 (two) times daily.   polyethylene glycol 17 g packet Commonly known as: MIRALAX  / GLYCOLAX  Take 17 g by mouth 2 (two) times daily.   simethicone  80 MG chewable tablet Commonly known as: MYLICON Chew 80 mg by mouth 2 (two) times daily.   Vitamin D  (Ergocalciferol ) 1.25 MG (50000 UNIT) Caps capsule Commonly known as: DRISDOL  Take 1 capsule (50,000 Units total) by mouth once a  week. What changed: additional instructions   zinc oxide 11.3 % Crea cream Commonly known as: BALMEX Apply 1 Application topically as needed.               Discharge Care Instructions  (From admission, onward)           Start     Ordered   06/14/24 0000  Discharge wound care:       Comments: : 1.        Cleanse sacral wound with Vashe wound cleanser Soila [phone]) do not rinse and allow to air dry. Apply 1/4 thick layer of Santyl to wound bed, top with saline moist gauze, dry gauze and silicone foam or ABD pad whichever is preferred.  2.         Cleanse B buttock/ischial wounds with Vashe, allow to air dry.  Apply Xeroform gauze (Lawson [phone]) to wound beds daily and secure with silicone foam or ABD pad and tape whichever is preferred   06/14/24 1319            Disposition and follow-up:   Ms.Donald JAYDEEN DARLEY was discharged from [facility] in Stable condition.  At the hospital follow up visit please address:  1.  Follow-up:  a. Depakote  increasing to 750mg  TID. Patient previously taking 500mg  TID, however was found to be subtherapeutic with a Valproic acid  level of 29 on admission. Improved to 44 at discharge, but will need close monitoring and dose adjustments. Please ensure her Depakote  tablets are NOT CRUSHED. She is able to swallow pills whole. Needs neurology follow up.    b. Patient's home antihypertensives have been held during the last two discharges due to normotensive readings. Please address if patient needs to remain on these medications and discontinue if unneeded.     c. Patient has large Stage 3 sacral decubitus ulcer requiring proper wound care, dressings, and protein to help heal. She should be able to work with PT and OT at her SNF to promote blood flow and proper healing. She also needs proper precautions, like q 2hr turns and air mattress in her bed if she cannot be mobile.  She is to follow up with neurosurgery on 10/15 at  12:45pm.  2.  Labs / imaging needed at time of follow-up: CBC, CMP, Valproic Acid  Level   Follow-up Appointments:  Contact information for after-discharge care     Destination     [facility] .   Service: Skilled Nursing Contact information: [address] [place]  [address] [phone]                     Hospital Course by problem list: ARLETHA MARSCHKE is a 43 y.o. person living with a PMH of epilepsy, HTN, mild intellectual disability, T2DM, schizophrenia who presented to ED via EMS for seizure and fevers over 103 and admitted for encephalopathy on hospital day 0.   Acute  Toxic Metabolic Encephalopathy 2/2 Sepsis vs Febrile Seizures vs SCI Leukocytosis Presented to ED with AMS that began the morning of 10/5. Initial mild leukocytosis 10.7 that resolved. Patient received  IV CTX 2g  and Azithromycin 500mg  in ED. Presented with alternating temperatures between 99-105.5, but remained afebrile through hospitalization. COVID/flu negative. Lactic acid peaked to 3.5, down to 2.6. Patient received one 1L LR bolus in the ED, however was not started on continuous LR infusion. Was rebolused with 1L LR. Blood cultures drawn and eventually grew Staph capitis and hominis, indicating contamination. Repeat blood cultures were negative for any growth. CXR without obvious infiltrate but shows asymmetric elevation right hemidiaphragm. Likely secondary to central cord injury sustained 9/16. Ammonia, Mag, Phosphorus, UA, UDS, and RPP negative. Etiology for encephalopathy  initially unclear due to patient's inability to give history, but likely multifactorial in the setting of sepsis secondary to sacral ulcer/possible UTI, history of seizures lowering sepsis threshold, and structural injury with central cord injury. Valproic acid  level subtherapeutic, potentially due to crushing of medication in facility. Could not rule out meningitis, however patient has sacral wound and  could not get LP. Additionally, with CTO brace, difficult to assess for other meningeal signs. Placed on broad spectrum IV Vancomycin and Zosyn per pharmacy, as well as IV CTX 2g the morning of 10/6 to cover for meningitis. CT head shows unchanged atrophy with no acute intracranial abnormality. Chronic small vessel ischemia. Diminishing right front scalp hematoma. Curbsided neurology, recommended loading dose of Depakote  1000mg , then 500mg  TID. On further evaluation, patient became A&Ox2 to person and birthday, but not place or year. Able to identify common objects like a pen/watch. Given acute improvement of encephalopathy, deescalated antibiotics coverage to IV Vancomycin for 3 days before discontinuing. Etiology more likely due to subtherapeutic Valproic acid  level potentially due to crushing of medication in facility but improved with IV Depakote . Patient would benefit from neurology outpatient follow up. Discharged on Depakote  750mg  TID.    Sacral Wound POA Per EDP, Stage 3 sacral wound present without purulence, induration, or fluctuance. Minimal skin breakdown. Wound care consulted. Air mattress and barrier emollient added. Placed orders to turn patient q2hr. Consulted RD and SLP, monitored albumin.  SLP recommended regular food diet and to swallow whole pills as able. Albumin remained low at 1.8-2.0, encouraged PO intake and added Ensure drinks to supplement protein.     Epilepsy Valproic acid  level subtherapeutic at 29 on presentation. On home Depakote  500 TID. Unknown last dose prior to admission. Improved with IV Depakote  500mg  TID. Per caregiver, Depakote  has recently been crushed due to patient's inability to swallow pills well given spinal cord injury. This can reduce effectiveness of medication, and may be contributing to patient's seizure on presentation, as well as encephalopathy. Last TSH checked 9 years ago, TSH WNL during admission.    C-Spine Fractures Central Spinal Cord Injury   Patient had recent fall on 9/16 and sustained laceration to right forehead as well as fractures to C3, C5, and C7 posterior element fractures with central cord syndrome. NSGY (Dr. Lanis) evaluated patient during her 9/16-9/18 admission and medically managed with CTO brace. Removed staples while inpatient.    Hyponatremia Na 130 on admission. Has had previous, transient history, but recurrent in past month. Baseline 138-141. Potentially due to acute spinal cord injury and autonomic dysfunction, however could also be secondary to SIADH. Continued to monitor and assessed urine/serum osmol and urine sodium, which was within normal limits.    Hypertension BP low-normal on  admission. Previously on Atenolol  and Lisinopril , both medications held on 9/22 due to normotensive readings. Held home antihypertensives.   T2DM Last A1c 7.0 05/23/2024. Patient on Metformin  1000mg  BID. Patient allergic to Novolog  insulin , will hold SSI. Given encephalopathy, held home Metformin . BG remained at goal until day of discharge.    Hypokalemia, resolved K this morning 3.4. Replenished with KCL 40mEq powder. Improved to 3.6. Continued to monitor.    Schizophrenia On home Clozapine  100mg . Lorazepam  0.5mg  held on last discharge. Held Clozapine  100mg  in setting of encephalopathy until patient improved, then restarted.  CK WNL.    Hyperlipidemia On home Atorvastatin  80mg , Zetia 10mg . Held medications in setting of encephalopathy until patient improved, then restarted.      Discharge Subjective: Patient is evaluated at bedside, where she was sleeping comfortably.  She is difficult to wake up, where she ignored both my and my attending's voices to continue to sleep.  She did respond to opening her eyes with continued attempts.  Stopped IV vancomycin today, as blood cultures grew Staphylococcus capitis and hominis, indicating contamination.  Spoke with patient's mother, legal guardian, and caretaker, and agreed to  discharge today to SNF.  Discharge Exam:   BP 131/79 (BP Location: Right Arm)   Pulse 96   Temp 97.7 F (36.5 C) (Oral)   Resp 18   Wt 70.2 kg   SpO2 100%   BMI 27.86 kg/m  Physical Exam Constitutional:      Comments: Sleeping, no acute distress  HENT:     Head: Normocephalic.     Comments: Recent laceration scar to right forehead approx.7-8cm, healing appropriately. Patient placed in CTO brace for C-spine stabilization.    Mouth/Throat:     Pharynx: No oropharyngeal exudate or posterior oropharyngeal erythema.  Cardiovascular:     Rate and Rhythm: Normal rate and regular rhythm.     Pulses: Normal pulses.     Heart sounds: Normal heart sounds.  Pulmonary:     Effort: Pulmonary effort is normal.     Breath sounds: Normal breath sounds.  Abdominal:     General: Bowel sounds are normal.     Palpations: Abdomen is soft.  Musculoskeletal:     Right lower leg: Edema present.     Left lower leg: Edema present.  Skin:    General: Skin is warm and dry.  Neurological:     General: No focal deficit present.  Psychiatric:        Mood and Affect: Mood normal.        Behavior: Behavior normal.     Pertinent Labs, Studies, and Procedures:     Latest Ref Rng & Units 06/14/2024    3:36 AM 06/13/2024    4:31 AM 06/12/2024    4:40 AM  CBC  WBC 4.0 - 10.5 K/uL 8.6  9.0  10.3   Hemoglobin 12.0 - 15.0 g/dL 9.6  89.5  88.0   Hematocrit 36.0 - 46.0 % 30.0  32.9  38.5   Platelets 150 - 400 K/uL 175  143  116        Latest Ref Rng & Units 06/14/2024    3:36 AM 06/13/2024    4:31 AM 06/12/2024    4:40 AM  CMP  Glucose 70 - 99 mg/dL 787  861  896   BUN 6 - 20 mg/dL 5  7  15    Creatinine 0.44 - 1.00 mg/dL 9.65  9.66  9.55   Sodium 135 - 145 mmol/L 134  133  133   Potassium 3.5 - 5.1 mmol/L 3.6  3.4  3.6   Chloride 98 - 111 mmol/L 104  103  103   CO2 22 - 32 mmol/L 20  18  16    Calcium  8.9 - 10.3 mg/dL 8.2  8.3  8.0   Total Protein 6.5 - 8.1 g/dL 5.3  5.6  5.7   Total Bilirubin  0.0 - 1.2 mg/dL 0.4  0.8  0.9   Alkaline Phos 38 - 126 U/L 45  46  46   AST 15 - 41 U/L 37  19  23   ALT 0 - 44 U/L 28  13  13      CT HEAD WO CONTRAST ( ) Result Date: 06/11/2024 CLINICAL DATA:  Provided history: Mental status change, unknown cause Acute Encephalopathy EXAM: CT HEAD WITHOUT CONTRAST TECHNIQUE: Contiguous axial images were obtained from the base of the skull through the vertex without intravenous contrast. RADIATION DOSE REDUCTION: This exam was performed according to the departmental dose-optimization program which includes automated exposure control, adjustment of the mA and/or kV according to patient size and/or use of iterative reconstruction technique. COMPARISON:  Most recent head CT 05/23/2024 FINDINGS: Brain: No evidence of acute infarction, hemorrhage, extra-axial collection or mass lesion/mass effect. Unchanged atrophy, advanced for age, with stable periventricular white matter changes. Vascular: No hyperdense vessel or unexpected calcification. Skull: No fracture or focal lesion.  Frontal hyperostosis. Sinuses/Orbits: No acute finding. Other: Diminishing right frontal scalp hematoma. Overlying skin staples in place IMPRESSION: 1. No acute intracranial abnormality. 2. Unchanged atrophy and chronic small vessel ischemia. 3. Diminishing right frontal scalp hematoma. Electronically Signed   By: Andrea Gasman M.D.   On: 06/11/2024 19:37   DG Chest Port 1 View Result Date: 06/11/2024 CLINICAL DATA:  Sepsis. EXAM: PORTABLE CHEST 1 VIEW COMPARISON:  05/23/2024 FINDINGS: Asymmetric elevation right hemidiaphragm. The lungs are clear without focal pneumonia, edema, pneumothorax or pleural effusion. The cardiopericardial silhouette is within normal limits for size. No acute bony abnormality. IMPRESSION: No active disease. Electronically Signed   By: Camellia Candle M.D.   On: 06/11/2024 10:25     Discharge Instructions:   Discharge Instructions      [place], Genson were  recently admitted to [facility] for a high fever of 103F and new confusion that made you very sick. We gave you IV fluids, IV antibiotics, as well as your seizure medicines through the IV. You responded well to the IV seizure medicine, and may require a high dose when you leave the hospital. I will send you a 30-day supply to help prevent your seizures from recurring.   Continue taking your home medications with the following changes:  Start taking: Collagenase ointment for your wound. Ensure protein drinks.  You can have these in between each meal up to 2 times a day. Juven nutritional supplements. Take 1 packet 2 times daily between meals.  Change how you take: Depakote .  I am going to change this from 500mg  three times daily up to 750mg  three times daily. Please do NOT crush this medication. You must swallow this tablet whole. If you crush this medicine, it will make the pill less effective than they should be. I highly recommend that you see your neurologist to follow up for refills on this medicine.  Pause taking: Atenolol  50mg  tablet. Your blood pressure was normal while in the hospital.  Lisinopril  2.5mg  tablet. Your blood pressure was normal while in the hospital.  Continue taking: Acetaminophen   500mg  tablet.  Take 2 tablets every 8 hours as needed Atorvastatin  80mg  tablet. Take 1 tablet daily at bedtime Benztropine  1mg  tablet.  Take 1 tablet twice daily Clozapine  100mg  tablet.  Take 1 tablet twice daily Ezetimibe (Zetia) 10mg  tablet.  Take 1 tablet daily Gabapentin  300mg  tablet.  Take 1 capsule 3 times daily. Lubiprostone  (Amitiza ) 24mcg capsule. Take 1 capsule daily with breakfast. Metformin  500mg .  Take 2 tablets by mouth twice daily with meals. Naloxone  injections as needed Omeprazole 20 mg capsule.  Take 20 mg by mouth in the morning and at bedtime Oxycodone  5 mg tablet.  Take 0.5 to 1 tablet every 6 hours as needed for breakthrough pain. Oyster shell calcium   tablets MiraLAX  packets Simethicone  chewable tablets Vitamin D  capsules Zinc oxide cream   You should seek further medical care if you develop a new fever > 101F, any new confusion, or any new weakness that is abnormal.  We recommend that you see your primary care doctor in about a week to make sure that you continue to improve. I also highly suggest following up with your Neurologist, Dr. Carlin Bull, MD, to follow up your seizures.   You will need to make sure to have your sacral wound cleaned and that you are up and moving with physical therapy as well as eating a good amount of protein to help it heal!  We scheduled a follow up appointment with the Neurosurgery team to talk about your brace and your spinal fractures. Your appointment is 06/21/2024 at 12:45PM.   We are so glad that you are feeling better.  Sincerely, Maritta Kief, DO    Signed: Thinh Cuccaro, DO Internal Medicine Resident, PGY-1 06/14/2024, 2:04 PM   Please contact the on call pager after 5 pm and on weekends at [phone].

## 2024-06-14 NOTE — TOC Transition Note (Signed)
 Transition of Care [facility]) - Discharge Note   Patient Details  Name: Alexandria Harvey MRN: [id number] Date of Birth: May 23, 1981  [facility] CM/SW Contact:  Sherline Clack, LCSWA Phone Number: 06/14/2024, 3:15 PM   Clinical Narrative:     Patient will DC to: [facility] Anticipated DC date: 06/14/24  Family notified: Nat Settler (VM) Transport by: [facility]   Per MD patient ready for DC to [facility]. RN to call report prior to discharge [phone], rm [phone]). RN, patient, patient's family, and facility notified of DC. Discharge Summary and FL2 sent to facility. DC packet on chart. Ambulance transport requested for patient.   CSW will sign off for now as social work intervention is no longer needed. Please consult us  again if new needs arise.    Final next level of care: Skilled Nursing Facility Barriers to Discharge: Barriers Resolved   Patient Goals and CMS Choice            Discharge Placement              Patient chooses bed at: [facility] Patient to be transferred to facility by: [facility] Name of family member notified: Sherline Clack SILK; Pt's SSN: [id number] Patient and family notified of of transfer: 06/14/24  Discharge Plan and Services Additional resources added to the After Visit Summary for                                       Social Drivers of Health (SDOH) Interventions SDOH Screenings   Food Insecurity: No Food Insecurity (06/11/2024)  Housing: Low Risk  (06/11/2024)  Transportation Needs: No Transportation Needs (06/11/2024)  Utilities: Not At Risk (06/11/2024)  Financial Resource Strain: High Risk (02/29/2024)   Received from [facility]  Physical Activity: Unknown (02/29/2024)   Received from [facility]  Social Connections: Moderately Integrated (02/29/2024)   Received from [facility]  Stress: No Stress Concern Present (02/29/2024)   Received from  [facility]  Tobacco Use: Low Risk  (06/11/2024)     Readmission Risk Interventions    06/13/2024    9:35 AM  Readmission Risk Prevention Plan  Transportation Screening Complete  PCP or Specialist Appt within 5-7 Days Complete  Home Care Screening Complete  Medication Review (RN CM) Complete

## 2024-06-16 LAB — CULTURE, BLOOD (ROUTINE X 2): Culture: NO GROWTH

## 2024-06-17 LAB — CULTURE, BLOOD (ROUTINE X 2)
Culture: NO GROWTH
Culture: NO GROWTH
Special Requests: ADEQUATE

## 2024-07-18 ENCOUNTER — Other Ambulatory Visit (HOSPITAL_COMMUNITY): Payer: Self-pay

## 2024-07-18 DIAGNOSIS — L89154 Pressure ulcer of sacral region, stage 4: Secondary | ICD-10-CM

## 2024-07-21 ENCOUNTER — Other Ambulatory Visit: Payer: Self-pay | Admitting: Neurosurgery

## 2024-07-21 DIAGNOSIS — S22048D Other fracture of fourth thoracic vertebra, subsequent encounter for fracture with routine healing: Secondary | ICD-10-CM

## 2024-07-24 ENCOUNTER — Encounter: Payer: Self-pay | Admitting: Neurosurgery

## 2024-07-28 ENCOUNTER — Other Ambulatory Visit (HOSPITAL_COMMUNITY): Payer: Self-pay | Admitting: Neurosurgery

## 2024-07-28 DIAGNOSIS — S12291D Other nondisplaced fracture of third cervical vertebra, subsequent encounter for fracture with routine healing: Secondary | ICD-10-CM

## 2024-08-01 ENCOUNTER — Other Ambulatory Visit (HOSPITAL_COMMUNITY): Payer: Self-pay

## 2024-08-01 DIAGNOSIS — R52 Pain, unspecified: Secondary | ICD-10-CM

## 2024-08-02 ENCOUNTER — Encounter (HOSPITAL_COMMUNITY): Payer: Self-pay

## 2024-08-02 ENCOUNTER — Ambulatory Visit (HOSPITAL_COMMUNITY)

## 2024-08-02 ENCOUNTER — Ambulatory Visit (HOSPITAL_COMMUNITY): Admission: RE | Admit: 2024-08-02 | Source: Ambulatory Visit

## 2024-08-11 ENCOUNTER — Inpatient Hospital Stay (HOSPITAL_COMMUNITY)
Admission: EM | Admit: 2024-08-11 | Discharge: 2024-09-01 | DRG: 606 | Disposition: A | Discharge status: Skilled Nursing Facility | Attending: Family Medicine | Admitting: Family Medicine

## 2024-08-11 DIAGNOSIS — K592 Neurogenic bowel, not elsewhere classified: Secondary | ICD-10-CM | POA: Diagnosis present

## 2024-08-11 DIAGNOSIS — Z881 Allergy status to other antibiotic agents status: Secondary | ICD-10-CM

## 2024-08-11 DIAGNOSIS — Z9189 Other specified personal risk factors, not elsewhere classified: Secondary | ICD-10-CM

## 2024-08-11 DIAGNOSIS — L270 Generalized skin eruption due to drugs and medicaments taken internally: Principal | ICD-10-CM | POA: Diagnosis present

## 2024-08-11 DIAGNOSIS — Z7984 Long term (current) use of oral hypoglycemic drugs: Secondary | ICD-10-CM

## 2024-08-11 DIAGNOSIS — E669 Obesity, unspecified: Secondary | ICD-10-CM | POA: Diagnosis present

## 2024-08-11 DIAGNOSIS — Z6828 Body mass index (BMI) 28.0-28.9, adult: Secondary | ICD-10-CM

## 2024-08-11 DIAGNOSIS — Z91018 Allergy to other foods: Secondary | ICD-10-CM

## 2024-08-11 DIAGNOSIS — Z91013 Allergy to seafood: Secondary | ICD-10-CM

## 2024-08-11 DIAGNOSIS — E559 Vitamin D deficiency, unspecified: Secondary | ICD-10-CM | POA: Diagnosis present

## 2024-08-11 DIAGNOSIS — E119 Type 2 diabetes mellitus without complications: Secondary | ICD-10-CM | POA: Diagnosis present

## 2024-08-11 DIAGNOSIS — G8250 Quadriplegia, unspecified: Secondary | ICD-10-CM | POA: Diagnosis present

## 2024-08-11 DIAGNOSIS — N319 Neuromuscular dysfunction of bladder, unspecified: Secondary | ICD-10-CM | POA: Diagnosis present

## 2024-08-11 DIAGNOSIS — R6250 Unspecified lack of expected normal physiological development in childhood: Secondary | ICD-10-CM | POA: Diagnosis present

## 2024-08-11 DIAGNOSIS — E860 Dehydration: Secondary | ICD-10-CM | POA: Diagnosis present

## 2024-08-11 DIAGNOSIS — K219 Gastro-esophageal reflux disease without esophagitis: Secondary | ICD-10-CM | POA: Diagnosis present

## 2024-08-11 DIAGNOSIS — F411 Generalized anxiety disorder: Secondary | ICD-10-CM | POA: Diagnosis present

## 2024-08-11 DIAGNOSIS — Z79899 Other long term (current) drug therapy: Secondary | ICD-10-CM

## 2024-08-11 DIAGNOSIS — E872 Acidosis, unspecified: Secondary | ICD-10-CM | POA: Diagnosis present

## 2024-08-11 DIAGNOSIS — S22059A Unspecified fracture of T5-T6 vertebra, initial encounter for closed fracture: Secondary | ICD-10-CM | POA: Diagnosis present

## 2024-08-11 DIAGNOSIS — F819 Developmental disorder of scholastic skills, unspecified: Secondary | ICD-10-CM

## 2024-08-11 DIAGNOSIS — E871 Hypo-osmolality and hyponatremia: Secondary | ICD-10-CM | POA: Diagnosis present

## 2024-08-11 DIAGNOSIS — W1839XA Other fall on same level, initial encounter: Secondary | ICD-10-CM | POA: Diagnosis present

## 2024-08-11 DIAGNOSIS — Z833 Family history of diabetes mellitus: Secondary | ICD-10-CM

## 2024-08-11 DIAGNOSIS — F251 Schizoaffective disorder, depressive type: Secondary | ICD-10-CM | POA: Diagnosis present

## 2024-08-11 DIAGNOSIS — E785 Hyperlipidemia, unspecified: Secondary | ICD-10-CM | POA: Diagnosis present

## 2024-08-11 DIAGNOSIS — I358 Other nonrheumatic aortic valve disorders: Secondary | ICD-10-CM | POA: Diagnosis present

## 2024-08-11 DIAGNOSIS — Z7901 Long term (current) use of anticoagulants: Secondary | ICD-10-CM

## 2024-08-11 DIAGNOSIS — L89154 Pressure ulcer of sacral region, stage 4: Secondary | ICD-10-CM | POA: Diagnosis present

## 2024-08-11 DIAGNOSIS — R233 Spontaneous ecchymoses: Secondary | ICD-10-CM | POA: Diagnosis present

## 2024-08-11 DIAGNOSIS — F259 Schizoaffective disorder, unspecified: Secondary | ICD-10-CM

## 2024-08-11 DIAGNOSIS — G928 Other toxic encephalopathy: Secondary | ICD-10-CM | POA: Diagnosis present

## 2024-08-11 DIAGNOSIS — F70 Mild intellectual disabilities: Secondary | ICD-10-CM | POA: Diagnosis present

## 2024-08-11 DIAGNOSIS — S129XXA Fracture of neck, unspecified, initial encounter: Secondary | ICD-10-CM | POA: Diagnosis present

## 2024-08-11 DIAGNOSIS — S22009A Unspecified fracture of unspecified thoracic vertebra, initial encounter for closed fracture: Secondary | ICD-10-CM

## 2024-08-11 DIAGNOSIS — K5900 Constipation, unspecified: Secondary | ICD-10-CM | POA: Diagnosis present

## 2024-08-11 DIAGNOSIS — R252 Cramp and spasm: Secondary | ICD-10-CM

## 2024-08-11 DIAGNOSIS — Z888 Allergy status to other drugs, medicaments and biological substances status: Secondary | ICD-10-CM

## 2024-08-11 DIAGNOSIS — A419 Sepsis, unspecified organism: Principal | ICD-10-CM | POA: Diagnosis present

## 2024-08-11 DIAGNOSIS — I82413 Acute embolism and thrombosis of femoral vein, bilateral: Secondary | ICD-10-CM | POA: Diagnosis present

## 2024-08-11 DIAGNOSIS — Z7952 Long term (current) use of systemic steroids: Secondary | ICD-10-CM

## 2024-08-11 DIAGNOSIS — I10 Essential (primary) hypertension: Secondary | ICD-10-CM | POA: Diagnosis present

## 2024-08-12 ENCOUNTER — Emergency Department (HOSPITAL_COMMUNITY)

## 2024-08-12 ENCOUNTER — Inpatient Hospital Stay (HOSPITAL_COMMUNITY)

## 2024-08-12 ENCOUNTER — Encounter (HOSPITAL_COMMUNITY): Payer: Self-pay | Admitting: Internal Medicine

## 2024-08-12 ENCOUNTER — Other Ambulatory Visit: Payer: Self-pay

## 2024-08-12 DIAGNOSIS — R233 Spontaneous ecchymoses: Secondary | ICD-10-CM | POA: Diagnosis present

## 2024-08-12 DIAGNOSIS — R609 Edema, unspecified: Secondary | ICD-10-CM | POA: Diagnosis not present

## 2024-08-12 DIAGNOSIS — I10 Essential (primary) hypertension: Secondary | ICD-10-CM | POA: Diagnosis present

## 2024-08-12 DIAGNOSIS — I82413 Acute embolism and thrombosis of femoral vein, bilateral: Secondary | ICD-10-CM | POA: Diagnosis present

## 2024-08-12 DIAGNOSIS — E872 Acidosis, unspecified: Secondary | ICD-10-CM

## 2024-08-12 DIAGNOSIS — I82403 Acute embolism and thrombosis of unspecified deep veins of lower extremity, bilateral: Secondary | ICD-10-CM | POA: Diagnosis not present

## 2024-08-12 DIAGNOSIS — F819 Developmental disorder of scholastic skills, unspecified: Secondary | ICD-10-CM

## 2024-08-12 DIAGNOSIS — W1839XA Other fall on same level, initial encounter: Secondary | ICD-10-CM | POA: Diagnosis present

## 2024-08-12 DIAGNOSIS — Z7984 Long term (current) use of oral hypoglycemic drugs: Secondary | ICD-10-CM | POA: Diagnosis not present

## 2024-08-12 DIAGNOSIS — Z9189 Other specified personal risk factors, not elsewhere classified: Secondary | ICD-10-CM | POA: Diagnosis not present

## 2024-08-12 DIAGNOSIS — E559 Vitamin D deficiency, unspecified: Secondary | ICD-10-CM | POA: Diagnosis present

## 2024-08-12 DIAGNOSIS — S129XXA Fracture of neck, unspecified, initial encounter: Secondary | ICD-10-CM

## 2024-08-12 DIAGNOSIS — Z760 Encounter for issue of repeat prescription: Secondary | ICD-10-CM | POA: Diagnosis present

## 2024-08-12 DIAGNOSIS — A419 Sepsis, unspecified organism: Secondary | ICD-10-CM | POA: Diagnosis present

## 2024-08-12 DIAGNOSIS — K5900 Constipation, unspecified: Secondary | ICD-10-CM | POA: Diagnosis present

## 2024-08-12 DIAGNOSIS — L89159 Pressure ulcer of sacral region, unspecified stage: Secondary | ICD-10-CM | POA: Diagnosis not present

## 2024-08-12 DIAGNOSIS — I38 Endocarditis, valve unspecified: Secondary | ICD-10-CM | POA: Diagnosis not present

## 2024-08-12 DIAGNOSIS — L270 Generalized skin eruption due to drugs and medicaments taken internally: Secondary | ICD-10-CM

## 2024-08-12 DIAGNOSIS — R6250 Unspecified lack of expected normal physiological development in childhood: Secondary | ICD-10-CM | POA: Diagnosis present

## 2024-08-12 DIAGNOSIS — F70 Mild intellectual disabilities: Secondary | ICD-10-CM | POA: Diagnosis present

## 2024-08-12 DIAGNOSIS — F79 Unspecified intellectual disabilities: Secondary | ICD-10-CM

## 2024-08-12 DIAGNOSIS — F259 Schizoaffective disorder, unspecified: Secondary | ICD-10-CM | POA: Diagnosis not present

## 2024-08-12 DIAGNOSIS — E119 Type 2 diabetes mellitus without complications: Secondary | ICD-10-CM | POA: Diagnosis present

## 2024-08-12 DIAGNOSIS — E785 Hyperlipidemia, unspecified: Secondary | ICD-10-CM | POA: Diagnosis present

## 2024-08-12 DIAGNOSIS — S22009D Unspecified fracture of unspecified thoracic vertebra, subsequent encounter for fracture with routine healing: Secondary | ICD-10-CM | POA: Diagnosis not present

## 2024-08-12 DIAGNOSIS — S22059A Unspecified fracture of T5-T6 vertebra, initial encounter for closed fracture: Secondary | ICD-10-CM | POA: Diagnosis present

## 2024-08-12 DIAGNOSIS — I358 Other nonrheumatic aortic valve disorders: Secondary | ICD-10-CM | POA: Diagnosis not present

## 2024-08-12 DIAGNOSIS — E871 Hypo-osmolality and hyponatremia: Secondary | ICD-10-CM | POA: Diagnosis present

## 2024-08-12 DIAGNOSIS — S22009A Unspecified fracture of unspecified thoracic vertebra, initial encounter for closed fracture: Secondary | ICD-10-CM

## 2024-08-12 DIAGNOSIS — E669 Obesity, unspecified: Secondary | ICD-10-CM | POA: Diagnosis present

## 2024-08-12 DIAGNOSIS — F251 Schizoaffective disorder, depressive type: Secondary | ICD-10-CM | POA: Diagnosis present

## 2024-08-12 DIAGNOSIS — G8250 Quadriplegia, unspecified: Secondary | ICD-10-CM | POA: Diagnosis present

## 2024-08-12 DIAGNOSIS — G9340 Encephalopathy, unspecified: Secondary | ICD-10-CM | POA: Diagnosis not present

## 2024-08-12 DIAGNOSIS — Z79899 Other long term (current) drug therapy: Secondary | ICD-10-CM | POA: Diagnosis not present

## 2024-08-12 DIAGNOSIS — M7989 Other specified soft tissue disorders: Secondary | ICD-10-CM | POA: Diagnosis not present

## 2024-08-12 DIAGNOSIS — F250 Schizoaffective disorder, bipolar type: Secondary | ICD-10-CM | POA: Diagnosis not present

## 2024-08-12 DIAGNOSIS — D72829 Elevated white blood cell count, unspecified: Secondary | ICD-10-CM | POA: Diagnosis not present

## 2024-08-12 DIAGNOSIS — F411 Generalized anxiety disorder: Secondary | ICD-10-CM | POA: Diagnosis present

## 2024-08-12 DIAGNOSIS — E860 Dehydration: Secondary | ICD-10-CM | POA: Diagnosis present

## 2024-08-12 DIAGNOSIS — G928 Other toxic encephalopathy: Secondary | ICD-10-CM | POA: Diagnosis present

## 2024-08-12 DIAGNOSIS — K592 Neurogenic bowel, not elsewhere classified: Secondary | ICD-10-CM | POA: Diagnosis present

## 2024-08-12 DIAGNOSIS — G9349 Other encephalopathy: Secondary | ICD-10-CM | POA: Diagnosis not present

## 2024-08-12 DIAGNOSIS — K219 Gastro-esophageal reflux disease without esophagitis: Secondary | ICD-10-CM | POA: Diagnosis present

## 2024-08-12 DIAGNOSIS — L89154 Pressure ulcer of sacral region, stage 4: Secondary | ICD-10-CM | POA: Diagnosis present

## 2024-08-12 DIAGNOSIS — N319 Neuromuscular dysfunction of bladder, unspecified: Secondary | ICD-10-CM | POA: Diagnosis present

## 2024-08-12 DIAGNOSIS — R252 Cramp and spasm: Secondary | ICD-10-CM | POA: Diagnosis not present

## 2024-08-12 DIAGNOSIS — Z7901 Long term (current) use of anticoagulants: Secondary | ICD-10-CM | POA: Diagnosis not present

## 2024-08-12 DIAGNOSIS — L8944 Pressure ulcer of contiguous site of back, buttock and hip, stage 4: Secondary | ICD-10-CM

## 2024-08-12 DIAGNOSIS — S129XXD Fracture of neck, unspecified, subsequent encounter: Secondary | ICD-10-CM | POA: Diagnosis not present

## 2024-08-12 LAB — CBC WITH DIFFERENTIAL/PLATELET
Basophils Absolute: 0.3 K/uL — ABNORMAL HIGH (ref 0.0–0.1)
Basophils Relative: 2 %
Eosinophils Absolute: 0 K/uL (ref 0.0–0.5)
Eosinophils Relative: 0 %
HCT: 30.3 % — ABNORMAL LOW (ref 36.0–46.0)
Hemoglobin: 9.6 g/dL — ABNORMAL LOW (ref 12.0–15.0)
Lymphocytes Relative: 40 %
Lymphs Abs: 5.7 K/uL — ABNORMAL HIGH (ref 0.7–4.0)
MCH: 25.5 pg — ABNORMAL LOW (ref 26.0–34.0)
MCHC: 31.7 g/dL (ref 30.0–36.0)
MCV: 80.4 fL (ref 80.0–100.0)
Monocytes Absolute: 2.1 K/uL — ABNORMAL HIGH (ref 0.1–1.0)
Monocytes Relative: 15 %
Neutro Abs: 6.1 K/uL (ref 1.7–7.7)
Neutrophils Relative %: 43 %
Platelets: 278 K/uL (ref 150–400)
RBC: 3.77 MIL/uL — ABNORMAL LOW (ref 3.87–5.11)
RDW: 19 % — ABNORMAL HIGH (ref 11.5–15.5)
WBC: 14.3 K/uL — ABNORMAL HIGH (ref 4.0–10.5)
nRBC: 0.3 % — ABNORMAL HIGH (ref 0.0–0.2)

## 2024-08-12 LAB — URINALYSIS, ROUTINE W REFLEX MICROSCOPIC
Bilirubin Urine: NEGATIVE
Glucose, UA: NEGATIVE mg/dL
Hgb urine dipstick: NEGATIVE
Ketones, ur: 5 mg/dL — AB
Leukocytes,Ua: NEGATIVE
Nitrite: NEGATIVE
Protein, ur: NEGATIVE mg/dL
Specific Gravity, Urine: 1.014 (ref 1.005–1.030)
pH: 6 (ref 5.0–8.0)

## 2024-08-12 LAB — COMPREHENSIVE METABOLIC PANEL WITH GFR
ALT: 15 U/L (ref 0–44)
AST: 24 U/L (ref 15–41)
Albumin: 2 g/dL — ABNORMAL LOW (ref 3.5–5.0)
Alkaline Phosphatase: 47 U/L (ref 38–126)
Anion gap: 15 (ref 5–15)
BUN: 10 mg/dL (ref 6–20)
CO2: 22 mmol/L (ref 22–32)
Calcium: 8.7 mg/dL — ABNORMAL LOW (ref 8.9–10.3)
Chloride: 100 mmol/L (ref 98–111)
Creatinine, Ser: 0.46 mg/dL (ref 0.44–1.00)
GFR, Estimated: >60 mL/min (ref >60)
Glucose, Bld: 129 mg/dL — ABNORMAL HIGH (ref 70–99)
Potassium: 3.6 mmol/L (ref 3.5–5.1)
Sodium: 137 mmol/L (ref 135–145)
Total Bilirubin: <0.2 mg/dL (ref 0.0–1.2)
Total Protein: 5.5 g/dL — ABNORMAL LOW (ref 6.5–8.1)

## 2024-08-12 LAB — ECHOCARDIOGRAM COMPLETE
AR max vel: 2.27 cm2
AV Peak grad: 10.5 mmHg
Ao pk vel: 1.62 m/s
Area-P 1/2: 4.21 cm2
Calc EF: 59.8 %
Height: 62 in
S' Lateral: 3.1 cm
Single Plane A2C EF: 59 %
Single Plane A4C EF: 62.5 %
Weight: 2476.21 [oz_av]

## 2024-08-12 LAB — LACTIC ACID, PLASMA
Lactic Acid, Venous: 4.1 mmol/L (ref 0.5–1.9)
Lactic Acid, Venous: 3.8 mmol/L (ref 0.5–1.9)

## 2024-08-12 LAB — I-STAT CG4 LACTIC ACID, ED
Lactic Acid, Venous: 5.1 mmol/L (ref 0.5–1.9)
Lactic Acid, Venous: 5.6 mmol/L (ref 0.5–1.9)

## 2024-08-12 LAB — VALPROIC ACID LEVEL: Valproic Acid Lvl: 41 ug/mL — ABNORMAL LOW (ref 50–100)

## 2024-08-12 LAB — PROTIME-INR
INR: 1.4 — ABNORMAL HIGH (ref 0.8–1.2)
Prothrombin Time: 17.7 s — ABNORMAL HIGH (ref 11.4–15.2)

## 2024-08-12 MED ORDER — LINEZOLID 600 MG/300ML IV SOLN
600.0000 mg | Freq: Once | INTRAVENOUS | Status: AC
  Administered 2024-08-12: 600 mg via INTRAVENOUS
  Filled 2024-08-12: qty 300

## 2024-08-12 MED ORDER — LACTATED RINGERS IV SOLN: INTRAVENOUS | Status: AC

## 2024-08-12 MED ORDER — IOHEXOL 350 MG/ML SOLN
75.0000 mL | Freq: Once | INTRAVENOUS | Status: AC | PRN
  Administered 2024-08-12: 75 mL via INTRAVENOUS

## 2024-08-12 MED ORDER — LACTATED RINGERS IV BOLUS (SEPSIS)
250.0000 mL | Freq: Once | INTRAVENOUS | Status: AC
  Administered 2024-08-12: 250 mL via INTRAVENOUS

## 2024-08-12 MED ORDER — DIVALPROEX SODIUM 250 MG PO DR TAB: 750.0000 mg | DELAYED_RELEASE_TABLET | Freq: Three times a day (TID) | ORAL | Status: DC

## 2024-08-12 MED ORDER — OXYCODONE HCL 5 MG PO TABS: 5.0000 mg | ORAL_TABLET | ORAL | Status: DC | PRN

## 2024-08-12 MED ORDER — SODIUM CHLORIDE 0.9 % IV SOLN: 2.0000 g | Freq: Three times a day (TID) | INTRAVENOUS | Status: DC

## 2024-08-12 MED ORDER — PERFLUTREN LIPID MICROSPHERE
1.0000 mL | INTRAVENOUS | Status: AC | PRN
  Administered 2024-08-12: 2 mL via INTRAVENOUS

## 2024-08-12 MED ORDER — GABAPENTIN 300 MG PO CAPS: 300.0000 mg | ORAL_CAPSULE | Freq: Three times a day (TID) | ORAL | Status: DC

## 2024-08-12 MED ORDER — GABAPENTIN 100 MG PO CAPS
100.0000 mg | ORAL_CAPSULE | Freq: Three times a day (TID) | ORAL | Status: DC
  Administered 2024-08-12 – 2024-09-01 (×61): 100 mg via ORAL
  Filled 2024-08-12 (×62): qty 1

## 2024-08-12 MED ORDER — CLOZAPINE 100 MG PO TABS: 100.0000 mg | ORAL_TABLET | Freq: Two times a day (BID) | ORAL | Status: DC

## 2024-08-12 MED ORDER — ENOXAPARIN SODIUM 40 MG/0.4ML IJ SOSY: 40.0000 mg | PREFILLED_SYRINGE | INTRAMUSCULAR | Status: DC

## 2024-08-12 MED ORDER — METRONIDAZOLE 500 MG/100ML IV SOLN
500.0000 mg | Freq: Once | INTRAVENOUS | Status: AC
  Administered 2024-08-12: 500 mg via INTRAVENOUS
  Filled 2024-08-12: qty 100

## 2024-08-12 MED ORDER — CLOZAPINE 100 MG PO TABS
50.0000 mg | ORAL_TABLET | Freq: Two times a day (BID) | ORAL | Status: DC
  Administered 2024-08-12 – 2024-09-01 (×41): 50 mg via ORAL
  Filled 2024-08-12 (×10): qty 1
  Filled 2024-08-12: qty 2
  Filled 2024-08-12 (×3): qty 1
  Filled 2024-08-12: qty 2
  Filled 2024-08-12 (×27): qty 1

## 2024-08-12 MED ORDER — SENNOSIDES-DOCUSATE SODIUM 8.6-50 MG PO TABS
1.0000 | ORAL_TABLET | Freq: Every day | ORAL | Status: DC
  Administered 2024-08-12 – 2024-09-01 (×14): 1 via ORAL
  Filled 2024-08-12 (×17): qty 1

## 2024-08-12 MED ORDER — METRONIDAZOLE 500 MG/100ML IV SOLN: 500.0000 mg | Freq: Two times a day (BID) | INTRAVENOUS | Status: DC

## 2024-08-12 MED ORDER — BUSPIRONE HCL 10 MG PO TABS
5.0000 mg | ORAL_TABLET | Freq: Three times a day (TID) | ORAL | Status: DC
  Administered 2024-08-12: 5 mg via ORAL
  Filled 2024-08-12 (×2): qty 1

## 2024-08-12 MED ORDER — SODIUM CHLORIDE 0.9 % IV SOLN
2.0000 g | Freq: Once | INTRAVENOUS | Status: AC
  Administered 2024-08-12: 2 g via INTRAVENOUS
  Filled 2024-08-12: qty 12.5

## 2024-08-12 MED ORDER — BENZTROPINE MESYLATE 1 MG PO TABS
1.0000 mg | ORAL_TABLET | Freq: Two times a day (BID) | ORAL | Status: DC
  Filled 2024-08-12: qty 1

## 2024-08-12 MED ORDER — ATENOLOL 25 MG PO TABS: 50.0000 mg | ORAL_TABLET | Freq: Every day | ORAL | Status: DC

## 2024-08-12 MED ORDER — DIVALPROEX SODIUM 250 MG PO DR TAB
250.0000 mg | DELAYED_RELEASE_TABLET | Freq: Three times a day (TID) | ORAL | Status: DC
  Administered 2024-08-12 – 2024-09-01 (×62): 250 mg via ORAL
  Filled 2024-08-12 (×64): qty 1

## 2024-08-12 MED ORDER — ONDANSETRON HCL 4 MG/2ML IJ SOLN: 4.0000 mg | Freq: Four times a day (QID) | INTRAMUSCULAR | Status: DC | PRN

## 2024-08-12 MED ORDER — ACETAMINOPHEN 650 MG RE SUPP: 650.0000 mg | Freq: Four times a day (QID) | RECTAL | Status: DC | PRN

## 2024-08-12 MED ORDER — POLYETHYLENE GLYCOL 3350 17 G PO PACK
17.0000 g | PACK | Freq: Two times a day (BID) | ORAL | Status: DC
  Administered 2024-08-12 – 2024-08-24 (×18): 17 g via ORAL
  Filled 2024-08-12 (×27): qty 1

## 2024-08-12 MED ORDER — ACETAMINOPHEN 325 MG PO TABS
650.0000 mg | ORAL_TABLET | Freq: Four times a day (QID) | ORAL | Status: DC | PRN
  Administered 2024-08-13 – 2024-08-23 (×4): 650 mg via ORAL
  Filled 2024-08-12 (×5): qty 2

## 2024-08-12 MED ORDER — ONDANSETRON HCL 4 MG PO TABS: 4.0000 mg | ORAL_TABLET | Freq: Four times a day (QID) | ORAL | Status: DC | PRN

## 2024-08-12 MED ORDER — CLOZAPINE 25 MG PO TABS
25.0000 mg | ORAL_TABLET | Freq: Two times a day (BID) | ORAL | Status: DC
  Filled 2024-08-12: qty 1

## 2024-08-12 MED ORDER — EZETIMIBE 10 MG PO TABS: 10.0000 mg | ORAL_TABLET | Freq: Every day | ORAL | Status: DC

## 2024-08-12 MED ORDER — LACTATED RINGERS IV BOLUS (SEPSIS)
1000.0000 mL | Freq: Once | INTRAVENOUS | Status: AC
  Administered 2024-08-12: 1000 mL via INTRAVENOUS

## 2024-08-12 MED ORDER — ATORVASTATIN CALCIUM 40 MG PO TABS: 80.0000 mg | ORAL_TABLET | Freq: Every day | ORAL | Status: DC

## 2024-08-12 MED ORDER — COLLAGENASE 250 UNIT/GM EX OINT
TOPICAL_OINTMENT | Freq: Every day | CUTANEOUS | Status: DC
  Filled 2024-08-12 (×3): qty 30

## 2024-08-12 MED ORDER — LINEZOLID 600 MG/300ML IV SOLN: 600.0000 mg | Freq: Two times a day (BID) | INTRAVENOUS | Status: DC

## 2024-08-12 MED ORDER — APIXABAN 5 MG PO TABS
5.0000 mg | ORAL_TABLET | Freq: Two times a day (BID) | ORAL | Status: DC
  Administered 2024-08-12 – 2024-09-01 (×41): 5 mg via ORAL
  Filled 2024-08-12 (×42): qty 1

## 2024-08-12 MED ORDER — OXYCODONE HCL 5 MG PO TABS
2.5000 mg | ORAL_TABLET | ORAL | Status: DC | PRN
  Administered 2024-08-12 – 2024-08-22 (×11): 2.5 mg via ORAL
  Filled 2024-08-12 (×11): qty 1

## 2024-08-12 MED ORDER — DIPHENHYDRAMINE-ZINC ACETATE 2-0.1 % EX CREA
TOPICAL_CREAM | Freq: Two times a day (BID) | CUTANEOUS | Status: DC
  Filled 2024-08-12 (×2): qty 28

## 2024-08-12 NOTE — ED Notes (Signed)
 Patient transported to Ultrasound

## 2024-08-12 NOTE — ED Notes (Signed)
 Patient transported to CT

## 2024-08-12 NOTE — ED Provider Triage Note (Signed)
 Emergency Medicine Provider Triage Evaluation Note  Alexandria Harvey , a 43 y.o. female  was evaluated in triage.  Pt complains of new rash to arms and legs, from facility, very limited history available at this time. Known sacral decubitus. Now with non blanching purpura to arms and legs. On antibiotics.   Review of Systems  Positive:  Negative:   Physical Exam  BP 137/84 (BP Location: Right Arm)   Pulse 97   Temp 98.7 F (37.1 C) (Oral)   Resp 14   SpO2 95%  Gen:   Awake, no distress   Resp:  Normal effort  MSK:   Moves extremities without difficulty  Other:  Non blanching rash to arms and legs   Medical Decision Making  Medically screening exam initiated at 12:25 AM.  Appropriate orders placed.  Alexandria Harvey was informed that the remainder of the evaluation will be completed by another provider, this initial triage assessment does not replace that evaluation, and the importance of remaining in the ED until their evaluation is complete.  Requested room for patient    Alexandria Harvey DELENA Harvey 08/12/24 9973

## 2024-08-12 NOTE — Sepsis Progress Note (Signed)
 Notified provider of need to order repeat lactic acid.

## 2024-08-12 NOTE — ED Triage Notes (Signed)
 Pt arrives via [facility] from [facility] for a rash that is getting worse per facility. Per EMS, Pt was on vanco for 3 weeks and they noted a rash that appeared on Monday. Vanco was stopped on Monday and the rash was noted to have spread as of Thursday. Pt also presented with increased AMS from baseline yesterday, but is at baseline today. Per [facility], pt WBC was low which prompted call to EMS to bring in for eval. Pt has an unstageable sacral wound and a PICC in her LUE. Per EMS pt wearing C-collar d/t repeated falls? EMS gave 650mg  Tylenol  en route because she felt warm. Pt AAO x1 and presents with notable nonblanchable purpura.   EMS Vitals:  138/84 96 HR  93%-95% RA  138 CBG  98.7 Temp

## 2024-08-12 NOTE — H&P (Signed)
 History and Physical    Alexandria Harvey FMW:[id number] DOB: 1981-05-15 DOA: 08/11/2024  PCP: [facility]   Chief Complaint:  lorre  HPI: Alexandria Harvey is a 43 y.o. female with medical history significant of with history of diabetes, GERD, schizophrenia, hypertension, hyperlipidemia, disability who to the emergency department due to a rash patient was recently discharged from the hospital on 10/8.  During this presentation she was found to be febrile and altered.  She initially had a lactic acidosis and was volume resuscitated and treated with IV antibiotics.  During his hospitalization she had a sacral wound without evidence of infection.  She also had a recent fall in September which resulted in spinal cord injury.  On presentation to the ER she was found to be on vancomycin  which she been taking for 3 weeks and reportedly developed a rash.  This was discontinued and her antibiotics were transitioned to Levaquin and doxycycline.  There was concern that she was altered and not acting her usual self so she was brought to the ER for further assessment.  On evaluation she had rash on bilateral lower extremities that appeared petechial in nature.  Labs were obtained which showed WBC 14.3, hemoglobin 9.6, CMP unrevealing, INR 1.4, blood cultures pending, lactic acid 5.6, 5.1, urinalysis negative for infection.  Chest x-ray showed left PICC line.  CT abdomen pelvis showed grade 4 sacral decubitus ulcer without evidence of osteomyelitis.  There is also large stool burden.  Patient was transitioned to cefepime , Flagyl , linezolid .  Mission patient was largely noncontributory to presentation.  She had a noted rash.  She was resting comfortably.  Review of Systems: Review of Systems  All other systems reviewed and are negative.    As per HPI otherwise 10 point review of systems negative.   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 She has never used smokeless tobacco. She reports that she does not drink alcohol and does not use drugs.  Family History  Problem Relation Age of Onset   Diabetes Mellitus II Mother    Cancer Father     Prior to Admission medications   Medication Sig Start Date End Date Taking? Authorizing Provider  acetaminophen  (TYLENOL ) 500 MG tablet Take 2 tablets (1,000 mg total) by mouth every 8 (eight) hours as needed. 05/29/24  Yes Maczis, Michael M, PA-C  Amino Acids-Protein Hydrolys (FEEDING SUPPLEMENT, PRO-STAT SUGAR FREE 64,) LIQD Take 30 mLs by mouth in the morning and at bedtime.   Yes [provider]  apixaban  (ELIQUIS ) 5 MG TABS tablet Take 5 mg by mouth 2 (two) times daily. For 3 months   Yes [provider]  atenolol  (TENORMIN ) 50 MG tablet Take 1 tablet (50 mg total) by mouth daily. Patient  taking differently: Take 75 mg by mouth daily. 01/25/15  Yes Ragena Odor, MD  atorvastatin  (LIPITOR ) 80 MG tablet Take 1 tablet (80 mg total) by mouth at bedtime. 01/25/15  Yes Ragena Odor, MD  benztropine  (COGENTIN ) 1 MG tablet Take 1 mg by mouth 2 (two) times daily.   Yes [provider]  busPIRone  (BUSPAR ) 5 MG tablet Take 5 mg by mouth 3 (three) times daily.   Yes [provider]  calcium -vitamin D  (OSCAL WITH D) 500-5 MG-MCG tablet Take 1 tablet by mouth 2 (two) times  daily.   Yes [provider]  cloZAPine  (CLOZARIL ) 100 MG tablet Take 100 mg by mouth 2 (two) times daily.    Yes [provider]  divalproex  (DEPAKOTE ) 250 MG DR tablet Take 3 tablets (750 mg total) by mouth 3 (three) times daily. 06/14/24 08/12/25 Yes Amilibia, Jaden, DO  ezetimibe  (ZETIA ) 10 MG tablet Take 10 mg by mouth daily. 05/09/24  Yes [provider]  gabapentin  (NEURONTIN ) 300 MG capsule Take 1 capsule (300 mg total) by mouth 3 (three) times daily. 05/29/24  Yes Maczis, Michael M, PA-C  lubiprostone  (AMITIZA ) 24 MCG capsule Take 1 capsule (24 mcg total) by mouth daily with breakfast. 05/30/24  Yes Maczis, Michael M, PA-C  metFORMIN  (GLUCOPHAGE ) 500 MG tablet Take 1,000 mg by mouth 2 (two) times daily with a meal.   Yes [provider]  methocarbamol  (ROBAXIN ) 500 MG tablet Take 2 tablets (1,000 mg total) by mouth every 8 (eight) hours as needed for muscle spasms. 05/29/24  Yes Maczis, Michael M, PA-C  omeprazole (PRILOSEC) 20 MG capsule Take 20 mg by mouth in the morning and at bedtime.   Yes [provider]  polyethylene glycol (MIRALAX  / GLYCOLAX ) packet Take 17 g by mouth 2 (two) times daily. 06/29/17  Yes Armbruster, Elspeth SQUIBB, MD  predniSONE (DELTASONE) 20 MG tablet Take 20 mg by mouth daily with breakfast. For 5 days   Yes [provider]  sennosides-docusate sodium  (SENOKOT-S) 8.6-50 MG tablet Take 1 tablet by mouth daily.   Yes [provider]  simethicone  (MYLICON) 80 MG chewable tablet Chew 80 mg by mouth 2 (two) times daily.   Yes [provider]  Vitamin D , Ergocalciferol , (DRISDOL ) 1.25 MG (50000 UNIT) CAPS capsule Take 1 capsule (50,000 Units total) by mouth once a week. Patient taking differently: Take 50,000 Units by mouth once a week. Thursdays 06/03/24  Yes Maczis, Michael M, PA-C  zinc  oxide (BALMEX) 11.3 % CREA cream Apply 1 Application topically as needed.   Yes [provider]  collagenase   (SANTYL ) 250 UNIT/GM ointment Apply topically daily. Patient not taking: Reported on 08/12/2024 06/15/24   Amilibia, Jaden, DO  ferrous sulfate  325 (65 FE) MG tablet Take 1 tablet (325 mg total) by mouth daily with breakfast. Patient not taking: Reported on 08/12/2024 09/15/19 06/11/24  Krishnan, Gokul, MD  lisinopril  (PRINIVIL ,ZESTRIL ) 2.5 MG tablet Take 1 tablet (2.5 mg total) by mouth daily. Patient not taking: Reported on 06/11/2024 01/25/15   Ragena Odor, MD  naloxone  (NARCAN ) 0.4 MG/ML injection Inject 1 mL (0.4 mg total) into the vein as needed. Patient not taking: Reported on 06/11/2024 05/29/24   Maczis, Michael M, PA-C  oxyCODONE  (OXY IR/ROXICODONE ) 5 MG immediate release tablet Take 0.5-1 tablets (2.5-5 mg total) by mouth every 6 (six) hours as needed for breakthrough pain (2.5 mg for moderate, 5 mg for severe). Patient not taking: Reported on 08/12/2024 05/29/24   Maczis, Michael M, PA-C  piperacillin -tazobactam (ZOSYN ) 3.375 GM/50ML IVPB Inject 3.375 g into the vein every 8 (eight) hours. For 6 weeks Patient not taking: Reported on 08/12/2024    [provider]    Physical Exam: Vitals:   08/12/24 0300 08/12/24 0430 08/12/24 0515 08/12/24 0600  BP:  124/71 138/77 107/70  Pulse:  82 85 77  Resp:  13 10 12   Temp: 100 F (37.8 C)     TempSrc: Rectal     SpO2:  100% 100% 98%   Physical Exam Constitutional:      Appearance: She is toxic-appearing.  HENT:     Head: Normocephalic.     Nose: Nose normal.     Mouth/Throat:     Mouth: Mucous membranes are moist.     Pharynx: Oropharynx is clear.  Eyes:     Extraocular Movements: Extraocular movements intact.     Conjunctiva/sclera: Conjunctivae normal.     Pupils: Pupils are equal, round, and reactive to light.  Cardiovascular:     Rate and Rhythm: Regular rhythm.     Pulses: Normal pulses.     Heart sounds: Normal heart sounds.  Pulmonary:     Effort: Pulmonary effort is normal.     Breath sounds: Normal  breath sounds.  Abdominal:     General: Abdomen is flat.  Musculoskeletal:        General: No swelling. Normal range of motion.     Cervical back: Normal range of motion.  Skin:    General: Skin is warm.     Capillary Refill: Capillary refill takes less than 2 seconds.  Neurological:     General: No focal deficit present.     Mental Status: Mental status is at baseline.  Psychiatric:        Mood and Affect: Mood normal.        Labs on Admission: I have personally reviewed the patients's labs and imaging studies.  Assessment/Plan Principal Problem:   Sepsis (HCC)   # Sepsis most likely secondary to sacral decubitus ulcer versus PICC line infection # Acute on chronic encephalopathy most likely related to infection  - Patient found to have lactic acidosis and given IV fluid resuscitation - Started on linezolid , cefepime , Flagyl  - Blood cultures pending - PICC line site does not appear to be infected - Sacral decubitus ulcer has surrounding erythema  Plan: Continue linezolid , cefepime , Flagyl  due to likely vancomycin  allergy Continue IV fluids Trend lactic acid Follow-up blood cultures Did not remove PICC line pending blood cultures however would have low threshold to remove in setting of sepsis  # Stage IV sacral decubitus ulcer-consult wound.  Will continue antibiotics  # Schizophrenia/history of seizure-continue Depakote , gabapentin , benztropine , clozapine   # Generalized anxiety-continue BuSpar   # Hypertension-continue atenolol   # A-fib-continue Eliquis   # History of central spinal cord injury-continue to monitor  # Type 2 diabetes-patient's not on insulin  at home  Admission status: Emergency   Certification: The appropriate patient status for this patient is INPATIENT. Inpatient status is judged to be reasonable and necessary in order to provide the required intensity of service to ensure the patient's safety. The patient's presenting symptoms, physical exam  findings, and initial radiographic and laboratory data in the context of their chronic comorbidities is felt to place them at high risk for further clinical deterioration. Furthermore, it is not anticipated that the patient will be medically stable for discharge from the hospital within 2 midnights of admission.   * I certify that at the point of admission  it is my clinical judgment that the patient will require inpatient hospital care spanning beyond 2 midnights from the point of admission due to high intensity of service, high risk for further deterioration and high frequency of surveillance required.DEWAINE Lamar Dess MD [facility] If 7PM-7AM, please contact night-coverage [email or web address]  08/12/2024, 6:15 AM

## 2024-08-12 NOTE — ED Notes (Signed)
 Nat Duos with [place] [employer] - Assigned at legal guardian to patient - [phone]

## 2024-08-12 NOTE — ED Notes (Signed)
 Patient remains in vascular. Labs remain delayed.

## 2024-08-12 NOTE — ED Notes (Signed)
 Unstageable wound to sacrum assessed and dressed by Triage RN.

## 2024-08-12 NOTE — ED Provider Notes (Signed)
 [place] EMERGENCY DEPARTMENT AT [facility] Provider Note   CSN: [id number] Arrival date & time: 08/11/24  2343     Patient presents with: Rash   Alexandria Harvey is a 43 y.o. female.   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  Per EMS patient was on vancomycin  for 3 weeks.  Rash was noted on Monday.  Vancomycin  was stopped but rash continued to spread.  She is at [facility] center and was noted to have increased altered mental status yesterday but returned to her baseline today.  They got some lab work and noted her white blood cell count to be low.  This prompted transfer to the ED.  No reported fevers.  Patient has a guardian.  She is oriented to herself and does not provide much history.  Prior to Admission medications   Medication Sig Start Date End Date Taking? Authorizing Provider  acetaminophen  (TYLENOL ) 500 MG tablet Take 2 tablets (1,000 mg total) by mouth every 8 (eight) hours as needed. 05/29/24   Maczis, Michael M, PA-C  atenolol  (TENORMIN ) 50 MG tablet Take 1 tablet (50 mg total) by mouth daily. Patient not taking: Reported on 06/11/2024 01/25/15   Ragena Odor, MD  atorvastatin  (LIPITOR ) 80 MG tablet Take 1 tablet (80 mg total) by mouth at bedtime. 01/25/15   Ragena Odor, MD  benztropine  (COGENTIN ) 1 MG tablet Take 1 mg by mouth 2 (two) times daily.    [provider]  Calcium  Carb-Cholecalciferol (OYSTER SHELL CALCIUM  W/D) 500-5 MG-MCG TABS Take 1 tablet by mouth 2 (two) times daily. 05/03/24   [provider]  cloZAPine  (CLOZARIL ) 100 MG tablet Take 100 mg by mouth 2 (two) times daily.     [provider]  collagenase  (SANTYL ) 250 UNIT/GM ointment Apply topically daily. 06/15/24   Amilibia, Jaden, DO  divalproex  (DEPAKOTE ) 250 MG DR tablet Take 3 tablets (750 mg total)  by mouth 3 (three) times daily. 06/14/24 07/14/24  Amilibia, Jaden, DO  ezetimibe  (ZETIA ) 10 MG tablet Take 10 mg by mouth daily. 05/09/24   [provider]  ferrous sulfate  325 (65 FE) MG tablet Take 1 tablet (325 mg total) by mouth daily with breakfast. 09/15/19 06/11/24  Verdene Purchase, MD  gabapentin  (NEURONTIN ) 300 MG capsule Take 1 capsule (300 mg total) by mouth 3 (three) times daily. 05/29/24   Maczis, Michael M, PA-C  lisinopril  (PRINIVIL ,ZESTRIL ) 2.5 MG tablet Take 1 tablet (2.5 mg total) by mouth daily. Patient not taking: Reported on 06/11/2024 01/25/15   Ragena Odor, MD  lubiprostone  (AMITIZA ) 24 MCG capsule Take 1 capsule (24 mcg total) by mouth daily with breakfast. 05/30/24   Maczis, Michael M, PA-C  metFORMIN  (GLUCOPHAGE ) 500 MG tablet Take 1,000 mg by mouth 2 (two) times daily with a meal.    [provider]  methocarbamol  (ROBAXIN ) 500 MG tablet Take 2 tablets (1,000 mg total) by mouth every 8 (eight) hours as needed for muscle spasms. 05/29/24   Maczis, Michael M, PA-C  naloxone  (NARCAN ) 0.4 MG/ML injection Inject 1 mL (0.4 mg total) into the vein as needed. Patient not taking: Reported on 06/11/2024 05/29/24   Maczis, Michael M, PA-C  omeprazole (PRILOSEC) 20 MG capsule Take 20 mg by mouth in the morning and at bedtime.    [provider]  oxyCODONE  (OXY IR/ROXICODONE ) 5 MG immediate release tablet Take  0.5-1 tablets (2.5-5 mg total) by mouth every 6 (six) hours as needed for breakthrough pain (2.5 mg for moderate, 5 mg for severe). Patient taking differently: Take 5 mg by mouth every 6 (six) hours as needed for breakthrough pain (2.5 mg for moderate, 5 mg for severe). 05/29/24   Maczis, Michael M, PA-C  polyethylene glycol (MIRALAX  / GLYCOLAX ) packet Take 17 g by mouth 2 (two) times daily. 06/29/17   Armbruster, Elspeth SQUIBB, MD  simethicone  (MYLICON) 80 MG chewable tablet Chew 80 mg by mouth 2 (two) times daily.    [provider]  Vitamin D ,  Ergocalciferol , (DRISDOL ) 1.25 MG (50000 UNIT) CAPS capsule Take 1 capsule (50,000 Units total) by mouth once a week. Patient taking differently: Take 50,000 Units by mouth once a week. Saturdays 06/03/24   Maczis, Michael M, PA-C  zinc  oxide (BALMEX) 11.3 % CREA cream Apply 1 Application topically as needed.    [provider]    Allergies: Haloperidol lactate, Shrimp [shellfish allergy], and Strawberry (diagnostic)    Review of Systems  Unable to perform ROS: Mental status change (Intellectual disability)    Updated Vital Signs BP 138/77   Pulse 85   Temp 100 F (37.8 C) (Rectal)   Resp 10   SpO2 100%   Physical Exam Vitals and nursing note reviewed.  Constitutional:      Appearance: She is well-developed. She is obese.     Comments: Chronically ill-appearing but not ill-appearing  HENT:     Head: Normocephalic.     Comments: Well-healed forehead wound    Nose: Nose normal.     Mouth/Throat:     Mouth: Mucous membranes are moist.     Comments: Poor dentition Eyes:     Pupils: Pupils are equal, round, and reactive to light.  Cardiovascular:     Rate and Rhythm: Normal rate and regular rhythm.     Heart sounds: Normal heart sounds.  Pulmonary:     Effort: Pulmonary effort is normal. No respiratory distress.     Breath sounds: No wheezing.  Abdominal:     Palpations: Abdomen is soft.     Tenderness: There is no abdominal tenderness. There is no guarding or rebound.  Musculoskeletal:     Cervical back: Neck supple.     Comments: Left greater than right upper extremity with contracture, weakness and minimal grip strength, patient is in a brace for the thoracic and C-spine  Skin:    General: Skin is warm and dry.     Comments: Nonblanchable petechial rash bilateral lower extremities, spares the palms and soles PICC line left upper extremity without any erythema Deep sacral wound  Neurological:     Mental Status: She is alert and oriented to person, place, and  time.  Psychiatric:        Mood and Affect: Mood normal.     (all labs ordered are listed, but only abnormal results are displayed) Labs Reviewed  CBC WITH DIFFERENTIAL/PLATELET - Abnormal; Notable for the following components:      Result Value   WBC 14.3 (*)    RBC 3.77 (*)    Hemoglobin 9.6 (*)    HCT 30.3 (*)    MCH 25.5 (*)    RDW 19.0 (*)    nRBC 0.3 (*)    Lymphs Abs 5.7 (*)    Monocytes Absolute 2.1 (*)    Basophils Absolute 0.3 (*)    All other components within normal limits  COMPREHENSIVE METABOLIC PANEL WITH  GFR - Abnormal; Notable for the following components:   Glucose, Bld 129 (*)    Calcium  8.7 (*)    Total Protein 5.5 (*)    Albumin 2.0 (*)    All other components within normal limits  PROTIME-INR - Abnormal; Notable for the following components:   Prothrombin Time 17.7 (*)    INR 1.4 (*)    All other components within normal limits  URINALYSIS, ROUTINE W REFLEX MICROSCOPIC - Abnormal; Notable for the following components:   Ketones, ur 5 (*)    All other components within normal limits  I-STAT CG4 LACTIC ACID, ED - Abnormal; Notable for the following components:   Lactic Acid, Venous 5.1 (*)    All other components within normal limits  I-STAT CG4 LACTIC ACID, ED - Abnormal; Notable for the following components:   Lactic Acid, Venous 5.6 (*)    All other components within normal limits  CULTURE, BLOOD (ROUTINE X 2)  CULTURE, BLOOD (ROUTINE X 2)    EKG: None  Radiology: CT ABDOMEN PELVIS W CONTRAST Result Date: 08/12/2024 EXAM: CT ABDOMEN AND PELVIS WITH CONTRAST 08/12/2024 04:11:48 AM TECHNIQUE: CT of the abdomen and pelvis was performed with the administration of 75 mL of iohexol  (OMNIPAQUE ) 350 MG/ML injection. Multiplanar reformatted images are provided for review. Automated exposure control, iterative reconstruction, and/or weight-based adjustment of the mA/kV was utilized to reduce the radiation dose to as low as reasonably achievable.  COMPARISON: Findings are compared to 05/23/2024. CLINICAL HISTORY: Sepsis; sacral wound. FINDINGS: LOWER CHEST: Central venous catheter seen at the superior cavoatrial junction. Moderate multilevel coronary artery calcifications. LIVER: The liver is unremarkable. GALLBLADDER AND BILE DUCTS: Gallbladder is unremarkable. No biliary ductal dilatation. SPLEEN: No acute abnormality. PANCREAS: No acute abnormality. ADRENAL GLANDS: No acute abnormality. KIDNEYS, URETERS AND BLADDER: No stones in the kidneys or ureters. No hydronephrosis. No perinephric or periureteral stranding. Urinary bladder is unremarkable. GI AND BOWEL: Large stool burden without evidence of obstruction. The rectosigmoid colon, however, is relatively decompressed. Appendix normal. The stomach, small bowel, and large bowel are otherwise unremarkable. PERITONEUM AND RETROPERITONEUM: No ascites. No free air. VASCULATURE: Mild aortoiliac atherosclerotic calcification. No aortic aneurysm. LYMPH NODES: No lymphadenopathy. REPRODUCTIVE ORGANS: No acute abnormality. BONES AND SOFT TISSUES: Since the prior examination, a sacral decubitus wound has developed, which approaches the cortex of the subjacent sacrum (75/3) in keeping with a grade 3-4 sacral decubitus wound. No osseous erosion to indicate superimposed osteomyelitis. 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 No acute bone abnormality. IMPRESSION: 1. 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 2. Large stool burden without evidence of obstruction, with relatively decompressed rectosigmoid colon. Electronically signed by: Dorethia Molt MD 08/12/2024 04:43 AM EST RP Workstation: [id number]     .Critical Care  Performed by: Bari Charmaine FALCON, MD Authorized by: Bari Charmaine FALCON, MD   Critical care provider statement:    Critical care time (minutes):  60    Critical care was necessary to treat or prevent imminent or life-threatening deterioration of the following conditions:  Sepsis   Critical care was time spent personally by me on the following activities:  Development of treatment plan with patient or surrogate, discussions with consultants, evaluation of patient's response to treatment, examination of patient, ordering and review of laboratory studies, ordering and review of radiographic studies, ordering and performing treatments and interventions, pulse oximetry, re-evaluation of patient's condition and review of old  charts    Medications Ordered in the ED  lactated ringers  infusion ( Intravenous New Bag/Given 08/12/24 0518)  metroNIDAZOLE  (FLAGYL ) IVPB 500 mg (500 mg Intravenous New Bag/Given 08/12/24 0503)  lactated ringers  bolus 1,000 mL (0 mLs Intravenous Stopped 08/12/24 0453)    And  lactated ringers  bolus 1,000 mL (0 mLs Intravenous Stopped 08/12/24 0506)    And  lactated ringers  bolus 250 mL (0 mLs Intravenous Stopped 08/12/24 0517)  ceFEPIme  (MAXIPIME ) 2 g in sodium chloride  0.9 % 100 mL IVPB (0 g Intravenous Stopped 08/12/24 0343)  linezolid  (ZYVOX ) IVPB 600 mg (0 mg Intravenous Stopped 08/12/24 0453)  iohexol  (OMNIPAQUE ) 350 MG/ML injection 75 mL (75 mLs Intravenous Contrast Given 08/12/24 0413)    Clinical Course as of 08/12/24 0539  Sat Aug 12, 2024  0228 Spoke to caregiver at [facility] health care.  Patient was on vancomycin  for her sacral decubitus wound.  She was discontinued off of vancomycin  on Monday.  Was still on Levaquin and doxycycline.  Was evaluated and felt like she might be septic by the NP and that was the reason for transfer. [CH]  0246 Spoke with pharmacy regarding coverage.  Agree with cefepime  and Flagyl .  Would also add linezolid . [CH]    Clinical Course User Index [CH] Senya Hinzman, Charmaine FALCON, MD                                 Medical Decision Making Amount and/or Complexity of Data Reviewed Labs:  ordered. Radiology: ordered.  Risk Prescription drug management. Decision regarding hospitalization.   This patient presents to the ED for concern of rash and altered mental status, this involves an extensive number of treatment options, and is a complaint that carries with it a high risk of complications and morbidity.  I considered the following differential and admission for this acute, potentially life threatening condition.  The differential diagnosis includes vasculitis, infection, sepsis  MDM:    This is a 43 year old female with significant intellectual delay and recent history complicated with central cord syndrome who presents with concern for rash.  Is on vancomycin  reportedly for the sacral decubitus ulcer.  This was discontinued on Monday is currently on Levaquin and doxycycline.  Had decreased mental status and worsening of bilateral lower extremity rash.  She is nontoxic.  Initially afebrile and vital signs are reassuring.  She has a diffuse petechial rash on the bilateral lower extremities.  This is a known rare complication of vancomycin  but also could be due to other vasculitis.  Patient is afebrile initially and normotensive.  Lab work returned with white count of 14.3 and a lactate of 5.1.  For this reason, sepsis workup was initiated.  Broad-spectrum antibiotics initiated.  Consulted with pharmacy regarding choice of antibiotic.  Patient has a PICC line in the left upper extremity which does not appear hot or red and also has a sacral decubitus ulcer.  These would be the 2 most obvious culprits for possible sepsis.  Urinalysis does not show any evidence of UTI.  CT scan does not show any obvious osteomyelitis.  She does have a significant grade 4 sacral decubitus wound.  She has had persistent lactic acidosis despite appropriate 30 cc/kg fluid resuscitation.  She was continued on 150 cc/h and will be admitted to the hospitalist.  She remains hemodynamically stable.  (Labs, imaging,  consults)  Labs: I Ordered, and personally interpreted labs.  The pertinent results include: Sepsis  lab work  Imaging Studies ordered: I ordered imaging studies including CT imaging I independently visualized and interpreted imaging. I agree with the radiologist interpretation  Additional history obtained from chart review.  External records from outside source obtained and reviewed including prior hospitalizations  Cardiac Monitoring: The patient was maintained on a cardiac monitor.  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   Excessive anger    GERD (gastroesophageal reflux disease)    Hallucinations    Hyperlipidemia    Hypertension    Mild mental retardation    Schizophrenia (HCC)    Seizures (HCC)    Vitamin D  deficiency      Medicines Meds ordered this encounter  Medications   lactated ringers  infusion   AND Linked Order Group    lactated ringers  bolus 1,000 mL     Enter Patient Weight in Kilograms:   70    lactated ringers  bolus 1,000 mL     Enter Patient Weight in Kilograms:   70    lactated ringers  bolus 250 mL     Enter Patient Weight in Kilograms:   70   ceFEPIme  (MAXIPIME ) 2 g in sodium chloride  0.9 % 100 mL IVPB    Antibiotic Indication::   Other Indication (list below)    Other Indication::   Unknown Source.   metroNIDAZOLE  (FLAGYL ) IVPB 500 mg    Antibiotic Indication::   Other Indication (list below)    Other Indication::   Unknown Source.   linezolid  (ZYVOX ) IVPB 600 mg    Antibiotic  Indication::   Other Indication (list below)    Other Indication::   sepsis   iohexol  (OMNIPAQUE ) 350 MG/ML injection 75 mL    I have reviewed the patients home medicines and have made adjustments as needed  Problem List / ED Course: Problem List Items Addressed This Visit   None Visit Diagnoses       Sepsis, due to unspecified organism, unspecified whether acute organ dysfunction present (HCC)    -  Primary     Petechial rash                    Final diagnoses:  Sepsis, due to unspecified organism, unspecified whether acute organ dysfunction present [facility])  Petechial rash    ED Discharge Orders     None          Bari Charmaine FALCON, MD 08/12/24 [phone]

## 2024-08-12 NOTE — ED Notes (Signed)
 Attempted notification of patient's case worker/legal guardian and left voicemail.

## 2024-08-12 NOTE — Progress Notes (Signed)
 positive for DVT in the bilateral lower extremities. No DVT noted in the upper extremities, however limited due to contorted state.

## 2024-08-12 NOTE — ED Notes (Addendum)
 Patient taken to vascular. Repeat lactic delayed due to pt off floor.

## 2024-08-12 NOTE — Progress Notes (Signed)
 [facility]   ROUNDING   NOTE Alexandria Harvey FMW:[id number]  DOB: 1981/02/10  DOA: 08/11/2024  PCP: [facility]  08/12/2024,7:02 AM  LOS: 0 days    Code Status:  full     From: [facility]   43 year old female- Under guardianship of care for DSS-phone #[phone]--- apparently also has a caregiver [name]/[name] who is the group home later DM TY 2  mental retardationSchizoaffective disorder -previous hallucination's Obesity BMI 28 Previous sacral decubitus ulcer   9/16-9/22 2025 fall T4-T5 fracture, C3 C5 C7 posterior element fractures with central cord syndrome-no surgery as per neurosurgery-had a for head laceration at the time which was closed by ENT Dr. Luciano and also a prior left foot fracture that was supposed to follow-up with [facility]   10/5-10/04/2024 toxic metabolic encephalopathy secondary to febrile seizures versus other seizure fevers 103 growing Staph capitis and hominis?  Contamination--- she was kept on broad-spectrum antibiotics and could not rule out meningitis as has a sacral wound CT head showed chronic small vessel ischemia and neurology was involved in care she was loaded with Depakote  and was transition to IV vancomycin  which she completed during the hospital stay  Per [facility] care center notes she has been on various medications for what sounds like a sacral decubitus ulcer and had been on IV antibiotics Levaquin 750 daily Zosyn  3.37 Q8 with an expected stop date 12/21 for Levaquin and 12/16 for Zosyn   Nurse practitioner at the facility notified 11/28 that she had worsening somnolence and altered mental status which was a large change from her premorbid baseline of being interactive-she was difficult to arouse would open eyes to sternal rub according to therapy who informed the NP 12/1 a rash was noted by practitioner CBC be met sed rate CRP iron B12 folate were drawn-she was started on oral prednisone 20 mg and Benadryl  12.5  every 6 for 2 days-at that time mental status seemed a little bit improved and she was referred to infectious disease for scheduling to be seen and was placed on IV saline continuously X40 8 hours KUB was obtained  12/5 came over to [facility] because of worsening mental state and rash as well as low WBC count Workup here revealed WBC 14.3 hemoglobin 9.6 platelet 278 INR 1.4 Sodium 137 potassium 3.6 BUN/creatinine 10/0.4 LFTs normal Lactic acid 5.1 with repeat 5.6 blood cultures obtained UA negative CT abdomen pelvis grade 3-4 sacral decubitus approaching the cortex of the subjacent sacrum without osseous erosion to indicate superimposed osteomyelitis large stool burden without evidence of obstruction with relatively decompressed rectosigmoid colon CXR left PICC line in appropriate position hypoinflated lung with asymmetric elevation right hemidiaphragm  She was started by the emergency room on lactated ringer  received close to 3 L of fluid started on cefepime  and Zyvox  Flagyl  and because of the petechial rash as well as the leukocytosis was admitted   Assessment  & Plan :    Sepsis?  Cause most likely PICC line  I will ask IV team to remove this access give a line holiday and consult ID-I do not think that her sacral decubitus is grossly infected especially after review of pictures as below and lack of purulence as per my exam with nursing staff this morning    For now I would continue IV linezolid  600 every 12 Flagyl  500 every 12 as well as cefepime  2 g Q8 We will involve infectious disease to delineate medications as well as de-escalate antibiotics based  on blood cultures-I I think her lactic acidosis is multifactorial, she is on metformin  which can cause a type B lactic acidosis and is mentating a little bit better this morning after waking her up and her pressures are stable She may be able to be downgraded to a telemetry bed  I will get an echocardiogram  Rash Was treated with  prednisone 20 since 7/28 as well as Benadryl  and it apparently was improving She is on Clozaril  which can cause erythema multiforme although this rash does not appear to be significant for that We will have to wean that unfortunately slowly and I will enlist the help of psychiatry to help determine the next steps for her meds as she has significant schizophrenia with SI in the past      Toxic metabolic encephalopathy on admission This is probably secondary to polypharmacy +/- PICC line infection as above She had febrile seizures during her hospitalization previously I will keep her on Depakote  250 3 times daily for now I will cut back significantly her gabapentin  to 100 3 times daily, continue Cogentin  1 twice daily BuSpar  5 3 times daily can continue I have cut back her oxycodone  to 2.5 every 4 as needed she can use Tylenol  as needed See above regarding Clozaril  Psychiatry consulted  Diabetes mellitus type 2 She is awake enough to allow diabetic diet-stop metformin  completely  Unclear indication for Eliquis --- there is someone did a vascular lower extremity duplex and placed the patient on apixaban  5 twice daily?  Pete Dopplers of both upper and lower extremities especially with PICC line in the left side  Recent traumatic injury to neck with polytrauma Needs to maintain c-collar Follow-up with Dr. Raymon meds as above because of mild encephalopathy Will need to add back meds when able  Severe constipation/large stool burden continue MiraLAX  and senna HTN HLD Atenolol  50 atorvastatin  80 Zetia  10 lisinopril  2.5 all held and given copious IV fluids   DVT prophylaxis: SCD at this time  Status is: Inpatient Inpatient    Dispo/Global plan: Inpatient   Time 75 including care coordination time   Subjective:   She is interactive able to tell me date time year-she was quite agitated earlier today and sleepy She allowed us  to look at her back and her bottom See the  pictures above She remains in a c-collar      Objective + exam Vitals:   08/12/24 0300 08/12/24 0430 08/12/24 0515 08/12/24 0600  BP:  124/71 138/77 107/70  Pulse:  82 85 77  Resp:  13 10 12   Temp: 100 F (37.8 C)     TempSrc: Rectal     SpO2:  100% 100% 98%  Weight:    70.2 kg  Height:    5' 2 (1.575 m)   Filed Weights   08/12/24 0600  Weight: 70.2 kg     Examination: Hirsute awake white female no distress PICC line in place left arm as above Chest clear S1-S2?  Slight murmur LLSB Abdomen obese distended Pictures etc. as above of rash   Scheduled Meds:  apixaban   5 mg Oral BID   benztropine   1 mg Oral BID   busPIRone   5 mg Oral TID   cloZAPine   25 mg Oral BID   collagenase    Topical Daily   divalproex   250 mg Oral TID   gabapentin   100 mg Oral TID   polyethylene glycol  17 g Oral BID   senna-docusate  1 tablet Oral Daily  Continuous Infusions:  ceFEPime  (MAXIPIME ) IV     lactated ringers  150 mL/hr at 08/12/24 0518   linezolid  (ZYVOX ) IV     metronidazole      acetaminophen  **OR** acetaminophen , ondansetron  **OR** ondansetron  (ZOFRAN ) IV, oxyCODONE   Colen Grimes, MD  [facility]

## 2024-08-12 NOTE — ED Notes (Signed)
 Introduced self as engineer, civil (consulting) to patient at this time.   Samtani, MD at bedside with me to assess sacral wound. General plan of care discussed with MD.   Patient is resting in bed with visible chest rise and fall. The call light is in reach. Bed is locked and in lowest position.

## 2024-08-12 NOTE — Plan of Care (Signed)
  Problem: Education: Goal: Knowledge of General Education information will improve Description: Including pain rating scale, medication(s)/side effects and non-pharmacologic comfort measures Outcome: Progressing   Problem: Health Behavior/Discharge Planning: Goal: Ability to manage health-related needs will improve Outcome: Progressing   Problem: Clinical Measurements: Goal: Ability to maintain clinical measurements within normal limits will improve Outcome: Progressing Goal: Will remain free from infection Outcome: Progressing Goal: Diagnostic test results will improve Outcome: Progressing Goal: Respiratory complications will improve Outcome: Progressing Goal: Cardiovascular complication will be avoided Outcome: Progressing   Problem: Activity: Goal: Risk for activity intolerance will decrease Outcome: Progressing   Problem: Elimination: Goal: Will not experience complications related to bowel motility Outcome: Progressing Goal: Will not experience complications related to urinary retention Outcome: Progressing   Problem: Pain Managment: Goal: General experience of comfort will improve and/or be controlled Outcome: Progressing

## 2024-08-12 NOTE — Progress Notes (Signed)
 Lab unable to get blood work from patient despite multiple attempts. MD notified. Plan is to try again tom.

## 2024-08-12 NOTE — Progress Notes (Signed)
 Echocardiogram 2D Echocardiogram has been performed with echo imagining agent (definity ).  [name] N [name],RDCS 08/12/2024, 9:45 AM

## 2024-08-12 NOTE — Consult Note (Incomplete)
 [facility] Psychiatric Consult Initial  Patient Name: .Alexandria Harvey  MRN: [id number]  DOB: [birth year]  Consult Order details:  Orders (From admission, onward)     Start     Ordered   08/12/24 0813  IP CONSULT TO PSYCHIATRY       Comments: Needs alternates to clozaril  and help with medication management thank you  Ordering Provider: Samtani, Jai-Gurmukh, MD  Provider:  (Not yet assigned)  Question Answer Comment  Location [facility]   Reason for Consult? Consult request      08/12/24 0813             Mode of Visit: In person    Psychiatry Consult Evaluation  Service Date: August 12, 2024 LOS:  LOS: 0 days  Chief Complaint I guess I'm ok, I don't know  Primary Psychiatric Diagnoses  Schizoaffective disorder, depressive type IDD   Assessment  Alexandria Harvey is a 43 y.o. female admitted: Medicallyfor 08/11/2024 11:43 PM per Alexandria Harvey with medical history significant of with history of diabetes, GERD, schizoaffective disorder, hypertension, hyperlipidemia, disability who to the emergency department due to a rash patient was recently discharged from the hospital on 10/8. She had a boot on an injured leg and tripped on 9/16 and broke her neck with a neck brace in place. She was admitted for AMS and a rash, Vancomycin  was in place during rehab for sepsis.  Her current presentation of seeing and hearing things with past documentation meeting the diagnosis of schizoaffective d/o.  Current outpatient psychotropic medications include Clozaril , Cogentin , Depakote , Buspar , Ativan , gabapentin  and historically she has had a positive response to these medications. She was compliant with medications prior to admission as evidenced by facility notes. On initial examination, patient was able to communicate a few things verbally prior to drifting off to sleep. Please see plan below for detailed recommendations.   Diagnoses:  Active Hospital problems: Principal  Problem:   Sepsis (HCC)    Plan   ## Psychiatric Medication Recommendations:  Clozaril  100 mg BID decreased to 50 mg BID, Clozaril  level ordered on 12/6 Depakote  250 mg TID, valproic  acid level ordered-12/6--41 level result Gabapentin  300 mg TID decreased to 100 mg TID per MD Cogentin  1 mg BID on hold Buspar  5 mg TID on hold  ## Medical Decision Making Capacity: Patient has a guardian and has thus been adjudicated incompetent; please involve patients guardian in medical decision making  ## Further Work-up:  -- most recent EKG on 06/11/24 had QtC of 457 -- Pertinent labwork reviewed earlier this admission includes: CBC, chem panel, EKG, U/A   ## Disposition:-- There are no psychiatric contraindications to discharge at this time  ## Behavioral / Environmental: -Delirium Precautions: Delirium Interventions for Nursing and Staff: - RN to open blinds every AM. - To Bedside: Glasses, hearing aide, and pt's own shoes. Make available to patients. when possible and encourage use. - Encourage po fluids when appropriate, keep fluids within reach. - OOB to chair with meals. - Passive ROM exercises to all extremities with AM & PM care. - RN to assess orientation to person, time and place QAM and PRN. - Recommend extended visitation hours with familiar family/friends as feasible. - Staff to minimize disturbances at night. Turn off television when pt asleep or when not in use.    ## Safety and Observation Level:  - Based on my clinical evaluation, I estimate the patient to be at minimal risk of self harm in the current setting. -  At this time, we recommend  routine. This decision is based on my review of the chart including patient's history and current presentation, interview of the patient, mental status examination, and consideration of suicide risk including evaluating suicidal ideation, plan, intent, suicidal or self-harm behaviors, risk factors, and protective factors. This judgment is based on our  ability to directly address suicide risk, implement suicide prevention strategies, and develop a safety plan while the patient is in the clinical setting. Please contact our team if there is a concern that risk level has changed.  CSSR Risk Category:C-SSRS RISK CATEGORY: No Risk  Suicide Risk Assessment: Patient has following modifiable risk factors for suicide: none Patient has following non-modifiable or demographic risk factors for suicide: psychiatric hospitalization Patient has the following protective factors against suicide: Access to outpatient mental health care  Thank you for this consult request. Recommendations have been communicated to the primary team.  We will continue to follow at this time.   Alexandria Becker, NP       History of Present Illness  Relevant Aspects of [facility] Course:  Admitted on 08/11/2024 for AMS and a rash. They broke their neck on 05/23/2024 due to tripping because of the boot on her injured leg and moved to rehab after hospitalization versus her group home.  Patient does have documented IDD and schizoaffective d/o. She was admitted for AMS and a rash, Vancomycin  was in place during rehab for sepsis.  Patient Report:  43 yo female was lying calmly in her hospital bed with neck/chest brace in place for her cervical fracture, history of schizoaffective d/o. When asked how she was doing, she stated, I guess ok, I don't know.  Then she answered I guess to the next question and drifted back to sleep.  This provider and Alexandria Harvey adjusted her medications yesterday to assist with AMS.  Unfortunately, the lab was unable to obtain a blood level today.  Her valproic  acid level was 41 yesterday.  Psych ROS:  Depression: UTA Anxiety:  UTA Mania (lifetime and current): UTA Psychosis: (lifetime and current): past  Review of Systems  Psychiatric/Behavioral:  Positive for hallucinations.      Psychiatric and Social History  Psychiatric History:  Information  collected from patient and chart.  Prev Dx/Sx: schizoaffective d/o Current Psych Provider: Dr [name] Harvey Home Meds (current): Clozaril  Previous Med Trials: Zyprexa  Prior Psych Hospitalization: [facility] in 2016 Prior Self Harm: past history Prior Violence: none  Social History:  Developmental Hx: IDD per chart Living Situation: [facility] rehab, prior to rehab she was in a group home  Access to weapons/lethal means: none   Substance History None noted in chart  Exam Findings  Physical Exam: completed by MD, reviewed. Vital Signs:  Temp:  [97.9 F (36.6 C)-100 F (37.8 C)] 97.9 F (36.6 C) (12/06 0930) Pulse Rate:  [75-97] 75 (12/06 0930) Resp:  [10-16] 11 (12/06 0930) BP: (107-138)/(70-86) 127/78 (12/06 0930) SpO2:  [95 %-100 %] 100 % (12/06 0930) Weight:  [70.2 kg] 70.2 kg (12/06 0600) Blood pressure 127/78, pulse 75, temperature 97.9 F (36.6 C), temperature source Oral, resp. rate 11, height 5' 2 (1.575 m), weight 70.2 kg, SpO2 100%. Body mass index is 28.31 kg/m.  Physical Exam  Mental Status Exam: General Appearance: Casual  Orientation:  Other:  person and place  Memory:  Immediate;   Fair  Concentration:  Concentration: Poor and Attention Span: Poor  Recall:  Fair to poor  Attention  Poor  Eye Contact:  Minimal  Speech:  Clear and Coherent  Language:  Fair  Volume:  Decreased  Mood: I guess ok, I don't know  Affect:  Blunt  Thought Process:  UTA  Thought Content:  Hallucinations: Auditory Visual  Suicidal Thoughts:  UTA  Homicidal Thoughts:  UTA  Judgement:  Impaired  Insight:  fair  Psychomotor Activity:  Decreased  Akathisia:  No  Fund of Knowledge:  IDD per chart      Assets:  Housing  Cognition:  Impaired,  Moderate  ADL's:  Impaired  AIMS (if indicated):        Other History   These have been pulled in through the EMR, reviewed, and updated if appropriate.  Family History:  The patient's family history includes Cancer in  her father; Diabetes Mellitus II in her mother.  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   Excessive anger    GERD (gastroesophageal reflux disease)    Hallucinations    Hyperlipidemia    Hypertension    Mild mental retardation    Schizophrenia (HCC)    Seizures (HCC)    Vitamin D  deficiency     Surgical History: Past Surgical History:  Procedure Laterality Date   NO PAST SURGERIES       Medications:   Current Facility-Administered Medications:    acetaminophen  (TYLENOL ) tablet 650 mg, 650 mg, Oral, Q6H PRN **OR** acetaminophen  (TYLENOL ) suppository 650 mg, 650 mg, Rectal, Q6H PRN, Harvey Charleston, MD   apixaban  (ELIQUIS ) tablet 5 mg, 5 mg, Oral, BID, Dorrell, Robert, MD, 5 mg at 08/12/24 9045   benztropine  (COGENTIN ) tablet 1 mg, 1 mg, Oral, BID, Dorrell, Robert, MD   busPIRone  (BUSPAR ) tablet 5 mg, 5 mg, Oral, TID, Dorrell, Robert, MD, 5 mg at 08/12/24 9046   cloZAPine  (CLOZARIL ) tablet 25 mg, 25 mg, Oral, BID, Alexandria Harvey, Jai-Gurmukh, MD   collagenase  (SANTYL ) ointment, , Topical, Daily, Alexandria Harvey, Jai-Gurmukh, MD   divalproex  (DEPAKOTE ) DR tablet 250 mg, 250 mg, Oral, TID, Alexandria Harvey, Jai-Gurmukh, MD, 250 mg at 08/12/24 9046   gabapentin  (NEURONTIN ) capsule 100 mg, 100 mg, Oral, TID, Alexandria Harvey, Jai-Gurmukh, MD   lactated ringers  infusion, , Intravenous, Continuous, Dorrell, Robert, MD, Last Rate: 150 mL/hr at 08/12/24 0518, New Bag at 08/12/24 0518   ondansetron  (ZOFRAN ) tablet 4 mg, 4 mg, Oral, Q6H PRN **OR** ondansetron  (ZOFRAN ) injection 4 mg, 4 mg, Intravenous, Q6H PRN, Dorrell, Robert, MD   oxyCODONE  (Oxy IR/ROXICODONE ) immediate release tablet 2.5 mg, 2.5 mg, Oral, Q4H PRN, Alexandria Harvey, Jai-Gurmukh, MD   perflutren  lipid microspheres (DEFINITY ) IV suspension, 1-10 mL, Intravenous, PRN, Alexandria Harvey, Jai-Gurmukh, MD, 2 mL at 08/12/24 0945   polyethylene glycol (MIRALAX  / GLYCOLAX ) packet 17  g, 17 g, Oral, BID, Alexandria Harvey, Jai-Gurmukh, MD, 17 g at 08/12/24 0954   senna-docusate (Senokot-S) tablet 1 tablet, 1 tablet, Oral, Daily, Alexandria Harvey, Jai-Gurmukh, MD, 1 tablet at 08/12/24 9045  Current Outpatient Medications:    acetaminophen  (TYLENOL ) 500 MG tablet, Take 2 tablets (1,000 mg total) by mouth every 8 (eight) hours as needed., Disp: , Rfl:    Amino Acids-Protein Hydrolys (FEEDING SUPPLEMENT, PRO-STAT SUGAR FREE 64,) LIQD, Take 30 mLs by mouth in the morning and at bedtime., Disp: , Rfl:    apixaban  (ELIQUIS ) 5 MG TABS tablet, Take 5 mg by mouth 2 (two) times daily. For 3 months, Disp: , Rfl:    [Paused] atenolol  (TENORMIN ) 50 MG tablet, Take 1 tablet (50 mg total)  by mouth daily. (Patient taking differently: Take 75 mg by mouth daily.), Disp: 2 tablet, Rfl: 0   atorvastatin  (LIPITOR ) 80 MG tablet, Take 1 tablet (80 mg total) by mouth at bedtime., Disp: 2 tablet, Rfl: 0   benztropine  (COGENTIN ) 1 MG tablet, Take 1 mg by mouth 2 (two) times daily., Disp: , Rfl:    busPIRone  (BUSPAR ) 5 MG tablet, Take 5 mg by mouth 3 (three) times daily., Disp: , Rfl:    calcium -vitamin D  (OSCAL WITH D) 500-5 MG-MCG tablet, Take 1 tablet by mouth 2 (two) times daily., Disp: , Rfl:    cloZAPine  (CLOZARIL ) 100 MG tablet, Take 100 mg by mouth 2 (two) times daily. , Disp: , Rfl:    divalproex  (DEPAKOTE ) 250 MG DR tablet, Take 3 tablets (750 mg total) by mouth 3 (three) times daily., Disp: , Rfl:    ezetimibe  (ZETIA ) 10 MG tablet, Take 10 mg by mouth daily., Disp: , Rfl:    gabapentin  (NEURONTIN ) 300 MG capsule, Take 1 capsule (300 mg total) by mouth 3 (three) times daily., Disp: , Rfl:    lubiprostone  (AMITIZA ) 24 MCG capsule, Take 1 capsule (24 mcg total) by mouth daily with breakfast., Disp: , Rfl:    methocarbamol  (ROBAXIN ) 500 MG tablet, Take 2 tablets (1,000 mg total) by mouth every 8 (eight) hours as needed for muscle spasms., Disp: , Rfl:    omeprazole (PRILOSEC) 20 MG capsule, Take 20 mg by mouth in the  morning and at bedtime., Disp: , Rfl:    polyethylene glycol (MIRALAX  / GLYCOLAX ) packet, Take 17 g by mouth 2 (two) times daily., Disp: 60 packet, Rfl: 1   predniSONE (DELTASONE) 20 MG tablet, Take 20 mg by mouth daily with breakfast. For 5 days, Disp: , Rfl:    sennosides-docusate sodium  (SENOKOT-S) 8.6-50 MG tablet, Take 1 tablet by mouth daily., Disp: , Rfl:    simethicone  (MYLICON) 80 MG chewable tablet, Chew 80 mg by mouth 2 (two) times daily., Disp: , Rfl:    Vitamin D , Ergocalciferol , (DRISDOL ) 1.25 MG (50000 UNIT) CAPS capsule, Take 1 capsule (50,000 Units total) by mouth once a week. (Patient taking differently: Take 50,000 Units by mouth once a week. Thursdays), Disp: , Rfl:    zinc  oxide (BALMEX) 11.3 % CREA cream, Apply 1 Application topically as needed., Disp: , Rfl:    collagenase  (SANTYL ) 250 UNIT/GM ointment, Apply topically daily. (Patient not taking: Reported on 08/12/2024), Disp: , Rfl:    [Paused] ferrous sulfate  325 (65 FE) MG tablet, Take 1 tablet (325 mg total) by mouth daily with breakfast. (Patient not taking: Reported on 08/12/2024), Disp: 30 tablet, Rfl: 0   [Paused] lisinopril  (PRINIVIL ,ZESTRIL ) 2.5 MG tablet, Take 1 tablet (2.5 mg total) by mouth daily. (Patient not taking: Reported on 06/11/2024), Disp: 2 tablet, Rfl: 0   naloxone  (NARCAN ) 0.4 MG/ML injection, Inject 1 mL (0.4 mg total) into the vein as needed. (Patient not taking: Reported on 06/11/2024), Disp: , Rfl:    oxyCODONE  (OXY IR/ROXICODONE ) 5 MG immediate release tablet, Take 0.5-1 tablets (2.5-5 mg total) by mouth every 6 (six) hours as needed for breakthrough pain (2.5 mg for moderate, 5 mg for severe). (Patient not taking: Reported on 08/12/2024), Disp: 10 tablet, Rfl: 0   piperacillin -tazobactam (ZOSYN ) 3.375 GM/50ML IVPB, Inject 3.375 g into the vein every 8 (eight) hours. For 6 weeks (Patient not taking: Reported on 08/12/2024), Disp: , Rfl:   Allergies: Allergies  Allergen Reactions   Haloperidol Lactate  Other (See Comments)  Unknown reaction   Shrimp [Shellfish Allergy] Hives and Rash    All Shellfish per [facility]   Strawberry (Diagnostic) Rash    Alexandria Becker, NP

## 2024-08-12 NOTE — Sepsis Progress Note (Signed)
 Elink monitoring for the code sepsis protocol.

## 2024-08-12 NOTE — ED Notes (Signed)
 Attempted to add patient on with CCMD. Monitor tech stated patient CSN can not be found after multiple attempts.

## 2024-08-12 NOTE — Progress Notes (Signed)
 A consult was placed to IV Therapy to remove LUA DL PICC line;  unsure when or where PICC was placed;  picc length 36cm; no bleeding; site wnl; pressure drsg intact;  pt having echo done at the bedside at this time.  Has a PIV in the right arm.

## 2024-08-12 NOTE — Consult Note (Signed)
 Consult placed for medication management, currently in the ED.  This provider went to see her and she was in a scan.  The tech reported she was non-verbal this morning besides saying ow at times.  She resides at [facility] and presented with AMS.  Per the chart she has a history of schizoaffective d/o and typically takes:    Clozaril  100 mg BID decreased to 50 mg BID, Clozaril  level ordered on 12/6 Depakote  250 mg TID, valproic  acid level ordered-12/6 Gabapentin  300 mg TID decreased to 100 mg TID per MD Cogentin  1 mg BID on hold Buspar  5 mg TID on hold  Sharlot Becker, PMHNP

## 2024-08-12 NOTE — Plan of Care (Signed)

## 2024-08-12 NOTE — Consult Note (Addendum)
 Date of Admission:  08/11/2024          Reason for Consult: Petechial rash    Referring Provider: Reggie Hutchinson, MD   Assessment:  Petechial rash--suspect adverse drug reaction to Zosyn  Rounded lesion on the ventricular aspect of her coronary cusp of the aortic valve History of T4-T5 fracture C3 C5 C7 fracture with central cord syndrome Stage III /stage IV decubitus ulcer for which she had been placed on antibiotics at the facility where she resides History of toxic metabolic encephalopathy Lactic acidosis presumed secondary to metformin  Cognitive delay Schizoaffective disorder   Plan:  Stop all antibiotics Follow-up blood cultures Would repeat blood cultures in a few days when the current antibiotics have washed out of her system Will send off labs for hypercoagulable state that can cause a Libman-Sacks type endocarditis, such as antiphospholipid antibody, auto immune labs Screen for viral hepatides Standard universal precautions   Principal Problem:   Petechial rash Active Problems:   Sepsis (HCC)   Aortic valve mass   Scheduled Meds:  apixaban   5 mg Oral BID   cloZAPine   50 mg Oral BID   collagenase    Topical Daily   divalproex   250 mg Oral TID   gabapentin   100 mg Oral TID   polyethylene glycol  17 g Oral BID   senna-docusate  1 tablet Oral Daily   Continuous Infusions:  lactated ringers  150 mL/hr at 08/12/24 0518   PRN Meds:.acetaminophen  **OR** acetaminophen , ondansetron  **OR** ondansetron  (ZOFRAN ) IV, oxyCODONE   HPI: Alexandria Harvey is a 43 y.o. female who has mental retardation and schizoaffective disorder and is under guardianship fell this September and sustained a T4-5 fracture and C3 C5 C7 posterior fractures with central cord syndrome.  No neurosurgical intervention was performed.  She did have a head laceration that was addressed by Dr. Alycia from ENT and a left foot fracture that was going to be followed by [facility]  She had been residing at skilled facility and been treated with various antibiotics for a decubitus ulcer though the ulcer currently is not overtly purulent or infected and it does not track to bone.  Apparently she had been on IV antibiotics including including levofloxacin 700 mg daily and Zosyn  3.37 g every 8 hours and was still on his antibiotics when she was admitted to the hospital apparently on 28 November she had worsening somnolence from her baseline mentation.  On the first she was found to have a rash and she was started on oral prednisone and Benadryl .  She then ultimately was sent over to [facility] due to worsening mental status as well as worsening of the rash and worsening leukocytosis.  Labs in the emergency department were pertinent for white count of 14,000 normal platelets normal INR normal creatinine normal liver function test and a lactic acid of 5.1 she had blood cultures taken UA was negative she had a CT abdomen pelvis done which showed a a stage III nearly 4 decubitus ulcer approaching the cortex of the sacrum though without radiographic erosion to indicate osteomyelitis.  She has been found to have lactic acidosis with lactic acid of 5 though this is felt more likely due to metformin  use.  She was given fluids in the emergency department and then started on cefepime  Zyvox  and Flagyl .  She has subsequently had a stat echocardiogram performed by cardiology which have shown a rounded echodensity on the ventricular side of the right  coronary cusp of her aortic valve that is 6 mm x 8 mm in dimensions.  We are consulted to help workup the cause of her petechial rash and as mentioned she has now been found to have a lesion on the aortic valve.  I think at this point in time the best thing is for us  to stop all antibiotic therapy.  First of all we do not know whether or not this petechial rash is due to one of the antibiotics she was on which seems  quite likely in which case it would be more likely due to the beta-lactam the Zosyn .  The finding on echocardiogram is not by description classic for bacterial endocarditis.  She does have blood cultures that were taken though they will probably be not very helpful since she was been on antibiotics her PICC line has been removed as well.  I would recommend observing her in the hospital off antibiotics and obtaining some additional blood cultures once the antibiotics she has been on have washed out of her system will also send off some labs for hyper hypercoagulable state because she certainly could have pathology in her valve related to that i.e. a Libman-Sacks or marantic endocarditis certainly it does not appear by description to be consistent with bacterial endocarditis.  I am not completely opposed to considering bacterial endocarditis and treating her for this.  That being said she is not going to go have a transesophageal echocardiogram to further elucidate the pathology and I do not see any urgency to treating her for this when there is not any definitive evidence that she has endocarditis.  Other labs of note that she has had include testing for COVID flu and RSV which have all been negative as well as a 20 pathogen respiratory panel which is also negative.  She had a negative HIV test in September 2025 but I do not see that she has been screened for viral hepatitides which we will do today.    I personally spent a total of 82 minutes in the care of the patient today including preparing to see the patient, getting/reviewing separately obtained history, performing a medically appropriate exam/evaluation, counseling and educating, placing orders, referring and communicating with other health care professionals, documenting clinical information in the EHR, independently interpreting results, communicating results, and coordinating care.    Evaluation of the patient requires complex  antimicrobial therapy evaluation, counseling , isolation needs to reduce disease transmission and risk assessment and mitigation.     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   Excessive anger    GERD (gastroesophageal reflux disease)    Hallucinations    Hyperlipidemia    Hypertension    Mild mental retardation    Schizophrenia (HCC)    Seizures (HCC)    Vitamin D  deficiency     Social History   Tobacco Use   Smoking status: Never   Smokeless tobacco: Never  Substance Use Topics   Alcohol use: No   Drug use: No    Family History  Problem Relation Age of Onset   Diabetes Mellitus II Mother    Cancer Father    Allergies  Allergen Reactions   Haloperidol Lactate Other (See Comments)    Unknown reaction   Shrimp [Shellfish Allergy] Hives and Rash    All Shellfish per [facility]  Strawberry (Diagnostic) Rash    OBJECTIVE: Blood pressure 127/78, pulse 75, temperature 97.9 F (36.6 C), temperature source Oral, resp. rate 11, height 5' 2 (1.575 m), weight 70.2 kg, SpO2 100%.  Physical Exam HENT:     Head: Normocephalic and atraumatic.     Comments: Laceration repair on scalp  Wearing collar Eyes:     Extraocular Movements: Extraocular movements intact.  Cardiovascular:     Rate and Rhythm: Normal rate and regular rhythm.     Heart sounds: No murmur heard.    No friction rub. No gallop.  Pulmonary:     Effort: Pulmonary effort is normal. No respiratory distress.     Breath sounds: No stridor. No wheezing or rhonchi.  Abdominal:     General: There is no distension.  Neurological:     Mental Status: She is oriented to person, place, and time.     Comments: She has contractures of her upper extremities and is bedbound  Psychiatric:        Attention and Perception: She is inattentive.        Mood and  Affect: Mood is anxious. Affect is labile.        Speech: Speech is delayed.        Behavior: Behavior is cooperative.        Cognition and Memory: Cognition is impaired.     Comments: She seems to have hyperesthesia with screaming when I simply removed her socks to examine them   Pictures of decubitus ulcer reviewed         PICC prior to removal    Rash        Feet      She does not have any splinter hemorrhages on her nails nor does she have any lesions on her palms or soles other than the thickening of her skin seen in the pictures above  Lab Results  Lab Results  Component Value Date   WBC 14.3 (H) 08/12/2024   HGB 9.6 (L) 08/12/2024   HCT 30.3 (L) 08/12/2024   MCV 80.4 08/12/2024   PLT 278 08/12/2024    Lab Results  Component Value Date   CREATININE 0.46 08/12/2024   BUN 10 08/12/2024   NA 137 08/12/2024   K 3.6 08/12/2024   CL 100 08/12/2024   CO2 22 08/12/2024    Lab Results  Component Value Date   ALT 15 08/12/2024   AST 24 08/12/2024   ALKPHOS 47 08/12/2024   BILITOT <0.2 08/12/2024     Microbiology: Recent Results (from the past 240 hours)  Blood culture (routine x 2)     Status: None (Preliminary result)   Collection Time: 08/12/24  1:41 AM   Specimen: BLOOD  Result Value Ref Range Status   Specimen Description BLOOD SITE NOT SPECIFIED  Final   Special Requests   Final    BOTTLES DRAWN AEROBIC AND ANAEROBIC Blood Culture results may not be optimal due to an inadequate volume of blood received in culture bottles   Culture   Final    NO GROWTH < 12 HOURS Performed at [facility], [address] [place], [place] [address]    Report Status PENDING  Incomplete  Blood culture (routine x 2)     Status: None (Preliminary result)   Collection Time: 08/12/24  2:43 AM   Specimen: BLOOD RIGHT ARM  Result Value Ref Range Status   Specimen Description BLOOD RIGHT ARM  Final   Special Requests  Final    BOTTLES DRAWN AEROBIC AND  ANAEROBIC Blood Culture adequate volume   Culture   Final    NO GROWTH < 12 HOURS Performed at [facility], [address] [place], [place] [address]    Report Status PENDING  Incomplete    Jomarie Fleeta Rothman, MD [facility] [phone] pager  08/12/2024, 12:27 PM

## 2024-08-13 DIAGNOSIS — F250 Schizoaffective disorder, bipolar type: Secondary | ICD-10-CM | POA: Diagnosis not present

## 2024-08-13 DIAGNOSIS — S22009D Unspecified fracture of unspecified thoracic vertebra, subsequent encounter for fracture with routine healing: Secondary | ICD-10-CM

## 2024-08-13 DIAGNOSIS — A419 Sepsis, unspecified organism: Secondary | ICD-10-CM

## 2024-08-13 DIAGNOSIS — I358 Other nonrheumatic aortic valve disorders: Secondary | ICD-10-CM | POA: Diagnosis not present

## 2024-08-13 DIAGNOSIS — R233 Spontaneous ecchymoses: Secondary | ICD-10-CM | POA: Diagnosis not present

## 2024-08-13 LAB — CBC
HCT: 30.5 % — ABNORMAL LOW (ref 36.0–46.0)
Hemoglobin: 9.6 g/dL — ABNORMAL LOW (ref 12.0–15.0)
MCH: 25.3 pg — ABNORMAL LOW (ref 26.0–34.0)
MCHC: 31.5 g/dL (ref 30.0–36.0)
MCV: 80.5 fL (ref 80.0–100.0)
Platelets: 341 K/uL (ref 150–400)
RBC: 3.79 MIL/uL — ABNORMAL LOW (ref 3.87–5.11)
RDW: 19.4 % — ABNORMAL HIGH (ref 11.5–15.5)
WBC: 14.4 K/uL — ABNORMAL HIGH (ref 4.0–10.5)
nRBC: 0.8 % — ABNORMAL HIGH (ref 0.0–0.2)

## 2024-08-13 LAB — BASIC METABOLIC PANEL WITH GFR
Anion gap: 13 (ref 5–15)
BUN: 5 mg/dL — ABNORMAL LOW (ref 6–20)
CO2: 23 mmol/L (ref 22–32)
Calcium: 8.4 mg/dL — ABNORMAL LOW (ref 8.9–10.3)
Chloride: 105 mmol/L (ref 98–111)
Creatinine, Ser: 0.39 mg/dL — ABNORMAL LOW (ref 0.44–1.00)
GFR, Estimated: >60 mL/min (ref >60)
Glucose, Bld: 176 mg/dL — ABNORMAL HIGH (ref 70–99)
Potassium: 4.1 mmol/L (ref 3.5–5.1)
Sodium: 141 mmol/L (ref 135–145)

## 2024-08-13 LAB — PROTIME-INR
INR: 1.1 (ref 0.8–1.2)
Prothrombin Time: 14.7 s (ref 11.4–15.2)

## 2024-08-13 LAB — C-REACTIVE PROTEIN: CRP: 0.8 mg/dL (ref <1.0)

## 2024-08-13 LAB — HEPATITIS A ANTIBODY, TOTAL: hep A Total Ab: REACTIVE — AB

## 2024-08-13 NOTE — Evaluation (Signed)
 Physical Therapy Evaluation Patient Details Name: Alexandria Harvey MRN: [id number] DOB: [name] 30, 1981 Today's Date: 08/13/2024  History of Present Illness  43 y.o. female admitted 08/11/24 for petechial rash suspect adverse drug reaction to Zosyn . 12/6 (+) DVT in BLE. PMHx: intellectual disability, schizophrenia, dropped head syndrome, HTN, seizures, diabetes, hx of C3, C5, C7 fxs with central cord syndrome, hx of T4-T5 fxs, and L foot/ankle fx.   Clinical Impression  Pt admitted with above diagnosis. Examination limited by pt being a poor historian and lack of family/caregiver present. Unsure of pt's PLOF. Per chart review, pt has been residing at [facility]. Per RN, pt has been bed-level for the past 3 months and has been receiving therapy services. Pt currently with functional limitations due to the deficits listed below (see PT Problem List). She required totalA x2 to roll in bed. Pt is currently limited by impaired cognition, generalized pain, cervical precautions, immobility (CTO brace at all times), emotional lability, anxiety, and impaired activity tolerance. Pt will benefit from acute skilled PT to maintain ROM/strength and decrease caregiver burden. Recommend continued inpatient follow up therapy, <3 hours/day.    If plan is discharge home, recommend the following: Two people to help with walking and/or transfers;Two people to help with bathing/dressing/bathroom;Assistance with cooking/housework;Assist for transportation;Help with stairs or ramp for entrance;Supervision due to cognitive status;Direct supervision/assist for medications management   Can travel by private vehicle   No    Equipment Recommendations Hospital bed;Hoyer lift;Other (comment) (air mattress; tilt-n-space w/c)  Recommendations for Other Services       Functional Status Assessment Patient has had a recent decline in their functional status and/or demonstrates limited ability to make significant improvements  in function in a reasonable and predictable amount of time     Precautions / Restrictions Precautions Precautions: Cervical;Fall Precaution Booklet Issued: No Recall of Precautions/Restrictions: Impaired Required Braces or Orthoses: Cervical Brace Cervical Brace: Hard collar;At all times Restrictions Weight Bearing Restrictions Per Provider Order: No      Mobility  Bed Mobility Overal bed mobility: Needs Assistance Bed Mobility: Rolling Rolling: Total assist, +2 for physical assistance         General bed mobility comments: Rolled pt R/L in bed via bed pad and +2 total A. Pt unable to participate. Attempted to guide pt's UE to rail and bend opposite knee to help push into turning on her side. No active engagement noted likely d/t pain and weakness. Repositioned pt in bed using bed features and +2 assist.    Transfers Overall transfer level: Needs assistance                 General transfer comment: Deferred d/t worsening pain. Recommend maximove to transfer OOB with +2 assist.    Ambulation/Gait                  Stairs            Wheelchair Mobility     Tilt Bed    Modified Rankin (Stroke Patients Only)       Balance Overall balance assessment: Needs assistance (Session remained bed level)                                           Pertinent Vitals/Pain Pain Assessment Pain Assessment: Faces Faces Pain Scale: Hurts whole lot Pain Location: Everywhere with any touch/movement Pain Descriptors / Indicators: Grimacing,  Moaning, Crying Pain Intervention(s): Monitored during session, Limited activity within patient's tolerance, Repositioned    Home Living Family/patient expects to be discharged to:: Skilled nursing facility                   Additional Comments: Pt could not provide. Per chart review, pt previously resided at a Group Home but went to a [facility]    Prior Function Prior Level of Function  : Patient poor historian/Family not available;Needs assist             Mobility Comments: Pt could not provide; Per RN, pt has been bedbound for the past ~22mo ADLs Comments: Pt could not provide; Per RN, pt has been bedbound for the past ~82mo     Extremity/Trunk Assessment   Upper Extremity Assessment Upper Extremity Assessment: RUE deficits/detail;LUE deficits/detail RUE Deficits / Details: Pt unable to lift arm, bend elbow, or move wrist. She was able to slightly grip hand. Attempted PROM, but pt began to scream c/o pain. Deferred further motion attempts. RUE: Unable to fully assess due to pain (She appears to have hyperesthesia.) RUE Sensation: decreased light touch;decreased proprioception RUE Coordination: decreased fine motor;decreased gross motor LUE Deficits / Details: Pt unable to lift arm, bend elbow, or move wrist. She was able to slightly grip hand. Attempted PROM, but pt began to scream c/o pain. Deferred further motion attempts. LUE: Unable to fully assess due to pain (She appears to have hyperesthesia.) LUE Sensation: decreased light touch;decreased proprioception LUE Coordination: decreased fine motor;decreased gross motor    Lower Extremity Assessment Lower Extremity Assessment: RLE deficits/detail;LLE deficits/detail RLE Deficits / Details: Pt unable to wiggle toes, move ankle, bend knee, or slide leg. Attempted PROM, but pt began to scream c/o pain. Deferred further motion attempts. RLE: Unable to fully assess due to pain (She appears to have hyperesthesia.) RLE Sensation: decreased light touch;decreased proprioception RLE Coordination: decreased gross motor LLE Deficits / Details: Pt unable to move ankle, bend knee, or slide leg. She slightly wiggled her toes. Attempted PROM, but pt began to scream c/o pain. Deferred further motion attempts. LLE: Unable to fully assess due to pain (She appears to have hyperesthesia.) LLE Sensation: decreased light touch;decreased  proprioception LLE Coordination: decreased gross motor    Cervical / Trunk Assessment Cervical / Trunk Assessment: Other exceptions Cervical / Trunk Exceptions: C3, C5, C7 fxs in CTO brace  Communication   Communication Communication: Impaired Factors Affecting Communication: Reduced clarity of speech    Cognition Arousal: Alert Behavior During Therapy: Flat affect, Lability, Anxious   PT - Cognitive impairments: History of cognitive impairments, No family/caregiver present to determine baseline (Pt with intellectual disability)                       PT - Cognition Comments: Pt highly nervous to participate in PT. She kept reported she was scared, but couldn't expand on what she was fearful of. Provided education and reassurement. Gave step by step instructions and described what we were going to do before it happened. Following commands: Impaired Following commands impaired: Follows one step commands inconsistently, Follows one step commands with increased time     Cueing Cueing Techniques: Verbal cues, Gestural cues, Tactile cues     General Comments General comments (skin integrity, edema, etc.): Pt with documented decubitus ulcer on sacrum. Rash on BLE.    Exercises     Assessment/Plan    PT Assessment Patient needs continued PT services  PT Problem List Decreased strength;Decreased range of motion;Decreased activity tolerance;Decreased balance;Decreased mobility;Decreased cognition;Pain;Decreased skin integrity       PT Treatment Interventions Functional mobility training;Therapeutic activities;Therapeutic exercise;Balance training;Cognitive remediation;Patient/family education;Wheelchair mobility training    PT Goals (Current goals can be found in the Care Plan section)  Acute Rehab PT Goals PT Goal Formulation: Patient unable to participate in goal setting Time For Goal Achievement: 08/27/24 Potential to Achieve Goals: Fair    Frequency Min 1X/week      Co-evaluation               AM-PAC PT 6 Clicks Mobility  Outcome Measure Help needed turning from your back to your side while in a flat bed without using bedrails?: Total Help needed moving from lying on your back to sitting on the side of a flat bed without using bedrails?: Total Help needed moving to and from a bed to a chair (including a wheelchair)?: Total Help needed standing up from a chair using your arms (e.g., wheelchair or bedside chair)?: Total Help needed to walk in hospital room?: Total Help needed climbing 3-5 steps with a railing? : Total 6 Click Score: 6    End of Session   Activity Tolerance: Patient limited by pain Patient left: in bed;with call bell/phone within reach;with bed alarm set Nurse Communication: Mobility status;Need for lift equipment;Other (comment) (Need for bed to be changed to an air mattress. Recommend for PRAFO boots. Maximove to transfer.) PT Visit Diagnosis: Pain;Other symptoms and signs involving the nervous system (R29.898);Muscle weakness (generalized) (M62.81);Other abnormalities of gait and mobility (R26.89)    Time: 8966-8948 PT Time Calculation (min) (ACUTE ONLY): 18 min   Charges:   PT Evaluation $PT Eval Moderate Complexity: 1 Mod   PT General Charges $$ ACUTE PT VISIT: 1 Visit         Randall SAUNDERS, PT, DPT Acute Rehabilitation Services Office: [phone] Secure Chat Preferred  Delon CHRISTELLA Callander 08/13/2024, 1:02 PM

## 2024-08-13 NOTE — Hospital Course (Signed)
 43 year old female- Under guardianship of care for DSS-phone #[phone]--- apparently also has a caregiver [name]/[name] who is the group home, DM, mental retardation, Schizoaffective disorder, previous hallucination, sacral decubitus ulcer, cervical fracture with collar - p/w fever and encephalopathy.   Assessment and Plan:   Concern for sepsis - Encephalopathy, leukocytosis, lactic acidosis, given concern for sepsis.  Etiology unclear possible sacral decubitus ulcer versus PICC line infection.  Blood cultures, IV fluid bolus, empiric antibiotics initiated at presentation.  Monitor blood pressure closely.  Evaluated by infectious disease.  Antibiotics stopped.  Currently on line holiday.  Repeating blood cultures 12/8 & 12/9 NGTD.   Acute metabolic encephalopathy - Multifactorial etiology but appears to be improving.  Polypharmacy, dehydration, possible sepsis.  Medications weaned back.  Appears to be showing improvement.  Patient alert, communicative.  Resides at group home.  Monitor closely.   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  Evaluated by infectious disease.  Only minimal concern for infectious etiology, other differentials include Libman-Sacks.  Pending autoimmune workup.  Follow-up blood cultures closely.  Will consult cardiology.     Acute rash - Etiology unclear but thought to be possible drug reaction versus excoriations from bedbugs.  Antibiotics stopped.  Contact precautions.  Will continue monitor closely.  Appears to be improving.   Diabetes mellitus - Insulin  sliding scale on board.   History of cervical fractures with polytrauma - Remains in c-collar.  Follow-up with Dr. Nundkumar-minimize meds as above because of mild encephalopathy.   Acute DVT BL LE - Lower extremity Dopplers noting BL proximal femoral vein DVTs.  Continue Eliquis .  Unclear if provoked or unprovoked.  Pending hypercoagulable workup.   Severe constipation - Noted incidentally on CT  scan.  Bowel regimen on board.  No abdominal pain noted.   Cognitive deficits/schizophrenia - Resides at a group home.  guardianship of care for DSS-phone #[phone]--- apparently also has a caregiver [name]/[name] who is the group home.  Patient evaluated by psychiatry.  Medication recommendations per psych.   Goals of care - Awaiting definitive track of care, infectious versus autoimmune.  Likely patient to return to group home afterwards.  TOC following closely.

## 2024-08-13 NOTE — Progress Notes (Addendum)
 Progress Note   Patient: Alexandria Harvey FMW:[id number] DOB: May 05, 1981 DOA: 08/11/2024  DOS: the patient was seen and examined on 08/13/2024   Brief hospital course:  43 year old female- Under guardianship of care for DSS-phone #[phone]--- apparently also has a caregiver [name]/[name] who is the group home, DM, mental retardation, Schizoaffective disorder, previous hallucination, sacral decubitus ulcer, cervical fracture with collar - p/w fever and encephalopathy.  Assessment and Plan:  Concern for sepsis - Encephalopathy, leukocytosis, lactic acidosis, given concern for sepsis.  Etiology unclear possible sacral decubitus ulcer versus PICC line infection.  Blood cultures, IV fluid bolus, empiric antibiotics initiated at presentation.  Monitor blood pressure closely.  Evaluated by infectious disease.  Antibiotics since stopped.  Currently on line holiday.  Repeating blood cultures today or tomorrow.  Acute metabolic encephalopathy - Multifactorial etiology but appears to be improving.  Polypharmacy, dehydration, possible sepsis.  Medications weaned back.  Appears to be showing improvement.  Patient alert, communicative.  Resides at group home.  Monitor closely.  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  Evaluated by infectious disease.  Only minimal concern for infectious etiology, other differentials include Libman-Sacks.  Follow-up blood cultures closely.  Acute rash - Etiology unclear but thought to be possible drug reaction.  Antibiotics stopped.  Will continue monitor closely.  Diabetes mellitus - Insulin  sliding scale on board.  History of cervical fractures with polytrauma - Remains in c-collar.  Follow-up with Dr. Nundkumar-minimize meds as above because of mild encephalopathy.  Acute DVT BL LE - Lower extremity Dopplers noting BL proximal femoral vein DVTs.  Continue Eliquis .  Unclear if provoked or unprovoked.  Pending hypercoagulable workup.  Severe  constipation - Noted incidentally on CT scan.  Bowel regimen on board.  No abdominal pain noted.  Cognitive deficits/schizophrenia - Resides at a group home.  guardianship of care for DSS-phone #[phone]--- apparently also has a caregiver [name]/[name] who is the group home  Subjective: Patient sitting up this morning, alert, talkative.  Denies fever, shortness of breath, chest pain, nausea, vomiting, abdominal pain.  Eager to eat breakfast.  No reported fever overnight.  Reportedly having difficulty with lab draws this morning.  Physical Exam:  Vitals:   08/12/24 2002 08/13/24 0043 08/13/24 0436 08/13/24 0748  BP: 138/89 126/75 115/70 117/73  Pulse: 89 92 85 89  Resp: 16 16 16 18   Temp: 97.8 F (36.6 C) 98 F (36.7 C) 97.6 F (36.4 C) 98.2 F (36.8 C)  TempSrc: Oral Oral Oral   SpO2: 99% 98% 98% 96%  Weight:      Height:        GENERAL:  Alert, pleasant, no acute distress  HEENT:  EOMI, c-collar CARDIOVASCULAR:  RRR, no murmurs appreciated RESPIRATORY:  Clear to auscultation, no wheezing, rales, or rhonchi GASTROINTESTINAL:  Soft, nontender, nondistended EXTREMITIES:  No LE edema bilaterally NEURO:  No new focal deficits appreciated SKIN:  No rashes noted PSYCH:  Appropriate mood and affect     Data Reviewed:  Imaging Studies: VAS US  UPPER EXTREMITY VENOUS DUPLEX Result Date: 08/12/2024 UPPER VENOUS STUDY  Patient Name:  Alexandria Harvey  Date of Exam:   08/12/2024 Medical Rec #: [id number]         Accession #:    [id number] Date of Birth: Feb 08, 1981        Patient Gender: F Patient Age:   14 years Exam Location:  [facility] Procedure:      VAS US  UPPER EXTREMITY  VENOUS DUPLEX Referring Phys: COLEN GRIMES --------------------------------------------------------------------------------  Indications: Edema, and recent fall. Limitations: Musculoskeletal features and patient contorted and C-collar. Comparison Study: No prior exam. Performing Technologist:  Edilia Elden Appl  Examination Guidelines: A complete evaluation includes B-mode imaging, spectral Doppler, color Doppler, and power Doppler as needed of all accessible portions of each vessel. Bilateral testing is considered an integral part of a complete examination. Limited examinations for reoccurring indications may be performed as noted.  Right Findings: +----------+------------+---------+-----------+----------+--------------+ RIGHT     CompressiblePhasicitySpontaneousProperties   Summary     +----------+------------+---------+-----------+----------+--------------+ IJV                                                 Not visualized +----------+------------+---------+-----------+----------+--------------+ Subclavian               Yes       Yes                             +----------+------------+---------+-----------+----------+--------------+ Axillary      Full       Yes       Yes                             +----------+------------+---------+-----------+----------+--------------+ Brachial      Full       Yes       Yes                             +----------+------------+---------+-----------+----------+--------------+ Radial        Full                                                 +----------+------------+---------+-----------+----------+--------------+ Ulnar         Full                                                 +----------+------------+---------+-----------+----------+--------------+ Cephalic      Full       Yes       Yes                             +----------+------------+---------+-----------+----------+--------------+ Basilic       Full       Yes       Yes                             +----------+------------+---------+-----------+----------+--------------+ Right internal jugular vein not visualized due to C-collar. Limited examination due to patient being contorted and not being able to properly position. Best obtainable images.   Left Findings: +----------+------------+---------+-----------+----------+--------------+ LEFT      CompressiblePhasicitySpontaneousProperties   Summary     +----------+------------+---------+-----------+----------+--------------+ IJV  Not visualized +----------+------------+---------+-----------+----------+--------------+ Subclavian               Yes       Yes                             +----------+------------+---------+-----------+----------+--------------+ Axillary      Full       Yes       Yes                             +----------+------------+---------+-----------+----------+--------------+ Brachial      Full                                                 +----------+------------+---------+-----------+----------+--------------+ Radial        Full                                                 +----------+------------+---------+-----------+----------+--------------+ Ulnar                                               Not visualized +----------+------------+---------+-----------+----------+--------------+ Cephalic      Full       Yes       Yes                             +----------+------------+---------+-----------+----------+--------------+ Basilic       Full       Yes       Yes                             +----------+------------+---------+-----------+----------+--------------+ Left internal jugular vein not visualized due to C-collar and left ulnar vein unaccessible. Limited examination due to patient being contorted and not being able to properly position. Best obtainable images.  Summary:  Right: No evidence of deep vein thrombosis in the upper extremity. No evidence of superficial vein thrombosis in the upper extremity.  Left: No evidence of deep vein thrombosis in the upper extremity. No evidence of superficial vein thrombosis in the upper extremity.  *See table(s) above for measurements and  observations.  Diagnosing physician: Debby Robertson Electronically signed by Debby Robertson on 08/12/2024 at 8:12:40 PM.    Final    VAS US  LOWER EXTREMITY VENOUS (DVT) Result Date: 08/12/2024  Lower Venous DVT Study Patient Name:  Alexandria Harvey  Date of Exam:   08/12/2024 Medical Rec #: [id number]         Accession #:    [id number] Date of Birth: 1980/09/23        Patient Gender: F Patient Age:   62 years Exam Location:  [facility] Procedure:      VAS US  LOWER EXTREMITY VENOUS (DVT) Referring Phys: COLEN GRIMES --------------------------------------------------------------------------------  Indications: Swelling, Edema, and recent fall.  Comparison Study: No prior exam. Performing Technologist: Edilia Elden Appl  Examination Guidelines: A complete evaluation includes B-mode imaging, spectral Doppler, color Doppler, and power Doppler  as needed of all accessible portions of each vessel. Bilateral testing is considered an integral part of a complete examination. Limited examinations for reoccurring indications may be performed as noted. The reflux portion of the exam is performed with the patient in reverse Trendelenburg.  +---------+---------------+---------+-----------+----------+-------------------+ RIGHT    CompressibilityPhasicitySpontaneityPropertiesThrombus Aging      +---------+---------------+---------+-----------+----------+-------------------+ CFV      Partial        Yes      Yes                                      +---------+---------------+---------+-----------+----------+-------------------+ SFJ      Full           Yes      Yes                                      +---------+---------------+---------+-----------+----------+-------------------+ FV Prox  Partial                                                          +---------+---------------+---------+-----------+----------+-------------------+ FV Mid   None           Yes      Yes                   Not well visualized +---------+---------------+---------+-----------+----------+-------------------+ FV DistalFull           Yes      Yes                                      +---------+---------------+---------+-----------+----------+-------------------+ PFV      Partial        Yes      Yes                                      +---------+---------------+---------+-----------+----------+-------------------+ POP      Full           Yes      Yes                                      +---------+---------------+---------+-----------+----------+-------------------+ PTV                                                   Not well                                                                  visualized, patent  by color.           +---------+---------------+---------+-----------+----------+-------------------+ PERO                                                  Not well                                                                  visualized, patent                                                        by color.           +---------+---------------+---------+-----------+----------+-------------------+ EIV                     Yes      Yes                                      +---------+---------------+---------+-----------+----------+-------------------+ Deep vein thrombosis noted in the right common femoral the proximal and middle femoral veins and the profunda vein.  +---------+---------------+---------+-----------+----------+-------------------+ LEFT     CompressibilityPhasicitySpontaneityPropertiesThrombus Aging      +---------+---------------+---------+-----------+----------+-------------------+ CFV      Partial        Yes      Yes                                      +---------+---------------+---------+-----------+----------+-------------------+ SFJ      Full            Yes      Yes                                      +---------+---------------+---------+-----------+----------+-------------------+ FV Prox  None           No       No                                       +---------+---------------+---------+-----------+----------+-------------------+ FV Mid   None           No       No                                       +---------+---------------+---------+-----------+----------+-------------------+ FV DistalFull           Yes      Yes                                      +---------+---------------+---------+-----------+----------+-------------------+  PFV      Partial        Yes      Yes                                      +---------+---------------+---------+-----------+----------+-------------------+ POP      Full           Yes      Yes                                      +---------+---------------+---------+-----------+----------+-------------------+ PTV                                                   Not well                                                                  visualized, patent                                                        by color.           +---------+---------------+---------+-----------+----------+-------------------+ PERO                                                  Not well                                                                  visualized, patent                                                        by color.           +---------+---------------+---------+-----------+----------+-------------------+ EIV                     Yes      Yes                                      +---------+---------------+---------+-----------+----------+-------------------+ Deep vein thrombosis noted in the left common femoral the proximal and middle femoral veins and the profunda vein.    Summary: RIGHT: - Findings consistent with acute deep vein thrombosis  involving the right common femoral  vein, right femoral vein, and right proximal profunda vein.  - No cystic structure found in the popliteal fossa.  LEFT: - Findings consistent with acute deep vein thrombosis involving the left common femoral vein, left femoral vein, and left proximal profunda vein.  - No cystic structure found in the popliteal fossa.  *See table(s) above for measurements and observations. Electronically signed by Debby Robertson on 08/12/2024 at 8:12:15 PM.    Final    ECHOCARDIOGRAM COMPLETE Result Date: 08/12/2024    ECHOCARDIOGRAM REPORT   Patient Name:   Alexandria Harvey Date of Exam: 08/12/2024 Medical Rec #:  [id number]        Height:       62.0 in Accession #:    [id number]       Weight:       154.8 lb Date of Birth:  December 03, 1980       BSA:          1.714 m Patient Age:    43 years         BP:           117/77 mmHg Patient Gender: F                HR:           78 bpm. Exam Location:  Inpatient Procedure: 2D Echo, Color Doppler, Cardiac Doppler and Intracardiac            Opacification Agent (Both Spectral and Color Flow Doppler were            utilized during procedure). STAT ECHO Indications:    Endocarditis  History:        Patient has no prior history of Echocardiogram examinations.                 Risk Factors:Hypertension, Diabetes and Dyslipidemia. GERD.  Sonographer:    Logan Shove RDCS Referring Phys: [phone] [facility]  Sonographer Comments: Suboptimal parasternal window. Body Position. IMPRESSIONS  1. Left ventricular ejection fraction, by estimation, is 60 to 65%. The left ventricle has normal function. The left ventricle has no regional wall motion abnormalities. Left ventricular diastolic parameters were normal.  2. Right ventricular systolic function is normal. The right ventricular size is normal.  3. The mitral valve is normal in structure. Trivial mitral valve regurgitation. No evidence of mitral stenosis.  4. Rounded echodensity (6 mm X 8 mm) on the ventricular side of  (predominantly) the right coronoary cusp leaflet. No abscess is appreciated. The aortic valve is abnormal. Aortic valve regurgitation is not visualized. Comparison(s): No prior Echocardiogram. Conclusion(s)/Recommendation(s): In the clinical picture of bactermia, the study could be consistent with infective endocarditis. Consider TEE if clinically indicated and within goals of care. FINDINGS  Left Ventricle: Left ventricular ejection fraction, by estimation, is 60 to 65%. The left ventricle has normal function. The left ventricle has no regional wall motion abnormalities. Definity  contrast agent was given IV to delineate the left ventricular  endocardial borders. The left ventricular internal cavity size was normal in size. There is no left ventricular hypertrophy. Left ventricular diastolic parameters were normal. Right Ventricle: The right ventricular size is normal. No increase in right ventricular wall thickness. Right ventricular systolic function is normal. Left Atrium: Left atrial size was normal in size. Right Atrium: Right atrial size was normal in size. Pericardium: There is no evidence of pericardial effusion. Mitral Valve: The mitral valve is normal in structure. Trivial mitral valve regurgitation. No evidence of mitral valve  stenosis. Tricuspid Valve: The tricuspid valve is normal in structure. Tricuspid valve regurgitation is not demonstrated. No evidence of tricuspid stenosis. Aortic Valve: Rounded echodensity (6 mm X 8 mm) on the ventricular side of (predominantly) the right coronoary cusp leaflet. No abscess is appreciated. The aortic valve is abnormal. Aortic valve regurgitation is not visualized. Aortic valve peak gradient  measures 10.5 mmHg. Pulmonic Valve: The pulmonic valve was normal in structure. Pulmonic valve regurgitation is trivial. No evidence of pulmonic stenosis. Aorta: The aortic root and ascending aorta are structurally normal, with no evidence of dilitation. IAS/Shunts: No atrial  level shunt detected by color flow Doppler.  LEFT VENTRICLE PLAX 2D LVIDd:         4.70 cm      Diastology LVIDs:         3.10 cm      LV e' medial:    9.14 cm/s LV PW:         0.70 cm      LV E/e' medial:  9.6 LV IVS:        0.70 cm      LV e' lateral:   15.60 cm/s LVOT diam:     2.00 cm      LV E/e' lateral: 5.6 LVOT Area:     3.14 cm  LV Volumes (MOD) LV vol d, MOD A2C: 81.3 ml LV vol d, MOD A4C: 107.0 ml LV vol s, MOD A2C: 33.3 ml LV vol s, MOD A4C: 40.1 ml LV SV MOD A2C:     48.0 ml LV SV MOD A4C:     107.0 ml LV SV MOD BP:      55.8 ml RIGHT VENTRICLE             IVC RV Basal diam:  3.20 cm     IVC diam: 1.40 cm RV S prime:     14.10 cm/s TAPSE (M-mode): 1.8 cm      PULMONARY VEINS                             Diastolic Velocity: 31.20 cm/s                             S/D Velocity:       1.90                             Systolic Velocity:  59.70 cm/s LEFT ATRIUM             Index        RIGHT ATRIUM           Index LA diam:        3.00 cm 1.75 cm/m   RA Area:     10.40 cm LA Vol (A2C):   51.8 ml 30.22 ml/m  RA Volume:   20.10 ml  11.72 ml/m LA Vol (A4C):   31.9 ml 18.61 ml/m LA Biplane Vol: 40.6 ml 23.68 ml/m  AORTIC VALVE AV Area (Vmax): 2.27 cm AV Vmax:        162.00 cm/s AV Peak Grad:   10.5 mmHg LVOT Vmax:      117.00 cm/s  AORTA Ao Root diam: 2.00 cm Ao Asc diam:  2.70 cm MITRAL VALVE MV Area (PHT): 4.21 cm    SHUNTS MV Decel Time: 180 msec    Systemic  Diam: 2.00 cm MV E velocity: 87.40 cm/s MV A velocity: 80.30 cm/s MV E/A ratio:  1.09 Stanly Leavens MD Electronically signed by Stanly Leavens MD Signature Date/Time: 08/12/2024/10:18:45 AM    Final    DG Chest Portable 1 View Result Date: 08/12/2024 EXAM: 1 VIEW(S) XRAY OF THE CHEST 08/12/2024 05:55:00 AM COMPARISON: 06/11/2024 CLINICAL HISTORY: sepsis FINDINGS: LINES, TUBES AND DEVICES: Left PICC line in place. LUNGS AND PLEURA: Hypoinflated lungs. Asymmetric elevation of the right hemidiaphragm, unchanged. No pleural effusion. No  pneumothorax. HEART AND MEDIASTINUM: No acute abnormality of the cardiac and mediastinal silhouettes. BONES AND SOFT TISSUES: Degenerative changes are identified at the glenohumeral joint. 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 IMPRESSION: 1. Hypoinflated lungs with asymmetric elevation of the right hemidiaphragm, unchanged. 2. Left PICC line in appropriate position. Electronically signed by: Waddell Calk MD 08/12/2024 06:13 AM EST RP Workstation: [id number]   CT ABDOMEN PELVIS W CONTRAST Result Date: 08/12/2024 EXAM: CT ABDOMEN AND PELVIS WITH CONTRAST 08/12/2024 04:11:48 AM TECHNIQUE: CT of the abdomen and pelvis was performed with the administration of 75 mL of iohexol  (OMNIPAQUE ) 350 MG/ML injection. Multiplanar reformatted images are provided for review. Automated exposure control, iterative reconstruction, and/or weight-based adjustment of the mA/kV was utilized to reduce the radiation dose to as low as reasonably achievable. COMPARISON: Findings are compared to 05/23/2024. CLINICAL HISTORY: Sepsis; sacral wound. FINDINGS: LOWER CHEST: Central venous catheter seen at the superior cavoatrial junction. Moderate multilevel coronary artery calcifications. LIVER: The liver is unremarkable. GALLBLADDER AND BILE DUCTS: Gallbladder is unremarkable. No biliary ductal dilatation. SPLEEN: No acute abnormality. PANCREAS: No acute abnormality. ADRENAL GLANDS: No acute abnormality. KIDNEYS, URETERS AND BLADDER: No stones in the kidneys or ureters. No hydronephrosis. No perinephric or periureteral stranding. Urinary bladder is unremarkable. GI AND BOWEL: Large stool burden without evidence of obstruction. The rectosigmoid colon, however, is relatively decompressed. Appendix normal. The stomach, small bowel, and large bowel are otherwise unremarkable. PERITONEUM AND RETROPERITONEUM: No ascites. No free air. VASCULATURE: Mild aortoiliac atherosclerotic  calcification. No aortic aneurysm. LYMPH NODES: No lymphadenopathy. REPRODUCTIVE ORGANS: No acute abnormality. BONES AND SOFT TISSUES: Since the prior examination, a sacral decubitus wound has developed, which approaches the cortex of the subjacent sacrum (75/3) in keeping with a grade 3-4 sacral decubitus wound. No osseous erosion to indicate superimposed osteomyelitis. 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 No acute bone abnormality. IMPRESSION: 1. 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 2. Large stool burden without evidence of obstruction, with relatively decompressed rectosigmoid colon. Electronically signed by: Dorethia Molt MD 08/12/2024 04:43 AM EST RP Workstation: [id number]    Results are pending, will review when available.  Previous records (including but not limited to H&P, progress notes, nursing notes, TOC management) were reviewed in assessment of this patient.  Labs: CBC: Recent Labs  Lab 08/12/24 0141  WBC 14.3*  NEUTROABS 6.1  HGB 9.6*  HCT 30.3*  MCV 80.4  PLT 278   Basic Metabolic Panel: Recent Labs  Lab 08/12/24 0141  NA 137  K 3.6  CL 100  CO2 22  GLUCOSE 129*  BUN 10  CREATININE 0.46  CALCIUM  8.7*   Liver Function Tests: Recent Labs  Lab 08/12/24 0141  AST 24  ALT 15  ALKPHOS 47  BILITOT <0.2  PROT 5.5*  ALBUMIN 2.0*   CBG: No results for input(s):  GLUCAP in the last 168 hours.  Scheduled Meds:  apixaban   5 mg Oral BID   cloZAPine   50 mg Oral BID   collagenase    Topical Daily   diphenhydrAMINE -zinc  acetate   Topical BID   divalproex   250 mg Oral TID   gabapentin   100 mg Oral TID   polyethylene glycol  17 g Oral BID   senna-docusate  1 tablet Oral Daily   Continuous Infusions: PRN Meds:.acetaminophen  **OR** acetaminophen , ondansetron  **OR** ondansetron  (ZOFRAN ) IV, oxyCODONE   Family  Communication: None at bedside  Disposition: Status is: Inpatient Remains inpatient appropriate because: Encephalopathy, sepsis rule out     Time spent: 40 minutes  Length of inpatient stay: 1 days  Author: Carliss LELON Canales, DO 08/13/2024 11:51 AM  For on call review [email or web address].

## 2024-08-13 NOTE — Plan of Care (Signed)
  Problem: Education: Goal: Knowledge of General Education information will improve Description: Including pain rating scale, medication(s)/side effects and non-pharmacologic comfort measures 08/13/2024 2153 by Graciela Theora LABOR, RN Outcome: Progressing 08/13/2024 2153 by Graciela Theora LABOR, RN Outcome: Progressing   Problem: Health Behavior/Discharge Planning: Goal: Ability to manage health-related needs will improve 08/13/2024 2153 by Graciela Theora LABOR, RN Outcome: Progressing 08/13/2024 2153 by Graciela Theora LABOR, RN Outcome: Progressing   Problem: Clinical Measurements: Goal: Ability to maintain clinical measurements within normal limits will improve 08/13/2024 2153 by Graciela Theora LABOR, RN Outcome: Progressing 08/13/2024 2153 by Graciela Theora LABOR, RN Outcome: Progressing Goal: Will remain free from infection 08/13/2024 2153 by Graciela Theora LABOR, RN Outcome: Progressing 08/13/2024 2153 by Graciela Theora LABOR, RN Outcome: Progressing Goal: Diagnostic test results will improve 08/13/2024 2153 by Graciela Theora LABOR, RN Outcome: Progressing 08/13/2024 2153 by Graciela Theora LABOR, RN Outcome: Progressing Goal: Respiratory complications will improve 08/13/2024 2153 by Graciela Theora LABOR, RN Outcome: Progressing 08/13/2024 2153 by Graciela Theora LABOR, RN Outcome: Progressing Goal: Cardiovascular complication will be avoided 08/13/2024 2153 by Graciela Theora LABOR, RN Outcome: Progressing 08/13/2024 2153 by Graciela Theora LABOR, RN Outcome: Progressing   Problem: Activity: Goal: Risk for activity intolerance will decrease 08/13/2024 2153 by Graciela Theora LABOR, RN Outcome: Progressing 08/13/2024 2153 by Graciela Theora LABOR, RN Outcome: Progressing   Problem: Nutrition: Goal: Adequate nutrition will be maintained 08/13/2024 2153 by Graciela Theora LABOR, RN Outcome: Progressing 08/13/2024 2153 by Graciela Theora LABOR, RN Outcome: Progressing   Problem: Coping: Goal: Level of anxiety will decrease 08/13/2024 2153 by Graciela Theora LABOR, RN Outcome:  Progressing 08/13/2024 2153 by Graciela Theora LABOR, RN Outcome: Progressing   Problem: Elimination: Goal: Will not experience complications related to bowel motility 08/13/2024 2153 by Graciela Theora LABOR, RN Outcome: Progressing 08/13/2024 2153 by Graciela Theora LABOR, RN Outcome: Progressing Goal: Will not experience complications related to urinary retention 08/13/2024 2153 by Graciela Theora LABOR, RN Outcome: Progressing 08/13/2024 2153 by Graciela Theora LABOR, RN Outcome: Progressing   Problem: Pain Managment: Goal: General experience of comfort will improve and/or be controlled 08/13/2024 2153 by Graciela Theora LABOR, RN Outcome: Progressing 08/13/2024 2153 by Graciela Theora LABOR, RN Outcome: Progressing   Problem: Safety: Goal: Ability to remain free from injury will improve 08/13/2024 2153 by Graciela Theora LABOR, RN Outcome: Progressing 08/13/2024 2153 by Graciela Theora LABOR, RN Outcome: Progressing   Problem: Skin Integrity: Goal: Risk for impaired skin integrity will decrease 08/13/2024 2153 by Graciela Theora LABOR, RN Outcome: Progressing 08/13/2024 2153 by Graciela Theora LABOR, RN Outcome: Progressing

## 2024-08-14 DIAGNOSIS — F819 Developmental disorder of scholastic skills, unspecified: Secondary | ICD-10-CM | POA: Diagnosis not present

## 2024-08-14 DIAGNOSIS — A419 Sepsis, unspecified organism: Secondary | ICD-10-CM | POA: Diagnosis not present

## 2024-08-14 DIAGNOSIS — I358 Other nonrheumatic aortic valve disorders: Secondary | ICD-10-CM | POA: Diagnosis not present

## 2024-08-14 DIAGNOSIS — R233 Spontaneous ecchymoses: Secondary | ICD-10-CM | POA: Diagnosis not present

## 2024-08-14 LAB — CBC
HCT: 30.3 % — ABNORMAL LOW (ref 36.0–46.0)
Hemoglobin: 9.8 g/dL — ABNORMAL LOW (ref 12.0–15.0)
MCH: 25.5 pg — ABNORMAL LOW (ref 26.0–34.0)
MCHC: 32.3 g/dL (ref 30.0–36.0)
MCV: 78.7 fL — ABNORMAL LOW (ref 80.0–100.0)
Platelets: 382 K/uL (ref 150–400)
RBC: 3.85 MIL/uL — ABNORMAL LOW (ref 3.87–5.11)
RDW: 19.5 % — ABNORMAL HIGH (ref 11.5–15.5)
WBC: 15.2 K/uL — ABNORMAL HIGH (ref 4.0–10.5)
nRBC: 1.4 % — ABNORMAL HIGH (ref 0.0–0.2)

## 2024-08-14 LAB — HEPATITIS B SURFACE ANTIGEN: Hepatitis B Surface Ag: NONREACTIVE

## 2024-08-14 LAB — COMPREHENSIVE METABOLIC PANEL WITH GFR
ALT: 11 U/L (ref 0–44)
AST: 17 U/L (ref 15–41)
Albumin: 2.1 g/dL — ABNORMAL LOW (ref 3.5–5.0)
Alkaline Phosphatase: 46 U/L (ref 38–126)
Anion gap: 13 (ref 5–15)
BUN: 5 mg/dL — ABNORMAL LOW (ref 6–20)
CO2: 24 mmol/L (ref 22–32)
Calcium: 8.9 mg/dL (ref 8.9–10.3)
Chloride: 106 mmol/L (ref 98–111)
Creatinine, Ser: 0.32 mg/dL — ABNORMAL LOW (ref 0.44–1.00)
GFR, Estimated: >60 mL/min (ref >60)
Glucose, Bld: 114 mg/dL — ABNORMAL HIGH (ref 70–99)
Potassium: 3.9 mmol/L (ref 3.5–5.1)
Sodium: 143 mmol/L (ref 135–145)
Total Bilirubin: 0.6 mg/dL (ref 0.0–1.2)
Total Protein: 5.8 g/dL — ABNORMAL LOW (ref 6.5–8.1)

## 2024-08-14 NOTE — Consult Note (Addendum)
 [facility] Psychiatric Consult Initial  Patient Name: .Alexandria Harvey  MRN: [id number]  DOB: 05-05-1981  Consult Order details:  Orders (From admission, onward)     Start     Ordered   08/12/24 0813  IP CONSULT TO PSYCHIATRY       Comments: Needs alternates to clozaril  and help with medication management thank you  Ordering Provider: Samtani, Jai-Gurmukh, MD  Provider:  (Not yet assigned)  Question Answer Comment  Location [facility]   Reason for Consult? Consult request      08/12/24 0813             Mode of Visit: In person    Psychiatry Consult Evaluation  Service Date: August 14, 2024 LOS:  LOS: 2 days  Chief Complaint I guess I'm ok, I don't know  Primary Psychiatric Diagnoses  Schizoaffective disorder, depressive type IDD   Assessment  Alexandria Harvey is a 43 y.o. female admitted: Medicallyfor 08/11/2024 11:43 PM per Dr. Dena with medical history significant of with history of diabetes, GERD, schizoaffective disorder, hypertension, hyperlipidemia, disability who to the emergency department due to a rash patient was recently discharged from the hospital on 10/8. She had a boot on an injured leg and tripped on 9/16 and broke her neck with a neck brace in place. She was admitted for AMS and a rash, Vancomycin  was in place during rehab for sepsis.  Her current presentation of seeing and hearing things with past documentation meeting the diagnosis of schizoaffective d/o.  Current outpatient psychotropic medications include Clozaril , Cogentin , Depakote , Buspar , Ativan , gabapentin  and historically she has had a positive response to these medications. She was compliant with medications prior to admission as evidenced by facility notes. On initial examination, patient was able to communicate a few things verbally prior to drifting off to sleep. Please see plan below for detailed recommendations.   08/14/2024 Patient appears to be doing better this  morning per family reports. Dr. Akintayo is amenable to the medication changes that were made and requested that the patient schedule a follow up at discharge and have her discharge summary sent to his office.   Diagnoses:  Active Hospital problems: Principal Problem:   Petechial rash Active Problems:   Sepsis (HCC)   Aortic valve mass   Multiple fractures of thoracic spine, closed (HCC)   Closed fracture of multiple cervical vertebrae (HCC)   Drug rash   Cognitive developmental delay   Lactic acid acidosis   Schizoaffective disorder (HCC)    Plan   ## Psychiatric Medication Recommendations:  Continue Clozaril  50 mg BID, Clozaril  level ordered on 12/6 Awaiting level Depakote  250 mg TID, valproic  acid level ordered-12/6--41 level result Gabapentin  300 mg TID decreased to 100 mg TID per MD Cogentin  1 mg BID on hold Buspar  5 mg TID on hold  ## Medical Decision Making Capacity: Patient has a guardian and has thus been adjudicated incompetent; please involve patients guardian in medical decision making  ## Further Work-up:  -- most recent EKG on 06/11/24 had QtC of 457 -- Pertinent labwork reviewed earlier this admission includes: CBC, chem panel, EKG, U/A   ## Disposition:-- There are no psychiatric contraindications to discharge at this time Please have social work assist with scheduling follow up appointment with Dr. Sable the patient's outpatient psychiatrist.  ## Behavioral / Environmental: -Delirium Precautions: Delirium Interventions for Nursing and Staff: - RN to open blinds every AM. - To Bedside: Glasses, hearing aide, and pt's own shoes. Make  available to patients. when possible and encourage use. - Encourage po fluids when appropriate, keep fluids within reach. - OOB to chair with meals. - Passive ROM exercises to all extremities with AM & PM care. - RN to assess orientation to person, time and place QAM and PRN. - Recommend extended visitation hours with familiar  family/friends as feasible. - Staff to minimize disturbances at night. Turn off television when pt asleep or when not in use.    ## Safety and Observation Level:  - Based on my clinical evaluation, I estimate the patient to be at minimal risk of self harm in the current setting. - At this time, we recommend  routine. This decision is based on my review of the chart including patient's history and current presentation, interview of the patient, mental status examination, and consideration of suicide risk including evaluating suicidal ideation, plan, intent, suicidal or self-harm behaviors, risk factors, and protective factors. This judgment is based on our ability to directly address suicide risk, implement suicide prevention strategies, and develop a safety plan while the patient is in the clinical setting. Please contact our team if there is a concern that risk level has changed.  CSSR Risk Category:C-SSRS RISK CATEGORY: No Risk  Suicide Risk Assessment: Patient has following modifiable risk factors for suicide: none Patient has following non-modifiable or demographic risk factors for suicide: psychiatric hospitalization Patient has the following protective factors against suicide: Access to outpatient mental health care  Thank you for this consult request. Recommendations have been communicated to the primary team.  We will sign off at this time.   Alexandria LITTIE Glatter, DO       History of Present Illness  Relevant Aspects of [facility] Course:  Admitted on 08/11/2024 for AMS and a rash. They broke their neck on 05/23/2024 due to tripping because of the boot on her injured leg and moved to rehab after hospitalization versus her group home.  Patient does have documented IDD and schizoaffective d/o. She was admitted for AMS and a rash, Vancomycin  was in place during rehab for sepsis.  Patient Report:  08/13/2024 43 yo female was lying calmly in her hospital bed with neck/chest brace in  place for her cervical fracture, history of schizoaffective d/o. When asked how she was doing, she stated, I guess ok, I don't know.  Then she answered I guess to the next question and drifted back to sleep.  This provider and Dr. Larina adjusted her medications yesterday to assist with AMS.  Unfortunately, the lab was unable to obtain a blood level today.  Her valproic  acid level was 41 yesterday.  08/14/2024 Patient seen laying in bed this morning on my approach accompanied by her brother at bedside. She was able to answer her name and state that she is doing fine. She was unable to answer why she was in the hospital. Per her brother at bedside she is doing much better than when she first came to the hospital. She isn't lethargic and she is communicating like her normal self.   Per [facility] patient last received Clozaril  from Dr. Musa Akintayo [phone] Per Dr. Sable the patient was last seen in August 2025 and was continued on Clozaril  100 mg PO BID. He reports that she was supposed to follow up in September but she cancelled the appointment. He is fine with the medication adjustment and instructed the treatment team to have the patient schedule a follow up appointment at discharge and provide her discharge summary.  Psych ROS:  Depression: UTA Anxiety:  UTA Mania (lifetime and current): UTA Psychosis: (lifetime and current): past  Review of Systems  Psychiatric/Behavioral:  Positive for hallucinations.      Psychiatric and Social History  Psychiatric History:  Information collected from patient and chart.  Prev Dx/Sx: schizoaffective d/o Current Psych Provider: Dr [name] Donath Home Meds (current): Clozaril  Previous Med Trials: Zyprexa  Prior Psych Hospitalization: [facility] in 2016 Prior Self Harm: past history Prior Violence: none  Social History:  Developmental Hx: IDD per chart Living Situation: [facility] rehab, prior to rehab she was in a group  home  Access to weapons/lethal means: none   Substance History None noted in chart  Exam Findings  Physical Exam: completed by MD, reviewed. Vital Signs:  Temp:  [98 F (36.7 C)-99.7 F (37.6 C)] 99.3 F (37.4 C) (12/08 0445) Pulse Rate:  [90-103] 103 (12/08 0849) Resp:  [18] 18 (12/08 0849) BP: (109-138)/(69-95) 138/95 (12/08 0849) SpO2:  [96 %-97 %] 96 % (12/08 0849) Blood pressure (!) 138/95, pulse (!) 103, temperature 99.3 F (37.4 C), temperature source Axillary, resp. rate 18, height 5' 2 (1.575 m), weight 70.2 kg, SpO2 96%. Body mass index is 28.31 kg/m.  Physical Exam  Mental Status Exam: General Appearance: Casual  Orientation:  Other:  person and place  Memory:  Immediate;   Fair  Concentration:  Concentration: Poor and Attention Span: Poor  Recall:  Fair to poor  Attention  Poor  Eye Contact:  Minimal  Speech:  Clear and Coherent  Language:  Fair  Volume:  Decreased  Mood: I guess ok, I don't know  Affect:  Blunt  Thought Process:  UTA  Thought Content:  Hallucinations: Auditory Visual  Suicidal Thoughts:  UTA  Homicidal Thoughts:  UTA  Judgement:  Impaired  Insight:  fair  Psychomotor Activity:  Decreased  Akathisia:  No  Fund of Knowledge:  IDD per chart      Assets:  Housing  Cognition:  Impaired,  Moderate  ADL's:  Impaired  AIMS (if indicated):        Other History   These have been pulled in through the EMR, reviewed, and updated if appropriate.  Family History:  The patient's family history includes Cancer in her father; Diabetes Mellitus II in her mother.  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   Excessive anger    GERD (gastroesophageal reflux disease)    Hallucinations    Hyperlipidemia    Hypertension    Mild mental retardation    Schizophrenia (HCC)    Seizures (HCC)    Vitamin D  deficiency     Surgical History: Past  Surgical History:  Procedure Laterality Date   NO PAST SURGERIES       Medications:   Current Facility-Administered Medications:    acetaminophen  (TYLENOL ) tablet 650 mg, 650 mg, Oral, Q6H PRN, 650 mg at 08/13/24 2050 **OR** acetaminophen  (TYLENOL ) suppository 650 mg, 650 mg, Rectal, Q6H PRN, Dorrell, Robert, MD   apixaban  (ELIQUIS ) tablet 5 mg, 5 mg, Oral, BID, Dorrell, Robert, MD, 5 mg at 08/14/24 0907   cloZAPine  (CLOZARIL ) tablet 50 mg, 50 mg, Oral, BID, Alexandria Sharlot GRADE, NP, 50 mg at 08/14/24 9093   collagenase  (SANTYL ) ointment, , Topical, Daily, Samtani, Jai-Gurmukh, MD, Given at 08/14/24 0908   diphenhydrAMINE -zinc  acetate (BENADRYL ) 2-0.1 % cream, , Topical, BID, Samtani, Jai-Gurmukh, MD, Given at 08/14/24 [phone]  divalproex  (DEPAKOTE ) DR tablet 250 mg, 250 mg, Oral, TID, Samtani, Jai-Gurmukh, MD, 250 mg at 08/14/24 9093   gabapentin  (NEURONTIN ) capsule 100 mg, 100 mg, Oral, TID, Samtani, Jai-Gurmukh, MD, 100 mg at 08/14/24 0908   ondansetron  (ZOFRAN ) tablet 4 mg, 4 mg, Oral, Q6H PRN **OR** ondansetron  (ZOFRAN ) injection 4 mg, 4 mg, Intravenous, Q6H PRN, Dorrell, Robert, MD   oxyCODONE  (Oxy IR/ROXICODONE ) immediate release tablet 2.5 mg, 2.5 mg, Oral, Q4H PRN, Samtani, Jai-Gurmukh, MD, 2.5 mg at 08/13/24 0045   polyethylene glycol (MIRALAX  / GLYCOLAX ) packet 17 g, 17 g, Oral, BID, Samtani, Jai-Gurmukh, MD, 17 g at 08/14/24 0908   senna-docusate (Senokot-S) tablet 1 tablet, 1 tablet, Oral, Daily, Samtani, Jai-Gurmukh, MD, 1 tablet at 08/14/24 0907  Allergies: Allergies  Allergen Reactions   Haloperidol Lactate Other (See Comments)    Unknown reaction   Shrimp [Shellfish Allergy] Hives and Rash    All Shellfish per [facility]   Strawberry (Diagnostic) Rash    Alexandria LITTIE Glatter, DO

## 2024-08-14 NOTE — Progress Notes (Signed)
 Progress Note   Patient: Alexandria Harvey FMW:[id number] DOB: [birth year] DOA: 08/11/2024  DOS: the patient was seen and examined on 08/14/2024   Brief hospital course:  43 year old female- Under guardianship of care for DSS-phone #[phone]--- apparently also has a caregiver [name]/[name] who is the group home, DM, mental retardation, Schizoaffective disorder, previous hallucination, sacral decubitus ulcer, cervical fracture with collar - p/w fever and encephalopathy.   Assessment and Plan:   Concern for sepsis - Encephalopathy, leukocytosis, lactic acidosis, given concern for sepsis.  Etiology unclear possible sacral decubitus ulcer versus PICC line infection.  Blood cultures, IV fluid bolus, empiric antibiotics initiated at presentation.  Monitor blood pressure closely.  Evaluated by infectious disease.  Antibiotics since stopped.  Currently on line holiday.  Repeating blood cultures today.   Acute metabolic encephalopathy - Multifactorial etiology but appears to be improving.  Polypharmacy, dehydration, possible sepsis.  Medications weaned back.  Appears to be showing improvement.  Patient alert, communicative.  Resides at group home.  Monitor closely.   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  Evaluated by infectious disease.  Only minimal concern for infectious etiology, other differentials include Libman-Sacks.  Pending autoimmune workup.  Follow-up blood cultures closely.   Acute rash - Etiology unclear but thought to be possible drug reaction versus excoriations from bedbugs.  Antibiotics stopped.  Contact precautions.  Will continue monitor closely.  Appears to be improving.   Diabetes mellitus - Insulin  sliding scale on board.   History of cervical fractures with polytrauma - Remains in c-collar.  Follow-up with Dr. Nundkumar-minimize meds as above because of mild encephalopathy.   Acute DVT BL LE - Lower extremity Dopplers noting BL proximal femoral vein  DVTs.  Continue Eliquis .  Unclear if provoked or unprovoked.  Pending hypercoagulable workup.   Severe constipation - Noted incidentally on CT scan.  Bowel regimen on board.  No abdominal pain noted.   Cognitive deficits/schizophrenia - Resides at a group home.  guardianship of care for DSS-phone #[phone]--- apparently also has a caregiver [name]/[name] who is the group home.  Patient evaluated by psychiatry.  Medication recommendations per psych.  Goals of care - Awaiting definitive track of care, infectious versus autoimmune.  Likely patient to return to group home afterwards.  TOC following closely.   Subjective: Patient resting comfortably this morning.  Family at bedside helping with eating.  No acute events overnight, afebrile.  Denies any fever, nausea, vomiting, pain.  Physical Exam:  Vitals:   08/13/24 2327 08/14/24 0445 08/14/24 0849 08/14/24 1202  BP: 124/78 109/69 (!) 138/95 119/78  Pulse: 92 95 (!) 103 95  Resp: 18 18 18 20   Temp: 99.7 F (37.6 C) 99.3 F (37.4 C)  98.2 F (36.8 C)  TempSrc: Oral Axillary  Oral  SpO2: 97% 97% 96% 96%  Weight:      Height:        GENERAL:  Alert, pleasant, no acute distress  HEENT:  EOMI, c-collar CARDIOVASCULAR:  RRR, no murmurs appreciated RESPIRATORY:  Clear to auscultation, no wheezing, rales, or rhonchi GASTROINTESTINAL:  Soft, nontender, nondistended EXTREMITIES:  No LE edema bilaterally NEURO:  No new focal deficits appreciated SKIN:  No rashes noted PSYCH:  Appropriate mood and affect    Data Reviewed:  Imaging Studies: VAS US  UPPER EXTREMITY VENOUS DUPLEX Result Date: 08/12/2024 UPPER VENOUS STUDY  Patient Name:  Alexandria Harvey  Date of Exam:   08/12/2024 Medical Rec #: [id number]  Accession #:    [id number] Date of Birth: 08-Jun-1981        Patient Gender: F Patient Age:   68 years Exam Location:  [facility] [place] Procedure:      VAS US  UPPER EXTREMITY VENOUS DUPLEX Referring Phys: [facility] SAMTANI  --------------------------------------------------------------------------------  Indications: Edema, and recent fall. Limitations: Musculoskeletal features and patient contorted and C-collar. Comparison Study: No prior exam. Performing Technologist: Edilia Elden Appl  Examination Guidelines: A complete evaluation includes B-mode imaging, spectral Doppler, color Doppler, and power Doppler as needed of all accessible portions of each vessel. Bilateral testing is considered an integral part of a complete examination. Limited examinations for reoccurring indications may be performed as noted.  Right Findings: +----------+------------+---------+-----------+----------+--------------+ RIGHT     CompressiblePhasicitySpontaneousProperties   Summary     +----------+------------+---------+-----------+----------+--------------+ IJV                                                 Not visualized +----------+------------+---------+-----------+----------+--------------+ Subclavian               Yes       Yes                             +----------+------------+---------+-----------+----------+--------------+ Axillary      Full       Yes       Yes                             +----------+------------+---------+-----------+----------+--------------+ Brachial      Full       Yes       Yes                             +----------+------------+---------+-----------+----------+--------------+ Radial        Full                                                 +----------+------------+---------+-----------+----------+--------------+ Ulnar         Full                                                 +----------+------------+---------+-----------+----------+--------------+ Cephalic      Full       Yes       Yes                             +----------+------------+---------+-----------+----------+--------------+ Basilic       Full       Yes       Yes                              +----------+------------+---------+-----------+----------+--------------+ Right internal jugular vein not visualized due to C-collar. Limited examination due to patient being contorted and not being able to properly position. Best obtainable images.  Left Findings: +----------+------------+---------+-----------+----------+--------------+ LEFT      CompressiblePhasicitySpontaneousProperties  Summary     +----------+------------+---------+-----------+----------+--------------+ IJV                                                 Not visualized +----------+------------+---------+-----------+----------+--------------+ Subclavian               Yes       Yes                             +----------+------------+---------+-----------+----------+--------------+ Axillary      Full       Yes       Yes                             +----------+------------+---------+-----------+----------+--------------+ Brachial      Full                                                 +----------+------------+---------+-----------+----------+--------------+ Radial        Full                                                 +----------+------------+---------+-----------+----------+--------------+ Ulnar                                               Not visualized +----------+------------+---------+-----------+----------+--------------+ Cephalic      Full       Yes       Yes                             +----------+------------+---------+-----------+----------+--------------+ Basilic       Full       Yes       Yes                             +----------+------------+---------+-----------+----------+--------------+ Left internal jugular vein not visualized due to C-collar and left ulnar vein unaccessible. Limited examination due to patient being contorted and not being able to properly position. Best obtainable images.  Summary:  Right: No evidence of deep vein thrombosis in the upper  extremity. No evidence of superficial vein thrombosis in the upper extremity.  Left: No evidence of deep vein thrombosis in the upper extremity. No evidence of superficial vein thrombosis in the upper extremity.  *See table(s) above for measurements and observations.  Diagnosing physician: Debby Robertson Electronically signed by Debby Robertson on 08/12/2024 at 8:12:40 PM.    Final    VAS US  LOWER EXTREMITY VENOUS (DVT) Result Date: 08/12/2024  Lower Venous DVT Study Patient Name:  Alexandria Harvey  Date of Exam:   08/12/2024 Medical Rec #: [id number]         Accession #:    [id number] Date of Birth: 1981-01-09        Patient Gender: F Patient Age:   6 years Exam Location:  [facility] Procedure:      VAS US  LOWER EXTREMITY VENOUS (DVT) Referring Phys: [facility] SAMTANI --------------------------------------------------------------------------------  Indications: Swelling, Edema, and recent fall.  Comparison Study: No prior exam. Performing Technologist: Edilia Elden Appl  Examination Guidelines: A complete evaluation includes B-mode imaging, spectral Doppler, color Doppler, and power Doppler as needed of all accessible portions of each vessel. Bilateral testing is considered an integral part of a complete examination. Limited examinations for reoccurring indications may be performed as noted. The reflux portion of the exam is performed with the patient in reverse Trendelenburg.  +---------+---------------+---------+-----------+----------+-------------------+ RIGHT    CompressibilityPhasicitySpontaneityPropertiesThrombus Aging      +---------+---------------+---------+-----------+----------+-------------------+ CFV      Partial        Yes      Yes                                      +---------+---------------+---------+-----------+----------+-------------------+ SFJ      Full           Yes      Yes                                       +---------+---------------+---------+-----------+----------+-------------------+ FV Prox  Partial                                                          +---------+---------------+---------+-----------+----------+-------------------+ FV Mid   None           Yes      Yes                  Not well visualized +---------+---------------+---------+-----------+----------+-------------------+ FV DistalFull           Yes      Yes                                      +---------+---------------+---------+-----------+----------+-------------------+ PFV      Partial        Yes      Yes                                      +---------+---------------+---------+-----------+----------+-------------------+ POP      Full           Yes      Yes                                      +---------+---------------+---------+-----------+----------+-------------------+ PTV                                                   Not well  visualized, patent                                                        by color.           +---------+---------------+---------+-----------+----------+-------------------+ PERO                                                  Not well                                                                  visualized, patent                                                        by color.           +---------+---------------+---------+-----------+----------+-------------------+ EIV                     Yes      Yes                                      +---------+---------------+---------+-----------+----------+-------------------+ Deep vein thrombosis noted in the right common femoral the proximal and middle femoral veins and the profunda vein.  +---------+---------------+---------+-----------+----------+-------------------+ LEFT      CompressibilityPhasicitySpontaneityPropertiesThrombus Aging      +---------+---------------+---------+-----------+----------+-------------------+ CFV      Partial        Yes      Yes                                      +---------+---------------+---------+-----------+----------+-------------------+ SFJ      Full           Yes      Yes                                      +---------+---------------+---------+-----------+----------+-------------------+ FV Prox  None           No       No                                       +---------+---------------+---------+-----------+----------+-------------------+ FV Mid   None           No       No                                       +---------+---------------+---------+-----------+----------+-------------------+  FV DistalFull           Yes      Yes                                      +---------+---------------+---------+-----------+----------+-------------------+ PFV      Partial        Yes      Yes                                      +---------+---------------+---------+-----------+----------+-------------------+ POP      Full           Yes      Yes                                      +---------+---------------+---------+-----------+----------+-------------------+ PTV                                                   Not well                                                                  visualized, patent                                                        by color.           +---------+---------------+---------+-----------+----------+-------------------+ PERO                                                  Not well                                                                  visualized, patent                                                        by color.           +---------+---------------+---------+-----------+----------+-------------------+ EIV                      Yes      Yes                                      +---------+---------------+---------+-----------+----------+-------------------+  Deep vein thrombosis noted in the left common femoral the proximal and middle femoral veins and the profunda vein.    Summary: RIGHT: - Findings consistent with acute deep vein thrombosis involving the right common femoral vein, right femoral vein, and right proximal profunda vein.  - No cystic structure found in the popliteal fossa.  LEFT: - Findings consistent with acute deep vein thrombosis involving the left common femoral vein, left femoral vein, and left proximal profunda vein.  - No cystic structure found in the popliteal fossa.  *See table(s) above for measurements and observations. Electronically signed by Debby Robertson on 08/12/2024 at 8:12:15 PM.    Final    ECHOCARDIOGRAM COMPLETE Result Date: 08/12/2024    ECHOCARDIOGRAM REPORT   Patient Name:   Alexandria Harvey Date of Exam: 08/12/2024 Medical Rec #:  [id number]        Height:       62.0 in Accession #:    [id number]       Weight:       154.8 lb Date of Birth:  06/21/1981       BSA:          1.714 m Patient Age:    43 years         BP:           117/77 mmHg Patient Gender: F                HR:           78 bpm. Exam Location:  Inpatient Procedure: 2D Echo, Color Doppler, Cardiac Doppler and Intracardiac            Opacification Agent (Both Spectral and Color Flow Doppler were            utilized during procedure). STAT ECHO Indications:    Endocarditis  History:        Patient has no prior history of Echocardiogram examinations.                 Risk Factors:Hypertension, Diabetes and Dyslipidemia. GERD.  Sonographer:    Logan Shove RDCS Referring Phys: [phone] [facility]  Sonographer Comments: Suboptimal parasternal window. Body Position. IMPRESSIONS  1. Left ventricular ejection fraction, by estimation, is 60 to 65%. The left ventricle has normal function. The left ventricle has no regional wall motion  abnormalities. Left ventricular diastolic parameters were normal.  2. Right ventricular systolic function is normal. The right ventricular size is normal.  3. The mitral valve is normal in structure. Trivial mitral valve regurgitation. No evidence of mitral stenosis.  4. Rounded echodensity (6 mm X 8 mm) on the ventricular side of (predominantly) the right coronoary cusp leaflet. No abscess is appreciated. The aortic valve is abnormal. Aortic valve regurgitation is not visualized. Comparison(s): No prior Echocardiogram. Conclusion(s)/Recommendation(s): In the clinical picture of bactermia, the study could be consistent with infective endocarditis. Consider TEE if clinically indicated and within goals of care. FINDINGS  Left Ventricle: Left ventricular ejection fraction, by estimation, is 60 to 65%. The left ventricle has normal function. The left ventricle has no regional wall motion abnormalities. Definity  contrast agent was given IV to delineate the left ventricular  endocardial borders. The left ventricular internal cavity size was normal in size. There is no left ventricular hypertrophy. Left ventricular diastolic parameters were normal. Right Ventricle: The right ventricular size is normal. No increase in right ventricular wall thickness. Right ventricular systolic function is normal. Left Atrium: Left atrial size was normal  in size. Right Atrium: Right atrial size was normal in size. Pericardium: There is no evidence of pericardial effusion. Mitral Valve: The mitral valve is normal in structure. Trivial mitral valve regurgitation. No evidence of mitral valve stenosis. Tricuspid Valve: The tricuspid valve is normal in structure. Tricuspid valve regurgitation is not demonstrated. No evidence of tricuspid stenosis. Aortic Valve: Rounded echodensity (6 mm X 8 mm) on the ventricular side of (predominantly) the right coronoary cusp leaflet. No abscess is appreciated. The aortic valve is abnormal. Aortic valve  regurgitation is not visualized. Aortic valve peak gradient  measures 10.5 mmHg. Pulmonic Valve: The pulmonic valve was normal in structure. Pulmonic valve regurgitation is trivial. No evidence of pulmonic stenosis. Aorta: The aortic root and ascending aorta are structurally normal, with no evidence of dilitation. IAS/Shunts: No atrial level shunt detected by color flow Doppler.  LEFT VENTRICLE PLAX 2D LVIDd:         4.70 cm      Diastology LVIDs:         3.10 cm      LV e' medial:    9.14 cm/s LV PW:         0.70 cm      LV E/e' medial:  9.6 LV IVS:        0.70 cm      LV e' lateral:   15.60 cm/s LVOT diam:     2.00 cm      LV E/e' lateral: 5.6 LVOT Area:     3.14 cm  LV Volumes (MOD) LV vol d, MOD A2C: 81.3 ml LV vol d, MOD A4C: 107.0 ml LV vol s, MOD A2C: 33.3 ml LV vol s, MOD A4C: 40.1 ml LV SV MOD A2C:     48.0 ml LV SV MOD A4C:     107.0 ml LV SV MOD BP:      55.8 ml RIGHT VENTRICLE             IVC RV Basal diam:  3.20 cm     IVC diam: 1.40 cm RV S prime:     14.10 cm/s TAPSE (M-mode): 1.8 cm      PULMONARY VEINS                             Diastolic Velocity: 31.20 cm/s                             S/D Velocity:       1.90                             Systolic Velocity:  59.70 cm/s LEFT ATRIUM             Index        RIGHT ATRIUM           Index LA diam:        3.00 cm 1.75 cm/m   RA Area:     10.40 cm LA Vol (A2C):   51.8 ml 30.22 ml/m  RA Volume:   20.10 ml  11.72 ml/m LA Vol (A4C):   31.9 ml 18.61 ml/m LA Biplane Vol: 40.6 ml 23.68 ml/m  AORTIC VALVE AV Area (Vmax): 2.27 cm AV Vmax:        162.00 cm/s AV Peak Grad:   10.5 mmHg LVOT Vmax:  117.00 cm/s  AORTA Ao Root diam: 2.00 cm Ao Asc diam:  2.70 cm MITRAL VALVE MV Area (PHT): 4.21 cm    SHUNTS MV Decel Time: 180 msec    Systemic Diam: 2.00 cm MV E velocity: 87.40 cm/s MV A velocity: 80.30 cm/s MV E/A ratio:  1.09 Stanly Leavens MD Electronically signed by Stanly Leavens MD Signature Date/Time: 08/12/2024/10:18:45 AM    Final     DG Chest Portable 1 View Result Date: 08/12/2024 EXAM: 1 VIEW(S) XRAY OF THE CHEST 08/12/2024 05:55:00 AM COMPARISON: 06/11/2024 CLINICAL HISTORY: sepsis FINDINGS: LINES, TUBES AND DEVICES: Left PICC line in place. LUNGS AND PLEURA: Hypoinflated lungs. Asymmetric elevation of the right hemidiaphragm, unchanged. No pleural effusion. No pneumothorax. HEART AND MEDIASTINUM: No acute abnormality of the cardiac and mediastinal silhouettes. BONES AND SOFT TISSUES: Degenerative changes are identified at the glenohumeral joint. 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 IMPRESSION: 1. Hypoinflated lungs with asymmetric elevation of the right hemidiaphragm, unchanged. 2. Left PICC line in appropriate position. Electronically signed by: Waddell Calk MD 08/12/2024 06:13 AM EST RP Workstation: [id number]   CT ABDOMEN PELVIS W CONTRAST Result Date: 08/12/2024 EXAM: CT ABDOMEN AND PELVIS WITH CONTRAST 08/12/2024 04:11:48 AM TECHNIQUE: CT of the abdomen and pelvis was performed with the administration of 75 mL of iohexol  (OMNIPAQUE ) 350 MG/ML injection. Multiplanar reformatted images are provided for review. Automated exposure control, iterative reconstruction, and/or weight-based adjustment of the mA/kV was utilized to reduce the radiation dose to as low as reasonably achievable. COMPARISON: Findings are compared to 05/23/2024. CLINICAL HISTORY: Sepsis; sacral wound. FINDINGS: LOWER CHEST: Central venous catheter seen at the superior cavoatrial junction. Moderate multilevel coronary artery calcifications. LIVER: The liver is unremarkable. GALLBLADDER AND BILE DUCTS: Gallbladder is unremarkable. No biliary ductal dilatation. SPLEEN: No acute abnormality. PANCREAS: No acute abnormality. ADRENAL GLANDS: No acute abnormality. KIDNEYS, URETERS AND BLADDER: No stones in the kidneys or ureters. No hydronephrosis. No perinephric or periureteral stranding. Urinary bladder  is unremarkable. GI AND BOWEL: Large stool burden without evidence of obstruction. The rectosigmoid colon, however, is relatively decompressed. Appendix normal. The stomach, small bowel, and large bowel are otherwise unremarkable. PERITONEUM AND RETROPERITONEUM: No ascites. No free air. VASCULATURE: Mild aortoiliac atherosclerotic calcification. No aortic aneurysm. LYMPH NODES: No lymphadenopathy. REPRODUCTIVE ORGANS: No acute abnormality. BONES AND SOFT TISSUES: Since the prior examination, a sacral decubitus wound has developed, which approaches the cortex of the subjacent sacrum (75/3) in keeping with a grade 3-4 sacral decubitus wound. No osseous erosion to indicate superimposed osteomyelitis. 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 No acute bone abnormality. IMPRESSION: 1. 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 2. Large stool burden without evidence of obstruction, with relatively decompressed rectosigmoid colon. Electronically signed by: Dorethia Molt MD 08/12/2024 04:43 AM EST RP Workstation: [id number]    Results are pending, will review when available.  Previous records (including but not limited to H&P, progress notes, nursing notes, TOC management) were reviewed in assessment of this patient.  Labs: CBC: Recent Labs  Lab 08/12/24 0141 08/13/24 1116 08/14/24 0433  WBC 14.3* 14.4* 15.2*  NEUTROABS 6.1  --   --   HGB 9.6* 9.6* 9.8*  HCT 30.3* 30.5* 30.3*  MCV 80.4 80.5 78.7*  PLT 278 341 382   Basic Metabolic Panel: Recent Labs  Lab 08/12/24 0141 08/13/24 1116 08/14/24 0433  NA 137 141 143  K 3.6 4.1 3.9  CL 100 105 106  CO2 22 23 24   GLUCOSE 129* 176* 114*  BUN 10 5* 5*  CREATININE 0.46 0.39* 0.32*  CALCIUM  8.7* 8.4* 8.9   Liver Function Tests: Recent Labs  Lab 08/12/24 0141 08/14/24 0433  AST 24 17  ALT 15 11  ALKPHOS 47 46   BILITOT <0.2 0.6  PROT 5.5* 5.8*  ALBUMIN 2.0* 2.1*   CBG: No results for input(s): GLUCAP in the last 168 hours.  Scheduled Meds:  apixaban   5 mg Oral BID   cloZAPine   50 mg Oral BID   collagenase    Topical Daily   diphenhydrAMINE -zinc  acetate   Topical BID   divalproex   250 mg Oral TID   gabapentin   100 mg Oral TID   polyethylene glycol  17 g Oral BID   senna-docusate  1 tablet Oral Daily   Continuous Infusions: PRN Meds:.acetaminophen  **OR** acetaminophen , ondansetron  **OR** ondansetron  (ZOFRAN ) IV, oxyCODONE   Family Communication: At bedside  Disposition: Status is: Inpatient Remains inpatient appropriate because: Sepsis workup     Time spent: 36 minutes  Length of inpatient stay: 2 days  Author: Carliss LELON Canales, DO 08/14/2024 12:42 PM  For on call review [email or web address].

## 2024-08-14 NOTE — Plan of Care (Signed)

## 2024-08-15 ENCOUNTER — Encounter (HOSPITAL_COMMUNITY): Payer: Self-pay | Admitting: Internal Medicine

## 2024-08-15 DIAGNOSIS — I358 Other nonrheumatic aortic valve disorders: Secondary | ICD-10-CM | POA: Diagnosis not present

## 2024-08-15 DIAGNOSIS — S129XXD Fracture of neck, unspecified, subsequent encounter: Secondary | ICD-10-CM | POA: Diagnosis not present

## 2024-08-15 LAB — CBC
HCT: 31.4 % — ABNORMAL LOW (ref 36.0–46.0)
Hemoglobin: 10.2 g/dL — ABNORMAL LOW (ref 12.0–15.0)
MCH: 26 pg (ref 26.0–34.0)
MCHC: 32.5 g/dL (ref 30.0–36.0)
MCV: 80.1 fL (ref 80.0–100.0)
Platelets: 431 K/uL — ABNORMAL HIGH (ref 150–400)
RBC: 3.92 MIL/uL (ref 3.87–5.11)
RDW: 19.8 % — ABNORMAL HIGH (ref 11.5–15.5)
WBC: 14.1 K/uL — ABNORMAL HIGH (ref 4.0–10.5)
nRBC: 0.9 % — ABNORMAL HIGH (ref 0.0–0.2)

## 2024-08-15 LAB — ANA W/REFLEX IF POSITIVE: Anti Nuclear Antibody (ANA): NEGATIVE

## 2024-08-15 LAB — BASIC METABOLIC PANEL WITH GFR
Anion gap: 17 — ABNORMAL HIGH (ref 5–15)
BUN: 6 mg/dL (ref 6–20)
CO2: 17 mmol/L — ABNORMAL LOW (ref 22–32)
Calcium: 8.9 mg/dL (ref 8.9–10.3)
Chloride: 104 mmol/L (ref 98–111)
Creatinine, Ser: 0.44 mg/dL (ref 0.44–1.00)
GFR, Estimated: >60 mL/min (ref >60)
Glucose, Bld: 130 mg/dL — ABNORMAL HIGH (ref 70–99)
Potassium: 4.4 mmol/L (ref 3.5–5.1)
Sodium: 138 mmol/L (ref 135–145)

## 2024-08-15 LAB — BETA-2-GLYCOPROTEIN I ABS, IGG/M/A
Beta-2 Glyco I IgG: <9 GPI IgG units (ref 0–20)
Beta-2-Glycoprotein I IgA: <9 GPI IgA units (ref 0–25)
Beta-2-Glycoprotein I IgM: <9 GPI IgM units (ref 0–32)

## 2024-08-15 LAB — HCV AB W REFLEX TO QUANT PCR: HCV Ab: NONREACTIVE

## 2024-08-15 LAB — HEPATITIS B SURFACE ANTIBODY, QUANTITATIVE: Hep B S AB Quant (Post): 133 m[IU]/mL

## 2024-08-15 LAB — ANCA TITERS
Atypical P-ANCA titer: <1:20 {titer}
C-ANCA: <1:20 {titer}
P-ANCA: <1:20 {titer}

## 2024-08-15 LAB — HCV INTERPRETATION

## 2024-08-15 LAB — RHEUMATOID FACTOR: Rheumatoid fact SerPl-aCnc: <10 [IU]/mL (ref <14.0)

## 2024-08-15 NOTE — Plan of Care (Signed)

## 2024-08-15 NOTE — Consult Note (Addendum)
 Cardiology Consultation   Patient ID: Alexandria Harvey MRN: [id number]; DOB: March 09, 1981  Admit date: 08/11/2024 Date of Consult: 08/15/2024  PCP:  [facility]   [place] Providers Cardiologist:  None      Patient Profile: Alexandria Harvey is a 43 y.o. 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  She is currently admitted for concern for sepsis given her encephalopathy, leukocytosis, and lactic acidosis but ID is less concerned for infection and antibiotics have been discontinued. Also with concern for polypharmacy. 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 ID is not concerned for an infectious etiology given that her blood cultures have been negative. She was also found to have bilateral lower extremity DVTs this admission.  An echocardiogram was obtained for a bacteremia workup which showed normal EF of 60-65% with an aortic valve rounded echodensity (6 mm X 8 mm) on the ventricular side of (predominantly) the right coronoary cusp leaflet.  History of Present Illness: Alexandria Harvey was somewhat somnolent this afternoon. Briefly would open her eyes to voice but then would quickly fall back asleep. She denied chest pain, shortness of breath, palpitations, and syncope/presyncope.    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 Authorizing Provider  acetaminophen  (TYLENOL ) 500 MG tablet Take 2 tablets (1,000 mg total) by mouth every 8 (eight) hours as needed. 05/29/24  Yes Maczis, Michael M, PA-C  Amino Acids-Protein Hydrolys (FEEDING SUPPLEMENT, PRO-STAT SUGAR FREE 64,) LIQD Take 30 mLs by mouth in the morning and at bedtime.   Yes [provider]  apixaban  (ELIQUIS ) 5 MG TABS tablet Take 5 mg by mouth 2 (two) times daily. For 3 months   Yes [provider]  atenolol  (TENORMIN ) 50 MG tablet Take 1 tablet (50 mg total) by mouth daily. Patient taking differently: Take 75 mg by mouth daily. 01/25/15  Yes Ragena Odor, MD  atorvastatin  (LIPITOR ) 80 MG tablet Take 1 tablet (80 mg total) by mouth at bedtime. 01/25/15  Yes Ragena Odor, MD  benztropine  (COGENTIN ) 1 MG tablet Take 1 mg by mouth 2 (two) times daily.   Yes [provider]  busPIRone  (BUSPAR ) 5 MG tablet Take 5 mg by mouth 3 (three) times daily.   Yes [provider]  calcium -vitamin D  (OSCAL WITH D) 500-5 MG-MCG tablet Take 1 tablet by mouth 2 (two) times daily.   Yes [provider]  cloZAPine  (CLOZARIL ) 100 MG tablet Take 100 mg by mouth 2 (two) times daily.    Yes [provider]  divalproex  (DEPAKOTE ) 250 MG DR tablet Take 3 tablets (750 mg total) by mouth 3 (three) times daily. 06/14/24 08/12/25 Yes Amilibia, Jaden, DO  ezetimibe  (ZETIA ) 10 MG tablet Take 10 mg  by mouth daily. 05/09/24  Yes [provider]  gabapentin  (NEURONTIN ) 300 MG capsule Take 1 capsule (300 mg total) by mouth 3 (three) times daily. 05/29/24  Yes Maczis, Michael M, PA-C  lubiprostone  (AMITIZA ) 24 MCG capsule Take 1 capsule (24 mcg total) by mouth daily with breakfast. 05/30/24  Yes Maczis, Michael M, PA-C  methocarbamol  (ROBAXIN ) 500 MG tablet Take 2 tablets (1,000 mg total) by mouth every 8 (eight) hours as needed for muscle spasms. 05/29/24  Yes  Maczis, Michael M, PA-C  omeprazole (PRILOSEC) 20 MG capsule Take 20 mg by mouth in the morning and at bedtime.   Yes [provider]  polyethylene glycol (MIRALAX  / GLYCOLAX ) packet Take 17 g by mouth 2 (two) times daily. 06/29/17  Yes Armbruster, Elspeth SQUIBB, MD  predniSONE (DELTASONE) 20 MG tablet Take 20 mg by mouth daily with breakfast. For 5 days   Yes [provider]  sennosides-docusate sodium  (SENOKOT-S) 8.6-50 MG tablet Take 1 tablet by mouth daily.   Yes [provider]  simethicone  (MYLICON) 80 MG chewable tablet Chew 80 mg by mouth 2 (two) times daily.   Yes [provider]  Vitamin D , Ergocalciferol , (DRISDOL ) 1.25 MG (50000 UNIT) CAPS capsule Take 1 capsule (50,000 Units total) by mouth once a week. Patient taking differently: Take 50,000 Units by mouth once a week. Thursdays 06/03/24  Yes Maczis, Michael M, PA-C  zinc  oxide (BALMEX) 11.3 % CREA cream Apply 1 Application topically as needed.   Yes [provider]  collagenase  (SANTYL ) 250 UNIT/GM ointment Apply topically daily. Patient not taking: Reported on 08/12/2024 06/15/24   Amilibia, Jaden, DO  ferrous sulfate  325 (65 FE) MG tablet Take 1 tablet (325 mg total) by mouth daily with breakfast. Patient not taking: Reported on 08/12/2024 09/15/19 06/11/24  Krishnan, Gokul, MD  lisinopril  (PRINIVIL ,ZESTRIL ) 2.5 MG tablet Take 1 tablet (2.5 mg total) by mouth daily. Patient not taking: Reported on 06/11/2024 01/25/15   Ragena Odor, MD  naloxone  (NARCAN ) 0.4 MG/ML injection Inject 1 mL (0.4 mg total) into the vein as needed. Patient not taking: Reported on 06/11/2024 05/29/24   Maczis, Michael M, PA-C  oxyCODONE  (OXY IR/ROXICODONE ) 5 MG immediate release tablet Take 0.5-1 tablets (2.5-5 mg total) by mouth every 6 (six) hours as needed for breakthrough pain (2.5 mg for moderate, 5 mg for severe). Patient not taking: Reported on 08/12/2024 05/29/24   Maczis, Michael M, PA-C   piperacillin -tazobactam (ZOSYN ) 3.375 GM/50ML IVPB Inject 3.375 g into the vein every 8 (eight) hours. For 6 weeks Patient not taking: Reported on 08/12/2024    [provider]    Scheduled Meds:  apixaban   5 mg Oral BID   cloZAPine   50 mg Oral BID   collagenase    Topical Daily   diphenhydrAMINE -zinc  acetate   Topical BID   divalproex   250 mg Oral TID   gabapentin   100 mg Oral TID   polyethylene glycol  17 g Oral BID   senna-docusate  1 tablet Oral Daily   Continuous Infusions:  PRN Meds: acetaminophen  **OR** acetaminophen , ondansetron  **OR** ondansetron  (ZOFRAN ) IV, oxyCODONE   Allergies:    Allergies  Allergen Reactions   Haloperidol Lactate Other (See Comments)    Unknown reaction   Shrimp [Shellfish Allergy] Hives and Rash    All Shellfish per [facility]   Strawberry (Diagnostic) Rash    Social History:   Social History   Socioeconomic History   Marital status: Single    Spouse name: Not on file  Number of children: Not on file   Years of education: Not on file   Highest education level: Not on file  Occupational History   Not on file  Tobacco Use   Smoking status: Never   Smokeless tobacco: Never  Substance and Sexual Activity   Alcohol use: No   Drug use: No   Sexual activity: Never  Other Topics Concern   Not on file  Social History Narrative   Lives at [facility] [place] [place]   Cherri Stinton legal guardian     Right handed    Social Drivers of Health   Financial Resource Strain: High Risk (02/29/2024)   Received from [facility]   Overall Financial Resource Strain (CARDIA)    Difficulty of Paying Living Expenses: Very hard  Food Insecurity: No Food Insecurity (06/11/2024)   Hunger Vital Sign    Worried About Running Out of Food in the Last Year: Never true    Ran Out of Food in the Last Year: Never true  Transportation Needs: No Transportation Needs (06/11/2024)   PRAPARE - Administrator, Civil Service (Medical): No     Lack of Transportation (Non-Medical): No  Physical Activity: Unknown (02/29/2024)   Received from [facility]   Exercise Vital Sign    On average, how many days per week do you engage in moderate to strenuous exercise (like a brisk walk)?: 0 days    Minutes of Exercise per Session: Not on file  Stress: No Stress Concern Present (02/29/2024)   Received from [facility] - Occupational Stress Questionnaire    Feeling of Stress : Not at all  Social Connections: Moderately Integrated (02/29/2024)   Received from [facility]   Social Network    How would you rate your social network (family, work, friends)?: Adequate participation with social networks  Intimate Partner Violence: Not At Risk (08/12/2024)   Humiliation, Afraid, Rape, and Kick questionnaire    Fear of Current or Ex-Partner: No    Emotionally Abused: No    Physically Abused: No    Sexually Abused: No    Family History:   Family History  Problem Relation Age of Onset   Diabetes Mellitus II Mother    Cancer Father      ROS:  Please see the history of present illness.    Physical Exam/Data: Vitals:   08/15/24 0059 08/15/24 0528 08/15/24 0750 08/15/24 1154  BP: 137/78 107/77 117/77 105/74  Pulse: 92 91 88 91  Resp: 18 18 16 18   Temp: 98.8 F (37.1 C) 98.1 F (36.7 C) (!) 97.5 F (36.4 C) (!) 97.4 F (36.3 C)  TempSrc: Oral Oral    SpO2: 94% 97% 98% 99%  Weight:      Height:        Intake/Output Summary (Last 24 hours) at 08/15/2024 1334 Last data filed at 08/15/2024 0604 Gross per 24 hour  Intake --  Output 1550 ml  Net -1550 ml      08/12/2024    6:00 AM 06/13/2024    6:17 AM 05/23/2024    9:20 AM  Last 3 Weights  Weight (lbs) 154 lb 12.2 oz 154 lb 12.2 oz 154 lb 1.6 oz  Weight (kg) 70.2 kg 70.2 kg 69.9 kg     Body mass index is 28.31 kg/m.  General:  Sleepy lying in bed in a C-collar HEENT: normal Neck: no JVD Vascular: No carotid bruits; Distal  pulses 2+  bilaterally Cardiac:  Systolic murmur. Regular rate and rhythm Lungs:  clear to auscultation bilaterally, no wheezing, rhonchi or rales  Abd: soft, tender in the left upper and lower quadrants  Ext: Trace edema Skin: warm and dry  Psych:  Normal affect   Relevant CV Studies: Left ventricular ejection fraction, by estimation, is 60 to 65%. The  left ventricle has normal function. The left ventricle has no regional  wall motion abnormalities. Left ventricular diastolic parameters were  normal.   2. Right ventricular systolic function is normal. The right ventricular  size is normal.   3. The mitral valve is normal in structure. Trivial mitral valve  regurgitation. No evidence of mitral stenosis.   4. Rounded echodensity (6 mm X 8 mm) on the ventricular side of  (predominantly) the right coronoary cusp leaflet. No abscess is  appreciated. The aortic valve is abnormal. Aortic valve regurgitation is  not visualized.    Laboratory Data: High Sensitivity Troponin:  No results for input(s): TROPONINIHS in the last 720 hours.   Chemistry Recent Labs  Lab 08/13/24 1116 08/14/24 0433 08/15/24 0453  NA 141 143 138  K 4.1 3.9 4.4  CL 105 106 104  CO2 23 24 17*  GLUCOSE 176* 114* 130*  BUN 5* 5* 6  CREATININE 0.39* 0.32* 0.44  CALCIUM  8.4* 8.9 8.9  GFRNONAA >60 >60 >60  ANIONGAP 13 13 17*    Recent Labs  Lab 08/12/24 0141 08/14/24 0433  PROT 5.5* 5.8*  ALBUMIN 2.0* 2.1*  AST 24 17  ALT 15 11  ALKPHOS 47 46  BILITOT <0.2 0.6   Lipids No results for input(s): CHOL, TRIG, HDL, LABVLDL, LDLCALC, CHOLHDL in the last 168 hours.  Hematology Recent Labs  Lab 08/13/24 1116 08/14/24 0433 08/15/24 0453  WBC 14.4* 15.2* 14.1*  RBC 3.79* 3.85* 3.92  HGB 9.6* 9.8* 10.2*  HCT 30.5* 30.3* 31.4*  MCV 80.5 78.7* 80.1  MCH 25.3* 25.5* 26.0  MCHC 31.5 32.3 32.5  RDW 19.4* 19.5* 19.8*  PLT 341 382 431*   Thyroid  No results for input(s): TSH, FREET4 in  the last 168 hours.  BNPNo results for input(s): BNP, PROBNP in the last 168 hours.  DDimer No results for input(s): DDIMER in the last 168 hours.  Radiology/Studies:  VAS US  UPPER EXTREMITY VENOUS DUPLEX Result Date: 08/12/2024 UPPER VENOUS STUDY  Patient Name:  Alexandria Harvey  Date of Exam:   08/12/2024 Medical Rec #: [id number]         Accession #:    [id number] Date of Birth: 12-19-1980        Patient Gender: F Patient Age:   24 years Exam Location:  [facility] Procedure:      VAS US  UPPER EXTREMITY VENOUS DUPLEX Referring Phys: COLEN GRIMES --------------------------------------------------------------------------------  Indications: Edema, and recent fall. Limitations: Musculoskeletal features and patient contorted and C-collar. Comparison Study: No prior exam. Performing Technologist: Edilia Elden Appl  Examination Guidelines: A complete evaluation includes B-mode imaging, spectral Doppler, color Doppler, and power Doppler as needed of all accessible portions of each vessel. Bilateral testing is considered an integral part of a complete examination. Limited examinations for reoccurring indications may be performed as noted.  Right Findings: +----------+------------+---------+-----------+----------+--------------+ RIGHT     CompressiblePhasicitySpontaneousProperties   Summary     +----------+------------+---------+-----------+----------+--------------+ IJV  Not visualized +----------+------------+---------+-----------+----------+--------------+ Subclavian               Yes       Yes                             +----------+------------+---------+-----------+----------+--------------+ Axillary      Full       Yes       Yes                             +----------+------------+---------+-----------+----------+--------------+ Brachial      Full       Yes       Yes                              +----------+------------+---------+-----------+----------+--------------+ Radial        Full                                                 +----------+------------+---------+-----------+----------+--------------+ Ulnar         Full                                                 +----------+------------+---------+-----------+----------+--------------+ Cephalic      Full       Yes       Yes                             +----------+------------+---------+-----------+----------+--------------+ Basilic       Full       Yes       Yes                             +----------+------------+---------+-----------+----------+--------------+ Right internal jugular vein not visualized due to C-collar. Limited examination due to patient being contorted and not being able to properly position. Best obtainable images.  Left Findings: +----------+------------+---------+-----------+----------+--------------+ LEFT      CompressiblePhasicitySpontaneousProperties   Summary     +----------+------------+---------+-----------+----------+--------------+ IJV                                                 Not visualized +----------+------------+---------+-----------+----------+--------------+ Subclavian               Yes       Yes                             +----------+------------+---------+-----------+----------+--------------+ Axillary      Full       Yes       Yes                             +----------+------------+---------+-----------+----------+--------------+ Brachial      Full                                                 +----------+------------+---------+-----------+----------+--------------+  Radial        Full                                                 +----------+------------+---------+-----------+----------+--------------+ Ulnar                                               Not visualized  +----------+------------+---------+-----------+----------+--------------+ Cephalic      Full       Yes       Yes                             +----------+------------+---------+-----------+----------+--------------+ Basilic       Full       Yes       Yes                             +----------+------------+---------+-----------+----------+--------------+ Left internal jugular vein not visualized due to C-collar and left ulnar vein unaccessible. Limited examination due to patient being contorted and not being able to properly position. Best obtainable images.  Summary:  Right: No evidence of deep vein thrombosis in the upper extremity. No evidence of superficial vein thrombosis in the upper extremity.  Left: No evidence of deep vein thrombosis in the upper extremity. No evidence of superficial vein thrombosis in the upper extremity.  *See table(s) above for measurements and observations.  Diagnosing physician: Alexandria Harvey Electronically signed by Alexandria Harvey on 08/12/2024 at 8:12:40 PM.    Final    VAS US  LOWER EXTREMITY VENOUS (DVT) Result Date: 08/12/2024  Lower Venous DVT Study Patient Name:  Alexandria Harvey  Date of Exam:   08/12/2024 Medical Rec #: [id number]         Accession #:    [id number] Date of Birth: 05-05-1981        Patient Gender: F Patient Age:   47 years Exam Location:  [facility] Procedure:      VAS US  LOWER EXTREMITY VENOUS (DVT) Referring Phys: COLEN GRIMES --------------------------------------------------------------------------------  Indications: Swelling, Edema, and recent fall.  Comparison Study: No prior exam. Performing Technologist: Edilia Elden Appl  Examination Guidelines: A complete evaluation includes B-mode imaging, spectral Doppler, color Doppler, and power Doppler as needed of all accessible portions of each vessel. Bilateral testing is considered an integral part of a complete examination. Limited examinations for reoccurring indications may be  performed as noted. The reflux portion of the exam is performed with the patient in reverse Trendelenburg.  +---------+---------------+---------+-----------+----------+-------------------+ RIGHT    CompressibilityPhasicitySpontaneityPropertiesThrombus Aging      +---------+---------------+---------+-----------+----------+-------------------+ CFV      Partial        Yes      Yes                                      +---------+---------------+---------+-----------+----------+-------------------+ SFJ      Full           Yes      Yes                                      +---------+---------------+---------+-----------+----------+-------------------+  FV Prox  Partial                                                          +---------+---------------+---------+-----------+----------+-------------------+ FV Mid   None           Yes      Yes                  Not well visualized +---------+---------------+---------+-----------+----------+-------------------+ FV DistalFull           Yes      Yes                                      +---------+---------------+---------+-----------+----------+-------------------+ PFV      Partial        Yes      Yes                                      +---------+---------------+---------+-----------+----------+-------------------+ POP      Full           Yes      Yes                                      +---------+---------------+---------+-----------+----------+-------------------+ PTV                                                   Not well                                                                  visualized, patent                                                        by color.           +---------+---------------+---------+-----------+----------+-------------------+ PERO                                                  Not well                                                                   visualized, patent  by color.           +---------+---------------+---------+-----------+----------+-------------------+ EIV                     Yes      Yes                                      +---------+---------------+---------+-----------+----------+-------------------+ Deep vein thrombosis noted in the right common femoral the proximal and middle femoral veins and the profunda vein.  +---------+---------------+---------+-----------+----------+-------------------+ LEFT     CompressibilityPhasicitySpontaneityPropertiesThrombus Aging      +---------+---------------+---------+-----------+----------+-------------------+ CFV      Partial        Yes      Yes                                      +---------+---------------+---------+-----------+----------+-------------------+ SFJ      Full           Yes      Yes                                      +---------+---------------+---------+-----------+----------+-------------------+ FV Prox  None           No       No                                       +---------+---------------+---------+-----------+----------+-------------------+ FV Mid   None           No       No                                       +---------+---------------+---------+-----------+----------+-------------------+ FV DistalFull           Yes      Yes                                      +---------+---------------+---------+-----------+----------+-------------------+ PFV      Partial        Yes      Yes                                      +---------+---------------+---------+-----------+----------+-------------------+ POP      Full           Yes      Yes                                      +---------+---------------+---------+-----------+----------+-------------------+ PTV                                                   Not well  visualized, patent                                                        by color.           +---------+---------------+---------+-----------+----------+-------------------+ PERO                                                  Not well                                                                  visualized, patent                                                        by color.           +---------+---------------+---------+-----------+----------+-------------------+ EIV                     Yes      Yes                                      +---------+---------------+---------+-----------+----------+-------------------+ Deep vein thrombosis noted in the left common femoral the proximal and middle femoral veins and the profunda vein.    Summary: RIGHT: - Findings consistent with acute deep vein thrombosis involving the right common femoral vein, right femoral vein, and right proximal profunda vein.  - No cystic structure found in the popliteal fossa.  LEFT: - Findings consistent with acute deep vein thrombosis involving the left common femoral vein, left femoral vein, and left proximal profunda vein.  - No cystic structure found in the popliteal fossa.  *See table(s) above for measurements and observations. Electronically signed by Alexandria Harvey on 08/12/2024 at 8:12:15 PM.    Final    ECHOCARDIOGRAM COMPLETE Result Date: 08/12/2024    ECHOCARDIOGRAM REPORT   Patient Name:   Alexandria Harvey Date of Exam: 08/12/2024 Medical Rec #:  [id number]        Height:       62.0 in Accession #:    [id number]       Weight:       154.8 lb Date of Birth:  1981/08/07       BSA:          1.714 m Patient Age:    43 years         BP:           117/77 mmHg Patient Gender: F                HR:           78 bpm. Exam Location:  Inpatient Procedure: 2D Echo, Color Doppler, Cardiac Doppler and Intracardiac  Opacification Agent (Both Spectral and Color Flow  Doppler were            utilized during procedure). STAT ECHO Indications:    Endocarditis  History:        Patient has no prior history of Echocardiogram examinations.                 Risk Factors:Hypertension, Diabetes and Dyslipidemia. GERD.  Sonographer:    Alexandria Harvey RDCS Referring Phys: [phone] [facility]  Sonographer Comments: Suboptimal parasternal window. Body Position. IMPRESSIONS  1. Left ventricular ejection fraction, by estimation, is 60 to 65%. The left ventricle has normal function. The left ventricle has no regional wall motion abnormalities. Left ventricular diastolic parameters were normal.  2. Right ventricular systolic function is normal. The right ventricular size is normal.  3. The mitral valve is normal in structure. Trivial mitral valve regurgitation. No evidence of mitral stenosis.  4. Rounded echodensity (6 mm X 8 mm) on the ventricular side of (predominantly) the right coronoary cusp leaflet. No abscess is appreciated. The aortic valve is abnormal. Aortic valve regurgitation is not visualized. Comparison(s): No prior Echocardiogram. Conclusion(s)/Recommendation(s): In the clinical picture of bactermia, the study could be consistent with infective endocarditis. Consider TEE if clinically indicated and within goals of care. FINDINGS  Left Ventricle: Left ventricular ejection fraction, by estimation, is 60 to 65%. The left ventricle has normal function. The left ventricle has no regional wall motion abnormalities. Definity  contrast agent was given IV to delineate the left ventricular  endocardial borders. The left ventricular internal cavity size was normal in size. There is no left ventricular hypertrophy. Left ventricular diastolic parameters were normal. Right Ventricle: The right ventricular size is normal. No increase in right ventricular wall thickness. Right ventricular systolic function is normal. Left Atrium: Left atrial size was normal in size. Right Atrium: Right atrial size  was normal in size. Pericardium: There is no evidence of pericardial effusion. Mitral Valve: The mitral valve is normal in structure. Trivial mitral valve regurgitation. No evidence of mitral valve stenosis. Tricuspid Valve: The tricuspid valve is normal in structure. Tricuspid valve regurgitation is not demonstrated. No evidence of tricuspid stenosis. Aortic Valve: Rounded echodensity (6 mm X 8 mm) on the ventricular side of (predominantly) the right coronoary cusp leaflet. No abscess is appreciated. The aortic valve is abnormal. Aortic valve regurgitation is not visualized. Aortic valve peak gradient  measures 10.5 mmHg. Pulmonic Valve: The pulmonic valve was normal in structure. Pulmonic valve regurgitation is trivial. No evidence of pulmonic stenosis. Aorta: The aortic root and ascending aorta are structurally normal, with no evidence of dilitation. IAS/Shunts: No atrial level shunt detected by color flow Doppler.  LEFT VENTRICLE PLAX 2D LVIDd:         4.70 cm      Diastology LVIDs:         3.10 cm      LV e' medial:    9.14 cm/s LV PW:         0.70 cm      LV E/e' medial:  9.6 LV IVS:        0.70 cm      LV e' lateral:   15.60 cm/s LVOT diam:     2.00 cm      LV E/e' lateral: 5.6 LVOT Area:     3.14 cm  LV Volumes (MOD) LV vol d, MOD A2C: 81.3 ml LV vol d, MOD A4C: 107.0 ml LV vol s, MOD A2C: 33.3  ml LV vol s, MOD A4C: 40.1 ml LV SV MOD A2C:     48.0 ml LV SV MOD A4C:     107.0 ml LV SV MOD BP:      55.8 ml RIGHT VENTRICLE             IVC RV Basal diam:  3.20 cm     IVC diam: 1.40 cm RV S prime:     14.10 cm/s TAPSE (M-mode): 1.8 cm      PULMONARY VEINS                             Diastolic Velocity: 31.20 cm/s                             S/D Velocity:       1.90                             Systolic Velocity:  59.70 cm/s LEFT ATRIUM             Index        RIGHT ATRIUM           Index LA diam:        3.00 cm 1.75 cm/m   RA Area:     10.40 cm LA Vol (A2C):   51.8 ml 30.22 ml/m  RA Volume:   20.10 ml  11.72  ml/m LA Vol (A4C):   31.9 ml 18.61 ml/m LA Biplane Vol: 40.6 ml 23.68 ml/m  AORTIC VALVE AV Area (Vmax): 2.27 cm AV Vmax:        162.00 cm/s AV Peak Grad:   10.5 mmHg LVOT Vmax:      117.00 cm/s  AORTA Ao Root diam: 2.00 cm Ao Asc diam:  2.70 cm MITRAL VALVE MV Area (PHT): 4.21 cm    SHUNTS MV Decel Time: 180 msec    Systemic Diam: 2.00 cm MV E velocity: 87.40 cm/s MV A velocity: 80.30 cm/s MV E/A ratio:  1.09 Alexandria Leavens MD Electronically signed by Alexandria Leavens MD Signature Date/Time: 08/12/2024/10:18:45 AM    Final    DG Chest Portable 1 View Result Date: 08/12/2024 EXAM: 1 VIEW(S) XRAY OF THE CHEST 08/12/2024 05:55:00 AM COMPARISON: 06/11/2024 CLINICAL HISTORY: sepsis FINDINGS: LINES, TUBES AND DEVICES: Left PICC line in place. LUNGS AND PLEURA: Hypoinflated lungs. Asymmetric elevation of the right hemidiaphragm, unchanged. No pleural effusion. No pneumothorax. HEART AND MEDIASTINUM: No acute abnormality of the cardiac and mediastinal silhouettes. BONES AND SOFT TISSUES: Degenerative changes are identified at the glenohumeral joint. 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 IMPRESSION: 1. Hypoinflated lungs with asymmetric elevation of the right hemidiaphragm, unchanged. 2. Left PICC line in appropriate position. Electronically signed by: Alexandria Calk MD 08/12/2024 06:13 AM EST RP Workstation: [id number]   CT ABDOMEN PELVIS W CONTRAST Result Date: 08/12/2024 EXAM: CT ABDOMEN AND PELVIS WITH CONTRAST 08/12/2024 04:11:48 AM TECHNIQUE: CT of the abdomen and pelvis was performed with the administration of 75 mL of iohexol  (OMNIPAQUE ) 350 MG/ML injection. Multiplanar reformatted images are provided for review. Automated exposure control, iterative reconstruction, and/or weight-based adjustment of the mA/kV was utilized to reduce the radiation dose to as low as reasonably achievable. COMPARISON: Findings are compared to 05/23/2024.  CLINICAL HISTORY: Sepsis; sacral wound. FINDINGS: LOWER  CHEST: Central venous catheter seen at the superior cavoatrial junction. Moderate multilevel coronary artery calcifications. LIVER: The liver is unremarkable. GALLBLADDER AND BILE DUCTS: Gallbladder is unremarkable. No biliary ductal dilatation. SPLEEN: No acute abnormality. PANCREAS: No acute abnormality. ADRENAL GLANDS: No acute abnormality. KIDNEYS, URETERS AND BLADDER: No stones in the kidneys or ureters. No hydronephrosis. No perinephric or periureteral stranding. Urinary bladder is unremarkable. GI AND BOWEL: Large stool burden without evidence of obstruction. The rectosigmoid colon, however, is relatively decompressed. Appendix normal. The stomach, small bowel, and large bowel are otherwise unremarkable. PERITONEUM AND RETROPERITONEUM: No ascites. No free air. VASCULATURE: Mild aortoiliac atherosclerotic calcification. No aortic aneurysm. LYMPH NODES: No lymphadenopathy. REPRODUCTIVE ORGANS: No acute abnormality. BONES AND SOFT TISSUES: Since the prior examination, a sacral decubitus wound has developed, which approaches the cortex of the subjacent sacrum (75/3) in keeping with a grade 3-4 sacral decubitus wound. No osseous erosion to indicate superimposed osteomyelitis. 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 No acute bone abnormality. IMPRESSION: 1. 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 2. Large stool burden without evidence of obstruction, with relatively decompressed rectosigmoid colon. Electronically signed by: Alexandria Molt MD 08/12/2024 04:43 AM EST RP Workstation: [id number]     Assessment and Plan: Aortic valve lesion Patient currently admitted for sepsis rule out. Found to have echodensity 5mm x 8mm on the ventricular side of the right coronary cusp of the aortic valve without abscess. ID consulted  and have low concern for infection. Blood cultures has been negative to date. Lactic acid elevated and attributed to metformin  use. CRP 0.8. WBC elevated at 14.1. Does have bilateral DVTs. Differential includes infective vs non-infective endocarditis vegetation, aortic valve sclerosis, cardiac tumor, and thrombus. Systolic murmur would favor a degenerative valve. Concern for hypercoagulable state given DVTs. Will need TEE to further differentiate lesion. TEE is high risk in setting of c-spine fractures and we will need clearance from Neurosurgery. Appreciate ID input on whether TEE indicated at this time for further sepsis workup.  -will need neurosurgery clearance for TEE given her C-spine fracture.  -Re-engage ID to see if they would like a TEE for sepsis workup -Follow-up autoimmune workup, so far negative ANA and RF -Sepsis rule out per primary/ID -continue Eliquis  5mg  BID  Per primary/ID/psych DMII Rash Cervical fractures Constipation Cognitive deficits   For questions or updates, please contact Alexandria Harvey Please consult [email or web address] for contact info under   Signed, Alexandria Morrison, MD  08/15/2024 1:34 PM   Patient seen and examined.  Agree with above documentation.  Ms. Bump is a 43 year old female with a history of T2DM, hypertension, hyperlipidemia, schizophrenia, recent cervical spinal fracture, sacral wound who were consulted by Dr. 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  She was admitted with encephalopathy.  There was concern for infection but ID had low suspicion for infection and antibiotics discontinued.  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  Blood cultures have been negative.  Also noted to have bilateral DVTs. On exam, patient is somnolent but arousable, regular rate and rhythm, 2/6 systolic murmur, lungs CTAB, no lower extremity edema or JVD.  For her aortic valve mass, per ID low suspicion for infection given negative blood cultures  and has been afebrile off antibiotics.  TEE would be indicated for better evaluation but given her cervical spine fracture in September and remains in c-collar, will need to  consult neurosurgery for clearance prior to TEE.  Lonni LITTIE Nanas, MD

## 2024-08-15 NOTE — Progress Notes (Signed)
 Progress Note   Patient: Alexandria Harvey FMW:[id number] DOB: 1980/09/22 DOA: 08/11/2024  DOS: the patient was seen and examined on 08/15/2024   Brief hospital course:  43 year old female- Under guardianship of care for DSS-phone #[phone]--- apparently also has a caregiver [name]/[name] who is the group home, DM, mental retardation, Schizoaffective disorder, previous hallucination, sacral decubitus ulcer, cervical fracture with collar - p/w fever and encephalopathy.   Assessment and Plan:   Concern for sepsis - Encephalopathy, leukocytosis, lactic acidosis, given concern for sepsis.  Etiology unclear possible sacral decubitus ulcer versus PICC line infection.  Blood cultures, IV fluid bolus, empiric antibiotics initiated at presentation.  Monitor blood pressure closely.  Evaluated by infectious disease.  Antibiotics stopped.  Currently on line holiday.  Repeating blood cultures 12/8 & 12/9 NGTD.   Acute metabolic encephalopathy - Multifactorial etiology but appears to be improving.  Polypharmacy, dehydration, possible sepsis.  Medications weaned back.  Appears to be showing improvement.  Patient alert, communicative.  Resides at group home.  Monitor closely.   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  Evaluated by infectious disease.  Only minimal concern for infectious etiology, other differentials include Libman-Sacks.  Pending autoimmune workup.  Follow-up blood cultures closely.  Will consult cardiology.     Acute rash - Etiology unclear but thought to be possible drug reaction versus excoriations from bedbugs.  Antibiotics stopped.  Contact precautions.  Will continue monitor closely.  Appears to be improving.   Diabetes mellitus - Insulin  sliding scale on board.   History of cervical fractures with polytrauma - Remains in c-collar.  Follow-up with Dr. Nundkumar-minimize meds as above because of mild encephalopathy.   Acute DVT BL LE - Lower extremity Dopplers  noting BL proximal femoral vein DVTs.  Continue Eliquis .  Unclear if provoked or unprovoked.  Pending hypercoagulable workup.   Severe constipation - Noted incidentally on CT scan.  Bowel regimen on board.  No abdominal pain noted.   Cognitive deficits/schizophrenia - Resides at a group home.  guardianship of care for DSS-phone #[phone]--- apparently also has a caregiver [name]/[name] who is the group home.  Patient evaluated by psychiatry.  Medication recommendations per psych.   Goals of care - Awaiting definitive track of care, infectious versus autoimmune.  Likely patient to return to group home afterwards.  TOC following closely.   Subjective: Patient resting comfortably this morning.  Bit lethargic but in no acute distress.  No acute events overnight.  Physical Exam:  Vitals:   08/14/24 2034 08/15/24 0059 08/15/24 0528 08/15/24 0750  BP: (!) 146/92 137/78 107/77 117/77  Pulse: (!) 101 92 91 88  Resp: 20 18 18 16   Temp: 98.6 F (37 C) 98.8 F (37.1 C) 98.1 F (36.7 C) (!) 97.5 F (36.4 C)  TempSrc: Oral Oral Oral   SpO2: 97% 94% 97% 98%  Weight:      Height:        GENERAL:  Alert, pleasant, no acute distress  HEENT:  EOMI, c-collar CARDIOVASCULAR:  RRR, no murmurs appreciated RESPIRATORY:  Clear to auscultation, no wheezing, rales, or rhonchi GASTROINTESTINAL:  Soft, nontender, nondistended EXTREMITIES:  No LE edema bilaterally NEURO:  No new focal deficits appreciated SKIN:  No rashes noted PSYCH:  Appropriate mood and affect    Data Reviewed:  Imaging Studies: VAS US  UPPER EXTREMITY VENOUS DUPLEX Result Date: 08/12/2024 UPPER VENOUS STUDY  Patient Name:  Alexandria Harvey  Date of Exam:   08/12/2024 Medical Rec #:  [id number]         Accession #:    [id number] Date of Birth: [birth year]        Patient Gender: F Patient Age:   68 years Exam Location:  [facility] Procedure:      VAS US  UPPER EXTREMITY VENOUS DUPLEX Referring Phys: COLEN GRIMES  --------------------------------------------------------------------------------  Indications: Edema, and recent fall. Limitations: Musculoskeletal features and patient contorted and C-collar. Comparison Study: No prior exam. Performing Technologist: Edilia Elden Appl  Examination Guidelines: A complete evaluation includes B-mode imaging, spectral Doppler, color Doppler, and power Doppler as needed of all accessible portions of each vessel. Bilateral testing is considered an integral part of a complete examination. Limited examinations for reoccurring indications may be performed as noted.  Right Findings: +----------+------------+---------+-----------+----------+--------------+ RIGHT     CompressiblePhasicitySpontaneousProperties   Summary     +----------+------------+---------+-----------+----------+--------------+ IJV                                                 Not visualized +----------+------------+---------+-----------+----------+--------------+ Subclavian               Yes       Yes                             +----------+------------+---------+-----------+----------+--------------+ Axillary      Full       Yes       Yes                             +----------+------------+---------+-----------+----------+--------------+ Brachial      Full       Yes       Yes                             +----------+------------+---------+-----------+----------+--------------+ Radial        Full                                                 +----------+------------+---------+-----------+----------+--------------+ Ulnar         Full                                                 +----------+------------+---------+-----------+----------+--------------+ Cephalic      Full       Yes       Yes                             +----------+------------+---------+-----------+----------+--------------+ Basilic       Full       Yes       Yes                              +----------+------------+---------+-----------+----------+--------------+ Right internal jugular vein not visualized due to C-collar. Limited examination due to patient being contorted and not being able to properly position. Best obtainable images.  Left Findings: +----------+------------+---------+-----------+----------+--------------+  LEFT      CompressiblePhasicitySpontaneousProperties   Summary     +----------+------------+---------+-----------+----------+--------------+ IJV                                                 Not visualized +----------+------------+---------+-----------+----------+--------------+ Subclavian               Yes       Yes                             +----------+------------+---------+-----------+----------+--------------+ Axillary      Full       Yes       Yes                             +----------+------------+---------+-----------+----------+--------------+ Brachial      Full                                                 +----------+------------+---------+-----------+----------+--------------+ Radial        Full                                                 +----------+------------+---------+-----------+----------+--------------+ Ulnar                                               Not visualized +----------+------------+---------+-----------+----------+--------------+ Cephalic      Full       Yes       Yes                             +----------+------------+---------+-----------+----------+--------------+ Basilic       Full       Yes       Yes                             +----------+------------+---------+-----------+----------+--------------+ Left internal jugular vein not visualized due to C-collar and left ulnar vein unaccessible. Limited examination due to patient being contorted and not being able to properly position. Best obtainable images.  Summary:  Right: No evidence of deep vein thrombosis in the upper  extremity. No evidence of superficial vein thrombosis in the upper extremity.  Left: No evidence of deep vein thrombosis in the upper extremity. No evidence of superficial vein thrombosis in the upper extremity.  *See table(s) above for measurements and observations.  Diagnosing physician: Debby Robertson Electronically signed by Debby Robertson on 08/12/2024 at 8:12:40 PM.    Final    VAS US  LOWER EXTREMITY VENOUS (DVT) Result Date: 08/12/2024  Lower Venous DVT Study Patient Name:  Alexandria Harvey  Date of Exam:   08/12/2024 Medical Rec #: [id number]         Accession #:    [id number] Date of Birth: Oct 08, 1980        Patient Gender:  F Patient Age:   11 years Exam Location:  [facility] Procedure:      VAS US  LOWER EXTREMITY VENOUS (DVT) Referring Phys: [facility] SAMTANI --------------------------------------------------------------------------------  Indications: Swelling, Edema, and recent fall.  Comparison Study: No prior exam. Performing Technologist: Edilia Elden Appl  Examination Guidelines: A complete evaluation includes B-mode imaging, spectral Doppler, color Doppler, and power Doppler as needed of all accessible portions of each vessel. Bilateral testing is considered an integral part of a complete examination. Limited examinations for reoccurring indications may be performed as noted. The reflux portion of the exam is performed with the patient in reverse Trendelenburg.  +---------+---------------+---------+-----------+----------+-------------------+ RIGHT    CompressibilityPhasicitySpontaneityPropertiesThrombus Aging      +---------+---------------+---------+-----------+----------+-------------------+ CFV      Partial        Yes      Yes                                      +---------+---------------+---------+-----------+----------+-------------------+ SFJ      Full           Yes      Yes                                       +---------+---------------+---------+-----------+----------+-------------------+ FV Prox  Partial                                                          +---------+---------------+---------+-----------+----------+-------------------+ FV Mid   None           Yes      Yes                  Not well visualized +---------+---------------+---------+-----------+----------+-------------------+ FV DistalFull           Yes      Yes                                      +---------+---------------+---------+-----------+----------+-------------------+ PFV      Partial        Yes      Yes                                      +---------+---------------+---------+-----------+----------+-------------------+ POP      Full           Yes      Yes                                      +---------+---------------+---------+-----------+----------+-------------------+ PTV                                                   Not well  visualized, patent                                                        by color.           +---------+---------------+---------+-----------+----------+-------------------+ PERO                                                  Not well                                                                  visualized, patent                                                        by color.           +---------+---------------+---------+-----------+----------+-------------------+ EIV                     Yes      Yes                                      +---------+---------------+---------+-----------+----------+-------------------+ Deep vein thrombosis noted in the right common femoral the proximal and middle femoral veins and the profunda vein.  +---------+---------------+---------+-----------+----------+-------------------+ LEFT      CompressibilityPhasicitySpontaneityPropertiesThrombus Aging      +---------+---------------+---------+-----------+----------+-------------------+ CFV      Partial        Yes      Yes                                      +---------+---------------+---------+-----------+----------+-------------------+ SFJ      Full           Yes      Yes                                      +---------+---------------+---------+-----------+----------+-------------------+ FV Prox  None           No       No                                       +---------+---------------+---------+-----------+----------+-------------------+ FV Mid   None           No       No                                       +---------+---------------+---------+-----------+----------+-------------------+  FV DistalFull           Yes      Yes                                      +---------+---------------+---------+-----------+----------+-------------------+ PFV      Partial        Yes      Yes                                      +---------+---------------+---------+-----------+----------+-------------------+ POP      Full           Yes      Yes                                      +---------+---------------+---------+-----------+----------+-------------------+ PTV                                                   Not well                                                                  visualized, patent                                                        by color.           +---------+---------------+---------+-----------+----------+-------------------+ PERO                                                  Not well                                                                  visualized, patent                                                        by color.           +---------+---------------+---------+-----------+----------+-------------------+ EIV                      Yes      Yes                                      +---------+---------------+---------+-----------+----------+-------------------+  Deep vein thrombosis noted in the left common femoral the proximal and middle femoral veins and the profunda vein.    Summary: RIGHT: - Findings consistent with acute deep vein thrombosis involving the right common femoral vein, right femoral vein, and right proximal profunda vein.  - No cystic structure found in the popliteal fossa.  LEFT: - Findings consistent with acute deep vein thrombosis involving the left common femoral vein, left femoral vein, and left proximal profunda vein.  - No cystic structure found in the popliteal fossa.  *See table(s) above for measurements and observations. Electronically signed by Debby Robertson on 08/12/2024 at 8:12:15 PM.    Final    ECHOCARDIOGRAM COMPLETE Result Date: 08/12/2024    ECHOCARDIOGRAM REPORT   Patient Name:   Alexandria Harvey Date of Exam: 08/12/2024 Medical Rec #:  [id number]        Height:       62.0 in Accession #:    [id number]       Weight:       154.8 lb Date of Birth:  [birth year]       BSA:          1.714 m Patient Age:    43 years         BP:           117/77 mmHg Patient Gender: F                HR:           78 bpm. Exam Location:  Inpatient Procedure: 2D Echo, Color Doppler, Cardiac Doppler and Intracardiac            Opacification Agent (Both Spectral and Color Flow Doppler were            utilized during procedure). STAT ECHO Indications:    Endocarditis  History:        Patient has no prior history of Echocardiogram examinations.                 Risk Factors:Hypertension, Diabetes and Dyslipidemia. GERD.  Sonographer:    Logan Shove RDCS Referring Phys: [phone] [facility]  Sonographer Comments: Suboptimal parasternal window. Body Position. IMPRESSIONS  1. Left ventricular ejection fraction, by estimation, is 60 to 65%. The left ventricle has normal function. The left ventricle has no regional wall motion  abnormalities. Left ventricular diastolic parameters were normal.  2. Right ventricular systolic function is normal. The right ventricular size is normal.  3. The mitral valve is normal in structure. Trivial mitral valve regurgitation. No evidence of mitral stenosis.  4. Rounded echodensity (6 mm X 8 mm) on the ventricular side of (predominantly) the right coronoary cusp leaflet. No abscess is appreciated. The aortic valve is abnormal. Aortic valve regurgitation is not visualized. Comparison(s): No prior Echocardiogram. Conclusion(s)/Recommendation(s): In the clinical picture of bactermia, the study could be consistent with infective endocarditis. Consider TEE if clinically indicated and within goals of care. FINDINGS  Left Ventricle: Left ventricular ejection fraction, by estimation, is 60 to 65%. The left ventricle has normal function. The left ventricle has no regional wall motion abnormalities. Definity  contrast agent was given IV to delineate the left ventricular  endocardial borders. The left ventricular internal cavity size was normal in size. There is no left ventricular hypertrophy. Left ventricular diastolic parameters were normal. Right Ventricle: The right ventricular size is normal. No increase in right ventricular wall thickness. Right ventricular systolic function is normal. Left Atrium: Left atrial size was normal  in size. Right Atrium: Right atrial size was normal in size. Pericardium: There is no evidence of pericardial effusion. Mitral Valve: The mitral valve is normal in structure. Trivial mitral valve regurgitation. No evidence of mitral valve stenosis. Tricuspid Valve: The tricuspid valve is normal in structure. Tricuspid valve regurgitation is not demonstrated. No evidence of tricuspid stenosis. Aortic Valve: Rounded echodensity (6 mm X 8 mm) on the ventricular side of (predominantly) the right coronoary cusp leaflet. No abscess is appreciated. The aortic valve is abnormal. Aortic valve  regurgitation is not visualized. Aortic valve peak gradient  measures 10.5 mmHg. Pulmonic Valve: The pulmonic valve was normal in structure. Pulmonic valve regurgitation is trivial. No evidence of pulmonic stenosis. Aorta: The aortic root and ascending aorta are structurally normal, with no evidence of dilitation. IAS/Shunts: No atrial level shunt detected by color flow Doppler.  LEFT VENTRICLE PLAX 2D LVIDd:         4.70 cm      Diastology LVIDs:         3.10 cm      LV e' medial:    9.14 cm/s LV PW:         0.70 cm      LV E/e' medial:  9.6 LV IVS:        0.70 cm      LV e' lateral:   15.60 cm/s LVOT diam:     2.00 cm      LV E/e' lateral: 5.6 LVOT Area:     3.14 cm  LV Volumes (MOD) LV vol d, MOD A2C: 81.3 ml LV vol d, MOD A4C: 107.0 ml LV vol s, MOD A2C: 33.3 ml LV vol s, MOD A4C: 40.1 ml LV SV MOD A2C:     48.0 ml LV SV MOD A4C:     107.0 ml LV SV MOD BP:      55.8 ml RIGHT VENTRICLE             IVC RV Basal diam:  3.20 cm     IVC diam: 1.40 cm RV S prime:     14.10 cm/s TAPSE (M-mode): 1.8 cm      PULMONARY VEINS                             Diastolic Velocity: 31.20 cm/s                             S/D Velocity:       1.90                             Systolic Velocity:  59.70 cm/s LEFT ATRIUM             Index        RIGHT ATRIUM           Index LA diam:        3.00 cm 1.75 cm/m   RA Area:     10.40 cm LA Vol (A2C):   51.8 ml 30.22 ml/m  RA Volume:   20.10 ml  11.72 ml/m LA Vol (A4C):   31.9 ml 18.61 ml/m LA Biplane Vol: 40.6 ml 23.68 ml/m  AORTIC VALVE AV Area (Vmax): 2.27 cm AV Vmax:        162.00 cm/s AV Peak Grad:   10.5 mmHg LVOT Vmax:  117.00 cm/s  AORTA Ao Root diam: 2.00 cm Ao Asc diam:  2.70 cm MITRAL VALVE MV Area (PHT): 4.21 cm    SHUNTS MV Decel Time: 180 msec    Systemic Diam: 2.00 cm MV E velocity: 87.40 cm/s MV A velocity: 80.30 cm/s MV E/A ratio:  1.09 Stanly Leavens MD Electronically signed by Stanly Leavens MD Signature Date/Time: 08/12/2024/10:18:45 AM    Final     DG Chest Portable 1 View Result Date: 08/12/2024 EXAM: 1 VIEW(S) XRAY OF THE CHEST 08/12/2024 05:55:00 AM COMPARISON: 06/11/2024 CLINICAL HISTORY: sepsis FINDINGS: LINES, TUBES AND DEVICES: Left PICC line in place. LUNGS AND PLEURA: Hypoinflated lungs. Asymmetric elevation of the right hemidiaphragm, unchanged. No pleural effusion. No pneumothorax. HEART AND MEDIASTINUM: No acute abnormality of the cardiac and mediastinal silhouettes. BONES AND SOFT TISSUES: Degenerative changes are identified at the glenohumeral joint. 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 IMPRESSION: 1. Hypoinflated lungs with asymmetric elevation of the right hemidiaphragm, unchanged. 2. Left PICC line in appropriate position. Electronically signed by: Waddell Calk MD 08/12/2024 06:13 AM EST RP Workstation: [id number]   CT ABDOMEN PELVIS W CONTRAST Result Date: 08/12/2024 EXAM: CT ABDOMEN AND PELVIS WITH CONTRAST 08/12/2024 04:11:48 AM TECHNIQUE: CT of the abdomen and pelvis was performed with the administration of 75 mL of iohexol  (OMNIPAQUE ) 350 MG/ML injection. Multiplanar reformatted images are provided for review. Automated exposure control, iterative reconstruction, and/or weight-based adjustment of the mA/kV was utilized to reduce the radiation dose to as low as reasonably achievable. COMPARISON: Findings are compared to 05/23/2024. CLINICAL HISTORY: Sepsis; sacral wound. FINDINGS: LOWER CHEST: Central venous catheter seen at the superior cavoatrial junction. Moderate multilevel coronary artery calcifications. LIVER: The liver is unremarkable. GALLBLADDER AND BILE DUCTS: Gallbladder is unremarkable. No biliary ductal dilatation. SPLEEN: No acute abnormality. PANCREAS: No acute abnormality. ADRENAL GLANDS: No acute abnormality. KIDNEYS, URETERS AND BLADDER: No stones in the kidneys or ureters. No hydronephrosis. No perinephric or periureteral stranding. Urinary bladder  is unremarkable. GI AND BOWEL: Large stool burden without evidence of obstruction. The rectosigmoid colon, however, is relatively decompressed. Appendix normal. The stomach, small bowel, and large bowel are otherwise unremarkable. PERITONEUM AND RETROPERITONEUM: No ascites. No free air. VASCULATURE: Mild aortoiliac atherosclerotic calcification. No aortic aneurysm. LYMPH NODES: No lymphadenopathy. REPRODUCTIVE ORGANS: No acute abnormality. BONES AND SOFT TISSUES: Since the prior examination, a sacral decubitus wound has developed, which approaches the cortex of the subjacent sacrum (75/3) in keeping with a grade 3-4 sacral decubitus wound. No osseous erosion to indicate superimposed osteomyelitis. 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 No acute bone abnormality. IMPRESSION: 1. 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 2. Large stool burden without evidence of obstruction, with relatively decompressed rectosigmoid colon. Electronically signed by: Dorethia Molt MD 08/12/2024 04:43 AM EST RP Workstation: [id number]    There are no new results to review at this time.  Previous records (including but not limited to H&P, progress notes, nursing notes, TOC management) were reviewed in assessment of this patient.  Labs: CBC: Recent Labs  Lab 08/12/24 0141 08/13/24 1116 08/14/24 0433 08/15/24 0453  WBC 14.3* 14.4* 15.2* 14.1*  NEUTROABS 6.1  --   --   --   HGB 9.6* 9.6* 9.8* 10.2*  HCT 30.3* 30.5* 30.3* 31.4*  MCV 80.4 80.5 78.7* 80.1  PLT 278 341 382 431*   Basic  Metabolic Panel: Recent Labs  Lab 08/12/24 0141 08/13/24 1116 08/14/24 0433 08/15/24 0453  NA 137 141 143 138  K 3.6 4.1 3.9 4.4  CL 100 105 106 104  CO2 22 23 24  17*  GLUCOSE 129* 176* 114* 130*  BUN 10 5* 5* 6  CREATININE 0.46 0.39* 0.32* 0.44  CALCIUM  8.7* 8.4* 8.9 8.9   Liver Function  Tests: Recent Labs  Lab 08/12/24 0141 08/14/24 0433  AST 24 17  ALT 15 11  ALKPHOS 47 46  BILITOT <0.2 0.6  PROT 5.5* 5.8*  ALBUMIN 2.0* 2.1*   CBG: No results for input(s): GLUCAP in the last 168 hours.  Scheduled Meds:  apixaban   5 mg Oral BID   cloZAPine   50 mg Oral BID   collagenase    Topical Daily   diphenhydrAMINE -zinc  acetate   Topical BID   divalproex   250 mg Oral TID   gabapentin   100 mg Oral TID   polyethylene glycol  17 g Oral BID   senna-docusate  1 tablet Oral Daily   Continuous Infusions: PRN Meds:.acetaminophen  **OR** acetaminophen , ondansetron  **OR** ondansetron  (ZOFRAN ) IV, oxyCODONE   Family Communication: Not at bedside  Disposition: Status is: Inpatient Remains inpatient appropriate because: Aortic valve mass     Time spent: 36 minutes  Length of inpatient stay: 3 days  Author: Carliss LELON Canales, DO 08/15/2024 11:45 AM  For on call review [email or web address].

## 2024-08-15 NOTE — Progress Notes (Addendum)
 [facility] Infectious Diseases Follow Up Note  Patient Identification: Patient Name: Alexandria Harvey MRN: [id number] Admit Date: 08/11/2024 11:43 PM Age: 43 y.o.Today's Date: 08/15/2024  Reason for Visit: Abnormal TTE  Principal Problem:   Petechial rash Active Problems:   Sepsis (HCC)   Aortic valve mass   Multiple fractures of thoracic spine, closed (HCC)   Closed fracture of multiple cervical vertebrae (HCC)   Drug rash   Cognitive developmental delay   Lactic acid acidosis   Schizoaffective disorder (HCC)   Antibiotics: Off antibiotics since 12/6  Lines/Hardwares:   Interval Events: Remains afebrile Labs remarkable for WBC down to 14.1, hemoglobin 10.2, platelets 431 12/6 and 12/8 blood cx NGTD   Assessment 43 year old female with prior history of mental retardation, seizure affective disorder, DM, cervical fracture with collar, sacral decubitus ulcer, s/p cervical fracture with central cord syndrome, being treated with antibiotics at SNF,  under guardianship who presented to the ED with encephalopathy, rash and leukocytosis.   She was reported to be on vancomycin  for approximately 3 weeks during ED arrival and developed a rash. Antibiotics then transitioned to levofloxacin and ? doxycycline/zosyn   Rashes were noted to be petechial in nature.  She was started on oral prednisone and Benadryl . TTE noted to have rounded echodensity on the ventricular side of the right coronary cusp leaflet of the aortic valve, 6 mm x 8 mm  She has been afebrile here so far off antibiotics since 12/6. Blood cultures done off antibiotics on 12/8 and 12/9 NGTD  She has a sacral decubitus ulcer which has some erythema but does not look overtly infected.   Autoimmune workup unremarkable with negative ANA, c-ANCA, p-ANCA, Sjogren syndrome antibody pending  # Encephalopathy - multifactorial in the setting of polypharmacy, dehydration, possible  sepsis, improving  # Leukocytosis -in the setting of dehydration, possible sepsis due to infected sacral ulcer versus reactive  # Sacral ulcer but no osteomyelitis in CT   # Bilateral LE DVT  - on [facility]  Recommendations - Low concerns for infective endocarditis at this point but would recommend cardiology evaluation for other noninfective causes like Channie Frees, thrombosis, cardiac tumor, etc. She is a high risk TEE candidate and defer need of additional imaging if indicated to Cardiology - Continue to follow-up blood cultures - consult wound care for sacral ulcer  - Universal/standard isolation precautions  Rest of the management as per the primary team. Thank you for the consult. Please page with pertinent questions or concerns.  ______________________________________________________________________ Subjective patient seen and examined at the bedside. 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   Excessive anger    GERD (gastroesophageal reflux disease)    Hallucinations    Hyperlipidemia    Hypertension    Mild mental retardation    Schizophrenia (HCC)    Seizures (HCC)    Vitamin D  deficiency    Past Surgical History:  Procedure Laterality Date   NO PAST SURGERIES     Vitals BP 108/69 (BP Location: Left Arm)   Pulse 84   Temp 98 F (36.7 C)   Resp 18   Ht 5' 2 (1.575 m)   Wt 70.2 kg Comment: last admission  SpO2 99%   BMI 28.31 kg/m     Physical Exam Constitutional: Adult female lying in the bed, nontoxic appearing    Comments: Has a c-collar  Cardiovascular:  Rate and Rhythm: Normal rate      Heart sounds:  Pulmonary:     Effort: Pulmonary effort is normal.     Comments: Normal breath sounds  Abdominal:     Palpations: Abdomen is soft.     Tenderness: Nondistended and nontender  Musculoskeletal:        General: No swelling or tenderness in  peripheral joints  Skin:    Comments: Prior petechial rashes in the lower extremities bilaterally is fading. No peripheral  stigmata of endocarditis   Neurological:     General: Awake, alert and follows commands  Psychiatric:        Mood and Affect: Mood normal.   Pertinent Microbiology Results for orders placed or performed during the hospital encounter of 08/11/24  Blood culture (routine x 2)     Status: None (Preliminary result)   Collection Time: 08/12/24  1:41 AM   Specimen: BLOOD  Result Value Ref Range Status   Specimen Description BLOOD SITE NOT SPECIFIED  Final   Special Requests   Final    BOTTLES DRAWN AEROBIC AND ANAEROBIC Blood Culture results may not be optimal due to an inadequate volume of blood received in culture bottles   Culture   Final    NO GROWTH 3 DAYS Performed at [facility], [address] [place], [place] [address]    Report Status PENDING  Incomplete  Blood culture (routine x 2)     Status: None (Preliminary result)   Collection Time: 08/12/24  2:43 AM   Specimen: BLOOD RIGHT ARM  Result Value Ref Range Status   Specimen Description BLOOD RIGHT ARM  Final   Special Requests   Final    BOTTLES DRAWN AEROBIC AND ANAEROBIC Blood Culture adequate volume   Culture   Final    NO GROWTH 3 DAYS Performed at [facility], [address], [place], [place] [address]    Report Status PENDING  Incomplete  Culture, blood (Routine X 2) w Reflex to ID Panel     Status: None (Preliminary result)   Collection Time: 08/14/24  4:33 AM   Specimen: BLOOD  Result Value Ref Range Status   Specimen Description BLOOD SITE NOT SPECIFIED  Final   Special Requests   Final    BOTTLES DRAWN AEROBIC AND ANAEROBIC Blood Culture adequate volume   Culture   Final    NO GROWTH 1 DAY Performed at [facility], [address], [place], [place] [address]    Report Status PENDING  Incomplete  Culture, blood (Routine X 2) w Reflex to ID Panel     Status: None  (Preliminary result)   Collection Time: 08/14/24  4:35 AM   Specimen: BLOOD  Result Value Ref Range Status   Specimen Description BLOOD SITE NOT SPECIFIED  Final   Special Requests   Final    BOTTLES DRAWN AEROBIC AND ANAEROBIC Blood Culture adequate volume   Culture   Final    NO GROWTH 1 DAY Performed at [facility], [address], [place], [place] [address]    Report Status PENDING  Incomplete  Culture, blood (Routine X 2) w Reflex to ID Panel     Status: None (Preliminary result)   Collection Time: 08/15/24  4:53 AM   Specimen: BLOOD  Result Value Ref Range Status   Specimen Description BLOOD SITE NOT SPECIFIED  Final   Special Requests   Final    BOTTLES DRAWN AEROBIC AND ANAEROBIC  Blood Culture adequate volume   Culture   Final    NO GROWTH < 12 HOURS Performed at [facility], [address], [place], [place] [address]    Report Status PENDING  Incomplete  Culture, blood (Routine X 2) w Reflex to ID Panel     Status: None (Preliminary result)   Collection Time: 08/15/24  4:54 AM   Specimen: BLOOD  Result Value Ref Range Status   Specimen Description BLOOD SITE NOT SPECIFIED  Final   Special Requests   Final    BOTTLES DRAWN AEROBIC AND ANAEROBIC Blood Culture adequate volume   Culture   Final    NO GROWTH < 12 HOURS Performed at [facility], [address], [place], [place] [address]    Report Status PENDING  Incomplete   Pertinent Lab.    Latest Ref Rng & Units 08/15/2024    4:53 AM 08/14/2024    4:33 AM 08/13/2024   11:16 AM  CBC  WBC 4.0 - 10.5 K/uL 14.1  15.2  14.4   Hemoglobin 12.0 - 15.0 g/dL 89.7  9.8  9.6   Hematocrit 36.0 - 46.0 % 31.4  30.3  30.5   Platelets 150 - 400 K/uL 431  382  341       Latest Ref Rng & Units 08/15/2024    4:53 AM 08/14/2024    4:33 AM 08/13/2024   11:16 AM  CMP  Glucose 70 - 99 mg/dL 869  885  823   BUN 6 - 20 mg/dL 6  5  5    Creatinine 0.44 - 1.00 mg/dL 9.55  9.67  9.60   Sodium 135 - 145 mmol/L 138   143  141   Potassium 3.5 - 5.1 mmol/L 4.4  3.9  4.1   Chloride 98 - 111 mmol/L 104  106  105   CO2 22 - 32 mmol/L 17  24  23    Calcium  8.9 - 10.3 mg/dL 8.9  8.9  8.4   Total Protein 6.5 - 8.1 g/dL  5.8    Total Bilirubin 0.0 - 1.2 mg/dL  0.6    Alkaline Phos 38 - 126 U/L  46    AST 15 - 41 U/L  17    ALT 0 - 44 U/L  11       Pertinent Imaging today Plain films and CT images have been personally visualized and interpreted; radiology reports have been reviewed. Decision making incorporated into the Impression /   VAS US  UPPER EXTREMITY VENOUS DUPLEX Result Date: 08/12/2024 UPPER VENOUS STUDY  Patient Name:  Alexandria Harvey  Date of Exam:   08/12/2024 Medical Rec #: [id number]         Accession #:    [id number] Date of Birth: 1981-08-13        Patient Gender: F Patient Age:   28 years Exam Location:  [facility] Procedure:      VAS US  UPPER EXTREMITY VENOUS DUPLEX Referring Phys: COLEN GRIMES --------------------------------------------------------------------------------  Indications: Edema, and recent fall. Limitations: Musculoskeletal features and patient contorted and C-collar. Comparison Study: No prior exam. Performing Technologist: Edilia Elden Appl  Examination Guidelines: A complete evaluation includes B-mode imaging, spectral Doppler, color Doppler, and power Doppler as needed of all accessible portions of each vessel. Bilateral testing is considered an integral part of a complete examination. Limited examinations for reoccurring indications may be performed as noted.  Right Findings: +----------+------------+---------+-----------+----------+--------------+ RIGHT     CompressiblePhasicitySpontaneousProperties   Summary     +----------+------------+---------+-----------+----------+--------------+  IJV                                                 Not visualized +----------+------------+---------+-----------+----------+--------------+ Subclavian                Yes       Yes                             +----------+------------+---------+-----------+----------+--------------+ Axillary      Full       Yes       Yes                             +----------+------------+---------+-----------+----------+--------------+ Brachial      Full       Yes       Yes                             +----------+------------+---------+-----------+----------+--------------+ Radial        Full                                                 +----------+------------+---------+-----------+----------+--------------+ Ulnar         Full                                                 +----------+------------+---------+-----------+----------+--------------+ Cephalic      Full       Yes       Yes                             +----------+------------+---------+-----------+----------+--------------+ Basilic       Full       Yes       Yes                             +----------+------------+---------+-----------+----------+--------------+ Right internal jugular vein not visualized due to C-collar. Limited examination due to patient being contorted and not being able to properly position. Best obtainable images.  Left Findings: +----------+------------+---------+-----------+----------+--------------+ LEFT      CompressiblePhasicitySpontaneousProperties   Summary     +----------+------------+---------+-----------+----------+--------------+ IJV                                                 Not visualized +----------+------------+---------+-----------+----------+--------------+ Subclavian               Yes       Yes                             +----------+------------+---------+-----------+----------+--------------+ Axillary      Full       Yes       Yes                             +----------+------------+---------+-----------+----------+--------------+  Brachial      Full                                                  +----------+------------+---------+-----------+----------+--------------+ Radial        Full                                                 +----------+------------+---------+-----------+----------+--------------+ Ulnar                                               Not visualized +----------+------------+---------+-----------+----------+--------------+ Cephalic      Full       Yes       Yes                             +----------+------------+---------+-----------+----------+--------------+ Basilic       Full       Yes       Yes                             +----------+------------+---------+-----------+----------+--------------+ Left internal jugular vein not visualized due to C-collar and left ulnar vein unaccessible. Limited examination due to patient being contorted and not being able to properly position. Best obtainable images.  Summary:  Right: No evidence of deep vein thrombosis in the upper extremity. No evidence of superficial vein thrombosis in the upper extremity.  Left: No evidence of deep vein thrombosis in the upper extremity. No evidence of superficial vein thrombosis in the upper extremity.  *See table(s) above for measurements and observations.  Diagnosing physician: Debby Robertson Electronically signed by Debby Robertson on 08/12/2024 at 8:12:40 PM.    Final    VAS US  LOWER EXTREMITY VENOUS (DVT) Result Date: 08/12/2024  Lower Venous DVT Study Patient Name:  Alexandria Harvey  Date of Exam:   08/12/2024 Medical Rec #: [id number]         Accession #:    [id number] Date of Birth: 1981/04/14        Patient Gender: F Patient Age:   27 years Exam Location:  [facility] Procedure:      VAS US  LOWER EXTREMITY VENOUS (DVT) Referring Phys: COLEN GRIMES --------------------------------------------------------------------------------  Indications: Swelling, Edema, and recent fall.  Comparison Study: No prior exam. Performing Technologist: Edilia Elden Appl   Examination Guidelines: A complete evaluation includes B-mode imaging, spectral Doppler, color Doppler, and power Doppler as needed of all accessible portions of each vessel. Bilateral testing is considered an integral part of a complete examination. Limited examinations for reoccurring indications may be performed as noted. The reflux portion of the exam is performed with the patient in reverse Trendelenburg.  +---------+---------------+---------+-----------+----------+-------------------+ RIGHT    CompressibilityPhasicitySpontaneityPropertiesThrombus Aging      +---------+---------------+---------+-----------+----------+-------------------+ CFV      Partial        Yes      Yes                                      +---------+---------------+---------+-----------+----------+-------------------+  SFJ      Full           Yes      Yes                                      +---------+---------------+---------+-----------+----------+-------------------+ FV Prox  Partial                                                          +---------+---------------+---------+-----------+----------+-------------------+ FV Mid   None           Yes      Yes                  Not well visualized +---------+---------------+---------+-----------+----------+-------------------+ FV DistalFull           Yes      Yes                                      +---------+---------------+---------+-----------+----------+-------------------+ PFV      Partial        Yes      Yes                                      +---------+---------------+---------+-----------+----------+-------------------+ POP      Full           Yes      Yes                                      +---------+---------------+---------+-----------+----------+-------------------+ PTV                                                   Not well                                                                  visualized,  patent                                                        by color.           +---------+---------------+---------+-----------+----------+-------------------+ PERO                                                  Not well  visualized, patent                                                        by color.           +---------+---------------+---------+-----------+----------+-------------------+ EIV                     Yes      Yes                                      +---------+---------------+---------+-----------+----------+-------------------+ Deep vein thrombosis noted in the right common femoral the proximal and middle femoral veins and the profunda vein.  +---------+---------------+---------+-----------+----------+-------------------+ LEFT     CompressibilityPhasicitySpontaneityPropertiesThrombus Aging      +---------+---------------+---------+-----------+----------+-------------------+ CFV      Partial        Yes      Yes                                      +---------+---------------+---------+-----------+----------+-------------------+ SFJ      Full           Yes      Yes                                      +---------+---------------+---------+-----------+----------+-------------------+ FV Prox  None           No       No                                       +---------+---------------+---------+-----------+----------+-------------------+ FV Mid   None           No       No                                       +---------+---------------+---------+-----------+----------+-------------------+ FV DistalFull           Yes      Yes                                      +---------+---------------+---------+-----------+----------+-------------------+ PFV      Partial        Yes      Yes                                       +---------+---------------+---------+-----------+----------+-------------------+ POP      Full           Yes      Yes                                      +---------+---------------+---------+-----------+----------+-------------------+ PTV  Not well                                                                  visualized, patent                                                        by color.           +---------+---------------+---------+-----------+----------+-------------------+ PERO                                                  Not well                                                                  visualized, patent                                                        by color.           +---------+---------------+---------+-----------+----------+-------------------+ EIV                     Yes      Yes                                      +---------+---------------+---------+-----------+----------+-------------------+ Deep vein thrombosis noted in the left common femoral the proximal and middle femoral veins and the profunda vein.    Summary: RIGHT: - Findings consistent with acute deep vein thrombosis involving the right common femoral vein, right femoral vein, and right proximal profunda vein.  - No cystic structure found in the popliteal fossa.  LEFT: - Findings consistent with acute deep vein thrombosis involving the left common femoral vein, left femoral vein, and left proximal profunda vein.  - No cystic structure found in the popliteal fossa.  *See table(s) above for measurements and observations. Electronically signed by Debby Robertson on 08/12/2024 at 8:12:15 PM.    Final    ECHOCARDIOGRAM COMPLETE Result Date: 08/12/2024    ECHOCARDIOGRAM REPORT   Patient Name:   Alexandria Harvey Date of Exam: 08/12/2024 Medical Rec #:  [id number]        Height:       62.0 in Accession #:    [id number]        Weight:       154.8 lb Date of Birth:  04/13/1981       BSA:          1.714 m Patient Age:    34  years         BP:           117/77 mmHg Patient Gender: F                HR:           78 bpm. Exam Location:  Inpatient Procedure: 2D Echo, Color Doppler, Cardiac Doppler and Intracardiac            Opacification Agent (Both Spectral and Color Flow Doppler were            utilized during procedure). STAT ECHO Indications:    Endocarditis  History:        Patient has no prior history of Echocardiogram examinations.                 Risk Factors:Hypertension, Diabetes and Dyslipidemia. GERD.  Sonographer:    Logan Shove RDCS Referring Phys: [phone] [facility]  Sonographer Comments: Suboptimal parasternal window. Body Position. IMPRESSIONS  1. Left ventricular ejection fraction, by estimation, is 60 to 65%. The left ventricle has normal function. The left ventricle has no regional wall motion abnormalities. Left ventricular diastolic parameters were normal.  2. Right ventricular systolic function is normal. The right ventricular size is normal.  3. The mitral valve is normal in structure. Trivial mitral valve regurgitation. No evidence of mitral stenosis.  4. Rounded echodensity (6 mm X 8 mm) on the ventricular side of (predominantly) the right coronoary cusp leaflet. No abscess is appreciated. The aortic valve is abnormal. Aortic valve regurgitation is not visualized. Comparison(s): No prior Echocardiogram. Conclusion(s)/Recommendation(s): In the clinical picture of bactermia, the study could be consistent with infective endocarditis. Consider TEE if clinically indicated and within goals of care. FINDINGS  Left Ventricle: Left ventricular ejection fraction, by estimation, is 60 to 65%. The left ventricle has normal function. The left ventricle has no regional wall motion abnormalities. Definity  contrast agent was given IV to delineate the left ventricular  endocardial borders. The left ventricular internal cavity  size was normal in size. There is no left ventricular hypertrophy. Left ventricular diastolic parameters were normal. Right Ventricle: The right ventricular size is normal. No increase in right ventricular wall thickness. Right ventricular systolic function is normal. Left Atrium: Left atrial size was normal in size. Right Atrium: Right atrial size was normal in size. Pericardium: There is no evidence of pericardial effusion. Mitral Valve: The mitral valve is normal in structure. Trivial mitral valve regurgitation. No evidence of mitral valve stenosis. Tricuspid Valve: The tricuspid valve is normal in structure. Tricuspid valve regurgitation is not demonstrated. No evidence of tricuspid stenosis. Aortic Valve: Rounded echodensity (6 mm X 8 mm) on the ventricular side of (predominantly) the right coronoary cusp leaflet. No abscess is appreciated. The aortic valve is abnormal. Aortic valve regurgitation is not visualized. Aortic valve peak gradient  measures 10.5 mmHg. Pulmonic Valve: The pulmonic valve was normal in structure. Pulmonic valve regurgitation is trivial. No evidence of pulmonic stenosis. Aorta: The aortic root and ascending aorta are structurally normal, with no evidence of dilitation. IAS/Shunts: No atrial level shunt detected by color flow Doppler.  LEFT VENTRICLE PLAX 2D LVIDd:         4.70 cm      Diastology LVIDs:         3.10 cm      LV e' medial:    9.14 cm/s LV PW:         0.70 cm      LV  E/e' medial:  9.6 LV IVS:        0.70 cm      LV e' lateral:   15.60 cm/s LVOT diam:     2.00 cm      LV E/e' lateral: 5.6 LVOT Area:     3.14 cm  LV Volumes (MOD) LV vol d, MOD A2C: 81.3 ml LV vol d, MOD A4C: 107.0 ml LV vol s, MOD A2C: 33.3 ml LV vol s, MOD A4C: 40.1 ml LV SV MOD A2C:     48.0 ml LV SV MOD A4C:     107.0 ml LV SV MOD BP:      55.8 ml RIGHT VENTRICLE             IVC RV Basal diam:  3.20 cm     IVC diam: 1.40 cm RV S prime:     14.10 cm/s TAPSE (M-mode): 1.8 cm      PULMONARY VEINS                              Diastolic Velocity: 31.20 cm/s                             S/D Velocity:       1.90                             Systolic Velocity:  59.70 cm/s LEFT ATRIUM             Index        RIGHT ATRIUM           Index LA diam:        3.00 cm 1.75 cm/m   RA Area:     10.40 cm LA Vol (A2C):   51.8 ml 30.22 ml/m  RA Volume:   20.10 ml  11.72 ml/m LA Vol (A4C):   31.9 ml 18.61 ml/m LA Biplane Vol: 40.6 ml 23.68 ml/m  AORTIC VALVE AV Area (Vmax): 2.27 cm AV Vmax:        162.00 cm/s AV Peak Grad:   10.5 mmHg LVOT Vmax:      117.00 cm/s  AORTA Ao Root diam: 2.00 cm Ao Asc diam:  2.70 cm MITRAL VALVE MV Area (PHT): 4.21 cm    SHUNTS MV Decel Time: 180 msec    Systemic Diam: 2.00 cm MV E velocity: 87.40 cm/s MV A velocity: 80.30 cm/s MV E/A ratio:  1.09 Stanly Leavens MD Electronically signed by Stanly Leavens MD Signature Date/Time: 08/12/2024/10:18:45 AM    Final    DG Chest Portable 1 View Result Date: 08/12/2024 EXAM: 1 VIEW(S) XRAY OF THE CHEST 08/12/2024 05:55:00 AM COMPARISON: 06/11/2024 CLINICAL HISTORY: sepsis FINDINGS: LINES, TUBES AND DEVICES: Left PICC line in place. LUNGS AND PLEURA: Hypoinflated lungs. Asymmetric elevation of the right hemidiaphragm, unchanged. No pleural effusion. No pneumothorax. HEART AND MEDIASTINUM: No acute abnormality of the cardiac and mediastinal silhouettes. BONES AND SOFT TISSUES: Degenerative changes are identified at the glenohumeral joint. 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 IMPRESSION: 1. Hypoinflated lungs with asymmetric elevation of the right hemidiaphragm, unchanged. 2. Left PICC line in appropriate position. Electronically signed by: Waddell Calk MD 08/12/2024 06:13 AM EST RP Workstation: [id number]   CT ABDOMEN PELVIS W CONTRAST Result Date: 08/12/2024  EXAM: CT ABDOMEN AND PELVIS WITH CONTRAST 08/12/2024 04:11:48 AM TECHNIQUE: CT of the abdomen and pelvis was performed with the  administration of 75 mL of iohexol  (OMNIPAQUE ) 350 MG/ML injection. Multiplanar reformatted images are provided for review. Automated exposure control, iterative reconstruction, and/or weight-based adjustment of the mA/kV was utilized to reduce the radiation dose to as low as reasonably achievable. COMPARISON: Findings are compared to 05/23/2024. CLINICAL HISTORY: Sepsis; sacral wound. FINDINGS: LOWER CHEST: Central venous catheter seen at the superior cavoatrial junction. Moderate multilevel coronary artery calcifications. LIVER: The liver is unremarkable. GALLBLADDER AND BILE DUCTS: Gallbladder is unremarkable. No biliary ductal dilatation. SPLEEN: No acute abnormality. PANCREAS: No acute abnormality. ADRENAL GLANDS: No acute abnormality. KIDNEYS, URETERS AND BLADDER: No stones in the kidneys or ureters. No hydronephrosis. No perinephric or periureteral stranding. Urinary bladder is unremarkable. GI AND BOWEL: Large stool burden without evidence of obstruction. The rectosigmoid colon, however, is relatively decompressed. Appendix normal. The stomach, small bowel, and large bowel are otherwise unremarkable. PERITONEUM AND RETROPERITONEUM: No ascites. No free air. VASCULATURE: Mild aortoiliac atherosclerotic calcification. No aortic aneurysm. LYMPH NODES: No lymphadenopathy. REPRODUCTIVE ORGANS: No acute abnormality. BONES AND SOFT TISSUES: Since the prior examination, a sacral decubitus wound has developed, which approaches the cortex of the subjacent sacrum (75/3) in keeping with a grade 3-4 sacral decubitus wound. No osseous erosion to indicate superimposed osteomyelitis. 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 No acute bone abnormality. IMPRESSION: 1. 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 2. Large stool burden without evidence of obstruction, with relatively decompressed  rectosigmoid colon. Electronically signed by: Dorethia Molt MD 08/12/2024 04:43 AM EST RP Workstation: [id number]   I spent 50 minutes involved in face-to-face and non-face-to-face activities for this patient on the day of the visit. Professional time spent includes the following activities: Preparing to see the patient (review of tests), Obtaining and reviewing separately obtained history (hospitalist progress note, H&P, cardiology consult note), Performing a medically appropriate examination and evaluation,  referring and communicating with other health care professionals, Documenting clinical information in the EMR, Independently interpreting results (not separately reported),  and Care coordination (not separately reported).   Plan d/w requesting provider as well as ID pharm D  Of note, portions of this note may have been created with voice recognition software. While this note has been edited for accuracy, occasional wrong-word or 'sound-a-like' substitutions may have occurred due to the inherent limitations of voice recognition software.   Electronically signed by:   Annalee Orem, MD Infectious Disease Physician [facility] Pager: [phone]

## 2024-08-15 NOTE — Care Management Important Message (Signed)
 Important Message  Patient Details  Name: Alexandria Harvey MRN: [id number] Date of Birth: [birth year]   Important Message Given:  Yes - Medicare IM     Claretta Deed 08/15/2024, 4:06 PM

## 2024-08-16 ENCOUNTER — Ambulatory Visit (HOSPITAL_COMMUNITY): Admission: RE | Admit: 2024-08-16 | Source: Ambulatory Visit

## 2024-08-16 ENCOUNTER — Encounter (HOSPITAL_COMMUNITY): Payer: Self-pay

## 2024-08-16 ENCOUNTER — Ambulatory Visit (HOSPITAL_COMMUNITY)

## 2024-08-16 ENCOUNTER — Other Ambulatory Visit: Payer: Self-pay

## 2024-08-16 DIAGNOSIS — I358 Other nonrheumatic aortic valve disorders: Secondary | ICD-10-CM

## 2024-08-16 DIAGNOSIS — S129XXD Fracture of neck, unspecified, subsequent encounter: Secondary | ICD-10-CM

## 2024-08-16 DIAGNOSIS — A419 Sepsis, unspecified organism: Secondary | ICD-10-CM | POA: Diagnosis not present

## 2024-08-16 LAB — BLOOD CULTURE ID PANEL (REFLEXED) - BCID2

## 2024-08-16 LAB — ANTIPHOSPHOLIPID SYNDROME EVAL, BLD
Anticardiolipin IgA: <9 U/mL (ref 0–11)
Anticardiolipin IgG: <9 GPL U/mL (ref 0–14)
Anticardiolipin IgM: <9 [MPL'U]/mL (ref 0–12)
DRVVT: 47.7 s — ABNORMAL HIGH (ref 0.0–47.0)
PTT Lupus Anticoagulant: 30 s (ref 0.0–43.5)
Phosphatydalserine, IgA: 2 U (ref 0–3)
Phosphatydalserine, IgG: 9 U (ref 0–20)
Phosphatydalserine, IgM: <10 U (ref 0–50)

## 2024-08-16 LAB — DRVVT MIX: dRVVT Mix: 39.3 s (ref 0.0–40.4)

## 2024-08-16 NOTE — Consult Note (Addendum)
 WOC Nurse Consult Note:  WOC consult performed remotely utilizing imaging and chart review  Reason for Consult:sacral ulcer  Wound type: Unstageable pressure injury to sacrum and R buttocks Unstageable pressure injury to posterior head Pressure Injury POA: Yes- sacrum/R buttocks No- posterior head Measurement: see nursing flow sheets Wound bed:  Sacrum/ R buttocks: 20 % red, moist, 80% yellow black slough Posterior head: 100 % yellow slough Drainage (amount, consistency, odor) see nursing flow sheets Periwound: intact Dressing procedure/placement/frequency:   Cleanse wounds with Vashe (lawson # [id number]) allow to air dry. Apply 1/4 thick layer of Santyl  to wound bed, top with saline moist gauze. Top with silicone foam.  Ok to lift foam daily for change of gauze and reapplication of Santyl . Ok to change silicone foam every 3 days.          WOC Nurse team will follow with you and see patient within 10 days for wound assessments.  Please notify WOC nurses of any acute changes in the wounds or any new areas of concern   Thank you,  Doyal Polite, MSN, RN, [facility] WOC Team [phone] (Available Mon-Fri [phone])

## 2024-08-16 NOTE — Plan of Care (Signed)

## 2024-08-16 NOTE — Consult Note (Signed)
 Please note that the [facility] nursing team is utilizing a standardized work plan to manage patient consults. We are triaging consults and will try to see the patients within 24 hours. Wound photos in the patient's chart allow us  to consult on the patient in the most efficient and timely manner.    Thank you,  Doyal Polite, MSN, RN, [facility] WOC Team [phone] (Available Mon-Fri [phone])

## 2024-08-16 NOTE — Progress Notes (Signed)
 Progress Note   Patient: Alexandria Harvey FMW:[id number] DOB: [birth year] DOA: 08/11/2024  DOS: the patient was seen and examined on 08/16/2024   Brief hospital course:  43 year old female- Under guardianship of care for DSS-phone #[phone]--- apparently also has a caregiver [name]/[name] who is the group home, DM, mental retardation, Schizoaffective disorder, previous hallucination, sacral decubitus ulcer, cervical fracture with collar - p/w fever and encephalopathy.   Assessment and Plan:   Concern for sepsis - Encephalopathy, leukocytosis, lactic acidosis, given concern for sepsis.  Etiology unclear possible sacral decubitus ulcer versus PICC line infection.  Blood cultures, IV fluid bolus, empiric antibiotics initiated at presentation.  Monitor blood pressure closely.  Evaluated by infectious disease.  Antibiotics stopped.  Currently on line holiday.  Repeating blood cultures 12/8 & 12/9 NGTD.   Acute metabolic encephalopathy - Polypharmacy, dehydration, possible sepsis, intellectual delay, on presentation.  Medications weaned back.  Appears to be showing improvement.  Patient alert, communicative.  Resides at group home.  Monitor closely.   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  Evaluated by infectious disease.  Only minimal concern for infectious etiology, other differentials include Libman-Sacks, myxoma, clot.  Autoimmune workup so far negative (negative ANA, RA, ANCA).  Negative lupus anticoagulant panel.  Follow-up blood cultures closely.  Cardiology consulted.  Discussed possibility of pursuing TEE however favoring repeat TTE echo in 3 months after she has been on anticoagulation to evaluate need for TEE with C-spine fractures have had more time to heal.  Continue Eliquis  twice daily.  Acute rash - Etiology unclear but thought to be possible drug reaction versus excoriations from bedbugs.  Antibiotics stopped.  Contact precautions.  Will continue monitor closely.   Appears to be improving.  Unstageable sacral ulcer (POA) - Was previously on vancomycin  prior to visit to ED.  Wound evaluated by wound care.  Wound care per their recommendations.   Diabetes mellitus - Insulin  sliding scale on board.   History of cervical fractures with polytrauma - Remains in c-collar.  Follow-up with Dr. Nundkumar-minimize meds as above because of mild encephalopathy.  Reached out to neurosurgery who states it would be reasonable to remove c-collar if TEE is necessary.  Otherwise leave collar in place.   Acute DVT BL LE - Lower extremity Dopplers noting BL proximal femoral vein DVTs.  Continue Eliquis .  Unclear if provoked or unprovoked.  Pending hypercoagulable workup, so far negative.   Severe constipation - Noted incidentally on CT scan.  Bowel regimen on board.  No abdominal pain noted.   Cognitive deficits/schizophrenia - Resides at a group home.  guardianship of care for DSS-phone #[phone]--- apparently also has a caregiver [name]/[name] who is the group home.  Patient evaluated by psychiatry.  Medication recommendations per psych.   Goals of care - Awaiting definitive track of care, infectious versus autoimmune.  Likely patient to return to group home afterwards.  TOC following closely.   Subjective: Patient sitting up in bed, family at bedside.  No acute events overnight.  Denies fever, shortness of breath, nausea, vomiting, pain.  Physical Exam:  Vitals:   08/15/24 2001 08/16/24 0035 08/16/24 0522 08/16/24 0822  BP: 117/74 118/75 122/73 120/79  Pulse: 100 (!) 104 93 (!) 102  Resp: 19 19 18 20   Temp: 98.1 F (36.7 C) 98.8 F (37.1 C) 98.8 F (37.1 C) (!) 97.5 F (36.4 C)  TempSrc: Oral Oral Oral Oral  SpO2: 98% 98% 99% 97%  Weight:  Height:        GENERAL:  Alert, pleasant, no acute distress  HEENT:  EOMI, c-collar CARDIOVASCULAR:  RRR, no murmurs appreciated RESPIRATORY:  Clear to auscultation, no wheezing, rales, or  rhonchi GASTROINTESTINAL:  Soft, nontender, nondistended EXTREMITIES:  No LE edema bilaterally NEURO:  No new focal deficits appreciated SKIN: Sacral wound, improving BL LE rash PSYCH:  Appropriate mood and affect    Data Reviewed:  Imaging Studies: VAS US  UPPER EXTREMITY VENOUS DUPLEX Result Date: 08/12/2024 UPPER VENOUS STUDY  Patient Name:  Alexandria Harvey  Date of Exam:   08/12/2024 Medical Rec #: [id number]         Accession #:    [id number] Date of Birth: 08-11-1981        Patient Gender: F Patient Age:   63 years Exam Location:  [facility] Procedure:      VAS US  UPPER EXTREMITY VENOUS DUPLEX Referring Phys: COLEN GRIMES --------------------------------------------------------------------------------  Indications: Edema, and recent fall. Limitations: Musculoskeletal features and patient contorted and C-collar. Comparison Study: No prior exam. Performing Technologist: Edilia Elden Appl  Examination Guidelines: A complete evaluation includes B-mode imaging, spectral Doppler, color Doppler, and power Doppler as needed of all accessible portions of each vessel. Bilateral testing is considered an integral part of a complete examination. Limited examinations for reoccurring indications may be performed as noted.  Right Findings: +----------+------------+---------+-----------+----------+--------------+ RIGHT     CompressiblePhasicitySpontaneousProperties   Summary     +----------+------------+---------+-----------+----------+--------------+ IJV                                                 Not visualized +----------+------------+---------+-----------+----------+--------------+ Subclavian               Yes       Yes                             +----------+------------+---------+-----------+----------+--------------+ Axillary      Full       Yes       Yes                             +----------+------------+---------+-----------+----------+--------------+  Brachial      Full       Yes       Yes                             +----------+------------+---------+-----------+----------+--------------+ Radial        Full                                                 +----------+------------+---------+-----------+----------+--------------+ Ulnar         Full                                                 +----------+------------+---------+-----------+----------+--------------+ Cephalic      Full       Yes       Yes                             +----------+------------+---------+-----------+----------+--------------+  Basilic       Full       Yes       Yes                             +----------+------------+---------+-----------+----------+--------------+ Right internal jugular vein not visualized due to C-collar. Limited examination due to patient being contorted and not being able to properly position. Best obtainable images.  Left Findings: +----------+------------+---------+-----------+----------+--------------+ LEFT      CompressiblePhasicitySpontaneousProperties   Summary     +----------+------------+---------+-----------+----------+--------------+ IJV                                                 Not visualized +----------+------------+---------+-----------+----------+--------------+ Subclavian               Yes       Yes                             +----------+------------+---------+-----------+----------+--------------+ Axillary      Full       Yes       Yes                             +----------+------------+---------+-----------+----------+--------------+ Brachial      Full                                                 +----------+------------+---------+-----------+----------+--------------+ Radial        Full                                                 +----------+------------+---------+-----------+----------+--------------+ Ulnar                                                Not visualized +----------+------------+---------+-----------+----------+--------------+ Cephalic      Full       Yes       Yes                             +----------+------------+---------+-----------+----------+--------------+ Basilic       Full       Yes       Yes                             +----------+------------+---------+-----------+----------+--------------+ Left internal jugular vein not visualized due to C-collar and left ulnar vein unaccessible. Limited examination due to patient being contorted and not being able to properly position. Best obtainable images.  Summary:  Right: No evidence of deep vein thrombosis in the upper extremity. No evidence of superficial vein thrombosis in the upper extremity.  Left: No evidence of deep vein thrombosis in the upper extremity. No evidence of superficial vein thrombosis in the upper extremity.  *See table(s) above for measurements and  observations.  Diagnosing physician: Debby Robertson Electronically signed by Debby Robertson on 08/12/2024 at 8:12:40 PM.    Final    VAS US  LOWER EXTREMITY VENOUS (DVT) Result Date: 08/12/2024  Lower Venous DVT Study Patient Name:  Alexandria Harvey  Date of Exam:   08/12/2024 Medical Rec #: [id number]         Accession #:    [id number] Date of Birth: 05-05-1981        Patient Gender: F Patient Age:   55 years Exam Location:  [facility] Procedure:      VAS US  LOWER EXTREMITY VENOUS (DVT) Referring Phys: COLEN GRIMES --------------------------------------------------------------------------------  Indications: Swelling, Edema, and recent fall.  Comparison Study: No prior exam. Performing Technologist: Edilia Elden Appl  Examination Guidelines: A complete evaluation includes B-mode imaging, spectral Doppler, color Doppler, and power Doppler as needed of all accessible portions of each vessel. Bilateral testing is considered an integral part of a complete examination. Limited examinations for reoccurring  indications may be performed as noted. The reflux portion of the exam is performed with the patient in reverse Trendelenburg.  +---------+---------------+---------+-----------+----------+-------------------+ RIGHT    CompressibilityPhasicitySpontaneityPropertiesThrombus Aging      +---------+---------------+---------+-----------+----------+-------------------+ CFV      Partial        Yes      Yes                                      +---------+---------------+---------+-----------+----------+-------------------+ SFJ      Full           Yes      Yes                                      +---------+---------------+---------+-----------+----------+-------------------+ FV Prox  Partial                                                          +---------+---------------+---------+-----------+----------+-------------------+ FV Mid   None           Yes      Yes                  Not well visualized +---------+---------------+---------+-----------+----------+-------------------+ FV DistalFull           Yes      Yes                                      +---------+---------------+---------+-----------+----------+-------------------+ PFV      Partial        Yes      Yes                                      +---------+---------------+---------+-----------+----------+-------------------+ POP      Full           Yes      Yes                                      +---------+---------------+---------+-----------+----------+-------------------+  PTV                                                   Not well                                                                  visualized, patent                                                        by color.           +---------+---------------+---------+-----------+----------+-------------------+ PERO                                                  Not well                                                                   visualized, patent                                                        by color.           +---------+---------------+---------+-----------+----------+-------------------+ EIV                     Yes      Yes                                      +---------+---------------+---------+-----------+----------+-------------------+ Deep vein thrombosis noted in the right common femoral the proximal and middle femoral veins and the profunda vein.  +---------+---------------+---------+-----------+----------+-------------------+ LEFT     CompressibilityPhasicitySpontaneityPropertiesThrombus Aging      +---------+---------------+---------+-----------+----------+-------------------+ CFV      Partial        Yes      Yes                                      +---------+---------------+---------+-----------+----------+-------------------+ SFJ      Full           Yes      Yes                                      +---------+---------------+---------+-----------+----------+-------------------+ FV Prox  None           No       No                                       +---------+---------------+---------+-----------+----------+-------------------+ FV Mid   None           No       No                                       +---------+---------------+---------+-----------+----------+-------------------+ FV DistalFull           Yes      Yes                                      +---------+---------------+---------+-----------+----------+-------------------+ PFV      Partial        Yes      Yes                                      +---------+---------------+---------+-----------+----------+-------------------+ POP      Full           Yes      Yes                                      +---------+---------------+---------+-----------+----------+-------------------+ PTV                                                   Not well                                                                   visualized, patent                                                        by color.           +---------+---------------+---------+-----------+----------+-------------------+ PERO                                                  Not well  visualized, patent                                                        by color.           +---------+---------------+---------+-----------+----------+-------------------+ EIV                     Yes      Yes                                      +---------+---------------+---------+-----------+----------+-------------------+ Deep vein thrombosis noted in the left common femoral the proximal and middle femoral veins and the profunda vein.    Summary: RIGHT: - Findings consistent with acute deep vein thrombosis involving the right common femoral vein, right femoral vein, and right proximal profunda vein.  - No cystic structure found in the popliteal fossa.  LEFT: - Findings consistent with acute deep vein thrombosis involving the left common femoral vein, left femoral vein, and left proximal profunda vein.  - No cystic structure found in the popliteal fossa.  *See table(s) above for measurements and observations. Electronically signed by Debby Robertson on 08/12/2024 at 8:12:15 PM.    Final    ECHOCARDIOGRAM COMPLETE Result Date: 08/12/2024    ECHOCARDIOGRAM REPORT   Patient Name:   Alexandria Harvey Date of Exam: 08/12/2024 Medical Rec #:  [id number]        Height:       62.0 in Accession #:    [id number]       Weight:       154.8 lb Date of Birth:  1981-03-10       BSA:          1.714 m Patient Age:    43 years         BP:           117/77 mmHg Patient Gender: F                HR:           78 bpm. Exam Location:  Inpatient Procedure: 2D Echo, Color Doppler, Cardiac Doppler and Intracardiac            Opacification Agent (Both  Spectral and Color Flow Doppler were            utilized during procedure). STAT ECHO Indications:    Endocarditis  History:        Patient has no prior history of Echocardiogram examinations.                 Risk Factors:Hypertension, Diabetes and Dyslipidemia. GERD.  Sonographer:    Logan Shove RDCS Referring Phys: [phone] [facility]  Sonographer Comments: Suboptimal parasternal window. Body Position. IMPRESSIONS  1. Left ventricular ejection fraction, by estimation, is 60 to 65%. The left ventricle has normal function. The left ventricle has no regional wall motion abnormalities. Left ventricular diastolic parameters were normal.  2. Right ventricular systolic function is normal. The right ventricular size is normal.  3. The mitral valve is normal in structure. Trivial mitral valve regurgitation. No evidence of mitral stenosis.  4. Rounded echodensity (6 mm X 8 mm) on the ventricular side of (predominantly) the right coronoary cusp leaflet. No  abscess is appreciated. The aortic valve is abnormal. Aortic valve regurgitation is not visualized. Comparison(s): No prior Echocardiogram. Conclusion(s)/Recommendation(s): In the clinical picture of bactermia, the study could be consistent with infective endocarditis. Consider TEE if clinically indicated and within goals of care. FINDINGS  Left Ventricle: Left ventricular ejection fraction, by estimation, is 60 to 65%. The left ventricle has normal function. The left ventricle has no regional wall motion abnormalities. Definity  contrast agent was given IV to delineate the left ventricular  endocardial borders. The left ventricular internal cavity size was normal in size. There is no left ventricular hypertrophy. Left ventricular diastolic parameters were normal. Right Ventricle: The right ventricular size is normal. No increase in right ventricular wall thickness. Right ventricular systolic function is normal. Left Atrium: Left atrial size was normal in size. Right  Atrium: Right atrial size was normal in size. Pericardium: There is no evidence of pericardial effusion. Mitral Valve: The mitral valve is normal in structure. Trivial mitral valve regurgitation. No evidence of mitral valve stenosis. Tricuspid Valve: The tricuspid valve is normal in structure. Tricuspid valve regurgitation is not demonstrated. No evidence of tricuspid stenosis. Aortic Valve: Rounded echodensity (6 mm X 8 mm) on the ventricular side of (predominantly) the right coronoary cusp leaflet. No abscess is appreciated. The aortic valve is abnormal. Aortic valve regurgitation is not visualized. Aortic valve peak gradient  measures 10.5 mmHg. Pulmonic Valve: The pulmonic valve was normal in structure. Pulmonic valve regurgitation is trivial. No evidence of pulmonic stenosis. Aorta: The aortic root and ascending aorta are structurally normal, with no evidence of dilitation. IAS/Shunts: No atrial level shunt detected by color flow Doppler.  LEFT VENTRICLE PLAX 2D LVIDd:         4.70 cm      Diastology LVIDs:         3.10 cm      LV e' medial:    9.14 cm/s LV PW:         0.70 cm      LV E/e' medial:  9.6 LV IVS:        0.70 cm      LV e' lateral:   15.60 cm/s LVOT diam:     2.00 cm      LV E/e' lateral: 5.6 LVOT Area:     3.14 cm  LV Volumes (MOD) LV vol d, MOD A2C: 81.3 ml LV vol d, MOD A4C: 107.0 ml LV vol s, MOD A2C: 33.3 ml LV vol s, MOD A4C: 40.1 ml LV SV MOD A2C:     48.0 ml LV SV MOD A4C:     107.0 ml LV SV MOD BP:      55.8 ml RIGHT VENTRICLE             IVC RV Basal diam:  3.20 cm     IVC diam: 1.40 cm RV S prime:     14.10 cm/s TAPSE (M-mode): 1.8 cm      PULMONARY VEINS                             Diastolic Velocity: 31.20 cm/s                             S/D Velocity:       1.90  Systolic Velocity:  59.70 cm/s LEFT ATRIUM             Index        RIGHT ATRIUM           Index LA diam:        3.00 cm 1.75 cm/m   RA Area:     10.40 cm LA Vol (A2C):   51.8 ml 30.22 ml/m  RA  Volume:   20.10 ml  11.72 ml/m LA Vol (A4C):   31.9 ml 18.61 ml/m LA Biplane Vol: 40.6 ml 23.68 ml/m  AORTIC VALVE AV Area (Vmax): 2.27 cm AV Vmax:        162.00 cm/s AV Peak Grad:   10.5 mmHg LVOT Vmax:      117.00 cm/s  AORTA Ao Root diam: 2.00 cm Ao Asc diam:  2.70 cm MITRAL VALVE MV Area (PHT): 4.21 cm    SHUNTS MV Decel Time: 180 msec    Systemic Diam: 2.00 cm MV E velocity: 87.40 cm/s MV A velocity: 80.30 cm/s MV E/A ratio:  1.09 Stanly Leavens MD Electronically signed by Stanly Leavens MD Signature Date/Time: 08/12/2024/10:18:45 AM    Final    DG Chest Portable 1 View Result Date: 08/12/2024 EXAM: 1 VIEW(S) XRAY OF THE CHEST 08/12/2024 05:55:00 AM COMPARISON: 06/11/2024 CLINICAL HISTORY: sepsis FINDINGS: LINES, TUBES AND DEVICES: Left PICC line in place. LUNGS AND PLEURA: Hypoinflated lungs. Asymmetric elevation of the right hemidiaphragm, unchanged. No pleural effusion. No pneumothorax. HEART AND MEDIASTINUM: No acute abnormality of the cardiac and mediastinal silhouettes. BONES AND SOFT TISSUES: Degenerative changes are identified at the glenohumeral joint. 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 IMPRESSION: 1. Hypoinflated lungs with asymmetric elevation of the right hemidiaphragm, unchanged. 2. Left PICC line in appropriate position. Electronically signed by: Waddell Calk MD 08/12/2024 06:13 AM EST RP Workstation: [id number]   CT ABDOMEN PELVIS W CONTRAST Result Date: 08/12/2024 EXAM: CT ABDOMEN AND PELVIS WITH CONTRAST 08/12/2024 04:11:48 AM TECHNIQUE: CT of the abdomen and pelvis was performed with the administration of 75 mL of iohexol  (OMNIPAQUE ) 350 MG/ML injection. Multiplanar reformatted images are provided for review. Automated exposure control, iterative reconstruction, and/or weight-based adjustment of the mA/kV was utilized to reduce the radiation dose to as low as reasonably achievable. COMPARISON: Findings are  compared to 05/23/2024. CLINICAL HISTORY: Sepsis; sacral wound. FINDINGS: LOWER CHEST: Central venous catheter seen at the superior cavoatrial junction. Moderate multilevel coronary artery calcifications. LIVER: The liver is unremarkable. GALLBLADDER AND BILE DUCTS: Gallbladder is unremarkable. No biliary ductal dilatation. SPLEEN: No acute abnormality. PANCREAS: No acute abnormality. ADRENAL GLANDS: No acute abnormality. KIDNEYS, URETERS AND BLADDER: No stones in the kidneys or ureters. No hydronephrosis. No perinephric or periureteral stranding. Urinary bladder is unremarkable. GI AND BOWEL: Large stool burden without evidence of obstruction. The rectosigmoid colon, however, is relatively decompressed. Appendix normal. The stomach, small bowel, and large bowel are otherwise unremarkable. PERITONEUM AND RETROPERITONEUM: No ascites. No free air. VASCULATURE: Mild aortoiliac atherosclerotic calcification. No aortic aneurysm. LYMPH NODES: No lymphadenopathy. REPRODUCTIVE ORGANS: No acute abnormality. BONES AND SOFT TISSUES: Since the prior examination, a sacral decubitus wound has developed, which approaches the cortex of the subjacent sacrum (75/3) in keeping with a grade 3-4 sacral decubitus wound. No osseous erosion to indicate superimposed osteomyelitis. 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 No acute bone abnormality. IMPRESSION: 1. Grade 3-4 sacral decubitus wound approaching the cortex of  the subjacent sacrum, without osseous erosion to indicate superimposed osteomyelitis. 2. Large stool burden without evidence of obstruction, with relatively decompressed rectosigmoid colon. Electronically signed by: Dorethia Molt MD 08/12/2024 04:43 AM EST RP Workstation: [id number]    Results are pending, will review when available.  Previous records (including but not limited to H&P, progress notes, nursing notes, TOC management) were reviewed in assessment of this  patient.  Labs: CBC: Recent Labs  Lab 08/12/24 0141 08/13/24 1116 08/14/24 0433 08/15/24 0453  WBC 14.3* 14.4* 15.2* 14.1*  NEUTROABS 6.1  --   --   --   HGB 9.6* 9.6* 9.8* 10.2*  HCT 30.3* 30.5* 30.3* 31.4*  MCV 80.4 80.5 78.7* 80.1  PLT 278 341 382 431*   Basic Metabolic Panel: Recent Labs  Lab 08/12/24 0141 08/13/24 1116 08/14/24 0433 08/15/24 0453  NA 137 141 143 138  K 3.6 4.1 3.9 4.4  CL 100 105 106 104  CO2 22 23 24  17*  GLUCOSE 129* 176* 114* 130*  BUN 10 5* 5* 6  CREATININE 0.46 0.39* 0.32* 0.44  CALCIUM  8.7* 8.4* 8.9 8.9   Liver Function Tests: Recent Labs  Lab 08/12/24 0141 08/14/24 0433  AST 24 17  ALT 15 11  ALKPHOS 47 46  BILITOT <0.2 0.6  PROT 5.5* 5.8*  ALBUMIN 2.0* 2.1*   CBG: No results for input(s): GLUCAP in the last 168 hours.  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

## 2024-08-16 NOTE — Progress Notes (Addendum)
 Progress Note  Patient Name: Alexandria Harvey Date of Encounter: 08/16/2024 [facility] Cardiologist: None   Patient Profile: Alexandria Harvey is a 43 y.o. 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   She was initially admitted for concern for sepsis given her encephalopathy, leukocytosis, and lactic acidosis but ID is less concerned for infection and antibiotics have been discontinued. Also with concern for polypharmacy. 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 ID is not concerned for an infectious etiology given that her blood cultures have been negative. She was also found to have bilateral lower extremity DVTs this admission.   An echocardiogram was obtained for a bacteremia workup which showed normal EF of 60-65% with an aortic valve rounded echodensity (6 mm X 8 mm) on the ventricular side of (predominantly) the right coronoary cusp leaflet.  Interval Summary   No chest pain or shortness of breath this morning. Some pain in her bilateral calves and over her buttocks. No syncopal presyncopal symptoms  Per Dr. Darnella with neurosurgery, would be reasonable to remove the neck brace if she needed a TEE.  Vital Signs Vitals:   08/15/24 1516 08/15/24 2001 08/16/24 0035 08/16/24 0522  BP: 108/69 117/74 118/75 122/73  Pulse: 84 100 (!) 104 93  Resp: 18 19 19 18   Temp: 98 F (36.7 C) 98.1 F (36.7 C) 98.8 F (37.1 C) 98.8 F (37.1 C)  TempSrc:  Oral Oral Oral  SpO2: 99% 98% 98% 99%  Weight:      Height:        Intake/Output Summary (Last 24 hours) at 08/16/2024 0808 Last data filed at 08/15/2024 1827 Gross per 24 hour  Intake 768 ml  Output 800 ml  Net -32 ml      08/12/2024    6:00 AM 06/13/2024    6:17 AM 05/23/2024    9:20 AM  Last 3 Weights  Weight (lbs)  154 lb 12.2 oz 154 lb 12.2 oz 154 lb 1.6 oz  Weight (kg) 70.2 kg 70.2 kg 69.9 kg      Telemetry/ECG  Occasional sinus tachycardia - Personally Reviewed  Physical Exam  GEN: No acute distress. More alert than yesterday. In C collar  Neck: No JVD Cardiac: Systolic murmur. Tachycardia Respiratory: Clear to auscultation bilaterally. GI: Soft, nontender, non-distended  MS: No edema. Tender in the bilateral lower extremities  Assessment & Plan  Aortic valve lesion Patient currently admitted for sepsis rule out, although she has been afebrile off of antibiotics. Found to have echodensity 5mm x 8mm on the ventricular side of the right coronary cusp of the aortic valve without abscess. ID consulted and have low concern for infectious process. Blood cultures x6 have been negative to date. Lactic acid elevated and attributed to metformin  use. CRP 0.8. WBC elevated at 14.1. Does have bilateral DVTs. Differential includes aortic valve sclerosis, infective vs non-infective endocarditis vegetation, cardiac tumor, and thrombus. Systolic murmur favors a degenerative valve. ID does not believe that this is an infective vegetation Concern for hypercoagulable state given DVTs. TEE is high risk in setting of c-spine fractures however neurosurgery feels if TEE is necessary C-collar could be removed given how remote her fractures are at this point. She already requires anticoagulation for her bilateral DVTs which would be the treatment for a  Alexandria Harvey endocarditis however hypercoagulable workup has been negative so far.   -Given this is most likely a degenerative valve and low clinical suspicion for IE per ID, and considering high risk for TEE given cervical spine fracture, recommend deferring TEE and repeat TTE in 3 months to ensure no changes -Follow-up autoimmune workup, so far negative, Sjogrens pending -Sepsis rule out per primary/ID -continue Eliquis  5mg  BID   Per primary/ID/psych DMII Rash Cervical  fractures Constipation Cognitive deficits  [facility] will sign off.   Medication Recommendations: None Other recommendations (labs, testing, etc): Plan repeat transthoracic echocardiogram in 3 months Follow up as an outpatient: Will schedule follow-up    For questions or updates, please contact [facility] Please consult [email or web address] for contact info under    Signed, Melvenia Morrison, MD    Patient seen and examined.  Agree with above documentation.  On exam, patient is alert and oriented, regular rate and rhythm, no murmurs, lungs CTAB, no LE edema or JVD.  I reviewed her echocardiogram and also discussed with our echo lab director.  AV mass appears to be calcification, this is also seen on recent CT chest.  Per ID low clinical suspicion for endocarditis given negative blood cultures and no fevers off abx.  Given she is high risk for TEE with recent cervical spinal fracture, and considering suspect AV mass is degenerative calcifications, would hold off on TEE at this time and plan repeat TTE in 3 months.  Alexandria LITTIE Nanas, MD

## 2024-08-17 ENCOUNTER — Inpatient Hospital Stay (HOSPITAL_COMMUNITY)

## 2024-08-17 DIAGNOSIS — F250 Schizoaffective disorder, bipolar type: Secondary | ICD-10-CM | POA: Diagnosis not present

## 2024-08-17 DIAGNOSIS — L89159 Pressure ulcer of sacral region, unspecified stage: Secondary | ICD-10-CM | POA: Diagnosis not present

## 2024-08-17 DIAGNOSIS — S129XXD Fracture of neck, unspecified, subsequent encounter: Secondary | ICD-10-CM | POA: Diagnosis not present

## 2024-08-17 DIAGNOSIS — G9349 Other encephalopathy: Secondary | ICD-10-CM | POA: Diagnosis not present

## 2024-08-17 DIAGNOSIS — L270 Generalized skin eruption due to drugs and medicaments taken internally: Secondary | ICD-10-CM | POA: Diagnosis not present

## 2024-08-17 DIAGNOSIS — D72829 Elevated white blood cell count, unspecified: Secondary | ICD-10-CM | POA: Diagnosis not present

## 2024-08-17 DIAGNOSIS — S22009D Unspecified fracture of unspecified thoracic vertebra, subsequent encounter for fracture with routine healing: Secondary | ICD-10-CM | POA: Diagnosis not present

## 2024-08-17 DIAGNOSIS — F819 Developmental disorder of scholastic skills, unspecified: Secondary | ICD-10-CM | POA: Diagnosis not present

## 2024-08-17 LAB — CBC
HCT: 30.1 % — ABNORMAL LOW (ref 36.0–46.0)
Hemoglobin: 9.5 g/dL — ABNORMAL LOW (ref 12.0–15.0)
MCH: 25.5 pg — ABNORMAL LOW (ref 26.0–34.0)
MCHC: 31.6 g/dL (ref 30.0–36.0)
MCV: 80.7 fL (ref 80.0–100.0)
Platelets: 477 K/uL — ABNORMAL HIGH (ref 150–400)
RBC: 3.73 MIL/uL — ABNORMAL LOW (ref 3.87–5.11)
RDW: 19.6 % — ABNORMAL HIGH (ref 11.5–15.5)
WBC: 12.5 K/uL — ABNORMAL HIGH (ref 4.0–10.5)
nRBC: 0.4 % — ABNORMAL HIGH (ref 0.0–0.2)

## 2024-08-17 LAB — CULTURE, BLOOD (ROUTINE X 2)
Culture: NO GROWTH
Culture: NO GROWTH
Special Requests: ADEQUATE

## 2024-08-17 LAB — SJOGRENS SYNDROME-A EXTRACTABLE NUCLEAR ANTIBODY: SSA (Ro) (ENA) Antibody, IgG: <0.2 AI (ref 0.0–0.9)

## 2024-08-17 LAB — BASIC METABOLIC PANEL WITH GFR
Anion gap: 8 (ref 5–15)
BUN: <5 mg/dL — ABNORMAL LOW (ref 6–20)
CO2: 25 mmol/L (ref 22–32)
Calcium: 8.4 mg/dL — ABNORMAL LOW (ref 8.9–10.3)
Chloride: 103 mmol/L (ref 98–111)
Creatinine, Ser: <0.3 mg/dL — ABNORMAL LOW (ref 0.44–1.00)
Glucose, Bld: 112 mg/dL — ABNORMAL HIGH (ref 70–99)
Potassium: 4.1 mmol/L (ref 3.5–5.1)
Sodium: 136 mmol/L (ref 135–145)

## 2024-08-17 LAB — CLOZAPINE (CLOZARIL)
Clozapine Lvl: 98 ng/mL — ABNORMAL LOW (ref 350–600)
NorClozapine: 37 ng/mL
Total(Cloz+Norcloz): 135 ng/mL

## 2024-08-17 LAB — SJOGRENS SYNDROME-B EXTRACTABLE NUCLEAR ANTIBODY: SSB (La) (ENA) Antibody, IgG: <0.2 AI (ref 0.0–0.9)

## 2024-08-17 MED ORDER — LORAZEPAM 2 MG/ML IJ SOLN
1.0000 mg | Freq: Once | INTRAMUSCULAR | Status: AC
  Administered 2024-08-17: 1 mg via INTRAVENOUS
  Filled 2024-08-17: qty 1

## 2024-08-17 NOTE — Progress Notes (Addendum)
 [facility] Infectious Diseases Follow Up Note  Patient Identification: Patient Name: Alexandria Harvey MRN: [id number] Admit Date: 08/11/2024 11:43 PM Age: 43 y.o.Today's Date: 08/17/2024  Reason for Visit: Abnormal TTE  Principal Problem:   Petechial rash Active Problems:   Sepsis (HCC)   Aortic valve mass   Multiple fractures of thoracic spine, closed (HCC)   Closed fracture of multiple cervical vertebrae (HCC)   Drug rash   Cognitive developmental delay   Lactic acid acidosis   Schizoaffective disorder (HCC)   Antibiotics: Off antibiotics since 12/6  Lines/Hardwares:   Interval Events: Remains afebrile Labs remarkable for WBC at 12.5, hemoglobin 9.5, platelets 477  Assessment 43 year old female with prior history of mental retardation, seizure affective disorder, DM, cervical fracture with collar, sacral decubitus ulcer, s/p cervical fracture with central cord syndrome, being treated with antibiotics at SNF,  under guardianship who presented to the ED with encephalopathy, rash and leukocytosis.   She was reported to be on vancomycin  for approximately 3 weeks during ED arrival and developed a rash. Antibiotics then transitioned to levofloxacin and ? doxycycline/zosyn   Rashes were noted to be petechial in nature.  She was started on oral prednisone and Benadryl . TTE noted to have rounded echodensity on the ventricular side of the right coronary cusp leaflet of the aortic valve, 6 mm x 8 mm  She has been afebrile here so far off antibiotics since 12/6. Blood cultures done off antibiotics on 12/8 and 12/9 NGTD  She has a sacral decubitus ulcer which has some erythema but does not look overtly infected.   Autoimmune workup unremarkable with negative ANA, c-ANCA, p-ANCA, RF, Sjogren syndrome antibody A and B negative, negative lupus anticoagulant  # Encephalopathy - multifactorial in the setting of polypharmacy, dehydration,  possible sepsis, appears to have improved   # Leukocytosis -in the setting of dehydration, possible sepsis due to infected sacral ulcer versus reactive, improving   # Sacral wound  but no osteomyelitis in CT  # Posterior hear wound - no signs of infection  - WOC following   # Bilateral LE DVT  - on AC  Comments: I discussed TTE findings with Dr. Kate from Cardiology who also reviewed TTE with echo director, rounded echodensity consistent with degenerative valve disease and calcification, unlikely vegetation   12/9 blood cx 1/4 bottles GPC, BCID as staph spp, likely a contaminant   Recommendations - No indication of antibiotics at this time.  She already has received adequate course of treatment for sacral wound/cellulitis prior to arrival and recommend wound care at this time - Fu with cardiology for repeat TTE in 3 months  - Universal/standard isolation precautions ID will sign off.  Recall back with questions or concerns  Rest of the management as per the primary team. Thank you for the consult. Please page with pertinent questions or concerns.  ______________________________________________________________________ Subjective patient was with PT. mother at bedside.  Discussed TTE findings and antibiotic plan.    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   Excessive anger    GERD (gastroesophageal reflux disease)    Hallucinations    Hyperlipidemia    Hypertension    Mild mental retardation    Schizophrenia (HCC)    Seizures (HCC)    Vitamin D  deficiency    Past Surgical History:  Procedure Laterality Date   NO PAST SURGERIES     Vitals BP (!) 115/56 (BP Location: Right Arm)  Pulse 86   Temp 98 F (36.7 C) (Oral)   Resp 18   Ht 5' 2 (1.575 m)   Wt 70.2 kg Comment: last admission  SpO2 97%   BMI 28.31 kg/m     Physical Exam Patient was working with physical  therapy Nontoxic-appearing  Pertinent Microbiology Results for orders placed or performed during the hospital encounter of 08/11/24  Blood culture (routine x 2)     Status: None   Collection Time: 08/12/24  1:41 AM   Specimen: BLOOD  Result Value Ref Range Status   Specimen Description BLOOD SITE NOT SPECIFIED  Final   Special Requests   Final    BOTTLES DRAWN AEROBIC AND ANAEROBIC Blood Culture results may not be optimal due to an inadequate volume of blood received in culture bottles   Culture   Final    NO GROWTH 5 DAYS Performed at [facility], [address], [place], [place] [address]    Report Status 08/17/2024 FINAL  Final  Blood culture (routine x 2)     Status: None   Collection Time: 08/12/24  2:43 AM   Specimen: BLOOD RIGHT ARM  Result Value Ref Range Status   Specimen Description BLOOD RIGHT ARM  Final   Special Requests   Final    BOTTLES DRAWN AEROBIC AND ANAEROBIC Blood Culture adequate volume   Culture   Final    NO GROWTH 5 DAYS Performed at [facility], [address], [place], [place] [address]    Report Status 08/17/2024 FINAL  Final  Culture, blood (Routine X 2) w Reflex to ID Panel     Status: None (Preliminary result)   Collection Time: 08/14/24  4:33 AM   Specimen: BLOOD  Result Value Ref Range Status   Specimen Description BLOOD SITE NOT SPECIFIED  Final   Special Requests   Final    BOTTLES DRAWN AEROBIC AND ANAEROBIC Blood Culture adequate volume   Culture   Final    NO GROWTH 3 DAYS Performed at [facility], [address], [place], [place] [address]    Report Status PENDING  Incomplete  Culture, blood (Routine X 2) w Reflex to ID Panel     Status: None (Preliminary result)   Collection Time: 08/14/24  4:35 AM   Specimen: BLOOD  Result Value Ref Range Status   Specimen Description BLOOD SITE NOT SPECIFIED  Final   Special Requests   Final    BOTTLES DRAWN AEROBIC AND ANAEROBIC Blood Culture adequate volume   Culture    Final    NO GROWTH 3 DAYS Performed at [facility], [address], [place], [place] [address]    Report Status PENDING  Incomplete  Culture, blood (Routine X 2) w Reflex to ID Panel     Status: None (Preliminary result)   Collection Time: 08/15/24  4:53 AM   Specimen: BLOOD  Result Value Ref Range Status   Specimen Description BLOOD SITE NOT SPECIFIED  Final   Special Requests   Final    BOTTLES DRAWN AEROBIC AND ANAEROBIC Blood Culture adequate volume   Culture  Setup Time   Final    GRAM POSITIVE COCCI AEROBIC BOTTLE ONLY CRITICAL RESULT CALLED TO, READ BACK BY AND VERIFIED WITH: PHARMD J LEDFORD 08/16/2024 @ 2302 BY AB Performed at [facility], [address] [place], [place] [address]    Culture GRAM POSITIVE COCCI  Final   Report Status PENDING  Incomplete  Blood Culture ID Panel (Reflexed)     Status: Abnormal   Collection Time: 08/15/24  4:53 AM  Result Value Ref Range Status   Enterococcus faecalis NOT DETECTED NOT DETECTED Final   Enterococcus Faecium NOT DETECTED NOT DETECTED Final   Listeria monocytogenes NOT DETECTED NOT DETECTED Final   Staphylococcus species DETECTED (A) NOT DETECTED Final    Comment: CRITICAL RESULT CALLED TO, READ BACK BY AND VERIFIED WITH: PHARMD J LEDFORD 08/16/2024 @ 2302 BY AB    Staphylococcus aureus (BCID) NOT DETECTED NOT DETECTED Final   Staphylococcus epidermidis NOT DETECTED NOT DETECTED Final   Staphylococcus lugdunensis NOT DETECTED NOT DETECTED Final   Streptococcus species NOT DETECTED NOT DETECTED Final   Streptococcus agalactiae NOT DETECTED NOT DETECTED Final   Streptococcus pneumoniae NOT DETECTED NOT DETECTED Final   Streptococcus pyogenes NOT DETECTED NOT DETECTED Final   A.calcoaceticus-baumannii NOT DETECTED NOT DETECTED Final   Bacteroides fragilis NOT DETECTED NOT DETECTED Final   Enterobacterales NOT DETECTED NOT DETECTED Final   Enterobacter cloacae complex NOT DETECTED NOT DETECTED Final    Escherichia coli NOT DETECTED NOT DETECTED Final   Klebsiella aerogenes NOT DETECTED NOT DETECTED Final   Klebsiella oxytoca NOT DETECTED NOT DETECTED Final   Klebsiella pneumoniae NOT DETECTED NOT DETECTED Final   Proteus species NOT DETECTED NOT DETECTED Final   Salmonella species NOT DETECTED NOT DETECTED Final   Serratia marcescens NOT DETECTED NOT DETECTED Final   Haemophilus influenzae NOT DETECTED NOT DETECTED Final   Neisseria meningitidis NOT DETECTED NOT DETECTED Final   Pseudomonas aeruginosa NOT DETECTED NOT DETECTED Final   Stenotrophomonas maltophilia NOT DETECTED NOT DETECTED Final   Candida albicans NOT DETECTED NOT DETECTED Final   Candida auris NOT DETECTED NOT DETECTED Final   Candida glabrata NOT DETECTED NOT DETECTED Final   Candida krusei NOT DETECTED NOT DETECTED Final   Candida parapsilosis NOT DETECTED NOT DETECTED Final   Candida tropicalis NOT DETECTED NOT DETECTED Final   Cryptococcus neoformans/gattii NOT DETECTED NOT DETECTED Final    Comment: Performed at [facility], [address], [place], [place] [address]  Culture, blood (Routine X 2) w Reflex to ID Panel     Status: None (Preliminary result)   Collection Time: 08/15/24  4:54 AM   Specimen: BLOOD  Result Value Ref Range Status   Specimen Description BLOOD SITE NOT SPECIFIED  Final   Special Requests   Final    BOTTLES DRAWN AEROBIC AND ANAEROBIC Blood Culture adequate volume   Culture   Final    NO GROWTH 2 DAYS Performed at [facility], [address] [place], [place] [address]    Report Status PENDING  Incomplete   Pertinent Lab.    Latest Ref Rng & Units 08/17/2024    4:57 AM 08/15/2024    4:53 AM 08/14/2024    4:33 AM  CBC  WBC 4.0 - 10.5 K/uL 12.5  14.1  15.2   Hemoglobin 12.0 - 15.0 g/dL 9.5  89.7  9.8   Hematocrit 36.0 - 46.0 % 30.1  31.4  30.3   Platelets 150 - 400 K/uL 477  431  382       Latest Ref Rng & Units 08/17/2024    4:57 AM 08/15/2024    4:53 AM 08/14/2024     4:33 AM  CMP  Glucose 70 - 99 mg/dL 887  869  885   BUN 6 - 20 mg/dL <5  6  5  Creatinine 0.44 - 1.00 mg/dL <9.69  9.55  9.67   Sodium 135 - 145 mmol/L 136  138  143   Potassium 3.5 - 5.1 mmol/L 4.1  4.4  3.9   Chloride 98 - 111 mmol/L 103  104  106   CO2 22 - 32 mmol/L 25  17  24    Calcium  8.9 - 10.3 mg/dL 8.4  8.9  8.9   Total Protein 6.5 - 8.1 g/dL   5.8   Total Bilirubin 0.0 - 1.2 mg/dL   0.6   Alkaline Phos 38 - 126 U/L   46   AST 15 - 41 U/L   17   ALT 0 - 44 U/L   11      Pertinent Imaging today Plain films and CT images have been personally visualized and interpreted; radiology reports have been reviewed. Decision making incorporated into the Impression /   No results found.  I spent 50 minutes involved in face-to-face and non-face-to-face activities for this patient on the day of the visit. Professional time spent includes the following activities: Preparing to see the patient (review of tests), Obtaining and reviewing separately obtained history (hospitalist progress note, Cardiology note), Performing a medically appropriate examination and evaluation,  referring and communicating with other health care professionals, Documenting clinical information in the EMR, Discussing results with mother, counseling,  Independently interpreting results (not separately reported),  and Care coordination (not separately reported).   Plan d/w requesting provider as well as ID pharm D  Of note, portions of this note may have been created with voice recognition software. While this note has been edited for accuracy, occasional wrong-word or sound-a-like substitutions may have occurred due to the inherent limitations of voice recognition software.   Electronically signed by:   Annalee Orem, MD Infectious Disease Physician [facility] Pager: [phone]

## 2024-08-17 NOTE — Plan of Care (Signed)

## 2024-08-17 NOTE — Progress Notes (Signed)
 Physical Therapy Treatment Patient Details Name: Alexandria Harvey MRN: [id number] DOB: 07-[name]-1981 Today's Date: 08/17/2024   History of Present Illness 43 y.o. female admitted 08/11/24 for petechial rash suspect adverse drug reaction to Zosyn . 12/6 (+) DVT in BLE. PMHx: intellectual disability, schizophrenia, dropped head syndrome, HTN, seizures, diabetes, hx of C3, C5, C7 fxs with central cord syndrome, hx of T4-T5 fxs, and L foot/ankle fx.    PT Comments  Pt received in supine with parents present and supportive throughout. Pt's parents report pt was ambulatory prior to accident and has been working on BLE and BUE strength with therapy since. Pt appears anxious and fearful of pain throughout session requiring max encouragement for participation. Pt reports improved pain when she initiates the movements and states it wasn't as bad as she thought it would be at the end of the session. Linens noted to be soiled upon entry and pt able to participate in rolling L/R to change them with max encouragement and step by step cues. Pt able to flex B knees ~40 degrees and grip bedrail with R hand during bed mobility. RLE noted to rest into hip external rotation, but pt able to bring back to midline with increased effort and cues. Pt attempts to perform bridges with BLE stabilization with pt able to initiate, but unable to elevate bottom. Pt's mother reports pt was bridging for nursing to place bedpan at SNF. Pt continues to benefit from PT services to progress toward functional mobility goals.    If plan is discharge home, recommend the following: Two people to help with walking and/or transfers;Two people to help with bathing/dressing/bathroom;Assistance with cooking/housework;Assist for transportation;Help with stairs or ramp for entrance;Supervision due to cognitive status;Direct supervision/assist for medications management   Can travel by private vehicle     No  Equipment Recommendations  Hospital  bed;Hoyer lift;Other (comment) (air mattress; tilt-n-space w/c)    Recommendations for Other Services       Precautions / Restrictions Precautions Precautions: Cervical;Fall Precaution Booklet Issued: No Recall of Precautions/Restrictions: Impaired Required Braces or Orthoses: Cervical Brace Cervical Brace: Hard collar;At all times Restrictions Weight Bearing Restrictions Per Provider Order: No     Mobility  Bed Mobility Overal bed mobility: Needs Assistance Bed Mobility: Rolling Rolling: Max assist, +2 for physical assistance         General bed mobility comments: Pt rolled L/R x2 for linen change. Pt requires assist to flex each knee and reach for rail. Pt able to grip rail with RUE, but not LUE. Pt declined EOB    Transfers                        Ambulation/Gait                   Stairs             Wheelchair Mobility     Tilt Bed    Modified Rankin (Stroke Patients Only)       Balance Overall balance assessment: Needs assistance     Sitting balance - Comments: nt                                    Communication Communication Communication: Impaired Factors Affecting Communication: Reduced clarity of speech  Cognition Arousal: Alert Behavior During Therapy: Flat affect, Lability, Anxious   PT - Cognitive impairments: History of cognitive impairments, Problem solving,  Attention                       PT - Cognition Comments: Improved anxiety with increased time, encouragement, and with pt initiating movements. Suspect pt with a fear of pain Following commands: Impaired Following commands impaired: Follows one step commands inconsistently, Follows one step commands with increased time    Cueing Cueing Techniques: Verbal cues, Gestural cues, Tactile cues  Exercises General Exercises - Lower Extremity Heel Slides: AAROM, Supine, Both, 5 reps Other Exercises Other Exercises: supine bridges with BLE  stabilization x5. Pt able to initiate, but unable to elevate bottom    General Comments        Pertinent Vitals/Pain Pain Assessment Pain Assessment: Faces Faces Pain Scale: Hurts even more Pain Location: generalized (LLE>RLE) Pain Descriptors / Indicators: Grimacing, Moaning, Crying, Guarding Pain Intervention(s): Limited activity within patient's tolerance, Monitored during session, Premedicated before session, Repositioned     PT Goals (current goals can now be found in the care plan section) Acute Rehab PT Goals PT Goal Formulation: Patient unable to participate in goal setting Time For Goal Achievement: 08/27/24 Progress towards PT goals: Progressing toward goals    Frequency    Min 1X/week       AM-PAC PT 6 Clicks Mobility   Outcome Measure  Help needed turning from your back to your side while in a flat bed without using bedrails?: Total Help needed moving from lying on your back to sitting on the side of a flat bed without using bedrails?: Total Help needed moving to and from a bed to a chair (including a wheelchair)?: Total Help needed standing up from a chair using your arms (e.g., wheelchair or bedside chair)?: Total Help needed to walk in hospital room?: Total Help needed climbing 3-5 steps with a railing? : Total 6 Click Score: 6    End of Session   Activity Tolerance: Patient limited by pain;Patient tolerated treatment well Patient left: in bed;with call bell/phone within reach;with family/visitor present Nurse Communication: Mobility status PT Visit Diagnosis: Pain;Other symptoms and signs involving the nervous system (R29.898);Muscle weakness (generalized) (M62.81);Other abnormalities of gait and mobility (R26.89)     Time: 8976-8944 PT Time Calculation (min) (ACUTE ONLY): 32 min  Charges:    $Therapeutic Exercise: 8-22 mins $Therapeutic Activity: 8-22 mins PT General Charges $$ ACUTE PT VISIT: 1 Visit                    Darryle George,  PTA Acute Rehabilitation Services Secure Chat Preferred  [phone]    Darryle George 08/17/2024, 12:28 PM

## 2024-08-17 NOTE — Progress Notes (Signed)
 PHARMACY - PHYSICIAN COMMUNICATION CRITICAL VALUE ALERT - BLOOD CULTURE IDENTIFICATION (BCID)  Alexandria Harvey is an 43 y.o. female who presented to [facility] on 08/11/2024 with a chief complaint of aortic valve lesion, low concern for infectious process per ID, one bottle of blood with staph species   Name of physician (or Provider) Contacted: Dr. Marcene  Current antibiotics: None  Changes to prescribed antibiotics recommended:  Cont to monitor off anti-biotics   Results for orders placed or performed during the hospital encounter of 08/11/24  Blood Culture ID Panel (Reflexed) (Collected: 08/15/2024  4:53 AM)  Result Value Ref Range   Enterococcus faecalis NOT DETECTED NOT DETECTED   Enterococcus Faecium NOT DETECTED NOT DETECTED   Listeria monocytogenes NOT DETECTED NOT DETECTED   Staphylococcus species DETECTED (A) NOT DETECTED   Staphylococcus aureus (BCID) NOT DETECTED NOT DETECTED   Staphylococcus epidermidis NOT DETECTED NOT DETECTED   Staphylococcus lugdunensis NOT DETECTED NOT DETECTED   Streptococcus species NOT DETECTED NOT DETECTED   Streptococcus agalactiae NOT DETECTED NOT DETECTED   Streptococcus pneumoniae NOT DETECTED NOT DETECTED   Streptococcus pyogenes NOT DETECTED NOT DETECTED   A.calcoaceticus-baumannii NOT DETECTED NOT DETECTED   Bacteroides fragilis NOT DETECTED NOT DETECTED   Enterobacterales NOT DETECTED NOT DETECTED   Enterobacter cloacae complex NOT DETECTED NOT DETECTED   Escherichia coli NOT DETECTED NOT DETECTED   Klebsiella aerogenes NOT DETECTED NOT DETECTED   Klebsiella oxytoca NOT DETECTED NOT DETECTED   Klebsiella pneumoniae NOT DETECTED NOT DETECTED   Proteus species NOT DETECTED NOT DETECTED   Salmonella species NOT DETECTED NOT DETECTED   Serratia marcescens NOT DETECTED NOT DETECTED   Haemophilus influenzae NOT DETECTED NOT DETECTED   Neisseria meningitidis NOT DETECTED NOT DETECTED   Pseudomonas aeruginosa NOT DETECTED NOT DETECTED    Stenotrophomonas maltophilia NOT DETECTED NOT DETECTED   Candida albicans NOT DETECTED NOT DETECTED   Candida auris NOT DETECTED NOT DETECTED   Candida glabrata NOT DETECTED NOT DETECTED   Candida krusei NOT DETECTED NOT DETECTED   Candida parapsilosis NOT DETECTED NOT DETECTED   Candida tropicalis NOT DETECTED NOT DETECTED   Cryptococcus neoformans/gattii NOT DETECTED NOT DETECTED    Clair Agent 08/17/2024  3:44 AM

## 2024-08-17 NOTE — Progress Notes (Signed)
 Progress Note   Patient: Alexandria Harvey FMW:[id number] DOB: April 19, 1981 DOA: 08/11/2024     5 DOS: the patient was seen and examined on 08/17/2024   Brief hospital course: 43 year old female- Under guardianship of care for DSS-phone #[phone]--- apparently also has a caregiver [name]/[name] who is the group home, DM, mental retardation, Schizoaffective disorder, previous hallucination, sacral decubitus ulcer, cervical fracture with collar - p/w fever and encephalopathy.   Assessment and Plan: Concern for sepsis Presented with leukocytosis, lactic acidosis, concern for sepsis.  Etiology unclear possible sacral decubitus ulcer versus PICC line infection.   Got empiric antibiotics initiated at presentation.  Monitor blood pressure closely.  Evaluated by ID, Antibiotics stopped.  Currently on line holiday.   Repeat blood cultures 12/8 & 12/9 NGTD.   Acute metabolic encephalopathy Possible polypharmacy, dehydration, ? sepsis, in the setting of intellectual delay. Medications weaned back.  Mental status back to baseline. She resides at group home.   Delirium precautions.   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   Evaluated by infectious disease.  Only minimal concern for infectious etiology, other differentials include Libman-Sacks, myxoma, clot.   Autoimmune workup so far negative (negative ANA, RA, ANCA).  Negative lupus anticoagulant panel.   Follow-up blood cultures closely.  Cardiology team favoring repeat TTE echo in 3 months after she has been on anticoagulation to evaluate need for TEE Continue eliquis  therapy.   Acute rash Unclear etiology, thought to be possible drug reaction versus excoriations from bedbugs.  Antibiotics stopped.  Contact precautions.  Continue monitor closely.     Unstageable sacral ulcer (POA) Was previously on vancomycin  prior to visit to ED.  Wound evaluated by wound care.  Wound care per their recommendations.   Diabetes  mellitus Continue accucheks, Insulin  sliding scale per floor protocol.   History of cervical fractures with polytrauma Remains in c-collar.  Reached out to neurosurgery who advised repeat MRI C/T spine with and without contrast to evaluate for need for CTO collar.   Acute DVT BL LE Lower extremity Dopplers noting BL proximal femoral vein DVTs.  Continue Eliquis .  Unclear if provoked or unprovoked.  Hypercoagulable workup, so far negative.   Severe constipation Noted incidentally on CT scan.  Continue bowel regimen. Encourage out of bed to chair.   Cognitive deficits/schizophrenia Resides at a group home.  guardianship of care for DSS-phone #[phone]--- apparently also has a caregiver [name]/[name] who is the group home.   Patient evaluated by psychiatry.  Medication recommendations per psych.   Goals of care Awaiting definitive track of care, infectious versus autoimmune.  Likely patient to return to group home afterwards.  TOC following closely.       Out of bed to chair. Incentive spirometry. Nursing supportive care. Fall, aspiration precautions. Diet:  Diet Orders (From admission, onward)     Start     Ordered   08/15/24 1735  Diet regular Room service appropriate? Yes; Fluid consistency: Thin  Diet effective now       Comments: Patient has allergies to fish.  She does not like chicken or salad.  Question Answer Comment  Room service appropriate? Yes   Fluid consistency: Thin      08/15/24 1736           DVT prophylaxis: SCDs Start: 08/12/24 9360 apixaban  (ELIQUIS ) tablet 5 mg  Level of care: Telemetry   Code Status: Full Code  Subjective: Patient is seen and examined today morning. She is more lethargic, parents at  bedside asked about her neck collar. Has right leg pain, worked with PT today. Eating fair.  Physical Exam: Vitals:   08/16/24 2004 08/17/24 0037 08/17/24 0409 08/17/24 0755  BP: 124/76 116/73 114/69 (!) 115/56  Pulse: 97 89 86   Resp: 19 19 19  18   Temp: 99.4 F (37.4 C) 98.8 F (37.1 C) 98.1 F (36.7 C) 98 F (36.7 C)  TempSrc: Oral Oral Oral Oral  SpO2: 97% 94% 92% 97%  Weight:      Height:        General -Young  obese Caucasian female, no apparent distress HEENT - PERRLA, EOMI, atraumatic head, neck collar noted. Lung - Clear, basal rales, rhonchi, wheezes. Heart - S1, S2 heard, no murmurs, rubs, trace pedal edema. Abdomen - Soft, non tender, bowel sounds good Neuro - Alert, awake and oriented, non focal exam. Skin - Warm and dry.  Data Reviewed:      Latest Ref Rng & Units 08/17/2024    4:57 AM 08/15/2024    4:53 AM 08/14/2024    4:33 AM  CBC  WBC 4.0 - 10.5 K/uL 12.5  14.1  15.2   Hemoglobin 12.0 - 15.0 g/dL 9.5  89.7  9.8   Hematocrit 36.0 - 46.0 % 30.1  31.4  30.3   Platelets 150 - 400 K/uL 477  431  382       Latest Ref Rng & Units 08/17/2024    4:57 AM 08/15/2024    4:53 AM 08/14/2024    4:33 AM  BMP  Glucose 70 - 99 mg/dL 887  869  885   BUN 6 - 20 mg/dL 5  6  5    Creatinine 0.44 - 1.00 mg/dL <9.69  9.55  9.67   Sodium 135 - 145 mmol/L 136  138  143   Potassium 3.5 - 5.1 mmol/L 4.1  4.4  3.9   Chloride 98 - 111 mmol/L 103  104  106   CO2 22 - 32 mmol/L 25  17  24    Calcium  8.9 - 10.3 mg/dL 8.4  8.9  8.9    No results found.  Family Communication: Discussed with patient, parents at bedside. They understand and agree. All questions answered.  Disposition: Status is: Inpatient Remains inpatient appropriate because: follow cultures, neurosurgery, wound care, placement.  Planned Discharge Destination: group home     Time spent: 44 minutes  Author: Concepcion Riser, MD 08/17/2024 12:36 PM Secure chat 7am to 7pm For on call review [email or web address].

## 2024-08-17 NOTE — Plan of Care (Signed)

## 2024-08-18 LAB — CULTURE, BLOOD (ROUTINE X 2)
Culture  Setup Time: NO GROWTH
Special Requests: ADEQUATE

## 2024-08-18 LAB — CBC
HCT: 31.4 % — ABNORMAL LOW (ref 36.0–46.0)
Hemoglobin: 10 g/dL — ABNORMAL LOW (ref 12.0–15.0)
MCH: 25.9 pg — ABNORMAL LOW (ref 26.0–34.0)
MCHC: 31.8 g/dL (ref 30.0–36.0)
MCV: 81.3 fL (ref 80.0–100.0)
Platelets: 463 K/uL — ABNORMAL HIGH (ref 150–400)
RBC: 3.86 MIL/uL — ABNORMAL LOW (ref 3.87–5.11)
RDW: 19.8 % — ABNORMAL HIGH (ref 11.5–15.5)
WBC: 13.2 K/uL — ABNORMAL HIGH (ref 4.0–10.5)
nRBC: 0.2 % (ref 0.0–0.2)

## 2024-08-18 LAB — GLUCOSE, CAPILLARY: Glucose-Capillary: 206 mg/dL — ABNORMAL HIGH (ref 70–99)

## 2024-08-18 MED ORDER — INSULIN ASPART 100 UNIT/ML IJ SOLN: 0.0000 [IU] | Freq: Three times a day (TID) | INTRAMUSCULAR | Status: DC

## 2024-08-18 NOTE — Progress Notes (Addendum)
 PROGRESS NOTE    Alexandria Harvey  FMW:[id number] DOB: 1981/06/25 DOA: 08/11/2024 PCP: [facility]   Brief Narrative:  43 year old female- Under guardianship of care for DSS-phone #[phone]--- apparently also has a caregiver [name]/[name] who is the group home, DM, mental retardation, Schizoaffective disorder, previous hallucination, sacral decubitus ulcer, cervical fracture with collar - p/w fever and encephalopathy.    Assessment & Plan:  Principal Problem:   Petechial rash Active Problems:   Sepsis (HCC)   Aortic valve mass   Multiple fractures of thoracic spine, closed (HCC)   Closed fracture of multiple cervical vertebrae (HCC)   Drug rash   Cognitive developmental delay   Lactic acid acidosis   Schizoaffective disorder (HCC)    Concern for sepsis, resolved now Presented with leukocytosis, lactic acidosis, concern for sepsis.  She has been afebrile here so far off antibiotics since 12/6. Blood cultures done off antibiotics on 12/8 and 12/9 NGTD  Got empiric antibiotics initiated at presentation.  Monitor blood pressure closely.  Evaluated by ID, Antibiotics stopped.  Currently on line holiday.   Autoimmune workup unremarkable with negative ANA, c-ANCA, p-ANCA, RF, Sjogren syndrome antibody A and B negative, negative lupus anticoagulant     Acute metabolic encephalopathy Possible polypharmacy, dehydration, ? sepsis, in the setting of intellectual delay. Medications weaned back.  Mental status back to baseline. She resides at group home.   Delirium precautions.   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   Evaluated by infectious disease.  Only minimal concern for infectious etiology, other differentials include Libman-Sacks, myxoma, clot.   Autoimmune workup so far negative (negative ANA, RA, ANCA).  Negative lupus anticoagulant panel.   Follow-up blood cultures closely.  Cardiology team favoring repeat TTE echo in 3 months  after she has been on anticoagulation to evaluate need for TEE Continue eliquis  therapy.   Acute rash Unclear etiology, thought to be possible drug reaction versus excoriations from bedbugs.  Antibiotics stopped.  Contact precautions.  Continue monitor closely.     Unstageable sacral ulcer (POA) Was previously on vancomycin  prior to visit to ED.  Wound evaluated by wound care.  Wound care per their recommendations.   Diabetes mellitus type 2 Continue accucheks, Insulin  sliding scale per floor protocol.   History of cervical fractures with polytrauma Remains in c-collar.  Reached out to neurosurgery who advised repeat MRI C/T spine with and without contrast to evaluate for need for CTO collar.    Acute DVT BL LE Lower extremity Dopplers noting BL proximal femoral vein DVTs.  Continue Eliquis .  Unclear if provoked or unprovoked.  Hypercoagulable workup, so far negative.   Severe constipation Noted incidentally on CT scan.  Continue bowel regimen. Encourage out of bed to chair.   Cognitive deficits/schizophrenia Resides at a group home.  guardianship of care for DSS-phone #[phone]--- apparently also has a caregiver [name]/[name] who is the group home.   Patient evaluated by psychiatry.  Medication recommendations per psych.  Disposition: She is from [facility]. Guardian wants a different facility on discharge. This has been communicated to the child psychotherapist.    DVT prophylaxis: SCDs Start: 08/12/24 9360 apixaban  (ELIQUIS ) tablet 5 mg     Code Status: Full Code Family Communication:  Sherlynn Garre ([id number]) at the bedside Status is: Inpatient Remains inpatient appropriate because: Pending placement    Subjective:  She appears drowsy. C-collar is in place. Spoke to the guardian at the bedside about MRI results.  Examination:  General exam: Appears drowsy, unable to communicate effectively, C-collar in place, scar on the right side of the forehead noted Respiratory system:  Clear to auscultation. Respiratory effort normal. Cardiovascular system: S1 & S2 heard, RRR. No JVD, murmurs, rubs, gallops or clicks. No pedal edema. Gastrointestinal system: Abdomen is nondistended, soft and nontender. No organomegaly or masses felt. Normal bowel sounds heard. Central nervous system: Appears drowsy Extremities: No acute findings   Wound 06/11/24 Pressure Injury Sacrum Mid Unstageable - Full thickness tissue loss in which the base of the injury is covered by slough (yellow, tan, gray, green or brown) and/or eschar (tan, brown or black) in the wound bed. (Active)     Wound 08/16/24 0259 Pressure Injury Head Posterior Unstageable - Full thickness tissue loss in which the base of the injury is covered by slough (yellow, tan, gray, green or brown) and/or eschar (tan, brown or black) in the wound bed. (Active)     Diet Orders (From admission, onward)     Start     Ordered   08/15/24 1735  Diet regular Room service appropriate? Yes; Fluid consistency: Thin  Diet effective now       Comments: Patient has allergies to fish.  She does not like chicken or salad.  Question Answer Comment  Room service appropriate? Yes   Fluid consistency: Thin      08/15/24 1736            Objective: Vitals:   08/17/24 2046 08/18/24 0051 08/18/24 0416 08/18/24 0736  BP: 122/73 107/70 129/84 113/68  Pulse: 82 84 92 81  Resp:    16  Temp: 98.2 F (36.8 C) 98.5 F (36.9 C) 98.9 F (37.2 C) 97.6 F (36.4 C)  TempSrc:      SpO2: 98% 95% 95% 98%  Weight:      Height:        Intake/Output Summary (Last 24 hours) at 08/18/2024 1132 Last data filed at 08/18/2024 1000 Gross per 24 hour  Intake 690 ml  Output 800 ml  Net -110 ml   Filed Weights   08/12/24 0600  Weight: 70.2 kg    Scheduled Meds:  apixaban   5 mg Oral BID   cloZAPine   50 mg Oral BID   collagenase    Topical Daily   diphenhydrAMINE -zinc  acetate   Topical BID   divalproex   250 mg Oral TID   gabapentin   100 mg  Oral TID   polyethylene glycol  17 g Oral BID   senna-docusate  1 tablet Oral Daily   Continuous Infusions:  Nutritional status     Body mass index is 28.31 kg/m.  Data Reviewed:   CBC: Recent Labs  Lab 08/12/24 0141 08/13/24 1116 08/14/24 0433 08/15/24 0453 08/17/24 0457 08/18/24 0535  WBC 14.3* 14.4* 15.2* 14.1* 12.5* 13.2*  NEUTROABS 6.1  --   --   --   --   --   HGB 9.6* 9.6* 9.8* 10.2* 9.5* 10.0*  HCT 30.3* 30.5* 30.3* 31.4* 30.1* 31.4*  MCV 80.4 80.5 78.7* 80.1 80.7 81.3  PLT 278 341 382 431* 477* 463*   Basic Metabolic Panel: Recent Labs  Lab 08/12/24 0141 08/13/24 1116 08/14/24 0433 08/15/24 0453 08/17/24 0457  NA 137 141 143 138 136  K 3.6 4.1 3.9 4.4 4.1  CL 100 105 106 104 103  CO2 22 23 24  17* 25  GLUCOSE 129* 176* 114* 130* 112*  BUN 10 5* 5* 6 <5*  CREATININE 0.46 0.39* 0.32* 0.44 <0.30*  CALCIUM  8.7* 8.4* 8.9 8.9 8.4*   GFR: CrCl cannot be calculated (This lab value cannot be used to calculate CrCl because it is not a number: <0.30). Liver Function Tests: Recent Labs  Lab 08/12/24 0141 08/14/24 0433  AST 24 17  ALT 15 11  ALKPHOS 47 46  BILITOT <0.2 0.6  PROT 5.5* 5.8*  ALBUMIN 2.0* 2.1*   No results for input(s): LIPASE, AMYLASE in the last 168 hours. No results for input(s): AMMONIA in the last 168 hours. Coagulation Profile: Recent Labs  Lab 08/12/24 0141 08/13/24 1116  INR 1.4* 1.1   Cardiac Enzymes: No results for input(s): CKTOTAL, CKMB, CKMBINDEX, TROPONINI in the last 168 hours. BNP (last 3 results) No results for input(s): PROBNP in the last 8760 hours. HbA1C: No results for input(s): HGBA1C in the last 72 hours. CBG: No results for input(s): GLUCAP in the last 168 hours. Lipid Profile: No results for input(s): CHOL, HDL, LDLCALC, TRIG, CHOLHDL, LDLDIRECT in the last 72 hours. Thyroid  Function Tests: No results for input(s): TSH, T4TOTAL, FREET4, T3FREE, THYROIDAB in the  last 72 hours. Anemia Panel: No results for input(s): VITAMINB12, FOLATE, FERRITIN, TIBC, IRON, RETICCTPCT in the last 72 hours. Sepsis Labs: Recent Labs  Lab 08/12/24 0212 08/12/24 0458 08/12/24 0648 08/12/24 1216  LATICACIDVEN 5.1* 5.6* 4.1* 3.8*    Recent Results (from the past 240 hours)  Blood culture (routine x 2)     Status: None   Collection Time: 08/12/24  1:41 AM   Specimen: BLOOD  Result Value Ref Range Status   Specimen Description BLOOD SITE NOT SPECIFIED  Final   Special Requests   Final    BOTTLES DRAWN AEROBIC AND ANAEROBIC Blood Culture results may not be optimal due to an inadequate volume of blood received in culture bottles   Culture   Final    NO GROWTH 5 DAYS Performed at [facility], [address] [place], [place] [address]    Report Status 08/17/2024 FINAL  Final  Blood culture (routine x 2)     Status: None   Collection Time: 08/12/24  2:43 AM   Specimen: BLOOD RIGHT ARM  Result Value Ref Range Status   Specimen Description BLOOD RIGHT ARM  Final   Special Requests   Final    BOTTLES DRAWN AEROBIC AND ANAEROBIC Blood Culture adequate volume   Culture   Final    NO GROWTH 5 DAYS Performed at [facility], [address], [place], [place] [address]    Report Status 08/17/2024 FINAL  Final  Culture, blood (Routine X 2) w Reflex to ID Panel     Status: None (Preliminary result)   Collection Time: 08/14/24  4:33 AM   Specimen: BLOOD  Result Value Ref Range Status   Specimen Description BLOOD SITE NOT SPECIFIED  Final   Special Requests   Final    BOTTLES DRAWN AEROBIC AND ANAEROBIC Blood Culture adequate volume   Culture   Final    NO GROWTH 4 DAYS Performed at [facility], [address] [place], [place] [address]    Report Status PENDING  Incomplete  Culture, blood (Routine X 2) w Reflex to ID Panel     Status: None (Preliminary result)   Collection Time: 08/14/24  4:35 AM   Specimen: BLOOD  Result Value Ref  Range Status   Specimen Description BLOOD SITE NOT SPECIFIED  Final   Special Requests   Final    BOTTLES DRAWN AEROBIC  AND ANAEROBIC Blood Culture adequate volume   Culture   Final    NO GROWTH 4 DAYS Performed at [facility], [address] [place], [place] [address]    Report Status PENDING  Incomplete  Culture, blood (Routine X 2) w Reflex to ID Panel     Status: Abnormal   Collection Time: 08/15/24  4:53 AM   Specimen: BLOOD  Result Value Ref Range Status   Specimen Description BLOOD SITE NOT SPECIFIED  Final   Special Requests   Final    BOTTLES DRAWN AEROBIC AND ANAEROBIC Blood Culture adequate volume   Culture  Setup Time   Final    GRAM POSITIVE COCCI AEROBIC BOTTLE ONLY CRITICAL RESULT CALLED TO, READ BACK BY AND VERIFIED WITH: PHARMD J LEDFORD 08/16/2024 @ 2302 BY AB    Culture (A)  Final    STAPHYLOCOCCUS CAPITIS THE SIGNIFICANCE OF ISOLATING THIS ORGANISM FROM A SINGLE SET OF BLOOD CULTURES WHEN MULTIPLE SETS ARE DRAWN IS UNCERTAIN. PLEASE NOTIFY THE MICROBIOLOGY DEPARTMENT WITHIN ONE WEEK IF SPECIATION AND SENSITIVITIES ARE REQUIRED. Performed at [facility], [address], [place], [place] [address]    Report Status 08/18/2024 FINAL  Final  Blood Culture ID Panel (Reflexed)     Status: Abnormal   Collection Time: 08/15/24  4:53 AM  Result Value Ref Range Status   Enterococcus faecalis NOT DETECTED NOT DETECTED Final   Enterococcus Faecium NOT DETECTED NOT DETECTED Final   Listeria monocytogenes NOT DETECTED NOT DETECTED Final   Staphylococcus species DETECTED (A) NOT DETECTED Final    Comment: CRITICAL RESULT CALLED TO, READ BACK BY AND VERIFIED WITH: PHARMD J LEDFORD 08/16/2024 @ 2302 BY AB    Staphylococcus aureus (BCID) NOT DETECTED NOT DETECTED Final   Staphylococcus epidermidis NOT DETECTED NOT DETECTED Final   Staphylococcus lugdunensis NOT DETECTED NOT DETECTED Final   Streptococcus species NOT DETECTED NOT DETECTED Final   Streptococcus  agalactiae NOT DETECTED NOT DETECTED Final   Streptococcus pneumoniae NOT DETECTED NOT DETECTED Final   Streptococcus pyogenes NOT DETECTED NOT DETECTED Final   A.calcoaceticus-baumannii NOT DETECTED NOT DETECTED Final   Bacteroides fragilis NOT DETECTED NOT DETECTED Final   Enterobacterales NOT DETECTED NOT DETECTED Final   Enterobacter cloacae complex NOT DETECTED NOT DETECTED Final   Escherichia coli NOT DETECTED NOT DETECTED Final   Klebsiella aerogenes NOT DETECTED NOT DETECTED Final   Klebsiella oxytoca NOT DETECTED NOT DETECTED Final   Klebsiella pneumoniae NOT DETECTED NOT DETECTED Final   Proteus species NOT DETECTED NOT DETECTED Final   Salmonella species NOT DETECTED NOT DETECTED Final   Serratia marcescens NOT DETECTED NOT DETECTED Final   Haemophilus influenzae NOT DETECTED NOT DETECTED Final   Neisseria meningitidis NOT DETECTED NOT DETECTED Final   Pseudomonas aeruginosa NOT DETECTED NOT DETECTED Final   Stenotrophomonas maltophilia NOT DETECTED NOT DETECTED Final   Candida albicans NOT DETECTED NOT DETECTED Final   Candida auris NOT DETECTED NOT DETECTED Final   Candida glabrata NOT DETECTED NOT DETECTED Final   Candida krusei NOT DETECTED NOT DETECTED Final   Candida parapsilosis NOT DETECTED NOT DETECTED Final   Candida tropicalis NOT DETECTED NOT DETECTED Final   Cryptococcus neoformans/gattii NOT DETECTED NOT DETECTED Final    Comment: Performed at [facility], [address] [place], [place] [address]  Culture, blood (Routine X 2) w Reflex to ID Panel     Status: None (Preliminary result)   Collection Time: 08/15/24  4:54 AM  Specimen: BLOOD  Result Value Ref Range Status   Specimen Description BLOOD SITE NOT SPECIFIED  Final   Special Requests   Final    BOTTLES DRAWN AEROBIC AND ANAEROBIC Blood Culture adequate volume   Culture   Final    NO GROWTH 3 DAYS Performed at [facility], [address] [place], [place] [address]    Report Status  PENDING  Incomplete         Radiology Studies: MR CERVICAL SPINE WO CONTRAST Result Date: 08/18/2024 EXAM: MRI CERVICAL SPINE WITHOUT CONTRAST 08/17/2024 07:16:52 PM TECHNIQUE: Multiplanar multisequence MRI of the cervical spine was performed. COMPARISON: MRI CERVICAL AND THORACIC SPINE WITHOUT CONTRAST 05/23/2024. CLINICAL HISTORY: Neck trauma, ligament injury suspected (Age >= 16y). FINDINGS: Motion limited study, particularly on the axial sequences and sagittal STIR sequence. Within this limitation: BONES AND ALIGNMENT: Normal alignment. Known cervical spine fractures characterized on prior CT of the cervical spine not well evaluated on this study. No new marrow edema to suggest interval fracture. SPINAL CORD: More discrete T2 hyperintensity at C3-C4 with cord volume loss, compatible with myelomalacia. SOFT TISSUES: Persistent, but improved edema in the interspinous ligaments at C3-C4 to C6-C7, compatible with strain. DISC LEVELS: Mild to moderate canal stenosis at C5-C6. Mild canal stenosis at C4-C5 and C6-C7. Nondiagnostic evaluation for foraminal stenosis due to motion on the axial sequences. IMPRESSION: 1. More discrete T2 hyperintensity at C3-C4 with cord volume loss, compatible with myelomalacia. 2. Persistent, but improved edema in the interspinous ligaments at C3-C4 to C6-C7, compatible with injury/strain. 3. Known cervical spine fractures characterized on prior CT of the cervical spine not well evaluated on this study. Normal alignment. 4. Mild to moderate canal stenosis at C5-C6. Mild canal stenosis at C4-C5 and C6-C7. 5. Nondiagnostic evaluation for foraminal stenosis due to motion on the axial sequences. Electronically signed by: Gilmore Molt 08/18/2024 03:35 AM EST RP Workstation: [id number]   MR THORACIC SPINE WO CONTRAST Result Date: 08/18/2024 EXAM: MRI THORACIC SPINE WITHOUT INTRAVENOUS CONTRAST 08/17/2024 07:15:12 PM TECHNIQUE: Multiplanar multisequence MRI of the thoracic spine  was performed without the administration of intravenous contrast. COMPARISON: MRI CERVICAL AND THORACIC SPINE WITHOUT CONTRAST 05/23/2024. CLINICAL HISTORY: Back trauma, abnormal neuro exam, CT or xray positive (Age >= 16y). FINDINGS: Moderately motion limited study.  Within this limitation: BONES AND ALIGNMENT: Healing T4, T5 and T6 fractures with decreased marrow edema. Similar extension into the posterior elements at T4. No change in alignment. SPINAL CORD: No obvious cord signal abnormality on motion limited assessment. SOFT TISSUES: Unremarkable.  No paraspinal edema. DEGENERATIVE CHANGES: Motion limits assessment but the bony retropulsion at T4-T5 appears similar to prior with mild mass effect on the cord. No high grade foraminal stenosis. Mild canal stenosis at multiple levels. IMPRESSION: 1. Healing T4, T5 and T6 fractures with decreased marrow edema. Similar extension into the posterior elements at T4. 2. Similar alignment. Bony retropulsion at T4-T5 appears similar to prior with mild mass effect on the cord. 3. Moderately motion limited study. Electronically signed by: Gilmore Molt 08/18/2024 03:15 AM EST RP Workstation: [id number]           LOS: 6 days   Time spent= 35 mins    Deliliah Room, MD [facility]  If 7PM-7AM, please contact night-coverage  08/18/2024, 11:32 AM

## 2024-08-18 NOTE — Plan of Care (Signed)

## 2024-08-18 NOTE — Consult Note (Signed)
 HPI:     Patient is a 43 y.o. female with past medical history significant for diabetes, GERD, schizophrenia, hypertension, hyperlipidemia, disability presented to ED 12/6 with caretaker concerns of AMS and fever. She has sacral decubitus ulcer. She is known to our practice in the setting of cervicothoracic spine fractures.     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   Multiple fractures of thoracic spine, closed (HCC) 08/12/2024   Closed fracture of multiple cervical vertebrae (HCC) 08/12/2024   Drug rash 08/12/2024   Cognitive developmental delay 08/12/2024   Lactic acid acidosis 08/12/2024   Schizoaffective disorder (HCC) 08/12/2024   Closed nondisplaced fracture of seventh cervical vertebra with routine healing 06/13/2024   Seizure disorder (HCC) 06/11/2024   Central cord syndrome (HCC) 05/23/2024   Pressure injury of skin 09/10/2019   Acute respiratory failure with hypoxia (HCC) 09/10/2019   COVID-19 virus infection 09/09/2019   Elevated AST (SGOT) 09/09/2019   Thrombocytopenia 09/09/2019   Positive D dimer 09/09/2019   Encephalopathy 09/09/2019   Sepsis due to pneumonia (HCC) 09/08/2019   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   Agitation    Moderate intellectual disabilities 01/25/2015   Schizoaffective disorder, depressive type (HCC) 01/18/2015   Type 2 diabetes mellitus without complication (HCC)    Hypoglycemia 11/30/2014   Allergic rhinitis 12/19/2009   DYSPEPSIA 09/26/2009   Constipation 09/26/2009   TINEA PEDIS 12/17/2008   HYPERLIPIDEMIA 01/08/2007   Essential hypertension 01/08/2007   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) 01/08/2007   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   Unknown   Amino Acids-Protein Hydrolys (FEEDING SUPPLEMENT, PRO-STAT SUGAR FREE 64,) LIQD Take 30 mLs by mouth in the morning and at bedtime.   08/11/2024 Evening   apixaban  (ELIQUIS ) 5 MG TABS tablet Take 5 mg by mouth 2 (two) times daily. For 3 months   08/11/2024 at  9:00 PM   [Paused] atenolol  (TENORMIN ) 50 MG tablet Take 1 tablet (50 mg total) by mouth daily. (Patient taking differently: Take 75 mg by mouth daily.) 2 tablet 0 08/11/2024 Morning   atorvastatin  (LIPITOR ) 80 MG tablet Take 1 tablet (80 mg total) by mouth at bedtime. 2 tablet 0 08/11/2024 Bedtime   benztropine  (COGENTIN ) 1 MG tablet Take 1 mg by mouth 2 (two) times daily.   08/11/2024 Evening   busPIRone  (BUSPAR ) 5 MG tablet Take 5 mg by mouth 3 (three) times daily.   08/11/2024 Bedtime   calcium -vitamin D  (OSCAL WITH D) 500-5 MG-MCG tablet Take 1 tablet by mouth 2 (two) times daily.   08/11/2024 Evening   cloZAPine  (CLOZARIL ) 100 MG tablet Take 100 mg by mouth 2 (two) times daily.    08/11/2024 Evening   divalproex  (DEPAKOTE ) 250 MG DR tablet Take 3  tablets (750 mg total) by mouth 3 (three) times daily.   08/11/2024 Bedtime   ezetimibe  (ZETIA ) 10 MG tablet Take 10 mg by mouth daily.   08/11/2024 Morning   gabapentin  (NEURONTIN ) 300 MG capsule Take 1 capsule (300 mg total) by mouth 3 (three) times daily.   08/11/2024 Bedtime   lubiprostone  (AMITIZA ) 24 MCG capsule Take 1 capsule (24 mcg total) by mouth daily with breakfast.   08/11/2024 Morning   methocarbamol  (ROBAXIN ) 500 MG tablet Take 2 tablets (1,000 mg total) by mouth every 8 (eight)  hours as needed for muscle spasms.   Unknown   omeprazole (PRILOSEC) 20 MG capsule Take 20 mg by mouth in the morning and at bedtime.   08/11/2024 Bedtime   polyethylene glycol (MIRALAX  / GLYCOLAX ) packet Take 17 g by mouth 2 (two) times daily. 60 packet 1 08/11/2024 Evening   predniSONE (DELTASONE) 20 MG tablet Take 20 mg by mouth daily with breakfast. For 5 days   08/11/2024 Morning   sennosides-docusate sodium  (SENOKOT-S) 8.6-50 MG tablet Take 1 tablet by mouth daily.   08/11/2024 Evening   simethicone  (MYLICON) 80 MG chewable tablet Chew 80 mg by mouth 2 (two) times daily.   08/11/2024 Evening   Vitamin D , Ergocalciferol , (DRISDOL ) 1.25 MG (50000 UNIT) CAPS capsule Take 1 capsule (50,000 Units total) by mouth once a week. (Patient taking differently: Take 50,000 Units by mouth once a week. Thursdays)   08/10/2024   zinc  oxide (BALMEX) 11.3 % CREA cream Apply 1 Application topically as needed.   Unknown   collagenase  (SANTYL ) 250 UNIT/GM ointment Apply topically daily. (Patient not taking: Reported on 08/12/2024)   Not Taking   [Paused] ferrous sulfate  325 (65 FE) MG tablet Take 1 tablet (325 mg total) by mouth daily with breakfast. (Patient not taking: Reported on 08/12/2024) 30 tablet 0 Not Taking   [Paused] lisinopril  (PRINIVIL ,ZESTRIL ) 2.5 MG tablet Take 1 tablet (2.5 mg total) by mouth daily. (Patient not taking: Reported on 06/11/2024) 2 tablet 0 Not Taking   naloxone  (NARCAN ) 0.4 MG/ML injection Inject 1 mL (0.4 mg total) into the vein as needed. (Patient not taking: Reported on 06/11/2024)   Not Taking   oxyCODONE  (OXY IR/ROXICODONE ) 5 MG immediate release tablet Take 0.5-1 tablets (2.5-5 mg total) by mouth every 6 (six) hours as needed for breakthrough pain (2.5 mg for moderate, 5 mg for severe). (Patient not taking: Reported on 08/12/2024) 10 tablet 0 Not Taking   piperacillin -tazobactam (ZOSYN ) 3.375 GM/50ML IVPB Inject 3.375 g into the vein every 8 (eight) hours. For 6 weeks (Patient not taking:  Reported on 08/12/2024)   Not Taking   Allergies[1]  Social History   Tobacco Use   Smoking status: Never   Smokeless tobacco: Never  Substance Use Topics   Alcohol use: No    Family History  Problem Relation Age of Onset   Diabetes Mellitus II Mother    Cancer Father       Objective:   Patient Vitals for the past 8 hrs:  BP Temp Pulse Resp SpO2  08/18/24 0736 113/68 97.6 F (36.4 C) 81 16 98 %  08/18/24 0416 129/84 98.9 F (37.2 C) 92 -- 95 %  08/18/24 0051 107/70 98.5 F (36.9 C) 84 -- 95 %   I/O last 3 completed shifts: In: 420 [P.O.:420] Out: 1700 [Urine:1700] No intake/output data recorded.   MR CERVICAL SPINE WO CONTRAST Result Date: 08/18/2024 EXAM: MRI CERVICAL SPINE WITHOUT CONTRAST 08/17/2024 07:16:52 PM TECHNIQUE: Multiplanar multisequence MRI of  the cervical spine was performed. COMPARISON: MRI CERVICAL AND THORACIC SPINE WITHOUT CONTRAST 05/23/2024. CLINICAL HISTORY: Neck trauma, ligament injury suspected (Age >= 16y). FINDINGS: Motion limited study, particularly on the axial sequences and sagittal STIR sequence. Within this limitation: BONES AND ALIGNMENT: Normal alignment. Known cervical spine fractures characterized on prior CT of the cervical spine not well evaluated on this study. No new marrow edema to suggest interval fracture. SPINAL CORD: More discrete T2 hyperintensity at C3-C4 with cord volume loss, compatible with myelomalacia. SOFT TISSUES: Persistent, but improved edema in the interspinous ligaments at C3-C4 to C6-C7, compatible with strain. DISC LEVELS: Mild to moderate canal stenosis at C5-C6. Mild canal stenosis at C4-C5 and C6-C7. Nondiagnostic evaluation for foraminal stenosis due to motion on the axial sequences. IMPRESSION: 1. More discrete T2 hyperintensity at C3-C4 with cord volume loss, compatible with myelomalacia. 2. Persistent, but improved edema in the interspinous ligaments at C3-C4 to C6-C7, compatible with injury/strain. 3. Known  cervical spine fractures characterized on prior CT of the cervical spine not well evaluated on this study. Normal alignment. 4. Mild to moderate canal stenosis at C5-C6. Mild canal stenosis at C4-C5 and C6-C7. 5. Nondiagnostic evaluation for foraminal stenosis due to motion on the axial sequences. Electronically signed by: Gilmore Molt 08/18/2024 03:35 AM EST RP Workstation: [id number]   MR THORACIC SPINE WO CONTRAST Result Date: 08/18/2024 EXAM: MRI THORACIC SPINE WITHOUT INTRAVENOUS CONTRAST 08/17/2024 07:15:12 PM TECHNIQUE: Multiplanar multisequence MRI of the thoracic spine was performed without the administration of intravenous contrast. COMPARISON: MRI CERVICAL AND THORACIC SPINE WITHOUT CONTRAST 05/23/2024. CLINICAL HISTORY: Back trauma, abnormal neuro exam, CT or xray positive (Age >= 16y). FINDINGS: Moderately motion limited study.  Within this limitation: BONES AND ALIGNMENT: Healing T4, T5 and T6 fractures with decreased marrow edema. Similar extension into the posterior elements at T4. No change in alignment. SPINAL CORD: No obvious cord signal abnormality on motion limited assessment. SOFT TISSUES: Unremarkable.  No paraspinal edema. DEGENERATIVE CHANGES: Motion limits assessment but the bony retropulsion at T4-T5 appears similar to prior with mild mass effect on the cord. No high grade foraminal stenosis. Mild canal stenosis at multiple levels. IMPRESSION: 1. Healing T4, T5 and T6 fractures with decreased marrow edema. Similar extension into the posterior elements at T4. 2. Similar alignment. Bony retropulsion at T4-T5 appears similar to prior with mild mass effect on the cord. 3. Moderately motion limited study. Electronically signed by: Gilmore Molt 08/18/2024 03:15 AM EST RP Workstation: [id number]   Awake, alert.  Speech fluent CN grossly intact Strength 2-3/5 diffusely BUE/BLE, minimal participation d/t pain.  Collar in place  Assessment:   Principal Problem:   Petechial  rash Active Problems:   Sepsis (HCC)   Aortic valve mass   Multiple fractures of thoracic spine, closed (HCC)   Closed fracture of multiple cervical vertebrae (HCC)   Drug rash   Cognitive developmental delay   Lactic acid acidosis   Schizoaffective disorder (HCC)  This is a 32 with known cervicothoracic fractures/spinal cord contusion with minimal neck/upper back pain. Fractures/contusion appear to be resolving based upon most recent imaging.   Plan:   -Ok to liberalize from collar as tolerated.  -Continue supportive care per primary team.   Hermione Havlicek CAYLIN Bennette Hasty, PA-C      [1]  Allergies Allergen Reactions   Haloperidol Lactate Other (See Comments)    Unknown reaction   Shrimp [Shellfish Allergy] Hives and Rash    All Shellfish per [facility]   Strawberry (Diagnostic) Rash

## 2024-08-18 NOTE — Evaluation (Signed)
 Occupational Therapy Evaluation Patient Details Name: Alexandria Harvey MRN: [id number] DOB: May 24, 1981 Today's Date: 08/18/2024   History of Present Illness   43 y.o. female admitted 08/11/24 for petechial rash suspect adverse drug reaction to Zosyn . 12/6 (+) DVT in BLE. PMHx: intellectual disability, schizophrenia, dropped head syndrome, HTN, seizures, diabetes, hx of C3, C5, C7 fxs with central cord syndrome, hx of T4-T5 fxs, and L foot/ankle fx.     Clinical Impressions Since SCI in 05/2024, pt has required assistance with ADLs, IADLs, and all mobility. Pt presented to [facility] from SNF where she was receiving skilled rehab services. Pt now presents with decreased functional use of B UE, impaired B UE strength, decreased B UE coordination, impaired B UE sensation, and decreased safety and independence with functional tasks. Pt currently requiring Max to Total assist of +1 to +2 for ADLs and Max assist +2 for rolling in the bed. Pt with goal of being able to feed herself which is an appropriate and realistic goal for pt. Pt participated well in session and is motivated to continue with skilled rehab services. Pt VSS on RA. Pt will benefit from acute skilled OT services to address deficits and increase safety and independence with functional tasks. Post acute discharge, pt will benefit from continued intensive inpatient skilled rehab services < 3 hours per day to maximize rehab potential.      If plan is discharge home, recommend the following:   Two people to help with walking and/or transfers;Two people to help with bathing/dressing/bathroom;Assistance with cooking/housework;Assistance with feeding;Direct supervision/assist for medications management;Direct supervision/assist for financial management;Assist for transportation;Help with stairs or ramp for entrance;Supervision due to cognitive status     Functional Status Assessment   Patient has had a recent decline in their functional status  and demonstrates the ability to make significant improvements in function in a reasonable and predictable amount of time.     Equipment Recommendations   Other (comment) (defer to next level of care)     Recommendations for Other Services         Precautions/Restrictions   Precautions Precautions: Cervical;Fall Precaution Booklet Issued: No Recall of Precautions/Restrictions: Impaired Required Braces or Orthoses: Cervical Brace Cervical Brace: Hard collar;At all times Restrictions Other Position/Activity Restrictions: cervical precautions     Mobility Bed Mobility Overal bed mobility: Needs Assistance Bed Mobility: Rolling Rolling: Max assist, +2 for physical assistance         General bed mobility comments: Pt requires assist to flex each knee and reach for rail. Pt able to grip rail with RUE, but not LUE. Further mobility deferred due to pt fatigue following B UE exercises.    Transfers Overall transfer level: Needs assistance                 General transfer comment: Deferred d/t fatigue. Recommend maximove to transfer OOB with +2 assist.      Balance                                           ADL either performed or assessed with clinical judgement   ADL Overall ADL's : Needs assistance/impaired                                       General ADL Comments: Pt demonstrating ability  to grasp cup with Right hand with CGA-Min assist but unable to bring it to mouth. Pt requiring Max to Total assist +1 to +2 for ADLs from bed level. Pt has a goal of being able to feed herself.     Vision Baseline Vision/History: 1 Wears glasses (for distance, but does not have them in the hospital) Ability to See in Adequate Light: 1 Impaired (difficulty seeing items at longer distances, can see TV in the room) Patient Visual Report: No change from baseline Additional Comments: Some difficulty seeing items at a distance (able to read  large number on calendar on opposite wall, but not the day of the week written in a smaller font); vision otherwise [facility] for tasks assessed     Perception         Praxis         Pertinent Vitals/Pain Pain Assessment Pain Assessment: Faces Faces Pain Scale: Hurts little more Pain Location: L shoulder and elbow with AAROM/PROM Pain Descriptors / Indicators: Aching, Discomfort, Grimacing Pain Intervention(s): Limited activity within patient's tolerance, Monitored during session, Repositioned, Other (comment) (Reassured pt that OT would perform all ROM gently and slowly, would not force any movement, and would stop ROM whenever pt said stop.)     Extremity/Trunk Assessment Upper Extremity Assessment Upper Extremity Assessment: Right hand dominant;LUE deficits/detail;RUE deficits/detail RUE Deficits / Details: Pt able to extend/flex elbow against gravity, flex/extend wrist with forearm supported by OT, and grasp OT's hand with 2+/5 strength; pt able to shrug shoulder, but unable to to move against gravity; pt tolerated gentle, slow AAROM/PROM of shoulder well with shoulder flexion to approx 80 degrees RUE Sensation: decreased light touch;decreased proprioception RUE Coordination: decreased fine motor;decreased gross motor LUE Deficits / Details: Pt unable to lift arm, bend elbow, or move wrist against gravity. Pt able to slightly grip OT's hand and assist with AAROM of wrist and elbow. Pt also able to shrug shoulder minimally. Pt tolerated PROM/AAROM of all joints with shoulder flexion PROM to approx 75 degrees; mildly edematous hand, wrist, and forearm LUE Sensation: decreased light touch;decreased proprioception LUE Coordination: decreased fine motor;decreased gross motor   Lower Extremity Assessment Lower Extremity Assessment: Defer to PT evaluation   Cervical / Trunk Assessment Cervical / Trunk Assessment: Other exceptions Cervical / Trunk Exceptions: C3, C5, C7 fxs in CTO brace and  with T4-5 fxs   Communication Communication Communication: Impaired Factors Affecting Communication: Reduced clarity of speech;Other (comment) (requires increased time for processing. Benefits from use of short sentences for improved comprehension.)   Cognition Arousal: Alert Behavior During Therapy: WFL for tasks assessed/performed, Flat affect, Anxious (Initially with signs of mild anxiety, but quickly calmed with reassurance that OT would stop movement/activity if pt said stop; pt also benfited from OT outlining goals of session prior to movement and with OT narrating all activities and movements) Cognition: History of cognitive impairments             OT - Cognition Comments: Pt with hx of intellectual disability. Pt AAOx4 and pleasant throughout session. Pt able to follow 1-step commands and demonstrates good memory with pt stating her mother's phone number from memory, appropriately engaging others in conversation, and accurately reporting events of the day to her mother over the phone. Pt requiing increased time for processing and with noted mild executive functioning deficits. Suspect pt cognition is near baseline.                 Following commands: Impaired Following commands impaired: Only  follows one step commands consistently, Follows one step commands with increased time, Follows multi-step commands inconsistently     Cueing  General Comments   Cueing Techniques: Verbal cues;Gestural cues;Tactile cues;Visual cues  Pt requesting to call her mother at end of session to update mom on her progress. Pt remembering phone number and accurately reporting events of the day. OT also briefly spoke with pt's mother to provide update of OT POC. VSS on RA. OT repositioned L UE at end of session to address edema. Pt finishing lunch with RN assist upon OT arrival.   Exercises Exercises: General Upper Extremity General Exercises - Upper Extremity Shoulder Flexion: AAROM, Right,  PROM, Left, 5 reps, Supine, Strengthening (increase ROM; with HOB elevated) Elbow Flexion: AROM, Right, AAROM, Left, 5 reps, Supine, Strengthening (increase ROM; with HOB elevated) Elbow Extension: AROM, Right, AAROM, Left, 5 reps, Strengthening, Supine (increase ROM; with HOB elevated) Wrist Flexion: AROM, Right, AAROM, Left, 5 reps, Supine, Strengthening (increase ROM; with HOB elevated) Wrist Extension: AROM, Right, AAROM, Left, 5 reps, Strengthening, Supine (increase ROM; with HOB elevated) Digit Composite Flexion: AROM, Right, AAROM, Left, 5 reps, Supine, Strengthening (increase ROM; with HOB elevated) Composite Extension: AROM, Right, AAROM, Left, 5 reps, Supine, Strengthening (increase ROM; with HOB elevated)   Shoulder Instructions      Home Living Family/patient expects to be discharged to:: Skilled nursing facility                                 Additional Comments: Prior to SCI in 05/2024, pt living in a group home. Since hospitalization in 05/2024, pt has been at [facility] for rehab. Plan to return to SNF for continued rehab.      Prior Functioning/Environment Prior Level of Function : Needs assist             Mobility Comments: Since SCI in 05/2024, pt largely requiring Total assist +1 to +2 for bed mobility. ADLs Comments: Since SCI in 05/2024, pt largely requiring Total assist +1 to +2 for ADLs and receives assist for all IADLs. Pt enjoys watching [employer] and the 1960s Christmas movie [employer]    OT Problem List: Decreased strength;Decreased range of motion;Decreased activity tolerance;Decreased coordination;Impaired UE functional use;Impaired sensation;Increased edema   OT Treatment/Interventions: Self-care/ADL training;Therapeutic exercise;Neuromuscular education;DME and/or AE instruction;Therapeutic activities;Cognitive remediation/compensation;Patient/family education      OT Goals(Current goals can be found in the care plan  section)   Acute Rehab OT Goals Patient Stated Goal: to be able to feed herself and to continue participating in rehab OT Goal Formulation: With patient Time For Goal Achievement: 09/01/24 Potential to Achieve Goals: Good ADL Goals Pt Will Perform Eating: with max assist;bed level (with HOB elevated and with adaptive equipment as needed) Pt Will Perform Grooming: with max assist;bed level (wash and dry face; with HOB elevated; with adaptive equipment as needed) Pt/caregiver will Perform Home Exercise Program: Increased ROM;Increased strength;Both right and left upper extremity;With minimal assist;With written HEP provided (AAROM/AROM) Additional ADL Goal #1: Patient will demonstrate ability to use R UE to grasp 1 to 2 foam cubes of various densities and successfully release cubes into a target area on tabletop in 4/5 attempts with Min assist to increase strength and coordination for carryover to functional tasks.   OT Frequency:  Min 1X/week    Co-evaluation              AM-PAC OT 6 Clicks  Daily Activity     Outcome Measure Help from another person eating meals?: A Lot Help from another person taking care of personal grooming?: Total Help from another person toileting, which includes using toliet, bedpan, or urinal?: Total Help from another person bathing (including washing, rinsing, drying)?: Total Help from another person to put on and taking off regular upper body clothing?: Total Help from another person to put on and taking off regular lower body clothing?: Total 6 Click Score: 7   End of Session Nurse Communication: Mobility status;Other (comment) (OT POC. Pt called her mother with OT assist and gave an accurate account of her day. Pt finished her apple juice.)  Activity Tolerance: Patient tolerated treatment well Patient left: in bed;with call bell/phone within reach  OT Visit Diagnosis: Other abnormalities of gait and mobility (R26.89);Muscle weakness (generalized)  (M62.81);Ataxia, unspecified (R27.0)                Time: 8444-8366 OT Time Calculation (min): 38 min Charges:  OT General Charges $OT Visit: 1 Visit OT Evaluation $OT Eval Moderate Complexity: 1 Mod OT Treatments $Therapeutic Exercise: 8-22 mins  Margarie Rockey HERO., OTR/L, MA Acute Rehab [phone]   Margarie FORBES Horns 08/18/2024, 5:28 PM

## 2024-08-18 NOTE — [place] FL2 (Addendum)
 [place]  MEDICAID FL2 LEVEL OF CARE FORM     IDENTIFICATION  Patient Name: Alexandria Harvey Birthdate: 02-14-1981 Sex: female Admission Date (Current Location): 08/11/2024  [facility] and [place] Number:  Producer, Television/film/video and Address:  [facility], 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   Multiple fractures of thoracic spine, closed (HCC) 08/12/2024   Closed fracture of multiple cervical vertebrae (HCC) 08/12/2024   Drug rash 08/12/2024   Cognitive developmental delay 08/12/2024   Lactic acid acidosis 08/12/2024   Schizoaffective disorder (HCC) 08/12/2024   Closed nondisplaced fracture of seventh cervical vertebra with routine healing 06/13/2024   Seizure disorder (HCC) 06/11/2024   Central cord syndrome (HCC) 05/23/2024   Pressure injury of skin 09/10/2019   Acute respiratory failure with hypoxia (HCC) 09/10/2019   COVID-19 virus infection 09/09/2019   Elevated AST (SGOT) 09/09/2019   Thrombocytopenia 09/09/2019   Positive D dimer 09/09/2019   Encephalopathy 09/09/2019   Sepsis due to pneumonia (HCC) 09/08/2019   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   Agitation    Moderate intellectual disabilities 01/25/2015   Schizoaffective disorder, depressive type (HCC) 01/18/2015   Type 2 diabetes mellitus without complication (HCC)    Hypoglycemia 11/30/2014   Allergic rhinitis 12/19/2009   DYSPEPSIA 09/26/2009   Constipation  09/26/2009   TINEA PEDIS 12/17/2008   HYPERLIPIDEMIA 01/08/2007   Essential hypertension 01/08/2007   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) 01/08/2007    Orientation RESPIRATION BLADDER Height & Weight     Self, Situation, Place  Normal Incontinent Weight: 154 lb 12.2 oz (70.2 kg) (last admission) Height:  5' 2 (157.5 cm)  BEHAVIORAL SYMPTOMS/MOOD NEUROLOGICAL BOWEL NUTRITION STATUS      Incontinent Diet (please refer to dc summary)  AMBULATORY STATUS COMMUNICATION OF NEEDS Skin   Extensive Assist Verbally Other (Comment) (pressure injury head and sacrum)                       Personal Care Assistance Level of Assistance  Bathing, Dressing, Feeding Bathing Assistance: Limited assistance Feeding Assistance: Limited assistance Dressing Assistance: Limited assistance     Functional Limitations Info  Sight, Hearing, Speech Sight Info: Adequate Hearing Info: Adequate Speech Info: Adequate    SPECIAL CARE FACTORS FREQUENCY  PT (By licensed PT), OT (By licensed OT)     PT Frequency: 5x/week OT Frequency: 5x/week            Contractures Contractures Info: Not present    Additional Factors Info  Code Status, Allergies Code Status Info: Full Allergies Info: Zosyn , haloperidol lactate, shrimp, strawberry           Current Medications (08/18/2024):  This is the current hospital active medication list Current Facility-Administered Medications  Medication Dose Route Frequency Provider Last Rate Last Admin   acetaminophen  (TYLENOL ) tablet 650 mg  650 mg Oral Q6H PRN Dena Charleston, MD   650 mg at 08/18/24 [phone]  Or   acetaminophen  (TYLENOL ) suppository 650 mg  650 mg Rectal Q6H PRN Dena Charleston, MD       apixaban  (ELIQUIS ) tablet 5 mg  5 mg Oral BID Dena Charleston, MD   5 mg at 08/18/24 9090   cloZAPine  (CLOZARIL ) tablet 50 mg  50 mg Oral BID Lord, Jamison Y, NP   50 mg at 08/18/24 9090   collagenase  (SANTYL ) ointment   Topical  Daily Samtani, Jai-Gurmukh, MD   Given at 08/18/24 0908   diphenhydrAMINE -zinc  acetate (BENADRYL ) 2-0.1 % cream   Topical BID Samtani, Jai-Gurmukh, MD   Given at 08/18/24 0908   divalproex  (DEPAKOTE ) DR tablet 250 mg  250 mg Oral TID Samtani, Jai-Gurmukh, MD   250 mg at 08/18/24 9090   gabapentin  (NEURONTIN ) capsule 100 mg  100 mg Oral TID Samtani, Jai-Gurmukh, MD   100 mg at 08/18/24 9090   ondansetron  (ZOFRAN ) tablet 4 mg  4 mg Oral Q6H PRN Dena Charleston, MD       Or   ondansetron  (ZOFRAN ) injection 4 mg  4 mg Intravenous Q6H PRN Dorrell, Charleston, MD       oxyCODONE  (Oxy IR/ROXICODONE ) immediate release tablet 2.5 mg  2.5 mg Oral Q4H PRN Samtani, Jai-Gurmukh, MD   2.5 mg at 08/17/24 2230   polyethylene glycol (MIRALAX  / GLYCOLAX ) packet 17 g  17 g Oral BID Samtani, Jai-Gurmukh, MD   17 g at 08/18/24 0913   senna-docusate (Senokot-S) tablet 1 tablet  1 tablet Oral Daily Samtani, Jai-Gurmukh, MD   1 tablet at 08/18/24 0908     Discharge Medications: Please see discharge summary for a list of discharge medications.  Relevant Imaging Results:  Relevant Lab Results:   Additional Information SS #: [id number], [address]

## 2024-08-18 NOTE — Plan of Care (Signed)

## 2024-08-18 NOTE — TOC Initial Note (Signed)
 Transition of Care [facility]) - Initial/Assessment Note    Patient Details  Name: Alexandria Harvey MRN: [id number] Date of Birth: [birth year]  Transition of Care [facility]) CM/SW Contact:    Sherline Clack, LCSWA Phone Number: 08/18/2024, 11:54 AM  Clinical Narrative:                  CSW met with guardian, Nat, at bedside to discuss discharge plan. Guardian expressed patient's mother and herself were not agreeable to patient returning to [facility] and asked if CSW could fax patient out to other facilities. CSW filled out FL2 and faxed patient out to [place] and surrounding cities for placement. SW Nat informed CSW patient has [employer] that can be transitioned into long term care Medicaid if needed. CSW gave [facility] [place] direct line number to call for updates. CSW will continue to follow.    Expected Discharge Plan: Skilled Nursing Facility Barriers to Discharge: Insurance Authorization, SNF Pending bed offer   Patient Goals and CMS Choice     Choice offered to / list presented to : [facility] POA / Guardian, Parent      Expected Discharge Plan and Services       Living arrangements for the past 2 months: Skilled Nursing Facility, Group Home                                      Prior Living Arrangements/Services Living arrangements for the past 2 months: Skilled Nursing Facility, Group Home Lives with:: Facility Resident                   Activities of Daily Living   ADL Screening (condition at time of admission) Independently performs ADLs?: No Does the patient have a NEW difficulty with bathing/dressing/toileting/self-feeding that is expected to last >3 days?: Yes (Initiates electronic notice to provider for possible OT consult) Does the patient have a NEW difficulty with getting in/out of bed, walking, or climbing stairs that is expected to last >3 days?: Yes (Initiates electronic notice to provider for possible PT consult) Does  the patient have a NEW difficulty with communication that is expected to last >3 days?: No Is the patient deaf or have difficulty hearing?: No Does the patient have difficulty seeing, even when wearing glasses/contacts?: No Does the patient have difficulty concentrating, remembering, or making decisions?: Yes  Permission Sought/Granted                  Emotional Assessment Appearance:: Appears stated age Attitude/Demeanor/Rapport: Unable to Assess Affect (typically observed): Unable to Assess Orientation: : Oriented to Self, Oriented to Place, Oriented to Situation      Admission diagnosis:  Petechial rash [R23.3] Sepsis (HCC) [A41.9] Sepsis, due to unspecified organism, unspecified whether acute organ dysfunction present [facility]) [A41.9] 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   Multiple fractures of thoracic spine, closed (HCC) 08/12/2024   Closed fracture of multiple cervical vertebrae (HCC) 08/12/2024   Drug rash 08/12/2024   Cognitive developmental delay 08/12/2024   Lactic acid acidosis 08/12/2024   Schizoaffective disorder (HCC) 08/12/2024   Closed nondisplaced fracture of seventh cervical vertebra with routine healing 06/13/2024   Seizure disorder (HCC) 06/11/2024   Central cord syndrome (HCC) 05/23/2024   Pressure injury of skin 09/10/2019   Acute respiratory failure with  hypoxia (HCC) 09/10/2019   COVID-19 virus infection 09/09/2019   Elevated AST (SGOT) 09/09/2019   Thrombocytopenia 09/09/2019   Positive D dimer 09/09/2019   Encephalopathy 09/09/2019   Sepsis due to pneumonia (HCC) 09/08/2019   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 [place] [address] Phone: [phone] Fax: [phone]  [facility] - [place], [place] - [address] [place] [address] Phone: [phone] Fax: [phone]  [facility] - [place], [place] - [address] [place] [address] Phone: [phone] Fax: [phone]     Social Drivers of Health (SDOH) Social History: SDOH Screenings   Food Insecurity: No Food Insecurity (06/11/2024)  Housing: Low Risk (06/11/2024)  Transportation Needs: No Transportation Needs (06/11/2024)  Utilities: Not At Risk (06/11/2024)  Financial Resource Strain: High Risk (02/29/2024)   Received from [facility]  Physical Activity: Unknown (02/29/2024)   Received from [facility]  Social Connections: Moderately Integrated (02/29/2024)   Received from [facility]  Stress: No Stress Concern Present (02/29/2024)   Received from [facility]  Tobacco Use: Low Risk (08/15/2024)   SDOH Interventions:     Readmission Risk Interventions    06/13/2024    9:35 AM  Readmission Risk Prevention Plan  Transportation Screening Complete  PCP or Specialist Appt within 5-7 Days Complete  Home Care Screening Complete  Medication Review (RN CM) Complete

## 2024-08-19 DIAGNOSIS — R233 Spontaneous ecchymoses: Secondary | ICD-10-CM | POA: Diagnosis not present

## 2024-08-19 LAB — CBC WITH DIFFERENTIAL/PLATELET
Abs Immature Granulocytes: 0.1 K/uL — ABNORMAL HIGH (ref 0.00–0.07)
Basophils Absolute: 0.2 K/uL — ABNORMAL HIGH (ref 0.0–0.1)
Basophils Relative: 1 %
Eosinophils Absolute: 0.3 K/uL (ref 0.0–0.5)
Eosinophils Relative: 2 %
HCT: 35.7 % — ABNORMAL LOW (ref 36.0–46.0)
Hemoglobin: 11.3 g/dL — ABNORMAL LOW (ref 12.0–15.0)
Immature Granulocytes: 1 %
Lymphocytes Relative: 31 %
Lymphs Abs: 4.5 K/uL — ABNORMAL HIGH (ref 0.7–4.0)
MCH: 25.6 pg — ABNORMAL LOW (ref 26.0–34.0)
MCHC: 31.7 g/dL (ref 30.0–36.0)
MCV: 81 fL (ref 80.0–100.0)
Monocytes Absolute: 1.3 K/uL — ABNORMAL HIGH (ref 0.1–1.0)
Monocytes Relative: 9 %
Neutro Abs: 8.5 K/uL — ABNORMAL HIGH (ref 1.7–7.7)
Neutrophils Relative %: 56 %
Platelets: 446 K/uL — ABNORMAL HIGH (ref 150–400)
RBC: 4.41 MIL/uL (ref 3.87–5.11)
RDW: 19.4 % — ABNORMAL HIGH (ref 11.5–15.5)
WBC: 14.9 K/uL — ABNORMAL HIGH (ref 4.0–10.5)
nRBC: 0.2 % (ref 0.0–0.2)

## 2024-08-19 LAB — CULTURE, BLOOD (ROUTINE X 2)
Culture: NO GROWTH
Culture: NO GROWTH
Special Requests: ADEQUATE
Special Requests: ADEQUATE

## 2024-08-19 LAB — CBC
HCT: 36.9 % (ref 36.0–46.0)
Hemoglobin: 11.3 g/dL — ABNORMAL LOW (ref 12.0–15.0)
MCH: 25.3 pg — ABNORMAL LOW (ref 26.0–34.0)
MCHC: 30.6 g/dL (ref 30.0–36.0)
MCV: 82.6 fL (ref 80.0–100.0)
Platelets: 492 K/uL — ABNORMAL HIGH (ref 150–400)
RBC: 4.47 MIL/uL (ref 3.87–5.11)
RDW: 19.9 % — ABNORMAL HIGH (ref 11.5–15.5)
WBC: 14.7 K/uL — ABNORMAL HIGH (ref 4.0–10.5)
nRBC: 0.2 % (ref 0.0–0.2)

## 2024-08-19 LAB — GLUCOSE, CAPILLARY
Glucose-Capillary: 247 mg/dL — ABNORMAL HIGH (ref 70–99)
Glucose-Capillary: 254 mg/dL — ABNORMAL HIGH (ref 70–99)
Glucose-Capillary: 196 mg/dL — ABNORMAL HIGH (ref 70–99)
Glucose-Capillary: 175 mg/dL — ABNORMAL HIGH (ref 70–99)

## 2024-08-19 MED ORDER — INSULIN ASPART 100 UNIT/ML IJ SOLN
0.0000 [IU] | Freq: Three times a day (TID) | INTRAMUSCULAR | Status: DC
  Administered 2024-08-19: 1 [IU] via SUBCUTANEOUS
  Administered 2024-08-20 – 2024-08-21 (×4): 2 [IU] via SUBCUTANEOUS
  Administered 2024-08-21: 09:00:00 1 [IU] via SUBCUTANEOUS
  Administered 2024-08-22 – 2024-08-23 (×2): 2 [IU] via SUBCUTANEOUS
  Administered 2024-08-23 – 2024-08-24 (×2): 3 [IU] via SUBCUTANEOUS
  Filled 2024-08-19 (×2): qty 2
  Filled 2024-08-19: qty 1
  Filled 2024-08-19 (×4): qty 2
  Filled 2024-08-19: qty 3
  Filled 2024-08-19 (×2): qty 2

## 2024-08-19 NOTE — Progress Notes (Signed)
 PROGRESS NOTE    Alexandria Harvey  FMW:[id number] DOB: [birth year] DOA: 08/11/2024 PCP: [facility]   Brief Narrative:  43 year old female- Under guardianship of care for DSS-phone #[phone]--- apparently also has a caregiver [name]/[name] who is the group home, DM, mental retardation, Schizoaffective disorder, previous hallucination, sacral decubitus ulcer, cervical fracture with collar - p/w fever and encephalopathy.    Assessment & Plan:  Principal Problem:   Petechial rash Active Problems:   Sepsis (HCC)   Aortic valve mass   Multiple fractures of thoracic spine, closed (HCC)   Closed fracture of multiple cervical vertebrae (HCC)   Drug rash   Cognitive developmental delay   Lactic acid acidosis   Schizoaffective disorder (HCC)    Concern for sepsis, resolved now Presented with leukocytosis, lactic acidosis, concern for sepsis.  She has been afebrile here so far off antibiotics since 12/6. Blood cultures done off antibiotics on 12/8 and 12/9 NGTD  Got empiric antibiotics initiated at presentation.  Monitor blood pressure closely.  Evaluated by ID, Antibiotics stopped.  Currently on line holiday.   Autoimmune workup unremarkable with negative ANA, c-ANCA, p-ANCA, RF, Sjogren syndrome antibody A and B negative, negative lupus anticoagulant     Acute metabolic encephalopathy, resolved Possible polypharmacy, dehydration, ? sepsis, in the setting of intellectual delay. Medications weaned back.  Mental status back to baseline. Delirium precautions.   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   Evaluated by infectious disease.  Only minimal concern for infectious etiology, other differentials include Libman-Sacks, myxoma, clot.   Autoimmune workup so far negative (negative ANA, RA, ANCA).  Negative lupus anticoagulant panel.   Follow-up blood cultures closely.  Cardiology team favoring repeat TTE echo in 3 months after she has been on  anticoagulation to evaluate need for TEE Continue eliquis  therapy.   Acute rash Unclear etiology, thought to be possible drug reaction versus excoriations from bedbugs.  Antibiotics stopped.  Contact precautions.  Continue monitor closely.     Unstageable sacral ulcer (POA) Was previously on vancomycin  prior to visit to ED.  Wound evaluated by wound care.  Wound care per their recommendations.   Diabetes mellitus type 2 Continue accucheks, Insulin  sliding scale per floor protocol.   History of cervical fractures with polytrauma Remains in c-collar.  MRI C and T spine were done and they are reassuring. Ordered to remove C-spine collar as per neurosurgeon, Dr Valeda recommendations mentioned in his note on 12/12.   Acute DVT BL LE Lower extremity Dopplers noting BL proximal femoral vein DVTs.  Continue Eliquis .  Unclear if provoked or unprovoked.  Hypercoagulable workup, so far negative.   Severe constipation Noted incidentally on CT scan.  Continue bowel regimen. Encourage out of bed to chair.   Cognitive deficits/schizophrenia Resides at a group home.  guardianship of care for DSS-phone #[phone]--- apparently also has a caregiver [name]/[name] who is the group home.   Patient evaluated by psychiatry.  Medication recommendations per psych.  Disposition: She is from [facility]. Guardian wants a different facility on discharge. This has been communicated to the child psychotherapist.    DVT prophylaxis: SCDs Start: 08/12/24 9360 apixaban  (ELIQUIS ) tablet 5 mg     Code Status: Full Code Family Communication:  None at the bedside Status is: Inpatient Remains inpatient appropriate because: Pending placement    Subjective:  She is alert and awake. C-collar is in place. Spoke to her about removing C-collar as recommended by neurosurgery based on her  recent MRI C-spine results. She is happy about that.  Examination:  General exam: Appears alert and awake, C-collar in place, scar on the  right side of the forehead noted Respiratory system: Clear to auscultation. Respiratory effort normal. Cardiovascular system: S1 & S2 heard, RRR. No JVD, murmurs, rubs, gallops or clicks. No pedal edema. Gastrointestinal system: Abdomen is nondistended, soft and nontender. No organomegaly or masses felt. Normal bowel sounds heard. Central nervous system: No focal neuro deficits Extremities: No acute findings   Wound 06/11/24 Pressure Injury Sacrum Mid Unstageable - Full thickness tissue loss in which the base of the injury is covered by slough (yellow, tan, gray, green or brown) and/or eschar (tan, brown or black) in the wound bed. (Active)     Wound 08/16/24 0259 Pressure Injury Head Posterior Unstageable - Full thickness tissue loss in which the base of the injury is covered by slough (yellow, tan, gray, green or brown) and/or eschar (tan, brown or black) in the wound bed. (Active)     Diet Orders (From admission, onward)     Start     Ordered   08/15/24 1735  Diet regular Room service appropriate? Yes; Fluid consistency: Thin  Diet effective now       Comments: Patient has allergies to fish.  She does not like chicken or salad.  Question Answer Comment  Room service appropriate? Yes   Fluid consistency: Thin      08/15/24 1736            Objective: Vitals:   08/18/24 1936 08/19/24 0010 08/19/24 0437 08/19/24 0806  BP: (!) 127/96 (!) 144/76 121/79 (!) 147/89  Pulse: 100 91 88 95  Resp:  17 18 17   Temp:  97.6 F (36.4 C) 99 F (37.2 C) 98.1 F (36.7 C)  TempSrc:  Oral Oral Oral  SpO2: 95% 100% 99% 99%  Weight:      Height:        Intake/Output Summary (Last 24 hours) at 08/19/2024 1024 Last data filed at 08/19/2024 9473 Gross per 24 hour  Intake --  Output 1450 ml  Net -1450 ml   Filed Weights   08/12/24 0600  Weight: 70.2 kg    Scheduled Meds:  apixaban   5 mg Oral BID   cloZAPine   50 mg Oral BID   collagenase    Topical Daily   diphenhydrAMINE -zinc   acetate   Topical BID   divalproex   250 mg Oral TID   gabapentin   100 mg Oral TID   polyethylene glycol  17 g Oral BID   senna-docusate  1 tablet Oral Daily   Continuous Infusions:  Nutritional status     Body mass index is 28.31 kg/m.  Data Reviewed:   CBC: Recent Labs  Lab 08/14/24 0433 08/15/24 0453 08/17/24 0457 08/18/24 0535 08/19/24 0534  WBC 15.2* 14.1* 12.5* 13.2* 14.7*  HGB 9.8* 10.2* 9.5* 10.0* 11.3*  HCT 30.3* 31.4* 30.1* 31.4* 36.9  MCV 78.7* 80.1 80.7 81.3 82.6  PLT 382 431* 477* 463* 492*   Basic Metabolic Panel: Recent Labs  Lab 08/13/24 1116 08/14/24 0433 08/15/24 0453 08/17/24 0457  NA 141 143 138 136  K 4.1 3.9 4.4 4.1  CL 105 106 104 103  CO2 23 24 17* 25  GLUCOSE 176* 114* 130* 112*  BUN 5* 5* 6 <5*  CREATININE 0.39* 0.32* 0.44 <0.30*  CALCIUM  8.4* 8.9 8.9 8.4*   GFR: CrCl cannot be calculated (This lab value cannot be used to calculate CrCl because it  is not a number: <0.30). Liver Function Tests: Recent Labs  Lab 08/14/24 0433  AST 17  ALT 11  ALKPHOS 46  BILITOT 0.6  PROT 5.8*  ALBUMIN 2.1*   No results for input(s): LIPASE, AMYLASE in the last 168 hours. No results for input(s): AMMONIA in the last 168 hours. Coagulation Profile: Recent Labs  Lab 08/13/24 1116  INR 1.1   Cardiac Enzymes: No results for input(s): CKTOTAL, CKMB, CKMBINDEX, TROPONINI in the last 168 hours. BNP (last 3 results) No results for input(s): PROBNP in the last 8760 hours. HbA1C: No results for input(s): HGBA1C in the last 72 hours. CBG: Recent Labs  Lab 08/18/24 2145 08/19/24 0925  GLUCAP 206* 247*   Lipid Profile: No results for input(s): CHOL, HDL, LDLCALC, TRIG, CHOLHDL, LDLDIRECT in the last 72 hours. Thyroid  Function Tests: No results for input(s): TSH, T4TOTAL, FREET4, T3FREE, THYROIDAB in the last 72 hours. Anemia Panel: No results for input(s): VITAMINB12, FOLATE, FERRITIN, TIBC,  IRON, RETICCTPCT in the last 72 hours. Sepsis Labs: Recent Labs  Lab 08/12/24 1216  LATICACIDVEN 3.8*    Recent Results (from the past 240 hours)  Blood culture (routine x 2)     Status: None   Collection Time: 08/12/24  1:41 AM   Specimen: BLOOD  Result Value Ref Range Status   Specimen Description BLOOD SITE NOT SPECIFIED  Final   Special Requests   Final    BOTTLES DRAWN AEROBIC AND ANAEROBIC Blood Culture results may not be optimal due to an inadequate volume of blood received in culture bottles   Culture   Final    NO GROWTH 5 DAYS Performed at [facility], [address] [place], [place] [address]    Report Status 08/17/2024 FINAL  Final  Blood culture (routine x 2)     Status: None   Collection Time: 08/12/24  2:43 AM   Specimen: BLOOD RIGHT ARM  Result Value Ref Range Status   Specimen Description BLOOD RIGHT ARM  Final   Special Requests   Final    BOTTLES DRAWN AEROBIC AND ANAEROBIC Blood Culture adequate volume   Culture   Final    NO GROWTH 5 DAYS Performed at [facility], [address] [place], [place] [address]    Report Status 08/17/2024 FINAL  Final  Culture, blood (Routine X 2) w Reflex to ID Panel     Status: None   Collection Time: 08/14/24  4:33 AM   Specimen: BLOOD  Result Value Ref Range Status   Specimen Description BLOOD SITE NOT SPECIFIED  Final   Special Requests   Final    BOTTLES DRAWN AEROBIC AND ANAEROBIC Blood Culture adequate volume   Culture   Final    NO GROWTH 5 DAYS Performed at [facility], [address] [place], [place] [address]    Report Status 08/19/2024 FINAL  Final  Culture, blood (Routine X 2) w Reflex to ID Panel     Status: None   Collection Time: 08/14/24  4:35 AM   Specimen: BLOOD  Result Value Ref Range Status   Specimen Description BLOOD SITE NOT SPECIFIED  Final   Special Requests   Final    BOTTLES DRAWN AEROBIC AND ANAEROBIC Blood Culture adequate volume   Culture   Final    NO  GROWTH 5 DAYS Performed at [facility], [address] [place], [place] [address]    Report Status 08/19/2024 FINAL  Final  Culture,  blood (Routine X 2) w Reflex to ID Panel     Status: Abnormal   Collection Time: 08/15/24  4:53 AM   Specimen: BLOOD  Result Value Ref Range Status   Specimen Description BLOOD SITE NOT SPECIFIED  Final   Special Requests   Final    BOTTLES DRAWN AEROBIC AND ANAEROBIC Blood Culture adequate volume   Culture  Setup Time   Final    GRAM POSITIVE COCCI AEROBIC BOTTLE ONLY CRITICAL RESULT CALLED TO, READ BACK BY AND VERIFIED WITH: PHARMD J LEDFORD 08/16/2024 @ 2302 BY AB    Culture (A)  Final    STAPHYLOCOCCUS CAPITIS THE SIGNIFICANCE OF ISOLATING THIS ORGANISM FROM A SINGLE SET OF BLOOD CULTURES WHEN MULTIPLE SETS ARE DRAWN IS UNCERTAIN. PLEASE NOTIFY THE MICROBIOLOGY DEPARTMENT WITHIN ONE WEEK IF SPECIATION AND SENSITIVITIES ARE REQUIRED. Performed at [facility], [address] [place], [place] [address]    Report Status 08/18/2024 FINAL  Final  Blood Culture ID Panel (Reflexed)     Status: Abnormal   Collection Time: 08/15/24  4:53 AM  Result Value Ref Range Status   Enterococcus faecalis NOT DETECTED NOT DETECTED Final   Enterococcus Faecium NOT DETECTED NOT DETECTED Final   Listeria monocytogenes NOT DETECTED NOT DETECTED Final   Staphylococcus species DETECTED (A) NOT DETECTED Final    Comment: CRITICAL RESULT CALLED TO, READ BACK BY AND VERIFIED WITH: PHARMD J LEDFORD 08/16/2024 @ 2302 BY AB    Staphylococcus aureus (BCID) NOT DETECTED NOT DETECTED Final   Staphylococcus epidermidis NOT DETECTED NOT DETECTED Final   Staphylococcus lugdunensis NOT DETECTED NOT DETECTED Final   Streptococcus species NOT DETECTED NOT DETECTED Final   Streptococcus agalactiae NOT DETECTED NOT DETECTED Final   Streptococcus pneumoniae NOT DETECTED NOT DETECTED Final   Streptococcus pyogenes NOT DETECTED NOT DETECTED Final    A.calcoaceticus-baumannii NOT DETECTED NOT DETECTED Final   Bacteroides fragilis NOT DETECTED NOT DETECTED Final   Enterobacterales NOT DETECTED NOT DETECTED Final   Enterobacter cloacae complex NOT DETECTED NOT DETECTED Final   Escherichia coli NOT DETECTED NOT DETECTED Final   Klebsiella aerogenes NOT DETECTED NOT DETECTED Final   Klebsiella oxytoca NOT DETECTED NOT DETECTED Final   Klebsiella pneumoniae NOT DETECTED NOT DETECTED Final   Proteus species NOT DETECTED NOT DETECTED Final   Salmonella species NOT DETECTED NOT DETECTED Final   Serratia marcescens NOT DETECTED NOT DETECTED Final   Haemophilus influenzae NOT DETECTED NOT DETECTED Final   Neisseria meningitidis NOT DETECTED NOT DETECTED Final   Pseudomonas aeruginosa NOT DETECTED NOT DETECTED Final   Stenotrophomonas maltophilia NOT DETECTED NOT DETECTED Final   Candida albicans NOT DETECTED NOT DETECTED Final   Candida auris NOT DETECTED NOT DETECTED Final   Candida glabrata NOT DETECTED NOT DETECTED Final   Candida krusei NOT DETECTED NOT DETECTED Final   Candida parapsilosis NOT DETECTED NOT DETECTED Final   Candida tropicalis NOT DETECTED NOT DETECTED Final   Cryptococcus neoformans/gattii NOT DETECTED NOT DETECTED Final    Comment: Performed at [facility], [address] [place], [place] [address]  Culture, blood (Routine X 2) w Reflex to ID Panel     Status: None (Preliminary result)   Collection Time: 08/15/24  4:54 AM   Specimen: BLOOD  Result Value Ref Range Status   Specimen Description BLOOD SITE NOT SPECIFIED  Final   Special Requests   Final    BOTTLES DRAWN AEROBIC AND ANAEROBIC Blood Culture adequate volume   Culture  Final    NO GROWTH 4 DAYS Performed at [facility], [address], [place], [place] [address]    Report Status PENDING  Incomplete         Radiology Studies: MR CERVICAL SPINE WO CONTRAST Result Date: 08/18/2024 EXAM: MRI CERVICAL SPINE WITHOUT CONTRAST 08/17/2024  07:16:52 PM TECHNIQUE: Multiplanar multisequence MRI of the cervical spine was performed. COMPARISON: MRI CERVICAL AND THORACIC SPINE WITHOUT CONTRAST 05/23/2024. CLINICAL HISTORY: Neck trauma, ligament injury suspected (Age >= 16y). FINDINGS: Motion limited study, particularly on the axial sequences and sagittal STIR sequence. Within this limitation: BONES AND ALIGNMENT: Normal alignment. Known cervical spine fractures characterized on prior CT of the cervical spine not well evaluated on this study. No new marrow edema to suggest interval fracture. SPINAL CORD: More discrete T2 hyperintensity at C3-C4 with cord volume loss, compatible with myelomalacia. SOFT TISSUES: Persistent, but improved edema in the interspinous ligaments at C3-C4 to C6-C7, compatible with strain. DISC LEVELS: Mild to moderate canal stenosis at C5-C6. Mild canal stenosis at C4-C5 and C6-C7. Nondiagnostic evaluation for foraminal stenosis due to motion on the axial sequences. IMPRESSION: 1. More discrete T2 hyperintensity at C3-C4 with cord volume loss, compatible with myelomalacia. 2. Persistent, but improved edema in the interspinous ligaments at C3-C4 to C6-C7, compatible with injury/strain. 3. Known cervical spine fractures characterized on prior CT of the cervical spine not well evaluated on this study. Normal alignment. 4. Mild to moderate canal stenosis at C5-C6. Mild canal stenosis at C4-C5 and C6-C7. 5. Nondiagnostic evaluation for foraminal stenosis due to motion on the axial sequences. Electronically signed by: Gilmore Molt 08/18/2024 03:35 AM EST RP Workstation: [id number]   MR THORACIC SPINE WO CONTRAST Result Date: 08/18/2024 EXAM: MRI THORACIC SPINE WITHOUT INTRAVENOUS CONTRAST 08/17/2024 07:15:12 PM TECHNIQUE: Multiplanar multisequence MRI of the thoracic spine was performed without the administration of intravenous contrast. COMPARISON: MRI CERVICAL AND THORACIC SPINE WITHOUT CONTRAST 05/23/2024. CLINICAL HISTORY: Back  trauma, abnormal neuro exam, CT or xray positive (Age >= 16y). FINDINGS: Moderately motion limited study.  Within this limitation: BONES AND ALIGNMENT: Healing T4, T5 and T6 fractures with decreased marrow edema. Similar extension into the posterior elements at T4. No change in alignment. SPINAL CORD: No obvious cord signal abnormality on motion limited assessment. SOFT TISSUES: Unremarkable.  No paraspinal edema. DEGENERATIVE CHANGES: Motion limits assessment but the bony retropulsion at T4-T5 appears similar to prior with mild mass effect on the cord. No high grade foraminal stenosis. Mild canal stenosis at multiple levels. IMPRESSION: 1. Healing T4, T5 and T6 fractures with decreased marrow edema. Similar extension into the posterior elements at T4. 2. Similar alignment. Bony retropulsion at T4-T5 appears similar to prior with mild mass effect on the cord. 3. Moderately motion limited study. Electronically signed by: Gilmore Molt 08/18/2024 03:15 AM EST RP Workstation: [id number]           LOS: 7 days   Time spent= 35 mins    Deliliah Room, MD [facility]  If 7PM-7AM, please contact night-coverage  08/19/2024, 10:24 AM

## 2024-08-19 NOTE — Plan of Care (Signed)
   Problem: Nutrition: Goal: Adequate nutrition will be maintained Outcome: Progressing   Problem: Coping: Goal: Level of anxiety will decrease Outcome: Progressing   Problem: Elimination: Goal: Will not experience complications related to bowel motility Outcome: Progressing Goal: Will not experience complications related to urinary retention Outcome: Progressing

## 2024-08-19 NOTE — Plan of Care (Signed)

## 2024-08-20 ENCOUNTER — Inpatient Hospital Stay (HOSPITAL_COMMUNITY)

## 2024-08-20 LAB — URINALYSIS, ROUTINE W REFLEX MICROSCOPIC
Bilirubin Urine: NEGATIVE
Glucose, UA: NEGATIVE mg/dL
Hgb urine dipstick: NEGATIVE
Ketones, ur: 5 mg/dL — AB
Leukocytes,Ua: NEGATIVE
Nitrite: NEGATIVE
Protein, ur: NEGATIVE mg/dL
Specific Gravity, Urine: 1.017 (ref 1.005–1.030)
pH: 7 (ref 5.0–8.0)

## 2024-08-20 LAB — CULTURE, BLOOD (ROUTINE X 2)
Culture: NO GROWTH
Special Requests: ADEQUATE

## 2024-08-20 LAB — GLUCOSE, CAPILLARY
Glucose-Capillary: 145 mg/dL — ABNORMAL HIGH (ref 70–99)
Glucose-Capillary: 236 mg/dL — ABNORMAL HIGH (ref 70–99)
Glucose-Capillary: 202 mg/dL — ABNORMAL HIGH (ref 70–99)
Glucose-Capillary: 209 mg/dL — ABNORMAL HIGH (ref 70–99)

## 2024-08-20 MED ORDER — ORAL CARE MOUTH RINSE: 15.0000 mL | OROMUCOSAL | Status: DC | PRN

## 2024-08-20 NOTE — Progress Notes (Signed)
 Progress Note   Patient: Alexandria Harvey FMW:[id number] DOB: [birth year] DOA: 08/11/2024     8 DOS: the patient was seen and examined on 08/20/2024   Brief hospital course: 43 year old female- Under guardianship of care for DSS-phone #[phone]--- apparently also has a caregiver [name]/[name] who is the group home, DM, mental retardation, Schizoaffective disorder, previous hallucination, sacral decubitus ulcer, cervical fracture with collar - p/w fever and encephalopathy.   Assessment and Plan: Sepsis, unclear etiology Presented with leukocytosis, lactic acidosis, concern for sepsis.  She has been afebrile here so far off antibiotics since 12/6. Blood cultures done off antibiotics on 12/8 and 12/9 NGTD  Got empiric antibiotics initiated at presentation.   -Evaluated by ID, Antibiotics stopped.  Currently on line holiday.   Autoimmune workup unremarkable with negative ANA, c-ANCA, p-ANCA, RF, Sjogren syndrome antibody A and B negative, negative lupus anticoagulant  -febrile this AM to 100.51F. Ordered and reviewed CXR, unremarkable. Repeat blood cx pending    Acute metabolic encephalopathy, resolved Possible polypharmacy, dehydration, ? sepsis, in the setting of intellectual delay. Medications weaned back.  Mental status noted to return back to baseline. Delirium precautions.   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   Evaluated by infectious disease.  Only minimal concern for infectious etiology, other differentials include Libman-Sacks, myxoma, clot.   Autoimmune workup so far negative (negative ANA, RA, ANCA).  Negative lupus anticoagulant panel.   Follow-up blood cultures closely.  Cardiology team favoring repeat TTE echo in 3 months after she has been on anticoagulation to evaluate need for TEE Continue eliquis  therapy.   Acute rash Unclear etiology, thought to be possible drug reaction versus excoriations from bedbugs.  Antibiotics stopped.  Contact precautions.   Continue monitor closely.     Unstageable sacral ulcer (POA) Was previously on vancomycin  prior to visit to ED.  Wound evaluated by wound care.  Wound care per their recommendations.   Diabetes mellitus type 2 Continue accucheks, Insulin  sliding scale per floor protocol.   History of cervical fractures with polytrauma Pt had been in c-collar.  MRI C and T spine were noted to be reassuring. Ordered to remove C-spine collar as per neurosurgeon, Dr Valeda recommendations mentioned in his note on 12/12.   Acute DVT BL LE Lower extremity Dopplers noting BL proximal femoral vein DVTs.  Continue Eliquis .  Unclear if provoked or unprovoked.  Hypercoagulable workup, so far negative.   Severe constipation Noted incidentally on CT scan.  Continue bowel regimen. Encourage out of bed to chair.   Cognitive deficits/schizophrenia Resides at a group home.  guardianship of care for DSS-phone #[phone]--- apparently also has a caregiver [name]/[name] who is the group home.   Patient evaluated by psychiatry.  Medication recommendations per psych.         Subjective: unable to assess given mentation  Physical Exam: Vitals:   08/20/24 0750 08/20/24 0946 08/20/24 1202 08/20/24 1608  BP: 110/73 (!) 134/90 (!) 134/91 (!) 156/93  Pulse: (!) 111 (!) 109 (!) 109 (!) 105  Resp: 18 18 18 18   Temp: (!) 100.7 F (38.2 C) 99.6 F (37.6 C) 98.4 F (36.9 C) 98.1 F (36.7 C)  TempSrc: Axillary Axillary Oral Oral  SpO2: 97% 98% 95% 99%  Weight:      Height:       General exam: Awake, laying in bed, in nad Respiratory system: Normal respiratory effort, no wheezing Cardiovascular system: regular rate, s1, s2 Gastrointestinal system: Soft, nondistended, positive BS Central nervous  system: not tremors, no seizures Extremities: Perfused, no clubbing Skin: Normal skin turgor, no notable skin lesions seen Psychiatry: unable to assess given mentation  Data Reviewed:  There are no new results to  review at this time.  Family Communication: Pt in room, family not at bedside  Disposition: Status is: Inpatient Remains inpatient appropriate because: severity of illness  Planned Discharge Destination: Skilled nursing facility    Author: Garnette Pelt, MD 08/20/2024 5:01 PM  For on call review [email or web address].

## 2024-08-20 NOTE — Hospital Course (Signed)
 43 year old female- Under guardianship of care for DSS-phone #[phone]--- apparently also has a caregiver Alexandria Harvey/[name] who is the group home, DM, mental retardation, Schizoaffective disorder, previous hallucination, sacral decubitus ulcer, cervical fracture with collar - p/w fever and encephalopathy.

## 2024-08-20 NOTE — Plan of Care (Signed)

## 2024-08-21 LAB — CBC
HCT: 33.3 % — ABNORMAL LOW (ref 36.0–46.0)
Hemoglobin: 10.8 g/dL — ABNORMAL LOW (ref 12.0–15.0)
MCH: 25.5 pg — ABNORMAL LOW (ref 26.0–34.0)
MCHC: 32.4 g/dL (ref 30.0–36.0)
MCV: 78.5 fL — ABNORMAL LOW (ref 80.0–100.0)
Platelets: 391 K/uL (ref 150–400)
RBC: 4.24 MIL/uL (ref 3.87–5.11)
RDW: 19.1 % — ABNORMAL HIGH (ref 11.5–15.5)
WBC: 17.3 K/uL — ABNORMAL HIGH (ref 4.0–10.5)
nRBC: 0 % (ref 0.0–0.2)

## 2024-08-21 LAB — COMPREHENSIVE METABOLIC PANEL WITH GFR
ALT: 11 U/L (ref 0–44)
AST: 20 U/L (ref 15–41)
Albumin: 2.3 g/dL — ABNORMAL LOW (ref 3.5–5.0)
Alkaline Phosphatase: 59 U/L (ref 38–126)
Anion gap: 12 (ref 5–15)
BUN: 6 mg/dL (ref 6–20)
CO2: 21 mmol/L — ABNORMAL LOW (ref 22–32)
Calcium: 8.7 mg/dL — ABNORMAL LOW (ref 8.9–10.3)
Chloride: 95 mmol/L — ABNORMAL LOW (ref 98–111)
Creatinine, Ser: 0.37 mg/dL — ABNORMAL LOW (ref 0.44–1.00)
GFR, Estimated: >60 mL/min (ref >60)
Glucose, Bld: 188 mg/dL — ABNORMAL HIGH (ref 70–99)
Potassium: 4.8 mmol/L (ref 3.5–5.1)
Sodium: 128 mmol/L — ABNORMAL LOW (ref 135–145)
Total Bilirubin: 0.4 mg/dL (ref 0.0–1.2)
Total Protein: 6.8 g/dL (ref 6.5–8.1)

## 2024-08-21 LAB — OSMOLALITY: Osmolality: 297 mosm/kg — ABNORMAL HIGH (ref 275–295)

## 2024-08-21 LAB — GLUCOSE, CAPILLARY
Glucose-Capillary: 177 mg/dL — ABNORMAL HIGH (ref 70–99)
Glucose-Capillary: 229 mg/dL — ABNORMAL HIGH (ref 70–99)
Glucose-Capillary: 236 mg/dL — ABNORMAL HIGH (ref 70–99)
Glucose-Capillary: 211 mg/dL — ABNORMAL HIGH (ref 70–99)

## 2024-08-21 LAB — SODIUM, URINE, RANDOM: Sodium, Ur: 51 mmol/L

## 2024-08-21 LAB — OSMOLALITY, URINE: Osmolality, Ur: 358 mosm/kg (ref 300–900)

## 2024-08-21 NOTE — Plan of Care (Signed)

## 2024-08-21 NOTE — Progress Notes (Addendum)
 Progress Note   Patient: Alexandria Harvey FMW:[id number] DOB: [birth year] DOA: 08/11/2024     9 DOS: the patient was seen and examined on 08/21/2024   Brief hospital course: 43 year old female- Under guardianship of care for DSS-phone #[phone]--- apparently also has a caregiver [name]/[name] who is the group home, DM, mental retardation, Schizoaffective disorder, previous hallucination, sacral decubitus ulcer, cervical fracture with collar - p/w fever and encephalopathy.   Assessment and Plan: Sepsis, unclear etiology Presented with leukocytosis, lactic acidosis, concern for sepsis.  She has been afebrile here so far off antibiotics since 12/6. Blood cultures done off antibiotics on 12/8 and 12/9 NGTD  Got empiric antibiotics initiated at presentation.   -Evaluated by ID, Antibiotics stopped.  Currently on line holiday.   Autoimmune workup unremarkable with negative ANA, c-ANCA, p-ANCA, RF, Sjogren syndrome antibody A and B negative, negative lupus anticoagulant  -febrile on 12/14 to 100.35F. Ordered and reviewed CXR, unremarkable. Repeat blood cx pending, neg thus far -now afebrile    Acute metabolic encephalopathy, resolved Possible polypharmacy, dehydration, ? sepsis, in the setting of intellectual delay. Medications weaned back.  Mental status noted to return back to baseline. Delirium precautions.   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   Evaluated by infectious disease.  Only minimal concern for infectious etiology, other differentials include Libman-Sacks, myxoma, clot.   Autoimmune workup so far negative (negative ANA, RA, ANCA).  Negative lupus anticoagulant panel.   Follow-up blood cultures closely.  Cardiology team favoring repeat TTE echo in 3 months after she has been on anticoagulation to evaluate need for TEE Continue eliquis  therapy.   Acute rash Unclear etiology, thought to be possible drug reaction versus excoriations from bedbugs.  Antibiotics  stopped.  Contact precautions.  Continue monitor closely.     Unstageable sacral ulcer (POA) Was previously on vancomycin  prior to visit to ED.  Wound evaluated by wound care.  Wound care per their recommendations.   Diabetes mellitus type 2 Continue accucheks, Insulin  sliding scale per floor protocol.   History of cervical fractures with polytrauma Pt had been in c-collar.  MRI C and T spine were noted to be reassuring. Ordered to remove C-spine collar as per neurosurgeon, Dr Valeda recommendations mentioned in his note on 12/12.   Acute DVT BL LE Lower extremity Dopplers noting BL proximal femoral vein DVTs.  Continue Eliquis .  Unclear if provoked or unprovoked.  Hypercoagulable workup, so far negative.   Severe constipation Noted incidentally on CT scan.  Continue bowel regimen. Encourage out of bed to chair.   Cognitive deficits/schizophrenia Resides at a group home.  guardianship of care for DSS-phone #[phone]--- apparently also has a caregiver [name]/[name] who is the group home.   Patient evaluated by psychiatry.  Medication recommendations per psych.   Hyponatremia -Will check urine and serum osm, urine Na -recheck bmet in AM -family reports that pt is taking in good amount of PO intake      Subjective: cannot assess given mentation  Physical Exam: Vitals:   08/21/24 0114 08/21/24 0511 08/21/24 0825 08/21/24 1156  BP: (!) 151/109 (!) 157/107 (!) 143/95 (!) 134/94  Pulse: (!) 114 (!) 106 (!) 109 (!) 113  Resp:   17 18  Temp: 97.7 F (36.5 C) 98.1 F (36.7 C) 98.1 F (36.7 C) 98 F (36.7 C)  TempSrc:      SpO2: 100% 98% 98% 98%  Weight:      Height:       General  exam: laying in bed, in no acute distress Respiratory system: normal chest rise, clear, no audible wheezing Cardiovascular system: regular rhythm, s1-s2 Gastrointestinal system: Nondistended, nontender, pos BS Central nervous system: No seizures, no tremors Extremities: No cyanosis, no  joint deformities Skin: No rashes, no pallor Psychiatry: Unable to assess given mentation  Data Reviewed:  Labs reviewed: Na 128, K 4.8, Cr 0.37, WBC 17.3, Hgb 10.8, Plts 391  Family Communication: Pt in room, family at bedside  Disposition: Status is: Inpatient Remains inpatient appropriate because: severity of illness  Planned Discharge Destination: Skilled nursing facility    Author: Garnette Pelt, MD 08/21/2024 4:21 PM  For on call review [email or web address].

## 2024-08-21 NOTE — Consult Note (Signed)
 WOC Nurse wound follow up Wound type: pressure injury to posterior head, superior wound now a stage 3, inferior wound remains unstageable  Measurement:  Superior wound: 1.5 x 1 Inferior wound: 2 cm x 1 cm Wound bed: Superior: pink/red, dry Inferior: 100% covered with yellow slough Drainage (amount, consistency, odor) serosanguinous Periwound: intact Dressing procedure/placement/frequency:  Continue current wound care as follows:  Cleanse wounds with Vashe (lawson # [id number]) allow to air dry. Apply 1/4 thick layer of Santyl  to wound bed, top with saline moist gauze. Top with silicone foam.  Ok to lift foam daily for change of gauze and reapplication of Santyl . Ok to change silicone foam every 3 days.      WOC Nurse team will follow with you and see patient within 10 days for wound assessments.  Please notify WOC nurses of any acute changes in the wounds or any new areas of concern Thank you,  Doyal Polite, MSN, RN, [facility] WOC Team [phone] (Available Mon-Fri 0700-1500)

## 2024-08-22 LAB — COMPREHENSIVE METABOLIC PANEL WITH GFR
ALT: 12 U/L (ref 0–44)
AST: 17 U/L (ref 15–41)
Albumin: 2.2 g/dL — ABNORMAL LOW (ref 3.5–5.0)
Alkaline Phosphatase: 63 U/L (ref 38–126)
Anion gap: 16 — ABNORMAL HIGH (ref 5–15)
BUN: <5 mg/dL — ABNORMAL LOW (ref 6–20)
CO2: 19 mmol/L — ABNORMAL LOW (ref 22–32)
Calcium: 9.4 mg/dL (ref 8.9–10.3)
Chloride: 96 mmol/L — ABNORMAL LOW (ref 98–111)
Creatinine, Ser: 0.36 mg/dL — ABNORMAL LOW (ref 0.44–1.00)
GFR, Estimated: >60 mL/min (ref >60)
Glucose, Bld: 177 mg/dL — ABNORMAL HIGH (ref 70–99)
Potassium: 4.4 mmol/L (ref 3.5–5.1)
Sodium: 131 mmol/L — ABNORMAL LOW (ref 135–145)
Total Bilirubin: 0.6 mg/dL (ref 0.0–1.2)
Total Protein: 6.9 g/dL (ref 6.5–8.1)

## 2024-08-22 LAB — GLUCOSE, CAPILLARY
Glucose-Capillary: 204 mg/dL — ABNORMAL HIGH (ref 70–99)
Glucose-Capillary: 128 mg/dL — ABNORMAL HIGH (ref 70–99)
Glucose-Capillary: 214 mg/dL — ABNORMAL HIGH (ref 70–99)

## 2024-08-22 MED ORDER — OXYCODONE HCL 5 MG PO TABS
2.5000 mg | ORAL_TABLET | ORAL | Status: DC | PRN
  Administered 2024-08-23 – 2024-08-31 (×11): 2.5 mg via ORAL
  Filled 2024-08-22 (×11): qty 1

## 2024-08-22 NOTE — Plan of Care (Signed)

## 2024-08-22 NOTE — Progress Notes (Signed)
 Occupational Therapy Treatment Patient Details Name: Alexandria Harvey MRN: [id number] DOB: November 08, 1980 Today's Date: 08/22/2024   History of present illness 43 y.o. female admitted 08/11/24 for petechial rash suspect adverse drug reaction to Zosyn . 12/6 (+) DVT in BLE. PMHx: intellectual disability, schizophrenia, dropped head syndrome, HTN, seizures, diabetes, hx of C3, C5, C7 fxs with central cord syndrome, hx of T4-T5 fxs, and L foot/ankle fx.   OT comments  Patient received in supine and agreeable to OT session with encouragement. ROM performed to BUEs with HOB elevated with patient having complaints of pain with LUE initially but decreased with decreased tone. Patient demonstrating limited AROM with BUE shoulder, hand, and elbow. Self feeding performed with patient having difficulty managing cup and required hand over hand to bring utensil to mouth with red foam to build up utensils. Patient became increasingly lethargic during session.  Patient will benefit from continued inpatient follow up therapy, <3 hours/day.  Acute OT to continue to follow to address established goals to facilitate DC to next venue of care.        If plan is discharge home, recommend the following:  Two people to help with walking and/or transfers;Two people to help with bathing/dressing/bathroom;Assistance with cooking/housework;Assistance with feeding;Direct supervision/assist for medications management;Direct supervision/assist for financial management;Assist for transportation;Help with stairs or ramp for entrance;Supervision due to cognitive status   Equipment Recommendations  Other (comment) (defer to next level of care)    Recommendations for Other Services      Precautions / Restrictions Precautions Precautions: Cervical;Fall Precaution Booklet Issued: No Recall of Precautions/Restrictions: Impaired Required Braces or Orthoses: Cervical Brace Cervical Brace: Hard collar;At all times Restrictions Weight  Bearing Restrictions Per Provider Order: No       Mobility Bed Mobility                    Transfers                         Balance                                           ADL either performed or assessed with clinical judgement   ADL Overall ADL's : Needs assistance/impaired Eating/Feeding: Maximal assistance;With adaptive utensils;Bed level Eating/Feeding Details (indicate cue type and reason): HOB elevated and red foam used to build up utencils. Patient required hand over hand to bring spoon to mouth, total assist with cup Grooming: Wash/dry face;Maximal assistance Grooming Details (indicate cue type and reason): hand over hand                               General ADL Comments: unable to hold cup and required hand over hand with built up utensil to feed self    Extremity/Trunk Assessment              Vision       Perception     Praxis     Communication Communication Communication: Impaired Factors Affecting Communication: Reduced clarity of speech;Other (comment)   Cognition Arousal: Lethargic Behavior During Therapy: WFL for tasks assessed/performed, Flat affect, Anxious Cognition: History of cognitive impairments             OT - Cognition Comments: alert initially but became more lethargic during session  Following commands: Impaired Following commands impaired: Only follows one step commands consistently, Follows one step commands with increased time, Follows multi-step commands inconsistently      Cueing   Cueing Techniques: Verbal cues, Gestural cues, Tactile cues, Visual cues  Exercises Exercises: General Upper Extremity General Exercises - Upper Extremity Shoulder Flexion: AAROM, PROM, Supine, Strengthening, 10 reps, Both Shoulder ABduction: PROM, AAROM, Both, 10 reps, Supine Elbow Flexion: PROM, AAROM, Both, 10 reps, Supine Elbow Extension: PROM, AAROM, Both, 10  reps, Supine Wrist Flexion: PROM, AAROM, Both, 10 reps, Supine Wrist Extension: PROM, AAROM, Both, 10 reps, Supine Digit Composite Flexion: PROM, AROM, Both, 10 reps, Supine Composite Extension: PROM, AAROM, Both, 10 reps, Supine    Shoulder Instructions       General Comments pain with ROM to LUE    Pertinent Vitals/ Pain       Pain Assessment Pain Assessment: Faces Faces Pain Scale: Hurts little more Pain Location: LUE with ROM Pain Descriptors / Indicators: Aching, Discomfort, Grimacing, Moaning Pain Intervention(s): Limited activity within patient's tolerance, Monitored during session, Repositioned  Home Living                                          Prior Functioning/Environment              Frequency  Min 1X/week        Progress Toward Goals  OT Goals(current goals can now be found in the care plan section)  Progress towards OT goals: Progressing toward goals  Acute Rehab OT Goals Patient Stated Goal: to rest OT Goal Formulation: With patient Time For Goal Achievement: 09/01/24 Potential to Achieve Goals: Good ADL Goals Pt Will Perform Eating: with max assist;bed level (with HOB elevated and with adaptive equipment as needed) Pt Will Perform Grooming: with max assist;bed level (wash and dry face; with HOB elevated; with adaptive equipment as needed) Pt/caregiver will Perform Home Exercise Program: Increased ROM;Increased strength;Both right and left upper extremity;With minimal assist;With written HEP provided (AAROM/AROM) Additional ADL Goal #1: Patient will demonstrate ability to use R UE to grasp 1 to 2 foam cubes of various densities and successfully release cubes into a target area on tabletop in 4/5 attempts with Min assist to increase strength and coordination for carryover to functional tasks.  Plan      Co-evaluation                 AM-PAC OT 6 Clicks Daily Activity     Outcome Measure   Help from another person  eating meals?: A Lot Help from another person taking care of personal grooming?: Total Help from another person toileting, which includes using toliet, bedpan, or urinal?: Total Help from another person bathing (including washing, rinsing, drying)?: Total Help from another person to put on and taking off regular upper body clothing?: Total Help from another person to put on and taking off regular lower body clothing?: Total 6 Click Score: 7    End of Session Equipment Utilized During Treatment: Other (comment) (red foam to build up utencils)  OT Visit Diagnosis: Other abnormalities of gait and mobility (R26.89);Muscle weakness (generalized) (M62.81);Ataxia, unspecified (R27.0)   Activity Tolerance Patient limited by lethargy   Patient Left in bed;with call bell/phone within reach   Nurse Communication Mobility status        Time: 8743-8672 OT Time Calculation (min): 31 min  Charges: OT  General Charges $OT Visit: 1 Visit OT Treatments $Self Care/Home Management : 8-22 mins $Therapeutic Exercise: 8-22 mins  Dick Laine, OTA Acute Rehabilitation Services  Office [phone]   Jeb LITTIE Laine 08/22/2024, 1:45 PM

## 2024-08-22 NOTE — Progress Notes (Signed)
 Progress Note   Patient: Alexandria Harvey FMW:[id number] DOB: [birth year] DOA: 08/11/2024     10 DOS: the patient was seen and examined on 08/22/2024   Brief hospital course: 43 year old female- Under guardianship of care for DSS-phone #[phone]--- apparently also has a caregiver [name]/[name] who is the group home, DM, mental retardation, Schizoaffective disorder, previous hallucination, sacral decubitus ulcer, cervical fracture with collar - p/w fever and encephalopathy.   Assessment and Plan: Sepsis, unclear etiology Presented with leukocytosis, lactic acidosis, concern for sepsis.  She has been afebrile here so far off antibiotics since 12/6. Blood cultures done off antibiotics on 12/8 and 12/9 NGTD  Got empiric antibiotics initiated at presentation.   -Evaluated by ID, Antibiotics stopped.  Currently on line holiday.   Autoimmune workup unremarkable with negative ANA, c-ANCA, p-ANCA, RF, Sjogren syndrome antibody A and B negative, negative lupus anticoagulant  -febrile on 12/14 to 100.39F. Ordered and reviewed CXR, unremarkable. Repeat blood cx pending, neg thus far -Currently remains afebrile    Acute metabolic encephalopathy, resolved Possible polypharmacy, dehydration, ? sepsis, in the setting of intellectual delay. Medications weaned back.  Mental status noted to return back to baseline. Delirium precautions.   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   Evaluated by infectious disease.  Only minimal concern for infectious etiology, other differentials include Libman-Sacks, myxoma, clot.   Autoimmune workup so far negative (negative ANA, RA, ANCA).  Negative lupus anticoagulant panel.   Follow-up blood cultures closely.  Cardiology team favoring repeat TTE echo in 3 months after she has been on anticoagulation to evaluate need for TEE Continue eliquis  therapy.   Acute rash Unclear etiology, thought to be possible drug reaction versus excoriations from bedbugs.   Antibiotics stopped.  Contact precautions.  Continue monitor closely.     Unstageable sacral ulcer (POA) Was previously on vancomycin  prior to visit to ED.  Wound evaluated by wound care.  Wound care per their recommendations.   Diabetes mellitus type 2 Continue accucheks, Insulin  sliding scale per floor protocol.   History of cervical fractures with polytrauma Pt had been in c-collar.  MRI C and T spine were noted to be reassuring. Ordered to remove C-spine collar as per neurosurgeon, Dr Valeda recommendations mentioned in his note on 12/12.   Acute DVT BL LE Lower extremity Dopplers noting BL proximal femoral vein DVTs.  Continue Eliquis .  Unclear if provoked or unprovoked.  Hypercoagulable workup, so far negative.   Severe constipation Noted incidentally on CT scan.  Continue bowel regimen. Encourage out of bed to chair.   Cognitive deficits/schizophrenia Resides at a group home.  guardianship of care for DSS-phone #[phone]--- apparently also has a caregiver [name]/[name] who is the group home.   Patient evaluated by psychiatry.  Medication recommendations per psych.   Hyponatremia -improved this AM -cont to encourage po intake      Subjective: Unable to assess given mentation  Physical Exam: Vitals:   08/22/24 0018 08/22/24 0512 08/22/24 0805 08/22/24 1149  BP: (!) 107/54 126/81 105/70 126/83  Pulse: (!) 111 (!) 109 (!) 103 (!) 106  Resp: 18  19 18   Temp: 98.1 F (36.7 C) 98.8 F (37.1 C)    TempSrc: Oral     SpO2: 99% 99% 96% 97%  Weight:      Height:       General exam: laying in bed, in nad, yells out when gently touching LLE Respiratory system: Normal respiratory effort, no wheezing Cardiovascular system: regular rate,  s1, s2 Gastrointestinal system: Soft, nondistended, positive BS Central nervous system: no seizures, no tremors Extremities: Perfused, no clubbing Skin: Normal skin turgor, no notable skin lesions seen Psychiatry: unable to assess  given mentation  Data Reviewed:  Labs reviewed: Na 131, K 4.4, Cr 0.36  Family Communication: Pt in room, family not at bedside  Disposition: Status is: Inpatient Remains inpatient appropriate because: severity of illness  Planned Discharge Destination: Skilled nursing facility    Author: Garnette Pelt, MD 08/22/2024 2:06 PM  For on call review [email or web address].

## 2024-08-23 DIAGNOSIS — R233 Spontaneous ecchymoses: Secondary | ICD-10-CM | POA: Diagnosis not present

## 2024-08-23 DIAGNOSIS — A419 Sepsis, unspecified organism: Secondary | ICD-10-CM | POA: Diagnosis not present

## 2024-08-23 LAB — GLUCOSE, CAPILLARY
Glucose-Capillary: 122 mg/dL — ABNORMAL HIGH (ref 70–99)
Glucose-Capillary: 260 mg/dL — ABNORMAL HIGH (ref 70–99)
Glucose-Capillary: 223 mg/dL — ABNORMAL HIGH (ref 70–99)
Glucose-Capillary: 221 mg/dL — ABNORMAL HIGH (ref 70–99)

## 2024-08-23 NOTE — Progress Notes (Signed)
 See photo uploaded

## 2024-08-23 NOTE — Progress Notes (Signed)
 Progress Note   Patient: Alexandria Harvey FMW:[id number] DOB: 05-26-1981 DOA: 08/11/2024     11 DOS: the patient was seen and examined on 08/23/2024   Brief hospital course: 43 year old female- Under guardianship of care for DSS-phone #[phone]--- apparently also has a caregiver [name]/[name] who is the group home, DM, mental retardation, Schizoaffective disorder, previous hallucination, sacral decubitus ulcer, cervical fracture with collar - p/w fever and encephalopathy.   Assessment and Plan: Sepsis, unclear etiology Presented with leukocytosis, lactic acidosis, concern for sepsis.  She has been afebrile here so far off antibiotics since 12/6. Blood cultures done off antibiotics on 12/8 and 12/9 NGTD  Got empiric antibiotics initiated at presentation.   -Evaluated by ID, Antibiotics stopped.  Currently on line holiday.   Autoimmune workup unremarkable with negative ANA, c-ANCA, p-ANCA, RF, Sjogren syndrome antibody A and B negative, negative lupus anticoagulant  -febrile on 12/14 to 100.77F. Ordered and reviewed CXR, unremarkable. Repeat blood cx pending, neg thus far. Pt has since remained afebrile thus far    Acute metabolic encephalopathy, resolved Possible polypharmacy, dehydration, ? sepsis, in the setting of intellectual delay. Medications weaned back.  Mental status noted to return back to baseline. Delirium precautions.   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   Evaluated by infectious disease.  Only minimal concern for infectious etiology, other differentials include Libman-Sacks, myxoma, clot.   Autoimmune workup so far negative (negative ANA, RA, ANCA).  Negative lupus anticoagulant panel.   Follow-up blood cultures closely.  Cardiology team favoring repeat TTE echo in 3 months after she has been on anticoagulation to evaluate need for TEE Continue eliquis  therapy.   Acute rash Unclear etiology, thought to be possible drug reaction versus excoriations from  bedbugs.  Antibiotics stopped.  Contact precautions.  Continue monitor closely.     Unstageable sacral ulcer (POA) Wound 06/11/24 Pressure Injury Sacrum Mid Unstageable - Full thickness tissue loss in which the base of the injury is covered by slough (yellow, tan, gray, green or brown) and/or eschar (tan, brown or black) in the wound bed. (Active)     Wound 08/16/24 0259 Pressure Injury Head Posterior Unstageable - Full thickness tissue loss in which the base of the injury is covered by slough (yellow, tan, gray, green or brown) and/or eschar (tan, brown or black) in the wound bed. (Active)    Was previously on vancomycin  prior to visit to ED.  Wound evaluated by wound care.  Wound care per their recommendations.   Diabetes mellitus type 2 Continue accucheks, Insulin  sliding scale per floor protocol.   History of cervical fractures with polytrauma Pt had been in c-collar.  MRI C and T spine were noted to be reassuring. Ordered to remove C-spine collar as per neurosurgeon, Dr Valeda recommendations mentioned in his note on 12/12.   Acute DVT BL LE Lower extremity Dopplers noting BL proximal femoral vein DVTs.  Continue Eliquis .  Unclear if provoked or unprovoked.  Hypercoagulable workup, so far negative.   Severe constipation Noted incidentally on CT scan.  Continue bowel regimen. Encourage out of bed to chair.   Cognitive deficits/schizophrenia Resides at a group home.  guardianship of care for DSS-phone #[phone]--- apparently also has a caregiver [name]/[name] who is the group home.   Patient evaluated by psychiatry.  Medication recommendations per psych.   Hyponatremia -improved this AM -cont to encourage po intake      Subjective: Complaining of L foot pain  Physical Exam: Vitals:   08/23/24  0000 08/23/24 0508 08/23/24 0806 08/23/24 1233  BP: 115/77 109/75 117/78 124/81  Pulse: (!) 101 98 (!) 102 (!) 110  Resp: 18 20 16 16   Temp: 98.7 F (37.1 C) 98.8 F (37.1  C) 98.2 F (36.8 C) 97.6 F (36.4 C)  TempSrc: Oral Oral    SpO2: 94% 94% 99% 98%  Weight:      Height:       General exam: Conversant, in no acute distress Respiratory system: normal chest rise, clear, no audible wheezing Cardiovascular system: regular rhythm, s1-s2 Gastrointestinal system: Nondistended, nontender, pos BS Central nervous system: No seizures, no tremors Extremities: No cyanosis, no joint deformities Skin: no rashes, decub wound under L heel Psychiatry: Affect normal // no auditory hallucinations   Data Reviewed:  There are no new results to review at this time.  Family Communication: Pt in room, family at bedside  Disposition: Status is: Inpatient Remains inpatient appropriate because: severity of illness  Planned Discharge Destination: Skilled nursing facility    Author: Garnette Pelt, MD 08/23/2024 2:52 PM  For on call review [email or web address].

## 2024-08-23 NOTE — Plan of Care (Signed)

## 2024-08-23 NOTE — Progress Notes (Signed)
 See picture

## 2024-08-23 NOTE — TOC Progression Note (Signed)
 Transition of Care [facility]) - Progression Note    Patient Details  Name: Alexandria Harvey MRN: [id number] Date of Birth: [birth year]  [facility] CM/SW Contact  Sherline Clack, [place] Phone Number: 08/23/2024, 10:29 AM  Clinical Narrative:     CSW talked to legal guardian Nat Duos regarding placement. Both Nat and patient's mother identified [facility] as facility of choice for patient to discharge to. CSW reached out to [facility] to ask about bed availability. Facility is full at this time but hoping to have a bed opening soon. Robbie with [facility] informed CSW patient is in her copay days (33 remaining) and will be responsible for up to $214/day. CSW reached out to [facility] to ask about bed availability and copay days before presenting options to guardian and mother.   Expected Discharge Plan: Skilled Nursing Facility Barriers to Discharge: Insurance Authorization, SNF Pending bed offer               Expected Discharge Plan and Services       Living arrangements for the past 2 months: Skilled Nursing Facility, Group Home                                       Social Drivers of Health (SDOH) Interventions SDOH Screenings   Food Insecurity: No Food Insecurity (06/11/2024)  Housing: Low Risk (06/11/2024)  Transportation Needs: No Transportation Needs (06/11/2024)  Utilities: Not At Risk (06/11/2024)  Financial Resource Strain: High Risk (02/29/2024)   Received from [facility]  Physical Activity: Unknown (02/29/2024)   Received [facility]  Social Connections: Moderately Integrated (02/29/2024)   Received from [facility]  Stress: No Stress Concern Present (02/29/2024)   Received from [facility]  Tobacco Use: Low Risk (08/15/2024)    Readmission Risk Interventions    06/13/2024    9:35 AM  Readmission Risk Prevention Plan  Transportation Screening Complete  PCP or Specialist Appt within 5-7 Days Complete   Home Care Screening Complete  Medication Review (RN CM) Complete

## 2024-08-23 NOTE — Progress Notes (Signed)
 Physical Therapy Treatment Patient Details Name: Alexandria Harvey MRN: [id number] DOB: [birth year] Today's Date: 08/23/2024   History of Present Illness 43 y.o. female admitted 08/11/24 for petechial rash suspect adverse drug reaction to Zosyn . 12/6 (+) DVT in BLE. PMHx: intellectual disability, schizophrenia, dropped head syndrome, HTN, seizures, diabetes, hx of C3, C5, C7 fxs with central cord syndrome, hx of T4-T5 fxs, and L foot/ankle fx.    PT Comments  Pt received in supine and agreeable to session with encouragement. Pt reports LLE pain and demonstrates very little touch or ROM tolerance. Pt able to slightly flex B knees (R>L), but RLE rests in hip external rotation. Pt declines further exercises or mobility due to pain despite encouragement and education. Education provided on importance of participation and increased mobility.  Pt continues to benefit from PT services to progress toward functional mobility goals.    If plan is discharge home, recommend the following: Two people to help with walking and/or transfers;Two people to help with bathing/dressing/bathroom;Assistance with cooking/housework;Assist for transportation;Help with stairs or ramp for entrance;Supervision due to cognitive status;Direct supervision/assist for medications management   Can travel by private vehicle     No  Equipment Recommendations  Hospital bed;Hoyer lift;Other (comment) (air mattress; tilt-n-space w/c)    Recommendations for Other Services       Precautions / Restrictions Precautions Precautions: Cervical;Fall Recall of Precautions/Restrictions: Impaired Restrictions Weight Bearing Restrictions Per Provider Order: No Other Position/Activity Restrictions: No brace needed per Neurosurgery note on 12/12     Mobility  Bed Mobility               General bed mobility comments: Pt declined rolling and EOB due to pain    Transfers                        Ambulation/Gait                    Stairs             Wheelchair Mobility     Tilt Bed    Modified Rankin (Stroke Patients Only)       Balance       Sitting balance - Comments: nt                                    Communication Communication Communication: Impaired Factors Affecting Communication: Reduced clarity of speech  Cognition Arousal: Alert Behavior During Therapy: Anxious, Lability   PT - Cognitive impairments: History of cognitive impairments, Problem solving, Attention                         Following commands: Impaired Following commands impaired: Only follows one step commands consistently, Follows one step commands with increased time, Follows multi-step commands inconsistently    Cueing Cueing Techniques: Verbal cues, Gestural cues, Tactile cues, Visual cues  Exercises General Exercises - Lower Extremity Heel Slides: AAROM, Supine, Both, 5 reps, AROM (RLE x3 AROM with hip remaining in external rotation)    General Comments        Pertinent Vitals/Pain Pain Assessment Pain Assessment: Faces Faces Pain Scale: Hurts whole lot Pain Location: BLE L>R with touch and mobility Pain Descriptors / Indicators: Aching, Discomfort, Grimacing, Moaning, Crying Pain Intervention(s): Limited activity within patient's tolerance, Monitored during session, Repositioned     PT Goals (current goals can now  be found in the care plan section) Acute Rehab PT Goals PT Goal Formulation: Patient unable to participate in goal setting Time For Goal Achievement: 08/27/24 Progress towards PT goals: Progressing toward goals    Frequency    Min 1X/week       AM-PAC PT 6 Clicks Mobility   Outcome Measure  Help needed turning from your back to your side while in a flat bed without using bedrails?: Total Help needed moving from lying on your back to sitting on the side of a flat bed without using bedrails?: Total Help needed moving to and from a  bed to a chair (including a wheelchair)?: Total Help needed standing up from a chair using your arms (e.g., wheelchair or bedside chair)?: Total Help needed to walk in hospital room?: Total Help needed climbing 3-5 steps with a railing? : Total 6 Click Score: 6    End of Session   Activity Tolerance: Patient limited by pain Patient left: in bed;with call bell/phone within reach;with family/visitor present Nurse Communication: Mobility status PT Visit Diagnosis: Pain;Other symptoms and signs involving the nervous system (R29.898);Muscle weakness (generalized) (M62.81);Other abnormalities of gait and mobility (R26.89)     Time: 1133-1150 PT Time Calculation (min) (ACUTE ONLY): 17 min  Charges:    $Therapeutic Exercise: 8-22 mins PT General Charges $$ ACUTE PT VISIT: 1 Visit                    Darryle George, PTA Acute Rehabilitation Services Secure Chat Preferred  [phone]    Darryle George 08/23/2024, 1:00 PM

## 2024-08-24 DIAGNOSIS — R233 Spontaneous ecchymoses: Secondary | ICD-10-CM | POA: Diagnosis not present

## 2024-08-24 DIAGNOSIS — A419 Sepsis, unspecified organism: Secondary | ICD-10-CM | POA: Diagnosis not present

## 2024-08-24 LAB — CBC
HCT: 38.1 % (ref 36.0–46.0)
Hemoglobin: 12 g/dL (ref 12.0–15.0)
MCH: 25.5 pg — ABNORMAL LOW (ref 26.0–34.0)
MCHC: 31.5 g/dL (ref 30.0–36.0)
MCV: 81.1 fL (ref 80.0–100.0)
Platelets: 347 K/uL (ref 150–400)
RBC: 4.7 MIL/uL (ref 3.87–5.11)
RDW: 19.2 % — ABNORMAL HIGH (ref 11.5–15.5)
WBC: 10.4 K/uL (ref 4.0–10.5)
nRBC: 0 % (ref 0.0–0.2)

## 2024-08-24 LAB — COMPREHENSIVE METABOLIC PANEL WITH GFR
ALT: 10 U/L (ref 0–44)
AST: 20 U/L (ref 15–41)
Albumin: 3.4 g/dL — ABNORMAL LOW (ref 3.5–5.0)
Alkaline Phosphatase: 92 U/L (ref 38–126)
Anion gap: 17 — ABNORMAL HIGH (ref 5–15)
BUN: 6 mg/dL (ref 6–20)
CO2: 19 mmol/L — ABNORMAL LOW (ref 22–32)
Calcium: 10 mg/dL (ref 8.9–10.3)
Chloride: 95 mmol/L — ABNORMAL LOW (ref 98–111)
Creatinine, Ser: 0.35 mg/dL — ABNORMAL LOW (ref 0.44–1.00)
GFR, Estimated: >60 mL/min (ref >60)
Glucose, Bld: 190 mg/dL — ABNORMAL HIGH (ref 70–99)
Potassium: 5.4 mmol/L — ABNORMAL HIGH (ref 3.5–5.1)
Sodium: 130 mmol/L — ABNORMAL LOW (ref 135–145)
Total Bilirubin: 0.3 mg/dL (ref 0.0–1.2)
Total Protein: 8.4 g/dL — ABNORMAL HIGH (ref 6.5–8.1)

## 2024-08-24 LAB — GLUCOSE, CAPILLARY
Glucose-Capillary: 147 mg/dL — ABNORMAL HIGH (ref 70–99)
Glucose-Capillary: 283 mg/dL — ABNORMAL HIGH (ref 70–99)
Glucose-Capillary: 203 mg/dL — ABNORMAL HIGH (ref 70–99)
Glucose-Capillary: 148 mg/dL — ABNORMAL HIGH (ref 70–99)

## 2024-08-24 MED ORDER — METFORMIN HCL ER 500 MG PO TB24
500.0000 mg | ORAL_TABLET | Freq: Every day | ORAL | Status: DC
  Administered 2024-08-24 – 2024-08-30 (×7): 500 mg via ORAL
  Filled 2024-08-24 (×7): qty 1

## 2024-08-24 MED ORDER — LUBIPROSTONE 24 MCG PO CAPS
24.0000 ug | ORAL_CAPSULE | Freq: Every day | ORAL | Status: DC
  Administered 2024-08-25 – 2024-09-01 (×8): 24 ug via ORAL
  Filled 2024-08-24 (×9): qty 1

## 2024-08-24 MED ORDER — INSULIN ASPART 100 UNIT/ML IJ SOLN
0.0000 [IU] | Freq: Three times a day (TID) | INTRAMUSCULAR | Status: DC
  Administered 2024-08-24 (×2): 5 [IU] via SUBCUTANEOUS
  Administered 2024-08-25 (×2): 2 [IU] via SUBCUTANEOUS
  Administered 2024-08-26: 3 [IU] via SUBCUTANEOUS
  Administered 2024-08-26: 2 [IU] via SUBCUTANEOUS
  Administered 2024-08-27: 3 [IU] via SUBCUTANEOUS
  Administered 2024-08-27 (×2): 2 [IU] via SUBCUTANEOUS
  Administered 2024-08-28: 5 [IU] via SUBCUTANEOUS
  Administered 2024-08-28: 2 [IU] via SUBCUTANEOUS
  Administered 2024-08-28: 3 [IU] via SUBCUTANEOUS
  Administered 2024-08-29: 5 [IU] via SUBCUTANEOUS
  Administered 2024-08-29: 3 [IU] via SUBCUTANEOUS
  Administered 2024-08-29 – 2024-08-31 (×3): 2 [IU] via SUBCUTANEOUS
  Administered 2024-09-01: 3 [IU] via SUBCUTANEOUS
  Filled 2024-08-24: qty 2
  Filled 2024-08-24: qty 5
  Filled 2024-08-24 (×2): qty 2
  Filled 2024-08-24: qty 5
  Filled 2024-08-24: qty 2
  Filled 2024-08-24: qty 3
  Filled 2024-08-24: qty 2
  Filled 2024-08-24: qty 3
  Filled 2024-08-24 (×2): qty 2
  Filled 2024-08-24 (×2): qty 5
  Filled 2024-08-24: qty 3
  Filled 2024-08-24: qty 2
  Filled 2024-08-24: qty 7
  Filled 2024-08-24 (×2): qty 2
  Filled 2024-08-24: qty 5
  Filled 2024-08-24: qty 2

## 2024-08-24 MED ORDER — INSULIN ASPART 100 UNIT/ML IJ SOLN
0.0000 [IU] | Freq: Every day | INTRAMUSCULAR | Status: DC
  Administered 2024-08-29 – 2024-08-30 (×2): 2 [IU] via SUBCUTANEOUS
  Filled 2024-08-24 (×3): qty 2

## 2024-08-24 MED ORDER — INSULIN GLARGINE 100 UNIT/ML ~~LOC~~ SOLN
10.0000 [IU] | Freq: Every day | SUBCUTANEOUS | Status: DC
  Administered 2024-08-24 – 2024-09-01 (×9): 10 [IU] via SUBCUTANEOUS
  Filled 2024-08-24 (×9): qty 0.1

## 2024-08-24 MED ORDER — SODIUM ZIRCONIUM CYCLOSILICATE 10 G PO PACK
10.0000 g | PACK | Freq: Two times a day (BID) | ORAL | Status: AC
  Administered 2024-08-24 (×2): 10 g via ORAL
  Filled 2024-08-24 (×2): qty 1

## 2024-08-24 MED ORDER — INSULIN GLARGINE-YFGN 100 UNIT/ML ~~LOC~~ SOPN: 10.0000 [IU] | PEN_INJECTOR | SUBCUTANEOUS | Status: DC

## 2024-08-24 NOTE — Plan of Care (Signed)

## 2024-08-24 NOTE — Progress Notes (Signed)
 Progress Note   Patient: Alexandria Harvey FMW:[id number] DOB: 10-26-1980 DOA: 08/11/2024     12 DOS: the patient was seen and examined on 08/24/2024   Brief hospital course: 43 year old female- Under guardianship of care for DSS-phone #[phone]--- apparently also has a caregiver [name]/[name] who is the group home, DM, mental retardation, Schizoaffective disorder, previous hallucination, sacral decubitus ulcer, cervical fracture with collar - p/w fever and encephalopathy.   Assessment and Plan: Sepsis, unclear etiology Presented with leukocytosis, lactic acidosis, concern for sepsis.  She has been afebrile here so far off antibiotics since 12/6. Blood cultures done off antibiotics on 12/8 and 12/9 NGTD  Got empiric antibiotics initiated at presentation.   -Evaluated by ID, Antibiotics stopped.  Currently on line holiday.   Autoimmune workup unremarkable with negative ANA, c-ANCA, p-ANCA, RF, Sjogren syndrome antibody A and B negative, negative lupus anticoagulant  -febrile on 12/14 to 100.62F. Ordered and reviewed CXR, unremarkable. Repeat blood cx pending, neg thus far. Pt has since remained afebrile thus far    Acute metabolic encephalopathy, resolved Possible polypharmacy, dehydration, ? sepsis, in the setting of intellectual delay. Medications weaned back.  Mental status noted to return back to baseline. Delirium precautions.   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   Evaluated by infectious disease.  Only minimal concern for infectious etiology, other differentials include Libman-Sacks, myxoma, clot.   Autoimmune workup so far negative (negative ANA, RA, ANCA).  Negative lupus anticoagulant panel.   Follow-up blood cultures closely.  Cardiology team favoring repeat TTE echo in 3 months after she has been on anticoagulation to evaluate need for TEE Continue eliquis  therapy.   Acute rash Unclear etiology, thought to be possible drug reaction versus excoriations from  bedbugs.  Antibiotics stopped.  Contact precautions.  Continue monitor closely.     Unstageable sacral ulcer (POA) Wound 06/11/24 Pressure Injury Sacrum Mid Unstageable - Full thickness tissue loss in which the base of the injury is covered by slough (yellow, tan, gray, green or brown) and/or eschar (tan, brown or black) in the wound bed. (Active)     Wound 08/16/24 0259 Pressure Injury Head Posterior Unstageable - Full thickness tissue loss in which the base of the injury is covered by slough (yellow, tan, gray, green or brown) and/or eschar (tan, brown or black) in the wound bed. (Active)    Was previously on vancomycin  prior to visit to ED.  Wound evaluated by wound care.  Wound care per their recommendations.   Diabetes mellitus type 2 Have been poorly controlled thus far with glucose in the mid-200's -Have started semglee  10 u -Increase SSI coverage to moderate coverage -Appreciate input by diabetic coordinator. May consider resuming metformin  while inpt   History of cervical fractures with polytrauma Pt had been in c-collar.  MRI C and T spine were noted to be reassuring. Ordered to remove C-spine collar as per neurosurgeon, Dr Valeda recommendations mentioned in his note on 12/12.   Acute DVT BL LE Lower extremity Dopplers noting BL proximal femoral vein DVTs.  Continue Eliquis .  Unclear if provoked or unprovoked.  Hypercoagulable workup, so far negative.   Severe constipation Noted incidentally on CT scan.  Continue bowel regimen. Encourage out of bed to chair.   Cognitive deficits/schizophrenia Resides at a group home.  guardianship of care for DSS-phone #[phone]--- apparently also has a caregiver [name]/[name] who is the group home.   Patient evaluated by psychiatry.  Medication recommendations per psych.   Hyponatremia -improved  this AM -cont to encourage po intake      Subjective: Asking to be repositioned in bed  Physical Exam: Vitals:   08/24/24 0033  08/24/24 0505 08/24/24 0800 08/24/24 1143  BP: (!) 136/99 (!) 138/91 (!) 139/99 (!) 141/90  Pulse: (!) 106 (!) 102 (!) 102 (!) 109  Resp: 18  18 19   Temp: 97.6 F (36.4 C)     TempSrc:      SpO2: 100% 100% 100% 99%  Weight:      Height:       General exam: Awake, laying in bed, in nad Respiratory system: Normal respiratory effort, no wheezing Cardiovascular system: regular rate, s1, s2 Gastrointestinal system: Soft, nondistended, positive BS Central nervous system: CN2-12 grossly intact, strength intact Extremities: Perfused, no clubbing Skin: Normal skin turgor, no notable skin lesions seen Psychiatry: Mood normal // affect seems normal  Data Reviewed:  There are no new results to review at this time.  Family Communication: Pt in room, family currently not at bedside  Disposition: Status is: Inpatient Remains inpatient appropriate because: severity of illness  Planned Discharge Destination: Skilled nursing facility    Author: Garnette Pelt, MD 08/24/2024 2:35 PM  For on call review [email or web address].

## 2024-08-24 NOTE — Inpatient Diabetes Management (Signed)
 Inpatient Diabetes Program Recommendations  AACE/[name]: New Consensus Statement on Inpatient Glycemic Control (2015)  Target Ranges:  Prepandial:   less than 140 mg/dL      Peak postprandial:   less than 180 mg/dL (1-2 hours)      Critically ill patients:  140 - 180 mg/dL   Lab Results  Component Value Date   GLUCAP 283 (H) 08/24/2024   HGBA1C 7.0 (H) 05/23/2024    Review of Glycemic Control  Latest Reference Range & Units 08/23/24 16:42 08/23/24 19:54 08/24/24 07:59 08/24/24 11:06  Glucose-Capillary 70 - 99 mg/dL 776 (H) 778 (H) 852 (H) 283 (H)   Diabetes history: DM 2  Outpatient Diabetes medications:  Prednisone 20 mg with breakfast Current orders for Inpatient glycemic control:  Novolog  0-15 units tid with meals and HS Lantus  10 units daily- just started Metformin  500 mg daily Inpatient Diabetes Program Recommendations:    Agree with current orders. May need metformin  started at home?  Thanks,  Randall Bullocks, RN, BC-ADM Inpatient Diabetes Coordinator Pager [phone]  (8a-5p)

## 2024-08-25 DIAGNOSIS — A419 Sepsis, unspecified organism: Secondary | ICD-10-CM | POA: Diagnosis not present

## 2024-08-25 DIAGNOSIS — R233 Spontaneous ecchymoses: Secondary | ICD-10-CM | POA: Diagnosis not present

## 2024-08-25 LAB — CULTURE, BLOOD (ROUTINE X 2)
Culture: NO GROWTH
Culture: NO GROWTH
Special Requests: ADEQUATE
Special Requests: ADEQUATE

## 2024-08-25 LAB — COMPREHENSIVE METABOLIC PANEL WITH GFR
ALT: 11 U/L (ref 0–44)
AST: 18 U/L (ref 15–41)
Albumin: 3.2 g/dL — ABNORMAL LOW (ref 3.5–5.0)
Alkaline Phosphatase: 72 U/L (ref 38–126)
Anion gap: 12 (ref 5–15)
BUN: 6 mg/dL (ref 6–20)
CO2: 23 mmol/L (ref 22–32)
Calcium: 9.6 mg/dL (ref 8.9–10.3)
Chloride: 100 mmol/L (ref 98–111)
Creatinine, Ser: <0.3 mg/dL — ABNORMAL LOW (ref 0.44–1.00)
Glucose, Bld: 110 mg/dL — ABNORMAL HIGH (ref 70–99)
Potassium: 4.2 mmol/L (ref 3.5–5.1)
Sodium: 135 mmol/L (ref 135–145)
Total Bilirubin: <0.2 mg/dL (ref 0.0–1.2)
Total Protein: 6.9 g/dL (ref 6.5–8.1)

## 2024-08-25 LAB — GLUCOSE, CAPILLARY
Glucose-Capillary: 107 mg/dL — ABNORMAL HIGH (ref 70–99)
Glucose-Capillary: 125 mg/dL — ABNORMAL HIGH (ref 70–99)
Glucose-Capillary: 136 mg/dL — ABNORMAL HIGH (ref 70–99)
Glucose-Capillary: 140 mg/dL — ABNORMAL HIGH (ref 70–99)

## 2024-08-25 LAB — CBC
HCT: 30.1 % — ABNORMAL LOW (ref 36.0–46.0)
Hemoglobin: 9.6 g/dL — ABNORMAL LOW (ref 12.0–15.0)
MCH: 25.6 pg — ABNORMAL LOW (ref 26.0–34.0)
MCHC: 31.9 g/dL (ref 30.0–36.0)
MCV: 80.3 fL (ref 80.0–100.0)
Platelets: 370 K/uL (ref 150–400)
RBC: 3.75 MIL/uL — ABNORMAL LOW (ref 3.87–5.11)
RDW: 19.1 % — ABNORMAL HIGH (ref 11.5–15.5)
WBC: 11.5 K/uL — ABNORMAL HIGH (ref 4.0–10.5)
nRBC: 0 % (ref 0.0–0.2)

## 2024-08-25 NOTE — TOC Progression Note (Signed)
 Transition of Care [facility]) - Progression Note    Patient Details  Name: Alexandria Harvey MRN: [id number] Date of Birth: [birth year]  [facility]) CM/SW Contact  Sherline Clack, [place] Phone Number: 08/25/2024, 3:47 PM  Clinical Narrative:     CSW left VM and call back number with legal guardian Nat Duos regarding placement at [facility] and to inform her patient is in her copay days. CSW will reach back out to [place] to update DC plan. CSW will continue to follow.   Expected Discharge Plan: Skilled Nursing Facility Barriers to Discharge: Insurance Authorization, SNF Pending bed offer               Expected Discharge Plan and Services       Living arrangements for the past 2 months: Skilled Nursing Facility, Group Home                                       Social Drivers of Health (SDOH) Interventions SDOH Screenings   Food Insecurity: No Food Insecurity (06/11/2024)  Housing: Low Risk (06/11/2024)  Transportation Needs: No Transportation Needs (06/11/2024)  Utilities: Not At Risk (06/11/2024)  Financial Resource Strain: High Risk (02/29/2024)   Received from [facility]  Physical Activity: Unknown (02/29/2024)   Received from [facility]  Social Connections: Moderately Integrated (02/29/2024)   Received from [facility]  Stress: No Stress Concern Present (02/29/2024)   Received from [facility]  Tobacco Use: Low Risk (08/15/2024)    Readmission Risk Interventions    06/13/2024    9:35 AM  Readmission Risk Prevention Plan  Transportation Screening Complete  PCP or Specialist Appt within 5-7 Days Complete  Home Care Screening Complete  Medication Review (RN CM) Complete

## 2024-08-25 NOTE — Progress Notes (Signed)
 " Progress Note   Patient: Alexandria Harvey FMW:[id number] DOB: 1981-04-01 DOA: 08/11/2024     13 DOS: the patient was seen and examined on 08/25/2024   Brief hospital course: 43 year old female- Under guardianship of care for DSS-phone #[phone]--- apparently also has a caregiver [name]/[name] who is the group home, DM, mental retardation, Schizoaffective disorder, previous hallucination, sacral decubitus ulcer, cervical fracture with collar - p/w fever and encephalopathy.   Assessment and Plan: Sepsis, unclear etiology Presented with leukocytosis, lactic acidosis, concern for sepsis.  She has been afebrile here so far off antibiotics since 12/6. Blood cultures done off antibiotics on 12/8 and 12/9 NGTD  Got empiric antibiotics initiated at presentation.   -Evaluated by ID, Antibiotics stopped.  Currently on line holiday.   Autoimmune workup unremarkable with negative ANA, c-ANCA, p-ANCA, RF, Sjogren syndrome antibody A and B negative, negative lupus anticoagulant  -transient temp of 100.21F on 12/14. Ordered and reviewed CXR, unremarkable. Repeat blood neg. Pt continues to remain afebrile thus far. Stable    Acute metabolic encephalopathy, resolved Possible polypharmacy, dehydration, ? sepsis, in the setting of intellectual delay. Medications weaned back.  Mental status noted to return back to baseline. Delirium precautions.   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   Evaluated by infectious disease.  Only minimal concern for infectious etiology, other differentials include Libman-Sacks, myxoma, clot.   Autoimmune workup so far negative (negative ANA, RA, ANCA).  Negative lupus anticoagulant panel.   Follow-up blood cultures closely.  Cardiology team favoring repeat TTE echo in 3 months after she has been on anticoagulation to evaluate need for TEE Continue eliquis  therapy.   Acute rash Unclear etiology, thought to be possible drug reaction versus excoriations from  bedbugs.  Antibiotics stopped.  Contact precautions.  Continue monitor closely.     Unstageable sacral ulcer (POA) Wound 06/11/24 Pressure Injury Sacrum Mid Unstageable - Full thickness tissue loss in which the base of the injury is covered by slough (yellow, tan, gray, green or brown) and/or eschar (tan, brown or black) in the wound bed. (Active)     Wound 08/16/24 0259 Pressure Injury Head Posterior Unstageable - Full thickness tissue loss in which the base of the injury is covered by slough (yellow, tan, gray, green or brown) and/or eschar (tan, brown or black) in the wound bed. (Active)    Was previously on vancomycin  prior to visit to ED.  Wound evaluated by wound care.  Wound care per their recommendations.   Diabetes mellitus type 2 Have been poorly controlled thus far with glucose in the mid-200's -Have started semglee  10 u -Increase SSI coverage to moderate coverage -Appreciate input by diabetic coordinator. May consider resuming metformin  while inpt   History of cervical fractures with polytrauma Pt had been in c-collar.  MRI C and T spine were noted to be reassuring. Ordered to remove C-spine collar as per neurosurgeon, Dr Valeda recommendations mentioned in his note on 12/12.   Acute DVT BL LE Lower extremity Dopplers noting BL proximal femoral vein DVTs.  Continue Eliquis .  Unclear if provoked or unprovoked.  Hypercoagulable workup, so far negative.   Severe constipation Noted incidentally on CT scan.  Continue bowel regimen. Encourage out of bed to chair.   Cognitive deficits/schizophrenia Resides at a group home.  guardianship of care for DSS-phone #[phone]--- apparently also has a caregiver [name]/[name] who is the group home.   Patient evaluated by psychiatry.  Medication recommendations per psych.   Hyponatremia -Normalized -cont  to encourage po intake      Subjective: No issues  Physical Exam: Vitals:   08/25/24 0110 08/25/24 0529 08/25/24 0754  08/25/24 1138  BP: (!) 109/46 (!) 138/90 106/65 (!) 128/95  Pulse: 97 93 94 98  Resp: 18  18 18   Temp: 98.7 F (37.1 C) 99 F (37.2 C) (!) 97.3 F (36.3 C) 97.8 F (36.6 C)  TempSrc:   Oral Oral  SpO2: 100% 96% 100% 99%  Weight:      Height:       General exam: Laying in bed, in no acute distress Respiratory system: normal chest rise, clear, no audible wheezing Cardiovascular system: perfused, no notable jvd Gastrointestinal system: Nondistended Central nervous system: No seizures, no tremors Extremities: No cyanosis, no joint deformities Skin: No rashes, no pallor  Data Reviewed:  Labs reviewed: Na 135, K 4.2, Cr <0.30, WBC 11.5, Hgb 9.6, Plts 370  Family Communication: Pt in room, family currently not at bedside  Disposition: Status is: Inpatient Remains inpatient appropriate because: severity of illness  Planned Discharge Destination: Skilled nursing facility    Author: Garnette Pelt, MD 08/25/2024 2:38 PM  For on call review [email or web address].  "

## 2024-08-25 NOTE — Plan of Care (Signed)
" °  Problem: Safety: Goal: Ability to remain free from injury will improve Outcome: Progressing   Problem: Pain Managment: Goal: General experience of comfort will improve and/or be controlled Outcome: Progressing   Problem: Elimination: Goal: Will not experience complications related to bowel motility Outcome: Progressing Goal: Will not experience complications related to urinary retention Outcome: Progressing   "

## 2024-08-26 DIAGNOSIS — A419 Sepsis, unspecified organism: Secondary | ICD-10-CM | POA: Diagnosis not present

## 2024-08-26 DIAGNOSIS — R233 Spontaneous ecchymoses: Secondary | ICD-10-CM | POA: Diagnosis not present

## 2024-08-26 LAB — GLUCOSE, CAPILLARY
Glucose-Capillary: 101 mg/dL — ABNORMAL HIGH (ref 70–99)
Glucose-Capillary: 186 mg/dL — ABNORMAL HIGH (ref 70–99)
Glucose-Capillary: 146 mg/dL — ABNORMAL HIGH (ref 70–99)
Glucose-Capillary: 128 mg/dL — ABNORMAL HIGH (ref 70–99)

## 2024-08-26 NOTE — Plan of Care (Signed)
   Problem: Activity: Goal: Risk for activity intolerance will decrease Outcome: Progressing   Problem: Nutrition: Goal: Adequate nutrition will be maintained Outcome: Progressing   Problem: Safety: Goal: Ability to remain free from injury will improve Outcome: Progressing   Problem: Skin Integrity: Goal: Risk for impaired skin integrity will decrease Outcome: Progressing

## 2024-08-26 NOTE — Progress Notes (Signed)
 " Progress Note   Patient: Alexandria Harvey FMW:[id number] DOB: 1981-04-28 DOA: 08/11/2024     14 DOS: the patient was seen and examined on 08/26/2024   Brief hospital course: 43 year old female- Under guardianship of care for DSS-phone #[phone]--- apparently also has a caregiver [name]/[name] who is the group home, DM, mental retardation, Schizoaffective disorder, previous hallucination, sacral decubitus ulcer, cervical fracture with collar - p/w fever and encephalopathy.   Assessment and Plan: Sepsis, unclear etiology Presented with leukocytosis, lactic acidosis, concern for sepsis.  She has been afebrile here so far off antibiotics since 12/6. Blood cultures done off antibiotics on 12/8 and 12/9 NGTD  Got empiric antibiotics initiated at presentation.   -Evaluated by ID, Antibiotics stopped.  Currently on line holiday.   Autoimmune workup unremarkable with negative ANA, c-ANCA, p-ANCA, RF, Sjogren syndrome antibody A and B negative, negative lupus anticoagulant  -transient temp of 100.28F on 12/14. CXR clear. Repeat blood neg. Pt remains afebrile. Remains stable    Acute metabolic encephalopathy, resolved Possible polypharmacy, dehydration, ? sepsis, in the setting of intellectual delay. Medications weaned back.  Mental status noted to return back to baseline. Delirium precautions.   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   Evaluated by infectious disease.  Only minimal concern for infectious etiology, other differentials include Libman-Sacks, myxoma, clot.   Autoimmune workup so far negative (negative ANA, RA, ANCA).  Negative lupus anticoagulant panel.   Follow-up blood cultures closely.  Cardiology team favoring repeat TTE echo in 3 months after she has been on anticoagulation to evaluate need for TEE Continue eliquis  therapy.   Acute rash Unclear etiology, thought to be possible drug reaction versus excoriations from bedbugs.  Antibiotics stopped.   -stable now    Unstageable sacral ulcer (POA) Wound 06/11/24 Pressure Injury Sacrum Mid Unstageable - Full thickness tissue loss in which the base of the injury is covered by slough (yellow, tan, gray, green or brown) and/or eschar (tan, brown or black) in the wound bed. (Active)     Wound 08/16/24 0259 Pressure Injury Head Posterior Unstageable - Full thickness tissue loss in which the base of the injury is covered by slough (yellow, tan, gray, green or brown) and/or eschar (tan, brown or black) in the wound bed. (Active)    Was previously on vancomycin  prior to visit to ED.  Wound evaluated by wound care.  Wound care per their recommendations.   Diabetes mellitus type 2 Have been poorly controlled thus far with glucose in the mid-200's -Have started semglee  10 u -Increase SSI coverage to moderate coverage -Appreciate input by diabetic coordinator. May consider resuming metformin  while inpt   History of cervical fractures with polytrauma Pt had been in c-collar.  MRI C and T spine were noted to be reassuring. Ordered to remove C-spine collar as per neurosurgeon, Dr Valeda recommendations mentioned in his note on 12/12.   Acute DVT BL LE Lower extremity Dopplers noting BL proximal femoral vein DVTs.  Continue Eliquis .  Unclear if provoked or unprovoked.  Hypercoagulable workup, so far negative.   Severe constipation Noted incidentally on CT scan.  Continue bowel regimen. Encourage out of bed to chair.   Cognitive deficits/schizophrenia Resides at a group home.  guardianship of care for DSS-phone #[phone]--- apparently also has a caregiver [name]/[name] who is the group home.   Patient evaluated by psychiatry.  Medication recommendations per psych.   Hyponatremia -Normalized -cont to encourage po intake      Subjective: Without  issues  Physical Exam: Vitals:   08/25/24 1959 08/25/24 2322 08/26/24 0507 08/26/24 0800  BP: (!) 141/86 (!) 129/91 (!) 143/103 (!) 103/54  Pulse:  (!) 109  100 (!) 106  Resp:    20  Temp: 97.8 F (36.6 C) 98 F (36.7 C) 98.6 F (37 C) (!) 97.4 F (36.3 C)  TempSrc: Oral   Oral  SpO2: 100% 98% 100% 98%  Weight:      Height:       General exam: Laying in bed, in no acute distress Respiratory system: normal chest rise, clear, no audible wheezing Cardiovascular system: regular rhythm, s1-s2 Gastrointestinal system: Nondistended, nontender, pos BS Central nervous system: No seizures, no tremors Extremities: No cyanosis, no joint deformities Skin: No rashes, no pallor  Data Reviewed:  There are no new results to review at this time.  Family Communication: Pt in room, family currently not at bedside  Disposition: Status is: Inpatient Remains inpatient appropriate because: severity of illness  Planned Discharge Destination: Skilled nursing facility    Author: Garnette Pelt, MD 08/26/2024 2:01 PM  For on call review [email or web address].  "

## 2024-08-26 NOTE — Progress Notes (Signed)
 Occupational Therapy Treatment Patient Details Name: Alexandria Harvey MRN: [id number] DOB: 11-24-1980 Today's Date: 08/26/2024   History of present illness 43 y.o. female admitted 08/11/24 for petechial rash suspect adverse drug reaction to Zosyn . 12/6 (+) DVT in BLE. PMHx: intellectual disability, schizophrenia, dropped head syndrome, HTN, seizures, diabetes, hx of C3, C5, C7 fxs with central cord syndrome, hx of T4-T5 fxs, and L foot/ankle fx.   OT comments  Patient received in supine and agreeable to work with OT on self feeding. Patient demonstrating gains with holding onto utensil using red foam to build up grip and able to bring spoon to/from mouth with occasional assist to steady hand or guide utensil. Patient requiring max assist to load utensil. Nursing informed on patient's progress and encourage to allow patient opportunities to feed self. Patient will benefit from continued inpatient follow up therapy, <3 hours/day. Acute OT to continue to follow to address established goals to facilitate DC to next venue of care.        If plan is discharge home, recommend the following:  Two people to help with walking and/or transfers;Two people to help with bathing/dressing/bathroom;Assistance with cooking/housework;Assistance with feeding;Direct supervision/assist for medications management;Direct supervision/assist for financial management;Assist for transportation;Help with stairs or ramp for entrance;Supervision due to cognitive status   Equipment Recommendations  Other (comment) (defer to next level of care)    Recommendations for Other Services      Precautions / Restrictions Precautions Precautions: Cervical;Fall Precaution Booklet Issued: No Recall of Precautions/Restrictions: Impaired Restrictions Weight Bearing Restrictions Per Provider Order: No       Mobility Bed Mobility Overal bed mobility: Needs Assistance             General bed mobility comments: max assist to  position in bed for self feeding    Transfers                         Balance                                           ADL either performed or assessed with clinical judgement   ADL Overall ADL's : Needs assistance/impaired Eating/Feeding: Maximal assistance;With adaptive utensils;Bed level Eating/Feeding Details (indicate cue type and reason): patient required assistance to load utensils and was able to bring to mouth with occasional assistance to steady hand or guide to mouth Grooming: Wash/dry face;Maximal assistance Grooming Details (indicate cue type and reason): hand over hand                               General ADL Comments: increased ability to hold spoon with red foam built up handle    Extremity/Trunk Assessment              Vision       Perception     Praxis     Communication Communication Communication: Impaired Factors Affecting Communication: Reduced clarity of speech   Cognition Arousal: Alert Behavior During Therapy: Anxious, Lability Cognition: History of cognitive impairments                               Following commands: Impaired Following commands impaired: Only follows one step commands consistently, Follows one step commands with increased time, Follows multi-step  commands inconsistently      Cueing   Cueing Techniques: Verbal cues, Gestural cues, Tactile cues, Visual cues  Exercises      Shoulder Instructions       General Comments frequent repostioning needed during self feeding    Pertinent Vitals/ Pain       Pain Assessment Pain Assessment: Faces Faces Pain Scale: Hurts even more Pain Location: BLE L>R with touch and mobility Pain Descriptors / Indicators: Aching, Discomfort, Grimacing, Moaning Pain Intervention(s): Limited activity within patient's tolerance, Monitored during session, Repositioned  Home Living                                           Prior Functioning/Environment              Frequency  Min 1X/week        Progress Toward Goals  OT Goals(current goals can now be found in the care plan section)  Progress towards OT goals: Progressing toward goals     Plan      Co-evaluation                 AM-PAC OT 6 Clicks Daily Activity     Outcome Measure   Help from another person eating meals?: A Lot Help from another person taking care of personal grooming?: Total Help from another person toileting, which includes using toliet, bedpan, or urinal?: Total Help from another person bathing (including washing, rinsing, drying)?: Total Help from another person to put on and taking off regular upper body clothing?: Total Help from another person to put on and taking off regular lower body clothing?: Total 6 Click Score: 7    End of Session Equipment Utilized During Treatment: Other (comment) (red foam to build up tutensil)  OT Visit Diagnosis: Other abnormalities of gait and mobility (R26.89);Muscle weakness (generalized) (M62.81);Ataxia, unspecified (R27.0)   Activity Tolerance Patient tolerated treatment well   Patient Left in bed;with call bell/phone within reach   Nurse Communication Mobility status        Time: 8683-8653 OT Time Calculation (min): 30 min  Charges: OT General Charges $OT Visit: 1 Visit OT Treatments $Self Care/Home Management : 23-37 mins  Dick Laine, OTA Acute Rehabilitation Services  Office [phone]   Jeb LITTIE Laine 08/26/2024, 2:25 PM

## 2024-08-26 NOTE — Consult Note (Signed)
 WOC Nurse Consult Note: see prior WOC consults for sacral and posterior head wounds  Reason for Consult: R labial wound  Wound type: full thickness ? Etiology likely trauma (skin tear)  Pressure Injury POA: NA  Measurement:see nursing flowsheet  Wound bed: 80% tan 20% red  Drainage (amount, consistency, odor) see nursing flowsheet  Periwound: intact  Dressing procedure/placement/frequency: Cleanse R labial wound with Vashe, do not rinse. Apply Xeroform gauze ([place] [phone]) to wound bed daily and attempt to secure with ABD pad and mesh underwear.    POC discussed with bedside nurse. WOC team will not follow. Reconsult if further needs arise.   Thank you,    Powell Bar MSN, RN-BC, [employer]

## 2024-08-26 NOTE — Plan of Care (Signed)

## 2024-08-27 DIAGNOSIS — R233 Spontaneous ecchymoses: Secondary | ICD-10-CM | POA: Diagnosis not present

## 2024-08-27 LAB — COMPREHENSIVE METABOLIC PANEL WITH GFR
ALT: 7 U/L (ref 0–44)
AST: 18 U/L (ref 15–41)
Albumin: 3.3 g/dL — ABNORMAL LOW (ref 3.5–5.0)
Alkaline Phosphatase: 88 U/L (ref 38–126)
Anion gap: 15 (ref 5–15)
BUN: 6 mg/dL (ref 6–20)
CO2: 20 mmol/L — ABNORMAL LOW (ref 22–32)
Calcium: 9.7 mg/dL (ref 8.9–10.3)
Chloride: 99 mmol/L (ref 98–111)
Creatinine, Ser: 0.37 mg/dL — ABNORMAL LOW (ref 0.44–1.00)
GFR, Estimated: >60 mL/min
Glucose, Bld: 175 mg/dL — ABNORMAL HIGH (ref 70–99)
Potassium: 4.2 mmol/L (ref 3.5–5.1)
Sodium: 134 mmol/L — ABNORMAL LOW (ref 135–145)
Total Bilirubin: <0.2 mg/dL (ref 0.0–1.2)
Total Protein: 7.8 g/dL (ref 6.5–8.1)

## 2024-08-27 LAB — CBC
HCT: 35.5 % — ABNORMAL LOW (ref 36.0–46.0)
Hemoglobin: 11.1 g/dL — ABNORMAL LOW (ref 12.0–15.0)
MCH: 24.9 pg — ABNORMAL LOW (ref 26.0–34.0)
MCHC: 31.3 g/dL (ref 30.0–36.0)
MCV: 79.8 fL — ABNORMAL LOW (ref 80.0–100.0)
Platelets: 399 K/uL (ref 150–400)
RBC: 4.45 MIL/uL (ref 3.87–5.11)
RDW: 19.5 % — ABNORMAL HIGH (ref 11.5–15.5)
WBC: 12.4 K/uL — ABNORMAL HIGH (ref 4.0–10.5)
nRBC: 0 % (ref 0.0–0.2)

## 2024-08-27 LAB — CBC WITH DIFFERENTIAL/PLATELET
Abs Immature Granulocytes: 0.11 K/uL — ABNORMAL HIGH (ref 0.00–0.07)
Basophils Absolute: 0.1 K/uL (ref 0.0–0.1)
Basophils Relative: 1 %
Eosinophils Absolute: 0.1 K/uL (ref 0.0–0.5)
Eosinophils Relative: 1 %
HCT: 33.4 % — ABNORMAL LOW (ref 36.0–46.0)
Hemoglobin: 10.3 g/dL — ABNORMAL LOW (ref 12.0–15.0)
Immature Granulocytes: 1 %
Lymphocytes Relative: 30 %
Lymphs Abs: 3.9 K/uL (ref 0.7–4.0)
MCH: 25.1 pg — ABNORMAL LOW (ref 26.0–34.0)
MCHC: 30.8 g/dL (ref 30.0–36.0)
MCV: 81.3 fL (ref 80.0–100.0)
Monocytes Absolute: 0.9 K/uL (ref 0.1–1.0)
Monocytes Relative: 7 %
Neutro Abs: 8.1 K/uL — ABNORMAL HIGH (ref 1.7–7.7)
Neutrophils Relative %: 60 %
Platelets: 343 K/uL (ref 150–400)
RBC: 4.11 MIL/uL (ref 3.87–5.11)
RDW: 19.4 % — ABNORMAL HIGH (ref 11.5–15.5)
WBC: 13.2 K/uL — ABNORMAL HIGH (ref 4.0–10.5)
nRBC: 0 % (ref 0.0–0.2)

## 2024-08-27 LAB — GLUCOSE, CAPILLARY
Glucose-Capillary: 121 mg/dL — ABNORMAL HIGH (ref 70–99)
Glucose-Capillary: 165 mg/dL — ABNORMAL HIGH (ref 70–99)
Glucose-Capillary: 131 mg/dL — ABNORMAL HIGH (ref 70–99)
Glucose-Capillary: 139 mg/dL — ABNORMAL HIGH (ref 70–99)

## 2024-08-27 NOTE — Progress Notes (Signed)
 " PROGRESS NOTE    Alexandria Harvey  FMW:[id number] DOB: [birth year] DOA: 08/11/2024 PCP: [facility]   Brief Narrative:  43 year old female- Under guardianship of care for DSS-phone #[phone]--- apparently also has a caregiver [name]/[name] who is the group home, DM, mental retardation, Schizoaffective disorder, previous hallucination, sacral decubitus ulcer, cervical fracture with collar - p/w fever and encephalopathy.   Assessment & Plan:   Principal Problem:   Petechial rash Active Problems:   Sepsis (HCC)   Aortic valve mass   Multiple fractures of thoracic spine, closed (HCC)   Closed fracture of multiple cervical vertebrae (HCC)   Drug rash   Cognitive developmental delay   Lactic acid acidosis   Schizoaffective disorder (HCC)  Sepsis, unclear etiology Presented with leukocytosis, lactic acidosis, concern for sepsis.  She has been afebrile here so far off antibiotics since 12/6. Blood cultures done off antibiotics on 12/8 and 12/9 NGTD  Got empiric antibiotics initiated at presentation.   -Evaluated by ID, Antibiotics stopped.  Currently on line holiday.   Autoimmune workup unremarkable with negative ANA, c-ANCA, p-ANCA, RF, Sjogren syndrome antibody A and B negative, negative lupus anticoagulant  -transient temp of 100.71F on 12/14. CXR clear. Repeat blood neg. Pt remains afebrile. Remains stable    Acute metabolic encephalopathy, resolved Possible polypharmacy, dehydration, ? sepsis, in the setting of intellectual delay. Medications weaned back.  Mental status noted to return back to baseline. Delirium precautions.   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   Evaluated by infectious disease.  Only minimal concern for infectious etiology, other differentials include Libman-Sacks, myxoma, clot.   Autoimmune workup so far negative (negative ANA, RA, ANCA).  Negative lupus anticoagulant panel.   Follow-up blood cultures  closely.  Cardiology team favoring repeat TTE echo in 3 months after she has been on anticoagulation to evaluate need for TEE Continue eliquis  therapy.   Acute rash Unclear etiology, thought to be possible drug reaction versus excoriations from bedbugs.  Antibiotics stopped.   -stable now   Unstageable sacral ulcer (POA) Wound 06/11/24 Pressure Injury Sacrum Mid Unstageable - Full thickness tissue loss in which the base of the injury is covered by slough (yellow, tan, gray, green or brown) and/or eschar (tan, brown or black) in the wound bed. (Active)     Wound 08/16/24 0259 Pressure Injury Head Posterior Unstageable - Full thickness tissue loss in which the base of the injury is covered by slough (yellow, tan, gray, green or brown) and/or eschar (tan, brown or black) in the wound bed. (Active)    Was previously on vancomycin  prior to visit to ED.  Wound evaluated by wound care.  Wound care per their recommendations.   Diabetes mellitus type 2 Currently on Lantus  10 units and SSI, blood sugar controlled.   History of cervical fractures with polytrauma Pt had been in c-collar.  MRI C and T spine were noted to be reassuring. C-spine collar removed as per neurosurgeon, Dr Valeda recommendations mentioned in his note on 12/12.   Acute DVT BL LE Lower extremity Dopplers noting BL proximal femoral vein DVTs.  Continue Eliquis .  Unclear if provoked or unprovoked.  Hypercoagulable workup, so far negative.   Severe constipation Noted incidentally on CT scan.  Continue bowel regimen. Encourage out of bed to chair.   Cognitive deficits/schizophrenia Resides at a group home.  guardianship of care for DSS-phone #[phone]--- apparently also has a caregiver [name]/[name] who is the group home.  Patient evaluated by psychiatry.  Medication recommendations per psych.   Hyponatremia -Normalized -cont to encourage po intake  DVT prophylaxis: SCDs Start: 08/12/24 9360   Code Status: Full Code   Family Communication: Brother present at bedside.  Plan of care discussed with patient in length and he/she verbalized understanding and agreed with it.  Status is: Inpatient Remains inpatient appropriate because: Medically stable, pending placement.   Estimated body mass index is 28.31 kg/m as calculated from the following:   Height as of this encounter: 5' 2 (1.575 m).   Weight as of this encounter: 70.2 kg.  Wound 06/11/24 Pressure Injury Sacrum Mid Unstageable - Full thickness tissue loss in which the base of the injury is covered by slough (yellow, tan, gray, green or brown) and/or eschar (tan, brown or black) in the wound bed. (Active)     Wound 08/16/24 0259 Pressure Injury Head Posterior Unstageable - Full thickness tissue loss in which the base of the injury is covered by slough (yellow, tan, gray, green or brown) and/or eschar (tan, brown or black) in the wound bed. (Active)   Nutritional Assessment: Body mass index is 28.31 kg/m.[name] Seen by dietician.  I agree with the assessment and plan as outlined below: Nutrition Status:        . Skin Assessment: I have examined the patient's skin and I agree with the wound assessment as performed by the wound care RN as outlined below: Wound 06/11/24 Pressure Injury Sacrum Mid Unstageable - Full thickness tissue loss in which the base of the injury is covered by slough (yellow, tan, gray, green or brown) and/or eschar (tan, brown or black) in the wound bed. (Active)     Wound 08/16/24 0259 Pressure Injury Head Posterior Unstageable - Full thickness tissue loss in which the base of the injury is covered by slough (yellow, tan, gray, green or brown) and/or eschar (tan, brown or black) in the wound bed. (Active)    Consultants:  Neurosurgery, cardiology, psychiatry, infectious disease  Procedures:  None  Antimicrobials:  Anti-infectives (From admission, onward)    Start     Dose/Rate Route Frequency Ordered Stop   08/12/24 1700   linezolid  (ZYVOX ) IVPB 600 mg  Status:  Discontinued        600 mg 300 mL/hr over 60 Minutes Intravenous Every 12 hours 08/12/24 0645 08/12/24 0823   08/12/24 1700  metroNIDAZOLE  (FLAGYL ) IVPB 500 mg  Status:  Discontinued        500 mg 100 mL/hr over 60 Minutes Intravenous Every 12 hours 08/12/24 0645 08/12/24 0823   08/12/24 1200  ceFEPIme  (MAXIPIME ) 2 g in sodium chloride  0.9 % 100 mL IVPB  Status:  Discontinued        2 g 200 mL/hr over 30 Minutes Intravenous Every 8 hours 08/12/24 0648 08/12/24 0823   08/12/24 0245  linezolid  (ZYVOX ) IVPB 600 mg        600 mg 300 mL/hr over 60 Minutes Intravenous  Once 08/12/24 0233 08/12/24 0453   08/12/24 0230  ceFEPIme  (MAXIPIME ) 2 g in sodium chloride  0.9 % 100 mL IVPB        2 g 200 mL/hr over 30 Minutes Intravenous  Once 08/12/24 0221 08/12/24 0343   08/12/24 0230  metroNIDAZOLE  (FLAGYL ) IVPB 500 mg        500 mg 100 mL/hr over 60 Minutes Intravenous  Once 08/12/24 0221 08/12/24 0603         Subjective: Patient seen and examined, she appears to be alert  and oriented, brother at the bedside.  Patient complains of pain in bilateral legs which is not new for her.  Objective: Vitals:   08/26/24 2307 08/27/24 0425 08/27/24 0833 08/27/24 1133  BP: 137/61 97/69 (!) 110/90 122/82  Pulse:  98 (!) 109 (!) 109  Resp: 17 20 18 20   Temp: 99.1 F (37.3 C) 97.9 F (36.6 C) 98.3 F (36.8 C) 98.2 F (36.8 C)  TempSrc:  Oral Oral Oral  SpO2: 100% 94% 98% 97%  Weight:      Height:        Intake/Output Summary (Last 24 hours) at 08/27/2024 1150 Last data filed at 08/27/2024 0900 Gross per 24 hour  Intake 118 ml  Output 1600 ml  Net -1482 ml   Filed Weights   08/12/24 0600  Weight: 70.2 kg    Examination:  General exam: Appears calm and comfortable  Respiratory system: Clear to auscultation. Respiratory effort normal. Cardiovascular system: S1 & S2 heard, RRR. No JVD, murmurs, rubs, gallops or clicks. No pedal  edema. Gastrointestinal system: Abdomen is nondistended, soft and nontender. No organomegaly or masses felt. Normal bowel sounds heard. Central nervous system: Alert and oriented. No focal neurological deficits.  Data Reviewed: I have personally reviewed following labs and imaging studies  CBC: Recent Labs  Lab 08/21/24 0218 08/24/24 0220 08/25/24 0531 08/27/24 0151  WBC 17.3* 10.4 11.5* 12.4*  HGB 10.8* 12.0 9.6* 11.1*  HCT 33.3* 38.1 30.1* 35.5*  MCV 78.5* 81.1 80.3 79.8*  PLT 391 347 370 399   Basic Metabolic Panel: Recent Labs  Lab 08/21/24 0218 08/22/24 0428 08/24/24 0220 08/25/24 0531 08/27/24 0151  NA 128* 131* 130* 135 134*  K 4.8 4.4 5.4* 4.2 4.2  CL 95* 96* 95* 100 99  CO2 21* 19* 19* 23 20*  GLUCOSE 188* 177* 190* 110* 175*  BUN 6 <5* 6 6 6   CREATININE 0.37* 0.36* 0.35* <0.30* 0.37*  CALCIUM  8.7* 9.4 10.0 9.6 9.7   GFR: Estimated Creatinine Clearance: 84 mL/min (A) (by C-G formula based on SCr of 0.37 mg/dL (L)). Liver Function Tests: Recent Labs  Lab 08/21/24 0218 08/22/24 0428 08/24/24 0220 08/25/24 0531 08/27/24 0151  AST 20 17 20 18 18   ALT 11 12 10 11 7   ALKPHOS 59 63 92 72 88  BILITOT 0.4 0.6 0.3 <0.2 <0.2  PROT 6.8 6.9 8.4* 6.9 7.8  ALBUMIN 2.3* 2.2* 3.4* 3.2* 3.3*   No results for input(s): LIPASE, AMYLASE in the last 168 hours. No results for input(s): AMMONIA in the last 168 hours. Coagulation Profile: No results for input(s): INR, PROTIME in the last 168 hours. Cardiac Enzymes: No results for input(s): CKTOTAL, CKMB, CKMBINDEX, TROPONINI in the last 168 hours. BNP (last 3 results) No results for input(s): PROBNP in the last 8760 hours. HbA1C: No results for input(s): HGBA1C in the last 72 hours. CBG: Recent Labs  Lab 08/26/24 1141 08/26/24 1609 08/26/24 2045 08/27/24 0833 08/27/24 1134  GLUCAP 186* 146* 128* 121* 165*   Lipid Profile: No results for input(s): CHOL, HDL, LDLCALC, TRIG,  CHOLHDL, LDLDIRECT in the last 72 hours. Thyroid  Function Tests: No results for input(s): TSH, T4TOTAL, FREET4, T3FREE, THYROIDAB in the last 72 hours. Anemia Panel: No results for input(s): VITAMINB12, FOLATE, FERRITIN, TIBC, IRON, RETICCTPCT in the last 72 hours. Sepsis Labs: No results for input(s): PROCALCITON, LATICACIDVEN in the last 168 hours.  Recent Results (from the past 240 hours)  Culture, blood (Routine X 2) w Reflex to ID Panel  Status: None   Collection Time: 08/20/24  9:08 AM   Specimen: BLOOD RIGHT HAND  Result Value Ref Range Status   Specimen Description BLOOD RIGHT HAND  Final   Special Requests   Final    BOTTLES DRAWN AEROBIC AND ANAEROBIC Blood Culture adequate volume   Culture   Final    NO GROWTH 5 DAYS Performed at [facility], [address] [place], [place] [address]    Report Status 08/25/2024 FINAL  Final  Culture, blood (Routine X 2) w Reflex to ID Panel     Status: None   Collection Time: 08/20/24  9:16 AM   Specimen: BLOOD LEFT HAND  Result Value Ref Range Status   Specimen Description BLOOD LEFT HAND  Final   Special Requests   Final    BOTTLES DRAWN AEROBIC AND ANAEROBIC Blood Culture adequate volume   Culture   Final    NO GROWTH 5 DAYS Performed at [facility], [address], [place], [place] [address]    Report Status 08/25/2024 FINAL  Final     Radiology Studies: No results found.  Scheduled Meds:  apixaban   5 mg Oral BID   cloZAPine   50 mg Oral BID   collagenase    Topical Daily   diphenhydrAMINE -zinc  acetate   Topical BID   divalproex   250 mg Oral TID   gabapentin   100 mg Oral TID   insulin  aspart  0-15 Units Subcutaneous TID WC   insulin  aspart  0-5 Units Subcutaneous QHS   insulin  glargine  10 Units Subcutaneous Daily   lubiprostone   24 mcg Oral Q breakfast   metFORMIN   500 mg Oral Q breakfast   polyethylene glycol  17 g Oral BID   senna-docusate  1 tablet Oral Daily    Continuous Infusions:   LOS: 15 days   Fredia Skeeter, MD [facility]  08/27/2024, 11:50 AM   *Please note that this is a verbal dictation therefore any spelling or grammatical errors are due to the Dragon Medical One system interpretation.  Please page via Amion and do not message via secure chat for urgent patient care matters. Secure chat can be used for non urgent patient care matters.  How to contact the [facility] Attending or Consulting provider 7A - 7P or covering provider during after hours 7P -7A, for this patient?  Check the care team in [facility] and look for a) attending/consulting [facility] provider listed and b) the [facility] team listed. Page or secure chat 7A-7P. Log into [email or web address] and use [place]'s universal password to access. If you do not have the password, please contact the hospital operator. Locate the [facility] provider you are looking for under [facility] and page to a number that you can be directly reached. If you still have difficulty reaching the provider, please page the [facility] (Director on Call) for the Hospitalists listed on amion for assistance.  "

## 2024-08-27 NOTE — Plan of Care (Signed)

## 2024-08-27 NOTE — Plan of Care (Signed)
" °  Problem: Education: Goal: Knowledge of General Education information will improve Description: Including pain rating scale, medication(s)/side effects and non-pharmacologic comfort measures Outcome: Progressing   Problem: Health Behavior/Discharge Planning: Goal: Ability to manage health-related needs will improve Outcome: Progressing   Problem: Clinical Measurements: Goal: Ability to maintain clinical measurements within normal limits will improve Outcome: Progressing Goal: Will remain free from infection Outcome: Progressing Goal: Diagnostic test results will improve Outcome: Progressing Goal: Cardiovascular complication will be avoided Outcome: Progressing   Problem: Activity: Goal: Risk for activity intolerance will decrease Outcome: Progressing   Problem: Nutrition: Goal: Adequate nutrition will be maintained Outcome: Progressing   Problem: Coping: Goal: Level of anxiety will decrease Outcome: Progressing   Problem: Skin Integrity: Goal: Risk for impaired skin integrity will decrease Outcome: Progressing   Problem: Coping: Goal: Ability to adjust to condition or change in health will improve Outcome: Progressing   Problem: Nutritional: Goal: Maintenance of adequate nutrition will improve Outcome: Progressing   Problem: Education: Goal: Knowledge of General Education information will improve Description: Including pain rating scale, medication(s)/side effects and non-pharmacologic comfort measures Outcome: Progressing   Problem: Health Behavior/Discharge Planning: Goal: Ability to manage health-related needs will improve Outcome: Progressing   Problem: Clinical Measurements: Goal: Ability to maintain clinical measurements within normal limits will improve Outcome: Progressing Goal: Will remain free from infection Outcome: Progressing Goal: Diagnostic test results will improve Outcome: Progressing Goal: Cardiovascular complication will be  avoided Outcome: Progressing   Problem: Activity: Goal: Risk for activity intolerance will decrease Outcome: Progressing   Problem: Nutrition: Goal: Adequate nutrition will be maintained Outcome: Progressing   Problem: Coping: Goal: Level of anxiety will decrease Outcome: Progressing   Problem: Skin Integrity: Goal: Risk for impaired skin integrity will decrease Outcome: Progressing   Problem: Coping: Goal: Ability to adjust to condition or change in health will improve Outcome: Progressing   Problem: Nutritional: Goal: Maintenance of adequate nutrition will improve Outcome: Progressing   "

## 2024-08-27 NOTE — Plan of Care (Signed)

## 2024-08-28 DIAGNOSIS — R233 Spontaneous ecchymoses: Secondary | ICD-10-CM | POA: Diagnosis not present

## 2024-08-28 LAB — GLUCOSE, CAPILLARY
Glucose-Capillary: 132 mg/dL — ABNORMAL HIGH (ref 70–99)
Glucose-Capillary: 229 mg/dL — ABNORMAL HIGH (ref 70–99)
Glucose-Capillary: 145 mg/dL — ABNORMAL HIGH (ref 70–99)
Glucose-Capillary: 137 mg/dL — ABNORMAL HIGH (ref 70–99)

## 2024-08-28 NOTE — Plan of Care (Signed)
 Noted swelling over hypogastric area around mons pubis. Bladder scan done = 90 ml. MD Pahwani notified.   Problem: Education: Goal: Knowledge of General Education information will improve Description: Including pain rating scale, medication(s)/side effects and non-pharmacologic comfort measures Outcome: Progressing   Problem: Health Behavior/Discharge Planning: Goal: Ability to manage health-related needs will improve Outcome: Progressing   Problem: Clinical Measurements: Goal: Ability to maintain clinical measurements within normal limits will improve Outcome: Progressing Goal: Will remain free from infection Outcome: Progressing Goal: Diagnostic test results will improve Outcome: Progressing Goal: Respiratory complications will improve Outcome: Progressing Goal: Cardiovascular complication will be avoided Outcome: Progressing   Problem: Activity: Goal: Risk for activity intolerance will decrease Outcome: Progressing   Problem: Nutrition: Goal: Adequate nutrition will be maintained Outcome: Progressing   Problem: Coping: Goal: Level of anxiety will decrease Outcome: Progressing   Problem: Elimination: Goal: Will not experience complications related to bowel motility Outcome: Progressing Goal: Will not experience complications related to urinary retention Outcome: Progressing   Problem: Pain Managment: Goal: General experience of comfort will improve and/or be controlled Outcome: Progressing   Problem: Safety: Goal: Ability to remain free from injury will improve Outcome: Progressing   Problem: Skin Integrity: Goal: Risk for impaired skin integrity will decrease Outcome: Progressing   Problem: Education: Goal: Ability to describe self-care measures that may prevent or decrease complications (Diabetes Survival Skills Education) will improve Outcome: Progressing Goal: Individualized Educational Video(s) Outcome: Progressing   Problem: Coping: Goal: Ability to  adjust to condition or change in health will improve Outcome: Progressing   Problem: Fluid Volume: Goal: Ability to maintain a balanced intake and output will improve Outcome: Progressing   Problem: Health Behavior/Discharge Planning: Goal: Ability to identify and utilize available resources and services will improve Outcome: Progressing Goal: Ability to manage health-related needs will improve Outcome: Progressing   Problem: Metabolic: Goal: Ability to maintain appropriate glucose levels will improve Outcome: Progressing   Problem: Nutritional: Goal: Maintenance of adequate nutrition will improve Outcome: Progressing Goal: Progress toward achieving an optimal weight will improve Outcome: Progressing   Problem: Skin Integrity: Goal: Risk for impaired skin integrity will decrease Outcome: Progressing   Problem: Tissue Perfusion: Goal: Adequacy of tissue perfusion will improve Outcome: Progressing

## 2024-08-28 NOTE — Consult Note (Addendum)
 WOC Nurse wound follow up Wound type: Refer to previous consult notes from 12/15.  Previously noted Unstageable pressure injuries to posterior head are slowly healing and are now 2 areas of red moist Stage 3 pressure injuries, separated by bridge of intact skin.  Each is approx 1X.5X.1cm and 1X.8X.1cm.     Topical treatment orders provided for bedside nurses to perform as follows to protect and promote healing: Foam dressing to posterior head, change Q 3 days or PRN soiling.     WOC Nurse team will reassess in 7-10 days to determine if a change is indicated in the plan of care at that time.    Stephane Fought MSN, RN, CWOCN, CWCN-AP, CNS Contact Mon-Fri 0700-1500: [phone]

## 2024-08-28 NOTE — Progress Notes (Signed)
 " PROGRESS NOTE    Alexandria Harvey  FMW:[id number] DOB: 08/09/1981 DOA: 08/11/2024 PCP: [facility]   Brief Narrative:  43 year old female- Under guardianship of care for DSS-phone #[phone]--- apparently also has a caregiver [name]/[name] who is the group home, DM, mental retardation, Schizoaffective disorder, previous hallucination, sacral decubitus ulcer, cervical fracture with collar - p/w fever and encephalopathy.   Assessment & Plan:   Principal Problem:   Petechial rash Active Problems:   Sepsis (HCC)   Aortic valve mass   Multiple fractures of thoracic spine, closed (HCC)   Closed fracture of multiple cervical vertebrae (HCC)   Drug rash   Cognitive developmental delay   Lactic acid acidosis   Schizoaffective disorder (HCC)  Sepsis, unclear etiology Presented with leukocytosis, lactic acidosis, concern for sepsis.  She has been afebrile here so far off antibiotics since 12/6. Blood cultures done off antibiotics on 12/8 and 12/9 NGTD  Got empiric antibiotics initiated at presentation.   -Evaluated by ID, Antibiotics stopped.  Currently on line holiday.   Autoimmune workup unremarkable with negative ANA, c-ANCA, p-ANCA, RF, Sjogren syndrome antibody A and B negative, negative lupus anticoagulant  -transient temp of 100.51F on 12/14. CXR clear. Repeat blood neg. Pt remains afebrile. Remains stable    Acute metabolic encephalopathy, resolved Possible polypharmacy, dehydration, ? sepsis, in the setting of intellectual delay. Medications weaned back.  Mental status noted to return back to baseline. Delirium precautions.   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   Evaluated by infectious disease.  Only minimal concern for infectious etiology, other differentials include Libman-Sacks, myxoma, clot.   Autoimmune workup so far negative (negative ANA, RA, ANCA).  Negative lupus anticoagulant panel.   Follow-up blood cultures  closely.  Cardiology team favoring repeat TTE echo in 3 months after she has been on anticoagulation to evaluate need for TEE Continue eliquis  therapy.   Acute rash Unclear etiology, thought to be possible drug reaction versus excoriations from bedbugs.  Antibiotics stopped.   -stable now   Unstageable sacral ulcer (POA) Wound 06/11/24 Pressure Injury Sacrum Mid Unstageable - Full thickness tissue loss in which the base of the injury is covered by slough (yellow, tan, gray, green or brown) and/or eschar (tan, brown or black) in the wound bed. (Active)     Wound 08/16/24 0259 Pressure Injury Head Posterior Unstageable - Full thickness tissue loss in which the base of the injury is covered by slough (yellow, tan, gray, green or brown) and/or eschar (tan, brown or black) in the wound bed. (Active)    Was previously on vancomycin  prior to visit to ED.  Wound evaluated by wound care.  Wound care per their recommendations.   Diabetes mellitus type 2 Currently on Lantus  10 units and SSI, blood sugar controlled.   History of cervical fractures with polytrauma Pt had been in c-collar.  MRI C and T spine were noted to be reassuring. C-spine collar removed as per neurosurgeon, Dr Valeda recommendations mentioned in his note on 12/12.   Acute DVT BL LE Lower extremity Dopplers noting BL proximal femoral vein DVTs.  Continue Eliquis .  Unclear if provoked or unprovoked.  Hypercoagulable workup, so far negative.   Severe constipation Noted incidentally on CT scan.  Continue bowel regimen. Encourage out of bed to chair.   Cognitive deficits/schizophrenia Resides at a group home.  guardianship of care for DSS-phone #[phone]--- apparently also has a caregiver [name]/[name] who is the group home.  Patient evaluated by psychiatry.  Medication recommendations per psych.   Hyponatremia -Normalized -cont to encourage po intake  DVT prophylaxis: SCDs Start: 08/12/24 9360   Code Status: Full Code   Family Communication: None present at bedside.   Status is: Inpatient Remains inpatient appropriate because: Medically stable, pending placement.   Estimated body mass index is 28.31 kg/m as calculated from the following:   Height as of this encounter: 5' 2 (1.575 m).   Weight as of this encounter: 70.2 kg.  Wound 06/11/24 Pressure Injury Sacrum Mid Unstageable - Full thickness tissue loss in which the base of the injury is covered by slough (yellow, tan, gray, green or brown) and/or eschar (tan, brown or black) in the wound bed. (Active)     Wound 08/16/24 0259 Pressure Injury Head Posterior Stage 3 -  Full thickness tissue loss. Subcutaneous fat may be visible but bone, tendon or muscle are NOT exposed. (Active)   Nutritional Assessment: Body mass index is 28.31 kg/m.[name] Seen by dietician.  I agree with the assessment and plan as outlined below: Nutrition Status:        . Skin Assessment: I have examined the patient's skin and I agree with the wound assessment as performed by the wound care RN as outlined below: Wound 06/11/24 Pressure Injury Sacrum Mid Unstageable - Full thickness tissue loss in which the base of the injury is covered by slough (yellow, tan, gray, green or brown) and/or eschar (tan, brown or black) in the wound bed. (Active)     Wound 08/16/24 0259 Pressure Injury Head Posterior Stage 3 -  Full thickness tissue loss. Subcutaneous fat may be visible but bone, tendon or muscle are NOT exposed. (Active)    Consultants:  Neurosurgery, cardiology, psychiatry, infectious disease  Procedures:  None  Antimicrobials:  Anti-infectives (From admission, onward)    Start     Dose/Rate Route Frequency Ordered Stop   08/12/24 1700  linezolid  (ZYVOX ) IVPB 600 mg  Status:  Discontinued        600 mg 300 mL/hr over 60 Minutes Intravenous Every 12 hours 08/12/24 0645 08/12/24 0823   08/12/24 1700  metroNIDAZOLE  (FLAGYL ) IVPB 500 mg  Status:  Discontinued        500  mg 100 mL/hr over 60 Minutes Intravenous Every 12 hours 08/12/24 0645 08/12/24 0823   08/12/24 1200  ceFEPIme  (MAXIPIME ) 2 g in sodium chloride  0.9 % 100 mL IVPB  Status:  Discontinued        2 g 200 mL/hr over 30 Minutes Intravenous Every 8 hours 08/12/24 0648 08/12/24 0823   08/12/24 0245  linezolid  (ZYVOX ) IVPB 600 mg        600 mg 300 mL/hr over 60 Minutes Intravenous  Once 08/12/24 0233 08/12/24 0453   08/12/24 0230  ceFEPIme  (MAXIPIME ) 2 g in sodium chloride  0.9 % 100 mL IVPB        2 g 200 mL/hr over 30 Minutes Intravenous  Once 08/12/24 0221 08/12/24 0343   08/12/24 0230  metroNIDAZOLE  (FLAGYL ) IVPB 500 mg        500 mg 100 mL/hr over 60 Minutes Intravenous  Once 08/12/24 0221 08/12/24 0603         Subjective: Patient seen and examined.  Continues to complain of aches and pains in different part of the body every day.  No other complaint.  Appears comfortable.  Appears oriented.  Objective: Vitals:   08/27/24 2003 08/28/24 0030 08/28/24 0416 08/28/24 0732  BP: 131/80 123/87 (!) 125/99  111/81  Pulse: (!) 101 (!) 107 (!) 102 92  Resp: 20 18 18 18   Temp: 97.7 F (36.5 C) 98.6 F (37 C) (!) 97.5 F (36.4 C) 98 F (36.7 C)  TempSrc: Oral Oral Oral   SpO2: 99% 97% 96% 95%  Weight:      Height:        Intake/Output Summary (Last 24 hours) at 08/28/2024 1020 Last data filed at 08/27/2024 1744 Gross per 24 hour  Intake 118 ml  Output 400 ml  Net -282 ml   Filed Weights   08/12/24 0600  Weight: 70.2 kg    Examination:  General exam: Appears calm and comfortable  Respiratory system: Clear to auscultation. Respiratory effort normal. Cardiovascular system: S1 & S2 heard, RRR. No JVD, murmurs, rubs, gallops or clicks. No pedal edema. Gastrointestinal system: Abdomen is nondistended, soft and nontender. No organomegaly or masses felt. Normal bowel sounds heard. Central nervous system: Alert and oriented. No focal neurological deficits.  Data Reviewed: I have  personally reviewed following labs and imaging studies  CBC: Recent Labs  Lab 08/24/24 0220 08/25/24 0531 08/27/24 0151 08/27/24 1326  WBC 10.4 11.5* 12.4* 13.2*  NEUTROABS  --   --   --  8.1*  HGB 12.0 9.6* 11.1* 10.3*  HCT 38.1 30.1* 35.5* 33.4*  MCV 81.1 80.3 79.8* 81.3  PLT 347 370 399 343   Basic Metabolic Panel: Recent Labs  Lab 08/22/24 0428 08/24/24 0220 08/25/24 0531 08/27/24 0151  NA 131* 130* 135 134*  K 4.4 5.4* 4.2 4.2  CL 96* 95* 100 99  CO2 19* 19* 23 20*  GLUCOSE 177* 190* 110* 175*  BUN <5* 6 6 6   CREATININE 0.36* 0.35* <0.30* 0.37*  CALCIUM  9.4 10.0 9.6 9.7   GFR: Estimated Creatinine Clearance: 84 mL/min (A) (by C-G formula based on SCr of 0.37 mg/dL (L)). Liver Function Tests: Recent Labs  Lab 08/22/24 0428 08/24/24 0220 08/25/24 0531 08/27/24 0151  AST 17 20 18 18   ALT 12 10 11 7   ALKPHOS 63 92 72 88  BILITOT 0.6 0.3 <0.2 <0.2  PROT 6.9 8.4* 6.9 7.8  ALBUMIN 2.2* 3.4* 3.2* 3.3*   No results for input(s): LIPASE, AMYLASE in the last 168 hours. No results for input(s): AMMONIA in the last 168 hours. Coagulation Profile: No results for input(s): INR, PROTIME in the last 168 hours. Cardiac Enzymes: No results for input(s): CKTOTAL, CKMB, CKMBINDEX, TROPONINI in the last 168 hours. BNP (last 3 results) No results for input(s): PROBNP in the last 8760 hours. HbA1C: No results for input(s): HGBA1C in the last 72 hours. CBG: Recent Labs  Lab 08/27/24 0833 08/27/24 1134 08/27/24 1547 08/27/24 2135 08/28/24 0734  GLUCAP 121* 165* 131* 139* 132*   Lipid Profile: No results for input(s): CHOL, HDL, LDLCALC, TRIG, CHOLHDL, LDLDIRECT in the last 72 hours. Thyroid  Function Tests: No results for input(s): TSH, T4TOTAL, FREET4, T3FREE, THYROIDAB in the last 72 hours. Anemia Panel: No results for input(s): VITAMINB12, FOLATE, FERRITIN, TIBC, IRON, RETICCTPCT in the last 72  hours. Sepsis Labs: No results for input(s): PROCALCITON, LATICACIDVEN in the last 168 hours.  Recent Results (from the past 240 hours)  Culture, blood (Routine X 2) w Reflex to ID Panel     Status: None   Collection Time: 08/20/24  9:08 AM   Specimen: BLOOD RIGHT HAND  Result Value Ref Range Status   Specimen Description BLOOD RIGHT HAND  Final   Special Requests   Final  BOTTLES DRAWN AEROBIC AND ANAEROBIC Blood Culture adequate volume   Culture   Final    NO GROWTH 5 DAYS Performed at [facility], [address], [place], [place] [address]    Report Status 08/25/2024 FINAL  Final  Culture, blood (Routine X 2) w Reflex to ID Panel     Status: None   Collection Time: 08/20/24  9:16 AM   Specimen: BLOOD LEFT HAND  Result Value Ref Range Status   Specimen Description BLOOD LEFT HAND  Final   Special Requests   Final    BOTTLES DRAWN AEROBIC AND ANAEROBIC Blood Culture adequate volume   Culture   Final    NO GROWTH 5 DAYS Performed at [facility], [address] [place], [place] [address]    Report Status 08/25/2024 FINAL  Final     Radiology Studies: No results found.  Scheduled Meds:  apixaban   5 mg Oral BID   cloZAPine   50 mg Oral BID   collagenase    Topical Daily   diphenhydrAMINE -zinc  acetate   Topical BID   divalproex   250 mg Oral TID   gabapentin   100 mg Oral TID   insulin  aspart  0-15 Units Subcutaneous TID WC   insulin  aspart  0-5 Units Subcutaneous QHS   insulin  glargine  10 Units Subcutaneous Daily   lubiprostone   24 mcg Oral Q breakfast   metFORMIN   500 mg Oral Q breakfast   polyethylene glycol  17 g Oral BID   senna-docusate  1 tablet Oral Daily   Continuous Infusions:   LOS: 16 days   Fredia Skeeter, MD [facility]  08/28/2024, 10:20 AM   *Please note that this is a verbal dictation therefore any spelling or grammatical errors are due to the Dragon Medical One system interpretation.  Please page via Amion and do not  message via secure chat for urgent patient care matters. Secure chat can be used for non urgent patient care matters.  How to contact the [facility] Attending or Consulting provider 7A - 7P or covering provider during after hours 7P -7A, for this patient?  Check the care team in [facility] and look for a) attending/consulting [facility] provider listed and b) the [facility] team listed. Page or secure chat 7A-7P. Log into [email or web address] and use [place]'s universal password to access. If you do not have the password, please contact the hospital operator. Locate the [facility] provider you are looking for under [facility] and page to a number that you can be directly reached. If you still have difficulty reaching the provider, please page the [facility] (Director on Call) for the Hospitalists listed on amion for assistance.  "

## 2024-08-29 DIAGNOSIS — R233 Spontaneous ecchymoses: Secondary | ICD-10-CM | POA: Diagnosis not present

## 2024-08-29 LAB — GLUCOSE, CAPILLARY
Glucose-Capillary: 165 mg/dL — ABNORMAL HIGH (ref 70–99)
Glucose-Capillary: 238 mg/dL — ABNORMAL HIGH (ref 70–99)
Glucose-Capillary: 123 mg/dL — ABNORMAL HIGH (ref 70–99)
Glucose-Capillary: 122 mg/dL — ABNORMAL HIGH (ref 70–99)

## 2024-08-29 NOTE — Progress Notes (Signed)
 " PROGRESS NOTE    Alexandria Harvey  FMW:[id number] DOB: 1981/06/24 DOA: 08/11/2024 PCP: [facility]   Brief Narrative:  43 year old female- Under guardianship of care for DSS-phone #[phone]--- apparently also has a caregiver [name]/[name] who is the group home, DM, mental retardation, Schizoaffective disorder, previous hallucination, sacral decubitus ulcer, cervical fracture with collar - p/w fever and encephalopathy.   Assessment & Plan:   Principal Problem:   Petechial rash Active Problems:   Sepsis (HCC)   Aortic valve mass   Multiple fractures of thoracic spine, closed (HCC)   Closed fracture of multiple cervical vertebrae (HCC)   Drug rash   Cognitive developmental delay   Lactic acid acidosis   Schizoaffective disorder (HCC)  Sepsis, unclear etiology Presented with leukocytosis, lactic acidosis, concern for sepsis.  She has been afebrile here so far off antibiotics since 12/6. Blood cultures done off antibiotics on 12/8 and 12/9 NGTD  Got empiric antibiotics initiated at presentation.   -Evaluated by ID, Antibiotics stopped.  Currently on line holiday.   Autoimmune workup unremarkable with negative ANA, c-ANCA, p-ANCA, RF, Sjogren syndrome antibody A and B negative, negative lupus anticoagulant  -transient temp of 100.46F on 12/14. CXR clear. Repeat blood neg. Pt remains afebrile. Remains stable    Acute metabolic encephalopathy, resolved Possible polypharmacy, dehydration, ? sepsis, in the setting of intellectual delay. Medications weaned back.  Mental status noted to return back to baseline. Delirium precautions.   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   Evaluated by infectious disease.  Only minimal concern for infectious etiology, other differentials include Libman-Sacks, myxoma, clot.   Autoimmune workup so far negative (negative ANA, RA, ANCA).  Negative lupus anticoagulant panel.   Follow-up blood cultures  closely.  Cardiology team favoring repeat TTE echo in 3 months after she has been on anticoagulation to evaluate need for TEE Continue eliquis  therapy.   Acute rash Unclear etiology, thought to be possible drug reaction versus excoriations from bedbugs.  Antibiotics stopped.   -stable now   Unstageable sacral ulcer (POA) Wound 06/11/24 Pressure Injury Sacrum Mid Unstageable - Full thickness tissue loss in which the base of the injury is covered by slough (yellow, tan, gray, green or brown) and/or eschar (tan, brown or black) in the wound bed. (Active)     Wound 08/16/24 0259 Pressure Injury Head Posterior Unstageable - Full thickness tissue loss in which the base of the injury is covered by slough (yellow, tan, gray, green or brown) and/or eschar (tan, brown or black) in the wound bed. (Active)    Was previously on vancomycin  prior to visit to ED.  Wound evaluated by wound care.  Wound care per their recommendations.   Diabetes mellitus type 2 Currently on Lantus  10 units and SSI, blood sugar controlled.   History of cervical fractures with polytrauma Pt had been in c-collar.  MRI C and T spine were noted to be reassuring. C-spine collar removed as per neurosurgeon, Dr Valeda recommendations mentioned in his note on 12/12.   Acute DVT BL LE Lower extremity Dopplers noting BL proximal femoral vein DVTs.  Continue Eliquis .  Unclear if provoked or unprovoked.  Hypercoagulable workup, so far negative.   Severe constipation Noted incidentally on CT scan.  Continue bowel regimen. Encourage out of bed to chair.   Cognitive deficits/schizophrenia Resides at a group home.  guardianship of care for DSS-phone #[phone]--- apparently also has a caregiver [name]/[name] who is the group home.  Patient evaluated by psychiatry.  Medication recommendations per psych.   Hyponatremia -Normalized -cont to encourage po intake  DVT prophylaxis: SCDs Start: 08/12/24 9360   Code Status: Full Code   Family Communication: None present at bedside.   Status is: Inpatient Remains inpatient appropriate because: Medically stable, pending placement.   Estimated body mass index is 28.31 kg/m as calculated from the following:   Height as of this encounter: 5' 2 (1.575 m).   Weight as of this encounter: 70.2 kg.  Wound 06/11/24 Pressure Injury Sacrum Mid Unstageable - Full thickness tissue loss in which the base of the injury is covered by slough (yellow, tan, gray, green or brown) and/or eschar (tan, brown or black) in the wound bed. (Active)     Wound 08/16/24 0259 Pressure Injury Head Posterior Stage 3 -  Full thickness tissue loss. Subcutaneous fat may be visible but bone, tendon or muscle are NOT exposed. (Active)   Nutritional Assessment: Body mass index is 28.31 kg/m.[name] Seen by dietician.  I agree with the assessment and plan as outlined below: Nutrition Status:        . Skin Assessment: I have examined the patient's skin and I agree with the wound assessment as performed by the wound care RN as outlined below: Wound 06/11/24 Pressure Injury Sacrum Mid Unstageable - Full thickness tissue loss in which the base of the injury is covered by slough (yellow, tan, gray, green or brown) and/or eschar (tan, brown or black) in the wound bed. (Active)     Wound 08/16/24 0259 Pressure Injury Head Posterior Stage 3 -  Full thickness tissue loss. Subcutaneous fat may be visible but bone, tendon or muscle are NOT exposed. (Active)    Consultants:  Neurosurgery, cardiology, psychiatry, infectious disease  Procedures:  None  Antimicrobials:  Anti-infectives (From admission, onward)    Start     Dose/Rate Route Frequency Ordered Stop   08/12/24 1700  linezolid  (ZYVOX ) IVPB 600 mg  Status:  Discontinued        600 mg 300 mL/hr over 60 Minutes Intravenous Every 12 hours 08/12/24 0645 08/12/24 0823   08/12/24 1700  metroNIDAZOLE  (FLAGYL ) IVPB 500 mg  Status:  Discontinued        500  mg 100 mL/hr over 60 Minutes Intravenous Every 12 hours 08/12/24 0645 08/12/24 0823   08/12/24 1200  ceFEPIme  (MAXIPIME ) 2 g in sodium chloride  0.9 % 100 mL IVPB  Status:  Discontinued        2 g 200 mL/hr over 30 Minutes Intravenous Every 8 hours 08/12/24 0648 08/12/24 0823   08/12/24 0245  linezolid  (ZYVOX ) IVPB 600 mg        600 mg 300 mL/hr over 60 Minutes Intravenous  Once 08/12/24 0233 08/12/24 0453   08/12/24 0230  ceFEPIme  (MAXIPIME ) 2 g in sodium chloride  0.9 % 100 mL IVPB        2 g 200 mL/hr over 30 Minutes Intravenous  Once 08/12/24 0221 08/12/24 0343   08/12/24 0230  metroNIDAZOLE  (FLAGYL ) IVPB 500 mg        500 mg 100 mL/hr over 60 Minutes Intravenous  Once 08/12/24 0221 08/12/24 0603         Subjective: Patient seen and examined with the nurse/female chaperone present.  Another nurse informed me yesterday that she had found some puffiness in the suprapubic area.  I personally examined the patient with the nurse present.  That puffiness is nothing but the fat.  No other abnormality found.  Objective: Vitals:   08/28/24 2012 08/29/24 0001 08/29/24 0440 08/29/24 0733  BP: 120/82 (!) 139/92 133/88 (!) 117/95  Pulse: (!) 101 (!) 103 100 97  Resp: 20 20 18 16   Temp: 98.4 F (36.9 C) 98 F (36.7 C) 98.3 F (36.8 C) 97.9 F (36.6 C)  TempSrc: Oral Oral Oral Oral  SpO2: 100% 100% 100% 100%  Weight:      Height:        Intake/Output Summary (Last 24 hours) at 08/29/2024 1122 Last data filed at 08/29/2024 0901 Gross per 24 hour  Intake 716 ml  Output 700 ml  Net 16 ml   Filed Weights   08/12/24 0600  Weight: 70.2 kg    Examination:  General exam: Appears calm and comfortable  Respiratory system: Clear to auscultation. Respiratory effort normal. Cardiovascular system: S1 & S2 heard, RRR. No JVD, murmurs, rubs, gallops or clicks. No pedal edema. Gastrointestinal system: Abdomen is nondistended, soft and nontender. No organomegaly or masses felt. Normal bowel  sounds heard. Central nervous system: Alert and oriented. No focal neurological deficits.  Data Reviewed: I have personally reviewed following labs and imaging studies  CBC: Recent Labs  Lab 08/24/24 0220 08/25/24 0531 08/27/24 0151 08/27/24 1326  WBC 10.4 11.5* 12.4* 13.2*  NEUTROABS  --   --   --  8.1*  HGB 12.0 9.6* 11.1* 10.3*  HCT 38.1 30.1* 35.5* 33.4*  MCV 81.1 80.3 79.8* 81.3  PLT 347 370 399 343   Basic Metabolic Panel: Recent Labs  Lab 08/24/24 0220 08/25/24 0531 08/27/24 0151  NA 130* 135 134*  K 5.4* 4.2 4.2  CL 95* 100 99  CO2 19* 23 20*  GLUCOSE 190* 110* 175*  BUN 6 6 6   CREATININE 0.35* <0.30* 0.37*  CALCIUM  10.0 9.6 9.7   GFR: Estimated Creatinine Clearance: 84 mL/min (A) (by C-G formula based on SCr of 0.37 mg/dL (L)). Liver Function Tests: Recent Labs  Lab 08/24/24 0220 08/25/24 0531 08/27/24 0151  AST 20 18 18   ALT 10 11 7   ALKPHOS 92 72 88  BILITOT 0.3 <0.2 <0.2  PROT 8.4* 6.9 7.8  ALBUMIN 3.4* 3.2* 3.3*   No results for input(s): LIPASE, AMYLASE in the last 168 hours. No results for input(s): AMMONIA in the last 168 hours. Coagulation Profile: No results for input(s): INR, PROTIME in the last 168 hours. Cardiac Enzymes: No results for input(s): CKTOTAL, CKMB, CKMBINDEX, TROPONINI in the last 168 hours. BNP (last 3 results) No results for input(s): PROBNP in the last 8760 hours. HbA1C: No results for input(s): HGBA1C in the last 72 hours. CBG: Recent Labs  Lab 08/28/24 0734 08/28/24 1153 08/28/24 1641 08/28/24 2014 08/29/24 0735  GLUCAP 132* 229* 145* 137* 165*   Lipid Profile: No results for input(s): CHOL, HDL, LDLCALC, TRIG, CHOLHDL, LDLDIRECT in the last 72 hours. Thyroid  Function Tests: No results for input(s): TSH, T4TOTAL, FREET4, T3FREE, THYROIDAB in the last 72 hours. Anemia Panel: No results for input(s): VITAMINB12, FOLATE, FERRITIN, TIBC, IRON, RETICCTPCT  in the last 72 hours. Sepsis Labs: No results for input(s): PROCALCITON, LATICACIDVEN in the last 168 hours.  Recent Results (from the past 240 hours)  Culture, blood (Routine X 2) w Reflex to ID Panel     Status: None   Collection Time: 08/20/24  9:08 AM   Specimen: BLOOD RIGHT HAND  Result Value Ref Range Status   Specimen Description BLOOD RIGHT HAND  Final   Special Requests   Final  BOTTLES DRAWN AEROBIC AND ANAEROBIC Blood Culture adequate volume   Culture   Final    NO GROWTH 5 DAYS Performed at [facility], [address] [place], [place] [address]    Report Status 08/25/2024 FINAL  Final  Culture, blood (Routine X 2) w Reflex to ID Panel     Status: None   Collection Time: 08/20/24  9:16 AM   Specimen: BLOOD LEFT HAND  Result Value Ref Range Status   Specimen Description BLOOD LEFT HAND  Final   Special Requests   Final    BOTTLES DRAWN AEROBIC AND ANAEROBIC Blood Culture adequate volume   Culture   Final    NO GROWTH 5 DAYS Performed at [facility], [address] [place], [place] [address]    Report Status 08/25/2024 FINAL  Final     Radiology Studies: No results found.  Scheduled Meds:  apixaban   5 mg Oral BID   cloZAPine   50 mg Oral BID   collagenase    Topical Daily   diphenhydrAMINE -zinc  acetate   Topical BID   divalproex   250 mg Oral TID   gabapentin   100 mg Oral TID   insulin  aspart  0-15 Units Subcutaneous TID WC   insulin  aspart  0-5 Units Subcutaneous QHS   insulin  glargine  10 Units Subcutaneous Daily   lubiprostone   24 mcg Oral Q breakfast   metFORMIN   500 mg Oral Q breakfast   polyethylene glycol  17 g Oral BID   senna-docusate  1 tablet Oral Daily   Continuous Infusions:   LOS: 17 days   Fredia Skeeter, MD [facility]  08/29/2024, 11:22 AM   *Please note that this is a verbal dictation therefore any spelling or grammatical errors are due to the Dragon Medical One system interpretation.  Please page via  Amion and do not message via secure chat for urgent patient care matters. Secure chat can be used for non urgent patient care matters.  How to contact the [facility] Attending or Consulting provider 7A - 7P or covering provider during after hours 7P -7A, for this patient?  Check the care team in [facility] and look for a) attending/consulting [facility] provider listed and b) the [facility] team listed. Page or secure chat 7A-7P. Log into [email or web address] and use [place]'s universal password to access. If you do not have the password, please contact the hospital operator. Locate the [facility] provider you are looking for under [facility] and page to a number that you can be directly reached. If you still have difficulty reaching the provider, please page the [facility] (Director on Call) for the Hospitalists listed on amion for assistance.  "

## 2024-08-29 NOTE — Plan of Care (Signed)
" °  Problem: Education: Goal: Knowledge of General Education information will improve Description: Including pain rating scale, medication(s)/side effects and non-pharmacologic comfort measures Outcome: Progressing   Problem: Health Behavior/Discharge Planning: Goal: Ability to manage health-related needs will improve Outcome: Progressing   Problem: Clinical Measurements: Goal: Ability to maintain clinical measurements within normal limits will improve Outcome: Progressing Goal: Will remain free from infection Outcome: Progressing Goal: Cardiovascular complication will be avoided Outcome: Progressing   Problem: Activity: Goal: Risk for activity intolerance will decrease Outcome: Progressing   Problem: Nutrition: Goal: Adequate nutrition will be maintained Outcome: Progressing   Problem: Coping: Goal: Level of anxiety will decrease Outcome: Progressing   Problem: Elimination: Goal: Will not experience complications related to bowel motility Outcome: Progressing Goal: Will not experience complications related to urinary retention Outcome: Progressing   Problem: Pain Managment: Goal: General experience of comfort will improve and/or be controlled Outcome: Progressing   Problem: Safety: Goal: Ability to remain free from injury will improve Outcome: Progressing   Problem: Skin Integrity: Goal: Risk for impaired skin integrity will decrease Outcome: Progressing   Problem: Coping: Goal: Ability to adjust to condition or change in health will improve Outcome: Progressing   Problem: Health Behavior/Discharge Planning: Goal: Ability to identify and utilize available resources and services will improve Outcome: Progressing Goal: Ability to manage health-related needs will improve Outcome: Progressing   Problem: Metabolic: Goal: Ability to maintain appropriate glucose levels will improve Outcome: Progressing   Problem: Nutritional: Goal: Maintenance of adequate  nutrition will improve Outcome: Progressing Goal: Progress toward achieving an optimal weight will improve Outcome: Progressing   Problem: Skin Integrity: Goal: Risk for impaired skin integrity will decrease Outcome: Progressing   Problem: Tissue Perfusion: Goal: Adequacy of tissue perfusion will improve Outcome: Progressing   "

## 2024-08-29 NOTE — TOC Progression Note (Signed)
 Transition of Care [employer]) - Progression Note    Patient Details  Name: Alexandria Harvey MRN: [id number] Date of Birth: [birth year]  [facility] CM/SW Contact  Sherline Clack, [place] Phone Number: 08/29/2024, 5:18 PM  Clinical Narrative:     CSW left voicemail with Nat Duos to follow up on discharge plan. CSW also left VM with Glendale Pereyra, patient's mother. Nat had previously told CSW patient's mother is involved in her care and allowed to receive updates. CSW will continue to follow.   Expected Discharge Plan: Skilled Nursing Facility Barriers to Discharge: Insurance Authorization, SNF Pending bed offer               Expected Discharge Plan and Services       Living arrangements for the past 2 months: Skilled Nursing Facility, Group Home                                       Social Drivers of Health (SDOH) Interventions SDOH Screenings   Food Insecurity: No Food Insecurity (06/11/2024)  Housing: Low Risk (06/11/2024)  Transportation Needs: No Transportation Needs (06/11/2024)  Utilities: Not At Risk (06/11/2024)  Financial Resource Strain: High Risk (02/29/2024)   Received from [facility]  Physical Activity: Unknown (02/29/2024)   Received from [facility]  Social Connections: Moderately Integrated (02/29/2024)   Received from [facility]  Stress: No Stress Concern Present (02/29/2024)   Received from [facility]  Tobacco Use: Low Risk (08/15/2024)    Readmission Risk Interventions    06/13/2024    9:35 AM  Readmission Risk Prevention Plan  Transportation Screening Complete  PCP or Specialist Appt within 5-7 Days Complete  Home Care Screening Complete  Medication Review (RN CM) Complete

## 2024-08-29 NOTE — Progress Notes (Signed)
 Physical Therapy Treatment Patient Details Name: Alexandria Harvey MRN: [id number] DOB: [birth year] Today's Date: 08/29/2024   History of Present Illness 43 y.o. female admitted 08/11/24 for petechial rash suspect adverse drug reaction to Zosyn . 12/6 (+) DVT in BLE. PMHx: intellectual disability, schizophrenia, dropped head syndrome, HTN, seizures, diabetes, hx of C3, C5, C7 fxs with central cord syndrome, hx of T4-T5 fxs, and L foot/ankle fx.    PT Comments  Pt received in supine and agreeable to session. Pt demonstrates improved tolerance and participation this session, however continues to be significantly limited by pain. Pt demonstrates BUE flexor tone with sharp pain with attempts to extend elbows and fingers. Pt able to perform BLE exercises with increased pain in LLE, but pt able to tolerate with encouragement. Noted RLE hip external rotation with increased stiffness, so pt educated on placement of blanket roll to keep hip in neutral at rest. Pt continues to benefit from PT services to progress toward functional mobility goals.    If plan is discharge home, recommend the following: Two people to help with walking and/or transfers;Two people to help with bathing/dressing/bathroom;Assistance with cooking/housework;Assist for transportation;Help with stairs or ramp for entrance;Supervision due to cognitive status;Direct supervision/assist for medications management   Can travel by private vehicle     No  Equipment Recommendations  Hospital bed;Hoyer lift;Other (comment) (air mattress; tilt-n-space w/c)    Recommendations for Other Services       Precautions / Restrictions Precautions Precautions: Cervical;Fall Recall of Precautions/Restrictions: Impaired Restrictions Other Position/Activity Restrictions: No brace needed per Neurosurgery note on 12/12     Mobility  Bed Mobility Overal bed mobility: Needs Assistance Bed Mobility: Rolling Rolling: Max assist, +2 for physical  assistance         General bed mobility comments: Roll to L with pt able to reach for rail with RUE with assist to complete roll    Transfers                        Ambulation/Gait                   Stairs             Wheelchair Mobility     Tilt Bed    Modified Rankin (Stroke Patients Only)       Balance Overall balance assessment: Needs assistance                                          Communication Communication Communication: Impaired Factors Affecting Communication: Reduced clarity of speech  Cognition Arousal: Alert Behavior During Therapy: Anxious, Lability   PT - Cognitive impairments: History of cognitive impairments, Problem solving, Attention                       PT - Cognition Comments: Increased encouragement and redirection required due to pain Following commands: Impaired Following commands impaired: Only follows one step commands consistently, Follows one step commands with increased time, Follows multi-step commands inconsistently    Cueing Cueing Techniques: Verbal cues, Gestural cues, Tactile cues, Visual cues  Exercises General Exercises - Lower Extremity Heel Slides: AROM, AAROM, Both, 5 reps, Supine (AAROM for LLE to increase range) Straight Leg Raises: AROM, Supine, Both, 5 reps Other Exercises Other Exercises: PROM B elbows and fingers due to flexor tone    General  Comments        Pertinent Vitals/Pain Pain Assessment Pain Assessment: Faces Faces Pain Scale: Hurts even more Pain Location: BUE and LLE with ROM Pain Descriptors / Indicators: Sharp, Aching, Crying, Grimacing, Guarding Pain Intervention(s): Limited activity within patient's tolerance, Monitored during session, Repositioned     PT Goals (current goals can now be found in the care plan section) Acute Rehab PT Goals PT Goal Formulation: Patient unable to participate in goal setting Time For Goal Achievement:  08/27/24 Progress towards PT goals: Progressing toward goals    Frequency    Min 1X/week       AM-PAC PT 6 Clicks Mobility   Outcome Measure  Help needed turning from your back to your side while in a flat bed without using bedrails?: Total Help needed moving from lying on your back to sitting on the side of a flat bed without using bedrails?: Total Help needed moving to and from a bed to a chair (including a wheelchair)?: Total Help needed standing up from a chair using your arms (e.g., wheelchair or bedside chair)?: Total Help needed to walk in hospital room?: Total Help needed climbing 3-5 steps with a railing? : Total 6 Click Score: 6    End of Session   Activity Tolerance: Patient limited by pain;Patient tolerated treatment well Patient left: in bed;with call bell/phone within reach;with nursing/sitter in room Nurse Communication: Mobility status PT Visit Diagnosis: Pain;Other symptoms and signs involving the nervous system (R29.898);Muscle weakness (generalized) (M62.81);Other abnormalities of gait and mobility (R26.89)     Time: 8497-8469 PT Time Calculation (min) (ACUTE ONLY): 28 min  Charges:    $Therapeutic Exercise: 8-22 mins $Therapeutic Activity: 8-22 mins PT General Charges $$ ACUTE PT VISIT: 1 Visit                    Darryle George, PTA Acute Rehabilitation Services Secure Chat Preferred  [phone]    Darryle George 08/29/2024, 4:07 PM

## 2024-08-30 DIAGNOSIS — R233 Spontaneous ecchymoses: Secondary | ICD-10-CM | POA: Diagnosis not present

## 2024-08-30 DIAGNOSIS — K592 Neurogenic bowel, not elsewhere classified: Secondary | ICD-10-CM

## 2024-08-30 DIAGNOSIS — R252 Cramp and spasm: Secondary | ICD-10-CM

## 2024-08-30 DIAGNOSIS — G8250 Quadriplegia, unspecified: Secondary | ICD-10-CM

## 2024-08-30 DIAGNOSIS — N319 Neuromuscular dysfunction of bladder, unspecified: Secondary | ICD-10-CM

## 2024-08-30 DIAGNOSIS — Z9189 Other specified personal risk factors, not elsewhere classified: Secondary | ICD-10-CM

## 2024-08-30 LAB — HEMOGLOBIN A1C
Hgb A1c MFr Bld: 7 % — ABNORMAL HIGH (ref 4.8–5.6)
Mean Plasma Glucose: 154.2 mg/dL

## 2024-08-30 LAB — GLUCOSE, CAPILLARY
Glucose-Capillary: 104 mg/dL — ABNORMAL HIGH (ref 70–99)
Glucose-Capillary: 106 mg/dL — ABNORMAL HIGH (ref 70–99)
Glucose-Capillary: 145 mg/dL — ABNORMAL HIGH (ref 70–99)
Glucose-Capillary: 133 mg/dL — ABNORMAL HIGH (ref 70–99)

## 2024-08-30 MED ORDER — METFORMIN HCL ER 500 MG PO TB24
500.0000 mg | ORAL_TABLET | Freq: Two times a day (BID) | ORAL | Status: DC
  Administered 2024-08-30 – 2024-09-01 (×5): 500 mg via ORAL
  Filled 2024-08-30 (×6): qty 1

## 2024-08-30 NOTE — Consult Note (Addendum)
 "     Physical Medicine and Rehabilitation Consult Reason for Consult: SCI recommendations  Referring Physician: Dr. Vernon   HPI: Alexandria Harvey is a 43 y.o. female with PMH of diabetes, schizophrenia, hypertension, hyperlipidemia, central cord spinal cord injury after reported fall in September with T4/T5 fracture, C3, C5, C7 posterior elements fracture who was admitted for AMS from a skilled nursing facility.  Has a sacral decubitus ulcer.  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  Patient had an MRI of her C-spine which showed more discrete T2 hyperintensity at C3/4 with cord volume loss compatible with myelomalacia, persistent but improved edema interspinous ligaments at C3-4 to C6-7, mild to moderate spinal canal stenosis at C5-6, mild canal stenosis C4-5 and C6-7.  MRI of her T-spine with healing T4, T5 and T6 fractures, similar extension into the posterior elements of T4.  Bony retropulsion of T4-T5 appears similar to prior with mild mass effect on the cord.  Patient was seen by Dr. Lanis on 12/12, felt to be overall neurologically unchanged.  Dr. Lanis indicated it was okay to discontinue cervical collar.  Patient was treated for sepsis with antibiotics.  She was evaluated by ID for recommendations and now antibiotics are discontinued.  Patient with acute metabolic encephalopathy in addition to chronic cognitive deficits.  She was noted to have constipation on CT, has been incontinent of bowels.  Also incontinent of bladder with large volume voids recorded.  She has been evaluated by psychiatry and medication adjustments were made.  Patient is limited historian other family came in at end of the visit and were able to provide background.  Patient has chronic cognitive dysfunction however was ambulatory with overall good strength in her extremities and and able to help with ADLs and manage her bowel and bladder until her fall and resulting spinal cord injury.   Patient has been working with PT requiring max assist, +2 for rolling.  Worked with OT requiring max assist for eating, max assist for grooming.   Home: Home Living Family/patient expects to be discharged to:: Skilled nursing facility Additional Comments: Prior to SCI in 05/2024, pt living in a group home. Since hospitalization in 05/2024, pt has been at [facility] for rehab. Plan to return to SNF for continued rehab.  Functional History: Prior Function Prior Level of Function : Needs assist Mobility Comments: Since SCI in 05/2024, pt largely requiring Total assist +1 to +2 for bed mobility. ADLs Comments: Since SCI in 05/2024, pt largely requiring Total assist +1 to +2 for ADLs and receives assist for all IADLs. Pt enjoys watching [employer] and the 1960s Christmas movie [employer] Functional Status:  Mobility: Bed Mobility Overal bed mobility: Needs Assistance Bed Mobility: Rolling Rolling: Max assist, +2 for physical assistance General bed mobility comments: Roll to L with pt able to reach for rail with RUE with assist to complete roll Transfers Overall transfer level: Needs assistance General transfer comment: Deferred d/t fatigue. Recommend maximove to transfer OOB with +2 assist.      ADL: ADL Overall ADL's : Needs assistance/impaired Eating/Feeding: Maximal assistance, With adaptive utensils, Bed level Eating/Feeding Details (indicate cue type and reason): patient required assistance to load utensils and was able to bring to mouth with occasional assistance to steady hand or guide to mouth Grooming: Wash/dry face, Maximal assistance Grooming Details (indicate cue type and reason): hand over hand General ADL Comments: increased ability to hold spoon with red foam built up handle  Cognition: Cognition Orientation Level: Oriented to person, Oriented to situation, Oriented to place Cognition Arousal: Alert Behavior During Therapy: Anxious, Lability   Review  of Systems  Reason unable to perform ROS: limited by cognition.  Respiratory:  Negative for shortness of breath.   Cardiovascular:  Negative for chest pain.  Gastrointestinal:  Negative for abdominal pain.   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 She has never used smokeless tobacco. She reports that she does not drink alcohol and does not use drugs. Allergies: Allergies[1] Medications Prior to Admission  Medication Sig Dispense Refill   acetaminophen  (TYLENOL ) 500 MG tablet Take 2 tablets (1,000 mg total) by mouth every 8 (eight) hours as needed.     Amino Acids-Protein Hydrolys (FEEDING SUPPLEMENT, PRO-STAT SUGAR FREE 64,) LIQD Take 30 mLs by mouth in the morning and at bedtime.     apixaban  (ELIQUIS ) 5 MG TABS tablet Take 5 mg by mouth 2 (two) times daily. For 3 months     [Paused] atenolol  (TENORMIN ) 50 MG tablet Take 1 tablet (50 mg total) by mouth daily. (Patient taking differently: Take 75 mg by mouth daily.) 2 tablet 0   atorvastatin  (LIPITOR ) 80 MG tablet Take 1 tablet (80 mg total) by mouth at bedtime. 2 tablet 0   benztropine  (COGENTIN ) 1 MG tablet Take 1 mg by mouth 2 (two) times daily.     busPIRone  (BUSPAR ) 5 MG tablet Take 5 mg by mouth 3 (three) times daily.     calcium -vitamin D  (OSCAL WITH D) 500-5 MG-MCG tablet Take 1 tablet by mouth 2 (two) times daily.     cloZAPine  (CLOZARIL ) 100 MG tablet Take 100 mg by mouth 2 (two) times daily.      divalproex   (DEPAKOTE ) 250 MG DR tablet Take 3 tablets (750 mg total) by mouth 3 (three) times daily.     ezetimibe  (ZETIA ) 10 MG tablet Take 10 mg by mouth daily.     gabapentin  (NEURONTIN ) 300 MG capsule Take 1 capsule (300 mg total) by mouth 3 (three) times daily.     lubiprostone  (AMITIZA ) 24 MCG capsule Take 1 capsule (24 mcg total) by mouth daily with breakfast.     methocarbamol  (ROBAXIN ) 500 MG tablet Take 2 tablets (1,000 mg total) by mouth every 8 (eight) hours as needed for muscle spasms.     omeprazole (PRILOSEC) 20 MG capsule Take 20 mg by mouth in the morning and at bedtime.     polyethylene glycol (MIRALAX  / GLYCOLAX ) packet Take 17 g by mouth 2 (two) times daily. 60 packet 1   predniSONE (DELTASONE) 20 MG tablet Take 20 mg by mouth daily with breakfast. For 5 days     sennosides-docusate sodium  (SENOKOT-S) 8.6-50 MG tablet Take 1 tablet by mouth daily.     simethicone  (MYLICON) 80 MG chewable tablet Chew 80 mg by mouth 2 (two) times daily.  Vitamin D , Ergocalciferol , (DRISDOL ) 1.25 MG (50000 UNIT) CAPS capsule Take 1 capsule (50,000 Units total) by mouth once a week. (Patient taking differently: Take 50,000 Units by mouth once a week. Thursdays)     zinc  oxide (BALMEX) 11.3 % CREA cream Apply 1 Application topically as needed.     collagenase  (SANTYL ) 250 UNIT/GM ointment Apply topically daily. (Patient not taking: Reported on 08/12/2024)     naloxone  (NARCAN ) 0.4 MG/ML injection Inject 1 mL (0.4 mg total) into the vein as needed. (Patient not taking: Reported on 06/11/2024)     oxyCODONE  (OXY IR/ROXICODONE ) 5 MG immediate release tablet Take 0.5-1 tablets (2.5-5 mg total) by mouth every 6 (six) hours as needed for breakthrough pain (2.5 mg for moderate, 5 mg for severe). (Patient not taking: Reported on 08/12/2024) 10 tablet 0   piperacillin -tazobactam (ZOSYN ) 3.375 GM/50ML IVPB Inject 3.375 g into the vein every 8 (eight) hours. For 6 weeks (Patient not taking: Reported on 08/12/2024)        Blood pressure 126/88, pulse (!) 109, temperature 97.7 F (36.5 C), temperature source Oral, resp. rate 20, height 5' 2 (1.575 m), weight 70.2 kg, SpO2 98%. Physical Exam   General: No apparent distress, laying in bed a little drowsy HEENT: scar of forehead, sclera anicteric, oral mucosa pink and moist Neck: Supple without JVD or lymphadenopathy Heart: Reg rate and rhythm. No murmurs rubs or gallops Chest: CTA , nonlabored breathing Abdomen: Soft, non-tender, midly-distended, bowel sounds positive. Extremities: No clubbing, cyanosis, or edema.  Psych: Pt's affect is flat Skin: dressing on b/l heels Neuro:    Mental Status: AAOx to person, year and hospital, not month.  Poor insight.  Cognitive/memory deficits present Speech/Languate: occasional follows simple commands.  Will answer some questions with encouragement.  Answers questions better for family than when I ask her these questions. CRANIAL NERVES: 2 through 12 grossly intact   MOTOR: RUE: Grossly 2-3, was able to touch her chin with her hand LUE: Grossly 1-2 RLE: Grossly 2-3 LLE: Grossly 1-2 MMT limited by cooperation/effort with exam  REFLEXES:  Hyperreflexive in b/l UE and LE, primarily increased flexor tone in bilateral upper extremities and extensor tone in lower extremities-exam limited by patient's severe report of discomfort with movement of her arms or legs Hoffmans positive on R   SENSORY: Patient indicates sensation intact light touch intact in bilateral upper and lower extremities  MSK: Ankle plantarflexion contraction Tender thoughout b/l UE and LE-appears to reports severe pain with any movement or palpation of her arms or legs    Results for orders placed or performed during the hospital encounter of 08/11/24 (from the past 24 hours)  Glucose, capillary     Status: Abnormal   Collection Time: 08/29/24  5:30 PM  Result Value Ref Range   Glucose-Capillary 123 (H) 70 - 99 mg/dL  Glucose,  capillary     Status: Abnormal   Collection Time: 08/29/24  7:57 PM  Result Value Ref Range   Glucose-Capillary 122 (H) 70 - 99 mg/dL  Glucose, capillary     Status: Abnormal   Collection Time: 08/30/24  7:53 AM  Result Value Ref Range   Glucose-Capillary 104 (H) 70 - 99 mg/dL   No results found.  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   Neurogenic bowel and bladder 3.   Spasticity 4.   Schizoaffective disorder and Chronic cognitive deficits 5.   Risk of autonomic dysreflexia  MEDICAL RECOMMENDATIONS: Would consider trying SCI bowel program to see if  this could provide more regular bowel movements and help with incontinence.  (rectal suppository then wait 10-15 min, after this can do digital stimulation using a lubricated, gloved finger to gently rotate in small circles in the rectum, triggering the bowel reflex for stool release) Patient has had some elevated bladder volumes recorded.  Would check bladder scan/PVR every 4-6 hours with IC if volumes greater than 350 mL.  Could try Flomax if blood pressure not prohibitive and she has retention.  If she has continued retention (patients with upper motor SCI often have detrusor sphincter dyssynergia) and she does not have someone who can perform regular IC she would likely benefit from Foley catheter.  In either case would likely be beneficial for her to follow-up with urology outpatient. Continue pressure relief and frequent repositioning to help with pressure injury healing Consider trying muscle relaxer such as baclofen  5 mg 3 times daily to help with spasticity Could consider premedicating before PT OT sessions to help with pain medication Could consider increasing dose of gabapentin  to 200 mg 3 times daily to help with neuropathic pain.  Will do on a different day than initiating baclofen  as both can cause sedation.  Could consider increasing to higher dose higher if tolerated and beneficial. Monitor for signs of autonomic  dysreflexia which causes elevated blood pressure, pounding headache, flushing, sweating, high blood pressure, and slow pulse. It is often triggered by any painful or irritating stimulus below the injury (usually bowel/bladder/skin) issues.  Think should would benefit from f/u PM&R clinic regarding SCI issue  Thanks,  Murray Collier, MD 08/30/2024     [1]  Allergies Allergen Reactions   Zosyn  [Piperacillin -Tazobactam In Dex] Rash    Petechial rash noted, ID was consulted   Haloperidol Lactate Other (See Comments)    Unknown reaction   Shrimp [Shellfish Allergy] Hives and Rash    All Shellfish per [facility]   Strawberry (Diagnostic) Rash   "

## 2024-08-30 NOTE — Plan of Care (Signed)
  Problem: Education: Goal: Knowledge of General Education information will improve Description: Including pain rating scale, medication(s)/side effects and non-pharmacologic comfort measures Outcome: Progressing   Problem: Clinical Measurements: Goal: Ability to maintain clinical measurements within normal limits will improve Outcome: Progressing   Problem: Nutrition: Goal: Adequate nutrition will be maintained Outcome: Progressing   Problem: Elimination: Goal: Will not experience complications related to bowel motility Outcome: Progressing   Problem: Pain Managment: Goal: General experience of comfort will improve and/or be controlled Outcome: Progressing   Problem: Safety: Goal: Ability to remain free from injury will improve Outcome: Progressing

## 2024-08-30 NOTE — TOC Progression Note (Addendum)
 Transition of Care [facility]) - Progression Note    Patient Details  Name: Alexandria Harvey MRN: [id number] Date of Birth: [birth year]  [facility] CM/SW Contact  Alexandria Harvey Saa, LCSWA Phone Number: 08/30/2024, 9:27 AM  Clinical Narrative:     9:31 AM CSW introduced self and role to patient's legal guardian, Alexandria Harvey confirmed bed acceptance for [facility] and stated that she would initiate process of transitioning patient's Medicaid from [facility] to [facility] when she returns to the office Monday but that patient has funds to pay copays. CSW made [facility] aware. CSW will continue to follow.  Expected Discharge Plan: Skilled Nursing Facility Barriers to Discharge: Insurance Authorization, SNF Pending bed offer               Expected Discharge Plan and Services       Living arrangements for the past 2 months: Skilled Nursing Facility, Group Home                                       Social Drivers of Health (SDOH) Interventions SDOH Screenings   Food Insecurity: No Food Insecurity (06/11/2024)  Housing: Low Risk (06/11/2024)  Transportation Needs: No Transportation Needs (06/11/2024)  Utilities: Not At Risk (06/11/2024)  Financial Resource Strain: High Risk (02/29/2024)   Received from [facility]  Physical Activity: Unknown (02/29/2024)   Received from [facility]  Social Connections: Moderately Integrated (02/29/2024)   Received from [facility]  Stress: No Stress Concern Present (02/29/2024)   Received from [facility]  Tobacco Use: Low Risk (08/15/2024)    Readmission Risk Interventions    06/13/2024    9:35 AM  Readmission Risk Prevention Plan  Transportation Screening Complete  PCP or Specialist Appt within 5-7 Days Complete  Home Care Screening Complete  Medication Review (RN CM) Complete

## 2024-08-30 NOTE — Plan of Care (Signed)

## 2024-08-30 NOTE — Progress Notes (Signed)
 " PROGRESS NOTE    Alexandria Harvey  FMW:[id number] DOB: 01/10/1981 DOA: 08/11/2024 PCP: [facility]   Brief Narrative:  43 year old female- Under guardianship of care for DSS-phone #[phone]--- apparently also has a caregiver [name]/[name] who is the group home, DM, mental retardation, Schizoaffective disorder, previous hallucination, sacral decubitus ulcer, cervical fracture with collar - p/w fever and encephalopathy.   Assessment & Plan:   Principal Problem:   Petechial rash Active Problems:   Sepsis (HCC)   Aortic valve mass   Multiple fractures of thoracic spine, closed (HCC)   Closed fracture of multiple cervical vertebrae (HCC)   Drug rash   Cognitive developmental delay   Lactic acid acidosis   Schizoaffective disorder (HCC)  Sepsis, unclear etiology Presented with leukocytosis, lactic acidosis, concern for sepsis.  She has been afebrile here so far off antibiotics since 12/6. Blood cultures done off antibiotics on 12/8 and 12/9 NGTD  Got empiric antibiotics initiated at presentation.   -Evaluated by ID, Antibiotics stopped.  Currently on line holiday.   Autoimmune workup unremarkable with negative ANA, c-ANCA, p-ANCA, RF, Sjogren syndrome antibody A and B negative, negative lupus anticoagulant  -transient temp of 100.57F on 12/14. CXR clear. Repeat blood neg. Pt remains afebrile. Remains stable    Acute metabolic encephalopathy, resolved Possible polypharmacy, dehydration, ? sepsis, in the setting of intellectual delay. Medications weaned back.  Mental status noted to return back to baseline. Delirium precautions.   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   Evaluated by infectious disease.  Only minimal concern for infectious etiology, other differentials include Libman-Sacks, myxoma, clot.   Autoimmune workup so far negative (negative ANA, RA, ANCA).  Negative lupus anticoagulant panel.   Follow-up blood cultures  closely.  Cardiology team favoring repeat TTE echo in 3 months after she has been on anticoagulation to evaluate need for TEE Continue eliquis  therapy.   Acute rash Unclear etiology, thought to be possible drug reaction versus excoriations from bedbugs.  Antibiotics stopped.   -stable now   Unstageable sacral ulcer (POA) Wound 06/11/24 Pressure Injury Sacrum Mid Unstageable - Full thickness tissue loss in which the base of the injury is covered by slough (yellow, tan, gray, green or brown) and/or eschar (tan, brown or black) in the wound bed. (Active)     Wound 08/16/24 0259 Pressure Injury Head Posterior Unstageable - Full thickness tissue loss in which the base of the injury is covered by slough (yellow, tan, gray, green or brown) and/or eschar (tan, brown or black) in the wound bed. (Active)    Was previously on vancomycin  prior to visit to ED.  Wound evaluated by wound care.  Wound care per their recommendations.   Diabetes mellitus type 2 Currently on Lantus  10 units and SSI, blood sugar controlled.   History of cervical fractures with polytrauma Pt had been in c-collar.  MRI C and T spine were noted to be reassuring. C-spine collar removed as per neurosurgeon, Dr Valeda recommendations mentioned in his note on 12/12.   Acute DVT BL LE Lower extremity Dopplers noting BL proximal femoral vein DVTs.  Continue Eliquis .  Unclear if provoked or unprovoked.  Hypercoagulable workup, so far negative.   Severe constipation Noted incidentally on CT scan.  Continue bowel regimen. Encourage out of bed to chair.   Cognitive deficits/schizophrenia Resides at a group home.  guardianship of care for DSS-phone #[phone]--- apparently also has a caregiver [name]/[name] who is the group home.  Patient evaluated by psychiatry.  Medication recommendations per psych.   Hyponatremia -Normalized -cont to encourage po intake  Disposition: Patient pending placement to SNF.  Please refer to [facility]  note.  DVT prophylaxis: SCDs Start: 08/12/24 9360   Code Status: Full Code  Family Communication: None present at bedside.   Status is: Inpatient Remains inpatient appropriate because: Medically stable, pending placement.   Estimated body mass index is 28.31 kg/m as calculated from the following:   Height as of this encounter: 5' 2 (1.575 m).   Weight as of this encounter: 70.2 kg.  Wound 06/11/24 Pressure Injury Sacrum Mid Unstageable - Full thickness tissue loss in which the base of the injury is covered by slough (yellow, tan, gray, green or brown) and/or eschar (tan, brown or black) in the wound bed. (Active)     Wound 06/11/24 Pressure Injury Ischial tuberosity Right Deep Tissue Pressure Injury - Purple or maroon localized area of discolored intact skin or blood-filled blister due to damage of underlying soft tissue from pressure and/or shear. (Active)     Wound 08/16/24 0259 Pressure Injury Head Posterior Stage 3 -  Full thickness tissue loss. Subcutaneous fat may be visible but bone, tendon or muscle are NOT exposed. (Active)   Nutritional Assessment: Body mass index is 28.31 kg/m.[name] Seen by dietician.  I agree with the assessment and plan as outlined below: Nutrition Status:        . Skin Assessment: I have examined the patient's skin and I agree with the wound assessment as performed by the wound care RN as outlined below: Wound 06/11/24 Pressure Injury Sacrum Mid Unstageable - Full thickness tissue loss in which the base of the injury is covered by slough (yellow, tan, gray, green or brown) and/or eschar (tan, brown or black) in the wound bed. (Active)     Wound 06/11/24 Pressure Injury Ischial tuberosity Right Deep Tissue Pressure Injury - Purple or maroon localized area of discolored intact skin or blood-filled blister due to damage of underlying soft tissue from pressure and/or shear. (Active)     Wound 08/16/24 0259 Pressure Injury Head Posterior Stage 3 -  Full  thickness tissue loss. Subcutaneous fat may be visible but bone, tendon or muscle are NOT exposed. (Active)    Consultants:  Neurosurgery, cardiology, psychiatry, infectious disease  Procedures:  None  Antimicrobials:  Anti-infectives (From admission, onward)    Start     Dose/Rate Route Frequency Ordered Stop   08/12/24 1700  linezolid  (ZYVOX ) IVPB 600 mg  Status:  Discontinued        600 mg 300 mL/hr over 60 Minutes Intravenous Every 12 hours 08/12/24 0645 08/12/24 0823   08/12/24 1700  metroNIDAZOLE  (FLAGYL ) IVPB 500 mg  Status:  Discontinued        500 mg 100 mL/hr over 60 Minutes Intravenous Every 12 hours 08/12/24 0645 08/12/24 0823   08/12/24 1200  ceFEPIme  (MAXIPIME ) 2 g in sodium chloride  0.9 % 100 mL IVPB  Status:  Discontinued        2 g 200 mL/hr over 30 Minutes Intravenous Every 8 hours 08/12/24 0648 08/12/24 0823   08/12/24 0245  linezolid  (ZYVOX ) IVPB 600 mg        600 mg 300 mL/hr over 60 Minutes Intravenous  Once 08/12/24 0233 08/12/24 0453   08/12/24 0230  ceFEPIme  (MAXIPIME ) 2 g in sodium chloride  0.9 % 100 mL IVPB        2 g 200 mL/hr over 30 Minutes Intravenous  Once  08/12/24 0221 08/12/24 0343   08/12/24 0230  metroNIDAZOLE  (FLAGYL ) IVPB 500 mg        500 mg 100 mL/hr over 60 Minutes Intravenous  Once 08/12/24 0221 08/12/24 0603         Subjective: Patient seen and examined.  Sleepy but easily arousable.  No new complaint other than generalized bodyaches, more so in the legs.  Objective: Vitals:   08/30/24 0441 08/30/24 0649 08/30/24 0750 08/30/24 1157  BP: 120/75  126/88 104/65  Pulse: (!) 117 (!) 112 (!) 109 97  Resp: 20     Temp: 98.6 F (37 C)  97.7 F (36.5 C) 98.5 F (36.9 C)  TempSrc: Oral  Oral Oral  SpO2: 98%  98% 94%  Weight:      Height:        Intake/Output Summary (Last 24 hours) at 08/30/2024 1211 Last data filed at 08/29/2024 2142 Gross per 24 hour  Intake 838 ml  Output --  Net 838 ml   Filed Weights   08/12/24 0600   Weight: 70.2 kg    Examination:  General exam: Appears calm and comfortable  Respiratory system: Clear to auscultation. Respiratory effort normal. Cardiovascular system: S1 & S2 heard, RRR. No JVD, murmurs, rubs, gallops or clicks. No pedal edema. Gastrointestinal system: Abdomen is nondistended, soft and nontender. No organomegaly or masses felt. Normal bowel sounds heard. Central nervous system: Alert and oriented. No focal neurological deficits.  Data Reviewed: I have personally reviewed following labs and imaging studies  CBC: Recent Labs  Lab 08/24/24 0220 08/25/24 0531 08/27/24 0151 08/27/24 1326  WBC 10.4 11.5* 12.4* 13.2*  NEUTROABS  --   --   --  8.1*  HGB 12.0 9.6* 11.1* 10.3*  HCT 38.1 30.1* 35.5* 33.4*  MCV 81.1 80.3 79.8* 81.3  PLT 347 370 399 343   Basic Metabolic Panel: Recent Labs  Lab 08/24/24 0220 08/25/24 0531 08/27/24 0151  NA 130* 135 134*  K 5.4* 4.2 4.2  CL 95* 100 99  CO2 19* 23 20*  GLUCOSE 190* 110* 175*  BUN 6 6 6   CREATININE 0.35* <0.30* 0.37*  CALCIUM  10.0 9.6 9.7   GFR: Estimated Creatinine Clearance: 84 mL/min (A) (by C-G formula based on SCr of 0.37 mg/dL (L)). Liver Function Tests: Recent Labs  Lab 08/24/24 0220 08/25/24 0531 08/27/24 0151  AST 20 18 18   ALT 10 11 7   ALKPHOS 92 72 88  BILITOT 0.3 <0.2 <0.2  PROT 8.4* 6.9 7.8  ALBUMIN 3.4* 3.2* 3.3*   No results for input(s): LIPASE, AMYLASE in the last 168 hours. No results for input(s): AMMONIA in the last 168 hours. Coagulation Profile: No results for input(s): INR, PROTIME in the last 168 hours. Cardiac Enzymes: No results for input(s): CKTOTAL, CKMB, CKMBINDEX, TROPONINI in the last 168 hours. BNP (last 3 results) No results for input(s): PROBNP in the last 8760 hours. HbA1C: No results for input(s): HGBA1C in the last 72 hours. CBG: Recent Labs  Lab 08/29/24 1153 08/29/24 1730 08/29/24 1957 08/30/24 0753 08/30/24 1156  GLUCAP 238*  123* 122* 104* 106*   Lipid Profile: No results for input(s): CHOL, HDL, LDLCALC, TRIG, CHOLHDL, LDLDIRECT in the last 72 hours. Thyroid  Function Tests: No results for input(s): TSH, T4TOTAL, FREET4, T3FREE, THYROIDAB in the last 72 hours. Anemia Panel: No results for input(s): VITAMINB12, FOLATE, FERRITIN, TIBC, IRON, RETICCTPCT in the last 72 hours. Sepsis Labs: No results for input(s): PROCALCITON, LATICACIDVEN in the last 168 hours.  No  results found for this or any previous visit (from the past 240 hours).    Radiology Studies: No results found.  Scheduled Meds:  apixaban   5 mg Oral BID   cloZAPine   50 mg Oral BID   collagenase    Topical Daily   diphenhydrAMINE -zinc  acetate   Topical BID   divalproex   250 mg Oral TID   gabapentin   100 mg Oral TID   insulin  aspart  0-15 Units Subcutaneous TID WC   insulin  aspart  0-5 Units Subcutaneous QHS   insulin  glargine  10 Units Subcutaneous Daily   lubiprostone   24 mcg Oral Q breakfast   metFORMIN   500 mg Oral BID WC   polyethylene glycol  17 g Oral BID   senna-docusate  1 tablet Oral Daily   Continuous Infusions:   LOS: 18 days   Fredia Skeeter, MD [facility]  08/30/2024, 12:11 PM   *Please note that this is a verbal dictation therefore any spelling or grammatical errors are due to the Dragon Medical One system interpretation.  Please page via Amion and do not message via secure chat for urgent patient care matters. Secure chat can be used for non urgent patient care matters.  How to contact the [facility] Attending or Consulting provider 7A - 7P or covering provider during after hours 7P -7A, for this patient?  Check the care team in [facility] and look for a) attending/consulting [facility] provider listed and b) the [facility] team listed. Page or secure chat 7A-7P. Log into [email or web address] and use [facility] universal password to access. If you do not have the password, please contact the hospital  operator. Locate the [facility] provider you are looking for under [facility] and page to a number that you can be directly reached. If you still have difficulty reaching the provider, please page the [facility] (Director on Call) for the Hospitalists listed on amion for assistance.  "

## 2024-08-31 DIAGNOSIS — R233 Spontaneous ecchymoses: Secondary | ICD-10-CM | POA: Diagnosis not present

## 2024-08-31 LAB — GLUCOSE, CAPILLARY
Glucose-Capillary: 80 mg/dL (ref 70–99)
Glucose-Capillary: 110 mg/dL — ABNORMAL HIGH (ref 70–99)
Glucose-Capillary: 140 mg/dL — ABNORMAL HIGH (ref 70–99)
Glucose-Capillary: 130 mg/dL — ABNORMAL HIGH (ref 70–99)

## 2024-08-31 NOTE — Plan of Care (Signed)
 " Problem: Education: Goal: Knowledge of General Education information will improve Description: Including pain rating scale, medication(s)/side effects and non-pharmacologic comfort measures 08/31/2024 0508 by Shona Juna GRADE, RN Outcome: Progressing 08/31/2024 0507 by Shona Juna GRADE, RN Outcome: Progressing   Problem: Health Behavior/Discharge Planning: Goal: Ability to manage health-related needs will improve 08/31/2024 0508 by Shona Juna GRADE, RN Outcome: Progressing 08/31/2024 0507 by Shona Juna GRADE, RN Outcome: Progressing   Problem: Clinical Measurements: Goal: Ability to maintain clinical measurements within normal limits will improve 08/31/2024 0508 by Shona Juna GRADE, RN Outcome: Progressing 08/31/2024 0507 by Shona Juna GRADE, RN Outcome: Progressing Goal: Will remain free from infection 08/31/2024 0508 by Shona Juna GRADE, RN Outcome: Progressing 08/31/2024 0507 by Shona Juna GRADE, RN Outcome: Progressing Goal: Diagnostic test results will improve 08/31/2024 0508 by Shona Juna GRADE, RN Outcome: Progressing 08/31/2024 0507 by Shona Juna GRADE, RN Outcome: Progressing Goal: Respiratory complications will improve 08/31/2024 0508 by Shona Juna GRADE, RN Outcome: Progressing 08/31/2024 0507 by Shona Juna GRADE, RN Outcome: Progressing Goal: Cardiovascular complication will be avoided 08/31/2024 0508 by Shona Juna GRADE, RN Outcome: Progressing 08/31/2024 0507 by Shona Juna GRADE, RN Outcome: Progressing   Problem: Activity: Goal: Risk for activity intolerance will decrease 08/31/2024 0508 by Shona Juna GRADE, RN Outcome: Progressing 08/31/2024 0507 by Shona Juna GRADE, RN Outcome: Progressing   Problem: Nutrition: Goal: Adequate nutrition will be maintained 08/31/2024 0508 by Shona Juna GRADE, RN Outcome: Progressing 08/31/2024 0507 by Shona Juna GRADE, RN Outcome: Progressing   Problem: Coping: Goal: Level of anxiety will decrease 08/31/2024  0508 by Shona Juna GRADE, RN Outcome: Progressing 08/31/2024 0507 by Shona Juna GRADE, RN Outcome: Progressing   Problem: Elimination: Goal: Will not experience complications related to bowel motility 08/31/2024 0508 by Shona Juna GRADE, RN Outcome: Progressing 08/31/2024 0507 by Shona Juna GRADE, RN Outcome: Progressing Goal: Will not experience complications related to urinary retention 08/31/2024 0508 by Shona Juna GRADE, RN Outcome: Progressing 08/31/2024 0507 by Shona Juna GRADE, RN Outcome: Progressing   Problem: Pain Managment: Goal: General experience of comfort will improve and/or be controlled 08/31/2024 0508 by Shona Juna GRADE, RN Outcome: Progressing 08/31/2024 0507 by Shona Juna GRADE, RN Outcome: Progressing   Problem: Safety: Goal: Ability to remain free from injury will improve 08/31/2024 0508 by Shona Juna GRADE, RN Outcome: Progressing 08/31/2024 0507 by Shona Juna GRADE, RN Outcome: Progressing   Problem: Skin Integrity: Goal: Risk for impaired skin integrity will decrease 08/31/2024 0508 by Shona Juna GRADE, RN Outcome: Progressing 08/31/2024 0507 by Shona Juna GRADE, RN Outcome: Progressing   Problem: Education: Goal: Ability to describe self-care measures that may prevent or decrease complications (Diabetes Survival Skills Education) will improve 08/31/2024 0508 by Shona Juna GRADE, RN Outcome: Progressing 08/31/2024 0507 by Shona Juna GRADE, RN Outcome: Progressing Goal: Individualized Educational Video(s) 08/31/2024 0508 by Shona Juna GRADE, RN Outcome: Progressing 08/31/2024 0507 by Shona Juna GRADE, RN Outcome: Progressing   Problem: Coping: Goal: Ability to adjust to condition or change in health will improve 08/31/2024 0508 by Shona Juna GRADE, RN Outcome: Progressing 08/31/2024 0507 by Shona Juna GRADE, RN Outcome: Progressing   Problem: Fluid Volume: Goal: Ability to maintain a balanced intake and output will improve 08/31/2024  0508 by Shona Juna GRADE, RN Outcome: Progressing 08/31/2024 0507 by Shona Juna GRADE, RN Outcome: Progressing   Problem: Health Behavior/Discharge Planning: Goal: Ability to identify and utilize available resources and services will improve 08/31/2024 0508 by Shona Juna GRADE, RN Outcome: Progressing 08/31/2024 0507  by Shona Juna GRADE, RN Outcome: Progressing Goal: Ability to manage health-related needs will improve 08/31/2024 0508 by Shona Juna GRADE, RN Outcome: Progressing 08/31/2024 0507 by Shona Juna GRADE, RN Outcome: Progressing   Problem: Metabolic: Goal: Ability to maintain appropriate glucose levels will improve 08/31/2024 0508 by Shona Juna GRADE, RN Outcome: Progressing 08/31/2024 0507 by Shona Juna GRADE, RN Outcome: Progressing   Problem: Nutritional: Goal: Maintenance of adequate nutrition will improve 08/31/2024 0508 by Shona Juna GRADE, RN Outcome: Progressing 08/31/2024 0507 by Shona Juna GRADE, RN Outcome: Progressing Goal: Progress toward achieving an optimal weight will improve 08/31/2024 0508 by Shona Juna GRADE, RN Outcome: Progressing 08/31/2024 0507 by Shona Juna GRADE, RN Outcome: Progressing   Problem: Skin Integrity: Goal: Risk for impaired skin integrity will decrease 08/31/2024 0508 by Shona Juna GRADE, RN Outcome: Progressing 08/31/2024 0507 by Shona Juna GRADE, RN Outcome: Progressing   Problem: Tissue Perfusion: Goal: Adequacy of tissue perfusion will improve 08/31/2024 0508 by Shona Juna GRADE, RN Outcome: Progressing 08/31/2024 0507 by Shona Juna GRADE, RN Outcome: Progressing   "

## 2024-08-31 NOTE — Plan of Care (Signed)

## 2024-08-31 NOTE — Progress Notes (Signed)
 " PROGRESS NOTE    JLYNN LY  FMW:[id number] DOB: 1981/05/15 DOA: 08/11/2024 PCP: [facility]   Brief Narrative:  43 year old female- Under guardianship of care for DSS-phone #[phone]--- apparently also has a caregiver [name]/[name] who is the group home, DM, mental retardation, Schizoaffective disorder, previous hallucination, sacral decubitus ulcer, cervical fracture with collar - p/w fever and encephalopathy.   Assessment & Plan:   Principal Problem:   Petechial rash Active Problems:   Sepsis (HCC)   Aortic valve mass   Multiple fractures of thoracic spine, closed (HCC)   Closed fracture of multiple cervical vertebrae (HCC)   Drug rash   Cognitive developmental delay   Lactic acid acidosis   Schizoaffective disorder (HCC)   Tetraplegia (HCC)   Neurogenic bowel   Neurogenic bladder   At risk for autonomic dysreflexia   Spasticity  Sepsis, unclear etiology Presented with leukocytosis, lactic acidosis, concern for sepsis.  She has been afebrile here so far off antibiotics since 12/6. Blood cultures done off antibiotics on 12/8 and 12/9 NGTD  Got empiric antibiotics initiated at presentation.   -Evaluated by ID, Antibiotics stopped.  Currently on line holiday.   Autoimmune workup unremarkable with negative ANA, c-ANCA, p-ANCA, RF, Sjogren syndrome antibody A and B negative, negative lupus anticoagulant  -transient temp of 100.107F on 12/14. CXR clear. Repeat blood neg. Pt remains afebrile. Remains stable    Acute metabolic encephalopathy, resolved Possible polypharmacy, dehydration, ? sepsis, in the setting of intellectual delay. Medications weaned back.  Mental status noted to return back to baseline. Delirium precautions.   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   Evaluated by infectious disease.  Only minimal concern for infectious etiology, other differentials include Libman-Sacks, myxoma, clot.   Autoimmune workup  so far negative (negative ANA, RA, ANCA).  Negative lupus anticoagulant panel.   Follow-up blood cultures closely.  Cardiology team favoring repeat TTE echo in 3 months after she has been on anticoagulation to evaluate need for TEE Continue eliquis  therapy.   Acute rash Unclear etiology, thought to be possible drug reaction versus excoriations from bedbugs.  Antibiotics stopped.   -stable now   Unstageable sacral ulcer (POA) Wound 06/11/24 Pressure Injury Sacrum Mid Unstageable - Full thickness tissue loss in which the base of the injury is covered by slough (yellow, tan, gray, green or brown) and/or eschar (tan, brown or black) in the wound bed. (Active)     Wound 08/16/24 0259 Pressure Injury Head Posterior Unstageable - Full thickness tissue loss in which the base of the injury is covered by slough (yellow, tan, gray, green or brown) and/or eschar (tan, brown or black) in the wound bed. (Active)    Was previously on vancomycin  prior to visit to ED.  Wound evaluated by wound care.  Wound care per their recommendations.   Diabetes mellitus type 2 Currently on Lantus  10 units and SSI, blood sugar controlled.   History of cervical fractures with polytrauma Pt had been in c-collar.  MRI C and T spine were noted to be reassuring. C-spine collar removed as per neurosurgeon, Dr Valeda recommendations mentioned in his note on 12/12.   Acute DVT BL LE Lower extremity Dopplers noting BL proximal femoral vein DVTs.  Continue Eliquis .  Unclear if provoked or unprovoked.  Hypercoagulable workup, so far negative.   Severe constipation Noted incidentally on CT scan.  Continue bowel regimen. Encourage out of bed to chair.   Cognitive deficits/schizophrenia Resides at  a group home.  guardianship of care for DSS-phone #[phone]--- apparently also has a caregiver [name]/[name] who is the group home.   Patient evaluated by psychiatry.  Medication recommendations per psych.    Hyponatremia -Normalized -cont to encourage po intake  Disposition: Patient pending placement to SNF.  Please refer to [facility] note.  DVT prophylaxis: SCDs Start: 08/12/24 9360   Code Status: Full Code  Family Communication: None present at bedside.   Status is: Inpatient Remains inpatient appropriate because: Medically stable, pending placement.   Estimated body mass index is 28.31 kg/m as calculated from the following:   Height as of this encounter: 5' 2 (1.575 m).   Weight as of this encounter: 70.2 kg.  Wound 06/11/24 Pressure Injury Sacrum Mid Unstageable - Full thickness tissue loss in which the base of the injury is covered by slough (yellow, tan, gray, green or brown) and/or eschar (tan, brown or black) in the wound bed. (Active)     Wound 08/16/24 0259 Pressure Injury Head Posterior Stage 3 -  Full thickness tissue loss. Subcutaneous fat may be visible but bone, tendon or muscle are NOT exposed. (Active)     Wound 08/29/24 1900 Pressure Injury Ischial tuberosity Right Stage 2 -  Partial thickness loss of dermis presenting as a shallow open injury with a red, pink wound bed without slough. (Active)   Nutritional Assessment: Body mass index is 28.31 kg/m.[name] Seen by dietician.  I agree with the assessment and plan as outlined below: Nutrition Status:        . Skin Assessment: I have examined the patient's skin and I agree with the wound assessment as performed by the wound care RN as outlined below: Wound 06/11/24 Pressure Injury Sacrum Mid Unstageable - Full thickness tissue loss in which the base of the injury is covered by slough (yellow, tan, gray, green or brown) and/or eschar (tan, brown or black) in the wound bed. (Active)     Wound 08/16/24 0259 Pressure Injury Head Posterior Stage 3 -  Full thickness tissue loss. Subcutaneous fat may be visible but bone, tendon or muscle are NOT exposed. (Active)     Wound 08/29/24 1900 Pressure Injury Ischial tuberosity Right  Stage 2 -  Partial thickness loss of dermis presenting as a shallow open injury with a red, pink wound bed without slough. (Active)    Consultants:  Neurosurgery, cardiology, psychiatry, infectious disease  Procedures:  None  Antimicrobials:  Anti-infectives (From admission, onward)    Start     Dose/Rate Route Frequency Ordered Stop   08/12/24 1700  linezolid  (ZYVOX ) IVPB 600 mg  Status:  Discontinued        600 mg 300 mL/hr over 60 Minutes Intravenous Every 12 hours 08/12/24 0645 08/12/24 0823   08/12/24 1700  metroNIDAZOLE  (FLAGYL ) IVPB 500 mg  Status:  Discontinued        500 mg 100 mL/hr over 60 Minutes Intravenous Every 12 hours 08/12/24 0645 08/12/24 0823   08/12/24 1200  ceFEPIme  (MAXIPIME ) 2 g in sodium chloride  0.9 % 100 mL IVPB  Status:  Discontinued        2 g 200 mL/hr over 30 Minutes Intravenous Every 8 hours 08/12/24 0648 08/12/24 0823   08/12/24 0245  linezolid  (ZYVOX ) IVPB 600 mg        600 mg 300 mL/hr over 60 Minutes Intravenous  Once 08/12/24 0233 08/12/24 0453   08/12/24 0230  ceFEPIme  (MAXIPIME ) 2 g in sodium chloride  0.9 % 100 mL IVPB  2 g 200 mL/hr over 30 Minutes Intravenous  Once 08/12/24 0221 08/12/24 0343   08/12/24 0230  metroNIDAZOLE  (FLAGYL ) IVPB 500 mg        500 mg 100 mL/hr over 60 Minutes Intravenous  Once 08/12/24 0221 08/12/24 0603         Subjective: Patient seen and examined.  I was able to meet her parents who are at the bedside today.  Patient was happy today.  She had no complaints.  She has stiffness.  PMR is managing there.  Objective: Vitals:   08/30/24 2352 08/31/24 0554 08/31/24 0804 08/31/24 1207  BP: 128/78 119/64 127/79 101/71  Pulse: (!) 106 96 95 (!) 108  Resp: 20 20 15    Temp: 98.4 F (36.9 C) 98.1 F (36.7 C) 98.2 F (36.8 C) 98.3 F (36.8 C)  TempSrc: Oral Oral Oral Oral  SpO2: 99% 98% 99% 96%  Weight:      Height:        Intake/Output Summary (Last 24 hours) at 08/31/2024 1427 Last data filed at  08/31/2024 0900 Gross per 24 hour  Intake 240 ml  Output --  Net 240 ml   Filed Weights   08/12/24 0600  Weight: 70.2 kg    Examination:  General exam: Appears calm and comfortable  Respiratory system: Clear to auscultation. Respiratory effort normal. Cardiovascular system: S1 & S2 heard, RRR. No JVD, murmurs, rubs, gallops or clicks. No pedal edema. Gastrointestinal system: Abdomen is nondistended, soft and nontender. No organomegaly or masses felt. Normal bowel sounds heard. Central nervous system: Alert and oriented. No focal neurological deficits.  Data Reviewed: I have personally reviewed following labs and imaging studies  CBC: Recent Labs  Lab 08/25/24 0531 08/27/24 0151 08/27/24 1326  WBC 11.5* 12.4* 13.2*  NEUTROABS  --   --  8.1*  HGB 9.6* 11.1* 10.3*  HCT 30.1* 35.5* 33.4*  MCV 80.3 79.8* 81.3  PLT 370 399 343   Basic Metabolic Panel: Recent Labs  Lab 08/25/24 0531 08/27/24 0151  NA 135 134*  K 4.2 4.2  CL 100 99  CO2 23 20*  GLUCOSE 110* 175*  BUN 6 6  CREATININE <0.30* 0.37*  CALCIUM  9.6 9.7   GFR: Estimated Creatinine Clearance: 84 mL/min (A) (by C-G formula based on SCr of 0.37 mg/dL (L)). Liver Function Tests: Recent Labs  Lab 08/25/24 0531 08/27/24 0151  AST 18 18  ALT 11 7  ALKPHOS 72 88  BILITOT <0.2 <0.2  PROT 6.9 7.8  ALBUMIN 3.2* 3.3*   No results for input(s): LIPASE, AMYLASE in the last 168 hours. No results for input(s): AMMONIA in the last 168 hours. Coagulation Profile: No results for input(s): INR, PROTIME in the last 168 hours. Cardiac Enzymes: No results for input(s): CKTOTAL, CKMB, CKMBINDEX, TROPONINI in the last 168 hours. BNP (last 3 results) No results for input(s): PROBNP in the last 8760 hours. HbA1C: Recent Labs    08/30/24 1147  HGBA1C 7.0*   CBG: Recent Labs  Lab 08/30/24 1156 08/30/24 1639 08/30/24 2001 08/31/24 0850 08/31/24 1208  GLUCAP 106* 145* 133* 80 110*   Lipid  Profile: No results for input(s): CHOL, HDL, LDLCALC, TRIG, CHOLHDL, LDLDIRECT in the last 72 hours. Thyroid  Function Tests: No results for input(s): TSH, T4TOTAL, FREET4, T3FREE, THYROIDAB in the last 72 hours. Anemia Panel: No results for input(s): VITAMINB12, FOLATE, FERRITIN, TIBC, IRON, RETICCTPCT in the last 72 hours. Sepsis Labs: No results for input(s): PROCALCITON, LATICACIDVEN in the last 168 hours.  No results found for this or any previous visit (from the past 240 hours).    Radiology Studies: No results found.  Scheduled Meds:  apixaban   5 mg Oral BID   cloZAPine   50 mg Oral BID   collagenase    Topical Daily   diphenhydrAMINE -zinc  acetate   Topical BID   divalproex   250 mg Oral TID   gabapentin   100 mg Oral TID   insulin  aspart  0-15 Units Subcutaneous TID WC   insulin  aspart  0-5 Units Subcutaneous QHS   insulin  glargine  10 Units Subcutaneous Daily   lubiprostone   24 mcg Oral Q breakfast   metFORMIN   500 mg Oral BID WC   polyethylene glycol  17 g Oral BID   senna-docusate  1 tablet Oral Daily   Continuous Infusions:   LOS: 19 days   Fredia Skeeter, MD [facility]  08/31/2024, 2:27 PM   *Please note that this is a verbal dictation therefore any spelling or grammatical errors are due to the Dragon Medical One system interpretation.  Please page via Amion and do not message via secure chat for urgent patient care matters. Secure chat can be used for non urgent patient care matters.  How to contact the [facility] Attending or Consulting provider 7A - 7P or covering provider during after hours 7P -7A, for this patient?  Check the care team in [facility] and look for a) attending/consulting [facility] provider listed and b) the [facility] team listed. Page or secure chat 7A-7P. Log into [email or web address] and use [place]'s universal password to access. If you do not have the password, please contact the hospital operator. Locate the [facility] provider  you are looking for under [facility] and page to a number that you can be directly reached. If you still have difficulty reaching the provider, please page the [facility] (Director on Call) for the Hospitalists listed on amion for assistance.  "

## 2024-09-01 ENCOUNTER — Encounter (HOSPITAL_COMMUNITY): Payer: Self-pay

## 2024-09-01 ENCOUNTER — Other Ambulatory Visit: Payer: Self-pay

## 2024-09-01 ENCOUNTER — Emergency Department (HOSPITAL_COMMUNITY)
Admission: EM | Admit: 2024-09-01 | Discharge: 2024-09-01 | Disposition: A | Discharge status: Home / Self Care | Attending: Emergency Medicine | Admitting: Emergency Medicine

## 2024-09-01 DIAGNOSIS — R233 Spontaneous ecchymoses: Secondary | ICD-10-CM | POA: Diagnosis not present

## 2024-09-01 DIAGNOSIS — Z760 Encounter for issue of repeat prescription: Secondary | ICD-10-CM | POA: Insufficient documentation

## 2024-09-01 DIAGNOSIS — Z7984 Long term (current) use of oral hypoglycemic drugs: Secondary | ICD-10-CM | POA: Insufficient documentation

## 2024-09-01 DIAGNOSIS — I10 Essential (primary) hypertension: Secondary | ICD-10-CM | POA: Insufficient documentation

## 2024-09-01 DIAGNOSIS — E119 Type 2 diabetes mellitus without complications: Secondary | ICD-10-CM | POA: Insufficient documentation

## 2024-09-01 DIAGNOSIS — Z79899 Other long term (current) drug therapy: Secondary | ICD-10-CM | POA: Diagnosis not present

## 2024-09-01 DIAGNOSIS — Z7901 Long term (current) use of anticoagulants: Secondary | ICD-10-CM | POA: Insufficient documentation

## 2024-09-01 LAB — GLUCOSE, CAPILLARY
Glucose-Capillary: 110 mg/dL — ABNORMAL HIGH (ref 70–99)
Glucose-Capillary: 155 mg/dL — ABNORMAL HIGH (ref 70–99)
Glucose-Capillary: 153 mg/dL — ABNORMAL HIGH (ref 70–99)

## 2024-09-01 MED ORDER — METFORMIN HCL ER 500 MG PO TB24: 500.0000 mg | ORAL_TABLET | Freq: Two times a day (BID) | ORAL | 0 refills | Status: AC

## 2024-09-01 MED ORDER — CLOZAPINE 50 MG PO TABS: 50.0000 mg | ORAL_TABLET | Freq: Two times a day (BID) | ORAL | 0 refills | Status: AC

## 2024-09-01 MED ORDER — GLIPIZIDE ER 2.5 MG PO TB24: 2.5000 mg | ORAL_TABLET | Freq: Every day | ORAL | 0 refills | Status: AC

## 2024-09-01 MED ORDER — OXYCODONE HCL 5 MG PO TABS: 2.5000 mg | ORAL_TABLET | ORAL | 0 refills | Status: AC | PRN

## 2024-09-01 MED ORDER — GABAPENTIN 100 MG PO CAPS: 100.0000 mg | ORAL_CAPSULE | Freq: Three times a day (TID) | ORAL | 0 refills | Status: AC

## 2024-09-01 MED ORDER — ATORVASTATIN CALCIUM 40 MG PO TABS: 40.0000 mg | ORAL_TABLET | Freq: Every day | ORAL | 11 refills | Status: AC

## 2024-09-01 MED ORDER — DIVALPROEX SODIUM 250 MG PO DR TAB: 250.0000 mg | DELAYED_RELEASE_TABLET | Freq: Three times a day (TID) | ORAL | 0 refills | Status: AC

## 2024-09-01 MED ORDER — BACLOFEN 5 MG PO TABS: 5.0000 mg | ORAL_TABLET | Freq: Three times a day (TID) | ORAL | 0 refills | Status: AC

## 2024-09-01 MED ORDER — OXYCODONE HCL 5 MG PO TABS: 2.5000 mg | ORAL_TABLET | ORAL | 0 refills | Status: DC | PRN

## 2024-09-01 NOTE — Progress Notes (Signed)
 If tolerated would consider bowel care for neurogenic bowel  Procedure   Insert suppository in the morning after breakfast of evening after dinner, wait 10-15 min   Check bowels first by gently inserting a well lubricated gloved finger into the rectum. If there is stool present, hook the stool with the gloved finger to remove it. Then place the lubricated, medicated suppository into the rectum against the wall of the bowel.   Digital rectal stimulation is a technique used to stimulate the bowel reflex to start a bowel movement. 1. Gently insert a well lubricated gloved finger 1-2 inches into rectum. 2. Rotate finger in a circular motion for 10-30 seconds keeping the finger in contact with the rectal wall to trigger the bowel reflex and muscle contractions. 3. Perform digital stimulation every 5-10 minutes (2-3 times if needed) and continue until there is no stool in your lower bowel.

## 2024-09-01 NOTE — Plan of Care (Signed)

## 2024-09-01 NOTE — Discharge Instructions (Addendum)
 Paper prescription for opiates printed. Please follow up with your primary doctor. Please refer to discharge instructions from hospitalist team earlier today.  For any changes or new concerns, please return to the ED.

## 2024-09-01 NOTE — Progress Notes (Signed)
 Occupational Therapy Treatment Patient Details Name: Alexandria Harvey MRN: [id number] DOB: September 19, 1980 Today's Date: 09/01/2024   History of present illness 43 y.o. female admitted 08/11/24 for petechial rash suspect adverse drug reaction to Zosyn . 12/6 (+) DVT in BLE. PMHx: intellectual disability, schizophrenia, dropped head syndrome, HTN, seizures, diabetes, hx of C3, C5, C7 fxs with central cord syndrome, hx of T4-T5 fxs, and L foot/ankle fx.   OT comments  Patient received in supine and agreeable to OT treatment. Patient tolerated AAROM to RUE with less pain and improvement with AROM at hand and elbow. Patient with increased tone and pain at LUE requiring increased time to perform. Patient will benefit from continued inpatient follow up therapy, <3 hours/day.  Acute OT to continue to follow to address established goals to facilitate DC to next venue of care.        If plan is discharge home, recommend the following:  Two people to help with walking and/or transfers;Two people to help with bathing/dressing/bathroom;Assistance with cooking/housework;Assistance with feeding;Direct supervision/assist for medications management;Direct supervision/assist for financial management;Assist for transportation;Help with stairs or ramp for entrance;Supervision due to cognitive status   Equipment Recommendations  Other (comment) (defer to next venue of care)    Recommendations for Other Services      Precautions / Restrictions Precautions Precautions: Cervical;Fall Recall of Precautions/Restrictions: Impaired Restrictions Weight Bearing Restrictions Per Provider Order: No Other Position/Activity Restrictions: No brace needed per Neurosurgery note on 12/12       Mobility Bed Mobility Overal bed mobility: Needs Assistance Bed Mobility: Rolling Rolling: Max assist         General bed mobility comments: roll to right to perform positioning in bed    Transfers                          Balance                                           ADL either performed or assessed with clinical judgement   ADL Overall ADL's : Needs assistance/impaired     Grooming: Wash/dry face;Moderate assistance Grooming Details (indicate cue type and reason): able to wipe mouth and chin and required assistance with forehead and eyes                                    Extremity/Trunk Assessment              Vision       Perception     Praxis     Communication Communication Communication: Impaired Factors Affecting Communication: Reduced clarity of speech   Cognition Arousal: Alert Behavior During Therapy: Anxious, Lability Cognition: History of cognitive impairments                               Following commands: Impaired Following commands impaired: Only follows one step commands consistently, Follows one step commands with increased time, Follows multi-step commands inconsistently      Cueing   Cueing Techniques: Verbal cues, Gestural cues, Tactile cues, Visual cues  Exercises Exercises: General Upper Extremity General Exercises - Upper Extremity Shoulder Flexion: AAROM, PROM, Supine, Strengthening, 10 reps, Both Shoulder ABduction: PROM, AAROM, Both, 10 reps, Supine Elbow Flexion: PROM,  AAROM, Both, 10 reps, Supine Elbow Extension: PROM, AAROM, Both, 10 reps, Supine Wrist Flexion: PROM, AAROM, Both, 10 reps, Supine Wrist Extension: PROM, AAROM, Both, 10 reps, Supine Digit Composite Flexion: PROM, AROM, Both, 10 reps, Supine Composite Extension: PROM, AAROM, Both, 10 reps, Supine    Shoulder Instructions       General Comments Patient states she is excited to discharge to SNF for rehab    Pertinent Vitals/ Pain       Pain Assessment Pain Assessment: Faces Faces Pain Scale: Hurts even more Pain Location: LUE with ROM Pain Descriptors / Indicators: Sharp, Aching, Crying, Grimacing, Guarding Pain  Intervention(s): Limited activity within patient's tolerance, Monitored during session, Repositioned  Home Living                                          Prior Functioning/Environment              Frequency  Min 1X/week        Progress Toward Goals  OT Goals(current goals can now be found in the care plan section)  Progress towards OT goals: Progressing toward goals     Plan      Co-evaluation                 AM-PAC OT 6 Clicks Daily Activity     Outcome Measure   Help from another person eating meals?: A Lot Help from another person taking care of personal grooming?: Total Help from another person toileting, which includes using toliet, bedpan, or urinal?: Total Help from another person bathing (including washing, rinsing, drying)?: Total Help from another person to put on and taking off regular upper body clothing?: Total Help from another person to put on and taking off regular lower body clothing?: Total 6 Click Score: 7    End of Session    OT Visit Diagnosis: Other abnormalities of gait and mobility (R26.89);Muscle weakness (generalized) (M62.81);Ataxia, unspecified (R27.0)   Activity Tolerance Patient tolerated treatment well   Patient Left in bed;with call bell/phone within reach   Nurse Communication Mobility status        Time: 8767-8750 OT Time Calculation (min): 17 min  Charges: OT General Charges $OT Visit: 1 Visit OT Treatments $Therapeutic Exercise: 8-22 mins  Dick Laine, OTA Acute Rehabilitation Services  Office [phone]   Jeb LITTIE Laine 09/01/2024, 2:18 PM

## 2024-09-01 NOTE — ED Provider Notes (Signed)
 "  EMERGENCY DEPARTMENT AT [facility] Provider Note   CSN: [id number] Arrival date & time: 09/01/24  2042     History  Chief Complaint  Patient presents with   Medication Refill    Alexandria Harvey is a 43 y.o. female with PMH as listed below who presents with need for med refill.   43 year old female- Under guardianship of care for DSS-phone #[phone]--- apparently also has a caregiver [name]/[name] who is the group home, DM, mental retardation, Schizoaffective disorder, previous hallucination, sacral decubitus ulcer, cervical fracture with collar - p/w fever and encephalopathy.  Patient remained in the hospital for 42 days   Patient was discharged from [facility] around 6 pm today, taken to [facility] nursing home. [facility] would not take patient without hard copies of prescriptions for pain medication. [facility] was told by [facility] to bring patient back to the hospital to get the paper prescription.   Patient has no complaints.   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 Authorizing Provider  acetaminophen  (TYLENOL ) 500 MG tablet Take 2 tablets (1,000 mg total) by mouth every 8 (eight) hours as needed. 05/29/24   Maczis, Michael M, PA-C  Amino Acids-Protein Hydrolys (FEEDING SUPPLEMENT, PRO-STAT SUGAR FREE 64,) LIQD Take 30 mLs by mouth in the morning and at bedtime.    [provider]  apixaban  (ELIQUIS ) 5 MG TABS tablet Take 5 mg by mouth 2 (two) times daily. For 3 months    [provider]  atorvastatin  (LIPITOR ) 40 MG tablet Take 1 tablet (40 mg total) by  mouth daily. 09/01/24 09/01/25  Vernon Ranks, MD  Baclofen  5 MG TABS Take 1 tablet (5 mg total) by mouth 3 (three) times daily. 09/01/24 10/01/24  Vernon Ranks, MD  calcium -vitamin D  (OSCAL WITH D) 500-5 MG-MCG tablet Take 1 tablet by mouth 2 (two) times daily.    [provider]  cloZAPine  (CLOZARIL ) 50 MG tablet Take 1 tablet (50 mg total) by mouth 2 (two) times daily. 09/01/24 10/01/24  Vernon Ranks, MD  collagenase  (SANTYL ) 250 UNIT/GM ointment Apply topically daily. Patient not taking: Reported on 08/12/2024 06/15/24   Amilibia, Jaden, DO  divalproex  (DEPAKOTE ) 250 MG DR tablet Take 1 tablet (250 mg total) by mouth 3 (three) times daily. 09/01/24 10/01/24  Vernon Ranks, MD  ezetimibe  (ZETIA ) 10 MG tablet Take 10 mg by mouth daily. 05/09/24   [provider]  gabapentin  (NEURONTIN ) 100 MG capsule Take 1 capsule (100 mg total) by mouth 3 (three) times daily. 09/01/24 10/01/24  Vernon Ranks, MD  glipiZIDE  (GLUCOTROL  XL) 2.5 MG 24 hr tablet Take 1 tablet (2.5 mg total) by mouth daily with breakfast. 09/01/24 10/01/24  Vernon Ranks, MD  lubiprostone  (AMITIZA ) 24 MCG capsule Take 1 capsule (24 mcg total) by mouth daily with breakfast. 05/30/24   Maczis, Michael M, PA-C  metFORMIN  (GLUCOPHAGE -XR) 500 MG 24 hr tablet Take 1 tablet (500 mg total) by mouth 2 (two) times daily with a meal. 09/01/24 10/01/24  Vernon Ranks, MD  oxyCODONE  (OXY IR/ROXICODONE )  5 MG immediate release tablet Take 0.5 tablets (2.5 mg total) by mouth every 4 (four) hours as needed for moderate pain (pain score 4-6) or severe pain (pain score 7-10). 09/01/24   Franklyn Sid SAILOR, MD  polyethylene glycol (MIRALAX  / GLYCOLAX ) packet Take 17 g by mouth 2 (two) times daily. 06/29/17   Armbruster, Elspeth SQUIBB, MD  sennosides-docusate sodium  (SENOKOT-S) 8.6-50 MG tablet Take 1 tablet by mouth daily.    [provider]  simethicone  (MYLICON) 80 MG chewable tablet Chew 80 mg by mouth 2 (two) times daily.    [provider]  Vitamin D , Ergocalciferol , (DRISDOL ) 1.25 MG (50000 UNIT) CAPS capsule Take 1 capsule (50,000 Units total) by mouth once a week. Patient taking differently: Take 50,000 Units by mouth once a week. Thursdays 06/03/24   Maczis, Michael M, PA-C  zinc  oxide (BALMEX) 11.3 % CREA cream Apply 1 Application topically as needed.    [provider]      Allergies    Zosyn  [piperacillin -tazobactam in dex], Haloperidol lactate, Shrimp [shellfish allergy], and Strawberry (diagnostic)    Review of Systems   Review of Systems A 10 point review of systems was performed and is negative unless otherwise reported in HPI.  Physical Exam Updated Vital Signs BP (!) 148/95 (BP Location: Right Arm)   Pulse (!) 104   Temp 98.2 F (36.8 C)   Resp 18   Ht 5' 2 (1.575 m)   Wt 69.9 kg   SpO2 100%   BMI 28.17 kg/m  Physical Exam General: Chronically ill-appearing female lying in bed HEENT: Large scar on forehead Cardiology: RRR, no murmurs/rubs/gallops.  Resp: Normal respiratory rate and effort. CTAB, no wheezes, rhonchi, crackles.  GU: Deferred. MSK: No peripheral edema or signs of trauma.  Skin: warm, dry.  Neuro: Awake alert, no acute distress. Deconditioning. Contractures/spasticity present. Psych: Normal mood and affect.   ED Results / Procedures / Treatments   Labs (all labs ordered are listed, but only abnormal results are displayed) Labs Reviewed - No data to display  EKG None  Radiology No results found.  Procedures Procedures    Medications Ordered in ED Medications - No data to display  ED Course/ Medical Decision Making/ A&P                          Medical Decision Making  MDM:    Encounter entirely for paper prescription of opiates per SNF. EMS had even offered to drive back to hospital to pick up prescription but SNF wouldn't accept patient and made them come back here.  Patient has no complaints. Was DC'd 3 hours ago.  Paper prescription  printed for oxycodone  as was prescribed earlier this afternoon by hospitalist team.  No resp distress, protecting airway, patient denies pain or any complaints, she talks about food and a PS4.   Will DC back to facility via [facility]     Additional history obtained from chart review, EMS.     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    Dispense:  30 tablet    Refill:  0    I have reviewed the patients home medicines and have made adjustments as needed  Problem List / ED Course: Problem List Items Addressed This Visit   None Visit Diagnoses       Encounter for medication refill    -  Primary                   This note was created using dictation software, which may contain spelling or grammatical errors.    Franklyn Sid SAILOR, MD 09/01/24 2124  "

## 2024-09-01 NOTE — ED Triage Notes (Signed)
 Pt was discharged from [facility] around 6 pm, taken to [facility] nursing home. [facility] would not take patient without hardcopy prescriptions for pain medication. [facility] was told by [facility] to bring patient back to the hospital to get the paper prescription.

## 2024-09-01 NOTE — Discharge Summary (Signed)
 Physician Discharge Summary  Alexandria Harvey FMW:[id number] DOB: 1980/09/26 DOA: 08/11/2024  PCP: [facility]  Admit date: 08/11/2024 Discharge date: 09/01/2024 30 Day Unplanned Readmission Risk Score    Flowsheet Row ED to Hosp-Admission (Current) from 08/11/2024 in [place] [facility]  30 Day Unplanned Readmission Risk Score (%) 23.33 Filed at 09/01/2024 0801    This score is the patient's risk of an unplanned readmission within 30 days of being discharged (0 -100%). The score is based on dignosis, age, lab data, medications, orders, and past utilization.   Low:  0-14.9   Medium: 15-21.9   High: 22-29.9   Extreme: 30 and above          Admitted From: Group Home Disposition: SNF  Recommendations for Outpatient Follow-up:  Follow up with PCP in 1-2 weeks Please obtain BMP/CBC in one week Please follow up with your PCP on the following pending results: Unresulted Labs (From admission, onward)    None         Home Health: None Equipment/Devices: None none  Discharge Condition: Stable CODE STATUS: Full code Diet recommendation:  Diet Order             Diet regular Room service appropriate? Yes; Fluid consistency: Thin  Diet effective now                   Subjective: Seen and examined.  No new complaint other than generalized body ache due to stiffness and overall debility and deconditioning.  Brief/Interim Summary: 43 year old female- Under guardianship of care for Alexandria Harvey-phone #[phone]--- apparently also has a caregiver Alexandria Harvey/Alexandria Harvey who is the group home, DM, mental retardation, Schizoaffective disorder, previous hallucination, sacral decubitus ulcer, cervical fracture with collar - p/w fever and encephalopathy.  Patient remained in the hospital for 42 days.  Details of hospitalization below.  Sepsis, unclear etiology Presented with leukocytosis, lactic acidosis, concern for sepsis.  She has been afebrile here so far  off antibiotics since 12/6. Blood cultures done off antibiotics on 12/8 and 12/9 NGTD  Got empiric antibiotics initiated at presentation.   -Evaluated by ID, Antibiotics stopped.  Autoimmune workup unremarkable with negative ANA, c-ANCA, p-ANCA, RF, Sjogren syndrome antibody A and B negative, negative lupus anticoagulant  -transient temp of 100.37F on 12/14. CXR clear. Repeat blood neg. Pt remains afebrile. Remains stable    Acute metabolic encephalopathy, resolved Possible polypharmacy, dehydration, ? sepsis, in the setting of intellectual delay. Medications weaned back.  Mental status noted to return back to baseline since last several days.   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   Evaluated by infectious disease.  Only minimal concern for infectious etiology, other differentials include Libman-Sacks, myxoma, clot.   Autoimmune workup so far negative (negative ANA, RA, ANCA).  Negative lupus anticoagulant panel.  Cardiology team favored repeat TTE echo in 3 months after she has been on anticoagulation to evaluate need for TEE Continue eliquis  therapy.   Acute rash Unclear etiology, thought to be possible drug reaction versus excoriations from bedbugs.  Antibiotics stopped.   -stable now   Unstageable sacral ulcer (POA) Wound 06/11/24 Pressure Injury Sacrum Mid Unstageable - Full thickness tissue loss in which the base of the injury is covered by slough (yellow, tan, gray, green or brown) and/or eschar (tan, brown or black) in the wound bed. (Active)     Wound 08/16/24 0259 Pressure Injury Head Posterior Unstageable - Full thickness tissue loss in which  the base of the injury is covered by slough (yellow, tan, gray, green or brown) and/or eschar (tan, brown or black) in the wound bed. (Active)    Was previously on vancomycin  prior to visit to ED.  Wound evaluated by wound care.  Wound care per their recommendations.   Diabetes mellitus type 2 Currently on Lantus  10 units  and SSI and metformin , blood sugar controlled.  Discharging on metformin  500 p.o. twice daily and glipizide  ER 2.5 mg.  Since she was on low dose of Lantus , adding another glipizide  to the oral regimen hopefully will help enough without requiring insulin .   History of cervical fractures with polytrauma Pt was placed in c-collar in the beginning of the hospitalization. MRI C and T spine were noted to be reassuring. C-spine collar removed as per neurosurgeon, Dr Valeda recommendations mentioned in his note on 12/12.   Acute DVT BL LE Lower extremity Dopplers noting BL proximal femoral vein DVTs.  Continue Eliquis .  Unclear if provoked or unprovoked.  Hypercoagulable workup, so far negative.   Severe constipation Patient now has incontinence.  Patient was seen by PM&R team.  They have recommended bowel care as below.  I have copied exactly the recommendations below.  They also recommended follow-up with urology which I have ordered.    If tolerated would consider bowel care for neurogenic bowel  Procedure    Insert suppository in the morning after breakfast of evening after dinner, wait 10-15 min   Check bowels first by gently inserting a well lubricated gloved finger into the rectum. If there is stool present, hook the stool with the gloved finger to remove it. Then place the lubricated, medicated suppository into the rectum against the wall of the bowel.    Digital rectal stimulation is a technique used to stimulate the bowel reflex to start a bowel movement. 1. Gently insert a well lubricated gloved finger 1-2 inches into rectum. 2. Rotate finger in a circular motion for 10-30 seconds keeping the finger in contact with the rectal wall to trigger the bowel reflex and muscle contractions. 3. Perform digital stimulation every 5-10 minutes (2-3 times if needed) and continue until there is no stool in your lower bowel.     Cognitive deficits/schizophrenia Resides at a group home.   guardianship of care for Alexandria Harvey-phone #[phone]--- apparently also has a caregiver Alexandria Harvey/Alexandria Harvey who is the group home.   Patient evaluated by psychiatry.  Medication recommendations per psych.  Several medications were changed as below.   Hyponatremia -Normalized -cont to encourage po intake   Disposition: Patient being discharged to SNF.  Discharge plan was discussed with parents yesterday. Discharge Diagnoses:  Principal Problem:   Petechial rash Active Problems:   Sepsis (HCC)   Aortic valve mass   Multiple fractures of thoracic spine, closed (HCC)   Closed fracture of multiple cervical vertebrae (HCC)   Drug rash   Cognitive developmental delay   Lactic acid acidosis   Schizoaffective disorder (HCC)   Tetraplegia (HCC)   Neurogenic bowel   Neurogenic bladder   At risk for autonomic dysreflexia   Spasticity    Discharge Instructions   Allergies as of 09/01/2024       Reactions   Zosyn  [piperacillin -tazobactam In Dex] Rash   Petechial rash noted, ID was consulted   Haloperidol Lactate Other (See Comments)   Unknown reaction   Shrimp [shellfish Allergy] Hives, Rash   All Shellfish per [facility]   Strawberry (diagnostic) Rash  Medication List     STOP taking these medications    atenolol  50 MG tablet Commonly known as: TENORMIN    benztropine  1 MG tablet Commonly known as: COGENTIN    busPIRone  5 MG tablet Commonly known as: BUSPAR    methocarbamol  500 MG tablet Commonly known as: ROBAXIN    naloxone  0.4 MG/ML injection Commonly known as: NARCAN    omeprazole 20 MG capsule Commonly known as: PRILOSEC   predniSONE 20 MG tablet Commonly known as: DELTASONE   Zosyn  3-0.375 GM/50ML IVPB Generic drug: piperacillin -tazobactam       TAKE these medications    acetaminophen  500 MG tablet Commonly known as: TYLENOL  Take 2 tablets (1,000 mg total) by mouth every 8 (eight) hours as needed.   atorvastatin  40 MG tablet Commonly known as:  Lipitor  Take 1 tablet (40 mg total) by mouth daily. What changed:  medication strength how much to take when to take this   Baclofen  5 MG Tabs Take 1 tablet (5 mg total) by mouth 3 (three) times daily.   calcium -vitamin D  500-5 MG-MCG tablet Commonly known as: OSCAL WITH D Take 1 tablet by mouth 2 (two) times daily.   clozapine  50 MG tablet Commonly known as: CLOZARIL  Take 1 tablet (50 mg total) by mouth 2 (two) times daily. What changed:  medication strength how much to take   collagenase  250 UNIT/GM ointment Commonly known as: SANTYL  Apply topically daily.   divalproex  250 MG DR tablet Commonly known as: DEPAKOTE  Take 1 tablet (250 mg total) by mouth 3 (three) times daily. What changed: how much to take   Eliquis  5 MG Tabs tablet Generic drug: apixaban  Take 5 mg by mouth 2 (two) times daily. For 3 months   ezetimibe  10 MG tablet Commonly known as: ZETIA  Take 10 mg by mouth daily.   feeding supplement (PRO-STAT SUGAR FREE 64) Liqd Take 30 mLs by mouth in the morning and at bedtime.   gabapentin  100 MG capsule Commonly known as: NEURONTIN  Take 1 capsule (100 mg total) by mouth 3 (three) times daily. What changed:  medication strength how much to take   glipiZIDE  2.5 MG 24 hr tablet Commonly known as: GLUCOTROL  XL Take 1 tablet (2.5 mg total) by mouth daily with breakfast.   lubiprostone  24 MCG capsule Commonly known as: AMITIZA  Take 1 capsule (24 mcg total) by mouth daily with breakfast.   metFORMIN  500 MG 24 hr tablet Commonly known as: GLUCOPHAGE -XR Take 1 tablet (500 mg total) by mouth 2 (two) times daily with a meal.   oxyCODONE  5 MG immediate release tablet Commonly known as: Oxy IR/ROXICODONE  Take 0.5 tablets (2.5 mg total) by mouth every 4 (four) hours as needed for moderate pain (pain score 4-6) or severe pain (pain score 7-10). What changed:  how much to take when to take this reasons to take this   polyethylene glycol 17 g packet Commonly  known as: MIRALAX  / GLYCOLAX  Take 17 g by mouth 2 (two) times daily.   sennosides-docusate sodium  8.6-50 MG tablet Commonly known as: SENOKOT-S Take 1 tablet by mouth daily.   simethicone  80 MG chewable tablet Commonly known as: MYLICON Chew 80 mg by mouth 2 (two) times daily.   Vitamin D  (Ergocalciferol ) 1.25 MG (50000 UNIT) Caps capsule Commonly known as: DRISDOL  Take 1 capsule (50,000 Units total) by mouth once a week. What changed: additional instructions   zinc  oxide 11.3 % Crea cream Commonly known as: BALMEX Apply 1 Application topically as needed.  Contact information for follow-up providers     [facility] Follow up in 1 week(s).   Specialty: Family Medicine Contact information: [address] [place] [address] [phone]         [facility] Follow up in 1 week(s).   Contact information: [address] [place] [address] [place]  [address] [phone]             Contact information for after-discharge care     Destination     [facility] .   Service: Skilled Nursing Contact information: [address] [place]  [address] [phone]                    Allergies[1]  Consultations: PMR, neurosurgery, ID, cardiology, psychiatry   Procedures/Studies: DG CHEST PORT 1 VIEW Result Date: 08/20/2024 CLINICAL DATA:  Fever. EXAM: PORTABLE CHEST 1 VIEW COMPARISON:  08/12/2024 FINDINGS: Stable asymmetric elevation right hemidiaphragm. The cardio pericardial silhouette is enlarged. There is pulmonary vascular congestion without overt pulmonary edema. Left PICC line seen previously has been removed in the interval. IMPRESSION: Enlargement of the cardiopericardial silhouette with pulmonary vascular congestion. Electronically Signed   By: Camellia Candle M.D.   On: 08/20/2024 09:06   MR CERVICAL SPINE WO CONTRAST Result  Date: 08/18/2024 EXAM: MRI CERVICAL SPINE WITHOUT CONTRAST 08/17/2024 07:16:52 PM TECHNIQUE: Multiplanar multisequence MRI of the cervical spine was performed. COMPARISON: MRI CERVICAL AND THORACIC SPINE WITHOUT CONTRAST 05/23/2024. CLINICAL HISTORY: Neck trauma, ligament injury suspected (Age >= 16y). FINDINGS: Motion limited study, particularly on the axial sequences and sagittal STIR sequence. Within this limitation: BONES AND ALIGNMENT: Normal alignment. Known cervical spine fractures characterized on prior CT of the cervical spine not well evaluated on this study. No new marrow edema to suggest interval fracture. SPINAL CORD: More discrete T2 hyperintensity at C3-C4 with cord volume loss, compatible with myelomalacia. SOFT TISSUES: Persistent, but improved edema in the interspinous ligaments at C3-C4 to C6-C7, compatible with strain. DISC LEVELS: Mild to moderate canal stenosis at C5-C6. Mild canal stenosis at C4-C5 and C6-C7. Nondiagnostic evaluation for foraminal stenosis due to motion on the axial sequences. IMPRESSION: 1. More discrete T2 hyperintensity at C3-C4 with cord volume loss, compatible with myelomalacia. 2. Persistent, but improved edema in the interspinous ligaments at C3-C4 to C6-C7, compatible with injury/strain. 3. Known cervical spine fractures characterized on prior CT of the cervical spine not well evaluated on this study. Normal alignment. 4. Mild to moderate canal stenosis at C5-C6. Mild canal stenosis at C4-C5 and C6-C7. 5. Nondiagnostic evaluation for foraminal stenosis due to motion on the axial sequences. Electronically signed by: Gilmore Molt 08/18/2024 03:35 AM EST RP Workstation: [id number]   MR THORACIC SPINE WO CONTRAST Result Date: 08/18/2024 EXAM: MRI THORACIC SPINE WITHOUT INTRAVENOUS CONTRAST 08/17/2024 07:15:12 PM TECHNIQUE: Multiplanar multisequence MRI of the thoracic spine was performed without the administration of intravenous contrast. COMPARISON: MRI CERVICAL  AND THORACIC SPINE WITHOUT CONTRAST 05/23/2024. CLINICAL HISTORY: Back trauma, abnormal neuro exam, CT or xray positive (Age >= 16y). FINDINGS: Moderately motion limited study.  Within this limitation: BONES AND ALIGNMENT: Healing T4, T5 and T6 fractures with decreased marrow edema. Similar extension into the posterior elements at T4. No change in alignment. SPINAL CORD: No obvious cord signal abnormality on motion limited assessment. SOFT TISSUES: Unremarkable.  No paraspinal edema. DEGENERATIVE CHANGES: Motion limits assessment but the bony retropulsion at T4-T5 appears similar to prior with mild mass effect  on the cord. No high grade foraminal stenosis. Mild canal stenosis at multiple levels. IMPRESSION: 1. Healing T4, T5 and T6 fractures with decreased marrow edema. Similar extension into the posterior elements at T4. 2. Similar alignment. Bony retropulsion at T4-T5 appears similar to prior with mild mass effect on the cord. 3. Moderately motion limited study. Electronically signed by: Gilmore Molt 08/18/2024 03:15 AM EST RP Workstation: [id number]   VAS US  UPPER EXTREMITY VENOUS DUPLEX Result Date: 08/12/2024 UPPER VENOUS STUDY  Patient Name:  Alexandria Harvey  Date of Exam:   08/12/2024 Medical Rec #: [id number]         Accession #:    [id number] Date of Birth: June 03, 1981        Patient Gender: F Patient Age:   79 years Exam Location:  [facility] Procedure:      VAS US  UPPER EXTREMITY VENOUS DUPLEX Referring Phys: COLEN GRIMES --------------------------------------------------------------------------------  Indications: Edema, and recent fall. Limitations: Musculoskeletal features and patient contorted and C-collar. Comparison Study: No prior exam. Performing Technologist: Edilia Elden Appl  Examination Guidelines: A complete evaluation includes B-mode imaging, spectral Doppler, color Doppler, and power Doppler as needed of all accessible portions of each vessel. Bilateral testing is  considered an integral part of a complete examination. Limited examinations for reoccurring indications may be performed as noted.  Right Findings: +----------+------------+---------+-----------+----------+--------------+ RIGHT     CompressiblePhasicitySpontaneousProperties   Summary     +----------+------------+---------+-----------+----------+--------------+ IJV                                                 Not visualized +----------+------------+---------+-----------+----------+--------------+ Subclavian               Yes       Yes                             +----------+------------+---------+-----------+----------+--------------+ Axillary      Full       Yes       Yes                             +----------+------------+---------+-----------+----------+--------------+ Brachial      Full       Yes       Yes                             +----------+------------+---------+-----------+----------+--------------+ Radial        Full                                                 +----------+------------+---------+-----------+----------+--------------+ Ulnar         Full                                                 +----------+------------+---------+-----------+----------+--------------+ Cephalic      Full       Yes       Yes                             +----------+------------+---------+-----------+----------+--------------+  Basilic       Full       Yes       Yes                             +----------+------------+---------+-----------+----------+--------------+ Right internal jugular vein not visualized due to C-collar. Limited examination due to patient being contorted and not being able to properly position. Best obtainable images.  Left Findings: +----------+------------+---------+-----------+----------+--------------+ LEFT      CompressiblePhasicitySpontaneousProperties   Summary      +----------+------------+---------+-----------+----------+--------------+ IJV                                                 Not visualized +----------+------------+---------+-----------+----------+--------------+ Subclavian               Yes       Yes                             +----------+------------+---------+-----------+----------+--------------+ Axillary      Full       Yes       Yes                             +----------+------------+---------+-----------+----------+--------------+ Brachial      Full                                                 +----------+------------+---------+-----------+----------+--------------+ Radial        Full                                                 +----------+------------+---------+-----------+----------+--------------+ Ulnar                                               Not visualized +----------+------------+---------+-----------+----------+--------------+ Cephalic      Full       Yes       Yes                             +----------+------------+---------+-----------+----------+--------------+ Basilic       Full       Yes       Yes                             +----------+------------+---------+-----------+----------+--------------+ Left internal jugular vein not visualized due to C-collar and left ulnar vein unaccessible. Limited examination due to patient being contorted and not being able to properly position. Best obtainable images.  Summary:  Right: No evidence of deep vein thrombosis in the upper extremity. No evidence of superficial vein thrombosis in the upper extremity.  Left: No evidence of deep vein thrombosis in the upper extremity. No evidence of superficial vein thrombosis in the upper extremity.  *See table(s) above for measurements and  observations.  Diagnosing physician: Debby Robertson Electronically signed by Debby Robertson on 08/12/2024 at 8:12:40 PM.    Final    VAS US  LOWER EXTREMITY VENOUS  (DVT) Result Date: 08/12/2024  Lower Venous DVT Study Patient Name:  JAKAI ONOFRE  Date of Exam:   08/12/2024 Medical Rec #: [id number]         Accession #:    [id number] Date of Birth: 1981/01/06        Patient Gender: F Patient Age:   74 years Exam Location:  [facility] Procedure:      VAS US  LOWER EXTREMITY VENOUS (DVT) Referring Phys: COLEN GRIMES --------------------------------------------------------------------------------  Indications: Swelling, Edema, and recent fall.  Comparison Study: No prior exam. Performing Technologist: Edilia Elden Appl  Examination Guidelines: A complete evaluation includes B-mode imaging, spectral Doppler, color Doppler, and power Doppler as needed of all accessible portions of each vessel. Bilateral testing is considered an integral part of a complete examination. Limited examinations for reoccurring indications may be performed as noted. The reflux portion of the exam is performed with the patient in reverse Trendelenburg.  +---------+---------------+---------+-----------+----------+-------------------+ RIGHT    CompressibilityPhasicitySpontaneityPropertiesThrombus Aging      +---------+---------------+---------+-----------+----------+-------------------+ CFV      Partial        Yes      Yes                                      +---------+---------------+---------+-----------+----------+-------------------+ SFJ      Full           Yes      Yes                                      +---------+---------------+---------+-----------+----------+-------------------+ FV Prox  Partial                                                          +---------+---------------+---------+-----------+----------+-------------------+ FV Mid   None           Yes      Yes                  Not well visualized +---------+---------------+---------+-----------+----------+-------------------+ FV DistalFull           Yes      Yes                                       +---------+---------------+---------+-----------+----------+-------------------+ PFV      Partial        Yes      Yes                                      +---------+---------------+---------+-----------+----------+-------------------+ POP      Full           Yes      Yes                                      +---------+---------------+---------+-----------+----------+-------------------+  PTV                                                   Not well                                                                  visualized, patent                                                        by color.           +---------+---------------+---------+-----------+----------+-------------------+ PERO                                                  Not well                                                                  visualized, patent                                                        by color.           +---------+---------------+---------+-----------+----------+-------------------+ EIV                     Yes      Yes                                      +---------+---------------+---------+-----------+----------+-------------------+ Deep vein thrombosis noted in the right common femoral the proximal and middle femoral veins and the profunda vein.  +---------+---------------+---------+-----------+----------+-------------------+ LEFT     CompressibilityPhasicitySpontaneityPropertiesThrombus Aging      +---------+---------------+---------+-----------+----------+-------------------+ CFV      Partial        Yes      Yes                                      +---------+---------------+---------+-----------+----------+-------------------+ SFJ      Full           Yes      Yes                                      +---------+---------------+---------+-----------+----------+-------------------+ FV Prox  None  No        No                                       +---------+---------------+---------+-----------+----------+-------------------+ FV Mid   None           No       No                                       +---------+---------------+---------+-----------+----------+-------------------+ FV DistalFull           Yes      Yes                                      +---------+---------------+---------+-----------+----------+-------------------+ PFV      Partial        Yes      Yes                                      +---------+---------------+---------+-----------+----------+-------------------+ POP      Full           Yes      Yes                                      +---------+---------------+---------+-----------+----------+-------------------+ PTV                                                   Not well                                                                  visualized, patent                                                        by color.           +---------+---------------+---------+-----------+----------+-------------------+ PERO                                                  Not well                                                                  visualized, patent  by color.           +---------+---------------+---------+-----------+----------+-------------------+ EIV                     Yes      Yes                                      +---------+---------------+---------+-----------+----------+-------------------+ Deep vein thrombosis noted in the left common femoral the proximal and middle femoral veins and the profunda vein.    Summary: RIGHT: - Findings consistent with acute deep vein thrombosis involving the right common femoral vein, right femoral vein, and right proximal profunda vein.  - No cystic structure found in the popliteal fossa.  LEFT: - Findings  consistent with acute deep vein thrombosis involving the left common femoral vein, left femoral vein, and left proximal profunda vein.  - No cystic structure found in the popliteal fossa.  *See table(s) above for measurements and observations. Electronically signed by Debby Robertson on 08/12/2024 at 8:12:15 PM.    Final    ECHOCARDIOGRAM COMPLETE Result Date: 08/12/2024    ECHOCARDIOGRAM REPORT   Patient Name:   Alexandria Harvey Date of Exam: 08/12/2024 Medical Rec #:  [id number]        Height:       62.0 in Accession #:    [id number]       Weight:       154.8 lb Date of Birth:  1981-08-31       BSA:          1.714 m Patient Age:    43 years         BP:           117/77 mmHg Patient Gender: F                HR:           78 bpm. Exam Location:  Inpatient Procedure: 2D Echo, Color Doppler, Cardiac Doppler and Intracardiac            Opacification Agent (Both Spectral and Color Flow Doppler were            utilized during procedure). STAT ECHO Indications:    Endocarditis  History:        Patient has no prior history of Echocardiogram examinations.                 Risk Factors:Hypertension, Diabetes and Dyslipidemia. GERD.  Sonographer:    Logan Shove RDCS Referring Phys: [phone] [facility]  Sonographer Comments: Suboptimal parasternal window. Body Position. IMPRESSIONS  1. Left ventricular ejection fraction, by estimation, is 60 to 65%. The left ventricle has normal function. The left ventricle has no regional wall motion abnormalities. Left ventricular diastolic parameters were normal.  2. Right ventricular systolic function is normal. The right ventricular size is normal.  3. The mitral valve is normal in structure. Trivial mitral valve regurgitation. No evidence of mitral stenosis.  4. Rounded echodensity (6 mm X 8 mm) on the ventricular side of (predominantly) the right coronoary cusp leaflet. No abscess is appreciated. The aortic valve is abnormal. Aortic valve regurgitation is not visualized. Comparison(s):  No prior Echocardiogram. Conclusion(s)/Recommendation(s): In the clinical picture of bactermia, the study could be consistent with infective endocarditis. Consider TEE if clinically indicated and within goals of care. FINDINGS  Left Ventricle: Left ventricular ejection fraction, by estimation, is 60 to  65%. The left ventricle has normal function. The left ventricle has no regional wall motion abnormalities. Definity  contrast agent was given IV to delineate the left ventricular  endocardial borders. The left ventricular internal cavity size was normal in size. There is no left ventricular hypertrophy. Left ventricular diastolic parameters were normal. Right Ventricle: The right ventricular size is normal. No increase in right ventricular wall thickness. Right ventricular systolic function is normal. Left Atrium: Left atrial size was normal in size. Right Atrium: Right atrial size was normal in size. Pericardium: There is no evidence of pericardial effusion. Mitral Valve: The mitral valve is normal in structure. Trivial mitral valve regurgitation. No evidence of mitral valve stenosis. Tricuspid Valve: The tricuspid valve is normal in structure. Tricuspid valve regurgitation is not demonstrated. No evidence of tricuspid stenosis. Aortic Valve: Rounded echodensity (6 mm X 8 mm) on the ventricular side of (predominantly) the right coronoary cusp leaflet. No abscess is appreciated. The aortic valve is abnormal. Aortic valve regurgitation is not visualized. Aortic valve peak gradient  measures 10.5 mmHg. Pulmonic Valve: The pulmonic valve was normal in structure. Pulmonic valve regurgitation is trivial. No evidence of pulmonic stenosis. Aorta: The aortic root and ascending aorta are structurally normal, with no evidence of dilitation. IAS/Shunts: No atrial level shunt detected by color flow Doppler.  LEFT VENTRICLE PLAX 2D LVIDd:         4.70 cm      Diastology LVIDs:         3.10 cm      LV e' medial:    9.14 cm/s LV PW:          0.70 cm      LV E/e' medial:  9.6 LV IVS:        0.70 cm      LV e' lateral:   15.60 cm/s LVOT diam:     2.00 cm      LV E/e' lateral: 5.6 LVOT Area:     3.14 cm  LV Volumes (MOD) LV vol d, MOD A2C: 81.3 ml LV vol d, MOD A4C: 107.0 ml LV vol s, MOD A2C: 33.3 ml LV vol s, MOD A4C: 40.1 ml LV SV MOD A2C:     48.0 ml LV SV MOD A4C:     107.0 ml LV SV MOD BP:      55.8 ml RIGHT VENTRICLE             IVC RV Basal diam:  3.20 cm     IVC diam: 1.40 cm RV S prime:     14.10 cm/s TAPSE (M-mode): 1.8 cm      PULMONARY VEINS                             Diastolic Velocity: 31.20 cm/s                             S/D Velocity:       1.90                             Systolic Velocity:  59.70 cm/s LEFT ATRIUM             Index        RIGHT ATRIUM           Index LA diam:  3.00 cm 1.75 cm/m   RA Area:     10.40 cm LA Vol (A2C):   51.8 ml 30.22 ml/m  RA Volume:   20.10 ml  11.72 ml/m LA Vol (A4C):   31.9 ml 18.61 ml/m LA Biplane Vol: 40.6 ml 23.68 ml/m  AORTIC VALVE AV Area (Vmax): 2.27 cm AV Vmax:        162.00 cm/s AV Peak Grad:   10.5 mmHg LVOT Vmax:      117.00 cm/s  AORTA Ao Root diam: 2.00 cm Ao Asc diam:  2.70 cm MITRAL VALVE MV Area (PHT): 4.21 cm    SHUNTS MV Decel Time: 180 msec    Systemic Diam: 2.00 cm MV E velocity: 87.40 cm/s MV A velocity: 80.30 cm/s MV E/A ratio:  1.09 Stanly Leavens MD Electronically signed by Stanly Leavens MD Signature Date/Time: 08/12/2024/10:18:45 AM    Final    DG Chest Portable 1 View Result Date: 08/12/2024 EXAM: 1 VIEW(S) XRAY OF THE CHEST 08/12/2024 05:55:00 AM COMPARISON: 06/11/2024 CLINICAL HISTORY: sepsis FINDINGS: LINES, TUBES AND DEVICES: Left PICC line in place. LUNGS AND PLEURA: Hypoinflated lungs. Asymmetric elevation of the right hemidiaphragm, unchanged. No pleural effusion. No pneumothorax. HEART AND MEDIASTINUM: No acute abnormality of the cardiac and mediastinal silhouettes. BONES AND SOFT TISSUES: Degenerative changes are identified at the  glenohumeral joint. 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 IMPRESSION: 1. Hypoinflated lungs with asymmetric elevation of the right hemidiaphragm, unchanged. 2. Left PICC line in appropriate position. Electronically signed by: Waddell Calk MD 08/12/2024 06:13 AM EST RP Workstation: [id number]   CT ABDOMEN PELVIS W CONTRAST Result Date: 08/12/2024 EXAM: CT ABDOMEN AND PELVIS WITH CONTRAST 08/12/2024 04:11:48 AM TECHNIQUE: CT of the abdomen and pelvis was performed with the administration of 75 mL of iohexol  (OMNIPAQUE ) 350 MG/ML injection. Multiplanar reformatted images are provided for review. Automated exposure control, iterative reconstruction, and/or weight-based adjustment of the mA/kV was utilized to reduce the radiation dose to as low as reasonably achievable. COMPARISON: Findings are compared to 05/23/2024. CLINICAL HISTORY: Sepsis; sacral wound. FINDINGS: LOWER CHEST: Central venous catheter seen at the superior cavoatrial junction. Moderate multilevel coronary artery calcifications. LIVER: The liver is unremarkable. GALLBLADDER AND BILE DUCTS: Gallbladder is unremarkable. No biliary ductal dilatation. SPLEEN: No acute abnormality. PANCREAS: No acute abnormality. ADRENAL GLANDS: No acute abnormality. KIDNEYS, URETERS AND BLADDER: No stones in the kidneys or ureters. No hydronephrosis. No perinephric or periureteral stranding. Urinary bladder is unremarkable. GI AND BOWEL: Large stool burden without evidence of obstruction. The rectosigmoid colon, however, is relatively decompressed. Appendix normal. The stomach, small bowel, and large bowel are otherwise unremarkable. PERITONEUM AND RETROPERITONEUM: No ascites. No free air. VASCULATURE: Mild aortoiliac atherosclerotic calcification. No aortic aneurysm. LYMPH NODES: No lymphadenopathy. REPRODUCTIVE ORGANS: No acute abnormality. BONES AND SOFT TISSUES: Since the prior examination, a sacral  decubitus wound has developed, which approaches the cortex of the subjacent sacrum (75/3) in keeping with a grade 3-4 sacral decubitus wound. No osseous erosion to indicate superimposed osteomyelitis. 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 No acute bone abnormality. IMPRESSION: 1. 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 2. Large stool burden without evidence of obstruction, with relatively decompressed rectosigmoid colon. Electronically signed by: Dorethia Molt MD 08/12/2024 04:43 AM EST RP Workstation: [id number]     Discharge Exam: Vitals:   09/01/24 9161 09/01/24 1150  BP: (!) 138/96 111/79  Pulse: 93 (!) 110  Resp: 20 18  Temp: 97.7 F (36.5 C) 97.8 F (36.6 C)  SpO2: 98% 97%   Vitals:   08/31/24 2041 09/01/24 0037 09/01/24 0838 09/01/24 1150  BP: (!) 126/91 120/83 (!) 138/96 111/79  Pulse: (!) 110 (!) 103 93 (!) 110  Resp: 17 18 20 18   Temp: 98.6 F (37 C) 98.6 F (37 C) 97.7 F (36.5 C) 97.8 F (36.6 C)  TempSrc: Oral Oral Oral Oral  SpO2: 99% 97% 98% 97%  Weight:      Height:        General: Pt is alert, awake, not in acute distress Cardiovascular: RRR, S1/S2 +, no rubs, no gallops Respiratory: CTA bilaterally, no wheezing, no rhonchi Abdominal: Soft, NT, ND, bowel sounds + Extremities: No leg edema.  Spasticity.  Deconditioning overall.    The results of significant diagnostics from this hospitalization (including imaging, microbiology, ancillary and laboratory) are listed below for reference.     Microbiology: No results found for this or any previous visit (from the past 240 hours).   Labs: BNP (last 3 results) No results for input(s): BNP in the last 8760 hours. Basic Metabolic Panel: Recent Labs  Lab 08/27/24 0151  NA 134*  K 4.2  CL 99  CO2 20*  GLUCOSE 175*  BUN 6  CREATININE 0.37*  CALCIUM  9.7    Liver Function Tests: Recent Labs  Lab 08/27/24 0151  AST 18  ALT 7  ALKPHOS 88  BILITOT <0.2  PROT 7.8  ALBUMIN 3.3*   No results for input(s): LIPASE, AMYLASE in the last 168 hours. No results for input(s): AMMONIA in the last 168 hours. CBC: Recent Labs  Lab 08/27/24 0151 08/27/24 1326  WBC 12.4* 13.2*  NEUTROABS  --  8.1*  HGB 11.1* 10.3*  HCT 35.5* 33.4*  MCV 79.8* 81.3  PLT 399 343   Cardiac Enzymes: No results for input(s): CKTOTAL, CKMB, CKMBINDEX, TROPONINI in the last 168 hours. BNP: Invalid input(s): POCBNP CBG: Recent Labs  Lab 08/31/24 1602 08/31/24 2050 09/01/24 0838 09/01/24 1107 09/01/24 1150  GLUCAP 140* 130* 110* 155* 153*   D-Dimer No results for input(s): DDIMER in the last 72 hours. Hgb A1c Recent Labs    08/30/24 1147  HGBA1C 7.0*   Lipid Profile No results for input(s): CHOL, HDL, LDLCALC, TRIG, CHOLHDL, LDLDIRECT in the last 72 hours. Thyroid  function studies No results for input(s): TSH, T4TOTAL, T3FREE, THYROIDAB in the last 72 hours.  Invalid input(s): FREET3 Anemia work up No results for input(s): VITAMINB12, FOLATE, FERRITIN, TIBC, IRON, RETICCTPCT in the last 72 hours. Urinalysis    Component Value Date/Time   COLORURINE YELLOW 08/20/2024 0814   APPEARANCEUR CLEAR 08/20/2024 0814   LABSPEC 1.017 08/20/2024 0814   PHURINE 7.0 08/20/2024 0814   GLUCOSEU NEGATIVE 08/20/2024 0814   HGBUR NEGATIVE 08/20/2024 0814   HGBUR negative 01/01/2009 1022   BILIRUBINUR NEGATIVE 08/20/2024 0814   KETONESUR 5 (A) 08/20/2024 0814   PROTEINUR NEGATIVE 08/20/2024 0814   UROBILINOGEN 0.2 02/05/2015 2322   NITRITE NEGATIVE 08/20/2024 0814   LEUKOCYTESUR NEGATIVE 08/20/2024 0814   Sepsis Labs Recent Labs  Lab 08/27/24 0151 08/27/24 1326  WBC 12.4* 13.2*   Microbiology No results found for this or any previous visit (from the past 240 hours).  FURTHER DISCHARGE  INSTRUCTIONS:   Get Medicines reviewed and adjusted: Please take all your medications with you for your next visit with your Primary MD  Laboratory/radiological data: Please request your Primary MD to go over all hospital tests and procedure/radiological results at the follow up, please ask your Primary MD to get all Hospital records sent to his/her office.   In some cases, they will be blood work, cultures and biopsy results pending at the time of your discharge. Please request that your primary care M.D. goes through all the records of your hospital data and follows up on these results.   Also Note the following: If you experience worsening of your admission symptoms, develop shortness of breath, life threatening emergency, suicidal or homicidal thoughts you must seek medical attention immediately by calling 911 or calling your MD immediately  if symptoms less severe.   You must read complete instructions/literature along with all the possible adverse reactions/side effects for all the Medicines you take and that have been prescribed to you. Take any new Medicines after you have completely understood and accpet all the possible adverse reactions/side effects.    patient was instructed, not to drive, operate heavy machinery, perform activities at heights, swimming or participation in water  activities or provide baby-sitting services while on Pain, Sleep and Anxiety Medications; until their outpatient Physician has advised to do so again. Also recommended to not to take more than prescribed Pain, Sleep and Anxiety Medications.  It is not advisable to combine anxiety, sleep and pain medications without talking with your primary care provider.     Wear Seat belts while driving.   Please note: You were cared for by a hospitalist during your hospital stay. Once you are discharged, your primary care physician will handle any further medical issues. Please note that NO REFILLS for any discharge  medications will be authorized once you are discharged, as it is imperative that you return to your primary care physician (or establish a relationship with a primary care physician if you do not have one) for your post hospital discharge needs so that they can reassess your need for medications and monitor your lab values  Time coordinating discharge: Over 30 minutes  SIGNED:   Fredia Skeeter, MD  [facility] 09/01/2024, 12:15 PM *Please note that this is a verbal dictation therefore any spelling or grammatical errors are due to the Dragon Medical One system interpretation. If 7PM-7AM, please contact night-coverage [email or web address]     [1]  Allergies Allergen Reactions   Zosyn  [Piperacillin -Tazobactam In Dex] Rash    Petechial rash noted, ID was consulted   Haloperidol Lactate Other (See Comments)    Unknown reaction   Shrimp [Shellfish Allergy] Hives and Rash    All Shellfish per [facility]   Strawberry (Diagnostic) Rash

## 2024-09-01 NOTE — TOC Transition Note (Signed)
 Transition of Care [facility]) - Discharge Note   Patient Details  Name: Alexandria Harvey MRN: [id number] Date of Birth: 1980-12-12  [facility] CM/SW Contact:  Sherline Clack, LCSWA Phone Number: 09/01/2024, 2:41 PM   Clinical Narrative:     Patient will DC to: [facility] Anticipated DC date: 09/01/2024  Family notified: Mother and Legal Guardian Transport by: [place]   Per MD patient ready for DC to [facility]. RN to call report prior to discharge [phone]). RN, patient, patient's family, and facility notified of DC. Discharge Summary and FL2 sent to facility. DC packet on chart. Ambulance transport requested for patient.   CSW will sign off for now as social work intervention is no longer needed. Please consult us  again if new needs arise.    Final next level of care: Skilled Nursing Facility Barriers to Discharge: Barriers Resolved   Patient Goals and CMS Choice     Choice offered to / list presented to : [facility] POA / Guardian, Parent      Discharge Placement              Patient [facility] bed at: [facility] Patient to be transferred to facility by: [facility] Name of family member notified: Nat Duos and Glendale Pereyra Patient and family notified of of transfer: 09/01/24  Discharge Plan and Services Additional resources added to the After Visit Summary for                                       Social Drivers of Health (SDOH) Interventions SDOH Screenings   Food Insecurity: No Food Insecurity (06/11/2024)  Housing: Low Risk (06/11/2024)  Transportation Needs: No Transportation Needs (06/11/2024)  Utilities: Not At Risk (06/11/2024)  Financial Resource Strain: High Risk (02/29/2024)   Received from [facility]  Physical Activity: Unknown (02/29/2024)   Received from [facility]  Social Connections: Moderately Integrated (02/29/2024)   Received from [facility]  Stress: No Stress Concern Present (02/29/2024)   Received  from [facility]  Tobacco Use: Low Risk (08/15/2024)     Readmission Risk Interventions    06/13/2024    9:35 AM  Readmission Risk Prevention Plan  Transportation Screening Complete  PCP or Specialist Appt within 5-7 Days Complete  Home Care Screening Complete  Medication Review (RN CM) Complete

## 2024-09-01 NOTE — ED Notes (Signed)
 [facility] called

## 2024-11-21 ENCOUNTER — Ambulatory Visit (HOSPITAL_COMMUNITY)

## 2024-12-04 ENCOUNTER — Ambulatory Visit: Admitting: Emergency Medicine
